# Patient Record
Sex: Female | Born: 1940 | Race: White | Hispanic: No | Smoking: Never smoker
Health system: Southern US, Community
[De-identification: ages and names within clinical notes are randomized; demographics above are authoritative.]

## PROBLEM LIST (undated history)

## (undated) DIAGNOSIS — Z9889 Other specified postprocedural states: Secondary | ICD-10-CM

## (undated) DIAGNOSIS — E039 Hypothyroidism, unspecified: Secondary | ICD-10-CM

## (undated) DIAGNOSIS — Z531 Procedure and treatment not carried out because of patient's decision for reasons of belief and group pressure: Secondary | ICD-10-CM

## (undated) DIAGNOSIS — Z87440 Personal history of urinary (tract) infections: Secondary | ICD-10-CM

## (undated) DIAGNOSIS — M1990 Unspecified osteoarthritis, unspecified site: Secondary | ICD-10-CM

## (undated) DIAGNOSIS — R9431 Abnormal electrocardiogram [ECG] [EKG]: Secondary | ICD-10-CM

## (undated) DIAGNOSIS — R7303 Prediabetes: Secondary | ICD-10-CM

## (undated) DIAGNOSIS — J302 Other seasonal allergic rhinitis: Secondary | ICD-10-CM

## (undated) DIAGNOSIS — IMO0001 Reserved for inherently not codable concepts without codable children: Secondary | ICD-10-CM

## (undated) DIAGNOSIS — R109 Unspecified abdominal pain: Secondary | ICD-10-CM

## (undated) DIAGNOSIS — C4492 Squamous cell carcinoma of skin, unspecified: Secondary | ICD-10-CM

## (undated) DIAGNOSIS — R112 Nausea with vomiting, unspecified: Secondary | ICD-10-CM

## (undated) DIAGNOSIS — F411 Generalized anxiety disorder: Secondary | ICD-10-CM

## (undated) DIAGNOSIS — B029 Zoster without complications: Secondary | ICD-10-CM

## (undated) DIAGNOSIS — M8580 Other specified disorders of bone density and structure, unspecified site: Secondary | ICD-10-CM

## (undated) DIAGNOSIS — F329 Major depressive disorder, single episode, unspecified: Secondary | ICD-10-CM

## (undated) DIAGNOSIS — G43909 Migraine, unspecified, not intractable, without status migrainosus: Secondary | ICD-10-CM

## (undated) DIAGNOSIS — Z889 Allergy status to unspecified drugs, medicaments and biological substances status: Secondary | ICD-10-CM

## (undated) DIAGNOSIS — E559 Vitamin D deficiency, unspecified: Secondary | ICD-10-CM

## (undated) DIAGNOSIS — K219 Gastro-esophageal reflux disease without esophagitis: Secondary | ICD-10-CM

## (undated) DIAGNOSIS — M179 Osteoarthritis of knee, unspecified: Secondary | ICD-10-CM

## (undated) DIAGNOSIS — Z8701 Personal history of pneumonia (recurrent): Secondary | ICD-10-CM

## (undated) DIAGNOSIS — T7840XA Allergy, unspecified, initial encounter: Secondary | ICD-10-CM

## (undated) DIAGNOSIS — E785 Hyperlipidemia, unspecified: Secondary | ICD-10-CM

## (undated) DIAGNOSIS — D126 Benign neoplasm of colon, unspecified: Secondary | ICD-10-CM

## (undated) DIAGNOSIS — M1710 Unilateral primary osteoarthritis, unspecified knee: Secondary | ICD-10-CM

## (undated) DIAGNOSIS — F32A Depression, unspecified: Secondary | ICD-10-CM

## (undated) DIAGNOSIS — I880 Nonspecific mesenteric lymphadenitis: Secondary | ICD-10-CM

## (undated) HISTORY — DX: Nonspecific mesenteric lymphadenitis: I88.0

## (undated) HISTORY — DX: Generalized anxiety disorder: F41.1

## (undated) HISTORY — PX: BREAST ENHANCEMENT SURGERY: SHX7

## (undated) HISTORY — DX: Depression, unspecified: F32.A

## (undated) HISTORY — DX: Major depressive disorder, single episode, unspecified: F32.9

## (undated) HISTORY — DX: Squamous cell carcinoma of skin, unspecified: C44.92

## (undated) HISTORY — DX: Nausea with vomiting, unspecified: R11.2

## (undated) HISTORY — DX: Abnormal electrocardiogram (ECG) (EKG): R94.31

## (undated) HISTORY — DX: Osteoarthritis of knee, unspecified: M17.9

## (undated) HISTORY — DX: Benign neoplasm of colon, unspecified: D12.6

## (undated) HISTORY — DX: Zoster without complications: B02.9

## (undated) HISTORY — DX: Other specified disorders of bone density and structure, unspecified site: M85.80

## (undated) HISTORY — DX: Unspecified osteoarthritis, unspecified site: M19.90

## (undated) HISTORY — PX: EYE SURGERY: SHX253

## (undated) HISTORY — DX: Personal history of pneumonia (recurrent): Z87.01

## (undated) HISTORY — DX: Procedure and treatment not carried out because of patient's decision for reasons of belief and group pressure: Z53.1

## (undated) HISTORY — DX: Vitamin D deficiency, unspecified: E55.9

## (undated) HISTORY — DX: Unspecified abdominal pain: R10.9

## (undated) HISTORY — DX: Other specified postprocedural states: Z98.890

## (undated) HISTORY — DX: Hyperlipidemia, unspecified: E78.5

## (undated) HISTORY — DX: Unilateral primary osteoarthritis, unspecified knee: M17.10

## (undated) HISTORY — PX: AUGMENTATION MAMMAPLASTY: SUR837

## (undated) HISTORY — DX: Allergy, unspecified, initial encounter: T78.40XA

## (undated) HISTORY — DX: Migraine, unspecified, not intractable, without status migrainosus: G43.909

## (undated) HISTORY — DX: Other seasonal allergic rhinitis: J30.2

## (undated) HISTORY — DX: Personal history of urinary (tract) infections: Z87.440

---
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
## 1978-03-07 HISTORY — PX: PARTIAL HYSTERECTOMY: SHX80

## 2000-03-07 HISTORY — PX: COSMETIC SURGERY: SHX468

## 2005-03-07 HISTORY — PX: COLONOSCOPY: SHX174

## 2007-03-08 LAB — HM COLONOSCOPY: HM Colonoscopy: NORMAL

## 2008-06-05 HISTORY — PX: OTHER SURGICAL HISTORY: SHX169

## 2009-03-07 HISTORY — PX: MOHS SURGERY: SUR867

## 2010-06-22 LAB — VITAMIN D 25 HYDROXY (VIT D DEFICIENCY, FRACTURES): Vit D, 25-Hydroxy: 27.9

## 2011-03-08 HISTORY — PX: CATARACT EXTRACTION: SUR2

## 2011-03-08 HISTORY — PX: BUNIONECTOMY: SHX129

## 2011-03-09 DIAGNOSIS — J3089 Other allergic rhinitis: Secondary | ICD-10-CM | POA: Diagnosis not present

## 2011-03-11 DIAGNOSIS — J3089 Other allergic rhinitis: Secondary | ICD-10-CM | POA: Diagnosis not present

## 2011-03-15 DIAGNOSIS — M779 Enthesopathy, unspecified: Secondary | ICD-10-CM | POA: Diagnosis not present

## 2011-03-17 DIAGNOSIS — D239 Other benign neoplasm of skin, unspecified: Secondary | ICD-10-CM | POA: Diagnosis not present

## 2011-03-17 DIAGNOSIS — L570 Actinic keratosis: Secondary | ICD-10-CM | POA: Diagnosis not present

## 2011-03-17 DIAGNOSIS — L719 Rosacea, unspecified: Secondary | ICD-10-CM | POA: Diagnosis not present

## 2011-03-23 DIAGNOSIS — J309 Allergic rhinitis, unspecified: Secondary | ICD-10-CM | POA: Diagnosis not present

## 2011-03-23 DIAGNOSIS — H1045 Other chronic allergic conjunctivitis: Secondary | ICD-10-CM | POA: Diagnosis not present

## 2011-03-28 DIAGNOSIS — J3089 Other allergic rhinitis: Secondary | ICD-10-CM | POA: Diagnosis not present

## 2011-03-29 DIAGNOSIS — Z01818 Encounter for other preprocedural examination: Secondary | ICD-10-CM | POA: Diagnosis not present

## 2011-03-29 DIAGNOSIS — M2010 Hallux valgus (acquired), unspecified foot: Secondary | ICD-10-CM | POA: Diagnosis not present

## 2011-04-05 DIAGNOSIS — M2010 Hallux valgus (acquired), unspecified foot: Secondary | ICD-10-CM | POA: Diagnosis not present

## 2011-04-12 DIAGNOSIS — J309 Allergic rhinitis, unspecified: Secondary | ICD-10-CM | POA: Diagnosis not present

## 2011-04-14 DIAGNOSIS — J309 Allergic rhinitis, unspecified: Secondary | ICD-10-CM | POA: Diagnosis not present

## 2011-04-19 DIAGNOSIS — J309 Allergic rhinitis, unspecified: Secondary | ICD-10-CM | POA: Diagnosis not present

## 2011-04-21 DIAGNOSIS — J309 Allergic rhinitis, unspecified: Secondary | ICD-10-CM | POA: Diagnosis not present

## 2011-04-26 DIAGNOSIS — M2010 Hallux valgus (acquired), unspecified foot: Secondary | ICD-10-CM | POA: Diagnosis not present

## 2011-04-26 DIAGNOSIS — J309 Allergic rhinitis, unspecified: Secondary | ICD-10-CM | POA: Diagnosis not present

## 2011-04-28 DIAGNOSIS — J309 Allergic rhinitis, unspecified: Secondary | ICD-10-CM | POA: Diagnosis not present

## 2011-05-03 DIAGNOSIS — J309 Allergic rhinitis, unspecified: Secondary | ICD-10-CM | POA: Diagnosis not present

## 2011-05-06 DIAGNOSIS — J309 Allergic rhinitis, unspecified: Secondary | ICD-10-CM | POA: Diagnosis not present

## 2011-05-10 DIAGNOSIS — J309 Allergic rhinitis, unspecified: Secondary | ICD-10-CM | POA: Diagnosis not present

## 2011-05-12 DIAGNOSIS — J309 Allergic rhinitis, unspecified: Secondary | ICD-10-CM | POA: Diagnosis not present

## 2011-05-17 DIAGNOSIS — J309 Allergic rhinitis, unspecified: Secondary | ICD-10-CM | POA: Diagnosis not present

## 2011-05-17 DIAGNOSIS — M2010 Hallux valgus (acquired), unspecified foot: Secondary | ICD-10-CM | POA: Diagnosis not present

## 2011-05-19 DIAGNOSIS — J309 Allergic rhinitis, unspecified: Secondary | ICD-10-CM | POA: Diagnosis not present

## 2011-05-24 DIAGNOSIS — J309 Allergic rhinitis, unspecified: Secondary | ICD-10-CM | POA: Diagnosis not present

## 2011-06-24 DIAGNOSIS — Z79899 Other long term (current) drug therapy: Secondary | ICD-10-CM | POA: Diagnosis not present

## 2011-06-24 DIAGNOSIS — E7800 Pure hypercholesterolemia, unspecified: Secondary | ICD-10-CM | POA: Diagnosis not present

## 2011-06-24 DIAGNOSIS — M25569 Pain in unspecified knee: Secondary | ICD-10-CM | POA: Diagnosis not present

## 2011-06-24 DIAGNOSIS — F341 Dysthymic disorder: Secondary | ICD-10-CM | POA: Diagnosis not present

## 2011-06-24 DIAGNOSIS — M159 Polyosteoarthritis, unspecified: Secondary | ICD-10-CM | POA: Diagnosis not present

## 2011-06-24 LAB — CBC
WBC: 4.6
platelet count: 272

## 2011-06-24 LAB — COMPREHENSIVE METABOLIC PANEL
Alkaline Phosphatase: 146 U/L
Total Bilirubin: 0.5 mg/dL

## 2011-06-24 LAB — LIPID PANEL
Cholesterol: 282 mg/dL — AB (ref 0–200)
Direct LDL: 194
HDL: 82 mg/dL — AB (ref 35–70)
Triglyceride fasting, serum: 97

## 2011-06-28 DIAGNOSIS — J309 Allergic rhinitis, unspecified: Secondary | ICD-10-CM | POA: Diagnosis not present

## 2011-07-01 DIAGNOSIS — J309 Allergic rhinitis, unspecified: Secondary | ICD-10-CM | POA: Diagnosis not present

## 2011-07-05 DIAGNOSIS — M2010 Hallux valgus (acquired), unspecified foot: Secondary | ICD-10-CM | POA: Diagnosis not present

## 2011-07-05 DIAGNOSIS — Z01818 Encounter for other preprocedural examination: Secondary | ICD-10-CM | POA: Diagnosis not present

## 2011-07-12 DIAGNOSIS — J309 Allergic rhinitis, unspecified: Secondary | ICD-10-CM | POA: Diagnosis not present

## 2011-07-15 DIAGNOSIS — J309 Allergic rhinitis, unspecified: Secondary | ICD-10-CM | POA: Diagnosis not present

## 2011-07-19 DIAGNOSIS — J309 Allergic rhinitis, unspecified: Secondary | ICD-10-CM | POA: Diagnosis not present

## 2011-07-20 DIAGNOSIS — R269 Unspecified abnormalities of gait and mobility: Secondary | ICD-10-CM | POA: Diagnosis not present

## 2011-07-20 DIAGNOSIS — IMO0002 Reserved for concepts with insufficient information to code with codable children: Secondary | ICD-10-CM | POA: Diagnosis not present

## 2011-07-20 DIAGNOSIS — M25569 Pain in unspecified knee: Secondary | ICD-10-CM | POA: Diagnosis not present

## 2011-07-22 DIAGNOSIS — J309 Allergic rhinitis, unspecified: Secondary | ICD-10-CM | POA: Diagnosis not present

## 2011-07-28 DIAGNOSIS — J309 Allergic rhinitis, unspecified: Secondary | ICD-10-CM | POA: Diagnosis not present

## 2011-08-09 DIAGNOSIS — M2010 Hallux valgus (acquired), unspecified foot: Secondary | ICD-10-CM | POA: Diagnosis not present

## 2011-08-09 DIAGNOSIS — J309 Allergic rhinitis, unspecified: Secondary | ICD-10-CM | POA: Diagnosis not present

## 2011-08-12 DIAGNOSIS — J309 Allergic rhinitis, unspecified: Secondary | ICD-10-CM | POA: Diagnosis not present

## 2011-08-16 DIAGNOSIS — J309 Allergic rhinitis, unspecified: Secondary | ICD-10-CM | POA: Diagnosis not present

## 2011-08-18 DIAGNOSIS — J309 Allergic rhinitis, unspecified: Secondary | ICD-10-CM | POA: Diagnosis not present

## 2011-08-23 DIAGNOSIS — J309 Allergic rhinitis, unspecified: Secondary | ICD-10-CM | POA: Diagnosis not present

## 2011-08-24 DIAGNOSIS — IMO0002 Reserved for concepts with insufficient information to code with codable children: Secondary | ICD-10-CM | POA: Diagnosis not present

## 2011-08-29 ENCOUNTER — Ambulatory Visit: Payer: Self-pay | Admitting: Orthopedic Surgery

## 2011-08-29 DIAGNOSIS — M1710 Unilateral primary osteoarthritis, unspecified knee: Secondary | ICD-10-CM | POA: Diagnosis not present

## 2011-08-29 DIAGNOSIS — M25569 Pain in unspecified knee: Secondary | ICD-10-CM | POA: Diagnosis not present

## 2011-08-29 DIAGNOSIS — M25469 Effusion, unspecified knee: Secondary | ICD-10-CM | POA: Diagnosis not present

## 2011-08-29 DIAGNOSIS — M7120 Synovial cyst of popliteal space [Baker], unspecified knee: Secondary | ICD-10-CM | POA: Diagnosis not present

## 2011-09-01 DIAGNOSIS — R269 Unspecified abnormalities of gait and mobility: Secondary | ICD-10-CM | POA: Diagnosis not present

## 2011-09-01 DIAGNOSIS — J309 Allergic rhinitis, unspecified: Secondary | ICD-10-CM | POA: Diagnosis not present

## 2011-09-01 DIAGNOSIS — M1710 Unilateral primary osteoarthritis, unspecified knee: Secondary | ICD-10-CM | POA: Diagnosis not present

## 2011-09-09 DIAGNOSIS — J309 Allergic rhinitis, unspecified: Secondary | ICD-10-CM | POA: Diagnosis not present

## 2011-09-14 DIAGNOSIS — M1710 Unilateral primary osteoarthritis, unspecified knee: Secondary | ICD-10-CM | POA: Diagnosis not present

## 2011-09-15 DIAGNOSIS — J309 Allergic rhinitis, unspecified: Secondary | ICD-10-CM | POA: Diagnosis not present

## 2011-09-20 DIAGNOSIS — J309 Allergic rhinitis, unspecified: Secondary | ICD-10-CM | POA: Diagnosis not present

## 2011-09-21 DIAGNOSIS — M1710 Unilateral primary osteoarthritis, unspecified knee: Secondary | ICD-10-CM | POA: Diagnosis not present

## 2011-09-22 DIAGNOSIS — J309 Allergic rhinitis, unspecified: Secondary | ICD-10-CM | POA: Diagnosis not present

## 2011-09-22 DIAGNOSIS — J3089 Other allergic rhinitis: Secondary | ICD-10-CM | POA: Diagnosis not present

## 2011-09-22 DIAGNOSIS — H1045 Other chronic allergic conjunctivitis: Secondary | ICD-10-CM | POA: Diagnosis not present

## 2011-09-27 DIAGNOSIS — M779 Enthesopathy, unspecified: Secondary | ICD-10-CM | POA: Diagnosis not present

## 2011-09-28 DIAGNOSIS — M1710 Unilateral primary osteoarthritis, unspecified knee: Secondary | ICD-10-CM | POA: Diagnosis not present

## 2011-09-29 DIAGNOSIS — J309 Allergic rhinitis, unspecified: Secondary | ICD-10-CM | POA: Diagnosis not present

## 2011-10-18 DIAGNOSIS — R609 Edema, unspecified: Secondary | ICD-10-CM | POA: Diagnosis not present

## 2011-10-18 DIAGNOSIS — J309 Allergic rhinitis, unspecified: Secondary | ICD-10-CM | POA: Diagnosis not present

## 2011-10-19 DIAGNOSIS — H2510 Age-related nuclear cataract, unspecified eye: Secondary | ICD-10-CM | POA: Diagnosis not present

## 2011-10-19 DIAGNOSIS — M1710 Unilateral primary osteoarthritis, unspecified knee: Secondary | ICD-10-CM | POA: Diagnosis not present

## 2011-10-25 DIAGNOSIS — J309 Allergic rhinitis, unspecified: Secondary | ICD-10-CM | POA: Diagnosis not present

## 2011-10-28 DIAGNOSIS — D239 Other benign neoplasm of skin, unspecified: Secondary | ICD-10-CM | POA: Diagnosis not present

## 2011-10-28 DIAGNOSIS — L570 Actinic keratosis: Secondary | ICD-10-CM | POA: Diagnosis not present

## 2011-10-28 DIAGNOSIS — Z85828 Personal history of other malignant neoplasm of skin: Secondary | ICD-10-CM | POA: Diagnosis not present

## 2011-10-28 DIAGNOSIS — C44721 Squamous cell carcinoma of skin of unspecified lower limb, including hip: Secondary | ICD-10-CM | POA: Diagnosis not present

## 2011-10-28 DIAGNOSIS — L821 Other seborrheic keratosis: Secondary | ICD-10-CM | POA: Diagnosis not present

## 2011-11-04 DIAGNOSIS — H2510 Age-related nuclear cataract, unspecified eye: Secondary | ICD-10-CM | POA: Diagnosis not present

## 2011-11-08 DIAGNOSIS — J309 Allergic rhinitis, unspecified: Secondary | ICD-10-CM | POA: Diagnosis not present

## 2011-11-09 DIAGNOSIS — R635 Abnormal weight gain: Secondary | ICD-10-CM | POA: Diagnosis not present

## 2011-11-09 DIAGNOSIS — M1990 Unspecified osteoarthritis, unspecified site: Secondary | ICD-10-CM | POA: Diagnosis not present

## 2011-11-09 DIAGNOSIS — N760 Acute vaginitis: Secondary | ICD-10-CM | POA: Diagnosis not present

## 2011-11-09 LAB — T4, FREE: T4,Free (Direct): 0.86

## 2011-11-14 ENCOUNTER — Ambulatory Visit: Payer: Self-pay | Admitting: Ophthalmology

## 2011-11-14 DIAGNOSIS — Z01810 Encounter for preprocedural cardiovascular examination: Secondary | ICD-10-CM | POA: Diagnosis not present

## 2011-11-14 DIAGNOSIS — I499 Cardiac arrhythmia, unspecified: Secondary | ICD-10-CM

## 2011-11-14 DIAGNOSIS — H2510 Age-related nuclear cataract, unspecified eye: Secondary | ICD-10-CM | POA: Diagnosis not present

## 2011-11-15 DIAGNOSIS — J309 Allergic rhinitis, unspecified: Secondary | ICD-10-CM | POA: Diagnosis not present

## 2011-11-22 ENCOUNTER — Ambulatory Visit: Payer: Self-pay | Admitting: Ophthalmology

## 2011-11-22 DIAGNOSIS — Z85828 Personal history of other malignant neoplasm of skin: Secondary | ICD-10-CM | POA: Diagnosis not present

## 2011-11-22 DIAGNOSIS — H269 Unspecified cataract: Secondary | ICD-10-CM | POA: Diagnosis not present

## 2011-11-22 DIAGNOSIS — M1710 Unilateral primary osteoarthritis, unspecified knee: Secondary | ICD-10-CM | POA: Diagnosis not present

## 2011-11-22 DIAGNOSIS — Z9109 Other allergy status, other than to drugs and biological substances: Secondary | ICD-10-CM | POA: Diagnosis not present

## 2011-11-22 DIAGNOSIS — Z79899 Other long term (current) drug therapy: Secondary | ICD-10-CM | POA: Diagnosis not present

## 2011-11-22 DIAGNOSIS — G43909 Migraine, unspecified, not intractable, without status migrainosus: Secondary | ICD-10-CM | POA: Diagnosis not present

## 2011-11-22 DIAGNOSIS — H2510 Age-related nuclear cataract, unspecified eye: Secondary | ICD-10-CM | POA: Diagnosis not present

## 2011-11-22 DIAGNOSIS — I498 Other specified cardiac arrhythmias: Secondary | ICD-10-CM | POA: Diagnosis not present

## 2011-11-22 DIAGNOSIS — F329 Major depressive disorder, single episode, unspecified: Secondary | ICD-10-CM | POA: Diagnosis not present

## 2011-12-01 ENCOUNTER — Ambulatory Visit (INDEPENDENT_AMBULATORY_CARE_PROVIDER_SITE_OTHER): Payer: Medicare Other | Admitting: Family Medicine

## 2011-12-01 ENCOUNTER — Encounter: Payer: Self-pay | Admitting: Family Medicine

## 2011-12-01 VITALS — BP 122/86 | HR 78 | Temp 98.2°F | Ht 65.5 in | Wt 154.2 lb

## 2011-12-01 DIAGNOSIS — G43909 Migraine, unspecified, not intractable, without status migrainosus: Secondary | ICD-10-CM | POA: Insufficient documentation

## 2011-12-01 DIAGNOSIS — Z8249 Family history of ischemic heart disease and other diseases of the circulatory system: Secondary | ICD-10-CM

## 2011-12-01 DIAGNOSIS — E663 Overweight: Secondary | ICD-10-CM

## 2011-12-01 DIAGNOSIS — F329 Major depressive disorder, single episode, unspecified: Secondary | ICD-10-CM | POA: Insufficient documentation

## 2011-12-01 DIAGNOSIS — R9431 Abnormal electrocardiogram [ECG] [EKG]: Secondary | ICD-10-CM | POA: Diagnosis not present

## 2011-12-01 DIAGNOSIS — F32A Depression, unspecified: Secondary | ICD-10-CM

## 2011-12-01 DIAGNOSIS — M129 Arthropathy, unspecified: Secondary | ICD-10-CM

## 2011-12-01 DIAGNOSIS — F331 Major depressive disorder, recurrent, moderate: Secondary | ICD-10-CM | POA: Insufficient documentation

## 2011-12-01 DIAGNOSIS — Z23 Encounter for immunization: Secondary | ICD-10-CM | POA: Diagnosis not present

## 2011-12-01 DIAGNOSIS — M1990 Unspecified osteoarthritis, unspecified site: Secondary | ICD-10-CM

## 2011-12-01 DIAGNOSIS — F3289 Other specified depressive episodes: Secondary | ICD-10-CM

## 2011-12-01 LAB — BASIC METABOLIC PANEL
BUN: 15 mg/dL (ref 6–23)
CO2: 29 mEq/L (ref 19–32)
Chloride: 102 mEq/L (ref 96–112)
GFR: 82.28 mL/min (ref >60.00)
Glucose, Bld: 101 mg/dL — ABNORMAL HIGH (ref 70–99)
Potassium: 4 mEq/L (ref 3.5–5.1)
Sodium: 140 mEq/L (ref 135–145)

## 2011-12-01 LAB — LIPID PANEL: VLDL: 20.4 mg/dL (ref 0.0–40.0)

## 2011-12-01 NOTE — Progress Notes (Signed)
Subjective:    Patient ID: Kristin Kramer, female    DOB: [birth year], 71 y.o.   MRN: [id number]  HPI CC: new pt to establish  Recently moved from [facility].  Pt has sent fax requesting records from prior PCP.  Had prior EKG (about 2002).  Abnormal EKG recently done preop for cataract surgery.  Told needed f/u with PCP.  Scheduled to have R knee replacement 01/02/2012.  Severe osteoarthritis.  Ortho - Dr. Thurston Hole.  Wants abnl EKG addressed before then.  Prior to knee problem, did walk 57min-1hr 3x/wk.  Never SOB with exertion.  Was able to walk up 4 flights of stairs and did have some SOB at 4th flight.  Denies chest pain/tightness, SOB, current palpitations, HA or dizziness.  H/o depression - daughter died in car accident, husband died with lung cancer, son died in house fire, then both parents died.  This all happened in last 5-10 yrs.  Started on meds around this time.  Prior on lexapro, celexa, wellbutrin.  Recent skin cancer removed R leg.   Wt Readings from Last 3 Encounters:  12/01/11 154 lb 4 oz (69.967 kg)   Body mass index is 25.28 kg/(m^2).  Multiple surgeries in past.  Does get ill with general anesthesia - nausea/vomiting - but not major reaction in past.  No falls in last year. Denies anhedonia, depression, anxiety or sadness.  Lives alone.  Brother lives nearby. Occupation: retired, was Diplomatic Services operational officer and housewife Edu: 1 yr college Activity: limited by knee Diet: good water, fruits/vegetables daily  Preventative: Last medicare wellness visit 06/2011 mammo 2012 - normal Colonoscopy 2009 -  WNL, good for 10 yrs according to patient.  Medications and allergies reviewed and updated in chart.  Past histories reviewed and updated if relevant as below. There is no problem list on file for this patient.  Past Medical History  Diagnosis Date  . Seasonal allergies     cats, dust, mold, roaches  . Depression     prior on lexapro, celexa, wellbutrin  . Migraines   .  Squamous cell skin cancer   . Abnormal EKG    Past Surgical History  Procedure Date  . Tonsillectomy and adenoidectomy 1945  . Abdominal hysterectomy   . Bunionectomy 2013    R and L foot  . Cataract extraction 2013    R, pending L  . Cosmetic surgery 2002    face lift   History  Substance Use Topics  . Smoking status: Never Smoker   . Smokeless tobacco: Never Used  . Alcohol Use: Yes     occasional   Family History  Problem Relation Age of Onset  . Cancer Mother     lung, smoker  . Cancer Father     lung, smoker  . Cancer Maternal Grandmother 48    leukemia  . Coronary artery disease Paternal Grandmother 58    sudden cardiac death  . Diabetes Neg Hx   . Stroke Neg Hx    No Known Allergies Current Outpatient Prescriptions on File Prior to Visit  Medication Sig Dispense Refill  . calcium carbonate (OS-CAL) 600 MG TABS Take 600 mg by mouth 2 (two) times daily with a meal.      . DULoxetine (CYMBALTA) 20 MG capsule Take 20 mg by mouth daily.         Review of Systems  Constitutional: Negative for fever, chills, activity change, appetite change, fatigue and unexpected weight change.  HENT: Negative for hearing loss and neck  pain.   Eyes: Negative for visual disturbance.  Respiratory: Negative for cough, chest tightness, shortness of breath and wheezing.   Cardiovascular: Negative for chest pain, palpitations and leg swelling.  Gastrointestinal: Negative for nausea, vomiting, abdominal pain, diarrhea, constipation, blood in stool and abdominal distention.  Genitourinary: Negative for hematuria and difficulty urinating.  Musculoskeletal: Negative for myalgias and arthralgias.  Skin: Negative for rash.  Neurological: Negative for dizziness, seizures, syncope and headaches.  Hematological: Does not bruise/bleed easily.  Psychiatric/Behavioral: Negative for dysphoric mood. The patient is not nervous/anxious.        Objective:   Physical Exam  Nursing note and vitals  reviewed. Constitutional: She is oriented to person, place, and time. She appears well-developed and well-nourished. No distress.  HENT:  Head: Normocephalic and atraumatic.  Right Ear: Hearing, tympanic membrane, external ear and ear canal normal.  Left Ear: Hearing, tympanic membrane, external ear and ear canal normal.  Nose: Nose normal.  Mouth/Throat: Oropharynx is clear and moist. No oropharyngeal exudate.  Eyes: Conjunctivae normal and EOM are normal. Pupils are equal, round, and reactive to light. No scleral icterus.  Neck: Normal range of motion. Neck supple.  Cardiovascular: Normal rate, regular rhythm, normal heart sounds and intact distal pulses.   No murmur heard. Pulses:      Radial pulses are 2+ on the right side, and 2+ on the left side.  Pulmonary/Chest: Effort normal and breath sounds normal. No respiratory distress. She has no wheezes. She has no rales.  Abdominal: Soft. Bowel sounds are normal. She exhibits no distension and no mass. There is no tenderness. There is no rebound and no guarding.  Musculoskeletal: Normal range of motion. She exhibits no edema.  Lymphadenopathy:    She has no cervical adenopathy.  Neurological: She is alert and oriented to person, place, and time.       CN grossly intact, station and gait intact  Skin: Skin is warm and dry. No rash noted.  Psychiatric: She has a normal mood and affect. Her behavior is normal. Judgment and thought content normal.       Assessment & Plan:

## 2011-12-01 NOTE — Assessment & Plan Note (Signed)
Chronic, pending TKR.

## 2011-12-01 NOTE — Assessment & Plan Note (Signed)
Rechecked today and compared to prior EKG earlier this month. As persistently abnormal and pending R TKR, will refer to cards for further evaluation. Overall anticipate low risk.  EKG - NSR rate 70, LAD, normal intervals, no acute ST/T changes, LAFB

## 2011-12-01 NOTE — Assessment & Plan Note (Signed)
Chronic, stable on cymbalta

## 2011-12-01 NOTE — Patient Instructions (Addendum)
Flu and pneumonia shot today. Call your insurance about the shingles shot to see if it is covered or how much it would cost and where is cheaper (here or pharmacy).  If you want to receive here, call for nurse visit. EKG today - staying a bit abnormal.  Pass by Marion's office for referral to cardiologist for further evaluation prior to knee surgery. Blood work today.

## 2011-12-02 ENCOUNTER — Encounter: Payer: Self-pay | Admitting: *Deleted

## 2011-12-02 DIAGNOSIS — J309 Allergic rhinitis, unspecified: Secondary | ICD-10-CM | POA: Diagnosis not present

## 2011-12-08 DIAGNOSIS — J309 Allergic rhinitis, unspecified: Secondary | ICD-10-CM | POA: Diagnosis not present

## 2011-12-12 ENCOUNTER — Ambulatory Visit (INDEPENDENT_AMBULATORY_CARE_PROVIDER_SITE_OTHER): Payer: Medicare Other | Admitting: Cardiovascular Disease

## 2011-12-12 ENCOUNTER — Encounter: Payer: Self-pay | Admitting: Cardiovascular Disease

## 2011-12-12 VITALS — BP 118/82 | HR 78 | Ht 65.5 in | Wt 150.2 lb

## 2011-12-12 DIAGNOSIS — E785 Hyperlipidemia, unspecified: Secondary | ICD-10-CM | POA: Diagnosis not present

## 2011-12-12 DIAGNOSIS — R9431 Abnormal electrocardiogram [ECG] [EKG]: Secondary | ICD-10-CM | POA: Diagnosis not present

## 2011-12-12 NOTE — Patient Instructions (Addendum)
You are doing well. No medication changes were made.  Please stay on red yeast rice  Please call us if you have new issues that need to be addressed before your next appt.  Your physician wants you to follow-up in: 6 months.  You will receive a reminder letter in the mail two months in advance. If you don't receive a letter, please call our office to schedule the follow-up appointment.

## 2011-12-12 NOTE — Assessment & Plan Note (Signed)
EKG has nonspecific findings including right bundle branch block, incomplete, and left anterior fascicular block. Given her good exercise tolerance, no prior cardiac history, no prior workup is needed. She would be acceptable risk for upcoming surgical procedure, total knee replacement.

## 2011-12-12 NOTE — Assessment & Plan Note (Addendum)
We have discussed her cholesterol with her. 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 She has started red yeast rice on her own. We have suggested she recheck her cholesterol numbers in several months time. There are other alternatives than statins for cholesterol reduction if needed. These would include WelChol, zetia, or a fenofibrate.

## 2011-12-12 NOTE — Progress Notes (Signed)
Patient ID: Kristin Kramer, female    DOB: 03/23/1940, 71 y.o.   MRN: [id number]  HPI Comments: Kristin Kramer is a very pleasant 71 year old woman with a history of hyperlipidemia, prior foot surgery, osteoarthritis of her knees who is scheduled to have total knee replacement at [facility] on 01/02/2012. She presents for evaluation of her abnormal EKG and preoperative assessment.  She reports that she has no prior cardiac history. She denies any symptoms of shortness of breath or chest pain. She is active at baseline but limited by her recent knee pain. She reports that she has bone-on-bone. She denies any lightheadedness, dizziness, lower external edema. She is able to do all of her ADLs. She lives by herself and is very independent. Prior to her knee discomfort, she was exercising on a regular basis without symptoms.  Recent EKGs show normal sinus rhythm with incomplete right bundle branch block, left anterior fascicular block. This was seen several weeks ago and again today. Heart rate today 78 beats per minute  She reports that her father passed away at age 33 from lung cancer, mother at age 34 for lung cancer. They were both smokers   Outpatient Encounter Prescriptions as of 12/12/2011  Medication Sig Dispense Refill  . acetaminophen (TYLENOL) 325 MG tablet Take 650 mg by mouth as needed.      . calcium carbonate (OS-CAL) 600 MG TABS Take 600 mg by mouth 2 (two) times daily with a meal.      . Cholecalciferol (VITAMIN D-3) 5000 UNITS TABS Take 5,000 Units by mouth daily.      . DULoxetine (CYMBALTA) 20 MG capsule Take 20 mg by mouth daily.      Marland Kitchen LORazepam (ATIVAN) 0.5 MG tablet Take 0.5 mg by mouth at bedtime.      . traMADol (ULTRAM) 50 MG tablet Take 50 mg by mouth every 6 (six) hours as needed.         Review of Systems  Constitutional: Negative.   HENT: Negative.   Eyes: Negative.   Respiratory: Negative.   Cardiovascular: Negative.   Gastrointestinal: Negative.     Musculoskeletal: Positive for joint swelling and arthralgias.  Skin: Negative.   Neurological: Negative.   Hematological: Negative.   Psychiatric/Behavioral: Negative.   All other systems reviewed and are negative.    BP 118/82  Pulse 78  Ht 5' 5.5" (1.664 m)  Wt 150 lb 4 oz (68.153 kg)  BMI 24.62 kg/m2  Physical Exam  Nursing note and vitals reviewed. Constitutional: She is oriented to person, place, and time. She appears well-developed and well-nourished.  HENT:  Head: Normocephalic.  Nose: Nose normal.  Mouth/Throat: Oropharynx is clear and moist.  Eyes: Conjunctivae normal are normal. Pupils are equal, round, and reactive to light.  Neck: Normal range of motion. Neck supple. No JVD present.  Cardiovascular: Normal rate, regular rhythm, S1 normal, S2 normal, normal heart sounds and intact distal pulses.  Exam reveals no gallop and no friction rub.   No murmur heard. Pulmonary/Chest: Effort normal and breath sounds normal. No respiratory distress. She has no wheezes. She has no rales. She exhibits no tenderness.  Abdominal: Soft. Bowel sounds are normal. She exhibits no distension. There is no tenderness.  Musculoskeletal: Normal range of motion. She exhibits no edema and no tenderness.  Lymphadenopathy:    She has no cervical adenopathy.  Neurological: She is alert and oriented to person, place, and time. Coordination normal.  Skin: Skin is warm and dry. No rash noted.  No erythema.  Psychiatric: She has a normal mood and affect. Her behavior is normal. Judgment and thought content normal.         Assessment and Plan

## 2011-12-15 DIAGNOSIS — H2510 Age-related nuclear cataract, unspecified eye: Secondary | ICD-10-CM | POA: Diagnosis not present

## 2011-12-16 DIAGNOSIS — J309 Allergic rhinitis, unspecified: Secondary | ICD-10-CM | POA: Diagnosis not present

## 2011-12-19 ENCOUNTER — Encounter (HOSPITAL_COMMUNITY): Payer: Self-pay | Admitting: Pharmacy Technician

## 2011-12-20 ENCOUNTER — Ambulatory Visit: Payer: Self-pay | Admitting: Ophthalmology

## 2011-12-20 DIAGNOSIS — F329 Major depressive disorder, single episode, unspecified: Secondary | ICD-10-CM | POA: Diagnosis not present

## 2011-12-20 DIAGNOSIS — H269 Unspecified cataract: Secondary | ICD-10-CM | POA: Diagnosis not present

## 2011-12-20 DIAGNOSIS — M1710 Unilateral primary osteoarthritis, unspecified knee: Secondary | ICD-10-CM | POA: Diagnosis not present

## 2011-12-20 DIAGNOSIS — H2510 Age-related nuclear cataract, unspecified eye: Secondary | ICD-10-CM | POA: Diagnosis not present

## 2011-12-20 DIAGNOSIS — G43909 Migraine, unspecified, not intractable, without status migrainosus: Secondary | ICD-10-CM | POA: Diagnosis not present

## 2011-12-20 DIAGNOSIS — Z85828 Personal history of other malignant neoplasm of skin: Secondary | ICD-10-CM | POA: Diagnosis not present

## 2011-12-20 DIAGNOSIS — Z79899 Other long term (current) drug therapy: Secondary | ICD-10-CM | POA: Diagnosis not present

## 2011-12-21 ENCOUNTER — Encounter: Payer: Self-pay | Admitting: Physician Assistant

## 2011-12-21 ENCOUNTER — Other Ambulatory Visit: Payer: Self-pay | Admitting: Physician Assistant

## 2011-12-21 DIAGNOSIS — M1711 Unilateral primary osteoarthritis, right knee: Secondary | ICD-10-CM

## 2011-12-21 DIAGNOSIS — M1710 Unilateral primary osteoarthritis, unspecified knee: Secondary | ICD-10-CM | POA: Diagnosis not present

## 2011-12-21 DIAGNOSIS — C4492 Squamous cell carcinoma of skin, unspecified: Secondary | ICD-10-CM | POA: Insufficient documentation

## 2011-12-21 DIAGNOSIS — J302 Other seasonal allergic rhinitis: Secondary | ICD-10-CM

## 2011-12-21 DIAGNOSIS — R269 Unspecified abnormalities of gait and mobility: Secondary | ICD-10-CM | POA: Diagnosis not present

## 2011-12-21 NOTE — H&P (Signed)
TOTAL KNEE ADMISSION H&P  Patient is being admitted for right total knee arthroplasty.  Subjective:  Chief Complaint:right knee pain.  HPI: Kristin Kramer, 71 y.o. female, has a history of pain and functional disability in the right knee due to arthritis and has failed non-surgical conservative treatments for greater than 12 weeks to includeNSAID's and/or analgesics, corticosteriod injections, viscosupplementation injections, flexibility and strengthening excercises, use of assistive devices, weight reduction as appropriate and activity modification.  Onset of symptoms was gradual, starting 4 years ago with gradually worsening course since that time. The patient noted no past surgery on the right knee(s).  Patient currently rates pain in the right knee(s) at 7 out of 10 with activity. Patient has night pain, worsening of pain with activity and weight bearing, pain that interferes with activities of daily living, pain with passive range of motion, crepitus and joint swelling.  Patient has evidence of subchondral sclerosis, periarticular osteophytes and joint space narrowing by imaging studies. There is no active infection.  Patient Active Problem List   Diagnosis Date Noted  . Seasonal allergies   . Squamous cell skin cancer   . Right knee DJD   . Hyperlipidemia 12/12/2011  . Depression   . Migraines   . Abnormal EKG   . Arthritis    Past Medical History  Diagnosis Date  . Seasonal allergies     cats, dust, mold, roaches  . Depression     prior on lexapro, celexa, wellbutrin  . Migraines   . Squamous cell skin cancer   . Abnormal EKG   . Arthritis   . Right knee DJD     Past Surgical History  Procedure Date  . Tonsillectomy and adenoidectomy 1945  . Abdominal hysterectomy 1980  . Bunionectomy 2013    R and L foot  . Cataract extraction 2013    R, pending L  . Cosmetic surgery 2002    face lift     (Not in a hospital admission) No Known Allergies  History  Substance Use  Topics  . Smoking status: Never Smoker   . Smokeless tobacco: Never Used  . Alcohol Use: Yes     occasional    Family History  Problem Relation Age of Onset  . Cancer Mother     lung, smoker  . Cancer Father     lung, smoker  . Cancer Maternal Grandmother 48    leukemia  . Coronary artery disease Paternal Grandmother 7    sudden cardiac death  . Diabetes Neg Hx   . Stroke Neg Hx      Review of Systems  Constitutional: Negative.   HENT: Negative.   Eyes: Negative.   Respiratory: Negative.   Cardiovascular: Negative.   Gastrointestinal: Negative.   Genitourinary: Negative.   Musculoskeletal: Positive for joint pain.       Right knee  Skin: Negative.   Neurological: Negative.   Endo/Heme/Allergies: Negative.   Psychiatric/Behavioral: Negative.     Objective:  Physical Exam  Constitutional: She is oriented to person, place, and time. She appears well-developed and well-nourished.  HENT:  Head: Normocephalic and atraumatic.  Mouth/Throat: Oropharynx is clear and moist.  Eyes: EOM are normal. Pupils are equal, round, and reactive to light.  Neck: Neck supple.  Cardiovascular: Normal rate and regular rhythm.   Respiratory: Effort normal.  GI: Soft.  Genitourinary:       Not pertinent to current symptomatology therefore not examined.  Musculoskeletal:       Examination of her right  knee reveals 1+ effusion diffuse pain, 1+ crepitation, full range of motion knee is stable with normal patellar tracking. Exam of her left knee reveals full range of motion without pain swelling weakness or instability. Vascular exam: pulses 2+ and symmetric.   Neurological: She is alert and oriented to person, place, and time.  Skin: Skin is warm and dry.    Vital signs in last 24 hours: 5'5" 151 125/83 95% room air 98.1 Pulse 92  Labs:   Estimated Body mass index is 25.13 kg/(m^2) as calculated from the following:   Height as of this encounter: 5\' 5" (1.651 m).   Weight as of  this encounter: 151 lb(68.493 kg).   Imaging Review Plain radiographs demonstrate severe degenerative joint disease of the right knee(s). The overall alignment ismild varus. The bone quality appears to be good for age and reported activity level.  Assessment/Plan:  End stage arthritis, right knee   The patient history, physical examination, clinical judgment of the provider and imaging studies are consistent with end stage degenerative joint disease of the right knee(s) and total knee arthroplasty is deemed medically necessary. The treatment options including medical management, injection therapy arthroscopy and arthroplasty were discussed at length. The risks and benefits of total knee arthroplasty were presented and reviewed. The risks due to aseptic loosening, infection, stiffness, patella tracking problems, thromboembolic complications and other imponderables were discussed. The patient acknowledged the explanation, agreed to proceed with the plan and consent was signed. Patient is being admitted for inpatient treatment for surgery, pain control, PT, OT, prophylactic antibiotics, VTE prophylaxis, progressive ambulation and ADL's and discharge planning. The patient is planning to be discharged to skilled nursing facility  Kristin Kramer A. Gwinda Passe Physician Assistant [facility] [phone]  12/21/2011, 3:21 PM

## 2011-12-22 NOTE — Progress Notes (Signed)
Noted that patient did not have a CBC order as part of her pre-operative lab testing.  I spoke with Julien Girt, PA-C with Dr. Thurston Hole.  She is currently unable to enter the CBC with diff order.  CBC with diff ordered entered on behalf of Kirstin Shepperson PA-C/Dr. Thurston Hole as requested.  Shonna Chock, PA-C/Anesthesiology/Short Stay

## 2011-12-23 DIAGNOSIS — J309 Allergic rhinitis, unspecified: Secondary | ICD-10-CM | POA: Diagnosis not present

## 2011-12-23 NOTE — Pre-Procedure Instructions (Signed)
[address] Klyn Kroening  12/23/2011   Your procedure is scheduled on:  Mon, Oct 28 @ 11:15 AM  Report to [facility] at 9:15 AM.  Call this number if you have problems the morning of surgery: [phone]   Remember:   Do not eat food:After Midnight.    Take these medicines the morning of surgery with A SIP OF WATER: Cymbalta(Duloxetine),Lorazepam(Ativan),and Tramadol(Ultram-if needed)   Do not wear jewelry, make-up or nail polish.  Do not wear lotions, powders, or perfumes. You may wear deodorant.  Do not shave 48 hours prior to surgery.   Do not bring valuables to the hospital.  Contacts, dentures or bridgework may not be worn into surgery.  Leave suitcase in the car. After surgery it may be brought to your room.  For patients admitted to the hospital, checkout time is 11:00 AM the day of discharge.   Patients discharged the day of surgery will not be allowed to drive home.    Special Instructions: Shower using CHG 2 nights before surgery and the night before surgery.  If you shower the day of surgery use CHG.  Use special wash - you have one bottle of CHG for all showers.  You should use approximately 1/3 of the bottle for each shower.   Please read over the following fact sheets that you were given: Pain Booklet, Coughing and Deep Breathing, Blood Transfusion Information, MRSA Information and Surgical Site Infection Prevention

## 2011-12-26 ENCOUNTER — Encounter (HOSPITAL_COMMUNITY)
Admission: RE | Admit: 2011-12-26 | Discharge: 2011-12-26 | Payer: Medicare Other | Source: Ambulatory Visit | Attending: Orthopedic Surgery | Admitting: Orthopedic Surgery

## 2011-12-27 ENCOUNTER — Encounter (HOSPITAL_COMMUNITY): Payer: Self-pay

## 2011-12-27 ENCOUNTER — Encounter (HOSPITAL_COMMUNITY)
Admission: RE | Admit: 2011-12-27 | Discharge: 2011-12-27 | Disposition: A | Discharge status: Home / Self Care | Payer: Medicare Other | Source: Ambulatory Visit | Attending: Orthopedic Surgery | Admitting: Orthopedic Surgery

## 2011-12-27 ENCOUNTER — Encounter (HOSPITAL_COMMUNITY)
Admission: RE | Admit: 2011-12-27 | Discharge: 2011-12-27 | Disposition: A | Discharge status: Home / Self Care | Payer: Medicare Other | Source: Ambulatory Visit | Attending: Physician Assistant | Admitting: Physician Assistant

## 2011-12-27 DIAGNOSIS — Z01811 Encounter for preprocedural respiratory examination: Secondary | ICD-10-CM | POA: Diagnosis not present

## 2011-12-27 LAB — CBC
HCT: 42.1 % (ref 36.0–46.0)
Platelets: 231 10*3/uL (ref 150–400)
RBC: 4.75 MIL/uL (ref 3.87–5.11)
RDW: 12.4 % (ref 11.5–15.5)
WBC: 5.5 10*3/uL (ref 4.0–10.5)

## 2011-12-27 LAB — COMPREHENSIVE METABOLIC PANEL
AST: 24 U/L (ref 0–37)
Albumin: 4.2 g/dL (ref 3.5–5.2)
BUN: 15 mg/dL (ref 6–23)
Calcium: 9.8 mg/dL (ref 8.4–10.5)
Creatinine, Ser: 0.78 mg/dL (ref 0.50–1.10)
Total Bilirubin: 0.3 mg/dL (ref 0.3–1.2)
Total Protein: 7.6 g/dL (ref 6.0–8.3)

## 2011-12-27 LAB — URINE MICROSCOPIC-ADD ON

## 2011-12-27 LAB — URINALYSIS, ROUTINE W REFLEX MICROSCOPIC
Bilirubin Urine: NEGATIVE
Nitrite: NEGATIVE
Specific Gravity, Urine: 1.015 (ref 1.005–1.030)
Urobilinogen, UA: 0.2 mg/dL (ref 0.0–1.0)
pH: 8 (ref 5.0–8.0)

## 2011-12-27 LAB — PROTIME-INR
INR: 0.97 (ref 0.00–1.49)
Prothrombin Time: 12.8 seconds (ref 11.6–15.2)

## 2011-12-27 LAB — APTT: aPTT: 31 seconds (ref 24–37)

## 2011-12-27 LAB — DIFFERENTIAL
Basophils Absolute: 0 10*3/uL (ref 0.0–0.1)
Lymphocytes Relative: 32 % (ref 12–46)
Lymphs Abs: 1.8 10*3/uL (ref 0.7–4.0)
Monocytes Absolute: 0.5 10*3/uL (ref 0.1–1.0)
Neutro Abs: 2.8 10*3/uL (ref 1.7–7.7)

## 2011-12-27 LAB — SURGICAL PCR SCREEN: Staphylococcus aureus: NEGATIVE

## 2011-12-27 MED ORDER — CHLORHEXIDINE GLUCONATE 4 % EX LIQD: 60.0000 mL | Freq: Once | CUTANEOUS | Status: DC

## 2011-12-28 NOTE — Consult Note (Signed)
And Sanguinetti is a 71 year old female scheduled for right total knee replacement on 01/02/2012 by Dr. Salvatore Marvel.  Preop ECG showed an incomplete right bundle branch block and left anterior fascicular block. She was seen by Dr. Aquilla Hacker and found to be an acceptable risk for surgery. Good exercise tolerance, no cardiac history. And no significant cardiac risk factors.  Plan to proceed with surgery as scheduled.  Kipp Brood, M.D.

## 2011-12-29 LAB — URINE CULTURE

## 2011-12-30 DIAGNOSIS — J309 Allergic rhinitis, unspecified: Secondary | ICD-10-CM | POA: Diagnosis not present

## 2012-01-01 MED ORDER — CEFAZOLIN SODIUM-DEXTROSE 2-3 GM-% IV SOLR
2.0000 g | INTRAVENOUS | Status: AC
  Administered 2012-01-02: 2 g via INTRAVENOUS
  Filled 2012-01-01: qty 50

## 2012-01-01 MED ORDER — POVIDONE-IODINE 7.5 % EX SOLN
Freq: Once | CUTANEOUS | Status: DC
  Filled 2012-01-01: qty 118

## 2012-01-02 ENCOUNTER — Encounter (HOSPITAL_COMMUNITY): Payer: Self-pay | Admitting: Vascular Surgery

## 2012-01-02 ENCOUNTER — Inpatient Hospital Stay (HOSPITAL_COMMUNITY): Payer: Medicare Other | Admitting: Vascular Surgery

## 2012-01-02 ENCOUNTER — Encounter (HOSPITAL_COMMUNITY)
Admission: RE | Disposition: A | Discharge status: Skilled Nursing Facility | Payer: Self-pay | Source: Ambulatory Visit | Attending: Orthopedic Surgery

## 2012-01-02 ENCOUNTER — Inpatient Hospital Stay (HOSPITAL_COMMUNITY)
Admission: RE | Admit: 2012-01-02 | Discharge: 2012-01-05 | DRG: 470 | Disposition: A | Discharge status: Skilled Nursing Facility | Payer: Medicare Other | Source: Ambulatory Visit | Attending: Orthopedic Surgery | Admitting: Orthopedic Surgery

## 2012-01-02 ENCOUNTER — Encounter (HOSPITAL_COMMUNITY): Payer: Self-pay | Admitting: *Deleted

## 2012-01-02 DIAGNOSIS — K5900 Constipation, unspecified: Secondary | ICD-10-CM | POA: Diagnosis not present

## 2012-01-02 DIAGNOSIS — F3289 Other specified depressive episodes: Secondary | ICD-10-CM | POA: Diagnosis present

## 2012-01-02 DIAGNOSIS — J9819 Other pulmonary collapse: Secondary | ICD-10-CM | POA: Diagnosis not present

## 2012-01-02 DIAGNOSIS — A498 Other bacterial infections of unspecified site: Secondary | ICD-10-CM | POA: Diagnosis present

## 2012-01-02 DIAGNOSIS — Z79899 Other long term (current) drug therapy: Secondary | ICD-10-CM

## 2012-01-02 DIAGNOSIS — J302 Other seasonal allergic rhinitis: Secondary | ICD-10-CM | POA: Diagnosis present

## 2012-01-02 DIAGNOSIS — M1710 Unilateral primary osteoarthritis, unspecified knee: Secondary | ICD-10-CM | POA: Diagnosis not present

## 2012-01-02 DIAGNOSIS — K219 Gastro-esophageal reflux disease without esophagitis: Secondary | ICD-10-CM | POA: Diagnosis present

## 2012-01-02 DIAGNOSIS — M1711 Unilateral primary osteoarthritis, right knee: Secondary | ICD-10-CM

## 2012-01-02 DIAGNOSIS — R0602 Shortness of breath: Secondary | ICD-10-CM | POA: Diagnosis not present

## 2012-01-02 DIAGNOSIS — A499 Bacterial infection, unspecified: Secondary | ICD-10-CM

## 2012-01-02 DIAGNOSIS — E785 Hyperlipidemia, unspecified: Secondary | ICD-10-CM | POA: Diagnosis present

## 2012-01-02 DIAGNOSIS — M25569 Pain in unspecified knee: Secondary | ICD-10-CM | POA: Diagnosis not present

## 2012-01-02 DIAGNOSIS — F411 Generalized anxiety disorder: Secondary | ICD-10-CM | POA: Diagnosis present

## 2012-01-02 DIAGNOSIS — IMO0002 Reserved for concepts with insufficient information to code with codable children: Secondary | ICD-10-CM | POA: Diagnosis not present

## 2012-01-02 DIAGNOSIS — G43909 Migraine, unspecified, not intractable, without status migrainosus: Secondary | ICD-10-CM | POA: Diagnosis present

## 2012-01-02 DIAGNOSIS — F329 Major depressive disorder, single episode, unspecified: Secondary | ICD-10-CM | POA: Diagnosis present

## 2012-01-02 DIAGNOSIS — Z85828 Personal history of other malignant neoplasm of skin: Secondary | ICD-10-CM | POA: Diagnosis not present

## 2012-01-02 DIAGNOSIS — R0789 Other chest pain: Secondary | ICD-10-CM | POA: Diagnosis present

## 2012-01-02 DIAGNOSIS — G8918 Other acute postprocedural pain: Secondary | ICD-10-CM | POA: Diagnosis not present

## 2012-01-02 DIAGNOSIS — F331 Major depressive disorder, recurrent, moderate: Secondary | ICD-10-CM | POA: Diagnosis present

## 2012-01-02 DIAGNOSIS — R079 Chest pain, unspecified: Secondary | ICD-10-CM | POA: Diagnosis not present

## 2012-01-02 DIAGNOSIS — N390 Urinary tract infection, site not specified: Secondary | ICD-10-CM | POA: Diagnosis not present

## 2012-01-02 DIAGNOSIS — J90 Pleural effusion, not elsewhere classified: Secondary | ICD-10-CM | POA: Diagnosis not present

## 2012-01-02 DIAGNOSIS — R071 Chest pain on breathing: Secondary | ICD-10-CM | POA: Diagnosis not present

## 2012-01-02 DIAGNOSIS — Z5189 Encounter for other specified aftercare: Secondary | ICD-10-CM | POA: Diagnosis not present

## 2012-01-02 DIAGNOSIS — D62 Acute posthemorrhagic anemia: Secondary | ICD-10-CM | POA: Diagnosis not present

## 2012-01-02 DIAGNOSIS — Z01812 Encounter for preprocedural laboratory examination: Secondary | ICD-10-CM | POA: Diagnosis not present

## 2012-01-02 DIAGNOSIS — Z96659 Presence of unspecified artificial knee joint: Secondary | ICD-10-CM | POA: Diagnosis not present

## 2012-01-02 DIAGNOSIS — F419 Anxiety disorder, unspecified: Secondary | ICD-10-CM | POA: Diagnosis present

## 2012-01-02 DIAGNOSIS — S8990XA Unspecified injury of unspecified lower leg, initial encounter: Secondary | ICD-10-CM | POA: Diagnosis not present

## 2012-01-02 DIAGNOSIS — R072 Precordial pain: Secondary | ICD-10-CM | POA: Diagnosis not present

## 2012-01-02 HISTORY — DX: Reserved for inherently not codable concepts without codable children: IMO0001

## 2012-01-02 HISTORY — PX: TOTAL KNEE ARTHROPLASTY: SHX125

## 2012-01-02 HISTORY — DX: Procedure and treatment not carried out because of patient's decision for reasons of belief and group pressure: Z53.1

## 2012-01-02 SURGERY — ARTHROPLASTY, KNEE, TOTAL
Anesthesia: General | Site: Knee | Laterality: Right | Wound class: Clean

## 2012-01-02 MED ORDER — BISACODYL 5 MG PO TBEC
10.0000 mg | DELAYED_RELEASE_TABLET | Freq: Every day | ORAL | Status: DC
  Administered 2012-01-02 – 2012-01-03 (×2): 10 mg via ORAL
  Filled 2012-01-02 (×2): qty 2

## 2012-01-02 MED ORDER — POTASSIUM CHLORIDE IN NACL 20-0.9 MEQ/L-% IV SOLN
INTRAVENOUS | Status: DC
  Administered 2012-01-03 (×2): via INTRAVENOUS
  Filled 2012-01-02 (×8): qty 1000

## 2012-01-02 MED ORDER — ACETAMINOPHEN 650 MG RE SUPP: 650.0000 mg | Freq: Four times a day (QID) | RECTAL | Status: DC | PRN

## 2012-01-02 MED ORDER — BUPIVACAINE-EPINEPHRINE PF 0.25-1:200000 % IJ SOLN
INTRAMUSCULAR | Status: AC
  Filled 2012-01-02: qty 30

## 2012-01-02 MED ORDER — HYDROMORPHONE HCL PF 1 MG/ML IJ SOLN
0.2500 mg | INTRAMUSCULAR | Status: DC | PRN
  Administered 2012-01-02 (×4): 0.5 mg via INTRAVENOUS

## 2012-01-02 MED ORDER — LACTATED RINGERS IV SOLN: INTRAVENOUS | Status: DC

## 2012-01-02 MED ORDER — DEXAMETHASONE 4 MG PO TABS
10.0000 mg | ORAL_TABLET | Freq: Every day | ORAL | Status: AC
  Administered 2012-01-02 – 2012-01-04 (×3): 10 mg via ORAL
  Filled 2012-01-02 (×3): qty 1

## 2012-01-02 MED ORDER — ACETAMINOPHEN 10 MG/ML IV SOLN
INTRAVENOUS | Status: AC
  Filled 2012-01-02: qty 100

## 2012-01-02 MED ORDER — MIDAZOLAM HCL 2 MG/2ML IJ SOLN: 0.5000 mg | Freq: Once | INTRAMUSCULAR | Status: DC | PRN

## 2012-01-02 MED ORDER — PROMETHAZINE HCL 25 MG/ML IJ SOLN: 6.2500 mg | INTRAMUSCULAR | Status: DC | PRN

## 2012-01-02 MED ORDER — METOCLOPRAMIDE HCL 10 MG PO TABS: 5.0000 mg | ORAL_TABLET | Freq: Three times a day (TID) | ORAL | Status: DC | PRN

## 2012-01-02 MED ORDER — PHENOL 1.4 % MT LIQD: 1.0000 | OROMUCOSAL | Status: DC | PRN

## 2012-01-02 MED ORDER — MEPERIDINE HCL 25 MG/ML IJ SOLN: 6.2500 mg | INTRAMUSCULAR | Status: DC | PRN

## 2012-01-02 MED ORDER — LORAZEPAM 0.5 MG PO TABS
0.2500 mg | ORAL_TABLET | Freq: Four times a day (QID) | ORAL | Status: DC | PRN
  Administered 2012-01-03: 0.25 mg via ORAL
  Filled 2012-01-02: qty 1

## 2012-01-02 MED ORDER — CEFAZOLIN SODIUM-DEXTROSE 2-3 GM-% IV SOLR
2.0000 g | Freq: Four times a day (QID) | INTRAVENOUS | Status: AC
  Administered 2012-01-02 – 2012-01-03 (×2): 2 g via INTRAVENOUS
  Filled 2012-01-02 (×2): qty 50

## 2012-01-02 MED ORDER — SCOPOLAMINE 1 MG/3DAYS TD PT72
MEDICATED_PATCH | TRANSDERMAL | Status: AC
  Filled 2012-01-02: qty 1

## 2012-01-02 MED ORDER — HYDROMORPHONE HCL PF 1 MG/ML IJ SOLN
INTRAMUSCULAR | Status: AC
  Filled 2012-01-02: qty 1

## 2012-01-02 MED ORDER — LACTATED RINGERS IV SOLN
INTRAVENOUS | Status: DC | PRN
  Administered 2012-01-02 (×2): via INTRAVENOUS

## 2012-01-02 MED ORDER — DEXAMETHASONE SODIUM PHOSPHATE 4 MG/ML IJ SOLN
INTRAMUSCULAR | Status: DC | PRN
  Administered 2012-01-02: 8 mg via INTRAVENOUS

## 2012-01-02 MED ORDER — CALCIUM CARBONATE 1250 (500 CA) MG PO TABS
1250.0000 mg | ORAL_TABLET | Freq: Two times a day (BID) | ORAL | Status: DC
  Administered 2012-01-03 – 2012-01-05 (×5): 1250 mg via ORAL
  Filled 2012-01-02 (×8): qty 1

## 2012-01-02 MED ORDER — BUPIVACAINE-EPINEPHRINE PF 0.5-1:200000 % IJ SOLN
INTRAMUSCULAR | Status: DC | PRN
  Administered 2012-01-02: 30 mL

## 2012-01-02 MED ORDER — MENTHOL 3 MG MT LOZG: 1.0000 | LOZENGE | OROMUCOSAL | Status: DC | PRN

## 2012-01-02 MED ORDER — SODIUM CHLORIDE 0.9 % IR SOLN
Status: DC | PRN
  Administered 2012-01-02: 1000 mL
  Administered 2012-01-02: 3000 mL

## 2012-01-02 MED ORDER — ACETAMINOPHEN 325 MG PO TABS: 650.0000 mg | ORAL_TABLET | Freq: Four times a day (QID) | ORAL | Status: DC | PRN

## 2012-01-02 MED ORDER — MIDAZOLAM HCL 2 MG/2ML IJ SOLN
INTRAMUSCULAR | Status: AC
  Filled 2012-01-02: qty 2

## 2012-01-02 MED ORDER — VITAMIN D 1000 UNITS PO TABS
5000.0000 [IU] | ORAL_TABLET | Freq: Every day | ORAL | Status: DC
  Administered 2012-01-03 – 2012-01-05 (×3): 5000 [IU] via ORAL
  Filled 2012-01-02 (×4): qty 5

## 2012-01-02 MED ORDER — FENTANYL CITRATE 0.05 MG/ML IJ SOLN
INTRAMUSCULAR | Status: AC
  Filled 2012-01-02: qty 2

## 2012-01-02 MED ORDER — CELECOXIB 200 MG PO CAPS
200.0000 mg | ORAL_CAPSULE | Freq: Two times a day (BID) | ORAL | Status: DC
  Administered 2012-01-02 – 2012-01-05 (×6): 200 mg via ORAL
  Filled 2012-01-02 (×7): qty 1

## 2012-01-02 MED ORDER — LIDOCAINE HCL (CARDIAC) 20 MG/ML IV SOLN
INTRAVENOUS | Status: DC | PRN
  Administered 2012-01-02: 100 mg via INTRAVENOUS

## 2012-01-02 MED ORDER — METOCLOPRAMIDE HCL 5 MG/ML IJ SOLN: 5.0000 mg | Freq: Three times a day (TID) | INTRAMUSCULAR | Status: DC | PRN

## 2012-01-02 MED ORDER — CEFUROXIME SODIUM 1.5 G IJ SOLR
INTRAMUSCULAR | Status: DC | PRN
  Administered 2012-01-02: 1.5 g

## 2012-01-02 MED ORDER — MIDAZOLAM HCL 5 MG/5ML IJ SOLN
INTRAMUSCULAR | Status: DC | PRN
  Administered 2012-01-02: 2 mg via INTRAVENOUS

## 2012-01-02 MED ORDER — PROPOFOL 10 MG/ML IV BOLUS
INTRAVENOUS | Status: DC | PRN
  Administered 2012-01-02: 120 mg via INTRAVENOUS

## 2012-01-02 MED ORDER — DIPHENHYDRAMINE HCL 12.5 MG/5ML PO ELIX: 12.5000 mg | ORAL_SOLUTION | ORAL | Status: DC | PRN

## 2012-01-02 MED ORDER — LACTATED RINGERS IV SOLN
INTRAVENOUS | Status: DC
  Administered 2012-01-02: 12:00:00 via INTRAVENOUS

## 2012-01-02 MED ORDER — DOCUSATE SODIUM 100 MG PO CAPS
100.0000 mg | ORAL_CAPSULE | Freq: Two times a day (BID) | ORAL | Status: DC
  Administered 2012-01-02 – 2012-01-05 (×6): 100 mg via ORAL
  Filled 2012-01-02 (×7): qty 1

## 2012-01-02 MED ORDER — OXYCODONE HCL 5 MG PO TABS
5.0000 mg | ORAL_TABLET | ORAL | Status: DC | PRN
  Administered 2012-01-03 – 2012-01-05 (×10): 10 mg via ORAL
  Filled 2012-01-02 (×10): qty 2

## 2012-01-02 MED ORDER — ONDANSETRON HCL 4 MG/2ML IJ SOLN: 4.0000 mg | Freq: Four times a day (QID) | INTRAMUSCULAR | Status: DC | PRN

## 2012-01-02 MED ORDER — ACETAMINOPHEN 10 MG/ML IV SOLN
1000.0000 mg | Freq: Four times a day (QID) | INTRAVENOUS | Status: AC
  Administered 2012-01-02 – 2012-01-03 (×4): 1000 mg via INTRAVENOUS
  Filled 2012-01-02 (×4): qty 100

## 2012-01-02 MED ORDER — MIDAZOLAM HCL 2 MG/2ML IJ SOLN
1.0000 mg | INTRAMUSCULAR | Status: DC | PRN
  Administered 2012-01-02: 1 mg via INTRAVENOUS

## 2012-01-02 MED ORDER — DEXAMETHASONE SODIUM PHOSPHATE 10 MG/ML IJ SOLN
10.0000 mg | Freq: Every day | INTRAMUSCULAR | Status: AC
  Filled 2012-01-02 (×3): qty 1

## 2012-01-02 MED ORDER — ONDANSETRON HCL 4 MG PO TABS: 4.0000 mg | ORAL_TABLET | Freq: Four times a day (QID) | ORAL | Status: DC | PRN

## 2012-01-02 MED ORDER — ENOXAPARIN SODIUM 30 MG/0.3ML ~~LOC~~ SOLN
30.0000 mg | Freq: Two times a day (BID) | SUBCUTANEOUS | Status: DC
  Administered 2012-01-03 – 2012-01-05 (×5): 30 mg via SUBCUTANEOUS
  Filled 2012-01-02 (×7): qty 0.3

## 2012-01-02 MED ORDER — FENTANYL CITRATE 0.05 MG/ML IJ SOLN
50.0000 ug | Freq: Once | INTRAMUSCULAR | Status: AC
  Administered 2012-01-02: 50 ug via INTRAVENOUS

## 2012-01-02 MED ORDER — CEFUROXIME SODIUM 1.5 G IJ SOLR
INTRAMUSCULAR | Status: AC
  Filled 2012-01-02: qty 1.5

## 2012-01-02 MED ORDER — DULOXETINE HCL 20 MG PO CPEP
20.0000 mg | ORAL_CAPSULE | Freq: Every day | ORAL | Status: DC
Start: 2012-01-02 — End: 2012-01-05
  Administered 2012-01-03 – 2012-01-05 (×3): 20 mg via ORAL
  Filled 2012-01-02 (×4): qty 1

## 2012-01-02 MED ORDER — FENTANYL CITRATE 0.05 MG/ML IJ SOLN
INTRAMUSCULAR | Status: DC | PRN
  Administered 2012-01-02: 50 ug via INTRAVENOUS
  Administered 2012-01-02: 25 ug via INTRAVENOUS
  Administered 2012-01-02: 50 ug via INTRAVENOUS
  Administered 2012-01-02: 25 ug via INTRAVENOUS
  Administered 2012-01-02 (×2): 50 ug via INTRAVENOUS

## 2012-01-02 MED ORDER — HYDROMORPHONE HCL PF 1 MG/ML IJ SOLN: 0.5000 mg | INTRAMUSCULAR | Status: DC | PRN

## 2012-01-02 MED ORDER — OXYCODONE HCL 5 MG PO TABS: 5.0000 mg | ORAL_TABLET | Freq: Once | ORAL | Status: DC | PRN

## 2012-01-02 MED ORDER — OXYCODONE HCL 5 MG/5ML PO SOLN: 5.0000 mg | Freq: Once | ORAL | Status: DC | PRN

## 2012-01-02 MED ORDER — SCOPOLAMINE 1 MG/3DAYS TD PT72
MEDICATED_PATCH | TRANSDERMAL | Status: DC | PRN
  Administered 2012-01-02: 1 mg via TRANSDERMAL

## 2012-01-02 MED ORDER — BUPIVACAINE-EPINEPHRINE 0.25% -1:200000 IJ SOLN
INTRAMUSCULAR | Status: DC | PRN
  Administered 2012-01-02: 30 mL

## 2012-01-02 SURGICAL SUPPLY — 74 items
BANDAGE ESMARK 6X9 LF (GAUZE/BANDAGES/DRESSINGS) ×1 IMPLANT
BLADE SAGITTAL 25.0X1.19X90 (BLADE) ×2 IMPLANT
BLADE SAW SGTL 11.0X1.19X90.0M (BLADE) IMPLANT
BLADE SAW SGTL 13.0X1.19X90.0M (BLADE) ×2 IMPLANT
BLADE SURG 10 STRL SS (BLADE) ×4 IMPLANT
BNDG ELASTIC 6X10 VLCR STRL LF (GAUZE/BANDAGES/DRESSINGS) ×2 IMPLANT
BNDG ELASTIC 6X15 VLCR STRL LF (GAUZE/BANDAGES/DRESSINGS) ×2 IMPLANT
BNDG ESMARK 6X9 LF (GAUZE/BANDAGES/DRESSINGS) ×2
BOWL SMART MIX CTS (DISPOSABLE) ×2 IMPLANT
CEMENT HV SMART SET (Cement) ×4 IMPLANT
CEMENT TIBIA MBT SIZE 2.5 (Knees) ×1 IMPLANT
CLOTH BEACON ORANGE TIMEOUT ST (SAFETY) ×2 IMPLANT
CLSR STERI-STRIP ANTIMIC 1/2X4 (GAUZE/BANDAGES/DRESSINGS) ×2 IMPLANT
COVER BACK TABLE 24X17X13 BIG (DRAPES) IMPLANT
COVER PROBE W GEL 5X96 (DRAPES) IMPLANT
COVER SURGICAL LIGHT HANDLE (MISCELLANEOUS) ×2 IMPLANT
CUFF TOURNIQUET SINGLE 34IN LL (TOURNIQUET CUFF) ×2 IMPLANT
CUFF TOURNIQUET SINGLE 44IN (TOURNIQUET CUFF) IMPLANT
DRAPE EXTREMITY T 121X128X90 (DRAPE) ×2 IMPLANT
DRAPE INCISE IOBAN 66X45 STRL (DRAPES) ×2 IMPLANT
DRAPE PROXIMA HALF (DRAPES) ×2 IMPLANT
DRAPE U-SHAPE 47X51 STRL (DRAPES) ×2 IMPLANT
DRSG ADAPTIC 3X8 NADH LF (GAUZE/BANDAGES/DRESSINGS) ×2 IMPLANT
DRSG PAD ABDOMINAL 8X10 ST (GAUZE/BANDAGES/DRESSINGS) ×4 IMPLANT
DURAPREP 26ML APPLICATOR (WOUND CARE) ×2 IMPLANT
ELECT CAUTERY BLADE 6.4 (BLADE) ×2 IMPLANT
ELECT REM PT RETURN 9FT ADLT (ELECTROSURGICAL) ×2
ELECTRODE REM PT RTRN 9FT ADLT (ELECTROSURGICAL) ×1 IMPLANT
EVACUATOR 1/8 PVC DRAIN (DRAIN) ×2 IMPLANT
FACESHIELD LNG OPTICON STERILE (SAFETY) ×2 IMPLANT
FEMUR RIGHT SZ 3 (Knees) ×2 IMPLANT
GLOVE BIO SURGEON STRL SZ7 (GLOVE) ×2 IMPLANT
GLOVE BIOGEL PI IND STRL 7.0 (GLOVE) ×1 IMPLANT
GLOVE BIOGEL PI IND STRL 7.5 (GLOVE) ×1 IMPLANT
GLOVE BIOGEL PI INDICATOR 7.0 (GLOVE) ×1
GLOVE BIOGEL PI INDICATOR 7.5 (GLOVE) ×1
GLOVE SS BIOGEL STRL SZ 7.5 (GLOVE) ×1 IMPLANT
GLOVE SUPERSENSE BIOGEL SZ 7.5 (GLOVE) ×1
GOWN PREVENTION PLUS XLARGE (GOWN DISPOSABLE) ×4 IMPLANT
GOWN STRL NON-REIN LRG LVL3 (GOWN DISPOSABLE) ×4 IMPLANT
GOWN STRL REIN XL XLG (GOWN DISPOSABLE) ×2 IMPLANT
HANDPIECE INTERPULSE COAX TIP (DISPOSABLE) ×1
HOOD PEEL AWAY FACE SHEILD DIS (HOOD) ×6 IMPLANT
IMMOBILIZER KNEE 22 UNIV (SOFTGOODS) ×2 IMPLANT
INSERT CUSHION PRONEVIEW LG (MISCELLANEOUS) IMPLANT
INSERT TIBIAL PFC SIG SZ3 10MM (Knees) ×2 IMPLANT
KIT BASIN OR (CUSTOM PROCEDURE TRAY) ×2 IMPLANT
KIT ROOM TURNOVER OR (KITS) ×2 IMPLANT
MANIFOLD NEPTUNE II (INSTRUMENTS) ×2 IMPLANT
NS IRRIG 1000ML POUR BTL (IV SOLUTION) ×2 IMPLANT
PACK TOTAL JOINT (CUSTOM PROCEDURE TRAY) ×2 IMPLANT
PAD ARMBOARD 7.5X6 YLW CONV (MISCELLANEOUS) ×2 IMPLANT
PAD CAST 4YDX4 CTTN HI CHSV (CAST SUPPLIES) ×1 IMPLANT
PADDING CAST COTTON 4X4 STRL (CAST SUPPLIES) ×1
PADDING CAST COTTON 6X4 STRL (CAST SUPPLIES) ×2 IMPLANT
PATELLA DOME PFC 32MM (Knees) ×2 IMPLANT
POSITIONER HEAD PRONE TRACH (MISCELLANEOUS) ×2 IMPLANT
RUBBERBAND STERILE (MISCELLANEOUS) ×2 IMPLANT
SET HNDPC FAN SPRY TIP SCT (DISPOSABLE) ×1 IMPLANT
SPONGE GAUZE 4X4 12PLY (GAUZE/BANDAGES/DRESSINGS) ×2 IMPLANT
STRIP CLOSURE SKIN 1/2X4 (GAUZE/BANDAGES/DRESSINGS) IMPLANT
SUCTION FRAZIER TIP 10 FR DISP (SUCTIONS) ×2 IMPLANT
SUT ETHIBOND NAB CT1 #1 30IN (SUTURE) ×4 IMPLANT
SUT MNCRL AB 3-0 PS2 18 (SUTURE) ×2 IMPLANT
SUT VIC AB 0 CT1 27 (SUTURE) ×2
SUT VIC AB 0 CT1 27XBRD ANBCTR (SUTURE) ×2 IMPLANT
SUT VIC AB 2-0 CT1 27 (SUTURE) ×1
SUT VIC AB 2-0 CT1 TAPERPNT 27 (SUTURE) ×1 IMPLANT
SYR 30ML SLIP (SYRINGE) ×2 IMPLANT
TIBIA MBT CEMENT SIZE 2.5 (Knees) ×2 IMPLANT
TOWEL OR 17X24 6PK STRL BLUE (TOWEL DISPOSABLE) ×2 IMPLANT
TOWEL OR 17X26 10 PK STRL BLUE (TOWEL DISPOSABLE) ×2 IMPLANT
TRAY FOLEY CATH 14FR (SET/KITS/TRAYS/PACK) ×2 IMPLANT
WATER STERILE IRR 1000ML POUR (IV SOLUTION) ×4 IMPLANT

## 2012-01-02 NOTE — Transfer of Care (Signed)
Immediate Anesthesia Transfer of Care Note  Patient: Kristin Kramer  Procedure(s) Performed: Procedure(s) (LRB) with comments: TOTAL KNEE ARTHROPLASTY (Right) - right total knee replacement  Patient Location: PACU  Anesthesia Type:   Level of Consciousness: awake, alert  and oriented  Airway & Oxygen Therapy: Patient Spontanous Breathing  Post-op Assessment: Report given to PACU RN  Post vital signs: stable  Complications: No apparent anesthesia complications

## 2012-01-02 NOTE — Op Note (Signed)
MRN:     [id number] DOB/AGE:    11-Apr-1940 / 71 y.o.       OPERATIVE REPORT    DATE OF PROCEDURE:  01/02/2012       PREOPERATIVE DIAGNOSIS:   RIGHT KNEE DJD      There is no height or weight on file to calculate BMI.                                                        POSTOPERATIVE DIAGNOSIS:   RIGHT KNEE DJD                                                                      PROCEDURE:  Procedure(s): TOTAL KNEE ARTHROPLASTY Using Depuy Sigma RP implants #3 Femur, #2.5Tibia, 10mm sigma RP bearing, 32 Patella     SURGEON: Tyja Gortney A    ASSISTANT:  Kirstin Shepperson PA-C   (Present and scrubbed throughout the case, critical for assistance with exposure, retraction, instrumentation, and closure.)         ANESTHESIA: GET with Femoral Nerve Block  DRAINS: foley, 2 medium hemovac in knee   TOURNIQUET TIME:   COMPLICATIONS:  None     SPECIMENS: None   INDICATIONS FOR PROCEDURE: The patient has  RIGHT KNEE DJD, varus deformities, XR shows bone on bone arthritis. Patient has failed all conservative measures including anti-inflammatory medicines, narcotics, attempts at  exercise and weight loss, cortisone injections and viscosupplementation.  Risks and benefits of surgery have been discussed, questions answered.   DESCRIPTION OF PROCEDURE: The patient identified by armband, received  right femoral nerve block and IV antibiotics, in the holding area at [facility]. Patient taken to the operating room, appropriate anesthetic  monitors were attached General endotracheal anesthesia induced with  the patient in supine position, Foley catheter was inserted. Tourniquet  applied high to the operative thigh. Lateral post and foot positioner  applied to the table, the lower extremity was then prepped and draped  in usual sterile fashion from the ankle to the tourniquet. Time-out procedure was performed. The limb was wrapped with an Esmarch bandage and the tourniquet inflated to  365 mmHg. We began the operation by making the anterior midline incision starting at handbreadth above the patella going over the patella 1 cm medial to and  4 cm distal to the tibial tubercle. Small bleeders in the skin and the  subcutaneous tissue identified and cauterized. Transverse retinaculum was incised and reflected medially and a medial parapatellar arthrotomy was accomplished. the patella was everted and theprepatellar fat pad resected. The superficial medial collateral  ligament was then elevated from anterior to posterior along the proximal  flare of the tibia and anterior half of the menisci resected. The knee was hyperflexed exposing bone on bone arthritis. Peripheral and notch osteophytes as well as the cruciate ligaments were then resected. We continued to  work our way around posteriorly along the proximal tibia, and externally  rotated the tibia subluxing it out from underneath the femur. A McHale  retractor was placed through the notch and a lateral  Hohmann retractor  placed, and we then drilled through the proximal tibia in line with the  axis of the tibia followed by an intramedullary guide rod and 2-degree  posterior slope cutting guide. The tibial cutting guide was pinned into place  allowing resection of 4 mm of bone medially and about 6 mm of bone  laterally because of her varus deformity. Satisfied with the tibial resection, we then  entered the distal femur 2 mm anterior to the PCL origin with the  intramedullary guide rod and applied the distal femoral cutting guide  set at 11mm, with 5 degrees of valgus. This was pinned along the  epicondylar axis. At this point, the distal femoral cut was accomplished without difficulty. We then sized for a #3 femoral component and pinned the guide in 3 degrees of external rotation.The chamfer cutting guide was pinned into place. The anterior, posterior, and chamfer cuts were accomplished without difficulty followed by  the Sigma RP box  cutting guide and the box cut. We also removed posterior osteophytes from the posterior femoral condyles. At this  time, the knee was brought into full extension. We checked our  extension and flexion gaps and found them symmetric at 10mm.  The patella thickness measured at 22 mm. We set the cutting guide at 13 and removed the posterior 8-9 mm  of the patella sized for 32 button and drilled the lollipop. The knee  was then once again hyperflexed exposing the proximal tibia. We sized for a #2.5 tibial base plate, applied the smokestack and the conical reamer followed by the the Delta fin keel punch. We then hammered into place the Sigma RP trial femoral component, inserted a 10-mm trial bearing, trial patellar button, and took the knee through range of motion from 0-130 degrees. No thumb pressure was required for patellar  tracking. At this point, all trial components were removed, a double batch of DePuy HV cement with 1500 mg of Zinacef was mixed and applied to all bony metallic mating surfaces except for the posterior condyles of the femur itself. In order, we  hammered into place the tibial tray and removed excess cement, the femoral component and removed excess cement, a 10-mm Sigma RP bearing  was inserted, and the knee brought to full extension with compression.  The patellar button was clamped into place, and excess cement  removed. While the cement cured the wound was irrigated out with normal saline solution pulse lavage, and medium Hemovac drains were placed.. Ligament stability and patellar tracking were checked and found to be excellent. The tourniquet was then released and hemostasis was obtained with cautery. The parapatellar arthrotomy was closed with  #1 vicryl suture. The subcutaneous tissue with 0 and 2-0 undyed  Vicryl suture, and 4-0 Monocryl.. A dressing of Xeroform,  4 x 4, dressing sponges, Webril, and Ace wrap applied. Needle and sponge count were correct times 2.The patient  awakened, extubated, and taken to recovery room without difficulty. Vascular status was normal, pulses 2+ and symmetric.   Joylyn Duggin A 01/02/2012, 2:20 PM

## 2012-01-02 NOTE — Progress Notes (Signed)
Orthopedic Tech Progress Note Patient Details:  Kristin Kramer [birth year] [id number]  CPM Right Knee CPM Right Knee: On Right Knee Flexion (Degrees): 60  Right Knee Extension (Degrees): 0  Additional Comments: trapeze bar   Cammer, Mickie Bail 01/02/2012, 4:02 PM

## 2012-01-02 NOTE — Progress Notes (Signed)
Patient states she has been taking cipro for a bladder infection.

## 2012-01-02 NOTE — H&P (View-Only) (Signed)
TOTAL KNEE ADMISSION H&P  Patient is being admitted for right total knee arthroplasty.  Subjective:  Chief Complaint:right knee pain.  HPI: Kristin Kramer, 71 y.o. female, has a history of pain and functional disability in the right knee due to arthritis and has failed non-surgical conservative treatments for greater than 12 weeks to includeNSAID's and/or analgesics, corticosteriod injections, viscosupplementation injections, flexibility and strengthening excercises, use of assistive devices, weight reduction as appropriate and activity modification.  Onset of symptoms was gradual, starting 4 years ago with gradually worsening course since that time. The patient noted no past surgery on the right knee(s).  Patient currently rates pain in the right knee(s) at 7 out of 10 with activity. Patient has night pain, worsening of pain with activity and weight bearing, pain that interferes with activities of daily living, pain with passive range of motion, crepitus and joint swelling.  Patient has evidence of subchondral sclerosis, periarticular osteophytes and joint space narrowing by imaging studies. There is no active infection.  Patient Active Problem List   Diagnosis Date Noted  . Seasonal allergies   . Squamous cell skin cancer   . Right knee DJD   . Hyperlipidemia 12/12/2011  . Depression   . Migraines   . Abnormal EKG   . Arthritis    Past Medical History  Diagnosis Date  . Seasonal allergies     cats, dust, mold, roaches  . Depression     prior on lexapro, celexa, wellbutrin  . Migraines   . Squamous cell skin cancer   . Abnormal EKG   . Arthritis   . Right knee DJD     Past Surgical History  Procedure Date  . Tonsillectomy and adenoidectomy 1945  . Abdominal hysterectomy 1980  . Bunionectomy 2013    R and L foot  . Cataract extraction 2013    R, pending L  . Cosmetic surgery 2002    face lift     (Not in a hospital admission) No Known Allergies  History  Substance Use  Topics  . Smoking status: Never Smoker   . Smokeless tobacco: Never Used  . Alcohol Use: Yes     occasional    Family History  Problem Relation Age of Onset  . Cancer Mother     lung, smoker  . Cancer Father     lung, smoker  . Cancer Maternal Grandmother 48    leukemia  . Coronary artery disease Paternal Grandmother 70    sudden cardiac death  . Diabetes Neg Hx   . Stroke Neg Hx      Review of Systems  Constitutional: Negative.   HENT: Negative.   Eyes: Negative.   Respiratory: Negative.   Cardiovascular: Negative.   Gastrointestinal: Negative.   Genitourinary: Negative.   Musculoskeletal: Positive for joint pain.       Right knee  Skin: Negative.   Neurological: Negative.   Endo/Heme/Allergies: Negative.   Psychiatric/Behavioral: Negative.     Objective:  Physical Exam  Constitutional: She is oriented to person, place, and time. She appears well-developed and well-nourished.  HENT:  Head: Normocephalic and atraumatic.  Mouth/Throat: Oropharynx is clear and moist.  Eyes: EOM are normal. Pupils are equal, round, and reactive to light.  Neck: Neck supple.  Cardiovascular: Normal rate and regular rhythm.   Respiratory: Effort normal.  GI: Soft.  Genitourinary:       Not pertinent to current symptomatology therefore not examined.  Musculoskeletal:       Examination of her right   knee reveals 1+ effusion diffuse pain, 1+ crepitation, full range of motion knee is stable with normal patellar tracking. Exam of her left knee reveals full range of motion without pain swelling weakness or instability. Vascular exam: pulses 2+ and symmetric.   Neurological: She is alert and oriented to person, place, and time.  Skin: Skin is warm and dry.    Vital signs in last 24 hours: 5'5" 151 125/83 95% room air 98.1 Pulse 92  Labs:   Estimated Body mass index is 25.13 kg/(m^2) as calculated from the following:   Height as of this encounter: 5' 5"(1.651 m).   Weight as of  this encounter: 151 lb(68.493 kg).   Imaging Review Plain radiographs demonstrate severe degenerative joint disease of the right knee(s). The overall alignment ismild varus. The bone quality appears to be good for age and reported activity level.  Assessment/Plan:  End stage arthritis, right knee   The patient history, physical examination, clinical judgment of the provider and imaging studies are consistent with end stage degenerative joint disease of the right knee(s) and total knee arthroplasty is deemed medically necessary. The treatment options including medical management, injection therapy arthroscopy and arthroplasty were discussed at length. The risks and benefits of total knee arthroplasty were presented and reviewed. The risks due to aseptic loosening, infection, stiffness, patella tracking problems, thromboembolic complications and other imponderables were discussed. The patient acknowledged the explanation, agreed to proceed with the plan and consent was signed. Patient is being admitted for inpatient treatment for surgery, pain control, PT, OT, prophylactic antibiotics, VTE prophylaxis, progressive ambulation and ADL's and discharge planning. The patient is planning to be discharged to skilled nursing facility  Kristin Monty A. Jenasis Straley, PA-C Physician Assistant [facility] [phone]  12/21/2011, 3:21 PM 

## 2012-01-02 NOTE — Interval H&P Note (Signed)
History and Physical Interval Note:  01/02/2012 12:05 PM  Kristin Kramer  has presented today for surgery, with the diagnosis of RIGHT KNEE DJD  The various methods of treatment have been discussed with the patient and family. After consideration of risks, benefits and other options for treatment, the patient has consented to  Procedure(s) (LRB) with comments: TOTAL KNEE ARTHROPLASTY (Right) as a surgical intervention .  The patient's history has been reviewed, patient examined, no change in status, stable for surgery.  I have reviewed the patient's chart and labs.  Questions were answered to the patient's satisfaction.     Salvatore Marvel A

## 2012-01-02 NOTE — Anesthesia Preprocedure Evaluation (Addendum)
Anesthesia Evaluation  Patient identified by MRN, date of birth, ID band Patient awake    Reviewed: Allergy & Precautions, H&P , NPO status , Patient's Chart, lab work & pertinent test results  History of Anesthesia Complications (+) PONV  Airway Mallampati: II TM Distance: >3 FB Neck ROM: full    Dental  (+) Teeth Intact and Dental Advidsory Given   Pulmonary          Cardiovascular     Neuro/Psych  Headaches, Depression    GI/Hepatic   Endo/Other    Renal/GU      Musculoskeletal   Abdominal   Peds  Hematology   Anesthesia Other Findings   Reproductive/Obstetrics                           Anesthesia Physical Anesthesia Plan  ASA: II  Anesthesia Plan: General   Post-op Pain Management:    Induction: Intravenous  Airway Management Planned: LMA  Additional Equipment:   Intra-op Plan:   Post-operative Plan: Extubation in OR  Informed Consent: I have reviewed the patients History and Physical, chart, labs and discussed the procedure including the risks, benefits and alternatives for the proposed anesthesia with the patient or authorized representative who has indicated his/her understanding and acceptance.     Plan Discussed with: CRNA and Surgeon  Anesthesia Plan Comments:        Anesthesia Quick Evaluation

## 2012-01-02 NOTE — Preoperative (Signed)
Beta Blockers   Reason not to administer Beta Blockers:Not Applicable 

## 2012-01-02 NOTE — Anesthesia Postprocedure Evaluation (Signed)
  Anesthesia Post-op Note  Patient: Kristin Kramer  Procedure(s) Performed: Procedure(s) (LRB) with comments: TOTAL KNEE ARTHROPLASTY (Right) - right total knee replacement  Patient Location: PACU  Anesthesia Type: No value filed.   Level of Consciousness: awake, alert , oriented and patient cooperative  Airway and Oxygen Therapy: Patient Spontanous Breathing and Patient connected to nasal cannula oxygen  Post-op Pain: mild  Post-op Assessment: Post-op Vital signs reviewed, Patient's Cardiovascular Status Stable, Respiratory Function Stable, Patent Airway, No signs of Nausea or vomiting and Pain level controlled  Post-op Vital Signs: Reviewed and stable  Complications: No apparent anesthesia complications

## 2012-01-02 NOTE — Anesthesia Procedure Notes (Addendum)
Procedure Name: LMA Insertion Date/Time: 01/02/2012 1:05 PM Performed by: Rossie Muskrat L Pre-anesthesia Checklist: Patient identified, Timeout performed, Emergency Drugs available, Suction available and Patient being monitored Patient Re-evaluated:Patient Re-evaluated prior to inductionOxygen Delivery Method: Circle system utilized Preoxygenation: Pre-oxygenation with 100% oxygen Intubation Type: IV induction Ventilation: Mask ventilation without difficulty LMA: LMA inserted LMA Size: 4.0 Number of attempts: 1 Placement Confirmation: breath sounds checked- equal and bilateral and positive ETCO2 Tube secured with: Tape Dental Injury: Teeth and Oropharynx as per pre-operative assessment    Anesthesia Regional Block:  Femoral nerve block  Pre-Anesthetic Checklist: ,, timeout performed, Correct Patient, Correct Site, Correct Laterality, Correct Procedure, Correct Position, site marked, Risks and benefits discussed,  Surgical consent,  Pre-op evaluation,  At surgeon's request and post-op pain management  Laterality: Right  Prep: chloraprep       Needles:  Injection technique: Single-shot  Needle Type: Echogenic Stimulator Needle     Needle Length:cm 9 cm Needle Gauge: 21 G    Additional Needles:  Procedures: ultrasound guided and nerve stimulator Femoral nerve block  Nerve Stimulator or Paresthesia:  Response: 0.4 mA,   Additional Responses:   Narrative:  Start time: 01/02/2012 12:10 PM End time: 01/02/2012 12:25 PM Injection made incrementally with aspirations every 5 mL.  Performed by: Personally  Anesthesiologist: Arta Bruce MD  Additional Notes: Monitors applied. Patient sedated. Sterile prep and drape,hand hygiene and sterile gloves were used. Relevant anatomy identified.Needle position confirmed.Local anesthetic injected incrementally after negative aspiration. Local anesthetic spread visualized around nerve(s). Vascular puncture avoided. No complications.  Image printed for medical record.The patient tolerated the procedure well.       Femoral nerve block

## 2012-01-02 NOTE — Progress Notes (Signed)
Patient states that Dr. Thurston Hole is aware she is a Jehovah's Witness and refuses blood.

## 2012-01-03 ENCOUNTER — Inpatient Hospital Stay (HOSPITAL_COMMUNITY): Payer: Medicare Other

## 2012-01-03 ENCOUNTER — Encounter: Payer: Self-pay | Admitting: Family Medicine

## 2012-01-03 DIAGNOSIS — Z79899 Other long term (current) drug therapy: Secondary | ICD-10-CM | POA: Diagnosis not present

## 2012-01-03 DIAGNOSIS — N390 Urinary tract infection, site not specified: Secondary | ICD-10-CM | POA: Diagnosis not present

## 2012-01-03 DIAGNOSIS — R072 Precordial pain: Secondary | ICD-10-CM

## 2012-01-03 DIAGNOSIS — M1710 Unilateral primary osteoarthritis, unspecified knee: Secondary | ICD-10-CM | POA: Diagnosis not present

## 2012-01-03 DIAGNOSIS — A499 Bacterial infection, unspecified: Secondary | ICD-10-CM | POA: Diagnosis present

## 2012-01-03 DIAGNOSIS — R079 Chest pain, unspecified: Secondary | ICD-10-CM

## 2012-01-03 DIAGNOSIS — R0602 Shortness of breath: Secondary | ICD-10-CM | POA: Diagnosis not present

## 2012-01-03 DIAGNOSIS — D62 Acute posthemorrhagic anemia: Secondary | ICD-10-CM | POA: Diagnosis not present

## 2012-01-03 DIAGNOSIS — R071 Chest pain on breathing: Secondary | ICD-10-CM | POA: Diagnosis present

## 2012-01-03 LAB — CBC
HCT: 34.9 % — ABNORMAL LOW (ref 36.0–46.0)
Hemoglobin: 11.5 g/dL — ABNORMAL LOW (ref 12.0–15.0)
RBC: 3.98 MIL/uL (ref 3.87–5.11)
WBC: 10.3 10*3/uL (ref 4.0–10.5)

## 2012-01-03 LAB — TROPONIN I
Troponin I: <0.3 ng/mL (ref <0.30)
Troponin I: <0.3 ng/mL (ref <0.30)

## 2012-01-03 LAB — CK TOTAL AND CKMB (NOT AT ARMC)
CK, MB: 3.1 ng/mL (ref 0.3–4.0)
Relative Index: 0.9 (ref 0.0–2.5)
Total CK: 469 U/L — ABNORMAL HIGH (ref 7–177)

## 2012-01-03 LAB — BASIC METABOLIC PANEL
BUN: 9 mg/dL (ref 6–23)
Chloride: 102 mEq/L (ref 96–112)
GFR calc Af Amer: >90 mL/min (ref >90)
Glucose, Bld: 167 mg/dL — ABNORMAL HIGH (ref 70–99)
Potassium: 4.1 mEq/L (ref 3.5–5.1)

## 2012-01-03 MED ORDER — ASPIRIN EC 81 MG PO TBEC
81.0000 mg | DELAYED_RELEASE_TABLET | Freq: Every day | ORAL | Status: DC
  Administered 2012-01-03 – 2012-01-05 (×3): 81 mg via ORAL
  Filled 2012-01-03 (×3): qty 1

## 2012-01-03 MED ORDER — CIPROFLOXACIN HCL 500 MG PO TABS
500.0000 mg | ORAL_TABLET | Freq: Two times a day (BID) | ORAL | Status: DC
  Administered 2012-01-03 – 2012-01-05 (×5): 500 mg via ORAL
  Filled 2012-01-03 (×7): qty 1

## 2012-01-03 MED ORDER — LORAZEPAM 0.5 MG PO TABS
0.2500 mg | ORAL_TABLET | Freq: Every evening | ORAL | Status: DC | PRN
  Administered 2012-01-03 – 2012-01-04 (×2): 0.25 mg via ORAL
  Filled 2012-01-03 (×2): qty 1

## 2012-01-03 MED ORDER — ALPRAZOLAM 0.25 MG PO TABS
0.2500 mg | ORAL_TABLET | Freq: Three times a day (TID) | ORAL | Status: DC | PRN
  Administered 2012-01-03 – 2012-01-04 (×3): 0.25 mg via ORAL
  Filled 2012-01-03 (×3): qty 1

## 2012-01-03 MED ORDER — PANTOPRAZOLE SODIUM 40 MG PO TBEC
40.0000 mg | DELAYED_RELEASE_TABLET | Freq: Two times a day (BID) | ORAL | Status: DC
  Administered 2012-01-03 – 2012-01-05 (×4): 40 mg via ORAL
  Filled 2012-01-03 (×5): qty 1

## 2012-01-03 NOTE — Anesthesia Postprocedure Evaluation (Signed)
  Anesthesia Post-op Note  Patient: Kristin Kramer  Procedure(s) Performed: Procedure(s) (LRB) with comments: TOTAL KNEE ARTHROPLASTY (Right) - right total knee replacement  Patient Location: PACU  Anesthesia Type:GA combined with regional for post-op pain  Level of Consciousness: awake, alert , oriented and patient cooperative  Airway and Oxygen Therapy: Patient Spontanous Breathing  Post-op Pain: mild  Post-op Assessment: Post-op Vital signs reviewed, Patient's Cardiovascular Status Stable, Respiratory Function Stable, Patent Airway, No signs of Nausea or vomiting and Pain level controlled  Post-op Vital Signs: Reviewed and stable  Complications: No apparent anesthesia complications

## 2012-01-03 NOTE — Progress Notes (Signed)
Order received and plan is for pt to go to SNF. Will defer OT eval to that facility. Acute OT will sign off.

## 2012-01-03 NOTE — Consult Note (Signed)
Reason for Consult:Chest Pain  Referring Physician: Dr. Huntley Dec is an 71 y.o. female.   HPI: 71 y/o female with a PMH of hyperlipidemia who is currently been seen in consultation with Dr. Dion Saucier for evaluation of chest pain.  The patient complains of 7 months history of intermittent chest pain with and without exertion.  The chest pain is described as tightness, 4/10 in severity, and it typically last about less than 1 minute before spontaneous relief.  The chest pain are usually not associated with shortness of breath, radiation, diaphoresis, nausea or vomiting. She also denies any PND, orthopnea, palpitation or syncope. As part of her pre-op evaluation for a right knee DJD, she underwent ECG which showed sinus rhythm with incomplete right bundle branch pattern and left anterior fascicular block (no old ECG for comparison). She has been concerned "worried" about these ECG changes.  She underwent total knee arthroplasty yesterday, and developed post-op chest pain that was similar to her previously described chest pain.  The post-op chest pain lasted for 1 hour before spontaneous relief, and it was associated with shortness of breath.  Her repeat ECG did not show any new changes suggestive of ischemia.  She denies any cigarette smoking, and she has no family history of early coronary artery disease. She is currently chest pain free, and she is clinically and hemodynamically stable.    Past Medical History  Diagnosis Date  . Seasonal allergies     cats, dust, mold, roaches  . Depression     prior on lexapro, celexa, wellbutrin  . Migraines   . Squamous cell skin cancer   . Abnormal EKG   . Arthritis   . Right knee DJD   . Complication of anesthesia   . PONV (postoperative nausea and vomiting)   . Abnormal finding on EKG     seen by Dr. Mariah Milling, clearance frm cardiologist  "no further cardiac workup needed"  . Pneumonia     hx of    Past Surgical History  Procedure Date  .  Tonsillectomy and adenoidectomy 1945  . Abdominal hysterectomy 1980  . Bunionectomy 2013    R and L foot  . Cataract extraction 2013    R, pending L  . Cosmetic surgery 2002    face lift  . Eye surgery     bilateral cataract surgery    Family History  Problem Relation Age of Onset  . Cancer Mother     lung, smoker  . Cancer Father     lung, smoker  . Cancer Maternal Grandmother 48    leukemia  . Coronary artery disease Paternal Grandmother 65    sudden cardiac death  . Diabetes Neg Hx   . Stroke Neg Hx     Social History:  reports that she has never smoked. She has never used smokeless tobacco. She reports that she drinks alcohol. She reports that she does not use illicit drugs.  Allergies: No Known Allergies  Medications:  Calcium Carbonate 600mg  bid Cymbalta 20mg  qd  Results for orders placed during the hospital encounter of 01/02/12 (from the past 48 hour(s))  NO BLOOD PRODUCTS     Status: Normal   Collection Time   01/02/12 10:29 AM      Component Value Range Comment   Transfuse no blood products TRANSFUSE NO BLOOD PRODUCTS, VERIFIED BY TMOORE RN       Dg Chest Port 1 View  01/03/2012  *RADIOLOGY REPORT*  Clinical Data: Chest pain and shortness  of breath.  PORTABLE CHEST - 1 VIEW  Comparison: PA and lateral chest 12/27/2011.  Findings: Lung volumes are lower than on the prior study with some crowding of the bronchovascular structures.  No consolidative process, pneumothorax or effusion is identified.  No evidence of pulmonary edema is seen.  Heart size is normal.  IMPRESSION: No acute finding.   Original Report Authenticated By: Bernadene Bell. Maricela Curet, M.D.     Review of Systems  Constitutional: Negative for fever, chills, weight loss, malaise/fatigue and diaphoresis.  HENT: Negative for hearing loss, ear pain, nosebleeds, congestion, sore throat, neck pain, tinnitus and ear discharge.   Eyes: Negative for blurred vision, double vision, photophobia, pain and  discharge.  Respiratory: Positive for shortness of breath. Negative for cough, hemoptysis, sputum production, wheezing and stridor.   Cardiovascular: Positive for chest pain. Negative for palpitations, orthopnea, claudication, leg swelling and PND.  Gastrointestinal: Negative for heartburn, nausea, vomiting, abdominal pain, diarrhea, constipation, blood in stool and melena.  Genitourinary: Negative for dysuria, urgency, frequency, hematuria and flank pain.  Musculoskeletal: Negative for myalgias, back pain, joint pain and falls.  Skin: Negative for itching and rash.  Neurological: Negative for dizziness, tingling, tremors, sensory change, weakness and headaches.  Endo/Heme/Allergies: Negative for environmental allergies and polydipsia. Does not bruise/bleed easily.  Psychiatric/Behavioral: Negative for suicidal ideas and hallucinations.   Blood pressure 108/59, pulse 66, temperature 98 F (36.7 C), temperature source Tympanic, resp. rate 18, SpO2 99.00%. Physical Exam  Constitutional: She is oriented to person, place, and time. She appears well-developed and well-nourished. No distress.  HENT:  Head: Normocephalic.  Eyes: EOM are normal. Right eye exhibits no discharge. Left eye exhibits no discharge.  Neck: Normal range of motion. No JVD present.  Cardiovascular: Normal rate, regular rhythm and normal heart sounds.  Exam reveals no friction rub.   No murmur heard. Respiratory: Effort normal and breath sounds normal. No stridor. She has no wheezes. She has no rales. She exhibits no tenderness.  GI: She exhibits no distension. There is no tenderness. There is no rebound and no guarding.  Musculoskeletal: She exhibits no edema and no tenderness.  Neurological: She is alert and oriented to person, place, and time.  Skin: No rash noted. She is not diaphoretic. No erythema.  Psychiatric: She has a normal mood and affect.    Assessment/Plan:  1. Chest Pain 2. Hyperlipidemia 3. Right knee  DJD s/p total arthroplasty 01/02/2012  Patient has chest pains that has been present for the last 7 months with some typical and atypical features and ECG abnormalities that are not suggestive of definite ischemia.  Her risk factors for coronary artery disease include her age and hyperlipidemia. She admits to anxiety which could be contributing to her chest pain; however female patients can have atypical presentation.  I recommend the following:  1. Serial cardiac markers x 3 to rule out myocardial infarction (about 8 hours apart) 2. Aspirin 81 mg p o daily (if safe from the surgical standpoint) 3. Obtain a transthoracic echocardiogram in the morning to evaluate her left ventricular systolic and diastolic function 4. Nuclear stress test prior to discharge from the hospital for risk stratification  Chett Taniguchi E 01/03/2012, 5:26 AM

## 2012-01-03 NOTE — Evaluation (Signed)
Physical Therapy Evaluation Patient Details Name: Kristin Kramer MRN: [id number] DOB: Dec 17, 1940 Today's Date: 01/03/2012 Time: 7829-5621 PT Time Calculation (min): 29 min  PT Assessment / Plan / Recommendation Clinical Impression  pt presents with R TKA.  pt very motivated to improve mobility and plans to D/C to rehab since she lives home alone.  Anticipate great progress.      PT Assessment  Patient needs continued PT services    Follow Up Recommendations  Post acute inpatient    Does the patient have the potential to tolerate intense rehabilitation   No, Recommend SNF  Barriers to Discharge None      Equipment Recommendations  Rolling walker with 5" wheels;3 in 1 bedside comode    Recommendations for Other Services OT consult   Frequency 7X/week    Precautions / Restrictions Precautions Precautions: Knee Required Braces or Orthoses: Knee Immobilizer - Right Knee Immobilizer - Right: Discontinue post op day 2 Restrictions Weight Bearing Restrictions: Yes RLE Weight Bearing: Weight bearing as tolerated   Pertinent Vitals/Pain Pt indicates not so much painful, just cramping in R calf.        Mobility  Bed Mobility Bed Mobility: Supine to Sit;Sitting - Scoot to Edge of Bed Supine to Sit: 4: Min assist;With rails Sitting - Scoot to [place] of Bed: 4: Min guard Details for Bed Mobility Assistance: Cues for sequencing.  A only for R LE.   Transfers Transfers: Sit to Stand;Stand to Sit Sit to Stand: 4: Min assist;With upper extremity assist;From bed Stand to Sit: 4: Min guard;With upper extremity assist;To chair/3-in-1;With armrests Details for Transfer Assistance: cues for use of UEs and LE positioning priro to sitting.   Ambulation/Gait Ambulation/Gait Assistance: 4: Min guard Ambulation Distance (Feet): 150 Feet Assistive device: Rolling walker Ambulation/Gait Assistance Details: cues for sequencing, upright posture, increased step length with L LE.   Gait Pattern:  Step-through pattern;Decreased step length - left;Decreased stance time - right;Trunk flexed Stairs: No Wheelchair Mobility Wheelchair Mobility: No    Shoulder Instructions     Exercises Total Joint Exercises Ankle Circles/Pumps: AROM;Both;10 reps Quad Sets: AROM;Both;10 reps Long Arc Quad: AAROM;Right;10 reps Knee Flexion: AAROM;Right;10 reps   PT Diagnosis: Abnormality of gait;Acute pain  PT Problem List: Decreased strength;Decreased range of motion;Decreased activity tolerance;Decreased balance;Decreased mobility;Decreased knowledge of use of DME;Pain PT Treatment Interventions: DME instruction;Gait training;Stair training;Functional mobility training;Therapeutic activities;Therapeutic exercise;Balance training;Patient/family education   PT Goals Acute Rehab PT Goals PT Goal Formulation: With patient Time For Goal Achievement: 01/10/12 Potential to Achieve Goals: Good Pt will go Supine/Side to Sit: with modified independence PT Goal: Supine/Side to Sit - Progress: Goal set today Pt will go Sit to Supine/Side: with modified independence PT Goal: Sit to Supine/Side - Progress: Goal set today Pt will go Sit to Stand: with modified independence PT Goal: Sit to Stand - Progress: Goal set today Pt will go Stand to Sit: with modified independence PT Goal: Stand to Sit - Progress: Goal set today Pt will Ambulate: >150 feet;with supervision;with rolling walker PT Goal: Ambulate - Progress: Goal set today Pt will Perform Home Exercise Program: Independently PT Goal: Perform Home Exercise Program - Progress: Goal set today  Visit Information  Last PT Received On: 01/03/12 Assistance Needed: +1    Subjective Data  Subjective: I'm planning on going to rehab before I go home.   Patient Stated Goal: Walk   Prior Functioning  Home Living Lives With: Alone Available Help at Discharge: Skilled Nursing Facility Additional Comments: pt plans on  D/C to SNF as she lives alone.   Prior  Function Level of Independence: Independent Able to Take Stairs?: Yes Driving: Yes Vocation: Retired Musician: No difficulties    Cognition  Overall Cognitive Status: Appears within functional limits for tasks assessed/performed Arousal/Alertness: Awake/alert Orientation Level: Appears intact for tasks assessed Behavior During Session: [facility] for tasks performed    Extremity/Trunk Assessment Right Lower Extremity Assessment RLE ROM/Strength/Tone: Deficits RLE ROM/Strength/Tone Deficits: AAROM ~10- 80 RLE Sensation: WFL - Light Touch Left Lower Extremity Assessment LLE ROM/Strength/Tone: WFL for tasks assessed LLE Sensation: WFL - Light Touch Trunk Assessment Trunk Assessment: Normal   Balance Balance Balance Assessed: No  End of Session PT - End of Session Equipment Utilized During Treatment: Gait belt Activity Tolerance: Patient tolerated treatment well Patient left: in chair;with call bell/phone within reach;with family/visitor present Nurse Communication: Mobility status CPM Right Knee CPM Right Knee: Off  GP     Sunny Schlein, [place] [phone] 01/03/2012, 11:15 AM

## 2012-01-03 NOTE — Progress Notes (Signed)
UR COMPLETED  

## 2012-01-03 NOTE — Progress Notes (Signed)
Patient with complaints around 0300 of non-radiating pain to the left lower side of her chest that she had sustained earlier in the night beginning around 0100 that lasted for less than an hour.  Patient did not report this pain until 0300.  While not in current pain at time she told RN, patient described pain earlier in the night to have felt tight to the left lower side of her chest, and had felt slightly SOB, but denies dizziness or nauea/vomiting.  Patient states this pain had been occurring off and on since Friday but did not want her surgery cancelled as to why she did not report it prior.  Patient without any current chest pain at 0300 when pain was first reported, and no current SOB or dizziness, nausea, vomiting reported. Vital signs stable.  Temp 98.0-BP 108/59, HR 66, RR 20- pulse ox at 98% on 2L O2.    EKG performed, showing normal sinus rhythm with left axis deviation.  Spoke with Dr. Dion Saucier covering for Dr. Thurston Hole.  New orders for patient to be placed on telemetry, STAT cardiac enzymes x3, and STAT portable chest x-ray and consult to cardiology placed.  Chest X-ray and cardiac enzymes obtained and pending.  Patient placed on telemetry, currently in NSR. Cardiology in to see patient, with recommendations written.  No further complaints of chest pain at this time.  Nursing will continue to monitor.

## 2012-01-03 NOTE — Progress Notes (Signed)
Patient ID: Kristin Kramer, female   DOB: [birth year], 71 y.o.   MRN: [id number] PATIENT ID: Kristin Kramer  MRN: [id number]  DOB/AGE:  February 15, 1941 / 71 y.o.  1 Day Post-Op Procedure(s) (LRB): TOTAL KNEE ARTHROPLASTY (Right)    PROGRESS NOTE Subjective: Patient is alert, oriented, no Nausea, no Vomiting, yes passing gas, no Bowel Movement. Taking PO well Complains of SOB and chest pain.   Using Incentive Spirometer, PAS in place. Ambulate well yesterday, CPM 0-60 Patient reports pain as 4 on 0-10 scale  .    Objective: Vital signs in last 24 hours: Filed Vitals:   01/03/12 0220 01/03/12 0321 01/03/12 0436 01/03/12 0615  BP: 108/57 108/59  114/56  Pulse: 83 66  65  Temp: 98.6 F (37 C) 98 F (36.7 C)  98.2 F (36.8 C)  TempSrc: Oral Tympanic  Oral  Resp: 20 20 18 20   SpO2: 96% 98% 99% 99%      Intake/Output from previous day: I/O last 3 completed shifts: In: 1240 [P.O.:240; I.V.:1000] Out: 2675 [Urine:2300; Drains:375]   Intake/Output this shift:     LABORATORY DATA:  Basename 01/03/12 0545  WBC 10.3  HGB 11.5*  HCT 34.9*  PLT 217  NA 139  K 4.1  CL 102  CO2 29  BUN 9  CREATININE 0.61  GLUCOSE 167*  GLUCAP --  INR --  CALCIUM 9.0    Examination: ABD soft Neurovascular intact Sensation intact distally Intact pulses distally Dorsiflexion/Plantar flexion intact Incision: dressing C/D/I}  Assessment:   1 Day Post-Op Procedure(s) (LRB): TOTAL KNEE ARTHROPLASTY (Right) ADDITIONAL DIAGNOSIS:  Acute Blood Loss Anemia and UTI Preop Principal Problem:  *Right knee DJD Active Problems:  Depression  Migraines  Arthritis  Hyperlipidemia  Seasonal allergies  Chest pain on respiration  Plan: PT/OT WBAT, CPM 5/hrs day until ROM 0-90 degrees, then D/C CPM DVT Prophylaxis:  SCDx72hrs, ASA 325 mg BID x 2 weeks DISCHARGE PLAN: Skilled Nursing Facility/Rehab DISCHARGE NEEDS: FL2 Ordered 2D echo with contrast after discussing the patient with Trish PA-C with [facility].   Will hold on stress test until out patient.  Had a discussion with the patient that chest pain has 3 main etiologies, heart, reflux and anxiety.  Will address this with xanax prn during the day and protonix for reflux.  Will continue CIPRO for preop UTI e coli.  Plan on SNF Wednesday or Thursday.    Kristin Kramer 01/03/2012, 7:59 AM

## 2012-01-03 NOTE — Addendum Note (Signed)
Addendum  created 01/03/12 0930 by Germaine Pomfret, MD   Modules edited:Notes Section

## 2012-01-03 NOTE — Progress Notes (Signed)
CARE MANAGEMENT NOTE 01/03/2012  Patient:  [facility]   Account Number:  [id number]  Date Initiated:  01/03/2012  Documentation initiated by:  Vance Peper  Subjective/Objective Assessment:   71 yr old female s/p right total knee arthroplasty.     Action/Plan:   Patient will be going to [facility] for shortterm rehab.   Anticipated DC Date:  01/04/2012   Anticipated DC Plan:  SKILLED NURSING FACILITY  In-house referral  Clinical Social Worker      DC Planning Services  CM consult      Choice offered to / List presented to:             Status of service:  Completed, signed off Medicare Important Message given?   (If response is "NO", the following Medicare IM given date fields will be blank) Date Medicare IM given:   Date Additional Medicare IM given:    Discharge Disposition:  SKILLED NURSING FACILITY  Per UR Regulation:    If discussed at Long Length of Stay Meetings, dates discussed:    Comments:

## 2012-01-03 NOTE — Progress Notes (Addendum)
Called regarding onset of chest pain.   No current chest pain, but had chest pain earlier tonight, and apparently has been having episodic chest pain since Friday, but did not tell anyone because she didn't want her surgery cancelled.  Also having pain with deep inspiration/breathing.  BP 108/59  Pulse 66  Temp 98 F (36.7 C) (Tympanic)  Resp 20  SpO2 98%  New labs pending.  EKG was done, and per report from nursing had NSR with some axis deviation consistent with previous ekgs.  Will add cardiac enzymes to current lab draw, as lab is on the floor now for her morning labs, and get a stat cxr and consult the [facility] Cardiology service, as this is the service who evaluated her abnormal ekg preoperatively.

## 2012-01-04 ENCOUNTER — Encounter (HOSPITAL_COMMUNITY): Payer: Self-pay | Admitting: General Practice

## 2012-01-04 ENCOUNTER — Inpatient Hospital Stay (HOSPITAL_COMMUNITY): Payer: Medicare Other

## 2012-01-04 DIAGNOSIS — R071 Chest pain on breathing: Secondary | ICD-10-CM | POA: Diagnosis not present

## 2012-01-04 DIAGNOSIS — J90 Pleural effusion, not elsewhere classified: Secondary | ICD-10-CM | POA: Diagnosis not present

## 2012-01-04 DIAGNOSIS — D62 Acute posthemorrhagic anemia: Secondary | ICD-10-CM | POA: Diagnosis not present

## 2012-01-04 DIAGNOSIS — J9819 Other pulmonary collapse: Secondary | ICD-10-CM | POA: Diagnosis not present

## 2012-01-04 DIAGNOSIS — R079 Chest pain, unspecified: Secondary | ICD-10-CM | POA: Diagnosis not present

## 2012-01-04 LAB — BASIC METABOLIC PANEL
BUN: 11 mg/dL (ref 6–23)
CO2: 31 mEq/L (ref 19–32)
Calcium: 9.1 mg/dL (ref 8.4–10.5)
Chloride: 103 mEq/L (ref 96–112)
Creatinine, Ser: 0.66 mg/dL (ref 0.50–1.10)
GFR calc non Af Amer: 87 mL/min — ABNORMAL LOW (ref >90)
Glucose, Bld: 130 mg/dL — ABNORMAL HIGH (ref 70–99)
Potassium: 3.8 mEq/L (ref 3.5–5.1)

## 2012-01-04 LAB — CBC
Platelets: 191 10*3/uL (ref 150–400)
RBC: 3.55 MIL/uL — ABNORMAL LOW (ref 3.87–5.11)
RDW: 12.6 % (ref 11.5–15.5)
WBC: 8 10*3/uL (ref 4.0–10.5)

## 2012-01-04 LAB — TROPONIN I: Troponin I: <0.3 ng/mL (ref <0.30)

## 2012-01-04 MED ORDER — BISACODYL 10 MG RE SUPP
10.0000 mg | Freq: Once | RECTAL | Status: AC
  Administered 2012-01-04: 10 mg via RECTAL
  Filled 2012-01-04: qty 1

## 2012-01-04 MED ORDER — REGADENOSON 0.4 MG/5ML IV SOLN
0.4000 mg | Freq: Once | INTRAVENOUS | Status: DC
  Filled 2012-01-04: qty 5

## 2012-01-04 MED ORDER — ATORVASTATIN CALCIUM 40 MG PO TABS
40.0000 mg | ORAL_TABLET | Freq: Every day | ORAL | Status: DC
Start: 2012-01-04 — End: 2012-01-05
  Administered 2012-01-04: 40 mg via ORAL
  Filled 2012-01-04 (×2): qty 1

## 2012-01-04 MED ORDER — IOHEXOL 300 MG/ML  SOLN
80.0000 mL | Freq: Once | INTRAMUSCULAR | Status: AC | PRN
  Administered 2012-01-04: 80 mL via INTRAVENOUS

## 2012-01-04 NOTE — Progress Notes (Signed)
Patient with brief episode of lower left chest tightness last night around 2240 similar to what she experienced the previous night.  Patient without SOB, denies radiating pain, nausea, vomiting, or dizziness.  Vital signs stable.  Patient medicated prior to reported chest pain with Oxycodone for knee pain and Ativan for sleep.  Heat pack applied to area.  Patient later reported that pain had gone away, no further complaints of chest pain.  Nursing will continue to monitor.

## 2012-01-04 NOTE — Progress Notes (Signed)
Patient Name: Kristin Kramer Date of Encounter: 01/04/2012     Principal Problem:  *Right knee DJD Active Problems:  Depression  Migraines  Arthritis  Hyperlipidemia  Seasonal allergies  Chest pain on respiration  Bacterial UTI Ecoli  Postoperative anemia due to acute blood loss    SUBJECTIVE: Describes post-op chest pain as "pressure," left-sided, without radiation, assoc w/ SOB lasting for approx 2 hours. No change with position/inspiration. No association with meals. She has experienced several of these episodes intermittently over the past year. No association with exertion. She had another episode of chest pain this AM, described as "sharp, side-pain" and different from her original cp complaint. Of note, she has seen Dr. Mariah Milling in [place] for HL.    OBJECTIVE  Filed Vitals:   01/03/12 2008 01/03/12 2240 01/04/12 0058 01/04/12 0637  BP: 104/57 110/59  112/62  Pulse: 65 66  60  Temp: 98.2 F (36.8 C) 97.7 F (36.5 C)  97.7 F (36.5 C)  TempSrc:      Resp: 18 18 18 18   SpO2: 97% 98% 93% 95%    Intake/Output Summary (Last 24 hours) at 01/04/12 1248 Last data filed at 01/03/12 1900  Gross per 24 hour  Intake    480 ml  Output    700 ml  Net   -220 ml   Weight change:   PHYSICAL EXAM  General: Well developed, well nourished, in no acute distress. Head: Normocephalic, atraumatic, sclera non-icteric, no xanthomas, nares are without discharge.  Neck: Supple without bruits or JVD. Lungs:  Resp regular and unlabored, CTA. Heart: RRR no s3, s4, or murmurs. Abdomen:  Soft, non-tender, non-distended, BS + x 4.  Msk:  Strength and tone appears normal for age. Extremities: No clubbing, cyanosis or edema. DP/PT/Radials 2+ and equal bilaterally. Neuro: Alert and oriented X 3. Moves all extremities spontaneously. Psych:  Normal affect.  LABS:  Recent Labs  [facility] 01/04/12 0510 01/03/12 0545   WBC 8.0 10.3   HGB 10.2* 11.5*   HCT 31.3* 34.9*   MCV 88.2 87.7   PLT 191 217   Lab 01/04/12 0510 01/03/12 0545  NA 139 139  K 3.8 4.1  CL 103 102  CO2 31 29  BUN 11 9  CREATININE 0.66 0.61  CALCIUM 9.1 9.0  PROT -- --  BILITOT -- --  ALKPHOS -- --  ALT -- --  AST -- --  AMYLASE -- --  LIPASE -- --  GLUCOSE 130* 167*   Recent Labs  Basename 01/04/12 0510 01/03/12 2308 01/03/12 1704 01/03/12 0944 01/03/12 0545   CKTOTAL -- -- 469* 348* 329*   CKMB -- -- 3.1 2.7 3.0   CKMBINDEX -- -- -- -- --   TROPONINI <0.30 <0.30 <0.30 -- --   TELE: NSR, 60-80 bpm, occasional PACs, transient sinus brady (high 50s)  Radiology/Studies:  Dg Chest 2 View  12/27/2011  *RADIOLOGY REPORT*  Clinical Data: Preop for right knee arthroplasty  CHEST - 2 VIEW  Comparison: None.  Findings: Cardiomediastinal silhouette is unremarkable.  No acute infiltrate or pleural effusion.  No pulmonary edema.  Bony thorax is unremarkable.  IMPRESSION: No active disease.   Original Report Authenticated By: Natasha Mead, M.D.    Ct Chest W Contrast  01/04/2012  *RADIOLOGY REPORT*  Clinical Data: Left-sided chest pain.  Status post right knee surgery on Monday.  CT CHEST WITH CONTRAST  Technique:  Multidetector CT imaging of the chest was performed following the standard protocol during bolus administration  of intravenous contrast.  Contrast: 80mL OMNIPAQUE IOHEXOL 300 MG/ML  SOLN  Comparison: Plain film of 1 day prior.  No prior CT.  Findings: Lungs/pleura: Biapical pleural thickening.  Mild linear atelectasis in the right lower lobe. Minimal left-sided major fissural thickening on image 22.  Trace bilateral pleural fluid.  Heart/Mediastinum: No central pulmonary embolism, on this non- dedicated study.  Bilateral breast implants. Normal aortic caliber without dissection.  Borderline cardiomegaly, without pericardial effusion. No mediastinal or hilar adenopathy.  Upper abdomen: Focal steatosis adjacent the falciform ligament.  Bones/Musculoskeletal:  A hemangioma within the left side of the  thoracic spine.  IMPRESSION:  1. No acute process or explanation for left-sided chest pain. Please note that the exam was not performed to evaluate for pulmonary embolism.  IV access was insufficient for appropriate contrast rate.  If this is an ongoing clinical concern, consider ventilation perfusion study. 2.  Trace bilateral pleural effusions.   Original Report Authenticated By: Consuello Bossier, M.D.    Dg Chest Port 1 View  01/03/2012  *RADIOLOGY REPORT*  Clinical Data: Chest pain and shortness of breath.  PORTABLE CHEST - 1 VIEW  Comparison: PA and lateral chest 12/27/2011.  Findings: Lung volumes are lower than on the prior study with some crowding of the bronchovascular structures.  No consolidative process, pneumothorax or effusion is identified.  No evidence of pulmonary edema is seen.  Heart size is normal.  IMPRESSION: No acute finding.   Original Report Authenticated By: Bernadene Bell. Maricela Curet, M.D.     Current Medications:     . acetaminophen  1,000 mg Intravenous Q6H  . aspirin EC  81 mg Oral Daily  . bisacodyl  10 mg Oral QAC supper  . bisacodyl  10 mg Rectal Once  . calcium carbonate  1,250 mg Oral BID WC  . celecoxib  200 mg Oral Q12H  . cholecalciferol  5,000 Units Oral Daily  . ciprofloxacin  500 mg Oral BID  . dexamethasone  10 mg Oral Daily   Or  . dexamethasone  10 mg Intravenous Daily  . docusate sodium  100 mg Oral BID  . DULoxetine  20 mg Oral Daily  . enoxaparin (LOVENOX) injection  30 mg Subcutaneous Q12H  . pantoprazole  40 mg Oral BID AC    ASSESSMENT AND PLAN:  71yo female w/ no prior cardiac history, HL, R knee DJD s/p arthroplasty 10/28 c/o intermittent post-operative chest pain. Seen in consultation yesterday.   1. Chest pain- Atypical > typical features. Patient has effectively ruled out. EKG yest w/o ischemic changes. 2D echo yest showed LVEF 55-60%, no WMAs, no dd, no valvular dz or effusion; PASP 34 mmHg. Continue low-dose ASA. Low-pretest probability  for cardiac ischemia. Does have significant cardiac RFs in age and HL. Plan for [facility] prior to discharge. If normal, consider alternative etiologies such as GERD and anxiety to chest pain. Of note, chest CT (non-angiogram) did not reveal etiology to chest pain. Low suspicion for PE given lack of tachycardia, tachypnea or hypoxia.   2. Hyperlipidemia- LDL 160 on 9/26. Start statin. Check LFTs 6 wks.   3. GERD- continue PPI.  4. UTI- Cipro per primary team.    Signed, R. Hurman Horn, PA-C 01/04/2012, 12:48 PM

## 2012-01-04 NOTE — Progress Notes (Signed)
Physical Therapy Treatment Note   01/04/12 0901  PT Visit Information  Last PT Received On 01/04/12  Assistance Needed +1  PT Time Calculation  PT Start Time 0907  PT Stop Time 0936  PT Time Calculation (min) 29 min  Subjective Data  Subjective Pt reports "I'm going for CT of my chest at some point today."  Precautions  Precautions Knee  Restrictions  RLE Weight Bearing WBAT  Cognition  Overall Cognitive Status Appears within functional limits for tasks assessed/performed  Arousal/Alertness Awake/alert  Orientation Level Appears intact for tasks assessed  Behavior During Session [facility] for tasks performed  Bed Mobility  Bed Mobility Supine to Sit;Sitting - Scoot to Edge of Bed  Supine to Sit 5: Supervision;HOB flat  Sitting - Scoot to [place] of Bed 5: Supervision  Details for Bed Mobility Assistance increased time required  Transfers  Transfers Sit to Stand;Stand to Sit  Sit to Stand 4: Min assist;With upper extremity assist;From bed  Stand to Sit 4: Min guard;With upper extremity assist;To chair/3-in-1;With armrests  Details for Transfer Assistance assist at R knee for sit --> stand to prevent buckling due to KI discontinued POD 2  Ambulation/Gait  Ambulation/Gait Assistance 4: Min assist  Ambulation Distance (Feet) 75 Feet  Assistive device Rolling walker  Ambulation/Gait Assistance Details no KI this date, pt with 2 episodes of R knee buckling requiring modA to maintain balance. Patient with difficulty achieving R quad set limiting ability to icnreased R LE wbing requiring increased UE wbing to prevent buckling.  Gait Pattern Step-through pattern;Decreased step length - left;Decreased stance time - right;Trunk flexed  Stairs No  Exercises  Exercises Total Joint  Total Joint Exercises  Ankle Circles/Pumps AROM;Both;10 reps  [employer];Right;10 reps (pt compensates using R hip flexor)  Long Arc Quad AAROM;Right;10 reps  Knee Flexion AAROM;Right;10 reps  Goniometric ROM  75 degrees active R knee flex in supine  PT - End of Session  Equipment Utilized During Treatment Gait belt  Activity Tolerance Patient tolerated treatment well  Patient left in chair;with call bell/phone within reach;with family/visitor present  Nurse Communication Mobility status  PT - Assessment/Plan  Comments on Treatment Session Pt motivated and progressing well. Patient remains to have decreased R quad strength and remains at increased fall risk. Pt remains unsafe to return home [place] and would benefit from SNF to achieve functional independence for safe transition home  PT Plan Discharge plan remains appropriate  PT Frequency 7X/week  Follow Up Recommendations Post acute inpatient  Does the patient have the potential to tolerate intense rehabilitation? No, Recommend SNF  Equipment Recommended Rolling walker with 5" wheels;3 in 1 bedside comode  Acute Rehab PT Goals  PT Goal: Supine/Side to Sit - Progress Progressing toward goal  PT Goal: Sit to Supine/Side - Progress Progressing toward goal  PT Goal: Sit to Stand - Progress Progressing toward goal  PT Goal: Stand to Sit - Progress Progressing toward goal  PT Goal: Ambulate - Progress Progressing toward goal  PT Goal: Perform Home Exercise Program - Progress Progressing toward goal  PT General Charges  $$ ACUTE PT VISIT 1 Procedure  PT Treatments  $Gait Training 8-22 mins  $Therapeutic Exercise 8-22 mins    Pain: 7/10 R knee pain  Lewis Shock, PT, DPT Pager #: [phone] Office #: [phone]

## 2012-01-04 NOTE — Progress Notes (Signed)
Physical Therapy Treatment Note   01/04/12 1530  PT Visit Information  Last PT Received On 01/04/12  Assistance Needed +1  PT Time Calculation  PT Start Time 1530  PT Stop Time 1554  PT Time Calculation (min) 24 min  Subjective Data  Subjective Pt received sitting up in chair with report "I've been exercising like crazy."  Precautions  Precautions Knee  Restrictions  RLE Weight Bearing WBAT  Cognition  Overall Cognitive Status Appears within functional limits for tasks assessed/performed  Arousal/Alertness Awake/alert  Orientation Level Appears intact for tasks assessed  Behavior During Session [facility] for tasks performed  Bed Mobility  Bed Mobility Sit to Supine  Sit to Supine 5: Supervision;HOB flat  Transfers  Transfers Sit to Stand;Stand to Sit  Sit to Stand 4: Min guard;With upper extremity assist;From chair/3-in-1  Stand to Sit 4: Min guard;With upper extremity assist;To chair/3-in-1;With armrests  Details for Transfer Assistance pt supervision with transfer on/off commode and hygiene post tolieting. pt able to safely stand at sink and wash hands  Ambulation/Gait  Ambulation/Gait Assistance 4: Min guard  Ambulation Distance (Feet) 150 Feet  Assistive device Rolling walker  Ambulation/Gait Assistance Details no KI  Gait Pattern Step-through pattern;Decreased step length - left;Decreased stance time - right;Trunk flexed  Gait velocity improved from AM session  General Gait Details no knee buckling this session  Total Joint Exercises  Quad Sets AROM;Right;10 reps;Supine  Long Arc Quad AROM;Right;10 reps;Seated  PT - End of Session  Equipment Utilized During Treatment Gait belt  Activity Tolerance Patient tolerated treatment well  Patient left in bed;in CPM;with family/visitor present  PT - Assessment/Plan  Comments on Treatment Session Pt demonstrated significant improvement this session. Pt con't to be motivated.   PT Plan Discharge plan remains appropriate  PT  Frequency 7X/week  Follow Up Recommendations Post acute inpatient  Does the patient have the potential to tolerate intense rehabilitation? No, Recommend SNF  Equipment Recommended Rolling walker with 5" wheels;3 in 1 bedside comode  Acute Rehab PT Goals  PT Goal: Sit to Supine/Side - Progress Progressing toward goal  PT Goal: Sit to Stand - Progress Progressing toward goal  PT Goal: Stand to Sit - Progress Progressing toward goal  PT Goal: Ambulate - Progress Progressing toward goal  PT Goal: Perform Home Exercise Program - Progress Met  PT General Charges  $$ ACUTE PT VISIT 1 Procedure  PT Treatments  $Gait Training 8-22 mins  $Therapeutic Activity 8-22 mins    Pain: pt reports "It's not to bad."  Lewis Shock, PT, DPT Pager #: [phone] Office #: [phone]

## 2012-01-04 NOTE — Progress Notes (Signed)
DC arrangements completed for transfer to [facility] tomorrow if medically stable per MD.  CSW will assist. Patient is pleased with d/c plan.  Lorri [place]. [place] Pugh  [phone]

## 2012-01-04 NOTE — Progress Notes (Signed)
    I have seen and examined the patient. I agree with the above note with the addition of : Atypical chest pain has been going on for few months. ECG is normal, CEs are normal. Echo showed normal LVSF and wall motion.  No need for an inpatient stress test. The patient can be discharged home from a cardiac standpoint. She is to follow up with Dr. Mariah Milling in [place] within a month and consider an outpatient stress test.   Lorine Bears MD, [facility] 01/04/2012 6:25 PM

## 2012-01-04 NOTE — Progress Notes (Signed)
Patient ID: Jalana Moore, female   DOB: June 14, 1940, 71 y.o.   MRN: [id number] PATIENT ID: Devita Nies  MRN: [id number]  DOB/AGE:  1940/05/01 / 71 y.o.  2 Days Post-Op Procedure(s) (LRB): TOTAL KNEE ARTHROPLASTY (Right)    PROGRESS NOTE Subjective: Patient is alert, oriented, no Nausea, no Vomiting, yes passing gas, no Bowel Movement. Taking PO well. Still complains of left sided chest pain intermittently during the night.  No Calf Pain. Using Incentive Spirometer, PAS in place. Ambulate well with physical therapy, CPM 0-60 Patient reports pain as 5 on 0-10 scale  .    Objective: Vital signs in last 24 hours: Filed Vitals:   01/03/12 2008 01/03/12 2240 01/04/12 0058 01/04/12 0637  BP: 104/57 110/59  112/62  Pulse: 65 66  60  Temp: 98.2 F (36.8 C) 97.7 F (36.5 C)  97.7 F (36.5 C)  TempSrc:      Resp: 18 18 18 18   SpO2: 97% 98% 93% 95%      Intake/Output from previous day: I/O last 3 completed shifts: In: 1440 [P.O.:1440] Out: 3325 [Urine:2950; Drains:375]   Intake/Output this shift:     LABORATORY DATA:  Basename 01/04/12 0510 01/03/12 0545  WBC 8.0 10.3  HGB 10.2* 11.5*  HCT 31.3* 34.9*  PLT 191 217  NA 139 139  K 3.8 4.1  CL 103 102  CO2 31 29  BUN 11 9  CREATININE 0.66 0.61  GLUCOSE 130* 167*  GLUCAP -- --  INR -- --  CALCIUM 9.1 --    Examination: ABD soft Neurovascular intact Sensation intact distally Intact pulses distally Dorsiflexion/Plantar flexion intact Incision: scant drainage unable to do a straight leg raises}  Assessment:   2 Days Post-Op Procedure(s) (LRB): TOTAL KNEE ARTHROPLASTY (Right) ADDITIONAL DIAGNOSIS:   Principal Problem:  *Right knee DJD Active Problems:  Depression  Migraines  Arthritis  Hyperlipidemia  Seasonal allergies  Chest pain on respiration  Bacterial UTI Ecoli  Postoperative anemia due to acute blood loss   Plan: PT/OT WBAT, CPM 5/hrs day until ROM 0-90 degrees, then D/C CPM DVT Prophylaxis:  SCDx72hrs,  ASA 325 mg BID x 2 weeks DISCHARGE PLAN: Skilled Nursing Facility/Rehab DISCHARGE NEEDS: dulcolax suppository.   will order a chest CT to rule out chest cause for pain     Praise Dolecki J 01/04/2012, 7:44 AM

## 2012-01-05 ENCOUNTER — Encounter (HOSPITAL_COMMUNITY): Payer: Self-pay | Admitting: Orthopedic Surgery

## 2012-01-05 DIAGNOSIS — K5900 Constipation, unspecified: Secondary | ICD-10-CM | POA: Diagnosis not present

## 2012-01-05 DIAGNOSIS — Z96659 Presence of unspecified artificial knee joint: Secondary | ICD-10-CM | POA: Diagnosis not present

## 2012-01-05 DIAGNOSIS — E785 Hyperlipidemia, unspecified: Secondary | ICD-10-CM | POA: Diagnosis not present

## 2012-01-05 DIAGNOSIS — S99929A Unspecified injury of unspecified foot, initial encounter: Secondary | ICD-10-CM | POA: Diagnosis not present

## 2012-01-05 DIAGNOSIS — M1710 Unilateral primary osteoarthritis, unspecified knee: Secondary | ICD-10-CM | POA: Diagnosis not present

## 2012-01-05 DIAGNOSIS — E7800 Pure hypercholesterolemia, unspecified: Secondary | ICD-10-CM | POA: Diagnosis not present

## 2012-01-05 DIAGNOSIS — S8990XA Unspecified injury of unspecified lower leg, initial encounter: Secondary | ICD-10-CM | POA: Diagnosis not present

## 2012-01-05 DIAGNOSIS — F411 Generalized anxiety disorder: Secondary | ICD-10-CM | POA: Diagnosis not present

## 2012-01-05 DIAGNOSIS — Z85828 Personal history of other malignant neoplasm of skin: Secondary | ICD-10-CM | POA: Diagnosis not present

## 2012-01-05 DIAGNOSIS — D62 Acute posthemorrhagic anemia: Secondary | ICD-10-CM | POA: Diagnosis not present

## 2012-01-05 DIAGNOSIS — F419 Anxiety disorder, unspecified: Secondary | ICD-10-CM | POA: Diagnosis present

## 2012-01-05 DIAGNOSIS — F329 Major depressive disorder, single episode, unspecified: Secondary | ICD-10-CM | POA: Diagnosis not present

## 2012-01-05 DIAGNOSIS — N390 Urinary tract infection, site not specified: Secondary | ICD-10-CM | POA: Diagnosis not present

## 2012-01-05 DIAGNOSIS — Z5189 Encounter for other specified aftercare: Secondary | ICD-10-CM | POA: Diagnosis not present

## 2012-01-05 DIAGNOSIS — M25569 Pain in unspecified knee: Secondary | ICD-10-CM | POA: Diagnosis not present

## 2012-01-05 LAB — BASIC METABOLIC PANEL
CO2: 29 mEq/L (ref 19–32)
Calcium: 8.9 mg/dL (ref 8.4–10.5)
Chloride: 106 mEq/L (ref 96–112)
Creatinine, Ser: 0.69 mg/dL (ref 0.50–1.10)
Glucose, Bld: 101 mg/dL — ABNORMAL HIGH (ref 70–99)

## 2012-01-05 LAB — CBC
Hemoglobin: 9.1 g/dL — ABNORMAL LOW (ref 12.0–15.0)
MCH: 28.5 pg (ref 26.0–34.0)
MCV: 88.7 fL (ref 78.0–100.0)
RBC: 3.19 MIL/uL — ABNORMAL LOW (ref 3.87–5.11)
WBC: 7.7 10*3/uL (ref 4.0–10.5)

## 2012-01-05 LAB — TROPONIN I: Troponin I: <0.3 ng/mL (ref <0.30)

## 2012-01-05 MED ORDER — ENOXAPARIN SODIUM 30 MG/0.3ML ~~LOC~~ SOLN: 30.0000 mg | Freq: Two times a day (BID) | SUBCUTANEOUS | Status: DC

## 2012-01-05 MED ORDER — DSS 100 MG PO CAPS: 100.0000 mg | ORAL_CAPSULE | Freq: Two times a day (BID) | ORAL | Status: DC

## 2012-01-05 MED ORDER — ATORVASTATIN CALCIUM 40 MG PO TABS: 40.0000 mg | ORAL_TABLET | Freq: Every day | ORAL | Status: DC

## 2012-01-05 MED ORDER — PANTOPRAZOLE SODIUM 40 MG PO TBEC: 40.0000 mg | DELAYED_RELEASE_TABLET | Freq: Every day | ORAL | Status: DC

## 2012-01-05 MED ORDER — BISACODYL 5 MG PO TBEC: 10.0000 mg | DELAYED_RELEASE_TABLET | Freq: Every day | ORAL | Status: DC | PRN

## 2012-01-05 MED ORDER — CELECOXIB 200 MG PO CAPS: 200.0000 mg | ORAL_CAPSULE | Freq: Every day | ORAL | Status: DC

## 2012-01-05 MED ORDER — OXYCODONE HCL 5 MG PO TABS: 5.0000 mg | ORAL_TABLET | ORAL | Status: DC | PRN

## 2012-01-05 MED ORDER — CIPROFLOXACIN HCL 500 MG PO TABS: 500.0000 mg | ORAL_TABLET | Freq: Two times a day (BID) | ORAL | Status: AC

## 2012-01-05 NOTE — Discharge Summary (Signed)
Patient ID: Kristin Kramer MRN: [id number] DOB/AGE: November 14, 1940 71 y.o.  Admit date: 01/02/2012 Discharge date: 01/05/2012  Admission Diagnoses:  Principal Problem:  *Right knee DJD Active Problems:  Depression  Migraines  Arthritis  Hyperlipidemia  Seasonal allergies  Chest pain on respiration  Bacterial UTI Ecoli  Postoperative anemia due to acute blood loss  Anxiety   Discharge Diagnoses:  Same  Past Medical History  Diagnosis Date  . Seasonal allergies     cats, dust, mold, roaches  . Depression     prior on lexapro, celexa, wellbutrin  . Migraines   . Squamous cell skin cancer   . Abnormal EKG   . Arthritis   . Right knee DJD   . Complication of anesthesia   . PONV (postoperative nausea and vomiting)   . Abnormal finding on EKG     seen by Dr. Mariah Milling, cleared by cards for surgery  . Pneumonia     hx of  . Bacterial UTI Ecoli 01/03/2012    Treatment began preoperatively with CIPRO 500mg  BID  . Refusal of blood transfusions as patient is Jehovah's Witness     Surgeries: Procedure(s): TOTAL KNEE ARTHROPLASTY on 01/02/2012   Consultants:    Discharged Condition: Improved  Hospital Course: Kristin Kramer is an 71 y.o. female who was admitted 01/02/2012 for operative treatment ofRight knee DJD. Patient has severe unremitting pain that affects sleep, daily activities, and work/hobbies. After pre-op clearance the patient was taken to the operating room on 01/02/2012 and underwent  Procedure(s): TOTAL KNEE ARTHROPLASTY.    Patient was given perioperative antibiotics: Anti-infectives     Start     Dose/Rate Route Frequency Ordered Stop   01/05/12 0000   ciprofloxacin (CIPRO) 500 MG tablet        500 mg Oral 2 times daily 01/05/12 0834 01/08/12 2359   01/03/12 0815   ciprofloxacin (CIPRO) tablet 500 mg        500 mg Oral 2 times daily 01/03/12 0812 01/08/12 0759   01/02/12 1700   ceFAZolin (ANCEF) IVPB 2 g/50 mL premix        2 g 100 mL/hr over 30 Minutes  Intravenous Every 6 hours 01/02/12 1635 01/03/12 0058   01/02/12 1325   cefUROXime (ZINACEF) injection  Status:  Discontinued          As needed 01/02/12 1325 01/02/12 1451   01/01/12 1426   ceFAZolin (ANCEF) IVPB 2 g/50 mL premix        2 g 100 mL/hr over 30 Minutes Intravenous 60 min pre-op 01/01/12 1426 01/02/12 1308           Patient was given sequential compression devices, early ambulation, and chemoprophylaxis to prevent DVT.   Post op day 0 in the middle of the night this patient  Complained of left sided chest pain that has been going on since Friday.  She told the nurse, doctor, and PA she had not mentioned it because she did not want her surgery cancelled.  Cardiac consult was order.  Physician from [facility] cardiology saw her immediately.  EKG was unchanged from preoperative evaluation by Dr Mariah Milling in [facility].  Serial Troponins were negative.  Echo was performed post op day one.  No significant abnormality.  Chest CT was done post op day 2 with no significant cause for her chest pain.  She was started on Protonix for reflux.  She was started on Lipitor for her elevated LDL.  Even today at discharge she is having  some left sided chest pain.  The cardiologist and the orthopedist feel this has been worked up extensively and is not cardiac or pulmonary in nature.  She is stable for discharge.  Patient benefited maximally from hospital stay and there were no other complications.    Recent vital signs: Patient Vitals for the past 24 hrs:  BP Temp Temp src Pulse Resp SpO2  01/05/12 0653 115/68 mmHg 97.9 F (36.6 C) - 68  18  100 %  01/28/12 2213 110/54 mmHg 97.1 F (36.2 C) - 70  18  99 %  2012/01/28 1300 106/53 mmHg 97.9 F (36.6 C) Oral 75  18  96 %     Recent laboratory studies:  Basename 01/05/12 0545 01/28/12 0510  WBC 7.7 8.0  HGB 9.1* 10.2*  HCT 28.3* 31.3*  PLT 190 191  NA 143 139  K 4.0 3.8  CL 106 103  CO2 29 31  BUN 12 11  CREATININE 0.69 0.66    GLUCOSE 101* 130*  INR -- --  CALCIUM 8.9 --     Discharge Medications:     Medication List     As of 01/05/2012  8:35 AM    STOP taking these medications         traMADol 50 MG tablet   Commonly known as: ULTRAM      TAKE these medications         acetaminophen 325 MG tablet   Commonly known as: TYLENOL   Take 650 mg by mouth every 6 (six) hours as needed. For pain      atorvastatin 40 MG tablet   Commonly known as: LIPITOR   Take 1 tablet (40 mg total) by mouth daily at 6 PM.      bisacodyl 5 MG EC tablet   Commonly known as: DULCOLAX   Take 2 tablets (10 mg total) by mouth daily as needed for constipation.      calcium carbonate 600 MG Tabs   Commonly known as: OS-CAL   Take 600 mg by mouth 2 (two) times daily with a meal.      celecoxib 200 MG capsule   Commonly known as: CELEBREX   Take 1 capsule (200 mg total) by mouth daily.      ciprofloxacin 500 MG tablet   Commonly known as: CIPRO   Take 1 tablet (500 mg total) by mouth 2 (two) times daily.      DSS 100 MG Caps   Take 100 mg by mouth 2 (two) times daily.      DULoxetine 20 MG capsule   Commonly known as: CYMBALTA   Take 20 mg by mouth daily.      enoxaparin 30 MG/0.3ML injection   Commonly known as: LOVENOX   Inject 0.3 mLs (30 mg total) into the skin every 12 (twelve) hours.      LORazepam 0.5 MG tablet   Commonly known as: ATIVAN   Take 0.25 mg by mouth at bedtime as needed. For sleep      oxyCODONE 5 MG immediate release tablet   Commonly known as: Oxy IR/ROXICODONE   Take 1-2 tablets (5-10 mg total) by mouth every 3 (three) hours as needed.      pantoprazole 40 MG tablet   Commonly known as: PROTONIX   Take 1 tablet (40 mg total) by mouth daily.      Vitamin D-3 5000 UNITS Tabs   Take 5,000 Units by mouth daily.  Diagnostic Studies: Dg Chest 2 View  12/27/2011  *RADIOLOGY REPORT*  Clinical Data: Preop for right knee arthroplasty  CHEST - 2 VIEW  Comparison: None.   Findings: Cardiomediastinal silhouette is unremarkable.  No acute infiltrate or pleural effusion.  No pulmonary edema.  Bony thorax is unremarkable.  IMPRESSION: No active disease.   Original Report Authenticated By: Natasha Mead, M.D.    Ct Chest W Contrast  01/04/2012  *RADIOLOGY REPORT*  Clinical Data: Left-sided chest pain.  Status post right knee surgery on Monday.  CT CHEST WITH CONTRAST  Technique:  Multidetector CT imaging of the chest was performed following the standard protocol during bolus administration of intravenous contrast.  Contrast: 80mL OMNIPAQUE IOHEXOL 300 MG/ML  SOLN  Comparison: Plain film of 1 day prior.  No prior CT.  Findings: Lungs/pleura: Biapical pleural thickening.  Mild linear atelectasis in the right lower lobe. Minimal left-sided major fissural thickening on image 22.  Trace bilateral pleural fluid.  Heart/Mediastinum: No central pulmonary embolism, on this non- dedicated study.  Bilateral breast implants. Normal aortic caliber without dissection.  Borderline cardiomegaly, without pericardial effusion. No mediastinal or hilar adenopathy.  Upper abdomen: Focal steatosis adjacent the falciform ligament.  Bones/Musculoskeletal:  A hemangioma within the left side of the thoracic spine.  IMPRESSION:  1. No acute process or explanation for left-sided chest pain. Please note that the exam was not performed to evaluate for pulmonary embolism.  IV access was insufficient for appropriate contrast rate.  If this is an ongoing clinical concern, consider ventilation perfusion study. 2.  Trace bilateral pleural effusions.   Original Report Authenticated By: Consuello Bossier, M.D.    Dg Chest Port 1 View  01/03/2012  *RADIOLOGY REPORT*  Clinical Data: Chest pain and shortness of breath.  PORTABLE CHEST - 1 VIEW  Comparison: PA and lateral chest 12/27/2011.  Findings: Lung volumes are lower than on the prior study with some crowding of the bronchovascular structures.  No consolidative process,  pneumothorax or effusion is identified.  No evidence of pulmonary edema is seen.  Heart size is normal.  IMPRESSION: No acute finding.   Original Report Authenticated By: Bernadene Bell. Maricela Curet, M.D.     Disposition: Final discharge disposition not confirmed      Discharge Orders    Future Appointments: Provider: Department: Dept Phone: Center:   06/13/2012 10:45 AM Antonieta Iba, MD [facility] [phone] [facility]     Future Orders Please Complete By Expires   Diet - low sodium heart healthy      Call MD / Call 911      Comments:   If you experience chest pain or shortness of breath, CALL 911 and be transported to the hospital emergency room.  If you develope a fever above 101 F, pus (white drainage) or increased drainage or redness at the wound, or calf pain, call your surgeon's office.   Constipation Prevention      Comments:   Drink plenty of fluids.  Prune juice may be helpful.  You may use a stool softener, such as Colace (over the counter) 100 mg twice a day.  Use MiraLax (over the counter) for constipation as needed.   Increase activity slowly as tolerated      Discharge instructions      Comments:   Total Knee Replacement Care After Refer to this sheet in the next few weeks. These discharge instructions provide you with general information on caring for yourself after you leave the hospital. Your caregiver may also  give you specific instructions. Your treatment has been planned according to the most current medical practices available, but unavoidable complications sometimes occur. If you have any problems or questions after discharge, please call your caregiver. Regaining a near full range of motion of your knee within the first 3 to 6 weeks after surgery is critical. HOME CARE INSTRUCTIONS  You may resume a normal diet and activities as directed.  Perform exercises as directed.  Place yellow foam block, yellow side up under heel at all times except when in CPM or  when walking.  DO NOT modify, tear, cut, or change in any way. You will receive physical therapy daily  Take showers instead of baths until informed otherwise.  Change bandages (dressings)daily Do not take over-the-counter or prescription medicines for pain, discomfort, or fever. Eat a well-balanced diet.  Avoid lifting or driving until you are instructed otherwise.  Make an appointment to see your caregiver for stitches (suture) or staple removal as directed.  If you have been sent home with a continuous passive motion machine (CPM machine), 0-90 degrees 6 hrs a day   2 hrs a shift SEEK MEDICAL CARE IF: You have swelling of your calf or leg.  You develop shortness of breath or chest pain.  You have redness, swelling, or increasing pain in the wound.  There is pus or any unusual drainage coming from the surgical site.  You notice a bad smell coming from the surgical site or dressing.  The surgical site breaks open after sutures or staples have been removed.  There is persistent bleeding from the suture or staple line.  You are getting worse or are not improving.  You have any other questions or concerns.  SEEK IMMEDIATE MEDICAL CARE IF:  You have a fever.  You develop a rash.  You have difficulty breathing.  You develop any reaction or side effects to medicines given.  Your knee motion is decreasing rather than improving.  MAKE SURE YOU:  Understand these instructions.  Will watch your condition.  Will get help right away if you are not doing well or get worse.   CPM      Comments:   Continuous passive motion machine (CPM):      Use the CPM from 0 to 90 for 6 hours per day.       You may break it up into 2 or 3 sessions per day.      Use CPM for 2 weeks or until you are told to stop.   TED hose      Scheduling Instructions:   Use stockings (TED hose) for 2 weeks on both leg(s).  You may remove them at night for sleeping.   Comments:   Use stockings (TED hose) for 2 weeks on  both leg(s).  You may remove them at night for sleeping.   Change dressing      Comments:   Change the dressing daily with sterile 4 x 4 inch gauze dressing and apply TED hose.  You may clean the incision with alcohol prior to redressing.   Do not put a pillow under the knee. Place it under the heel.      Comments:   Place yellow foam block, yellow side up under heel at all times except when in CPM or when walking.  DO NOT modify, tear, cut, or change in any way the yellow foam block.      Follow-up Information    Follow up with Nilda Simmer, MD.  On 01/17/2012. (appt time 10:45)    Contact information:   [address] [place] [address] [phone]           Signed: Pascal Lux 01/05/2012, 8:35 AM

## 2012-01-05 NOTE — Progress Notes (Signed)
Pt discharged to [facility]. Pt left unit with EMS in stable condition via stretcher. Instructions accompanied patient. Report called to nurse [facility]) at [facility].

## 2012-01-05 NOTE — Progress Notes (Signed)
Pt is feeling well. She sees Dr. Dossie Arbour in [place].   Will get an OP stress myoview with [place] in [place] once she is through with rehab. She is going to [facility] today.  [name], [name]., MD, [facility] 01/05/2012, 8:15 AM Office - [phone] Pager [phone]

## 2012-01-05 NOTE — Clinical Social Work Placement (Addendum)
    Clinical Social Work Department CLINICAL SOCIAL WORK PLACEMENT NOTE 01/05/2012  Patient:  Kristin Kramer, Kristin Kramer  Account Number:  [id number] Admit date:  01/02/2012  Clinical Social Worker:  Lupita Leash Samah Lapiana, LCSWA  Date/time:  01/03/2012 12:00 M  Clinical Social Work is seeking post-discharge placement for this patient at the following level of care:   ASSISTED LIVING/REST HOME   (*CSW will update this form in Epic as items are completed)     Patient/family provided with [facility] Department of Clinical Social Work's list of facilities offering this level of care within the geographic area requested by the patient (or if unable, by the patient's family).    Patient/family informed of their freedom to choose among providers that offer the needed level of care, that participate in Medicare, Medicaid or managed care program needed by the patient, have an available bed and are willing to accept the patient.    Patient/family informed of [facility]' ownership interest in [facility], as well as of the fact that they are under no obligation to receive care at this facility.  PASARR submitted to EDS on 01/03/2012 PASARR number received from EDS on 01/03/2012  FL2 transmitted to all facilities in geographic area requested by pt/family on  01/03/2012 FL2 transmitted to all facilities within larger geographic area on   Patient informed that his/her managed care company has contracts with or will negotiate with  certain facilities, including the following:   Pt. deferred SNF list- already chose a facility.     Patient/family informed of bed offers received: 01/03/2012   Patient chooses bed at [facility] Physician recommends and patient chooses bed at    Patient to be transferred to [facility]  on  01/05/2012 Patient to be transferred to facility by Ambulance  The following physician request were entered in Epic:   Additional Comments: Patient is pleased with d/c plan.  Notified SNF and pt's nurse- Chari Manning of d/c plan. She will call report. No further CSW needs identified.  CSW signing off.  Lorri Frederick. [place]  [phone]

## 2012-01-05 NOTE — Clinical Social Work Psychosocial (Addendum)
    Clinical Social Work Department BRIEF PSYCHOSOCIAL ASSESSMENT 01/05/2012  Patient:  [facility]     Account Number:  [id number]     Admit date:  01/02/2012  Clinical Social Worker:  Tiburcio Pea  Date/Time:  01/03/2012 03:00 PM  Referred by:  Physician  Date Referred:  01/02/2012 Referred for  SNF Placement   Other Referral:   Interview type:  Patient Other interview type:    PSYCHOSOCIAL DATA Living Status:  ALONE Admitted from facility:   Level of care:   Primary support name:  Lahoma Crocker Primary support relationship to patient:  CHILD, ADULT Degree of support available:   Strong Support    CURRENT CONCERNS Current Concerns  Post-Acute Placement   Other Concerns:    SOCIAL WORK ASSESSMENT / PLAN Met with patient to discuss need for short term SNF. Pt states that she has pre-signed with [facility] and intends to go there for short term SNF. Spoke with Donne Hazel- Admissions- [place] who confirmed same. Bed will be available for patient on 01/05/12. FL2 to be placed on chart for MD's signature.   Assessment/plan status:  Psychosocial Support/Ongoing Assessment of Needs Other assessment/ plan:   Information/referral to community resources:   SNF list deferred as patient has pre-chosen [facility]    Possible after care needs discussed with patient- to be arranged by SNF as indicated.    PATIENT'S/FAMILY'S RESPONSE TO PLAN OF CARE: Patient is alert, oriented and extremely pleasant. She is very invovled in her own discharge planning and has made her arrangements for d/c to [facility]. No further CSW needs identified.

## 2012-01-11 DIAGNOSIS — D62 Acute posthemorrhagic anemia: Secondary | ICD-10-CM | POA: Diagnosis not present

## 2012-01-11 DIAGNOSIS — F329 Major depressive disorder, single episode, unspecified: Secondary | ICD-10-CM | POA: Diagnosis not present

## 2012-01-11 DIAGNOSIS — E7800 Pure hypercholesterolemia, unspecified: Secondary | ICD-10-CM | POA: Diagnosis not present

## 2012-01-14 DIAGNOSIS — F329 Major depressive disorder, single episode, unspecified: Secondary | ICD-10-CM | POA: Diagnosis not present

## 2012-01-14 DIAGNOSIS — Z471 Aftercare following joint replacement surgery: Secondary | ICD-10-CM | POA: Diagnosis not present

## 2012-01-14 DIAGNOSIS — Z96659 Presence of unspecified artificial knee joint: Secondary | ICD-10-CM | POA: Diagnosis not present

## 2012-01-14 DIAGNOSIS — IMO0001 Reserved for inherently not codable concepts without codable children: Secondary | ICD-10-CM | POA: Diagnosis not present

## 2012-01-16 DIAGNOSIS — Z96659 Presence of unspecified artificial knee joint: Secondary | ICD-10-CM | POA: Diagnosis not present

## 2012-01-16 DIAGNOSIS — Z471 Aftercare following joint replacement surgery: Secondary | ICD-10-CM | POA: Diagnosis not present

## 2012-01-17 DIAGNOSIS — Z96659 Presence of unspecified artificial knee joint: Secondary | ICD-10-CM | POA: Diagnosis not present

## 2012-01-17 DIAGNOSIS — IMO0001 Reserved for inherently not codable concepts without codable children: Secondary | ICD-10-CM | POA: Diagnosis not present

## 2012-01-17 DIAGNOSIS — F329 Major depressive disorder, single episode, unspecified: Secondary | ICD-10-CM | POA: Diagnosis not present

## 2012-01-17 DIAGNOSIS — Z471 Aftercare following joint replacement surgery: Secondary | ICD-10-CM | POA: Diagnosis not present

## 2012-01-23 DIAGNOSIS — Z471 Aftercare following joint replacement surgery: Secondary | ICD-10-CM | POA: Diagnosis not present

## 2012-01-23 DIAGNOSIS — Z96659 Presence of unspecified artificial knee joint: Secondary | ICD-10-CM | POA: Diagnosis not present

## 2012-01-23 DIAGNOSIS — IMO0001 Reserved for inherently not codable concepts without codable children: Secondary | ICD-10-CM | POA: Diagnosis not present

## 2012-01-23 DIAGNOSIS — F329 Major depressive disorder, single episode, unspecified: Secondary | ICD-10-CM | POA: Diagnosis not present

## 2012-01-27 DIAGNOSIS — Z96659 Presence of unspecified artificial knee joint: Secondary | ICD-10-CM | POA: Diagnosis not present

## 2012-01-27 DIAGNOSIS — F329 Major depressive disorder, single episode, unspecified: Secondary | ICD-10-CM | POA: Diagnosis not present

## 2012-01-27 DIAGNOSIS — Z471 Aftercare following joint replacement surgery: Secondary | ICD-10-CM | POA: Diagnosis not present

## 2012-01-27 DIAGNOSIS — IMO0001 Reserved for inherently not codable concepts without codable children: Secondary | ICD-10-CM | POA: Diagnosis not present

## 2012-01-31 DIAGNOSIS — M1710 Unilateral primary osteoarthritis, unspecified knee: Secondary | ICD-10-CM | POA: Diagnosis not present

## 2012-01-31 DIAGNOSIS — M25669 Stiffness of unspecified knee, not elsewhere classified: Secondary | ICD-10-CM | POA: Diagnosis not present

## 2012-01-31 DIAGNOSIS — Z96659 Presence of unspecified artificial knee joint: Secondary | ICD-10-CM | POA: Diagnosis not present

## 2012-02-06 ENCOUNTER — Encounter: Payer: Self-pay | Admitting: Family Medicine

## 2012-02-06 ENCOUNTER — Ambulatory Visit (INDEPENDENT_AMBULATORY_CARE_PROVIDER_SITE_OTHER): Payer: Medicare Other | Admitting: Family Medicine

## 2012-02-06 ENCOUNTER — Telehealth: Payer: Self-pay | Admitting: Family Medicine

## 2012-02-06 VITALS — BP 124/86 | HR 88 | Temp 99.2°F | Ht 65.5 in | Wt 147.2 lb

## 2012-02-06 DIAGNOSIS — B9689 Other specified bacterial agents as the cause of diseases classified elsewhere: Secondary | ICD-10-CM

## 2012-02-06 DIAGNOSIS — A499 Bacterial infection, unspecified: Secondary | ICD-10-CM | POA: Diagnosis not present

## 2012-02-06 DIAGNOSIS — J329 Chronic sinusitis, unspecified: Secondary | ICD-10-CM

## 2012-02-06 MED ORDER — AMOXICILLIN-POT CLAVULANATE 875-125 MG PO TABS: 1.0000 | ORAL_TABLET | Freq: Two times a day (BID) | ORAL | Status: DC

## 2012-02-06 NOTE — Progress Notes (Signed)
Subjective:    Patient ID: Kristin Kramer, female    DOB: 10/21/1940, 71 y.o.   MRN: [id number]  HPI Here with uri symptoms Started getting sick on Saturday-- her family had been sick  Head congestion and sinus pain , worse above the eyes  Ears feel plugged  Throat sore Today starting coughing - a little prod - yellow phlegm to green  Temp 99.2 today  Did not think she had one at home   Did get her flu shot   Knee repl on 10/28 Got a really bad cold- was just getting over that   Patient Active Problem List  Diagnosis  . Depression  . Migraines  . Abnormal EKG  . Arthritis  . Hyperlipidemia  . Seasonal allergies  . Squamous cell skin cancer  . Right knee DJD  . Chest pain on respiration  . Bacterial UTI Ecoli  . Postoperative anemia due to acute blood loss  . Anxiety   Past Medical History  Diagnosis Date  . Seasonal allergies     cats, dust, mold, roaches  . Depression     prior on lexapro, celexa, wellbutrin  . Migraines   . Squamous cell skin cancer   . Abnormal EKG   . Arthritis   . Right knee DJD   . Complication of anesthesia   . PONV (postoperative nausea and vomiting)   . Abnormal finding on EKG     seen by Dr. Mariah Milling, cleared by cards for surgery  . Pneumonia     hx of  . Bacterial UTI Ecoli 01/03/2012    Treatment began preoperatively with CIPRO 500mg  BID  . Refusal of blood transfusions as patient is Jehovah's Witness    Past Surgical History  Procedure Date  . Tonsillectomy and adenoidectomy 1945  . Abdominal hysterectomy 1980  . Bunionectomy 2013    R and L foot  . Cataract extraction 2013    R, pending L  . Cosmetic surgery 2002    face lift  . Eye surgery     bilateral cataract surgery  . Knee arthroplasty 01/03/2012    right knee  . 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 Smoking status: Never Smoker   . Smokeless tobacco: Never Used  . Alcohol Use: Yes     Comment: occasional   Family History  Problem Relation Age of Onset  . Cancer Mother     lung, smoker  . Cancer Father     lung, smoker  . Cancer Maternal Grandmother 48    leukemia  . Coronary artery disease Paternal Grandmother 2    sudden cardiac death  . Diabetes Neg Hx   . Stroke Neg Hx    No Known Allergies Current Outpatient Prescriptions on File Prior to Visit  Medication Sig Dispense Refill  . acetaminophen (TYLENOL) 325 MG tablet Take 650 mg by mouth every 6 (six) hours as needed. For pain      . atorvastatin (LIPITOR) 40 MG tablet Take 1 tablet (40 mg total) by mouth daily at 6 PM.  30 tablet  0  . bisacodyl (DULCOLAX) 5 MG EC tablet Take 2 tablets (10 mg total) by mouth daily as needed for constipation.  30 tablet  0  . calcium carbonate (OS-CAL) 600 MG TABS Take 600 mg by  mouth 2 (two) times daily with a meal.      . Cholecalciferol (VITAMIN D-3) 5000 UNITS TABS Take 5,000 Units by mouth daily.      . DULoxetine (CYMBALTA) 20 MG capsule Take 20 mg by mouth daily.      Marland Kitchen LORazepam (ATIVAN) 0.5 MG tablet Take 0.25 mg by mouth at bedtime as needed. For sleep      . oxyCODONE (OXY IR/ROXICODONE) 5 MG immediate release tablet Take 1-2 tablets (5-10 mg total) by mouth every 3 (three) hours as needed.  100 tablet  0  . pantoprazole (PROTONIX) 40 MG tablet Take 1 tablet (40 mg total) by mouth daily.  30 tablet  0  . enoxaparin (LOVENOX) 30 MG/0.3ML injection Inject 0.3 mLs (30 mg total) into the skin every 12 (twelve) hours.  20 Syringe  0      Review of Systems Review of Systems  Constitutional: Negative for  appetite change,  and unexpected weight change.  Eyes: Negative for pain and visual disturbance.  ENt pos for cong/ purulent nasal drainage and sinus pain  Respiratory: Negative for wheeze and shortness of breath.   Cardiovascular: Negative  for cp or palpitations    Gastrointestinal: Negative for nausea, diarrhea and constipation.  Genitourinary: Negative for urgency and frequency.  Skin: Negative for pallor or rash   Neurological: Negative for weakness, light-headedness, numbness and headaches.  Hematological: Negative for adenopathy. Does not bruise/bleed easily.  Psychiatric/Behavioral: Negative for dysphoric mood. The patient is not nervous/anxious.         Objective:   Physical Exam  Constitutional: She appears well-developed and well-nourished. No distress.  HENT:  Head: Normocephalic and atraumatic.  Right Ear: External ear normal.  Left Ear: External ear normal.  Mouth/Throat: Oropharynx is clear and moist. No oropharyngeal exudate.       Nares are injected and congested    Eyes: Conjunctivae normal and EOM are normal. Pupils are equal, round, and reactive to light. Right eye exhibits no discharge. Left eye exhibits no discharge.  Neck: Normal range of motion. Neck supple.  Cardiovascular: Normal rate and regular rhythm.   Pulmonary/Chest: Effort normal and breath sounds normal. No respiratory distress. She has no wheezes. She exhibits no tenderness.  Lymphadenopathy:    She has no cervical adenopathy.  Neurological: She is alert. No cranial nerve deficit.  Skin: Skin is warm and dry. No rash noted.  Psychiatric: She has a normal mood and affect.          Assessment & Plan:

## 2012-02-06 NOTE — Assessment & Plan Note (Signed)
S/p uri , and with another uri also  tx with augmentin  Disc symptomatic care - see instructions on AVS  Update if not starting to improve in a week or if worsening

## 2012-02-06 NOTE — Patient Instructions (Addendum)
Get lots of rest and fluids  Warm compresses on face help sinus pain  Nasal saline spray mucinex DM for cough and congestion Take augmentin for sinus infection  Update if not starting to improve in a week or if worsening

## 2012-02-06 NOTE — Telephone Encounter (Signed)
She was seen  

## 2012-02-06 NOTE — Telephone Encounter (Signed)
Patient Information:  Caller Name: Devynn  Phone: [phone]  Patient: Kristin Kramer, Kristin Kramer  Gender: Female  DOB: 01/29/1941  Age: 71 Years  PCP: Eustaquio Boyden [facility])   Symptoms  Reason For Call & Symptoms: Onset of cold on 01/18/12; but was able to control and improve; but has returned with symptoms worsening on 02/02/12  Reviewed Health History In EMR: Yes  Reviewed Medications In EMR: Yes  Reviewed Allergies In EMR: Yes  Reviewed Surgeries / Procedures: Yes  Date of Onset of Symptoms: 01/18/2012  Treatments Tried: Tylenol Sinus and Vitamin C  Treatments Tried Worked: Yes  Guideline(s) Used:  Sinus Pain and Congestion  Disposition Per Guideline:   Go to Office Now  Reason For Disposition Reached:   Severe sinus pain  Advice Given:  Hydration:  Drink plenty of liquids (6-8 glasses of water daily). If the air in your home is dry, use a cool mist humidifier  Office Follow Up:  Does the office need to follow up with this patient?: No  Instructions For The Office: N/A  Appointment Scheduled:  02/06/2012 15:00:00 Appointment Scheduled Provider:  Roxy Manns [facility])  RN Note:  Complaining of sore throat, bilateral ear pain (4/10); Headache behind sinuses (10/10) pressure and cough; Nasal Drainage-green; Coughing up green sputum; Afebrile; Appetite is poor, drinking poorly; Is urinating regularly; Appointment scheduled Please note knee surgery done in October, 2013

## 2012-02-13 DIAGNOSIS — M25669 Stiffness of unspecified knee, not elsewhere classified: Secondary | ICD-10-CM | POA: Diagnosis not present

## 2012-02-13 DIAGNOSIS — M1710 Unilateral primary osteoarthritis, unspecified knee: Secondary | ICD-10-CM | POA: Diagnosis not present

## 2012-02-16 DIAGNOSIS — M25669 Stiffness of unspecified knee, not elsewhere classified: Secondary | ICD-10-CM | POA: Diagnosis not present

## 2012-02-16 DIAGNOSIS — M1710 Unilateral primary osteoarthritis, unspecified knee: Secondary | ICD-10-CM | POA: Diagnosis not present

## 2012-02-20 DIAGNOSIS — M1710 Unilateral primary osteoarthritis, unspecified knee: Secondary | ICD-10-CM | POA: Diagnosis not present

## 2012-02-20 DIAGNOSIS — M25669 Stiffness of unspecified knee, not elsewhere classified: Secondary | ICD-10-CM | POA: Diagnosis not present

## 2012-02-23 DIAGNOSIS — L821 Other seborrheic keratosis: Secondary | ICD-10-CM | POA: Diagnosis not present

## 2012-02-23 DIAGNOSIS — Z85828 Personal history of other malignant neoplasm of skin: Secondary | ICD-10-CM | POA: Diagnosis not present

## 2012-02-26 ENCOUNTER — Encounter: Payer: Self-pay | Admitting: Family Medicine

## 2012-02-26 DIAGNOSIS — E785 Hyperlipidemia, unspecified: Secondary | ICD-10-CM | POA: Insufficient documentation

## 2012-02-26 DIAGNOSIS — M8580 Other specified disorders of bone density and structure, unspecified site: Secondary | ICD-10-CM | POA: Insufficient documentation

## 2012-02-26 DIAGNOSIS — E559 Vitamin D deficiency, unspecified: Secondary | ICD-10-CM | POA: Insufficient documentation

## 2012-02-28 ENCOUNTER — Encounter: Payer: Self-pay | Admitting: Family Medicine

## 2012-02-28 DIAGNOSIS — M1710 Unilateral primary osteoarthritis, unspecified knee: Secondary | ICD-10-CM | POA: Diagnosis not present

## 2012-02-28 DIAGNOSIS — Z96659 Presence of unspecified artificial knee joint: Secondary | ICD-10-CM | POA: Diagnosis not present

## 2012-03-01 DIAGNOSIS — M1710 Unilateral primary osteoarthritis, unspecified knee: Secondary | ICD-10-CM | POA: Diagnosis not present

## 2012-03-01 DIAGNOSIS — Z96659 Presence of unspecified artificial knee joint: Secondary | ICD-10-CM | POA: Diagnosis not present

## 2012-03-07 DIAGNOSIS — I880 Nonspecific mesenteric lymphadenitis: Secondary | ICD-10-CM

## 2012-03-07 HISTORY — DX: Nonspecific mesenteric lymphadenitis: I88.0

## 2012-03-08 DIAGNOSIS — M1710 Unilateral primary osteoarthritis, unspecified knee: Secondary | ICD-10-CM | POA: Diagnosis not present

## 2012-03-08 DIAGNOSIS — M25669 Stiffness of unspecified knee, not elsewhere classified: Secondary | ICD-10-CM | POA: Diagnosis not present

## 2012-03-13 DIAGNOSIS — M1710 Unilateral primary osteoarthritis, unspecified knee: Secondary | ICD-10-CM | POA: Diagnosis not present

## 2012-03-13 DIAGNOSIS — M2560 Stiffness of unspecified joint, not elsewhere classified: Secondary | ICD-10-CM | POA: Diagnosis not present

## 2012-03-15 DIAGNOSIS — M1710 Unilateral primary osteoarthritis, unspecified knee: Secondary | ICD-10-CM | POA: Diagnosis not present

## 2012-03-15 DIAGNOSIS — M25669 Stiffness of unspecified knee, not elsewhere classified: Secondary | ICD-10-CM | POA: Diagnosis not present

## 2012-03-15 DIAGNOSIS — Z96659 Presence of unspecified artificial knee joint: Secondary | ICD-10-CM | POA: Diagnosis not present

## 2012-03-20 DIAGNOSIS — M2560 Stiffness of unspecified joint, not elsewhere classified: Secondary | ICD-10-CM | POA: Diagnosis not present

## 2012-03-20 DIAGNOSIS — M1710 Unilateral primary osteoarthritis, unspecified knee: Secondary | ICD-10-CM | POA: Diagnosis not present

## 2012-03-23 DIAGNOSIS — M779 Enthesopathy, unspecified: Secondary | ICD-10-CM | POA: Diagnosis not present

## 2012-06-05 DIAGNOSIS — H1045 Other chronic allergic conjunctivitis: Secondary | ICD-10-CM | POA: Diagnosis not present

## 2012-06-05 DIAGNOSIS — J3089 Other allergic rhinitis: Secondary | ICD-10-CM | POA: Diagnosis not present

## 2012-06-13 ENCOUNTER — Ambulatory Visit (INDEPENDENT_AMBULATORY_CARE_PROVIDER_SITE_OTHER): Payer: Medicare Other | Admitting: Cardiovascular Disease

## 2012-06-13 ENCOUNTER — Encounter: Payer: Self-pay | Admitting: Cardiovascular Disease

## 2012-06-13 VITALS — BP 130/86 | HR 71 | Ht 66.0 in | Wt 151.0 lb

## 2012-06-13 DIAGNOSIS — R079 Chest pain, unspecified: Secondary | ICD-10-CM

## 2012-06-13 DIAGNOSIS — E785 Hyperlipidemia, unspecified: Secondary | ICD-10-CM | POA: Diagnosis not present

## 2012-06-13 DIAGNOSIS — R9431 Abnormal electrocardiogram [ECG] [EKG]: Secondary | ICD-10-CM

## 2012-06-13 MED ORDER — ATORVASTATIN CALCIUM 10 MG PO TABS: 10.0000 mg | ORAL_TABLET | Freq: Every day | ORAL | Status: DC

## 2012-06-13 NOTE — Assessment & Plan Note (Addendum)
Prior episodes of chest pain in recovery after her total knee replacement. No further episodes since that time. Normal echocardiogram. If she has additional episodes of chest pain, we would order a stress test.

## 2012-06-13 NOTE — Progress Notes (Signed)
Patient ID: Kristin Kramer, female    DOB: [birth year], 72 y.o.   MRN: [id number]  HPI Comments: [name] is a very pleasant 72 year old woman with a history of hyperlipidemia, prior foot surgery, osteoarthritis of her knees, total knee replacement at [facility] on 01/02/2012, previously presenting for abnormal EKG and preop eval who presents for routine followup.  She tolerated her right total knee replacement last year without any significant difficulty. She does report him postop having chest pain. She had echocardiogram which was normal, cardiac enzymes were normal. No stress test was performed. He does not appear that cardiology was consulted. Since then she's not had any further episodes of chest pain. Rehabilitation was difficult but now she feels well and is happy with the outcome of her right knee surgery.   No significant lower extremity edema, no shortness of breath with exertion. She is taking 4 tabs of red yeast rice daily.  She reports that her father passed away at age 47 from lung cancer, mother at age 21 for lung cancer. They were both smokers  EKG today shows normal sinus rhythm with rate 71 beats per minute, incomplete right bundle branch block, left axis deviation/left anterior fascicular block. No significant change from prior EKG.   Outpatient Encounter Prescriptions as of 06/13/2012  Medication Sig Dispense Refill  . acetaminophen (TYLENOL) 325 MG tablet Take 650 mg by mouth every 6 (six) hours as needed. For pain      . BEPREVE 1.5 % SOLN 1 drop 2 (two) times daily.       . Biotin 1000 MCG tablet Take 2,000 mcg by mouth daily.      . calcium carbonate (OS-CAL) 600 MG TABS Take 600 mg by mouth 2 (two) times daily with a meal.      . cetirizine (ZYRTEC) 10 MG tablet Take 10 mg by mouth daily.      . Cholecalciferol (VITAMIN D-3) 5000 UNITS TABS Take 5,000 Units by mouth daily.      . DULoxetine (CYMBALTA) 20 MG capsule Take 20 mg by mouth daily.      Marland Kitchen LORazepam (ATIVAN)  0.5 MG tablet Take 0.25 mg by mouth at bedtime as needed. For sleep      . mometasone (NASONEX) 50 MCG/ACT nasal spray Place 2 sprays into the nose daily.      . Omega-3 Fatty Acids (FISH OIL) 1200 MG CAPS Take by mouth daily.      . Probiotic Product (PROBIOTIC PO) Take by mouth daily.      . Red Yeast Rice Extract (RED YEAST RICE PO) Take 600 mg by mouth. Takes 2 tablets am 2 tablets pm daily.      . Turmeric 500 MG CAPS Take by mouth daily.      Marland Kitchen atorvastatin (LIPITOR) 10 MG tablet Take 1 tablet (10 mg total) by mouth daily.  30 tablet  6  . [DISCONTINUED] amoxicillin-clavulanate (AUGMENTIN) 875-125 MG per tablet Take 1 tablet by mouth 2 (two) times daily.  14 tablet  0  . [DISCONTINUED] atorvastatin (LIPITOR) 40 MG tablet Take 1 tablet (40 mg total) by mouth daily at 6 PM.  30 tablet  0  . [DISCONTINUED] bisacodyl (DULCOLAX) 5 MG EC tablet Take 2 tablets (10 mg total) by mouth daily as needed for constipation.  30 tablet  0  . [DISCONTINUED] enoxaparin (LOVENOX) 30 MG/0.3ML injection Inject 0.3 mLs (30 mg total) into the skin every 12 (twelve) hours.  20 Syringe  0  . [DISCONTINUED]  oxyCODONE (OXY IR/ROXICODONE) 5 MG immediate release tablet Take 1-2 tablets (5-10 mg total) by mouth every 3 (three) hours as needed.  100 tablet  0  . [DISCONTINUED] pantoprazole (PROTONIX) 40 MG tablet Take 1 tablet (40 mg total) by mouth daily.  30 tablet  0   No facility-administered encounter medications on file as of 06/13/2012.     Review of Systems  Constitutional: Negative.   HENT: Negative.   Eyes: Negative.   Respiratory: Negative.   Cardiovascular: Negative.   Gastrointestinal: Negative.   Musculoskeletal: Positive for joint swelling and arthralgias.  Skin: Negative.   Neurological: Negative.   Psychiatric/Behavioral: Negative.   All other systems reviewed and are negative.    BP 130/86  Pulse 71  Ht 5\' 6"  (1.676 m)  Wt 151 lb (68.493 kg)  BMI 24.38 kg/m2  Physical Exam  Nursing  note and vitals reviewed. Constitutional: She is oriented to person, place, and time. She appears well-developed and well-nourished.  HENT:  Head: Normocephalic.  Nose: Nose normal.  Mouth/Throat: Oropharynx is clear and moist.  Eyes: Conjunctivae are normal. Pupils are equal, round, and reactive to light.  Neck: Normal range of motion. Neck supple. No JVD present.  Cardiovascular: Normal rate, regular rhythm, S1 normal, S2 normal, normal heart sounds and intact distal pulses.  Exam reveals no gallop and no friction rub.   No murmur heard. Pulmonary/Chest: Effort normal and breath sounds normal. No respiratory distress. She has no wheezes. She has no rales. She exhibits no tenderness.  Abdominal: Soft. Bowel sounds are normal. She exhibits no distension. There is no tenderness.  Musculoskeletal: Normal range of motion. She exhibits no edema and no tenderness.  Lymphadenopathy:    She has no cervical adenopathy.  Neurological: She is alert and oriented to person, place, and time. Coordination normal.  Skin: Skin is warm and dry. No rash noted. No erythema.  Psychiatric: She has a normal mood and affect. Her behavior is normal. Judgment and thought content normal.    Assessment and Plan

## 2012-06-13 NOTE — Assessment & Plan Note (Signed)
Nonspecific changes on EKG. No recent change. No further workup at this time as she is symptom-free

## 2012-06-13 NOTE — Assessment & Plan Note (Signed)
Tolerating red yeast rice. She is interested in trying a low-dose generic statin. We will start generic Lipitor 10 mg daily. Cholesterol checked in several months time.

## 2012-06-13 NOTE — Patient Instructions (Addendum)
You are doing well. Please start generic lipitor one a day (atorvastatin)  Please call us if you have new issues that need to be addressed before your next appt.  Your physician wants you to follow-up in: 12 months.  You will receive a reminder letter in the mail two months in advance. If you don't receive a letter, please call our office to schedule the follow-up appointment.

## 2012-07-13 ENCOUNTER — Encounter: Payer: Self-pay | Admitting: Family Medicine

## 2012-07-13 ENCOUNTER — Ambulatory Visit (INDEPENDENT_AMBULATORY_CARE_PROVIDER_SITE_OTHER): Payer: Medicare Other | Admitting: Family Medicine

## 2012-07-13 VITALS — BP 112/78 | HR 76 | Temp 98.4°F | Wt 151.0 lb

## 2012-07-13 DIAGNOSIS — F32A Depression, unspecified: Secondary | ICD-10-CM

## 2012-07-13 DIAGNOSIS — F3289 Other specified depressive episodes: Secondary | ICD-10-CM

## 2012-07-13 DIAGNOSIS — E785 Hyperlipidemia, unspecified: Secondary | ICD-10-CM | POA: Diagnosis not present

## 2012-07-13 DIAGNOSIS — F329 Major depressive disorder, single episode, unspecified: Secondary | ICD-10-CM

## 2012-07-13 DIAGNOSIS — D62 Acute posthemorrhagic anemia: Secondary | ICD-10-CM | POA: Diagnosis not present

## 2012-07-13 NOTE — Patient Instructions (Signed)
Return in 1 month for lab visit to check cholesterol levels. Return in 4-6 months for medicare wellness visit. Good to see you today, call us with questions.

## 2012-07-13 NOTE — Assessment & Plan Note (Signed)
lipitor 10mg  caused myalgias. Has restarted RYR - will return in 1 mo to recheck FLP, will determine plan based on results. Discussed less potent statin + coQ10 vs other chol med.

## 2012-07-13 NOTE — Assessment & Plan Note (Signed)
Recheck to ensure resolved - once returns for blood work.

## 2012-07-13 NOTE — Progress Notes (Signed)
  Subjective:    Patient ID: Kristin Kramer, female    DOB: [birth year], 72 y.o.   MRN: [id number]  HPI CC: 6 mo f/u  Presents today as f/u.  Doing well after R knee replacement.  Depression - prior on lexapro/wellbutrin but this caused tremors and muscle twitches.  This was changed to cymbalta.  Also takes ativan as needed.  Feels mood overall stable.  H/o daughter passing from MVA.  Does better with brand name cymbalta.  HLD - Tried lipitor - was on lipitor for 3 weeks.  Caused bad leg cramping and fatigue.  Has been off lipitor for the last week.  Takes 2 tablets twice daily of red yeast rice 600mg  daily.  Last CPE - thinks 06/2012.  Past Medical History  Diagnosis Date  . Seasonal allergies     cats, dust, mold, roaches  . Depression     prior on lexapro, celexa, wellbutrin.  high cymbalta doses cause tremors  . Migraines   . Squamous cell skin cancer   . Arthritis   . Right knee DJD   . Complication of anesthesia     postop n/v  . Abnormal finding on EKG     seen by Dr. Mariah Milling, cleared by cards for surgery  . History of pneumonia   . Bacterial UTI Ecoli 01/03/2012    Treatment began preoperatively with CIPRO 500mg  BID  . Refusal of blood transfusions as patient is Jehovah's Witness   . Osteopenia   . HLD (hyperlipidemia)   . Vitamin D deficiency      Review of Systems Per HPI    Objective:   Physical Exam  Nursing note and vitals reviewed. Constitutional: She appears well-developed and well-nourished. No distress.  HENT:  Head: Normocephalic and atraumatic.  Mouth/Throat: Oropharynx is clear and moist. No oropharyngeal exudate.  Neck: Normal range of motion. Neck supple. Carotid bruit is not present.  Cardiovascular: Normal rate, regular rhythm, normal heart sounds and intact distal pulses.   No murmur heard. Pulmonary/Chest: Effort normal and breath sounds normal. No respiratory distress. She has no wheezes. She has no rales.  Musculoskeletal: She exhibits no  edema.  Lymphadenopathy:    She has no cervical adenopathy.       Assessment & Plan:

## 2012-07-13 NOTE — Assessment & Plan Note (Signed)
Discussed cymbalta brand vs generic - pt thinks may have been due to lipitor actually -and would like to retry generic, will let me know if desires brand only at future visit. On low dose cymbalta, feels doing well from mood perspective.

## 2012-07-23 DIAGNOSIS — Z961 Presence of intraocular lens: Secondary | ICD-10-CM | POA: Diagnosis not present

## 2012-07-23 DIAGNOSIS — H2510 Age-related nuclear cataract, unspecified eye: Secondary | ICD-10-CM | POA: Diagnosis not present

## 2012-08-13 ENCOUNTER — Other Ambulatory Visit: Payer: Self-pay | Admitting: Family Medicine

## 2012-08-13 ENCOUNTER — Other Ambulatory Visit (INDEPENDENT_AMBULATORY_CARE_PROVIDER_SITE_OTHER): Payer: Medicare Other

## 2012-08-13 DIAGNOSIS — E785 Hyperlipidemia, unspecified: Secondary | ICD-10-CM | POA: Diagnosis not present

## 2012-08-13 DIAGNOSIS — E559 Vitamin D deficiency, unspecified: Secondary | ICD-10-CM

## 2012-08-13 DIAGNOSIS — D62 Acute posthemorrhagic anemia: Secondary | ICD-10-CM

## 2012-08-13 DIAGNOSIS — M949 Disorder of cartilage, unspecified: Secondary | ICD-10-CM | POA: Diagnosis not present

## 2012-08-13 DIAGNOSIS — M8580 Other specified disorders of bone density and structure, unspecified site: Secondary | ICD-10-CM

## 2012-08-13 DIAGNOSIS — M899 Disorder of bone, unspecified: Secondary | ICD-10-CM | POA: Diagnosis not present

## 2012-08-13 LAB — LIPID PANEL
Cholesterol: 235 mg/dL — ABNORMAL HIGH (ref 0–200)
HDL: 57.5 mg/dL
Total CHOL/HDL Ratio: 4
Triglycerides: 129 mg/dL (ref 0.0–149.0)
VLDL: 25.8 mg/dL (ref 0.0–40.0)

## 2012-08-13 LAB — BASIC METABOLIC PANEL
BUN: 15 mg/dL (ref 6–23)
Calcium: 9.4 mg/dL (ref 8.4–10.5)
Chloride: 105 mEq/L (ref 96–112)
Creatinine, Ser: 0.7 mg/dL (ref 0.4–1.2)

## 2012-08-13 LAB — CBC WITH DIFFERENTIAL/PLATELET
Basophils Absolute: 0.1 10*3/uL (ref 0.0–0.1)
Eosinophils Relative: 7.3 % — ABNORMAL HIGH (ref 0.0–5.0)
Lymphocytes Relative: 29.2 % (ref 12.0–46.0)
Lymphs Abs: 1.5 10*3/uL (ref 0.7–4.0)
Monocytes Relative: 10.4 % (ref 3.0–12.0)
Neutrophils Relative %: 51.8 % (ref 43.0–77.0)
Platelets: 260 10*3/uL (ref 150.0–400.0)
RDW: 14.1 % (ref 11.5–14.6)
WBC: 5.1 10*3/uL (ref 4.5–10.5)

## 2012-08-13 LAB — LDL CHOLESTEROL, DIRECT: Direct LDL: 146.4 mg/dL

## 2012-08-13 MED ORDER — PRAVASTATIN SODIUM 40 MG PO TABS: 40.0000 mg | ORAL_TABLET | Freq: Every day | ORAL | Status: DC

## 2012-08-17 ENCOUNTER — Telehealth: Payer: Self-pay

## 2012-08-17 NOTE — Telephone Encounter (Signed)
Pt request access code for mychart; after 3 pt identifiers gave pt [id number].

## 2012-09-26 DIAGNOSIS — C44621 Squamous cell carcinoma of skin of unspecified upper limb, including shoulder: Secondary | ICD-10-CM | POA: Diagnosis not present

## 2012-09-26 DIAGNOSIS — C44320 Squamous cell carcinoma of skin of unspecified parts of face: Secondary | ICD-10-CM | POA: Diagnosis not present

## 2012-09-26 DIAGNOSIS — L570 Actinic keratosis: Secondary | ICD-10-CM | POA: Diagnosis not present

## 2012-09-26 DIAGNOSIS — Z85828 Personal history of other malignant neoplasm of skin: Secondary | ICD-10-CM | POA: Diagnosis not present

## 2012-09-26 DIAGNOSIS — D485 Neoplasm of uncertain behavior of skin: Secondary | ICD-10-CM | POA: Diagnosis not present

## 2012-10-04 DIAGNOSIS — L719 Rosacea, unspecified: Secondary | ICD-10-CM | POA: Diagnosis not present

## 2012-10-04 DIAGNOSIS — D1801 Hemangioma of skin and subcutaneous tissue: Secondary | ICD-10-CM | POA: Diagnosis not present

## 2012-10-04 DIAGNOSIS — D239 Other benign neoplasm of skin, unspecified: Secondary | ICD-10-CM | POA: Diagnosis not present

## 2012-10-04 DIAGNOSIS — Z85828 Personal history of other malignant neoplasm of skin: Secondary | ICD-10-CM | POA: Diagnosis not present

## 2012-10-04 DIAGNOSIS — D485 Neoplasm of uncertain behavior of skin: Secondary | ICD-10-CM | POA: Diagnosis not present

## 2012-10-04 DIAGNOSIS — L819 Disorder of pigmentation, unspecified: Secondary | ICD-10-CM | POA: Diagnosis not present

## 2012-10-04 DIAGNOSIS — L821 Other seborrheic keratosis: Secondary | ICD-10-CM | POA: Diagnosis not present

## 2012-10-04 DIAGNOSIS — L851 Acquired keratosis [keratoderma] palmaris et plantaris: Secondary | ICD-10-CM | POA: Diagnosis not present

## 2012-10-23 ENCOUNTER — Other Ambulatory Visit: Payer: Self-pay | Admitting: *Deleted

## 2012-10-23 MED ORDER — PRAVASTATIN SODIUM 40 MG PO TABS: 40.0000 mg | ORAL_TABLET | Freq: Every day | ORAL | Status: DC

## 2012-10-24 DIAGNOSIS — M1710 Unilateral primary osteoarthritis, unspecified knee: Secondary | ICD-10-CM | POA: Diagnosis not present

## 2012-12-05 DIAGNOSIS — R109 Unspecified abdominal pain: Secondary | ICD-10-CM

## 2012-12-05 HISTORY — DX: Unspecified abdominal pain: R10.9

## 2012-12-27 DIAGNOSIS — J3089 Other allergic rhinitis: Secondary | ICD-10-CM | POA: Diagnosis not present

## 2012-12-27 DIAGNOSIS — H1045 Other chronic allergic conjunctivitis: Secondary | ICD-10-CM | POA: Diagnosis not present

## 2012-12-31 ENCOUNTER — Emergency Department: Payer: Self-pay | Admitting: Emergency Medicine

## 2012-12-31 DIAGNOSIS — Z96659 Presence of unspecified artificial knee joint: Secondary | ICD-10-CM | POA: Diagnosis not present

## 2012-12-31 DIAGNOSIS — Z79899 Other long term (current) drug therapy: Secondary | ICD-10-CM | POA: Diagnosis not present

## 2012-12-31 DIAGNOSIS — R109 Unspecified abdominal pain: Secondary | ICD-10-CM | POA: Diagnosis not present

## 2012-12-31 DIAGNOSIS — R079 Chest pain, unspecified: Secondary | ICD-10-CM | POA: Diagnosis not present

## 2012-12-31 DIAGNOSIS — I880 Nonspecific mesenteric lymphadenitis: Secondary | ICD-10-CM | POA: Diagnosis not present

## 2012-12-31 LAB — COMPREHENSIVE METABOLIC PANEL
Albumin: 4 g/dL (ref 3.4–5.0)
BUN: 21 mg/dL — ABNORMAL HIGH (ref 7–18)
Bilirubin,Total: 0.3 mg/dL (ref 0.2–1.0)
Chloride: 107 mmol/L (ref 98–107)
Co2: 30 mmol/L (ref 21–32)
Creatinine: 0.95 mg/dL (ref 0.60–1.30)
Glucose: 96 mg/dL (ref 65–99)
Osmolality: 284 (ref 275–301)
Potassium: 4.2 mmol/L (ref 3.5–5.1)
SGOT(AST): 103 U/L — ABNORMAL HIGH (ref 15–37)
Total Protein: 7.6 g/dL (ref 6.4–8.2)

## 2012-12-31 LAB — CBC
HCT: 41.1 % (ref 35.0–47.0)
HGB: 13.9 g/dL (ref 12.0–16.0)
MCH: 29.8 pg (ref 26.0–34.0)
Platelet: 280 10*3/uL (ref 150–440)
RBC: 4.67 10*6/uL (ref 3.80–5.20)

## 2012-12-31 LAB — URINALYSIS, COMPLETE
Bacteria: NONE SEEN
Blood: NEGATIVE
Nitrite: NEGATIVE
Ph: 6 (ref 4.5–8.0)
Specific Gravity: 1.023 (ref 1.003–1.030)
Squamous Epithelial: <1

## 2013-01-02 DIAGNOSIS — M1710 Unilateral primary osteoarthritis, unspecified knee: Secondary | ICD-10-CM | POA: Diagnosis not present

## 2013-01-03 ENCOUNTER — Encounter: Payer: Self-pay | Admitting: Family Medicine

## 2013-01-03 ENCOUNTER — Ambulatory Visit (INDEPENDENT_AMBULATORY_CARE_PROVIDER_SITE_OTHER): Payer: Medicare Other | Admitting: Family Medicine

## 2013-01-03 VITALS — BP 126/84 | HR 76 | Temp 98.7°F | Wt 152.8 lb

## 2013-01-03 DIAGNOSIS — K654 Sclerosing mesenteritis: Secondary | ICD-10-CM | POA: Insufficient documentation

## 2013-01-03 DIAGNOSIS — R109 Unspecified abdominal pain: Secondary | ICD-10-CM | POA: Diagnosis not present

## 2013-01-03 DIAGNOSIS — R7401 Elevation of levels of liver transaminase levels: Secondary | ICD-10-CM

## 2013-01-03 DIAGNOSIS — N898 Other specified noninflammatory disorders of vagina: Secondary | ICD-10-CM

## 2013-01-03 DIAGNOSIS — Z1231 Encounter for screening mammogram for malignant neoplasm of breast: Secondary | ICD-10-CM

## 2013-01-03 DIAGNOSIS — R748 Abnormal levels of other serum enzymes: Secondary | ICD-10-CM | POA: Insufficient documentation

## 2013-01-03 HISTORY — DX: Abnormal levels of other serum enzymes: R74.8

## 2013-01-03 HISTORY — DX: Sclerosing mesenteritis: K65.4

## 2013-01-03 LAB — POCT URINALYSIS DIPSTICK
Glucose, UA: NEGATIVE
Nitrite, UA: NEGATIVE
Spec Grav, UA: <=1.005
Urobilinogen, UA: 0.2

## 2013-01-03 LAB — COMPREHENSIVE METABOLIC PANEL
ALT: 42 U/L — ABNORMAL HIGH (ref 0–35)
AST: 33 U/L (ref 0–37)
CO2: 32 mEq/L (ref 19–32)
Calcium: 9.8 mg/dL (ref 8.4–10.5)
Chloride: 100 mEq/L (ref 96–112)
Creatinine, Ser: 0.8 mg/dL (ref 0.4–1.2)
GFR: 79.53 mL/min (ref >60.00)
Sodium: 139 mEq/L (ref 135–145)
Total Protein: 7.6 g/dL (ref 6.0–8.3)

## 2013-01-03 LAB — CBC WITH DIFFERENTIAL/PLATELET
Basophils Absolute: 0 10*3/uL (ref 0.0–0.1)
Basophils Relative: 0.7 % (ref 0.0–3.0)
Eosinophils Absolute: 0.2 10*3/uL (ref 0.0–0.7)
Hemoglobin: 13.5 g/dL (ref 12.0–15.0)
MCHC: 33.4 g/dL (ref 30.0–36.0)
MCV: 87.4 fl (ref 78.0–100.0)
Monocytes Absolute: 0.6 10*3/uL (ref 0.1–1.0)
Neutro Abs: 4.1 10*3/uL (ref 1.4–7.7)
Neutrophils Relative %: 66.6 % (ref 43.0–77.0)
RBC: 4.62 Mil/uL (ref 3.87–5.11)
RDW: 13.5 % (ref 11.5–14.6)

## 2013-01-03 NOTE — Progress Notes (Signed)
Subjective:    Patient ID: Kristin Kramer, female    DOB: [birth year], 72 y.o.   MRN: [id number]  HPI CC: ER f/u  Last Friday started having twinges of pain in sides and back.  Progressively worsening over weekend, with radiation on right side to neck and shoulder, led her to ER evaluation on 12/30/2012.  Given shot of morphine which helped.  Had contrasted CT scan - told had "mesenteric adenitis".  Treated with tramadol and advised to f/u with PCP.  At its worse, 10/10 pain, currently persistent 5/10 pain.  Describes pain as twisting gnawing.  This past week continuing to have abdominal pain.  Now having R>L LQ and R flank pain, worse with deep breath.   Pt thinks urine was checked.  No appetite. No fevers/chills, chest pain, chest tightness, coughing or wheezing.  No nausea/vomiting, diarrhea, constipation, blood in stool or urine.  Last BM was 3d ago but passing gas well. No recent viral infections.  Has had several year history of vaginal discharge, states prior evaluated and unrevealing.  Will need to review records from [facility].  Sxs getting better - today especially.  No recent mammogram - would like to set up today. UTD colonoscopy (done 2007) but no records available.  Have requested today. S/p hysterectomy  Past Medical History  Diagnosis Date  . Seasonal allergies     cats, dust, mold, roaches  . Depression     prior on lexapro, celexa, wellbutrin.  high cymbalta doses cause tremors  . Migraines   . Squamous cell skin cancer   . Arthritis   . Right knee DJD   . Complication of anesthesia     postop n/v  . Abnormal finding on EKG     seen by Dr. Mariah Milling, cleared by cards for surgery  . History of pneumonia   . Bacterial UTI Ecoli 01/03/2012    Treatment began preoperatively with CIPRO 500mg  BID  . Refusal of blood transfusions as patient is Jehovah's Witness   . Osteopenia   . HLD (hyperlipidemia)   . Vitamin D deficiency     Past Surgical History  Procedure  Laterality Date  . Tonsillectomy and adenoidectomy  1945  . Abdominal hysterectomy  1980  . Bunionectomy  2013    R and L foot  . Cataract extraction  2013    R, pending L  . Cosmetic surgery  2002    face lift  . Eye surgery      bilateral cataract surgery  . Knee arthroplasty  01/03/2012    right knee  . Total knee arthroplasty  01/02/2012    Procedure: TOTAL KNEE ARTHROPLASTY;  Surgeon: Nilda Simmer, MD;  Location: [facility] OR;  Service: Orthopedics;  Laterality: Right;  right total knee replacement  . Breast enhancement surgery    . Mohs surgery  2011    R leg  . Colonoscopy  2007    no records received  . Dexa  06/2008    T -1.4 spine and hip  . Total knee arthroplasty Right     Review of Systems Per HPI    Objective:   Physical Exam  Nursing note and vitals reviewed. Constitutional: She appears well-developed and well-nourished. No distress.  HENT:  Mouth/Throat: Oropharynx is clear and moist. No oropharyngeal exudate.  Cardiovascular: Normal rate, regular rhythm, normal heart sounds and intact distal pulses.   No murmur heard. Pulmonary/Chest: Effort normal and breath sounds normal. No respiratory distress. She has no wheezes. She has no  rales.  Abdominal: Soft. Normal appearance and bowel sounds are normal. She exhibits no distension and no mass. There is no hepatosplenomegaly. There is tenderness (moderate) in the right upper quadrant and right lower quadrant. There is guarding and positive Murphy's sign. There is no rigidity, no rebound and no CVA tenderness.  Genitourinary: Pelvic exam was performed with patient supine. There is no rash, tenderness, lesion or injury on the right labia. There is no rash, tenderness, lesion or injury on the left labia. Right adnexum displays no mass, no tenderness and no fullness. Left adnexum displays no mass, no tenderness and no fullness. Vaginal discharge found.  Uterus absent Wet prep sent  Musculoskeletal: She exhibits no edema.   Lymphadenopathy:       Head (right side): No submental, no submandibular and no tonsillar adenopathy present.       Head (left side): No submental, no submandibular and no tonsillar adenopathy present.    She has no cervical adenopathy.    She has no axillary adenopathy.       Right axillary: No lateral adenopathy present.       Left axillary: No lateral adenopathy present.      Right: No supraclavicular adenopathy present.       Left: No supraclavicular adenopathy present.  Skin: Skin is warm and dry. No rash noted. No erythema.       Assessment & Plan:  ADDENDUM ==> received, reviewed records  Transaminitis with AST/ALT low 100s, ALP 151, Tbili 0.3, TP 7.6 WBC 7.1, Hgb 13.9, plt 280, Cr 0.95, lipase 151 UA WNL CT angio chest/abd/pelvis - no aneurysm, multiple small mesenteric lymph nodes and hazy mesenteric fat stranding ?slcerosing mesenteritis vs mesenteric panniculitis, nonobstructing L renal calculus

## 2013-01-03 NOTE — Assessment & Plan Note (Addendum)
R sided abdominal pain along with transaminitis and CT scan with scattered mesenteric lymphadenopathy and fat stranding. Doubt obstruction as currently passing gas well although last BM was 3d go. Discussed CT results with patient, provided with copy. Discussed GI referral, but decided to give time as sxs seem to be slowly improving. No skin changes noted. UA with mod LE but micro unrevealing.  UCx not sent. See below

## 2013-01-03 NOTE — Assessment & Plan Note (Signed)
Sent off wet prep.

## 2013-01-03 NOTE — Addendum Note (Signed)
Addended by: Josph Macho A on: 01/03/2013 03:13 PM   Modules accepted: Orders

## 2013-01-03 NOTE — Assessment & Plan Note (Signed)
Further workup with CBC, rpt LFTs, acute viral hepatitis panel, TSH and iron. Check abd [place] to further eval for gallbladder etiology given RUQ pain. Pt agrees with plan.

## 2013-01-03 NOTE — Patient Instructions (Signed)
Sign release form for records from [facility] of colonoscopy ~2007. We will set up your mammogram at [facility] center (order placed today). Urine checked today. Blood work today. If worsening prior to next week, please call me for referral to gastroenterologists. Continue tramadol for pain.

## 2013-01-04 ENCOUNTER — Other Ambulatory Visit: Payer: Self-pay

## 2013-01-04 ENCOUNTER — Ambulatory Visit
Admission: RE | Admit: 2013-01-04 | Discharge: 2013-01-04 | Disposition: A | Discharge status: Home / Self Care | Payer: Medicare Other | Source: Ambulatory Visit | Attending: Family Medicine | Admitting: Family Medicine

## 2013-01-04 DIAGNOSIS — R7401 Elevation of levels of liver transaminase levels: Secondary | ICD-10-CM

## 2013-01-04 DIAGNOSIS — R109 Unspecified abdominal pain: Secondary | ICD-10-CM

## 2013-01-04 DIAGNOSIS — N200 Calculus of kidney: Secondary | ICD-10-CM | POA: Diagnosis not present

## 2013-01-04 LAB — HEPATITIS PANEL, ACUTE
Hep A IgM: NONREACTIVE
Hep B C IgM: NONREACTIVE

## 2013-01-04 MED ORDER — TRAMADOL HCL 50 MG PO TABS: 50.0000 mg | ORAL_TABLET | Freq: Three times a day (TID) | ORAL | Status: DC | PRN

## 2013-01-04 NOTE — Telephone Encounter (Signed)
Pt left v/m requesting refill tramadol to [facility].Please advise.pt request cb when refilled.

## 2013-01-04 NOTE — Telephone Encounter (Signed)
Med called in and notified patient.

## 2013-01-05 LAB — WET PREP BY MOLECULAR PROBE: Trichomonas vaginosis: NEGATIVE

## 2013-01-14 ENCOUNTER — Encounter: Payer: Self-pay | Admitting: Family Medicine

## 2013-01-14 ENCOUNTER — Ambulatory Visit (INDEPENDENT_AMBULATORY_CARE_PROVIDER_SITE_OTHER): Payer: Medicare Other | Admitting: Family Medicine

## 2013-01-14 VITALS — BP 128/64 | HR 76 | Temp 98.3°F | Ht 65.5 in | Wt 150.8 lb

## 2013-01-14 DIAGNOSIS — M899 Disorder of bone, unspecified: Secondary | ICD-10-CM | POA: Diagnosis not present

## 2013-01-14 DIAGNOSIS — N951 Menopausal and female climacteric states: Secondary | ICD-10-CM

## 2013-01-14 DIAGNOSIS — E785 Hyperlipidemia, unspecified: Secondary | ICD-10-CM | POA: Diagnosis not present

## 2013-01-14 DIAGNOSIS — Z0000 Encounter for general adult medical examination without abnormal findings: Secondary | ICD-10-CM | POA: Diagnosis not present

## 2013-01-14 DIAGNOSIS — F329 Major depressive disorder, single episode, unspecified: Secondary | ICD-10-CM

## 2013-01-14 DIAGNOSIS — Z23 Encounter for immunization: Secondary | ICD-10-CM | POA: Diagnosis not present

## 2013-01-14 DIAGNOSIS — R232 Flushing: Secondary | ICD-10-CM | POA: Insufficient documentation

## 2013-01-14 DIAGNOSIS — F3289 Other specified depressive episodes: Secondary | ICD-10-CM

## 2013-01-14 DIAGNOSIS — M8580 Other specified disorders of bone density and structure, unspecified site: Secondary | ICD-10-CM

## 2013-01-14 DIAGNOSIS — F32A Depression, unspecified: Secondary | ICD-10-CM

## 2013-01-14 DIAGNOSIS — R109 Unspecified abdominal pain: Secondary | ICD-10-CM

## 2013-01-14 NOTE — Assessment & Plan Note (Signed)
Some myalgia - mainly L leg - possibly RYR induced - will decrease dose and monitor sxs.  Update me as needed.

## 2013-01-14 NOTE — Progress Notes (Signed)
Pre-visit discussion using our clinic review tool. No additional management support is needed unless otherwise documented below in the visit note.  

## 2013-01-14 NOTE — Assessment & Plan Note (Signed)
Continue calcium/vit d.

## 2013-01-14 NOTE — Assessment & Plan Note (Signed)
Stable continue cymbalta 

## 2013-01-14 NOTE — Addendum Note (Signed)
Addended by: Josph Macho A on: 01/14/2013 10:10 AM   Modules accepted: Orders

## 2013-01-14 NOTE — Assessment & Plan Note (Signed)
I have personally reviewed the Medicare Annual Wellness questionnaire and have noted 1. The patient's medical and social history 2. Their use of alcohol, tobacco or illicit drugs 3. Their current medications and supplements 4. The patient's functional ability including ADL's, fall risks, home safety risks and hearing or visual impairment. 5. Diet and physical activity 6. Evidence for depression or mood disorders The patients weight, height, BMI have been recorded in the chart.  Hearing and vision has been addressed. I have made referrals, counseling and provided education to the patient based review of the above and I have provided the pt with a written personalized care plan for preventive services. See scanned questionairre. Advanced directives discussed: will bring copy - son is HCPOA  Reviewed preventative protocols and updated unless pt declined. breast exam today - has mammo scheduled Flu shot today UTD colonsocopy and dexa per patient. Deferred gyn exam - s/p hysterectomy.

## 2013-01-14 NOTE — Patient Instructions (Signed)
Let's decrease red yeast rice to one tablet twice daily for next few weeks to see if improvement in leg cramping.  If not better then stop completely - and update me with effect. Call your insurace about the shingles shot to see if it is covered or how much it would cost and where is cheaper (here or pharmacy).  If you want to receive here, call for nurse visit.  Flu shot today. Bring me copy of living will to update your chart. I will await records of colonoscopy. Good to see you today! Call us with questions - let me know if abdominal pain is returning.

## 2013-01-14 NOTE — Assessment & Plan Note (Signed)
Day or night - discussed black cohosh trial.  Update if persistent sxs.

## 2013-01-14 NOTE — Assessment & Plan Note (Signed)
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   Will monitor for now and update me if sxs returning for consideration of GI vs rheum referral. Pt agrees with plan.

## 2013-01-14 NOTE — Progress Notes (Signed)
Subjective:    Patient ID: Kristin Kramer, female    DOB: 08-15-1940, 72 y.o.   MRN: [id number]  HPI CC: medicare wellness visit  HLD - on RYR 1200mg  bid.  Taking coQ10 daily.  For last week noticing L leg cramping worse at night.  abd pain - ?mesenteric adenitis vs slcerosing mesenteritis - no more abd discomfort.  Will just monitor for now.  Knows if sxs returning to notify me for further eval (consider rheum vs GI referral).  Upcoming L knee replacement.  No falls in last year.  Denies anhedonia, anxiety or sadness.   Depression stable. PHQ2 = 3, PHQ9 = 4.  On cymbalta 20mg  daily.  Lives alone. Brother lives nearby.  Occupation: retired, was Diplomatic Services operational officer and housewife  Edu: 1 yr college  Activity: limited by knee osteoarthritis Diet: good water, fruits/vegetables daily   Preventative:  Last medicare wellness visit 06/2011  mammo 2012 - normal, has appt 02/04/2013 Colonoscopy 2009 - WNL, good for 10 yrs according to patient.  Well woman exam - 2012.  Prior PCP. DEXA - thinks done 2012 - thinks osteopenia. Flu shot today zostavax - will check with inusrance. Td unsure. Pneumovax 2013 Advanced directives - HCPOA is son.  Will bring me copy.  Medications and allergies reviewed and updated in chart.  Past histories reviewed and updated if relevant as below. Patient Active Problem List   Diagnosis Date Noted  . Transaminitis 01/03/2013  . Abdominal pain 01/03/2013  . Vaginal discharge 01/03/2013  . Chest pain 06/13/2012  . Osteopenia   . HLD (hyperlipidemia)   . Vitamin D deficiency   . Anxiety 01/05/2012  . Postoperative anemia due to acute blood loss 01/04/2012  . Chest pain on respiration 01/03/2012  . Seasonal allergies   . Squamous cell skin cancer   . Right knee DJD   . Depression   . Migraines   . Abnormal EKG   . Arthritis    Past Medical History  Diagnosis Date  . Seasonal allergies     cats, dust, mold, roaches  . Depression     prior on lexapro, celexa,  wellbutrin.  high cymbalta doses cause tremors  . Migraines   . Squamous cell skin cancer   . Arthritis   . Right knee DJD   . Complication of anesthesia     postop n/v  . Abnormal finding on EKG     seen by Dr. Mariah Milling, cleared by cards for surgery  . History of pneumonia   . Bacterial UTI Ecoli 01/03/2012    Treatment began preoperatively with CIPRO 500mg  BID  . Refusal of blood transfusions as patient is Jehovah's Witness   . Osteopenia   . HLD (hyperlipidemia)   . Vitamin D deficiency    Past Surgical History  Procedure Laterality Date  . Tonsillectomy and adenoidectomy  1945  . Abdominal hysterectomy  1980  . Bunionectomy  2013    R and L foot  . Cataract extraction  2013    R, pending L  . Cosmetic surgery  2002    face lift  . Eye surgery      bilateral cataract surgery  . Knee arthroplasty  01/03/2012    right knee  . Total knee arthroplasty  01/02/2012    Procedure: TOTAL KNEE ARTHROPLASTY;  Surgeon: Nilda Simmer, MD;  Location: [facility] OR;  Service: Orthopedics;  Laterality: Right;  right total knee replacement  . Breast enhancement surgery    . Mohs surgery  2011  R leg  . Colonoscopy  2007    no records received  . Dexa  06/2008    T -1.4 spine and hip  . Total knee arthroplasty Right    History  Substance Use Topics  . Smoking status: Never Smoker   . Smokeless tobacco: Never Used  . Alcohol Use: Yes     Comment: occasional   Family History  Problem Relation Age of Onset  . Cancer Mother     lung, smoker  . Cancer Father     lung, smoker  . Cancer Maternal Grandmother 48    leukemia  . Coronary artery disease Paternal Grandmother 78    sudden cardiac death  . Diabetes Neg Hx   . Stroke Neg Hx    Allergies  Allergen Reactions  . Lipitor [Atorvastatin] Other (See Comments)    Cramps  . Pravastatin Other (See Comments)    Cramps    Current Outpatient Prescriptions on File Prior to Visit  Medication Sig Dispense Refill  . acetaminophen  (TYLENOL) 325 MG tablet Take 650 mg by mouth every 6 (six) hours as needed. For pain      . BEPREVE 1.5 % SOLN 1 drop 2 (two) times daily.       . Biotin 1000 MCG tablet Take 2,000 mcg by mouth daily.      . calcium carbonate (OS-CAL) 600 MG TABS Take 600 mg by mouth 2 (two) times daily with a meal.      . cetirizine (ZYRTEC) 10 MG tablet Take 10 mg by mouth daily.      . Cholecalciferol (VITAMIN D-3) 5000 UNITS TABS Take 5,000 Units by mouth daily.      . DULoxetine (CYMBALTA) 20 MG capsule Take 20 mg by mouth daily.      Marland Kitchen LORazepam (ATIVAN) 0.5 MG tablet Take 0.25 mg by mouth at bedtime as needed. For sleep      . mometasone (NASONEX) 50 MCG/ACT nasal spray Place 2 sprays into the nose daily.      . Omega-3 Fatty Acids (FISH OIL) 1200 MG CAPS Take by mouth daily.      . Probiotic Product (PROBIOTIC PO) Take by mouth daily.      . Red Yeast Rice Extract (RED YEAST RICE PO) Take 1,200 mg by mouth 2 (two) times daily.       . traMADol (ULTRAM) 50 MG tablet Take 1-2 tablets (50-100 mg total) by mouth every 8 (eight) hours as needed for pain.  40 tablet  0  . Turmeric 500 MG CAPS Take by mouth daily.       No current facility-administered medications on file prior to visit.     Review of Systems  Constitutional: Negative for fever, chills, activity change, appetite change, fatigue and unexpected weight change.  HENT: Negative for hearing loss.   Eyes: Negative for visual disturbance.  Respiratory: Negative for cough, chest tightness, shortness of breath and wheezing.   Cardiovascular: Negative for chest pain, palpitations and leg swelling.  Gastrointestinal: Negative for nausea, vomiting, abdominal pain, diarrhea, constipation, blood in stool and abdominal distention.  Genitourinary: Negative for hematuria and difficulty urinating.  Musculoskeletal: Negative for arthralgias, myalgias and neck pain.  Skin: Negative for rash.  Neurological: Negative for dizziness, seizures, syncope and  headaches.  Hematological: Negative for adenopathy. Does not bruise/bleed easily.  Psychiatric/Behavioral: Negative for dysphoric mood. The patient is not nervous/anxious.        Objective:   Physical Exam  Nursing note and vitals  reviewed. Constitutional: She is oriented to person, place, and time. She appears well-developed and well-nourished. No distress.  HENT:  Head: Normocephalic and atraumatic.  Right Ear: Hearing, tympanic membrane, external ear and ear canal normal.  Left Ear: Hearing, tympanic membrane, external ear and ear canal normal.  Nose: Nose normal.  Mouth/Throat: Oropharynx is clear and moist. No oropharyngeal exudate.  Eyes: Conjunctivae and EOM are normal. Pupils are equal, round, and reactive to light. No scleral icterus.  Neck: Normal range of motion. Neck supple. No thyromegaly present.  Cardiovascular: Normal rate, regular rhythm, normal heart sounds and intact distal pulses.   No murmur heard. Pulses:      Radial pulses are 2+ on the right side, and 2+ on the left side.  Pulmonary/Chest: Effort normal and breath sounds normal. No respiratory distress. She has no wheezes. She has no rales. Right breast exhibits no inverted nipple, no mass, no nipple discharge, no skin change and no tenderness. Left breast exhibits no inverted nipple, no mass, no nipple discharge, no skin change and no tenderness. Breasts are symmetrical.  Abdominal: Soft. Bowel sounds are normal. She exhibits no distension and no mass. There is no tenderness. There is no rebound and no guarding.  Musculoskeletal: Normal range of motion. She exhibits no edema.  Lymphadenopathy:    She has no cervical adenopathy.    She has no axillary adenopathy.       Right axillary: No lateral adenopathy present.       Left axillary: No lateral adenopathy present.      Right: No supraclavicular adenopathy present.       Left: No supraclavicular adenopathy present.  Neurological: She is alert and oriented to  person, place, and time.  CN grossly intact, station and gait intact  Skin: Skin is warm and dry. No rash noted.  Psychiatric: She has a normal mood and affect. Her behavior is normal. Judgment and thought content normal.       Assessment & Plan:

## 2013-02-04 ENCOUNTER — Ambulatory Visit
Admission: RE | Admit: 2013-02-04 | Discharge: 2013-02-04 | Disposition: A | Discharge status: Home / Self Care | Payer: Medicare Other | Source: Ambulatory Visit | Attending: Family Medicine | Admitting: Family Medicine

## 2013-02-04 DIAGNOSIS — Z1231 Encounter for screening mammogram for malignant neoplasm of breast: Secondary | ICD-10-CM

## 2013-02-13 ENCOUNTER — Telehealth: Payer: Self-pay

## 2013-02-13 MED ORDER — ZOSTER VACCINE LIVE 19400 UNT/0.65ML ~~LOC~~ SOLR: 0.6500 mL | Freq: Once | SUBCUTANEOUS | Status: DC

## 2013-02-13 NOTE — Telephone Encounter (Signed)
Message left notifying patient.

## 2013-02-13 NOTE — Telephone Encounter (Signed)
Pt left v/m requesting order for shingles vaccine sent to [facility]. Pt request cb when done.

## 2013-02-13 NOTE — Telephone Encounter (Signed)
plz notify sent in. 

## 2013-02-20 LAB — HM MAMMOGRAPHY: HM Mammogram: NORMAL

## 2013-02-21 ENCOUNTER — Encounter: Payer: Self-pay | Admitting: *Deleted

## 2013-02-21 DIAGNOSIS — M1710 Unilateral primary osteoarthritis, unspecified knee: Secondary | ICD-10-CM | POA: Diagnosis not present

## 2013-02-23 ENCOUNTER — Encounter: Payer: Self-pay | Admitting: Family Medicine

## 2013-02-23 ENCOUNTER — Telehealth: Payer: Self-pay | Admitting: Family Medicine

## 2013-02-23 NOTE — Telephone Encounter (Signed)
Received request for clearance from Dr. Thurston Hole for upcoming L total knee replacement. plz call pt - ensure she has not had any recent abd pain, exertional or at rest chest pain, or shortness of breath.  If all negative, ok for medical clearance. Then forward phone note to Dr. Mariah Milling as I'd like to get his input for cardiac clearance prior to her surgery which is scheduled for 03/18/2013.

## 2013-02-26 NOTE — Telephone Encounter (Signed)
Message left for patient to return my call.  

## 2013-02-27 NOTE — Telephone Encounter (Signed)
Message left for patient to return my call.  

## 2013-02-27 NOTE — Telephone Encounter (Signed)
Spoke with patient and she denies any abd pain, SOB or CP with exertion or at rest. She said she hasn't had any symptoms since the episode in October. She said she has felt fine. Phone forwarded to Dr. Mariah Milling. Patient aware awaiting his input.

## 2013-03-04 ENCOUNTER — Telehealth: Payer: Self-pay | Admitting: *Deleted

## 2013-03-04 ENCOUNTER — Telehealth: Payer: Self-pay

## 2013-03-04 NOTE — Telephone Encounter (Signed)
Sent to Dr. Mariah Milling for review.

## 2013-03-04 NOTE — Telephone Encounter (Signed)
Patient called and needs surgical clearance with Dr. Morton Stall (her pcp) for Dr. Sherene Sires office (orthopedist). She is having knee replacement.

## 2013-03-04 NOTE — Telephone Encounter (Signed)
I spoke with patient last week and Dr. Reece Agar cleared her, but she still needs cardiac clearance. I forwarded the note to Dr. Mariah Milling. So, she will have to await his response. I called patient and explained this to her. She verbalized understanding.

## 2013-03-04 NOTE — Telephone Encounter (Signed)
Pt left v/m pt request medical clearance to have TKR to be done by Dr Thurston Hole on 03/18/13. Pt said she had wellness visit on 01/14/14 with Dr Reece Agar and Dr Mariah Milling is out of the office this week. Advised pt usually needs appt to be seen but pt said since seen recently wants to see if Dr Reece Agar will do clearance from that visit. Pt request cb.

## 2013-03-05 ENCOUNTER — Other Ambulatory Visit: Payer: Self-pay | Admitting: Family Medicine

## 2013-03-05 NOTE — Telephone Encounter (Signed)
Please fax clearance to  Dr. Delice Lesch #[phone]

## 2013-03-05 NOTE — Telephone Encounter (Signed)
Left patient voicemail 12/30

## 2013-03-06 ENCOUNTER — Other Ambulatory Visit: Payer: Self-pay | Admitting: Physician Assistant

## 2013-03-06 ENCOUNTER — Encounter: Payer: Self-pay | Admitting: Physician Assistant

## 2013-03-06 DIAGNOSIS — T8859XD Other complications of anesthesia, subsequent encounter: Secondary | ICD-10-CM

## 2013-03-06 DIAGNOSIS — R9431 Abnormal electrocardiogram [ECG] [EKG]: Secondary | ICD-10-CM

## 2013-03-06 DIAGNOSIS — R112 Nausea with vomiting, unspecified: Secondary | ICD-10-CM | POA: Insufficient documentation

## 2013-03-06 DIAGNOSIS — M25569 Pain in unspecified knee: Secondary | ICD-10-CM | POA: Diagnosis not present

## 2013-03-06 DIAGNOSIS — Z9889 Other specified postprocedural states: Secondary | ICD-10-CM

## 2013-03-06 DIAGNOSIS — R109 Unspecified abdominal pain: Secondary | ICD-10-CM

## 2013-03-06 DIAGNOSIS — Z531 Procedure and treatment not carried out because of patient's decision for reasons of belief and group pressure: Secondary | ICD-10-CM | POA: Insufficient documentation

## 2013-03-06 DIAGNOSIS — T4145XD Adverse effect of unspecified anesthetic, subsequent encounter: Secondary | ICD-10-CM

## 2013-03-06 DIAGNOSIS — Z8701 Personal history of pneumonia (recurrent): Secondary | ICD-10-CM

## 2013-03-06 NOTE — Telephone Encounter (Signed)
Will await Dr.Gollan's recs.  Pt aware.

## 2013-03-06 NOTE — Telephone Encounter (Signed)
Pt checking on status of cymbalta refill; advised pt refilled to [facility]. Pt will ck with pharmacy.

## 2013-03-06 NOTE — H&P (Signed)
TOTAL KNEE ADMISSION H&P  Patient is being admitted for left total knee arthroplasty.  Subjective:  Chief Complaint:left knee pain.  HPI: Kristin Kramer, 72 y.o. female, has a history of pain and functional disability in the left knee due to arthritis and has failed non-surgical conservative treatments for greater than 12 weeks to includeNSAID's and/or analgesics, corticosteriod injections, viscosupplementation injections, flexibility and strengthening excercises, supervised PT with diminished ADL's post treatment, use of assistive devices and activity modification.  Onset of symptoms was gradual, starting 10 years ago with gradually worsening course since that time. The patient noted prior procedures on the knee to include  arthroscopy and menisectomy on the left knee(s).  Patient currently rates pain in the left knee(s) at 10 out of 10 with activity. Patient has night pain, worsening of pain with activity and weight bearing, pain that interferes with activities of daily living, crepitus and joint swelling.  Patient has evidence of subchondral sclerosis, periarticular osteophytes and joint space narrowing by imaging studies.  There is no active infection.  Patient Active Problem List   Diagnosis Date Noted  . Complication of anesthesia   . Abnormal finding on EKG   . History of pneumonia   . Post-operative nausea and vomiting   . Refusal of blood transfusions as patient is Jehovah's Witness   . Abdominal  pain, other specified site   . Medicare annual wellness visit, subsequent 01/14/2013  . Hot flashes 01/14/2013  . Transaminitis 01/03/2013  . Abdominal pain 01/03/2013  . Vaginal discharge 01/03/2013  . Chest pain 06/13/2012  . Osteopenia   . HLD (hyperlipidemia)   . Vitamin D deficiency   . Anxiety 01/05/2012  . Chest pain on respiration 01/03/2012  . Seasonal allergies   . Squamous cell skin cancer   . Right knee DJD   . Depression   . Migraines   . Abnormal EKG   . Arthritis     Past Medical History  Diagnosis Date  . Seasonal allergies     cats, dust, mold, roaches  . Depression     prior on lexapro, celexa, wellbutrin.  high cymbalta doses cause tremors  . Migraines   . Squamous cell skin cancer   . Arthritis   . Knee osteoarthritis 2013, 2014    bilateral s/p R TKR, pending L TKR Noemi Chapel)  . Post-operative nausea and vomiting     postop n/v  . Abnormal finding on EKG     seen by Dr. Rockey Situ, cleared by cards for surgery  . History of pneumonia   . Bacterial UTI Ecoli 01/03/2012    Treatment began preoperatively with CIPRO 500mg  BID  . Refusal of blood transfusions as patient is Jehovah's Witness   . Osteopenia   . HLD (hyperlipidemia)   . Vitamin D deficiency   . 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 Tonsillectomy and adenoidectomy  1945  . Abdominal hysterectomy  1980  . Bunionectomy  2013    R and L foot  . Cataract extraction  2013    R, pending L  . Cosmetic surgery  2002    face lift  . Eye surgery      bilateral cataract surgery  . Knee arthroplasty  01/03/2012    right knee  . Total knee arthroplasty  01/02/2012    Procedure: TOTAL KNEE ARTHROPLASTY;  Surgeon: Audree Camel  Noemi Chapel, MD;  Location: [place];  Service: Orthopedics;  Laterality: Right;  right total knee replacement  . Breast enhancement surgery    . Mohs surgery  2011    R leg  . Colonoscopy  2007    no records received  . Dexa  06/2008    T -1.4 spine and hip  . Total knee arthroplasty Right      (Not in a hospital admission) Allergies  Allergen Reactions  . Lipitor [Atorvastatin] Other (See Comments)    Cramps  . Pravastatin Other (See Comments)    Cramps     Current Outpatient Prescriptions on File Prior to Visit  Medication Sig Dispense Refill  . BEPREVE 1.5 % SOLN 1 drop 2 (two) times daily.       . Biotin  1000 MCG tablet Take 2,000 mcg by mouth daily.      . calcium carbonate (OS-CAL) 600 MG TABS Take 600 mg by mouth 2 (two) times daily with a meal.      . cetirizine (ZYRTEC) 10 MG tablet Take 10 mg by mouth daily.      . Cholecalciferol (VITAMIN D-3) 5000 UNITS TABS Take 5,000 Units by mouth daily.      . Coenzyme Q10 (CO Q10) 100 MG CAPS Take 1 capsule by mouth daily.      . DULoxetine (CYMBALTA) 20 MG capsule TAKE ONE CAPSULE BY MOUTH EVERY DAY  90 capsule  1  . LORazepam (ATIVAN) 0.5 MG tablet Take 0.25 mg by mouth at bedtime as needed. For sleep      . mometasone (NASONEX) 50 MCG/ACT nasal spray Place 2 sprays into the nose daily.      . Omega-3 Fatty Acids (FISH OIL) 1200 MG CAPS Take by mouth daily.      . Probiotic Product (PROBIOTIC PO) Take by mouth daily.      . Turmeric 500 MG CAPS Take by mouth daily.      Marland Kitchen acetaminophen (TYLENOL) 325 MG tablet Take 650 mg by mouth every 6 (six) hours as needed. For pain      . Red Yeast Rice Extract (RED YEAST RICE PO) Take 1,200 mg by mouth 2 (two) times daily.       . traMADol (ULTRAM) 50 MG tablet Take 1-2 tablets (50-100 mg total) by mouth every 8 (eight) hours as needed for pain.  40 tablet  0  . zoster vaccine live, PF, (ZOSTAVAX) 40347 UNT/0.65ML injection Inject 19,400 Units into the skin once.  1 each  0   No current facility-administered medications on file prior to visit.   History  Substance Use Topics  . Smoking status: Never Smoker   . Smokeless tobacco: Never Used  . Alcohol Use: Yes     Comment: occasional    Family History  Problem Relation Age of Onset  . Cancer Mother     lung, smoker  . Cancer Father     lung, smoker  . Cancer Maternal Grandmother 63    leukemia  . Coronary artery disease Paternal Grandmother 57    sudden cardiac death  . Diabetes Neg Hx   . Stroke Neg Hx      Review of Systems  Constitutional: Negative.   HENT: Negative.   Eyes: Negative.   Respiratory: Positive for cough. Negative for  hemoptysis, sputum production, shortness of breath and wheezing.   Cardiovascular: Negative.   Gastrointestinal: Negative.   Genitourinary: Negative.   Musculoskeletal: Positive for joint pain.  Left knee pain  Skin: Negative.   Neurological: Negative.   Endo/Heme/Allergies: Negative.   Psychiatric/Behavioral: Negative.     Objective:  Physical Exam  Constitutional: She is oriented to person, place, and time. She appears well-developed and well-nourished.  HENT:  Head: Normocephalic and atraumatic.  Mouth/Throat: Oropharynx is clear and moist.  Eyes: Conjunctivae and EOM are normal. Pupils are equal, round, and reactive to light.  Neck: Normal range of motion. Neck supple.  Cardiovascular: Normal rate, regular rhythm and normal heart sounds.   Respiratory: Effort normal and breath sounds normal.  GI: Soft. Bowel sounds are normal.  Genitourinary:  Not pertinent to current symptomatology therefore not examined.  Musculoskeletal:  Well-developed well-nourished white female in no acute distress. Alert and oriented. Examination of her left knee reveals pain medially and laterally, 1+ crepitation 1+ synovitis, full range of motion knee is stable with normal patella tracking. Exam of the right knee reveals well healed total knee incision without swelling or pain full range of motion knee is stable with normal patella tracking. Vascular exam: pulses 2+ and symmetric.  Neurological: She is alert and oriented to person, place, and time.  Skin: Skin is warm and dry.  Psychiatric: She has a normal mood and affect. Her behavior is normal.    Vital signs in last 24 hours: Last recorded: 12/31 0900   BP: 136/74 Pulse: 70  Temp: 98.3 F (36.8 C)    Height: 5' 6" (1.676 m) SpO2: 99  Weight: 69.854 kg (154 lb)     Labs:   Estimated body mass index is 24.87 kg/(m^2) as calculated from the following:   Height as of this encounter: 5' 6" (1.676 m).   Weight as of this encounter:  69.854 kg (154 lb).   Imaging Review Plain radiographs demonstrate severe degenerative joint disease of the left knee(s). The overall alignment ismild valgus. The bone quality appears to be good for age and reported activity level.  Assessment/Plan:  End stage arthritis, left knee  Patient Active Problem List   Diagnosis Date Noted  . Complication of anesthesia   . Abnormal finding on EKG   . History of pneumonia   . Post-operative nausea and vomiting   . Refusal of blood transfusions as patient is Jehovah's Witness   . Abdominal  pain, other specified site   . Medicare annual wellness visit, subsequent 01/14/2013  . Hot flashes 01/14/2013  . Transaminitis 01/03/2013  . Abdominal pain 01/03/2013  . Vaginal discharge 01/03/2013  . Chest pain 06/13/2012  . Osteopenia   . HLD (hyperlipidemia)   . Vitamin D deficiency   . Anxiety 01/05/2012  . Chest pain on respiration 01/03/2012  . Seasonal allergies   . Squamous cell skin cancer   . Right knee DJD   . Depression   . Migraines   . Abnormal EKG   . Arthritis     The patient history, physical examination, clinical judgment of the provider and imaging studies are consistent with end stage degenerative joint disease of the left knee(s) and total knee arthroplasty is deemed medically necessary. The treatment options including medical management, injection therapy arthroscopy and arthroplasty were discussed at length. The risks and benefits of total knee arthroplasty were presented and reviewed. The risks due to aseptic loosening, infection, stiffness, patella tracking problems, thromboembolic complications and other imponderables were discussed. The patient acknowledged the explanation, agreed to proceed with the plan and consent was signed. Patient is being admitted for inpatient treatment for surgery, pain control, PT,   OT, prophylactic antibiotics, VTE prophylaxis, progressive ambulation and ADL's and discharge planning. The patient is  planning to be discharged to skilled nursing facility [facility]. Kaleen Kramer Physician Assistant [facility] [phone]  03/06/2013, 10:40 AM Kristin Kramer Physician Assistant [facility] [phone]  03/06/2013, 10:34 AM

## 2013-03-07 NOTE — Telephone Encounter (Signed)
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 Can send a note to surgery

## 2013-03-08 ENCOUNTER — Encounter: Payer: Self-pay | Admitting: *Deleted

## 2013-03-08 NOTE — Pre-Procedure Instructions (Signed)
Ahtziri Jeffries  03/08/2013   Your procedure is scheduled on:  Monday March 18, 2013 @ 12:15 PM.  Report to [facility] Short Stay Entrance "A"  Admitting at 10:15 AM.  Call this number if you have problems the morning of surgery: [phone]   Remember:   Do not eat food or drink liquids after midnight.   Take these medicines the morning of surgery with A SIP OF WATER: Acetaminophen (Tylenol) if needed for pain, Cetirizine (Zyrtec), Duloxetine (Cymbalta), Nasonex nasal spray, and Tramadol (Ultram) if needed for pain.   Do not wear jewelry, make-up or nail polish.  Do not wear lotions, powders, or perfumes. You may wear deodorant.  Do not shave 48 hours prior to surgery.   Do not bring valuables to the hospital.  [facility] is not responsible for any belongings or valuables.               Contacts, dentures or bridgework may not be worn into surgery.  Leave suitcase in the car. After surgery it may be brought to your room.  For patients admitted to the hospital, discharge time is determined by your treatment team.               Patients discharged the day of surgery will not be allowed to drive home.  Name and phone number of your driver: Family/Friend  Special Instructions: Shower using CHG 2 nights before surgery and the night before surgery.  If you shower the day of surgery use CHG.  Use special wash - you have one bottle of CHG for all showers.  You should use approximately 1/3 of the bottle for each shower.   Please read over the following fact sheets that you were given: Pain Booklet, Coughing and Deep Breathing, Blood Transfusion Information, Total Joint Packet, MRSA Information and Surgical Site Infection Prevention

## 2013-03-08 NOTE — Telephone Encounter (Signed)
Faxed clearance letter to Dr.Gutierrez's office.

## 2013-03-08 NOTE — Telephone Encounter (Signed)
Dr. Noemi Chapel notified. Thanks Estill Bamberg!

## 2013-03-08 NOTE — Telephone Encounter (Signed)
plz notify surgery.

## 2013-03-11 ENCOUNTER — Encounter (HOSPITAL_COMMUNITY): Payer: Self-pay

## 2013-03-11 ENCOUNTER — Ambulatory Visit (HOSPITAL_COMMUNITY)
Admission: RE | Admit: 2013-03-11 | Discharge: 2013-03-11 | Disposition: A | Discharge status: Home / Self Care | Payer: Medicare Other | Source: Ambulatory Visit | Attending: Physician Assistant | Admitting: Physician Assistant

## 2013-03-11 ENCOUNTER — Encounter (HOSPITAL_COMMUNITY)
Admission: RE | Admit: 2013-03-11 | Discharge: 2013-03-11 | Disposition: A | Discharge status: Home / Self Care | Payer: Medicare Other | Source: Ambulatory Visit | Attending: Orthopedic Surgery | Admitting: Orthopedic Surgery

## 2013-03-11 DIAGNOSIS — M1710 Unilateral primary osteoarthritis, unspecified knee: Secondary | ICD-10-CM | POA: Diagnosis not present

## 2013-03-11 DIAGNOSIS — R091 Pleurisy: Secondary | ICD-10-CM | POA: Diagnosis not present

## 2013-03-11 DIAGNOSIS — Z01818 Encounter for other preprocedural examination: Secondary | ICD-10-CM | POA: Diagnosis not present

## 2013-03-11 DIAGNOSIS — Z01812 Encounter for preprocedural laboratory examination: Secondary | ICD-10-CM | POA: Insufficient documentation

## 2013-03-11 DIAGNOSIS — J9819 Other pulmonary collapse: Secondary | ICD-10-CM | POA: Diagnosis not present

## 2013-03-11 HISTORY — DX: Allergy status to unspecified drugs, medicaments and biological substances: Z88.9

## 2013-03-11 LAB — APTT: APTT: 29 s (ref 24–37)

## 2013-03-11 LAB — COMPREHENSIVE METABOLIC PANEL
ALK PHOS: 138 U/L — AB (ref 39–117)
ALT: 25 U/L (ref 0–35)
AST: 27 U/L (ref 0–37)
Albumin: 4.2 g/dL (ref 3.5–5.2)
BILIRUBIN TOTAL: 0.4 mg/dL (ref 0.3–1.2)
BUN: 14 mg/dL (ref 6–23)
CHLORIDE: 103 meq/L (ref 96–112)
CO2: 28 meq/L (ref 19–32)
Calcium: 10.1 mg/dL (ref 8.4–10.5)
Creatinine, Ser: 0.7 mg/dL (ref 0.50–1.10)
GFR calc Af Amer: >90 mL/min (ref >90)
GFR calc non Af Amer: 85 mL/min — ABNORMAL LOW (ref >90)
Glucose, Bld: 98 mg/dL (ref 70–99)
POTASSIUM: 4.4 meq/L (ref 3.7–5.3)
Sodium: 144 mEq/L (ref 137–147)
Total Protein: 8 g/dL (ref 6.0–8.3)

## 2013-03-11 LAB — URINALYSIS, ROUTINE W REFLEX MICROSCOPIC
BILIRUBIN URINE: NEGATIVE
Glucose, UA: NEGATIVE mg/dL
HGB URINE DIPSTICK: NEGATIVE
Ketones, ur: NEGATIVE mg/dL
Leukocytes, UA: NEGATIVE
Nitrite: NEGATIVE
Protein, ur: NEGATIVE mg/dL
Specific Gravity, Urine: 1.006 (ref 1.005–1.030)
UROBILINOGEN UA: 0.2 mg/dL (ref 0.0–1.0)
pH: 7.5 (ref 5.0–8.0)

## 2013-03-11 LAB — CBC WITH DIFFERENTIAL/PLATELET
BASOS PCT: 1 % (ref 0–1)
Basophils Absolute: 0 10*3/uL (ref 0.0–0.1)
Eosinophils Absolute: 0.5 10*3/uL (ref 0.0–0.7)
Eosinophils Relative: 9 % — ABNORMAL HIGH (ref 0–5)
HEMATOCRIT: 43.7 % (ref 36.0–46.0)
HEMOGLOBIN: 14.7 g/dL (ref 12.0–15.0)
LYMPHS ABS: 2 10*3/uL (ref 0.7–4.0)
LYMPHS PCT: 33 % (ref 12–46)
MCH: 30.4 pg (ref 26.0–34.0)
MCHC: 33.6 g/dL (ref 30.0–36.0)
MCV: 90.3 fL (ref 78.0–100.0)
MONOS PCT: 7 % (ref 3–12)
Monocytes Absolute: 0.4 10*3/uL (ref 0.1–1.0)
NEUTROS ABS: 3.2 10*3/uL (ref 1.7–7.7)
Neutrophils Relative %: 52 % (ref 43–77)
Platelets: 268 10*3/uL (ref 150–400)
RBC: 4.84 MIL/uL (ref 3.87–5.11)
RDW: 13 % (ref 11.5–15.5)
WBC: 6.1 10*3/uL (ref 4.0–10.5)

## 2013-03-11 LAB — SURGICAL PCR SCREEN
MRSA, PCR: NEGATIVE
Staphylococcus aureus: NEGATIVE

## 2013-03-11 LAB — NO BLOOD PRODUCTS

## 2013-03-11 LAB — PROTIME-INR
INR: 0.93 (ref 0.00–1.49)
Prothrombin Time: 12.3 seconds (ref 11.6–15.2)

## 2013-03-11 NOTE — Progress Notes (Signed)
Patient denied having any acute cardiac issues. Patient refused to have a blood transfusion therefore the refusal sheet was signed and sent to blood bank, patient stated that Dr. Noemi Chapel was already aware of refusal. PCP is Dr. [name] and cardiologist is Ida Rogue.

## 2013-03-12 LAB — URINE CULTURE
COLONY COUNT: NO GROWTH
CULTURE: NO GROWTH

## 2013-03-15 NOTE — Progress Notes (Signed)
Spoke with patient and instructed her to arrive at 830 am 03/18/13.

## 2013-03-17 MED ORDER — CHLORHEXIDINE GLUCONATE 4 % EX LIQD: 60.0000 mL | Freq: Once | CUTANEOUS | Status: DC

## 2013-03-17 MED ORDER — TRANEXAMIC ACID 100 MG/ML IV SOLN
1000.0000 mg | INTRAVENOUS | Status: AC
  Administered 2013-03-18: 1000 mg via INTRAVENOUS
  Filled 2013-03-17 (×2): qty 10

## 2013-03-17 MED ORDER — CEFAZOLIN SODIUM-DEXTROSE 2-3 GM-% IV SOLR
2.0000 g | INTRAVENOUS | Status: AC
  Administered 2013-03-18: 2 g via INTRAVENOUS
  Filled 2013-03-17: qty 50

## 2013-03-17 MED ORDER — LACTATED RINGERS IV SOLN
INTRAVENOUS | Status: DC
  Administered 2013-03-18: 10 mL/h via INTRAVENOUS
  Administered 2013-03-18: 13:00:00 via INTRAVENOUS

## 2013-03-17 MED ORDER — POVIDONE-IODINE 7.5 % EX SOLN
Freq: Once | CUTANEOUS | Status: DC
  Filled 2013-03-17: qty 118

## 2013-03-18 ENCOUNTER — Inpatient Hospital Stay (HOSPITAL_COMMUNITY)
Admission: RE | Admit: 2013-03-18 | Discharge: 2013-03-21 | DRG: 470 | Disposition: A | Discharge status: Skilled Nursing Facility | Payer: Medicare Other | Source: Ambulatory Visit | Attending: Orthopedic Surgery | Admitting: Orthopedic Surgery

## 2013-03-18 ENCOUNTER — Encounter (HOSPITAL_COMMUNITY): Payer: Medicare Other | Admitting: Anesthesiology

## 2013-03-18 ENCOUNTER — Encounter (HOSPITAL_COMMUNITY): Payer: Self-pay | Admitting: *Deleted

## 2013-03-18 ENCOUNTER — Encounter (HOSPITAL_COMMUNITY)
Admission: RE | Disposition: A | Discharge status: Skilled Nursing Facility | Payer: Self-pay | Source: Ambulatory Visit | Attending: Orthopedic Surgery

## 2013-03-18 ENCOUNTER — Inpatient Hospital Stay (HOSPITAL_COMMUNITY): Payer: Medicare Other | Admitting: Anesthesiology

## 2013-03-18 DIAGNOSIS — Z96659 Presence of unspecified artificial knee joint: Secondary | ICD-10-CM

## 2013-03-18 DIAGNOSIS — M179 Osteoarthritis of knee, unspecified: Secondary | ICD-10-CM | POA: Diagnosis present

## 2013-03-18 DIAGNOSIS — F419 Anxiety disorder, unspecified: Secondary | ICD-10-CM | POA: Diagnosis present

## 2013-03-18 DIAGNOSIS — F329 Major depressive disorder, single episode, unspecified: Secondary | ICD-10-CM | POA: Diagnosis present

## 2013-03-18 DIAGNOSIS — Z888 Allergy status to other drugs, medicaments and biological substances status: Secondary | ICD-10-CM | POA: Diagnosis not present

## 2013-03-18 DIAGNOSIS — Z8249 Family history of ischemic heart disease and other diseases of the circulatory system: Secondary | ICD-10-CM | POA: Diagnosis not present

## 2013-03-18 DIAGNOSIS — M1710 Unilateral primary osteoarthritis, unspecified knee: Principal | ICD-10-CM | POA: Diagnosis present

## 2013-03-18 DIAGNOSIS — E785 Hyperlipidemia, unspecified: Secondary | ICD-10-CM | POA: Diagnosis present

## 2013-03-18 DIAGNOSIS — F411 Generalized anxiety disorder: Secondary | ICD-10-CM | POA: Diagnosis present

## 2013-03-18 DIAGNOSIS — S8990XA Unspecified injury of unspecified lower leg, initial encounter: Secondary | ICD-10-CM | POA: Diagnosis not present

## 2013-03-18 DIAGNOSIS — G8918 Other acute postprocedural pain: Secondary | ICD-10-CM | POA: Diagnosis not present

## 2013-03-18 DIAGNOSIS — E559 Vitamin D deficiency, unspecified: Secondary | ICD-10-CM | POA: Diagnosis not present

## 2013-03-18 DIAGNOSIS — Z531 Procedure and treatment not carried out because of patient's decision for reasons of belief and group pressure: Secondary | ICD-10-CM

## 2013-03-18 DIAGNOSIS — M1990 Unspecified osteoarthritis, unspecified site: Secondary | ICD-10-CM | POA: Diagnosis not present

## 2013-03-18 DIAGNOSIS — M79609 Pain in unspecified limb: Secondary | ICD-10-CM | POA: Diagnosis not present

## 2013-03-18 DIAGNOSIS — M899 Disorder of bone, unspecified: Secondary | ICD-10-CM | POA: Diagnosis present

## 2013-03-18 DIAGNOSIS — F3289 Other specified depressive episodes: Secondary | ICD-10-CM | POA: Diagnosis present

## 2013-03-18 DIAGNOSIS — R112 Nausea with vomiting, unspecified: Secondary | ICD-10-CM | POA: Diagnosis present

## 2013-03-18 DIAGNOSIS — Z4789 Encounter for other orthopedic aftercare: Secondary | ICD-10-CM | POA: Diagnosis not present

## 2013-03-18 DIAGNOSIS — Z9889 Other specified postprocedural states: Secondary | ICD-10-CM | POA: Diagnosis present

## 2013-03-18 DIAGNOSIS — Z85828 Personal history of other malignant neoplasm of skin: Secondary | ICD-10-CM

## 2013-03-18 DIAGNOSIS — S99919A Unspecified injury of unspecified ankle, initial encounter: Secondary | ICD-10-CM | POA: Diagnosis not present

## 2013-03-18 DIAGNOSIS — G43909 Migraine, unspecified, not intractable, without status migrainosus: Secondary | ICD-10-CM | POA: Diagnosis not present

## 2013-03-18 DIAGNOSIS — Z471 Aftercare following joint replacement surgery: Secondary | ICD-10-CM | POA: Diagnosis not present

## 2013-03-18 DIAGNOSIS — G43109 Migraine with aura, not intractable, without status migrainosus: Secondary | ICD-10-CM | POA: Diagnosis not present

## 2013-03-18 DIAGNOSIS — R279 Unspecified lack of coordination: Secondary | ICD-10-CM | POA: Diagnosis not present

## 2013-03-18 DIAGNOSIS — IMO0002 Reserved for concepts with insufficient information to code with codable children: Secondary | ICD-10-CM | POA: Diagnosis not present

## 2013-03-18 DIAGNOSIS — R262 Difficulty in walking, not elsewhere classified: Secondary | ICD-10-CM | POA: Diagnosis not present

## 2013-03-18 DIAGNOSIS — M1711 Unilateral primary osteoarthritis, right knee: Secondary | ICD-10-CM

## 2013-03-18 DIAGNOSIS — R9431 Abnormal electrocardiogram [ECG] [EKG]: Secondary | ICD-10-CM | POA: Diagnosis present

## 2013-03-18 DIAGNOSIS — M6281 Muscle weakness (generalized): Secondary | ICD-10-CM | POA: Diagnosis not present

## 2013-03-18 DIAGNOSIS — M949 Disorder of cartilage, unspecified: Secondary | ICD-10-CM

## 2013-03-18 DIAGNOSIS — F331 Major depressive disorder, recurrent, moderate: Secondary | ICD-10-CM | POA: Diagnosis present

## 2013-03-18 DIAGNOSIS — M25569 Pain in unspecified knee: Secondary | ICD-10-CM | POA: Diagnosis not present

## 2013-03-18 HISTORY — DX: Osteoarthritis of knee, unspecified: M17.9

## 2013-03-18 HISTORY — PX: TOTAL KNEE ARTHROPLASTY: SHX125

## 2013-03-18 SURGERY — ARTHROPLASTY, KNEE, TOTAL
Anesthesia: General | Site: Knee | Laterality: Left

## 2013-03-18 MED ORDER — METOCLOPRAMIDE HCL 5 MG/ML IJ SOLN: 5.0000 mg | Freq: Three times a day (TID) | INTRAMUSCULAR | Status: DC | PRN

## 2013-03-18 MED ORDER — ASPIRIN EC 325 MG PO TBEC
325.0000 mg | DELAYED_RELEASE_TABLET | Freq: Every day | ORAL | Status: DC
  Administered 2013-03-19 – 2013-03-21 (×3): 325 mg via ORAL
  Filled 2013-03-18 (×4): qty 1

## 2013-03-18 MED ORDER — PHENYLEPHRINE HCL 10 MG/ML IJ SOLN
INTRAMUSCULAR | Status: DC | PRN
  Administered 2013-03-18: 40 ug via INTRAVENOUS

## 2013-03-18 MED ORDER — CEFUROXIME SODIUM 1.5 G IJ SOLR
INTRAMUSCULAR | Status: DC | PRN
  Administered 2013-03-18: 1.5 g

## 2013-03-18 MED ORDER — BUPIVACAINE-EPINEPHRINE (PF) 0.25% -1:200000 IJ SOLN
INTRAMUSCULAR | Status: AC
  Filled 2013-03-18: qty 30

## 2013-03-18 MED ORDER — DEXAMETHASONE 6 MG PO TABS
10.0000 mg | ORAL_TABLET | Freq: Three times a day (TID) | ORAL | Status: AC
  Administered 2013-03-18 – 2013-03-19 (×3): 10 mg via ORAL
  Filled 2013-03-18 (×3): qty 1

## 2013-03-18 MED ORDER — POTASSIUM CHLORIDE IN NACL 20-0.9 MEQ/L-% IV SOLN
INTRAVENOUS | Status: DC
  Administered 2013-03-18: 22:00:00 via INTRAVENOUS
  Administered 2013-03-19: 1000 mL via INTRAVENOUS
  Administered 2013-03-20: 02:00:00 via INTRAVENOUS
  Filled 2013-03-18 (×8): qty 1000

## 2013-03-18 MED ORDER — OXYCODONE HCL 5 MG/5ML PO SOLN: 5.0000 mg | Freq: Once | ORAL | Status: AC | PRN

## 2013-03-18 MED ORDER — CALCIUM CARBONATE 1250 (500 CA) MG PO TABS
1.0000 | ORAL_TABLET | Freq: Two times a day (BID) | ORAL | Status: DC
  Administered 2013-03-19 – 2013-03-21 (×5): 500 mg via ORAL
  Filled 2013-03-18 (×8): qty 1

## 2013-03-18 MED ORDER — HYDROMORPHONE HCL PF 1 MG/ML IJ SOLN
INTRAMUSCULAR | Status: AC
  Filled 2013-03-18: qty 1

## 2013-03-18 MED ORDER — MIDAZOLAM HCL 2 MG/2ML IJ SOLN
INTRAMUSCULAR | Status: AC
  Administered 2013-03-18: 1 mg
  Filled 2013-03-18: qty 2

## 2013-03-18 MED ORDER — BUPIVACAINE-EPINEPHRINE 0.25% -1:200000 IJ SOLN
INTRAMUSCULAR | Status: DC | PRN
  Administered 2013-03-18: 30 mL

## 2013-03-18 MED ORDER — SODIUM CHLORIDE 0.9 % IR SOLN
Status: DC | PRN
  Administered 2013-03-18: 4000 mL

## 2013-03-18 MED ORDER — FENTANYL CITRATE 0.05 MG/ML IJ SOLN
INTRAMUSCULAR | Status: DC | PRN
  Administered 2013-03-18: 50 ug via INTRAVENOUS
  Administered 2013-03-18: 100 ug via INTRAVENOUS
  Administered 2013-03-18 (×2): 50 ug via INTRAVENOUS

## 2013-03-18 MED ORDER — ZOLPIDEM TARTRATE 5 MG PO TABS
5.0000 mg | ORAL_TABLET | Freq: Every evening | ORAL | Status: DC | PRN
  Administered 2013-03-20 (×2): 5 mg via ORAL
  Filled 2013-03-18 (×2): qty 1

## 2013-03-18 MED ORDER — HYDROMORPHONE HCL PF 1 MG/ML IJ SOLN
1.0000 mg | INTRAMUSCULAR | Status: DC | PRN
  Administered 2013-03-18 – 2013-03-19 (×3): 1 mg via INTRAVENOUS
  Filled 2013-03-18 (×3): qty 1

## 2013-03-18 MED ORDER — ONDANSETRON HCL 4 MG/2ML IJ SOLN: 4.0000 mg | Freq: Once | INTRAMUSCULAR | Status: DC | PRN

## 2013-03-18 MED ORDER — DOCUSATE SODIUM 100 MG PO CAPS
100.0000 mg | ORAL_CAPSULE | Freq: Two times a day (BID) | ORAL | Status: DC
  Administered 2013-03-18 – 2013-03-21 (×6): 100 mg via ORAL
  Filled 2013-03-18 (×8): qty 1

## 2013-03-18 MED ORDER — VITAMIN D3 25 MCG (1000 UNIT) PO TABS
5000.0000 [IU] | ORAL_TABLET | Freq: Every day | ORAL | Status: DC
  Administered 2013-03-19 – 2013-03-21 (×3): 5000 [IU] via ORAL
  Filled 2013-03-18 (×4): qty 5

## 2013-03-18 MED ORDER — MENTHOL 3 MG MT LOZG: 1.0000 | LOZENGE | OROMUCOSAL | Status: DC | PRN

## 2013-03-18 MED ORDER — PROPOFOL 10 MG/ML IV BOLUS
INTRAVENOUS | Status: DC | PRN
  Administered 2013-03-18: 180 mg via INTRAVENOUS

## 2013-03-18 MED ORDER — DIPHENHYDRAMINE HCL 12.5 MG/5ML PO ELIX: 12.5000 mg | ORAL_SOLUTION | ORAL | Status: DC | PRN

## 2013-03-18 MED ORDER — CEFAZOLIN SODIUM 1-5 GM-% IV SOLN
1.0000 g | Freq: Four times a day (QID) | INTRAVENOUS | Status: AC
  Administered 2013-03-18 (×2): 1 g via INTRAVENOUS
  Filled 2013-03-18 (×3): qty 50

## 2013-03-18 MED ORDER — DEXAMETHASONE SODIUM PHOSPHATE 10 MG/ML IJ SOLN
10.0000 mg | Freq: Three times a day (TID) | INTRAMUSCULAR | Status: AC
  Filled 2013-03-18 (×3): qty 1

## 2013-03-18 MED ORDER — HYDROMORPHONE HCL PF 1 MG/ML IJ SOLN
0.2500 mg | INTRAMUSCULAR | Status: DC | PRN
  Administered 2013-03-18 (×4): 0.5 mg via INTRAVENOUS

## 2013-03-18 MED ORDER — ONDANSETRON HCL 4 MG/2ML IJ SOLN
INTRAMUSCULAR | Status: DC | PRN
  Administered 2013-03-18: 4 mg via INTRAVENOUS

## 2013-03-18 MED ORDER — PHENOL 1.4 % MT LIQD: 1.0000 | OROMUCOSAL | Status: DC | PRN

## 2013-03-18 MED ORDER — DULOXETINE HCL 20 MG PO CPEP
20.0000 mg | ORAL_CAPSULE | Freq: Every day | ORAL | Status: DC
Start: 2013-03-19 — End: 2013-03-21
  Administered 2013-03-19 – 2013-03-21 (×3): 20 mg via ORAL
  Filled 2013-03-18 (×3): qty 1

## 2013-03-18 MED ORDER — OXYCODONE HCL 5 MG PO TABS
5.0000 mg | ORAL_TABLET | ORAL | Status: DC | PRN
  Administered 2013-03-18 – 2013-03-20 (×7): 10 mg via ORAL
  Administered 2013-03-20: 5 mg via ORAL
  Administered 2013-03-20 (×2): 10 mg via ORAL
  Administered 2013-03-20: 5 mg via ORAL
  Administered 2013-03-20 – 2013-03-21 (×2): 10 mg via ORAL
  Filled 2013-03-18 (×2): qty 2
  Filled 2013-03-18: qty 1
  Filled 2013-03-18 (×6): qty 2
  Filled 2013-03-18: qty 1
  Filled 2013-03-18 (×3): qty 2

## 2013-03-18 MED ORDER — OXYCODONE HCL 5 MG PO TABS
5.0000 mg | ORAL_TABLET | Freq: Once | ORAL | Status: AC | PRN
  Administered 2013-03-18: 5 mg via ORAL

## 2013-03-18 MED ORDER — EPHEDRINE SULFATE 50 MG/ML IJ SOLN
INTRAMUSCULAR | Status: DC | PRN
  Administered 2013-03-18 (×2): 10 mg via INTRAVENOUS

## 2013-03-18 MED ORDER — OXYCODONE HCL 5 MG PO TABS
ORAL_TABLET | ORAL | Status: AC
  Filled 2013-03-18: qty 1

## 2013-03-18 MED ORDER — ONDANSETRON HCL 4 MG/2ML IJ SOLN: 4.0000 mg | Freq: Four times a day (QID) | INTRAMUSCULAR | Status: DC | PRN

## 2013-03-18 MED ORDER — LORAZEPAM 0.5 MG PO TABS: 0.5000 mg | ORAL_TABLET | Freq: Every evening | ORAL | Status: DC | PRN

## 2013-03-18 MED ORDER — ONDANSETRON HCL 4 MG PO TABS
4.0000 mg | ORAL_TABLET | Freq: Four times a day (QID) | ORAL | Status: DC | PRN
  Administered 2013-03-21: 4 mg via ORAL
  Filled 2013-03-18: qty 1

## 2013-03-18 MED ORDER — ALUM & MAG HYDROXIDE-SIMETH 200-200-20 MG/5ML PO SUSP: 30.0000 mL | ORAL | Status: DC | PRN

## 2013-03-18 MED ORDER — ACETAMINOPHEN 325 MG PO TABS
650.0000 mg | ORAL_TABLET | Freq: Four times a day (QID) | ORAL | Status: DC | PRN
  Administered 2013-03-19 – 2013-03-20 (×2): 650 mg via ORAL
  Filled 2013-03-18 (×2): qty 2

## 2013-03-18 MED ORDER — LIDOCAINE HCL (CARDIAC) 20 MG/ML IV SOLN
INTRAVENOUS | Status: DC | PRN
  Administered 2013-03-18: 40 mg via INTRAVENOUS

## 2013-03-18 MED ORDER — BISACODYL 5 MG PO TBEC
10.0000 mg | DELAYED_RELEASE_TABLET | Freq: Every day | ORAL | Status: DC
  Administered 2013-03-19 – 2013-03-20 (×2): 10 mg via ORAL
  Filled 2013-03-18 (×2): qty 2

## 2013-03-18 MED ORDER — DEXAMETHASONE SODIUM PHOSPHATE 4 MG/ML IJ SOLN
INTRAMUSCULAR | Status: DC | PRN
  Administered 2013-03-18: 8 mg via INTRAVENOUS

## 2013-03-18 MED ORDER — CELECOXIB 200 MG PO CAPS
200.0000 mg | ORAL_CAPSULE | Freq: Two times a day (BID) | ORAL | Status: DC
  Administered 2013-03-18 – 2013-03-21 (×6): 200 mg via ORAL
  Filled 2013-03-18 (×7): qty 1

## 2013-03-18 MED ORDER — LORATADINE 10 MG PO TABS
10.0000 mg | ORAL_TABLET | Freq: Every day | ORAL | Status: DC
  Administered 2013-03-19 – 2013-03-21 (×3): 10 mg via ORAL
  Filled 2013-03-18 (×3): qty 1

## 2013-03-18 MED ORDER — ACETAMINOPHEN 650 MG RE SUPP: 650.0000 mg | Freq: Four times a day (QID) | RECTAL | Status: DC | PRN

## 2013-03-18 MED ORDER — CALCIUM CARBONATE 600 MG PO TABS: 600.0000 mg | ORAL_TABLET | Freq: Two times a day (BID) | ORAL | Status: DC

## 2013-03-18 MED ORDER — CEFUROXIME SODIUM 1.5 G IJ SOLR
INTRAMUSCULAR | Status: AC
  Filled 2013-03-18: qty 1.5

## 2013-03-18 MED ORDER — METOCLOPRAMIDE HCL 10 MG PO TABS: 5.0000 mg | ORAL_TABLET | Freq: Three times a day (TID) | ORAL | Status: DC | PRN

## 2013-03-18 MED ORDER — HYDROMORPHONE HCL PF 1 MG/ML IJ SOLN
0.5000 mg | INTRAMUSCULAR | Status: AC | PRN
  Administered 2013-03-18 (×2): 0.5 mg via INTRAVENOUS

## 2013-03-18 MED ORDER — FENTANYL CITRATE 0.05 MG/ML IJ SOLN
INTRAMUSCULAR | Status: AC
  Administered 2013-03-18: 50 ug
  Filled 2013-03-18: qty 2

## 2013-03-18 SURGICAL SUPPLY — 65 items
BANDAGE ESMARK 6X9 LF (GAUZE/BANDAGES/DRESSINGS) ×1 IMPLANT
BLADE SAGITTAL 25.0X1.19X90 (BLADE) ×2 IMPLANT
BLADE SAW SGTL 11.0X1.19X90.0M (BLADE) IMPLANT
BLADE SAW SGTL 13.0X1.19X90.0M (BLADE) ×2 IMPLANT
BLADE SURG 10 STRL SS (BLADE) ×4 IMPLANT
BNDG ELASTIC 6X15 VLCR STRL LF (GAUZE/BANDAGES/DRESSINGS) ×2 IMPLANT
BNDG ESMARK 6X9 LF (GAUZE/BANDAGES/DRESSINGS) ×2
BOWL SMART MIX CTS (DISPOSABLE) ×2 IMPLANT
CAPT RP KNEE ×2 IMPLANT
CEMENT HV SMART SET (Cement) ×2 IMPLANT
CLOTH BEACON ORANGE TIMEOUT ST (SAFETY) ×2 IMPLANT
COVER SURGICAL LIGHT HANDLE (MISCELLANEOUS) ×2 IMPLANT
CUFF TOURNIQUET SINGLE 34IN LL (TOURNIQUET CUFF) ×2 IMPLANT
CUFF TOURNIQUET SINGLE 44IN (TOURNIQUET CUFF) IMPLANT
DRAPE EXTREMITY T 121X128X90 (DRAPE) ×2 IMPLANT
DRAPE INCISE IOBAN 66X45 STRL (DRAPES) ×2 IMPLANT
DRAPE PROXIMA HALF (DRAPES) ×2 IMPLANT
DRAPE U-SHAPE 47X51 STRL (DRAPES) ×2 IMPLANT
DRSG ADAPTIC 3X8 NADH LF (GAUZE/BANDAGES/DRESSINGS) ×2 IMPLANT
DRSG PAD ABDOMINAL 8X10 ST (GAUZE/BANDAGES/DRESSINGS) ×4 IMPLANT
DURAPREP 26ML APPLICATOR (WOUND CARE) ×4 IMPLANT
ELECT CAUTERY BLADE 6.4 (BLADE) ×2 IMPLANT
ELECT REM PT RETURN 9FT ADLT (ELECTROSURGICAL) ×2
ELECTRODE REM PT RTRN 9FT ADLT (ELECTROSURGICAL) ×1 IMPLANT
EVACUATOR 1/8 PVC DRAIN (DRAIN) ×2 IMPLANT
FACESHIELD LNG OPTICON STERILE (SAFETY) ×2 IMPLANT
GLOVE BIO SURGEON STRL SZ7 (GLOVE) ×2 IMPLANT
GLOVE BIOGEL PI IND STRL 7.0 (GLOVE) ×1 IMPLANT
GLOVE BIOGEL PI IND STRL 7.5 (GLOVE) ×1 IMPLANT
GLOVE BIOGEL PI INDICATOR 7.0 (GLOVE) ×1
GLOVE BIOGEL PI INDICATOR 7.5 (GLOVE) ×1
GLOVE SS BIOGEL STRL SZ 7.5 (GLOVE) ×1 IMPLANT
GLOVE SUPERSENSE BIOGEL SZ 7.5 (GLOVE) ×1
GOWN PREVENTION PLUS XLARGE (GOWN DISPOSABLE) ×4 IMPLANT
GOWN STRL NON-REIN LRG LVL3 (GOWN DISPOSABLE) ×4 IMPLANT
HANDPIECE INTERPULSE COAX TIP (DISPOSABLE) ×1
HOOD PEEL AWAY FACE SHEILD DIS (HOOD) ×4 IMPLANT
IMMOBILIZER KNEE 22 UNIV (SOFTGOODS) IMPLANT
KIT BASIN OR (CUSTOM PROCEDURE TRAY) ×2 IMPLANT
KIT ROOM TURNOVER OR (KITS) ×2 IMPLANT
MANIFOLD NEPTUNE II (INSTRUMENTS) ×2 IMPLANT
NS IRRIG 1000ML POUR BTL (IV SOLUTION) ×2 IMPLANT
PACK TOTAL JOINT (CUSTOM PROCEDURE TRAY) ×2 IMPLANT
PAD ABD 8X10 STRL (GAUZE/BANDAGES/DRESSINGS) ×2 IMPLANT
PAD ARMBOARD 7.5X6 YLW CONV (MISCELLANEOUS) ×4 IMPLANT
PAD CAST 4YDX4 CTTN HI CHSV (CAST SUPPLIES) ×1 IMPLANT
PADDING CAST COTTON 4X4 STRL (CAST SUPPLIES) ×1
PADDING CAST COTTON 6X4 STRL (CAST SUPPLIES) ×2 IMPLANT
RUBBERBAND STERILE (MISCELLANEOUS) ×2 IMPLANT
SET HNDPC FAN SPRY TIP SCT (DISPOSABLE) ×1 IMPLANT
SPONGE GAUZE 4X4 12PLY (GAUZE/BANDAGES/DRESSINGS) ×2 IMPLANT
SPONGE GAUZE 4X4 12PLY STER LF (GAUZE/BANDAGES/DRESSINGS) ×2 IMPLANT
STRIP CLOSURE SKIN 1/2X4 (GAUZE/BANDAGES/DRESSINGS) ×2 IMPLANT
SUCTION FRAZIER TIP 10 FR DISP (SUCTIONS) ×2 IMPLANT
SUT ETHIBOND NAB CT1 #1 30IN (SUTURE) ×4 IMPLANT
SUT MNCRL AB 3-0 PS2 18 (SUTURE) ×2 IMPLANT
SUT VIC AB 0 CT1 27 (SUTURE) ×2
SUT VIC AB 0 CT1 27XBRD ANBCTR (SUTURE) ×2 IMPLANT
SUT VIC AB 2-0 CT1 27 (SUTURE) ×2
SUT VIC AB 2-0 CT1 TAPERPNT 27 (SUTURE) ×2 IMPLANT
SYR 30ML SLIP (SYRINGE) ×2 IMPLANT
TOWEL OR 17X24 6PK STRL BLUE (TOWEL DISPOSABLE) ×2 IMPLANT
TOWEL OR 17X26 10 PK STRL BLUE (TOWEL DISPOSABLE) ×2 IMPLANT
TRAY FOLEY CATH 16FR SILVER (SET/KITS/TRAYS/PACK) ×2 IMPLANT
WATER STERILE IRR 1000ML POUR (IV SOLUTION) ×4 IMPLANT

## 2013-03-18 NOTE — H&P (View-Only) (Signed)
TOTAL KNEE ADMISSION H&P  Patient is being admitted for left total knee arthroplasty.  Subjective:  Chief Complaint:left knee pain.  HPI: Kristin Kramer, 73 y.o. female, has a history of pain and functional disability in the left knee due to arthritis and has failed non-surgical conservative treatments for greater than 12 weeks to includeNSAID's and/or analgesics, corticosteriod injections, viscosupplementation injections, flexibility and strengthening excercises, supervised PT with diminished ADL's post treatment, use of assistive devices and activity modification.  Onset of symptoms was gradual, starting 10 years ago with gradually worsening course since that time. The patient noted prior procedures on the knee to include  arthroscopy and menisectomy on the left knee(s).  Patient currently rates pain in the left knee(s) at 10 out of 10 with activity. Patient has night pain, worsening of pain with activity and weight bearing, pain that interferes with activities of daily living, crepitus and joint swelling.  Patient has evidence of subchondral sclerosis, periarticular osteophytes and joint space narrowing by imaging studies.  There is no active infection.  Patient Active Problem List   Diagnosis Date Noted  . Complication of anesthesia   . Abnormal finding on EKG   . History of pneumonia   . Post-operative nausea and vomiting   . Refusal of blood transfusions as patient is Jehovah's Witness   . Abdominal  pain, other specified site   . Medicare annual wellness visit, subsequent 01/14/2013  . Hot flashes 01/14/2013  . Transaminitis 01/03/2013  . Abdominal pain 01/03/2013  . Vaginal discharge 01/03/2013  . Chest pain 06/13/2012  . Osteopenia   . HLD (hyperlipidemia)   . Vitamin D deficiency   . Anxiety 01/05/2012  . Chest pain on respiration 01/03/2012  . Seasonal allergies   . Squamous cell skin cancer   . Right knee DJD   . Depression   . Migraines   . Abnormal EKG   . Arthritis     Past Medical History  Diagnosis Date  . Seasonal allergies     cats, dust, mold, roaches  . Depression     prior on lexapro, celexa, wellbutrin.  high cymbalta doses cause tremors  . Migraines   . Squamous cell skin cancer   . Arthritis   . Knee osteoarthritis 2013, 2014    bilateral s/p R TKR, pending L TKR Noemi Chapel)  . Post-operative nausea and vomiting     postop n/v  . Abnormal finding on EKG     seen by Dr. Rockey Situ, cleared by cards for surgery  . History of pneumonia   . Bacterial UTI Ecoli 01/03/2012    Treatment began preoperatively with CIPRO 500mg  BID  . Refusal of blood transfusions as patient is Jehovah's Witness   . Osteopenia   . HLD (hyperlipidemia)   . Vitamin D deficiency   . 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 Tonsillectomy and adenoidectomy  1945  . Abdominal hysterectomy  1980  . Bunionectomy  2013    R and L foot  . Cataract extraction  2013    R, pending L  . Cosmetic surgery  2002    face lift  . Eye surgery      bilateral cataract surgery  . Knee arthroplasty  01/03/2012    right knee  . Total knee arthroplasty  01/02/2012    Procedure: TOTAL KNEE ARTHROPLASTY;  Surgeon: Audree Camel  Noemi Chapel, MD;  Location: [place];  Service: Orthopedics;  Laterality: Right;  right total knee replacement  . Breast enhancement surgery    . Mohs surgery  2011    R leg  . Colonoscopy  2007    no records received  . Dexa  06/2008    T -1.4 spine and hip  . Total knee arthroplasty Right      (Not in a hospital admission) Allergies  Allergen Reactions  . Lipitor [Atorvastatin] Other (See Comments)    Cramps  . Pravastatin Other (See Comments)    Cramps     Current Outpatient Prescriptions on File Prior to Visit  Medication Sig Dispense Refill  . BEPREVE 1.5 % SOLN 1 drop 2 (two) times daily.       . Biotin  1000 MCG tablet Take 2,000 mcg by mouth daily.      . calcium carbonate (OS-CAL) 600 MG TABS Take 600 mg by mouth 2 (two) times daily with a meal.      . cetirizine (ZYRTEC) 10 MG tablet Take 10 mg by mouth daily.      . Cholecalciferol (VITAMIN D-3) 5000 UNITS TABS Take 5,000 Units by mouth daily.      . Coenzyme Q10 (CO Q10) 100 MG CAPS Take 1 capsule by mouth daily.      . DULoxetine (CYMBALTA) 20 MG capsule TAKE ONE CAPSULE BY MOUTH EVERY DAY  90 capsule  1  . LORazepam (ATIVAN) 0.5 MG tablet Take 0.25 mg by mouth at bedtime as needed. For sleep      . mometasone (NASONEX) 50 MCG/ACT nasal spray Place 2 sprays into the nose daily.      . Omega-3 Fatty Acids (FISH OIL) 1200 MG CAPS Take by mouth daily.      . Probiotic Product (PROBIOTIC PO) Take by mouth daily.      . Turmeric 500 MG CAPS Take by mouth daily.      Marland Kitchen acetaminophen (TYLENOL) 325 MG tablet Take 650 mg by mouth every 6 (six) hours as needed. For pain      . Red Yeast Rice Extract (RED YEAST RICE PO) Take 1,200 mg by mouth 2 (two) times daily.       . traMADol (ULTRAM) 50 MG tablet Take 1-2 tablets (50-100 mg total) by mouth every 8 (eight) hours as needed for pain.  40 tablet  0  . zoster vaccine live, PF, (ZOSTAVAX) 40347 UNT/0.65ML injection Inject 19,400 Units into the skin once.  1 each  0   No current facility-administered medications on file prior to visit.   History  Substance Use Topics  . Smoking status: Never Smoker   . Smokeless tobacco: Never Used  . Alcohol Use: Yes     Comment: occasional    Family History  Problem Relation Age of Onset  . Cancer Mother     lung, smoker  . Cancer Father     lung, smoker  . Cancer Maternal Grandmother 63    leukemia  . Coronary artery disease Paternal Grandmother 57    sudden cardiac death  . Diabetes Neg Hx   . Stroke Neg Hx      Review of Systems  Constitutional: Negative.   HENT: Negative.   Eyes: Negative.   Respiratory: Positive for cough. Negative for  hemoptysis, sputum production, shortness of breath and wheezing.   Cardiovascular: Negative.   Gastrointestinal: Negative.   Genitourinary: Negative.   Musculoskeletal: Positive for joint pain.  Left knee pain  Skin: Negative.   Neurological: Negative.   Endo/Heme/Allergies: Negative.   Psychiatric/Behavioral: Negative.     Objective:  Physical Exam  Constitutional: She is oriented to person, place, and time. She appears well-developed and well-nourished.  HENT:  Head: Normocephalic and atraumatic.  Mouth/Throat: Oropharynx is clear and moist.  Eyes: Conjunctivae and EOM are normal. Pupils are equal, round, and reactive to light.  Neck: Normal range of motion. Neck supple.  Cardiovascular: Normal rate, regular rhythm and normal heart sounds.   Respiratory: Effort normal and breath sounds normal.  GI: Soft. Bowel sounds are normal.  Genitourinary:  Not pertinent to current symptomatology therefore not examined.  Musculoskeletal:  Well-developed well-nourished white female in no acute distress. Alert and oriented. Examination of her left knee reveals pain medially and laterally, 1+ crepitation 1+ synovitis, full range of motion knee is stable with normal patella tracking. Exam of the right knee reveals well healed total knee incision without swelling or pain full range of motion knee is stable with normal patella tracking. Vascular exam: pulses 2+ and symmetric.  Neurological: She is alert and oriented to person, place, and time.  Skin: Skin is warm and dry.  Psychiatric: She has a normal mood and affect. Her behavior is normal.    Vital signs in last 24 hours: Last recorded: 12/31 0900   BP: 136/74 Pulse: 70  Temp: 98.3 F (36.8 C)    Height: 5\' 6"  (1.676 m) SpO2: 99  Weight: 69.854 kg (154 lb)     Labs:   Estimated body mass index is 24.87 kg/(m^2) as calculated from the following:   Height as of this encounter: 5\' 6"  (1.676 m).   Weight as of this encounter:  69.854 kg (154 lb).   Imaging Review Plain radiographs demonstrate severe degenerative joint disease of the left knee(s). The overall alignment ismild valgus. The bone quality appears to be good for age and reported activity level.  Assessment/Plan:  End stage arthritis, left knee  Patient Active Problem List   Diagnosis Date Noted  . Complication of anesthesia   . Abnormal finding on EKG   . History of pneumonia   . Post-operative nausea and vomiting   . Refusal of blood transfusions as patient is Jehovah's Witness   . Abdominal  pain, other specified site   . Medicare annual wellness visit, subsequent 01/14/2013  . Hot flashes 01/14/2013  . Transaminitis 01/03/2013  . Abdominal pain 01/03/2013  . Vaginal discharge 01/03/2013  . Chest pain 06/13/2012  . Osteopenia   . HLD (hyperlipidemia)   . Vitamin D deficiency   . Anxiety 01/05/2012  . Chest pain on respiration 01/03/2012  . Seasonal allergies   . Squamous cell skin cancer   . Right knee DJD   . Depression   . Migraines   . Abnormal EKG   . Arthritis     The patient history, physical examination, clinical judgment of the provider and imaging studies are consistent with end stage degenerative joint disease of the left knee(s) and total knee arthroplasty is deemed medically necessary. The treatment options including medical management, injection therapy arthroscopy and arthroplasty were discussed at length. The risks and benefits of total knee arthroplasty were presented and reviewed. The risks due to aseptic loosening, infection, stiffness, patella tracking problems, thromboembolic complications and other imponderables were discussed. The patient acknowledged the explanation, agreed to proceed with the plan and consent was signed. Patient is being admitted for inpatient treatment for surgery, pain control, PT,  OT, prophylactic antibiotics, VTE prophylaxis, progressive ambulation and ADL's and discharge planning. The patient is  planning to be discharged to skilled nursing facility [facility]. Kaleen Mask Physician Assistant [facility] [phone]  03/06/2013, 10:40 AM Naiah Donahoe A. Kaleen Mask Physician Assistant [facility] [phone]  03/06/2013, 10:34 AM

## 2013-03-18 NOTE — Op Note (Signed)
MRN:     [id number] DOB/AGE:    1941-01-09 / 73 y.o.       OPERATIVE REPORT    DATE OF PROCEDURE:  03/18/2013       PREOPERATIVE DIAGNOSIS:   DJD LEFT KNEE      Estimated body mass index is 24.34 kg/(m^2) as calculated from the following:   Height as of 03/11/13: 5\' 6"  (1.676 m).   Weight as of 01/14/13: 68.38 kg (150 lb 12 oz).                                                        POSTOPERATIVE DIAGNOSIS:   osteoarthritis right knee                                                                      PROCEDURE:  Procedure(s): TOTAL KNEE ARTHROPLASTY Using Depuy Sigma RP implants #3 Femur, #3Tibia, 12.1mm sigma RP bearing, 32 Patella     SURGEON: Shilynn Hoch A    ASSISTANT:  Kirstin Shepperson PA-C   (Present and scrubbed throughout the case, critical for assistance with exposure, retraction, instrumentation, and closure.)         ANESTHESIA: GET with Femoral Nerve Block  DRAINS: foley, 2 medium hemovac in knee   TOURNIQUET TIME: 65HQI   COMPLICATIONS:  None     SPECIMENS: None   INDICATIONS FOR PROCEDURE: The patient has  DJD LEFT KNEE, varus deformities, XR shows bone on bone arthritis. Patient has failed all conservative measures including anti-inflammatory medicines, narcotics, attempts at  exercise and weight loss, cortisone injections and viscosupplementation.  Risks and benefits of surgery have been discussed, questions answered.   DESCRIPTION OF PROCEDURE: The patient identified by armband, received  right femoral nerve block and IV antibiotics, in the holding area at [facility]. Patient taken to the operating room, appropriate anesthetic  monitors were attached General endotracheal anesthesia induced with  the patient in supine position, Foley catheter was inserted. Tourniquet  applied high to the operative thigh. Lateral post and foot positioner  applied to the table, the lower extremity was then prepped and draped  in usual sterile fashion from the ankle to  the tourniquet. Time-out procedure was performed. The limb was wrapped with an Esmarch bandage and the tourniquet inflated to 365 mmHg. We began the operation by making the anterior midline incision starting at handbreadth above the patella going over the patella 1 cm medial to and  4 cm distal to the tibial tubercle. Small bleeders in the skin and the  subcutaneous tissue identified and cauterized. Transverse retinaculum was incised and reflected medially and a medial parapatellar arthrotomy was accomplished. the patella was everted and theprepatellar fat pad resected. The superficial medial collateral  ligament was then elevated from anterior to posterior along the proximal  flare of the tibia and anterior half of the menisci resected. The knee was hyperflexed exposing bone on bone arthritis. Peripheral and notch osteophytes as well as the cruciate ligaments were then resected. We continued to  work our way around posteriorly along the proximal tibia, and externally  rotated the tibia subluxing it out from underneath the femur. A McHale  retractor was placed through the notch and a lateral Hohmann retractor  placed, and we then drilled through the proximal tibia in line with the  axis of the tibia followed by an intramedullary guide rod and 2-degree  posterior slope cutting guide. The tibial cutting guide was pinned into place  allowing resection of 4 mm of bone medially and about 6 mm of bone  laterally because of her varus deformity. Satisfied with the tibial resection, we then  entered the distal femur 2 mm anterior to the PCL origin with the  intramedullary guide rod and applied the distal femoral cutting guide  set at 83mm, with 5 degrees of valgus. This was pinned along the  epicondylar axis. At this point, the distal femoral cut was accomplished without difficulty. We then sized for a #3 femoral component and pinned the guide in 3 degrees of external rotation.The chamfer cutting guide was  pinned into place. The anterior, posterior, and chamfer cuts were accomplished without difficulty followed by  the Sigma RP box cutting guide and the box cut. We also removed posterior osteophytes from the posterior femoral condyles. At this  time, the knee was brought into full extension. We checked our  extension and flexion gaps and found them symmetric at 12.37mm.  The patella thickness measured at 23 mm. We set the cutting guide at 15 and removed the posterior 9.5-10 mm  of the patella sized for 32 button and drilled the lollipop. The knee  was then once again hyperflexed exposing the proximal tibia. We sized for a #3 tibial base plate, applied the smokestack and the conical reamer followed by the the Delta fin keel punch. We then hammered into place the Sigma RP trial femoral component, inserted a 12.5-mm trial bearing, trial patellar button, and took the knee through range of motion from 0-130 degrees. No thumb pressure was required for patellar  tracking. At this point, all trial components were removed, a double batch of DePuy HV cement with 1500 mg of Zinacef was mixed and applied to all bony metallic mating surfaces except for the posterior condyles of the femur itself. In order, we  hammered into place the tibial tray and removed excess cement, the femoral component and removed excess cement, a 12.5-mm Sigma RP bearing  was inserted, and the knee brought to full extension with compression.  The patellar button was clamped into place, and excess cement  removed. While the cement cured the wound was irrigated out with normal saline solution pulse lavage, and medium Hemovac drains were placed.. Ligament stability and patellar tracking were checked and found to be excellent. The tourniquet was then released and hemostasis was obtained with cautery. The parapatellar arthrotomy was closed with  #1 ethibond suture. The subcutaneous tissue with 0 and 2-0 undyed  Vicryl suture, and 4-0 Monocryl.. A  dressing of Xeroform,  4 x 4, dressing sponges, Webril, and Ace wrap applied. Needle and sponge count were correct times 2.The patient awakened, extubated, and taken to recovery room without difficulty. Vascular status was normal, pulses 2+ and symmetric.   Mckinleigh Schuchart A 03/18/2013, 12:24 PM

## 2013-03-18 NOTE — Progress Notes (Signed)
Orthopedic Tech Progress Note Patient Details:  Kristin Kramer [birth year] [id number]  CPM Left Knee CPM Left Knee: On Left Knee Flexion (Degrees): 60 Left Knee Extension (Degrees): 0 Additional Comments: trapeze bar patient helper Viewed order from rn order list  [name] 03/18/2013, 2:11 PM

## 2013-03-18 NOTE — Transfer of Care (Signed)
Immediate Anesthesia Transfer of Care Note  Patient: Kristin Kramer  Procedure(s) Performed: Procedure(s): TOTAL KNEE ARTHROPLASTY (Left)  Patient Location: PACU  Anesthesia Type:General  Level of Consciousness: awake, alert  and oriented  Airway & Oxygen Therapy: Patient Spontanous Breathing and Patient connected to nasal cannula oxygen  Post-op Assessment: Report given to PACU RN, Post -op Vital signs reviewed and stable and Patient moving all extremities X 4  Post vital signs: Reviewed and stable  Complications: No apparent anesthesia complications

## 2013-03-18 NOTE — Anesthesia Preprocedure Evaluation (Addendum)
Anesthesia Evaluation  Patient identified by MRN, date of birth, ID band Patient awake    Reviewed: Allergy & Precautions, H&P , NPO status , Patient's Chart, lab work & pertinent test results  History of Anesthesia Complications (+) PONV  Airway Mallampati: II      Dental  (+) Teeth Intact   Pulmonary          Cardiovascular     Neuro/Psych Anxiety Depression    GI/Hepatic   Endo/Other    Renal/GU      Musculoskeletal   Abdominal   Peds  Hematology  (+) REFUSES BLOOD PRODUCTS, JEHOVAH'S WITNESS  Anesthesia Other Findings   Reproductive/Obstetrics                           Anesthesia Physical Anesthesia Plan  ASA: II  Anesthesia Plan: General   Post-op Pain Management:    Induction: Intravenous  Airway Management Planned: Oral ETT  Additional Equipment:   Intra-op Plan:   Post-operative Plan: Extubation in OR  Informed Consent: I have reviewed the patients History and Physical, chart, labs and discussed the procedure including the risks, benefits and alternatives for the proposed anesthesia with the patient or authorized representative who has indicated his/her understanding and acceptance.   Dental advisory given  Plan Discussed with: CRNA and Anesthesiologist  Anesthesia Plan Comments:        Anesthesia Quick Evaluation

## 2013-03-18 NOTE — Progress Notes (Signed)
Utilization review completed.  

## 2013-03-18 NOTE — Progress Notes (Signed)
Orthopedic Tech Progress Note Patient Details:  Kristin Kramer May 19, 1940 [id number]  Patient ID: [name], female   DOB: [birth year], 73 y.o.   MRN: [id number] [place] [address]  [name] 03/18/2013, 2:12 PM

## 2013-03-18 NOTE — Anesthesia Procedure Notes (Addendum)
Anesthesia Regional Block:  Adductor canal block  Pre-Anesthetic Checklist: ,, timeout performed, Correct Patient, Correct Site, Correct Laterality, Correct Procedure, Correct Position, site marked, Risks and benefits discussed,  Surgical consent,  Pre-op evaluation,  At surgeon's request and post-op pain management  Laterality: Left  Prep: chloraprep       Needles:  Injection technique: Single-shot  Needle Type: Echogenic Stimulator Needle      Needle Gauge: 22 and 22 G    Additional Needles:  Procedures: ultrasound guided (picture in chart) Adductor canal block Narrative:  Start time: 03/18/2013 9:10 AM End time: 03/18/2013 9:15 AM Injection made incrementally with aspirations every 5 mL.  Performed by: Personally   Additional Notes: 30 cc 0.5% marcaine with 1:200 Epi and 8 mg decadron injected easily   Procedure Name: LMA Insertion Date/Time: 03/18/2013 11:06 AM Performed by: Rush Farmer E Pre-anesthesia Checklist: Patient identified, Emergency Drugs available, Suction available, Patient being monitored and Timeout performed Patient Re-evaluated:Patient Re-evaluated prior to inductionOxygen Delivery Method: Circle system utilized Preoxygenation: Pre-oxygenation with 100% oxygen Intubation Type: IV induction Ventilation: Mask ventilation with difficulty LMA: LMA inserted LMA Size: 4.0 Number of attempts: 1 Placement Confirmation: positive ETCO2 and breath sounds checked- equal and bilateral Tube secured with: Tape Dental Injury: Teeth and Oropharynx as per pre-operative assessment

## 2013-03-18 NOTE — Anesthesia Postprocedure Evaluation (Signed)
  Anesthesia Post-op Note  Patient: Kristin Kramer  Procedure(s) Performed: Procedure(s): TOTAL KNEE ARTHROPLASTY (Left)  Patient Location: PACU  Anesthesia Type:General and GA combined with regional for post-op pain  Level of Consciousness: awake, alert  and oriented  Airway and Oxygen Therapy: Patient Spontanous Breathing and Patient connected to nasal cannula oxygen  Post-op Pain: mild  Post-op Assessment: Post-op Vital signs reviewed, Patient's Cardiovascular Status Stable, Respiratory Function Stable, Patent Airway and Pain level controlled  Post-op Vital Signs: stable  Complications: No apparent anesthesia complications

## 2013-03-18 NOTE — Interval H&P Note (Signed)
History and Physical Interval Note:  03/18/2013 10:50 AM  Kristin Kramer  has presented today for surgery, with the diagnosis of DJD LEFT KNEE  The various methods of treatment have been discussed with the patient and family. After consideration of risks, benefits and other options for treatment, the patient has consented to  Procedure(s): TOTAL KNEE ARTHROPLASTY (Left) as a surgical intervention .  The patient's history has been reviewed, patient examined, no change in status, stable for surgery.  I have reviewed the patient's chart and labs.  Questions were answered to the patient's satisfaction.     Elsie Saas A

## 2013-03-18 NOTE — Plan of Care (Signed)
Problem: Consults Goal: Diagnosis- Total Joint Replacement Primary Total Knee Left     

## 2013-03-18 NOTE — Preoperative (Signed)
Beta Blockers   Reason not to administer Beta Blockers:Not Applicable 

## 2013-03-19 LAB — BASIC METABOLIC PANEL
BUN: 10 mg/dL (ref 6–23)
CALCIUM: 9.2 mg/dL (ref 8.4–10.5)
CO2: 28 meq/L (ref 19–32)
Chloride: 101 mEq/L (ref 96–112)
Creatinine, Ser: 0.63 mg/dL (ref 0.50–1.10)
GFR calc Af Amer: >90 mL/min (ref >90)
GFR, EST NON AFRICAN AMERICAN: 87 mL/min — AB (ref >90)
GLUCOSE: 164 mg/dL — AB (ref 70–99)
Potassium: 4.8 mEq/L (ref 3.7–5.3)
Sodium: 143 mEq/L (ref 137–147)

## 2013-03-19 LAB — CBC
HEMATOCRIT: 36.6 % (ref 36.0–46.0)
HEMOGLOBIN: 12.3 g/dL (ref 12.0–15.0)
MCH: 29.9 pg (ref 26.0–34.0)
MCHC: 33.6 g/dL (ref 30.0–36.0)
MCV: 89.1 fL (ref 78.0–100.0)
Platelets: 219 10*3/uL (ref 150–400)
RBC: 4.11 MIL/uL (ref 3.87–5.11)
RDW: 12.9 % (ref 11.5–15.5)
WBC: 9.5 10*3/uL (ref 4.0–10.5)

## 2013-03-19 NOTE — Progress Notes (Signed)
Subjective: 1 Day Post-Op Procedure(s) (LRB): TOTAL KNEE ARTHROPLASTY (Left) Patient reports pain as 4 on 0-10 scale.    Objective: Vital signs in last 24 hours: Temp:  [97.1 F (36.2 C)-98.8 F (37.1 C)] 98.8 F (37.1 C) (01/13 0429) Pulse Rate:  [71-99] 76 (01/13 0429) Resp:  [10-23] 18 (01/13 0429) BP: (103-137)/(51-78) 112/55 mmHg (01/13 0429) SpO2:  [90 %-100 %] 98 % (01/13 0429)  Intake/Output from previous day: 01/12 0701 - 01/13 0700 In: 3070 [P.O.:120; I.V.:2600] Out: 3000 [Urine:2850; Drains:100; Blood:50] Intake/Output this shift:     Recent Labs  03/19/13 0415  HGB 12.3    Recent Labs  03/19/13 0415  WBC 9.5  RBC 4.11  HCT 36.6  PLT 219    Recent Labs  03/19/13 0415  NA 143  K 4.8  CL 101  CO2 28  BUN 10  CREATININE 0.63  GLUCOSE 164*  CALCIUM 9.2   No results found for this basename: LABPT, INR,  in the last 72 hours  Neurologically intact ABD soft Neurovascular intact Sensation intact distally Intact pulses distally Dorsiflexion/Plantar flexion intact Incision: scant drainage  Assessment/Plan: 1 Day Post-Op Procedure(s) (LRB): TOTAL KNEE ARTHROPLASTY (Left) Principal Problem:   Left knee DJD Active Problems:   Depression   Migraines   Abnormal EKG   Arthritis   Right knee DJD   Anxiety   Vitamin D deficiency   Complication of anesthesia   Post-operative nausea and vomiting   Refusal of blood transfusions as patient is Jehovah's Witness   DJD (degenerative joint disease) of knee  Advance diet Up with therapy Discharge to SNF on Thursday  Kristin Kramer J 03/19/2013, 8:26 AM

## 2013-03-19 NOTE — Care Management Note (Signed)
CARE MANAGEMENT NOTE 03/19/2013  Patient:  [facility]   Account Number:  [id number]  Date Initiated:  03/19/2013  Documentation initiated by:  Ricki Miller  Subjective/Objective Assessment:   73 yr old female s/p left total knee arthroplasty.     Action/Plan:   Patient is for shortterm rehab, plans to go to [facility]. Social worker is aware.   Anticipated DC Date:  03/21/2013   Anticipated DC Plan:  SKILLED NURSING FACILITY  In-house referral  Clinical Social Worker      DC Planning Services  CM consult      Choice offered to / List presented to:             Status of service:  Completed, signed off Medicare Important Message given?   (If response is "NO", the following Medicare IM given date fields will be blank) Date Medicare IM given:   Date Additional Medicare IM given:    Discharge Disposition:  [place]

## 2013-03-19 NOTE — Evaluation (Signed)
Physical Therapy Evaluation Patient Details Name: Kristin Kramer MRN: [id number] DOB: 11/22/1940 Today's Date: 03/19/2013 Time: 5053-9767 PT Time Calculation (min): 16 min  PT Assessment / Plan / Recommendation History of Present Illness  S/p LTKA  Clinical Impression  Pt is s/p TKA resulting in the deficits listed below (see PT Problem List).  Pt will benefit from skilled PT to increase their independence and safety with mobility to allow discharge to the venue listed below.      PT Assessment  Patient needs continued PT services    Follow Up Recommendations  SNF    Does the patient have the potential to tolerate intense rehabilitation      Barriers to Discharge        Equipment Recommendations  None recommended by PT    Recommendations for Other Services     Frequency 7X/week    Precautions / Restrictions Precautions Precautions: Knee Required Braces or Orthoses: Knee Immobilizer - Left Knee Immobilizer - Left: Other (comment) (Until dc'd) Restrictions Weight Bearing Restrictions: Yes LLE Weight Bearing: Weight bearing as tolerated   Pertinent Vitals/Pain 5/10 knee pain with therex Was premedicated for pain      Mobility  Transfers Overall transfer level: Needs assistance Equipment used: Rolling walker (2 wheeled) Transfers: Sit to/from Stand Sit to Stand: Min guard General transfer comment: Cues for safety and hand placement Ambulation/Gait Ambulation/Gait assistance: Min guard Ambulation Distance (Feet): 60 Feet Assistive device: Rolling walker (2 wheeled) Gait Pattern/deviations: Step-to pattern General Gait Details: Cues for gait sequence and to activate L quad for stance stability    Exercises Total Joint Exercises Quad Sets: AROM;Left;5 reps Knee Flexion: AROM;Left;Seated   PT Diagnosis: Difficulty walking;Acute pain  PT Problem List: Decreased strength;Decreased range of motion;Decreased activity tolerance;Decreased mobility;Decreased knowledge of  use of DME;Decreased knowledge of precautions;Pain PT Treatment Interventions: DME instruction;Gait training;Stair training;Functional mobility training;Therapeutic activities;Therapeutic exercise;Balance training;Patient/family education     PT Goals(Current goals can be found in the care plan section) Acute Rehab PT Goals Patient Stated Goal: back to golf; wants to be able to clean her tub PT Goal Formulation: With patient Time For Goal Achievement: 03/26/13 Potential to Achieve Goals: Good  Visit Information  Last PT Received On: 03/19/13 Assistance Needed: +1 History of Present Illness: S/p LTKA       Prior [place] expects to be discharged to:: Skilled nursing facility Living Arrangements: Alone Prior Function Level of Independence: Independent Communication Communication: No difficulties    Cognition  Cognition Arousal/Alertness: Awake/alert Behavior During Therapy: WFL for tasks assessed/performed Overall Cognitive Status: Within Functional Limits for tasks assessed    Extremity/Trunk Assessment Upper Extremity Assessment Upper Extremity Assessment: Overall WFL for tasks assessed Lower Extremity Assessment Lower Extremity Assessment: LLE deficits/detail LLE Deficits / Details: Decr AROM and strength, limited by pain postop   Balance    End of Session PT - End of Session Equipment Utilized During Treatment: Gait belt Activity Tolerance: Patient tolerated treatment well Patient left: in chair;with call bell/phone within reach;with family/visitor present Nurse Communication: Mobility status  GP     Kristin Kramer, Kristin Kramer  03/19/2013, 1:09 PM

## 2013-03-19 NOTE — Progress Notes (Signed)
Physical Therapy Treatment Note  More pain this pm limiting activity tolerance, but overall moving quite well, with good L knee stance stability  5/10 L knee pain; was premedicated for pain patient repositioned for comfort in CPM   03/19/13 1600  PT Visit Information  Last PT Received On 03/19/13  Assistance Needed +1  History of Present Illness S/p LTKA  PT Time Calculation  PT Start Time 1558  PT Stop Time 1622  PT Time Calculation (min) 24 min  Subjective Data  Patient Stated Goal back to golf; wants to be able to clean her tub  Precautions  Precautions Knee  Required Braces or Orthoses Knee Immobilizer - Left  Knee Immobilizer - Left Other (comment) (Until dc'd)  Restrictions  Weight Bearing Restrictions Yes  LLE Weight Bearing WBAT  Cognition  Arousal/Alertness Awake/alert  Behavior During Therapy WFL for tasks assessed/performed  Overall Cognitive Status Within Functional Limits for tasks assessed  Bed Mobility  Overal bed mobility Needs Assistance  Bed Mobility Supine to Sit;Sit to Supine  Supine to sit Min guard  Sit to supine Min guard  General bed mobility comments Cues for technique  Transfers  Overall transfer level Needs assistance  Equipment used Rolling walker (2 wheeled)  Transfers Sit to/from Stand  Sit to Stand Min guard  General transfer comment Cues for safety and hand placement  Ambulation/Gait  Ambulation/Gait assistance Min guard  Ambulation Distance (Feet) 50 Feet  Assistive device Rolling walker (2 wheeled)  Gait Pattern/deviations Step-to pattern  General Gait Details Cues for gait sequence and to activate L quad for stance stability; No noted buckling  PT - End of Session  Equipment Utilized During Treatment Gait belt  Activity Tolerance Patient tolerated treatment well  Patient left in chair;with call bell/phone within reach;with family/visitor present  Nurse Communication Mobility status  PT - Assessment/Plan  PT Plan Current plan  remains appropriate  PT Frequency 7X/week  Follow Up Recommendations SNF  PT equipment None recommended by PT  PT Goal Progression  Progress towards PT goals Progressing toward goals  Acute Rehab PT Goals  PT Goal Formulation With patient  Time For Goal Achievement 03/26/13  Potential to Achieve Goals Good  PT General Charges  $$ ACUTE PT VISIT 1 Procedure  PT Treatments  $Gait Training 8-22 mins  $Therapeutic Activity 8-22 mins   [place], [place]

## 2013-03-19 NOTE — Progress Notes (Signed)
Clinical Social Work Department BRIEF PSYCHOSOCIAL ASSESSMENT 03/19/2013  Patient:  [facility]     Account Number:  [id number]     Admit date:  03/18/2013  Clinical Social Worker:  Adair Laundry  Date/Time:  03/19/2013 11:00 AM  Referred by:  Physician  Date Referred:  03/19/2013 Referred for  SNF Placement   Other Referral:   Interview type:  Patient Other interview type:    PSYCHOSOCIAL DATA Living Status:  ALONE Admitted from facility:   Level of care:   Primary support name:  Kennis Carina [phone] Primary support relationship to patient:  SIBLING Degree of support available:   Pt has good but limite amount of support    CURRENT CONCERNS Current Concerns  Post-Acute Placement   Other Concerns:    SOCIAL WORK ASSESSMENT / PLAN CSW informed that pt has already made arrangements to dc to [facility]. CSW spoke with pt and confirmed this was the plan. CSW also explained role and process of referral for SNF. Pt was understanding. Pt reported she was at [facility] a year ago and while she is not looking forward to having to return she knows that it is what she has to do. Pt says she understands she would not be able to manage without rehab first.   Assessment/plan status:  Psychosocial Support/Ongoing Assessment of Needs Other assessment/ plan:   Information/referral to community resources:   SNF list denied    PATIENT'S/FAMILY'S RESPONSE TO PLAN OF CARE: Pt is agreeable to SNF at [facility] [place]

## 2013-03-19 NOTE — Progress Notes (Addendum)
Clinical Social Work Department CLINICAL SOCIAL WORK PLACEMENT NOTE 03/19/2013  Patient:  CHRYSTEL, BAREFIELD  Account Number:  [id number] [place] date:  03/18/2013  Clinical Social Worker:  Berton Mount, Latanya Presser  Date/time:  03/19/2013 11:20 AM  Clinical Social Work is seeking post-discharge placement for this patient at the following level of care:   SKILLED NURSING   (*CSW will update this form in Epic as items are completed)   03/19/2013  Patient/family provided with [place] Department of Clinical Social Work's list of facilities offering this level of care within the geographic area requested by the patient (or if unable, by the patient's family).  03/19/2013  Patient/family informed of their freedom to choose among providers that offer the needed level of care, that participate in Medicare, Medicaid or managed care program needed by the patient, have an available bed and are willing to accept the patient.  03/19/2013  Patient/family informed of [facility]' ownership interest in [facility], as well as of the fact that they are under no obligation to receive care at this facility.  PASARR submitted to EDS on EXISTING PASARR number received from EDS on   FL2 transmitted to all facilities in geographic area requested by pt/family on  03/19/2013 FL2 transmitted to all facilities within larger geographic area on   Patient informed that his/her managed care company has contracts with or will negotiate with  certain facilities, including the following:     Patient/family informed of bed offers received:  03/20/2013 Patient chooses bed at [facility] Physician recommends and patient chooses bed at    Patient to be transferred to [facility] on  03/20/2013 Patient to be transferred to facility by [facility]  The following physician request were entered in Epic:   Additional Comments:

## 2013-03-19 NOTE — Progress Notes (Signed)
Orthopedic Tech Progress Note Patient Details:  Kristin Kramer [birth year] [id number]  Patient ID: Kristin Kramer, female   DOB: 02-04-1941, 73 y.o.   MRN: [id number] Placed pt's lle in cpm @ 0-50 degrees; will increase as pt tolerates; rn notified  [name] 03/19/2013, 3:09 PM

## 2013-03-20 LAB — CBC
HCT: 31 % — ABNORMAL LOW (ref 36.0–46.0)
HEMOGLOBIN: 10.3 g/dL — AB (ref 12.0–15.0)
MCH: 30 pg (ref 26.0–34.0)
MCHC: 33.2 g/dL (ref 30.0–36.0)
MCV: 90.4 fL (ref 78.0–100.0)
PLATELETS: 195 10*3/uL (ref 150–400)
RBC: 3.43 MIL/uL — ABNORMAL LOW (ref 3.87–5.11)
RDW: 13.2 % (ref 11.5–15.5)
WBC: 8.3 10*3/uL (ref 4.0–10.5)

## 2013-03-20 LAB — BASIC METABOLIC PANEL
BUN: 13 mg/dL (ref 6–23)
CO2: 28 meq/L (ref 19–32)
Calcium: 8.9 mg/dL (ref 8.4–10.5)
Chloride: 106 mEq/L (ref 96–112)
Creatinine, Ser: 0.66 mg/dL (ref 0.50–1.10)
GFR calc Af Amer: >90 mL/min (ref >90)
GFR calc non Af Amer: 86 mL/min — ABNORMAL LOW (ref >90)
GLUCOSE: 103 mg/dL — AB (ref 70–99)
Potassium: 4.3 mEq/L (ref 3.7–5.3)
Sodium: 144 mEq/L (ref 137–147)

## 2013-03-20 NOTE — Progress Notes (Signed)
PT Cancellation Note  Patient Details Name: Kristin Kramer MRN: [id number] DOB: [birth year]   Cancelled Treatment:    Reason Eval/Treat Not Completed: Pain limiting ability to participate . Patient declining a second session of PT and ambulation this afternoon due to increased pain. Will follow up in the morning.   THanks 03/20/2013 Kristin Kramer PTA [phone] pager [phone] office      Kristin Kramer 03/20/2013, 2:35 PM

## 2013-03-20 NOTE — Evaluation (Signed)
Occupational Therapy Evaluation Patient Details Name: Kristin Kramer MRN: [id number] DOB: 1940/09/08 Today's Date: 03/20/2013 Time: 4098-1191 OT Time Calculation (min): 23 min  OT Assessment / Plan / Recommendation History of present illness S/p LTKA   Clinical Impression   Pt is a 73 y/o female s/p L TKA. Pt lives alone and plans to d/c to SNF for short term Rehab. Recommend supervision for transfers, pt verbalized understanding. She plans to d/c tomorrow to [facility] where all further OT needs can be addressed. Will sign off acute OT at this time.    OT Assessment  All further OT needs can be met in the next venue of care    Follow Up Recommendations  SNF    Barriers to Discharge      Equipment Recommendations  None recommended by OT;Other (comment) (Pt states she has DME. )    Recommendations for Other Services    Frequency       Precautions / Restrictions Precautions Precautions: Knee Restrictions Weight Bearing Restrictions: Yes LLE Weight Bearing: Weight bearing as tolerated   Pertinent Vitals/Pain Pt reports 4/10 L knee pain. RN aware and going to give pain medication as able. Pt repositioned, resting at conclusion of treatment session.    ADL  Eating/Feeding: Performed;Independent Where Assessed - Eating/Feeding: Chair Grooming: Performed;Wash/dry hands;Wash/dry face;Supervision/safety Where Assessed - Grooming: Unsupported standing Upper Body Bathing: Simulated;Set up;Supervision/safety Where Assessed - Upper Body Bathing: Supported sitting Lower Body Bathing: Simulated;Supervision/safety;Min guard Where Assessed - Lower Body Bathing: Supported sit to stand Upper Body Dressing: Simulated;Set up Where Assessed - Upper Body Dressing: Supported sitting Lower Body Dressing: Performed;Supervision/safety;Set up (Pt crossed L LE over right to don/doff socks) Where Assessed - Lower Body Dressing: Supported sit to stand Toilet Transfer:  Chartered loss adjuster Method: Sit to Loss adjuster, chartered: Comfort height toilet;Grab bars Toileting - Water quality scientist and Hygiene: Performed;Supervision/safety Where Assessed - Best boy and Hygiene: Standing Tub/Shower Transfer Method: Not assessed Equipment Used: Rolling walker Transfers/Ambulation Related to ADLs: Pt was overall supervision level for functional transfers using RW. ADL Comments: Pt was educated in role of OT. Discussed ADL's, homemaking (LE bathing, a/e - long handled sponge) and cleaning techniques for washing tub etc... Pt participated in ADL retraining session w/ focus on toilet transfers and LE dressing seated. Pt plans to go to SNF at Rehab prior to home secondary to she lives alone.     OT Diagnosis: Generalized weakness;Acute pain  OT Problem List: Pain;Decreased knowledge of use of DME or AE;Decreased activity tolerance;Decreased strength OT Treatment Interventions:     OT Goals(Current goals can be found in the care plan section) Acute Rehab OT Goals Patient Stated Goal: [place] place, then home in about a week.  Visit Information  Last OT Received On: 03/20/13 Assistance Needed: +1 History of Present Illness: S/p LTKA       Prior [place] expects to be discharged to:: Skilled nursing facility Living Arrangements: Alone Additional Comments: Plans to d/c to SNF as she lives alone. Prior Function Level of Independence: Independent Communication Communication: No difficulties Dominant Hand: Right    Vision/Perception Vision - Assessment Vision Assessment: Vision not tested   Cognition  Cognition Arousal/Alertness: Awake/alert Behavior During Therapy: WFL for tasks assessed/performed Overall Cognitive Status: Within Functional Limits for tasks assessed    Extremity/Trunk Assessment Upper Extremity Assessment Upper Extremity Assessment: Overall WFL for  tasks assessed Lower Extremity Assessment Lower Extremity Assessment: Defer to PT evaluation  Mobility Bed Mobility General bed mobility comments: NT - pt up in chair upon OT arrival Transfers Overall transfer level: Needs assistance Equipment used: Rolling walker (2 wheeled) Transfers: Sit to/from Stand Sit to Stand: Supervision       Balance Balance Overall balance assessment: No apparent balance deficits (not formally assessed)   End of Session OT - End of Session Equipment Utilized During Treatment: Rolling walker Activity Tolerance: Patient tolerated treatment well Patient left: in chair;with call bell/phone within reach;with nursing/sitter in room Nurse Communication: Mobility status CPM Left Knee CPM Left Knee: Off  GO     Josephine Igo [place] 03/20/2013, 11:22 AM

## 2013-03-20 NOTE — Progress Notes (Signed)
Physical Therapy Treatment Patient Details Name: Kristin Kramer MRN: [id number] DOB: Dec 14, 1940 Today's Date: 03/20/2013 Time: 5809-9833 PT Time Calculation (min): 24 min  PT Assessment / Plan / Recommendation  History of Present Illness S/p LTKA   PT Comments   Continuing excellent progress despite more pain today; Will do well at postacute rehab  Follow Up Recommendations  SNF     Does the patient have the potential to tolerate intense rehabilitation     Barriers to Discharge        Equipment Recommendations  None recommended by PT    Recommendations for Other Services    Frequency 7X/week   Progress towards PT Goals Progress towards PT goals: Progressing toward goals  Plan Current plan remains appropriate    Precautions / Restrictions Precautions Precautions: Knee Required Braces or Orthoses: Knee Immobilizer - Left Restrictions Weight Bearing Restrictions: Yes LLE Weight Bearing: Weight bearing as tolerated   Pertinent Vitals/Pain 10+/10 with pushing knee flexion;  patient repositioned for comfort and optimal knee ext    Mobility  Bed Mobility Overal bed mobility: Needs Assistance Bed Mobility: Supine to Sit;Sit to Supine Supine to sit: Supervision Sit to supine: Supervision General bed mobility comments: Cues for technique and position in bed Transfers Overall transfer level: Needs assistance Equipment used: Rolling walker (2 wheeled) Transfers: Sit to/from Stand Sit to Stand: Supervision General transfer comment: Less need for cues Ambulation/Gait Ambulation/Gait assistance: Min guard;Supervision Ambulation Distance (Feet): 100 Feet Assistive device: Rolling walker (2 wheeled) General Gait Details: Minguard assist progressing to Supervision; cues for safety, L quad activation for stance stability    Exercises Total Joint Exercises Quad Sets: AROM;Left;10 reps Short Arc Quad: AROM;Left;10 reps Heel Slides: AROM;Left;10 reps (Painful) Straight Leg Raises:  AROM;Left;10 reps Goniometric ROM: 0-90   PT Diagnosis:    PT Problem List:   PT Treatment Interventions:     PT Goals (current goals can now be found in the care plan section) Acute Rehab PT Goals Patient Stated Goal: [name], then home in about a week. PT Goal Formulation: With patient Time For Goal Achievement: 03/26/13 Potential to Achieve Goals: Good  Visit Information  Last PT Received On: 03/20/13 Assistance Needed: +1 History of Present Illness: S/p LTKA    Subjective Data  Subjective: More pain today, but still quite willing to work Patient Stated Goal: [name] place, then home in about a week.   Cognition  Cognition Arousal/Alertness: Awake/alert Behavior During Therapy: WFL for tasks assessed/performed Overall Cognitive Status: Within Functional Limits for tasks assessed    Balance  Balance Overall balance assessment: No apparent balance deficits (not formally assessed)  End of Session PT - End of Session Equipment Utilized During Treatment: Gait belt Activity Tolerance: Patient tolerated treatment well Patient left: in chair;with call bell/phone within reach (preparing for lunch) Nurse Communication: Mobility status CPM Left Knee CPM Left Knee: Off   GP     Roney Marion [place], [place]  03/20/2013, 1:10 PM

## 2013-03-20 NOTE — Progress Notes (Signed)
Subjective: 2 Days Post-Op Procedure(s) (LRB): TOTAL KNEE ARTHROPLASTY (Left) Patient reports pain as 4 on 0-10 scale.    Objective: Vital signs in last 24 hours: Temp:  [98.2 F (36.8 C)-99.2 F (37.3 C)] 98.3 F (36.8 C) (01/14 0648) Pulse Rate:  [64-87] 64 (01/14 0648) Resp:  [16] 16 (01/14 0800) BP: (105-125)/(54-67) 109/67 mmHg (01/14 0648) SpO2:  [96 %-98 %] 98 % (01/14 0800)  Intake/Output from previous day: 01/13 0701 - 01/14 0700 In: 3463.3 [P.O.:1310; I.V.:2153.3] Out: -  Intake/Output this shift: Total I/O In: 233.3 [P.O.:120; I.V.:113.3] Out: -    Recent Labs  03/19/13 0415 03/20/13 0540  HGB 12.3 10.3*    Recent Labs  03/19/13 0415 03/20/13 0540  WBC 9.5 8.3  RBC 4.11 3.43*  HCT 36.6 31.0*  PLT 219 195    Recent Labs  03/19/13 0415 03/20/13 0540  NA 143 144  K 4.8 4.3  CL 101 106  CO2 28 28  BUN 10 13  CREATININE 0.63 0.66  GLUCOSE 164* 103*  CALCIUM 9.2 8.9   No results found for this basename: LABPT, INR,  in the last 72 hours  Neurologically intact ABD soft Neurovascular intact Sensation intact distally Intact pulses distally Dorsiflexion/Plantar flexion intact Incision: scant drainage  Assessment/Plan: 2 Days Post-Op Procedure(s) (LRB): TOTAL KNEE ARTHROPLASTY (Left) Advance diet Up with therapy Discharge to SNF tomorrow to [facility] 03/20/2013, 10:37 AM

## 2013-03-21 ENCOUNTER — Encounter (HOSPITAL_COMMUNITY): Payer: Self-pay | Admitting: Orthopedic Surgery

## 2013-03-21 DIAGNOSIS — M6281 Muscle weakness (generalized): Secondary | ICD-10-CM | POA: Diagnosis not present

## 2013-03-21 DIAGNOSIS — D62 Acute posthemorrhagic anemia: Secondary | ICD-10-CM | POA: Diagnosis not present

## 2013-03-21 DIAGNOSIS — M1710 Unilateral primary osteoarthritis, unspecified knee: Secondary | ICD-10-CM | POA: Diagnosis not present

## 2013-03-21 DIAGNOSIS — K5900 Constipation, unspecified: Secondary | ICD-10-CM | POA: Diagnosis not present

## 2013-03-21 DIAGNOSIS — S99919A Unspecified injury of unspecified ankle, initial encounter: Secondary | ICD-10-CM | POA: Diagnosis not present

## 2013-03-21 DIAGNOSIS — J309 Allergic rhinitis, unspecified: Secondary | ICD-10-CM | POA: Diagnosis not present

## 2013-03-21 DIAGNOSIS — M25569 Pain in unspecified knee: Secondary | ICD-10-CM | POA: Diagnosis not present

## 2013-03-21 DIAGNOSIS — F329 Major depressive disorder, single episode, unspecified: Secondary | ICD-10-CM | POA: Diagnosis not present

## 2013-03-21 DIAGNOSIS — Z471 Aftercare following joint replacement surgery: Secondary | ICD-10-CM | POA: Diagnosis not present

## 2013-03-21 DIAGNOSIS — G43109 Migraine with aura, not intractable, without status migrainosus: Secondary | ICD-10-CM | POA: Diagnosis not present

## 2013-03-21 DIAGNOSIS — F411 Generalized anxiety disorder: Secondary | ICD-10-CM | POA: Diagnosis not present

## 2013-03-21 DIAGNOSIS — Z96659 Presence of unspecified artificial knee joint: Secondary | ICD-10-CM | POA: Diagnosis not present

## 2013-03-21 DIAGNOSIS — R279 Unspecified lack of coordination: Secondary | ICD-10-CM | POA: Diagnosis not present

## 2013-03-21 DIAGNOSIS — IMO0002 Reserved for concepts with insufficient information to code with codable children: Secondary | ICD-10-CM | POA: Diagnosis not present

## 2013-03-21 DIAGNOSIS — R262 Difficulty in walking, not elsewhere classified: Secondary | ICD-10-CM | POA: Diagnosis not present

## 2013-03-21 DIAGNOSIS — F3289 Other specified depressive episodes: Secondary | ICD-10-CM | POA: Diagnosis not present

## 2013-03-21 DIAGNOSIS — M1990 Unspecified osteoarthritis, unspecified site: Secondary | ICD-10-CM | POA: Diagnosis not present

## 2013-03-21 DIAGNOSIS — M899 Disorder of bone, unspecified: Secondary | ICD-10-CM | POA: Diagnosis not present

## 2013-03-21 DIAGNOSIS — Z4789 Encounter for other orthopedic aftercare: Secondary | ICD-10-CM | POA: Diagnosis not present

## 2013-03-21 DIAGNOSIS — S8990XA Unspecified injury of unspecified lower leg, initial encounter: Secondary | ICD-10-CM | POA: Diagnosis not present

## 2013-03-21 LAB — BASIC METABOLIC PANEL
BUN: 11 mg/dL (ref 6–23)
CHLORIDE: 103 meq/L (ref 96–112)
CO2: 31 meq/L (ref 19–32)
Calcium: 8.8 mg/dL (ref 8.4–10.5)
Creatinine, Ser: 0.63 mg/dL (ref 0.50–1.10)
GFR calc Af Amer: >90 mL/min (ref >90)
GFR calc non Af Amer: 87 mL/min — ABNORMAL LOW (ref >90)
GLUCOSE: 104 mg/dL — AB (ref 70–99)
POTASSIUM: 3.9 meq/L (ref 3.7–5.3)
Sodium: 142 mEq/L (ref 137–147)

## 2013-03-21 LAB — CBC
HEMATOCRIT: 32.8 % — AB (ref 36.0–46.0)
Hemoglobin: 10.6 g/dL — ABNORMAL LOW (ref 12.0–15.0)
MCH: 29.4 pg (ref 26.0–34.0)
MCHC: 32.3 g/dL (ref 30.0–36.0)
MCV: 90.9 fL (ref 78.0–100.0)
Platelets: 191 10*3/uL (ref 150–400)
RBC: 3.61 MIL/uL — ABNORMAL LOW (ref 3.87–5.11)
RDW: 13.1 % (ref 11.5–15.5)
WBC: 7.6 10*3/uL (ref 4.0–10.5)

## 2013-03-21 MED ORDER — OXYCODONE HCL 5 MG PO TABS: ORAL_TABLET | ORAL | Status: DC

## 2013-03-21 MED ORDER — ASPIRIN 325 MG PO TBEC: DELAYED_RELEASE_TABLET | ORAL | Status: DC

## 2013-03-21 MED ORDER — BISACODYL 5 MG PO TBEC: DELAYED_RELEASE_TABLET | ORAL | Status: DC

## 2013-03-21 MED ORDER — CELECOXIB 200 MG PO CAPS: ORAL_CAPSULE | ORAL | Status: DC

## 2013-03-21 MED ORDER — DSS 100 MG PO CAPS: ORAL_CAPSULE | ORAL | Status: DC

## 2013-03-21 NOTE — Discharge Planning (Signed)
Report called to Liz, RN at [facility]. 

## 2013-03-21 NOTE — Discharge Summary (Signed)
Patient ID: Bena Kobel MRN: [id number] DOB/AGE: [birth year] 73 y.o.  Admit date: 03/18/2013 Discharge date: 03/21/2013  Admission Diagnoses:  Principal Problem:   Left knee DJD Active Problems:   Depression   Migraines   Abnormal EKG   Arthritis   Right knee DJD   Anxiety   Vitamin D deficiency   Complication of anesthesia   Post-operative nausea and vomiting   Refusal of blood transfusions as patient is Jehovah's Witness   DJD (degenerative joint disease) of knee   Discharge Diagnoses:  Same  Past Medical History  Diagnosis Date  . Seasonal allergies     cats, dust, mold, roaches  . Depression     prior on lexapro, celexa, wellbutrin.  high cymbalta doses cause tremors  . Migraines   . Squamous cell skin cancer   . Arthritis   . Knee osteoarthritis 2013, 2014    bilateral s/p R TKR, pending L TKR [facility])  . Post-operative nausea and vomiting     postop n/v  . Abnormal finding on EKG     seen by Dr. Rockey Situ, cleared by cards for surgery  . History of pneumonia   . Bacterial UTI Ecoli 01/03/2012    Treatment began preoperatively with CIPRO 500mg  BID  . Refusal of blood transfusions as patient is Jehovah's Witness   . Osteopenia   . HLD (hyperlipidemia)   . Vitamin D deficiency   . Abdominal pain, other specified site 10.2014    [facility] - mesenteric adenitis vs sclerosing mesenteritis vs nonspecific lymphadenitis  . Pneumonia     hx of  . H/O seasonal allergies     Surgeries: Procedure(s): TOTAL KNEE ARTHROPLASTY on 03/18/2013   Consultants:    Discharged Condition: Improved  Hospital Course: Marrah Vanevery is an 73 y.o. female who was admitted 03/18/2013 for operative treatment ofLeft knee DJD. Patient has severe unremitting pain that affects sleep, daily activities, and work/hobbies. After pre-op clearance the patient was taken to the operating room on 03/18/2013 and underwent  Procedure(s): TOTAL KNEE ARTHROPLASTY.    Patient was given perioperative  antibiotics: Anti-infectives   Start     Dose/Rate Route Frequency Ordered Stop   03/18/13 1700  ceFAZolin (ANCEF) IVPB 1 g/50 mL premix     1 g 100 mL/hr over 30 Minutes Intravenous Every 6 hours 03/18/13 1546 03/18/13 2255   03/18/13 1154  cefUROXime (ZINACEF) injection  Status:  Discontinued       As needed 03/18/13 1154 03/18/13 1255   03/18/13 0600  ceFAZolin (ANCEF) IVPB 2 g/50 mL premix     2 g 100 mL/hr over 30 Minutes Intravenous On call to O.R. 03/17/13 1315 03/18/13 1110       Patient was given sequential compression devices, early ambulation, and chemoprophylaxis to prevent DVT.  Patient benefited maximally from hospital stay and there were no complications.    Recent vital signs: Patient Vitals for the past 24 hrs:  BP Temp Temp src Pulse Resp SpO2  03/21/13 0510 131/66 mmHg 98.5 F (36.9 C) Oral 75 18 99 %  03/20/13 2008 117/63 mmHg 98.3 F (36.8 C) Oral 79 18 98 %  03/20/13 1600 - - - - 16 97 %  03/20/13 1424 127/82 mmHg 97.4 F (36.3 C) - 84 16 96 %  03/20/13 1115 - - - - 16 99 %     Recent laboratory studies:  Recent Labs  03/20/13 0540 03/21/13 0234  WBC 8.3 7.6  HGB 10.3* 10.6*  HCT 31.0* 32.8*  PLT 195 191  NA 144 142  K 4.3 3.9  CL 106 103  CO2 28 31  BUN 13 11  CREATININE 0.66 0.63  GLUCOSE 103* 104*  CALCIUM 8.9 8.8     Discharge Medications:     Medication List    STOP taking these medications       Co Q10 100 MG Caps     HYDROcodone-acetaminophen 5-325 MG per tablet  Commonly known as:  NORCO/VICODIN      TAKE these medications       acetaminophen 325 MG tablet  Commonly known as:  TYLENOL  Take 650 mg by mouth every 6 (six) hours as needed. For pain     aspirin 325 MG EC tablet  1 tab a day for the next 30 days to prevent blood clots     bisacodyl 5 MG EC tablet  Commonly known as:  DULCOLAX  Take 2 tablets every night with dinner until bowel movement.  LAXITIVE.  Restart if two days since last bowel movement      calcium carbonate 600 MG Tabs tablet  Commonly known as:  OS-CAL  Take 600 mg by mouth 2 (two) times daily with a meal.     celecoxib 200 MG capsule  Commonly known as:  CELEBREX  1 tab po q day with food for pain and  swelling     cetirizine 10 MG tablet  Commonly known as:  ZYRTEC  Take 10 mg by mouth daily.     DSS 100 MG Caps  1 tab 2 times a day while on narcotics.  STOOL SOFTENER     DULoxetine 20 MG capsule  Commonly known as:  CYMBALTA  Take 20 mg by mouth daily.     LORazepam 0.5 MG tablet  Commonly known as:  ATIVAN  Take 0.25 mg by mouth at bedtime as needed. For sleep     oxyCODONE 5 MG immediate release tablet  Commonly known as:  Oxy IR/ROXICODONE  1-2 tablets every 4-6 hrs as needed for pain     PROBIOTIC PO  Take by mouth daily.     Vitamin D-3 5000 UNITS Tabs  Take 5,000 Units by mouth daily.     zoster vaccine live (PF) 19400 UNT/0.65ML injection  Commonly known as:  ZOSTAVAX  Inject 19,400 Units into the skin once.        Diagnostic Studies: Dg Chest 2 View  03/11/2013   CLINICAL DATA:  Total knee arthroplasty  EXAM: CHEST  2 VIEW  COMPARISON:  Prior CT from 01/04/2012  FINDINGS: The cardiac and mediastinal silhouettes are stable in size and contour, and remain within normal limits.  The lungs are normally inflated. No airspace consolidation, pleural effusion, or pulmonary edema is identified. There is no pneumothorax. Irregular biapical pleural thickening noted. Minimal subsegmental atelectasis noted at the left lung base.  No acute osseous abnormality identified.  IMPRESSION: No active cardiopulmonary disease.   Electronically Signed   By: Jeannine Boga M.D.   On: 03/11/2013 14:03    Disposition: 03-Skilled Nursing Facility      Discharge Orders   Future Orders Complete By Expires   Call MD / Call 911  As directed    Comments:     If you experience chest pain or shortness of breath, CALL 911 and be transported to the hospital emergency  room.  If you develope a fever above 101 F, pus (white drainage) or increased drainage or redness at the wound, or calf  pain, call your surgeon's office.   Change dressing  As directed    Comments:     Change the dressing daily with sterile 4 x 4 inch gauze dressing and apply TED hose.  You may clean the incision with alcohol prior to redressing.   Constipation Prevention  As directed    Comments:     Drink plenty of fluids.  Prune juice may be helpful.  You may use a stool softener, such as Colace (over the counter) 100 mg twice a day.  Use MiraLax (over the counter) for constipation as needed.   CPM  As directed    Comments:     Continuous passive motion machine (CPM):      Use the CPM from 0 to 90 for 6 hours per day.       You may break it up into 2 or 3 sessions per day.      Use CPM for 2 weeks or until you are told to stop.   Diet - low sodium heart healthy  As directed    Discharge instructions  As directed    Comments:     Total Knee Replacement Care After Refer to this sheet in the next few weeks. These discharge instructions provide you with general information on caring for yourself after you leave the hospital. Your caregiver may also give you specific instructions. Your treatment has been planned according to the most current medical practices available, but unavoidable complications sometimes occur. If you have any problems or questions after discharge, please call your caregiver. Regaining a near full range of motion of your knee within the first 3 to 6 weeks after surgery is critical. [place] may resume a normal diet and activities as directed.  Perform exercises as directed.  Place gray foam block, curve side up under heel at all times except when in CPM or when walking.  DO NOT modify, tear, cut, or change in any way the gray foam block. You will receive physical therapy daily  Take showers instead of baths until informed otherwise.  You may shower on  Sunday.  Please wash whole leg including wound with soap and water  Change bandages (dressings)daily It is OK to take over-the-counter tylenol in addition to the oxycodone for pain, discomfort, or fever. Oxycodone is VERY constipating.  Please take stool softener twice a day and laxatives daily until bowels are regular Eat a well-balanced diet.  Avoid lifting or driving until you are instructed otherwise.  Make an appointment to see your caregiver for stitches (suture) or staple removal as directed.  If you have been sent home with a continuous passive motion machine (CPM machine), 0-90 degrees 6 hrs a day   2 hrs a shift SEEK MEDICAL CARE IF: You have swelling of your calf or leg.  You develop shortness of breath or chest pain.  You have redness, swelling, or increasing pain in the wound.  There is pus or any unusual drainage coming from the surgical site.  You notice a bad smell coming from the surgical site or dressing.  The surgical site breaks open after sutures or staples have been removed.  There is persistent bleeding from the suture or staple line.  You are getting worse or are not improving.  You have any other questions or concerns.  SEEK IMMEDIATE MEDICAL CARE IF:  You have a fever.  You develop a rash.  You have difficulty breathing.  You develop any reaction or side  effects to medicines given.  Your knee motion is decreasing rather than improving.  MAKE SURE YOU:  Understand these instructions.  Will watch your condition.  Will get help right away if you are not doing well or get worse.   Do not put a pillow under the knee. Place it under the heel.  As directed    Comments:     Place gray foam block, curve side up under heel at all times except when in CPM or when walking.  DO NOT modify, tear, cut, or change in any way the gray foam block.   Increase activity slowly as tolerated  As directed    TED hose  As directed    Comments:     Use stockings (TED hose) for 2  weeks on both leg(s).  You may remove them at night for sleeping.      Follow-up Information   Follow up with Lorn Junes, MD On 04/01/2013. (appt time 2:15)    Specialty:  Orthopedic Surgery   Contact information:   [address] [place] [address] [phone]        Signed: Linda Hedges 03/21/2013, 8:03 AM

## 2013-03-21 NOTE — Progress Notes (Signed)
CSW (Clinical Social Worker) prepared pt dc packet and placed with shadow chart. CSW arranged non-emergent ambulance transport. Pt, pt nurse, and facility informed. CSW signing off.  Brunette Lavalle, LCSWA [phone]  

## 2013-03-27 ENCOUNTER — Non-Acute Institutional Stay (SKILLED_NURSING_FACILITY): Payer: Medicare Other | Admitting: Internal Medicine

## 2013-03-27 DIAGNOSIS — F3289 Other specified depressive episodes: Secondary | ICD-10-CM | POA: Diagnosis not present

## 2013-03-27 DIAGNOSIS — M1712 Unilateral primary osteoarthritis, left knee: Secondary | ICD-10-CM

## 2013-03-27 DIAGNOSIS — D62 Acute posthemorrhagic anemia: Secondary | ICD-10-CM | POA: Insufficient documentation

## 2013-03-27 DIAGNOSIS — M1710 Unilateral primary osteoarthritis, unspecified knee: Secondary | ICD-10-CM | POA: Diagnosis not present

## 2013-03-27 DIAGNOSIS — IMO0002 Reserved for concepts with insufficient information to code with codable children: Secondary | ICD-10-CM | POA: Diagnosis not present

## 2013-03-27 DIAGNOSIS — J309 Allergic rhinitis, unspecified: Secondary | ICD-10-CM | POA: Diagnosis not present

## 2013-03-27 DIAGNOSIS — F32A Depression, unspecified: Secondary | ICD-10-CM

## 2013-03-27 DIAGNOSIS — F329 Major depressive disorder, single episode, unspecified: Secondary | ICD-10-CM | POA: Diagnosis not present

## 2013-03-27 DIAGNOSIS — J302 Other seasonal allergic rhinitis: Secondary | ICD-10-CM

## 2013-03-27 NOTE — Progress Notes (Signed)
HISTORY & PHYSICAL  DATE: 03/27/2013   FACILITY: [facility]  LEVEL OF CARE: SNF (31)  ALLERGIES:  Allergies  Allergen Reactions  . Lipitor [Atorvastatin] Other (See Comments)    Cramps  . Other     NO BLOOD OR BLOOD PRODUCTS  . Pravastatin Other (See Comments)    Cramps     CHIEF COMPLAINT:  Manage left knee osteoarthritis, allergic rhinitis and depression  HISTORY OF PRESENT ILLNESS: Patient is a 73 year old Caucasian female.  KNEE OSTEOARTHRITIS: Patient had a history of pain and functional disability in the knee due to end-stage osteoarthritis and has failed nonsurgical conservative treatments. Patient had worsening of pain with activity and weight bearing, pain that interfered with activities of daily living & pain with passive range of motion. Therefore patient underwent total knee arthroplasty and tolerated the procedure well. Patient is admitted to this facility for sort short-term rehabilitation. Patient denies knee pain.  ALLERGIC RHINITIS: Allergic rhinitis remains stable.  Patient denies ongoing symptoms such as runny nose sneezing or tearing. No complications reported from the current medication(s) being used.  DEPRESSION: The depression remains stable. Patient denies ongoing feelings of sadness, insomnia, anedhonia or lack of appetite. No complications reported from the medications currently being used. Staff do not report behavioral problems.  PAST MEDICAL HISTORY :  Past Medical History  Diagnosis Date  . Seasonal allergies     cats, dust, mold, roaches  . Depression     prior on lexapro, celexa, wellbutrin.  high cymbalta doses cause tremors  . Migraines   . Squamous cell skin cancer   . Arthritis   . Knee osteoarthritis 2013, 2014    bilateral s/p R TKR, pending L TKR [facility])  . Post-operative nausea and vomiting     postop n/v  . Abnormal finding on EKG     seen by Dr. Rockey Situ, cleared by cards for surgery  . History of  pneumonia   . Bacterial UTI Ecoli 01/03/2012    Treatment began preoperatively with CIPRO 500mg  BID  . Refusal of blood transfusions as patient is Jehovah's Witness   . Osteopenia   . HLD (hyperlipidemia)   . Vitamin D deficiency   . Abdominal pain, other specified site 10.2014    [facility] - mesenteric adenitis vs sclerosing mesenteritis vs nonspecific lymphadenitis  . Pneumonia     hx of  . H/O seasonal allergies     PAST SURGICAL HISTORY: Past Surgical History  Procedure Laterality Date  . Tonsillectomy and adenoidectomy  1945  . Abdominal hysterectomy  1980  . Bunionectomy  2013    R and L foot  . Cataract extraction  2013    R, pending L  . Cosmetic surgery  2002    face lift  . Eye surgery      bilateral cataract surgery  . Knee arthroplasty  01/03/2012    right knee  . Total knee arthroplasty  01/02/2012    Procedure: TOTAL KNEE ARTHROPLASTY;  Surgeon: Lorn Junes, MD;  Location: [facility];  Service: Orthopedics;  Laterality: Right;  right total knee replacement  . Breast enhancement surgery    . Mohs surgery  2011    R leg  . Colonoscopy  2007    no records received  . Dexa  06/2008    T -1.4 spine and hip  . Total knee arthroplasty Right   . Total knee arthroplasty Left 03/18/2013    Procedure: TOTAL  KNEE ARTHROPLASTY;  Surgeon: Lorn Junes, MD;  Location: [facility];  Service: Orthopedics;  Laterality: Left;    SOCIAL HISTORY:  reports that she has never smoked. She has never used smokeless tobacco. She reports that she drinks alcohol. She reports that she does not use illicit drugs.  FAMILY HISTORY:  Family History  Problem Relation Age of Onset  . Cancer Mother     lung, smoker  . Cancer Father     lung, smoker  . Cancer Maternal Grandmother 49    leukemia  . Coronary artery disease Paternal Grandmother 89    sudden cardiac death  . Diabetes Neg Hx   . Stroke Neg Hx     CURRENT MEDICATIONS: Reviewed per [facility]  REVIEW OF SYSTEMS:  See HPI otherwise  14 point ROS is negative.  PHYSICAL EXAMINATION  VS:  T 98.5       P 75       RR 18       BP 131/66       POX% 99       GENERAL: no acute distress, normal body habitus EYES: conjunctivae normal, sclerae normal, normal eye lids MOUTH/THROAT: lips without lesions,no lesions in the mouth,tongue is without lesions,uvula elevates in midline NECK: supple, trachea midline, no neck masses, no thyroid tenderness, no thyromegaly LYMPHATICS: no LAN in the neck, no supraclavicular LAN RESPIRATORY: breathing is even & unlabored, BS CTAB CARDIAC: RRR, no murmur,no extra heart sounds, +1 bilateral lower extremity edema GI:  ABDOMEN: abdomen soft, normal BS, no masses, no tenderness  LIVER/SPLEEN: no hepatomegaly, no splenomegaly MUSCULOSKELETAL: HEAD: normal to inspection & palpation BACK: no kyphosis, scoliosis or spinal processes tenderness EXTREMITIES: LEFT UPPER EXTREMITY: full range of motion, normal strength & tone RIGHT UPPER EXTREMITY:  full range of motion, normal strength & tone LEFT LOWER EXTREMITY:   range of motion not tested due to surgery, normal strength & tone RIGHT LOWER EXTREMITY:  full range of motion, normal strength & tone PSYCHIATRIC: the patient is alert & oriented to person, affect & behavior appropriate  LABS/RADIOLOGY:  Labs reviewed: Basic Metabolic Panel:  Recent Labs  03/19/13 0415 03/20/13 0540 03/21/13 0234  NA 143 144 142  K 4.8 4.3 3.9  CL 101 106 103  CO2 28 28 31   GLUCOSE 164* 103* 104*  BUN 10 13 11   CREATININE 0.63 0.66 0.63  CALCIUM 9.2 8.9 8.8   Liver Function Tests:  Recent Labs  01/03/13 1427 03/11/13 1237  AST 33 27  ALT 42* 25  ALKPHOS 114 138*  BILITOT 0.5 0.4  PROT 7.6 8.0  ALBUMIN 4.1 4.2   CBC:  Recent Labs  08/13/12 0826 01/03/13 1427 03/11/13 1237 03/19/13 0415 03/20/13 0540 03/21/13 0234  WBC 5.1 6.2 6.1 9.5 8.3 7.6  NEUTROABS 2.6 4.1 3.2  --   --   --   HGB 13.2 13.5 14.7 12.3 10.3* 10.6*  HCT 39.2 40.4 43.7  36.6 31.0* 32.8*  MCV 88.6 87.4 90.3 89.1 90.4 90.9  PLT 260.0 264.0 268 219 195 191   Lipid Panel:  Recent Labs  08/13/12 0826  HDL 57.50    CHEST  2 VIEW   COMPARISON:  Prior CT from 01/04/2012   FINDINGS: The cardiac and mediastinal silhouettes are stable in size and contour, and remain within normal limits.   The lungs are normally inflated. No airspace consolidation, pleural effusion, or pulmonary edema is identified. There is no pneumothorax. Irregular biapical pleural thickening noted. Minimal subsegmental atelectasis  noted at the left lung base.   No acute osseous abnormality identified.   IMPRESSION: No active cardiopulmonary disease.   ASSESSMENT/PLAN:  Left knee osteoarthritis-status post total knee arthroplasty. Continue rehabilitation. Allergic rhinitis-well-controlled Depression-denies ongoing symptoms Acute blood loss anemia-recheck Constipation-well-controlled Check CBC  I have reviewed patient's medical records received at admission/from hospitalization.  CPT CODE: 07121

## 2013-03-29 ENCOUNTER — Non-Acute Institutional Stay (SKILLED_NURSING_FACILITY): Payer: Medicare Other | Admitting: Adult Health

## 2013-03-29 DIAGNOSIS — J309 Allergic rhinitis, unspecified: Secondary | ICD-10-CM

## 2013-03-29 DIAGNOSIS — M1710 Unilateral primary osteoarthritis, unspecified knee: Secondary | ICD-10-CM | POA: Diagnosis not present

## 2013-03-29 DIAGNOSIS — M1712 Unilateral primary osteoarthritis, left knee: Secondary | ICD-10-CM

## 2013-03-29 DIAGNOSIS — F329 Major depressive disorder, single episode, unspecified: Secondary | ICD-10-CM

## 2013-03-29 DIAGNOSIS — F3289 Other specified depressive episodes: Secondary | ICD-10-CM

## 2013-03-29 DIAGNOSIS — F32A Depression, unspecified: Secondary | ICD-10-CM

## 2013-03-29 DIAGNOSIS — K5900 Constipation, unspecified: Secondary | ICD-10-CM | POA: Diagnosis not present

## 2013-03-29 DIAGNOSIS — IMO0002 Reserved for concepts with insufficient information to code with codable children: Secondary | ICD-10-CM | POA: Diagnosis not present

## 2013-04-01 DIAGNOSIS — Z471 Aftercare following joint replacement surgery: Secondary | ICD-10-CM | POA: Diagnosis not present

## 2013-04-01 DIAGNOSIS — Z96659 Presence of unspecified artificial knee joint: Secondary | ICD-10-CM | POA: Diagnosis not present

## 2013-04-04 DIAGNOSIS — F329 Major depressive disorder, single episode, unspecified: Secondary | ICD-10-CM | POA: Diagnosis not present

## 2013-04-04 DIAGNOSIS — Z96659 Presence of unspecified artificial knee joint: Secondary | ICD-10-CM | POA: Diagnosis not present

## 2013-04-04 DIAGNOSIS — IMO0001 Reserved for inherently not codable concepts without codable children: Secondary | ICD-10-CM | POA: Diagnosis not present

## 2013-04-04 DIAGNOSIS — M1710 Unilateral primary osteoarthritis, unspecified knee: Secondary | ICD-10-CM | POA: Diagnosis not present

## 2013-04-04 DIAGNOSIS — F3289 Other specified depressive episodes: Secondary | ICD-10-CM | POA: Diagnosis not present

## 2013-04-04 DIAGNOSIS — E785 Hyperlipidemia, unspecified: Secondary | ICD-10-CM | POA: Diagnosis not present

## 2013-04-04 DIAGNOSIS — Z471 Aftercare following joint replacement surgery: Secondary | ICD-10-CM | POA: Diagnosis not present

## 2013-04-04 DIAGNOSIS — F411 Generalized anxiety disorder: Secondary | ICD-10-CM | POA: Diagnosis not present

## 2013-04-06 DIAGNOSIS — K5900 Constipation, unspecified: Secondary | ICD-10-CM | POA: Insufficient documentation

## 2013-04-06 NOTE — Progress Notes (Signed)
Patient ID: Kristin Kramer, female   DOB: April 10, 1940, 74 y.o.   MRN: [id number]               PROGRESS NOTE  DATE: 03/29/2013  FACILITY: Nursing Home Location: [facility]  LEVEL OF CARE: SNF (31)  Acute Visit  CHIEF COMPLAINT:  Discharge Notes  HISTORY OF PRESENT ILLNESS: This is a 73 year old female who is for discharge home with Home health PT and Nursing. She has been admitted to [facility] on 03/21/13 from [facility] with DJD S/P Left total knee arthroplasty. Patient was admitted to this facility for short-term rehabilitation after the patient's recent hospitalization.  Patient has completed SNF rehabilitation and therapy has cleared the patient for discharge.   REASSESSMENT OF ONGOING PROBLEM(S):  ALLERGIC RHINITIS: Allergic rhinitis remains stable.  Patient denies ongoing symptoms such as runny nose sneezing or tearing. No complications reported from the current medication(s) being used.  DEPRESSION: The depression remains stable. Patient denies ongoing feelings of sadness, insomnia, anedhonia or lack of appetite. No complications reported from the medications currently being used. Staff do not report behavioral problems.  CONSTIPATION: The constipation remains stable. No complications from the medications presently being used. Patient denies ongoing constipation, abdominal pain, nausea or vomiting.  PAST MEDICAL HISTORY : Reviewed.  No changes.  CURRENT MEDICATIONS: Reviewed per [facility]  REVIEW OF SYSTEMS:  GENERAL: no change in appetite, no fatigue, no weight changes, no fever, chills or weakness RESPIRATORY: no cough, SOB, DOE, wheezing, hemoptysis CARDIAC: no chest pain, edema or palpitations GI: no abdominal pain, diarrhea, constipation, heart burn, nausea or vomiting  PHYSICAL EXAMINATION  VS:  T99       P88     RR18      BP 111/65    POX97 %     WT155.8 (Lb)  GENERAL: no acute distress, normal body habitus EYES: conjunctivae normal, sclerae  normal, normal eye lids NECK: supple, trachea midline, no neck masses, no thyroid tenderness, no thyromegaly LYMPHATICS: no LAN in the neck, no supraclavicular LAN RESPIRATORY: breathing is even & unlabored, BS CTAB CARDIAC: RRR, no murmur,no extra heart sounds, no edema GI: abdomen soft, normal BS, no masses, no tenderness, no hepatomegaly, no splenomegaly PSYCHIATRIC: the patient is alert & oriented to person, affect & behavior appropriate  LABS/RADIOLOGY:  Labs reviewed: Basic Metabolic Panel:  Recent Labs  03/19/13 0415 03/20/13 0540 03/21/13 0234  NA 143 144 142  K 4.8 4.3 3.9  CL 101 106 103  CO2 28 28 31   GLUCOSE 164* 103* 104*  BUN 10 13 11   CREATININE 0.63 0.66 0.63  CALCIUM 9.2 8.9 8.8   Liver Function Tests:  Recent Labs  01/03/13 1427 03/11/13 1237  AST 33 27  ALT 42* 25  ALKPHOS 114 138*  BILITOT 0.5 0.4  PROT 7.6 8.0  ALBUMIN 4.1 4.2   CBC:  Recent Labs  08/13/12 0826 01/03/13 1427 03/11/13 1237 03/19/13 0415 03/20/13 0540 03/21/13 0234  WBC 5.1 6.2 6.1 9.5 8.3 7.6  NEUTROABS 2.6 4.1 3.2  --   --   --   HGB 13.2 13.5 14.7 12.3 10.3* 10.6*  HCT 39.2 40.4 43.7 36.6 31.0* 32.8*  MCV 88.6 87.4 90.3 89.1 90.4 90.9  PLT 260.0 264.0 268 219 195 191    Lipid Panel:  Recent Labs  08/13/12 0826  HDL 57.50    ASSESSMENT/PLAN:  DJD status post left total knee arthroplasties - for home health PT and nursing  Constipation -  no complaints  Allergic rhinitis - continue Zyrtec  Depression - stable; continue Cymbalta   I have filled out patient's discharge paperwork and written prescriptions.  Patient will receive home health PT and Nursing.  Total discharge time: Less than 30 minutes Discharge time involved coordination of the discharge process with Education officer, museum, nursing staff and therapy department. Medical justification for home health services verified.   CPT CODE: 16109

## 2013-04-09 DIAGNOSIS — F3289 Other specified depressive episodes: Secondary | ICD-10-CM | POA: Diagnosis not present

## 2013-04-09 DIAGNOSIS — Z96659 Presence of unspecified artificial knee joint: Secondary | ICD-10-CM | POA: Diagnosis not present

## 2013-04-09 DIAGNOSIS — F329 Major depressive disorder, single episode, unspecified: Secondary | ICD-10-CM | POA: Diagnosis not present

## 2013-04-09 DIAGNOSIS — IMO0001 Reserved for inherently not codable concepts without codable children: Secondary | ICD-10-CM | POA: Diagnosis not present

## 2013-04-09 DIAGNOSIS — F411 Generalized anxiety disorder: Secondary | ICD-10-CM | POA: Diagnosis not present

## 2013-04-09 DIAGNOSIS — E785 Hyperlipidemia, unspecified: Secondary | ICD-10-CM | POA: Diagnosis not present

## 2013-04-09 DIAGNOSIS — Z471 Aftercare following joint replacement surgery: Secondary | ICD-10-CM | POA: Diagnosis not present

## 2013-04-10 DIAGNOSIS — L570 Actinic keratosis: Secondary | ICD-10-CM | POA: Diagnosis not present

## 2013-04-10 DIAGNOSIS — L821 Other seborrheic keratosis: Secondary | ICD-10-CM | POA: Diagnosis not present

## 2013-04-10 DIAGNOSIS — L819 Disorder of pigmentation, unspecified: Secondary | ICD-10-CM | POA: Diagnosis not present

## 2013-04-10 DIAGNOSIS — L259 Unspecified contact dermatitis, unspecified cause: Secondary | ICD-10-CM | POA: Diagnosis not present

## 2013-04-10 DIAGNOSIS — IMO0001 Reserved for inherently not codable concepts without codable children: Secondary | ICD-10-CM | POA: Diagnosis not present

## 2013-04-10 DIAGNOSIS — Z85828 Personal history of other malignant neoplasm of skin: Secondary | ICD-10-CM | POA: Diagnosis not present

## 2013-04-10 DIAGNOSIS — D235 Other benign neoplasm of skin of trunk: Secondary | ICD-10-CM | POA: Diagnosis not present

## 2013-04-10 DIAGNOSIS — F411 Generalized anxiety disorder: Secondary | ICD-10-CM | POA: Diagnosis not present

## 2013-04-10 DIAGNOSIS — Z96659 Presence of unspecified artificial knee joint: Secondary | ICD-10-CM | POA: Diagnosis not present

## 2013-04-10 DIAGNOSIS — F3289 Other specified depressive episodes: Secondary | ICD-10-CM | POA: Diagnosis not present

## 2013-04-10 DIAGNOSIS — E785 Hyperlipidemia, unspecified: Secondary | ICD-10-CM | POA: Diagnosis not present

## 2013-04-10 DIAGNOSIS — Z471 Aftercare following joint replacement surgery: Secondary | ICD-10-CM | POA: Diagnosis not present

## 2013-04-10 DIAGNOSIS — D1801 Hemangioma of skin and subcutaneous tissue: Secondary | ICD-10-CM | POA: Diagnosis not present

## 2013-04-10 DIAGNOSIS — F329 Major depressive disorder, single episode, unspecified: Secondary | ICD-10-CM | POA: Diagnosis not present

## 2013-04-11 DIAGNOSIS — F329 Major depressive disorder, single episode, unspecified: Secondary | ICD-10-CM | POA: Diagnosis not present

## 2013-04-11 DIAGNOSIS — IMO0001 Reserved for inherently not codable concepts without codable children: Secondary | ICD-10-CM | POA: Diagnosis not present

## 2013-04-11 DIAGNOSIS — Z471 Aftercare following joint replacement surgery: Secondary | ICD-10-CM | POA: Diagnosis not present

## 2013-04-11 DIAGNOSIS — F411 Generalized anxiety disorder: Secondary | ICD-10-CM | POA: Diagnosis not present

## 2013-04-11 DIAGNOSIS — F3289 Other specified depressive episodes: Secondary | ICD-10-CM | POA: Diagnosis not present

## 2013-04-11 DIAGNOSIS — E785 Hyperlipidemia, unspecified: Secondary | ICD-10-CM | POA: Diagnosis not present

## 2013-04-11 DIAGNOSIS — Z96659 Presence of unspecified artificial knee joint: Secondary | ICD-10-CM | POA: Diagnosis not present

## 2013-04-15 DIAGNOSIS — Z96659 Presence of unspecified artificial knee joint: Secondary | ICD-10-CM | POA: Diagnosis not present

## 2013-04-15 DIAGNOSIS — F3289 Other specified depressive episodes: Secondary | ICD-10-CM | POA: Diagnosis not present

## 2013-04-15 DIAGNOSIS — E785 Hyperlipidemia, unspecified: Secondary | ICD-10-CM | POA: Diagnosis not present

## 2013-04-15 DIAGNOSIS — Z471 Aftercare following joint replacement surgery: Secondary | ICD-10-CM | POA: Diagnosis not present

## 2013-04-15 DIAGNOSIS — F411 Generalized anxiety disorder: Secondary | ICD-10-CM | POA: Diagnosis not present

## 2013-04-15 DIAGNOSIS — F329 Major depressive disorder, single episode, unspecified: Secondary | ICD-10-CM | POA: Diagnosis not present

## 2013-04-15 DIAGNOSIS — IMO0001 Reserved for inherently not codable concepts without codable children: Secondary | ICD-10-CM | POA: Diagnosis not present

## 2013-04-18 DIAGNOSIS — M25669 Stiffness of unspecified knee, not elsewhere classified: Secondary | ICD-10-CM | POA: Diagnosis not present

## 2013-04-18 DIAGNOSIS — M1710 Unilateral primary osteoarthritis, unspecified knee: Secondary | ICD-10-CM | POA: Diagnosis not present

## 2013-04-23 DIAGNOSIS — M1710 Unilateral primary osteoarthritis, unspecified knee: Secondary | ICD-10-CM | POA: Diagnosis not present

## 2013-04-23 DIAGNOSIS — M25669 Stiffness of unspecified knee, not elsewhere classified: Secondary | ICD-10-CM | POA: Diagnosis not present

## 2013-04-25 DIAGNOSIS — M1710 Unilateral primary osteoarthritis, unspecified knee: Secondary | ICD-10-CM | POA: Diagnosis not present

## 2013-04-29 DIAGNOSIS — M25669 Stiffness of unspecified knee, not elsewhere classified: Secondary | ICD-10-CM | POA: Diagnosis not present

## 2013-04-29 DIAGNOSIS — M1710 Unilateral primary osteoarthritis, unspecified knee: Secondary | ICD-10-CM | POA: Diagnosis not present

## 2013-05-09 DIAGNOSIS — M1710 Unilateral primary osteoarthritis, unspecified knee: Secondary | ICD-10-CM | POA: Diagnosis not present

## 2013-05-09 DIAGNOSIS — M25069 Hemarthrosis, unspecified knee: Secondary | ICD-10-CM | POA: Diagnosis not present

## 2013-05-09 DIAGNOSIS — M25669 Stiffness of unspecified knee, not elsewhere classified: Secondary | ICD-10-CM | POA: Diagnosis not present

## 2013-05-13 DIAGNOSIS — M25669 Stiffness of unspecified knee, not elsewhere classified: Secondary | ICD-10-CM | POA: Diagnosis not present

## 2013-05-13 DIAGNOSIS — M25069 Hemarthrosis, unspecified knee: Secondary | ICD-10-CM | POA: Diagnosis not present

## 2013-05-13 DIAGNOSIS — M1710 Unilateral primary osteoarthritis, unspecified knee: Secondary | ICD-10-CM | POA: Diagnosis not present

## 2013-05-21 DIAGNOSIS — M1710 Unilateral primary osteoarthritis, unspecified knee: Secondary | ICD-10-CM | POA: Diagnosis not present

## 2013-05-21 DIAGNOSIS — M25669 Stiffness of unspecified knee, not elsewhere classified: Secondary | ICD-10-CM | POA: Diagnosis not present

## 2013-05-23 DIAGNOSIS — M25669 Stiffness of unspecified knee, not elsewhere classified: Secondary | ICD-10-CM | POA: Diagnosis not present

## 2013-05-23 DIAGNOSIS — M1710 Unilateral primary osteoarthritis, unspecified knee: Secondary | ICD-10-CM | POA: Diagnosis not present

## 2013-05-30 DIAGNOSIS — H26499 Other secondary cataract, unspecified eye: Secondary | ICD-10-CM | POA: Diagnosis not present

## 2013-05-30 DIAGNOSIS — M1710 Unilateral primary osteoarthritis, unspecified knee: Secondary | ICD-10-CM | POA: Diagnosis not present

## 2013-06-04 DIAGNOSIS — M25669 Stiffness of unspecified knee, not elsewhere classified: Secondary | ICD-10-CM | POA: Diagnosis not present

## 2013-06-04 DIAGNOSIS — M1710 Unilateral primary osteoarthritis, unspecified knee: Secondary | ICD-10-CM | POA: Diagnosis not present

## 2013-06-10 DIAGNOSIS — H26499 Other secondary cataract, unspecified eye: Secondary | ICD-10-CM | POA: Diagnosis not present

## 2013-06-11 DIAGNOSIS — M25669 Stiffness of unspecified knee, not elsewhere classified: Secondary | ICD-10-CM | POA: Diagnosis not present

## 2013-06-11 DIAGNOSIS — M1710 Unilateral primary osteoarthritis, unspecified knee: Secondary | ICD-10-CM | POA: Diagnosis not present

## 2013-06-13 ENCOUNTER — Encounter: Payer: Self-pay | Admitting: Cardiovascular Disease

## 2013-06-13 ENCOUNTER — Ambulatory Visit (INDEPENDENT_AMBULATORY_CARE_PROVIDER_SITE_OTHER): Payer: Medicare Other | Admitting: Cardiovascular Disease

## 2013-06-13 VITALS — BP 104/62 | HR 83 | Ht 65.5 in | Wt 150.8 lb

## 2013-06-13 DIAGNOSIS — E785 Hyperlipidemia, unspecified: Secondary | ICD-10-CM | POA: Diagnosis not present

## 2013-06-13 DIAGNOSIS — R079 Chest pain, unspecified: Secondary | ICD-10-CM

## 2013-06-13 DIAGNOSIS — R9431 Abnormal electrocardiogram [ECG] [EKG]: Secondary | ICD-10-CM | POA: Diagnosis not present

## 2013-06-13 NOTE — Assessment & Plan Note (Signed)
No recent episodes of chest pain. No further workup needed at this time

## 2013-06-13 NOTE — Assessment & Plan Note (Signed)
She is willing to try Crestor 5 mg 3 days per week. We'll check cholesterol in several months time

## 2013-06-13 NOTE — Assessment & Plan Note (Signed)
Stable EKG. No clinical changes. No further testing

## 2013-06-13 NOTE — Patient Instructions (Signed)
You are doing well.  Please try crestor 5 mg three times a week to start check cholesterol in three months  Please call us if you have new issues that need to be addressed before your next appt.  Your physician wants you to follow-up in: 12 months.  You will receive a reminder letter in the mail two months in advance. If you don't receive a letter, please call our office to schedule the follow-up appointment.

## 2013-06-13 NOTE — Progress Notes (Signed)
Patient ID: Kristin Kramer, female    DOB: 12/15/1940, 73 y.o.   MRN: [id number]  HPI Comments: Kristin Kramer is a very pleasant 73 year old woman with a history of hyperlipidemia, prior foot surgery, osteoarthritis of her knees, total knee replacement at [facility] on 01/02/2012, previously presenting for abnormal EKG and preop eval who presents for routine followup.  Chest pain postoperatively after knee replacement surgery . Prior  echocardiogram  was normal, cardiac enzymes were normal. No stress test was performed.  In followup today, she has had her left knee replacement done but continues to have knee pain. She is doing physical therapy. She did not tolerate red yeast rice, Lipitor, pravastatin. She had myalgias. Total cholesterol was 240 up to 250. Otherwise she feels well with no complaints  No significant lower extremity edema, no shortness of breath with exertion.  She reports that her father passed away at age 3 from lung cancer, mother at age 24 for lung cancer. They were both smokers  EKG today shows normal sinus rhythm with rate 83 beats per minute, incomplete right bundle branch block, left axis deviation/left anterior fascicular block. No significant change from prior EKG.   Outpatient Encounter Prescriptions as of 06/13/2013  Medication Sig  . acetaminophen (TYLENOL) 325 MG tablet Take 650 mg by mouth every 6 (six) hours as needed. For pain  . bisacodyl (DULCOLAX) 5 MG EC tablet Take 2 tablets every night with dinner until bowel movement.  LAXITIVE.  Restart if two days since last bowel movement  . calcium carbonate (OS-CAL) 600 MG TABS Take 600 mg by mouth 2 (two) times daily with a meal.  . cetirizine (ZYRTEC) 10 MG tablet Take 10 mg by mouth daily.  . Cholecalciferol (VITAMIN D-3) 5000 UNITS TABS Take 5,000 Units by mouth daily.  . DULoxetine (CYMBALTA) 20 MG capsule Take 20 mg by mouth daily.  Marland Kitchen LORazepam (ATIVAN) 0.5 MG tablet Take 0.25 mg by mouth at bedtime as needed. For  sleep  . oxyCODONE (OXY IR/ROXICODONE) 5 MG immediate release tablet 1-2 tablets every 4-6 hrs as needed for pain  . Probiotic Product (PROBIOTIC PO) Take by mouth daily.  Marland Kitchen zoster vaccine live, PF, (ZOSTAVAX) 34196 UNT/0.65ML injection Inject 19,400 Units into the skin once.   Review of Systems  Constitutional: Negative.   HENT: Negative.   Eyes: Negative.   Respiratory: Negative.   Cardiovascular: Negative.   Gastrointestinal: Negative.   Endocrine: Negative.   Musculoskeletal: Positive for arthralgias and joint swelling.  Skin: Negative.   Allergic/Immunologic: Negative.   Neurological: Negative.   Hematological: Negative.   Psychiatric/Behavioral: Negative.   All other systems reviewed and are negative.   BP 104/62  Pulse 83  Ht 5' 5.5" (1.664 m)  Wt 150 lb 12 oz (68.38 kg)  BMI 24.70 kg/m2  Physical Exam  Nursing note and vitals reviewed. Constitutional: She is oriented to person, place, and time. She appears well-developed and well-nourished.  HENT:  Head: Normocephalic.  Nose: Nose normal.  Mouth/Throat: Oropharynx is clear and moist.  Eyes: Conjunctivae are normal. Pupils are equal, round, and reactive to light.  Neck: Normal range of motion. Neck supple. No JVD present.  Cardiovascular: Normal rate, regular rhythm, S1 normal, S2 normal, normal heart sounds and intact distal pulses.  Exam reveals no gallop and no friction rub.   No murmur heard. Pulmonary/Chest: Effort normal and breath sounds normal. No respiratory distress. She has no wheezes. She has no rales. She exhibits no tenderness.  Abdominal: Soft. Bowel  sounds are normal. She exhibits no distension. There is no tenderness.  Musculoskeletal: Normal range of motion. She exhibits no edema and no tenderness.  Lymphadenopathy:    She has no cervical adenopathy.  Neurological: She is alert and oriented to person, place, and time. Coordination normal.  Skin: Skin is warm and dry. No rash noted. No erythema.   Psychiatric: She has a normal mood and affect. Her behavior is normal. Judgment and thought content normal.    Assessment and Plan

## 2013-06-18 DIAGNOSIS — M25669 Stiffness of unspecified knee, not elsewhere classified: Secondary | ICD-10-CM | POA: Diagnosis not present

## 2013-06-18 DIAGNOSIS — M1710 Unilateral primary osteoarthritis, unspecified knee: Secondary | ICD-10-CM | POA: Diagnosis not present

## 2013-06-20 DIAGNOSIS — M25669 Stiffness of unspecified knee, not elsewhere classified: Secondary | ICD-10-CM | POA: Diagnosis not present

## 2013-06-20 DIAGNOSIS — M1710 Unilateral primary osteoarthritis, unspecified knee: Secondary | ICD-10-CM | POA: Diagnosis not present

## 2013-06-25 DIAGNOSIS — M1710 Unilateral primary osteoarthritis, unspecified knee: Secondary | ICD-10-CM | POA: Diagnosis not present

## 2013-06-25 DIAGNOSIS — Z96659 Presence of unspecified artificial knee joint: Secondary | ICD-10-CM | POA: Diagnosis not present

## 2013-06-25 DIAGNOSIS — M25629 Stiffness of unspecified elbow, not elsewhere classified: Secondary | ICD-10-CM | POA: Diagnosis not present

## 2013-06-25 DIAGNOSIS — M25669 Stiffness of unspecified knee, not elsewhere classified: Secondary | ICD-10-CM | POA: Diagnosis not present

## 2013-06-27 DIAGNOSIS — Z96659 Presence of unspecified artificial knee joint: Secondary | ICD-10-CM | POA: Diagnosis not present

## 2013-06-27 DIAGNOSIS — M25669 Stiffness of unspecified knee, not elsewhere classified: Secondary | ICD-10-CM | POA: Diagnosis not present

## 2013-06-27 DIAGNOSIS — M25629 Stiffness of unspecified elbow, not elsewhere classified: Secondary | ICD-10-CM | POA: Diagnosis not present

## 2013-06-27 DIAGNOSIS — M1710 Unilateral primary osteoarthritis, unspecified knee: Secondary | ICD-10-CM | POA: Diagnosis not present

## 2013-07-02 DIAGNOSIS — M1710 Unilateral primary osteoarthritis, unspecified knee: Secondary | ICD-10-CM | POA: Diagnosis not present

## 2013-07-02 DIAGNOSIS — M25669 Stiffness of unspecified knee, not elsewhere classified: Secondary | ICD-10-CM | POA: Diagnosis not present

## 2013-07-04 DIAGNOSIS — M25669 Stiffness of unspecified knee, not elsewhere classified: Secondary | ICD-10-CM | POA: Diagnosis not present

## 2013-07-04 DIAGNOSIS — M1710 Unilateral primary osteoarthritis, unspecified knee: Secondary | ICD-10-CM | POA: Diagnosis not present

## 2013-07-05 DIAGNOSIS — C4492 Squamous cell carcinoma of skin, unspecified: Secondary | ICD-10-CM

## 2013-07-05 HISTORY — DX: Squamous cell carcinoma of skin, unspecified: C44.92

## 2013-07-09 DIAGNOSIS — M25669 Stiffness of unspecified knee, not elsewhere classified: Secondary | ICD-10-CM | POA: Diagnosis not present

## 2013-07-09 DIAGNOSIS — M1710 Unilateral primary osteoarthritis, unspecified knee: Secondary | ICD-10-CM | POA: Diagnosis not present

## 2013-07-10 DIAGNOSIS — M1710 Unilateral primary osteoarthritis, unspecified knee: Secondary | ICD-10-CM | POA: Diagnosis not present

## 2013-07-11 DIAGNOSIS — M1710 Unilateral primary osteoarthritis, unspecified knee: Secondary | ICD-10-CM | POA: Diagnosis not present

## 2013-07-11 DIAGNOSIS — C44721 Squamous cell carcinoma of skin of unspecified lower limb, including hip: Secondary | ICD-10-CM | POA: Diagnosis not present

## 2013-07-11 DIAGNOSIS — Z85828 Personal history of other malignant neoplasm of skin: Secondary | ICD-10-CM | POA: Diagnosis not present

## 2013-07-11 DIAGNOSIS — L821 Other seborrheic keratosis: Secondary | ICD-10-CM | POA: Diagnosis not present

## 2013-07-11 DIAGNOSIS — M25669 Stiffness of unspecified knee, not elsewhere classified: Secondary | ICD-10-CM | POA: Diagnosis not present

## 2013-07-18 ENCOUNTER — Encounter: Payer: Self-pay | Admitting: Family Medicine

## 2013-08-07 DIAGNOSIS — Z85828 Personal history of other malignant neoplasm of skin: Secondary | ICD-10-CM | POA: Diagnosis not present

## 2013-08-07 DIAGNOSIS — C44721 Squamous cell carcinoma of skin of unspecified lower limb, including hip: Secondary | ICD-10-CM | POA: Diagnosis not present

## 2013-08-07 DIAGNOSIS — L98499 Non-pressure chronic ulcer of skin of other sites with unspecified severity: Secondary | ICD-10-CM | POA: Diagnosis not present

## 2013-08-14 ENCOUNTER — Encounter: Payer: Self-pay | Admitting: Family Medicine

## 2013-08-21 DIAGNOSIS — M25669 Stiffness of unspecified knee, not elsewhere classified: Secondary | ICD-10-CM | POA: Diagnosis not present

## 2013-09-11 DIAGNOSIS — Z85828 Personal history of other malignant neoplasm of skin: Secondary | ICD-10-CM | POA: Diagnosis not present

## 2013-09-11 DIAGNOSIS — C4492 Squamous cell carcinoma of skin, unspecified: Secondary | ICD-10-CM | POA: Diagnosis not present

## 2013-09-16 ENCOUNTER — Other Ambulatory Visit: Payer: Self-pay | Admitting: Family Medicine

## 2013-09-26 DIAGNOSIS — D045 Carcinoma in situ of skin of trunk: Secondary | ICD-10-CM | POA: Diagnosis not present

## 2013-09-26 DIAGNOSIS — L819 Disorder of pigmentation, unspecified: Secondary | ICD-10-CM | POA: Diagnosis not present

## 2013-09-26 DIAGNOSIS — D0460 Carcinoma in situ of skin of unspecified upper limb, including shoulder: Secondary | ICD-10-CM | POA: Diagnosis not present

## 2013-09-26 DIAGNOSIS — L821 Other seborrheic keratosis: Secondary | ICD-10-CM | POA: Diagnosis not present

## 2013-09-26 DIAGNOSIS — D239 Other benign neoplasm of skin, unspecified: Secondary | ICD-10-CM | POA: Diagnosis not present

## 2013-09-26 DIAGNOSIS — L820 Inflamed seborrheic keratosis: Secondary | ICD-10-CM | POA: Diagnosis not present

## 2013-09-26 DIAGNOSIS — L570 Actinic keratosis: Secondary | ICD-10-CM | POA: Diagnosis not present

## 2013-09-26 DIAGNOSIS — Z85828 Personal history of other malignant neoplasm of skin: Secondary | ICD-10-CM | POA: Diagnosis not present

## 2013-09-26 DIAGNOSIS — D485 Neoplasm of uncertain behavior of skin: Secondary | ICD-10-CM | POA: Diagnosis not present

## 2013-10-23 DIAGNOSIS — Z85828 Personal history of other malignant neoplasm of skin: Secondary | ICD-10-CM | POA: Diagnosis not present

## 2013-10-23 DIAGNOSIS — B079 Viral wart, unspecified: Secondary | ICD-10-CM | POA: Diagnosis not present

## 2013-11-09 DIAGNOSIS — H10029 Other mucopurulent conjunctivitis, unspecified eye: Secondary | ICD-10-CM | POA: Diagnosis not present

## 2013-12-17 ENCOUNTER — Other Ambulatory Visit: Payer: Self-pay | Admitting: Family Medicine

## 2013-12-26 DIAGNOSIS — J3089 Other allergic rhinitis: Secondary | ICD-10-CM | POA: Diagnosis not present

## 2013-12-26 DIAGNOSIS — H1045 Other chronic allergic conjunctivitis: Secondary | ICD-10-CM | POA: Diagnosis not present

## 2013-12-27 DIAGNOSIS — C44722 Squamous cell carcinoma of skin of right lower limb, including hip: Secondary | ICD-10-CM | POA: Diagnosis not present

## 2013-12-27 DIAGNOSIS — Z85828 Personal history of other malignant neoplasm of skin: Secondary | ICD-10-CM | POA: Diagnosis not present

## 2013-12-27 DIAGNOSIS — Z419 Encounter for procedure for purposes other than remedying health state, unspecified: Secondary | ICD-10-CM | POA: Diagnosis not present

## 2013-12-27 DIAGNOSIS — D485 Neoplasm of uncertain behavior of skin: Secondary | ICD-10-CM | POA: Diagnosis not present

## 2014-01-04 ENCOUNTER — Encounter: Payer: Self-pay | Admitting: Family Medicine

## 2014-01-08 DIAGNOSIS — Z85828 Personal history of other malignant neoplasm of skin: Secondary | ICD-10-CM | POA: Diagnosis not present

## 2014-01-08 DIAGNOSIS — C44722 Squamous cell carcinoma of skin of right lower limb, including hip: Secondary | ICD-10-CM | POA: Diagnosis not present

## 2014-01-29 DIAGNOSIS — D485 Neoplasm of uncertain behavior of skin: Secondary | ICD-10-CM | POA: Diagnosis not present

## 2014-01-29 DIAGNOSIS — Z85828 Personal history of other malignant neoplasm of skin: Secondary | ICD-10-CM | POA: Diagnosis not present

## 2014-01-29 DIAGNOSIS — L570 Actinic keratosis: Secondary | ICD-10-CM | POA: Diagnosis not present

## 2014-01-29 DIAGNOSIS — D0462 Carcinoma in situ of skin of left upper limb, including shoulder: Secondary | ICD-10-CM | POA: Diagnosis not present

## 2014-02-10 DIAGNOSIS — Z23 Encounter for immunization: Secondary | ICD-10-CM | POA: Diagnosis not present

## 2014-02-19 ENCOUNTER — Encounter: Payer: Self-pay | Admitting: Family Medicine

## 2014-02-19 ENCOUNTER — Ambulatory Visit (INDEPENDENT_AMBULATORY_CARE_PROVIDER_SITE_OTHER): Payer: Medicare Other | Admitting: Family Medicine

## 2014-02-19 VITALS — BP 128/84 | HR 76 | Temp 98.1°F | Wt 156.5 lb

## 2014-02-19 DIAGNOSIS — B029 Zoster without complications: Secondary | ICD-10-CM

## 2014-02-19 HISTORY — DX: Zoster without complications: B02.9

## 2014-02-19 MED ORDER — LORAZEPAM 0.5 MG PO TABS: 0.2500 mg | ORAL_TABLET | Freq: Every evening | ORAL | Status: DC | PRN

## 2014-02-19 MED ORDER — HYDROCODONE-ACETAMINOPHEN 5-325 MG PO TABS: 1.0000 | ORAL_TABLET | Freq: Four times a day (QID) | ORAL | Status: DC | PRN

## 2014-02-19 MED ORDER — VALACYCLOVIR HCL 1 G PO TABS: 1000.0000 mg | ORAL_TABLET | Freq: Three times a day (TID) | ORAL | Status: DC

## 2014-02-19 NOTE — Progress Notes (Signed)
Pre visit review using our clinic review tool, if applicable. No additional management support is needed unless otherwise documented below in the visit note. 

## 2014-02-19 NOTE — Patient Instructions (Signed)

## 2014-02-19 NOTE — Progress Notes (Signed)
   BP 128/84 mmHg  Pulse 76  Temp(Src) 98.1 F (36.7 C) (Oral)  Wt 156 lb 8 oz (70.988 kg)   CC: R leg pain  Subjective:    Patient ID: Kristin Kramer, female    DOB: 09-11-1940, 73 y.o.   MRN: [id number]  HPI: Kristin Kramer is a 73 y.o. female presenting on 02/19/2014 for Leg Pain   6-7 d ago noticed pain in back of R leg. Radiation of pain down posterior R leg. Noticing ankle pain and swelling of ankle and calf. Trouble sleeping 2/2 pain. This morning noticed spreading rash on R posterior leg, tender. Describes deep ache of leg.  No fevers/chills, back pain, inciting trauma/injury. No numbness or paresthesias.  Tylenol doesn't help. Aleve caused nausea but helped pain. Hydrocodone helped.   Recent skin cancer R medial lower leg s/p MOHS surgery x2 over last 6 months.  Denies recent travel. Not on HRT. No fmhx or personal blood clots.  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 Interim medical history since our last visit reviewed. Allergies and medications reviewed and updated.  Current Outpatient Prescriptions on File Prior to Visit  Medication Sig  . acetaminophen (TYLENOL) 325 MG tablet Take 650 mg by mouth every 6 (six) hours as needed. For pain  . bisacodyl (DULCOLAX) 5 MG EC tablet Take 2 tablets every night with dinner until bowel movement.  LAXITIVE.  Restart if two days since last bowel movement  . calcium carbonate (OS-CAL) 600 MG TABS Take 600 mg by mouth 2 (two) times daily with a meal.  . cetirizine (ZYRTEC) 10 MG tablet Take 10 mg by mouth daily.  . Cholecalciferol (VITAMIN D-3) 5000 UNITS TABS Take 5,000 Units by mouth daily.  . DULoxetine (CYMBALTA) 20 MG capsule TAKE ONE CAPSULE BY MOUTH EVERY DAY  . oxyCODONE (OXY IR/ROXICODONE) 5 MG immediate release tablet 1-2 tablets every 4-6 hrs as needed for pain  . Probiotic Product (PROBIOTIC PO) Take by mouth daily.   No  current facility-administered medications on file prior to visit.    Review of Systems Per HPI unless specifically indicated above     Objective:    BP 128/84 mmHg  Pulse 76  Temp(Src) 98.1 F (36.7 C) (Oral)  Wt 156 lb 8 oz (70.988 kg)  Wt Readings from Last 3 Encounters:  02/19/14 156 lb 8 oz (70.988 kg)  06/13/13 150 lb 12 oz (68.38 kg)  03/11/13 152 lb 6.4 oz (69.128 kg)    Physical Exam  Constitutional: She appears well-developed and well-nourished. No distress.  Musculoskeletal: She exhibits no edema.  No palpable cords. No obvious calf or leg swelling  Skin: Skin is warm and dry. Rash noted.  Erythematous vesicular rash R buttock along L5/S1 dermatome  Nursing note and vitals reviewed.      Assessment & Plan:   Problem List Items Addressed This Visit    Herpes zoster - Primary    R leg shingles despite zostavax earlier this year. Treat with valtrex and hydrocodone. Valtrex prescribed although past 3 d window 2/2 continued spread of rash. Update if not improving. Handout provided    Relevant Medications      valACYclovir (VALTREX) tablet       Follow up plan: Return if symptoms worsen or fail to improve.

## 2014-02-19 NOTE — Assessment & Plan Note (Signed)
R leg shingles despite zostavax earlier this year. Treat with valtrex and hydrocodone. Valtrex prescribed although past 3 d window 2/2 continued spread of rash. Update if not improving. Handout provided

## 2014-03-13 ENCOUNTER — Other Ambulatory Visit: Payer: Self-pay | Admitting: Family Medicine

## 2014-04-10 DIAGNOSIS — Z419 Encounter for procedure for purposes other than remedying health state, unspecified: Secondary | ICD-10-CM | POA: Diagnosis not present

## 2014-04-10 DIAGNOSIS — C44722 Squamous cell carcinoma of skin of right lower limb, including hip: Secondary | ICD-10-CM | POA: Diagnosis not present

## 2014-04-10 DIAGNOSIS — Z85828 Personal history of other malignant neoplasm of skin: Secondary | ICD-10-CM | POA: Diagnosis not present

## 2014-04-10 DIAGNOSIS — D485 Neoplasm of uncertain behavior of skin: Secondary | ICD-10-CM | POA: Diagnosis not present

## 2014-04-10 DIAGNOSIS — C44712 Basal cell carcinoma of skin of right lower limb, including hip: Secondary | ICD-10-CM | POA: Diagnosis not present

## 2014-05-15 DIAGNOSIS — C44722 Squamous cell carcinoma of skin of right lower limb, including hip: Secondary | ICD-10-CM | POA: Diagnosis not present

## 2014-05-15 DIAGNOSIS — D2262 Melanocytic nevi of left upper limb, including shoulder: Secondary | ICD-10-CM | POA: Diagnosis not present

## 2014-05-15 DIAGNOSIS — D225 Melanocytic nevi of trunk: Secondary | ICD-10-CM | POA: Diagnosis not present

## 2014-05-15 DIAGNOSIS — L821 Other seborrheic keratosis: Secondary | ICD-10-CM | POA: Diagnosis not present

## 2014-05-15 DIAGNOSIS — D2261 Melanocytic nevi of right upper limb, including shoulder: Secondary | ICD-10-CM | POA: Diagnosis not present

## 2014-05-15 DIAGNOSIS — D2272 Melanocytic nevi of left lower limb, including hip: Secondary | ICD-10-CM | POA: Diagnosis not present

## 2014-05-15 DIAGNOSIS — D2271 Melanocytic nevi of right lower limb, including hip: Secondary | ICD-10-CM | POA: Diagnosis not present

## 2014-05-15 DIAGNOSIS — L570 Actinic keratosis: Secondary | ICD-10-CM | POA: Diagnosis not present

## 2014-05-15 DIAGNOSIS — D485 Neoplasm of uncertain behavior of skin: Secondary | ICD-10-CM | POA: Diagnosis not present

## 2014-05-15 DIAGNOSIS — D1801 Hemangioma of skin and subcutaneous tissue: Secondary | ICD-10-CM | POA: Diagnosis not present

## 2014-05-15 DIAGNOSIS — L718 Other rosacea: Secondary | ICD-10-CM | POA: Diagnosis not present

## 2014-05-15 DIAGNOSIS — Z85828 Personal history of other malignant neoplasm of skin: Secondary | ICD-10-CM | POA: Diagnosis not present

## 2014-05-21 ENCOUNTER — Telehealth: Payer: Self-pay

## 2014-05-21 ENCOUNTER — Telehealth: Payer: Self-pay | Admitting: Family Medicine

## 2014-05-21 ENCOUNTER — Encounter: Payer: Self-pay | Admitting: Nurse Practitioner

## 2014-05-21 ENCOUNTER — Ambulatory Visit (INDEPENDENT_AMBULATORY_CARE_PROVIDER_SITE_OTHER): Payer: Medicare Other | Admitting: Nurse Practitioner

## 2014-05-21 VITALS — BP 120/90 | HR 85 | Ht 65.0 in | Wt 160.0 lb

## 2014-05-21 DIAGNOSIS — S2231XA Fracture of one rib, right side, initial encounter for closed fracture: Secondary | ICD-10-CM | POA: Insufficient documentation

## 2014-05-21 DIAGNOSIS — R0789 Other chest pain: Secondary | ICD-10-CM

## 2014-05-21 DIAGNOSIS — R079 Chest pain, unspecified: Secondary | ICD-10-CM | POA: Diagnosis not present

## 2014-05-21 HISTORY — DX: Fracture of one rib, right side, initial encounter for closed fracture: S22.31XA

## 2014-05-21 NOTE — Telephone Encounter (Signed)
Patient is having right sided chest pain  She stated it feels like dull pressure and has not gone away with any of her usual pain medication  She was seen by her PCP today and was told to go to the ED  Discussed patient symptoms with Dr. Rockey Situ  Patient is scheduled to see Ignacia Bayley NP  Patient verbalized understanding

## 2014-05-21 NOTE — Telephone Encounter (Signed)
Pt states PCP has suggested she go to the ED. She woke up Monday a.m. with "steady chest pain".  Location, right side at the top of her right side. States when she moves around it is worse, and not getting better.   ?

## 2014-05-21 NOTE — Telephone Encounter (Signed)
Appears pt has appt with Murray Hodgkins NP with cardiology today at 2pm.

## 2014-05-21 NOTE — Patient Instructions (Signed)
Please take Aleve (generic is naproxen) 1 tab twice daily for 5 days Please take Prilosec OTC 20 mg once daily while taking Aleve  Please follow up with your primary care doctor if the pain persists

## 2014-05-21 NOTE — Telephone Encounter (Signed)
PLEASE NOTE: All timestamps contained within this report are represented as [place] Standard Time. CONFIDENTIALTY NOTICE: This fax transmission is intended only for the addressee. It contains information that is legally privileged, confidential or otherwise protected from use or disclosure. If you are not the intended recipient, you are strictly prohibited from reviewing, disclosing, copying using or disseminating any of this information or taking any action in reliance on or regarding this information. If you have received this fax in error, please notify us immediately by telephone so that we can arrange for its return to [place]. Phone: [phone], Toll-Free: [phone], Fax: [phone] Page: 1 of 2 Call Id: [id number] [place] Patient Name: Kristin Kramer Gender: Female DOB: 1940/05/13 Age: 74 Y 3 M 7 D Return Phone Number: [phone] (Primary) Address: [address] City/State/Zip: [place] [address] Client [facility] - Client Client Site [place] [place] Type Call Call Type Triage / Clinical Relationship To Patient Self Appointment Disposition EMR Patient Reports Appointment Already Scheduled Info pasted into Epic Yes Return Phone Number [phone] (Primary) Chief Complaint CHEST PAIN (>=21 years) - pain, pressure, heaviness or tightness Initial Comment caller states she thinks she pulled a muscle in her chest area, having chest pains PreDisposition Call Doctor Nurse Assessment Nurse: Julien Girt, RN, Almyra Free Date/Time Eilene Ghazi Time): 05/21/2014 1:01:41 PM Confirm and document reason for call. If symptomatic, describe symptoms. ---Caller states she thinks she pulled a muscle in her chest area, she woke up Monday morning with right sided chest pain that increased with movement. She has been using  heat and taking tylenol, but it is not any better. The pain never goes away, does wake her up at night and she denies trouble breathing. Has the patient traveled out of the country within the last 30 days? ---Not Applicable Does the patient require triage? ---Yes Related visit to physician within the last 2 weeks? ---No Does the PT have any chronic conditions? (i.e. diabetes, asthma, etc.) ---Yes List chronic conditions. ---depression, allergies Guidelines Guideline Title Affirmed Question Affirmed Notes Nurse Date/Time Eilene Ghazi Time) Chest Pain [1] Chest pain lasts > 5 minutes AND [2] age > 74 Chancy Hurter 05/21/2014 1:04:28 PM Disp. Time Eilene Ghazi Time) Disposition Final User 05/21/2014 12:59:37 PM Send to Urgent Queue Madden, Piazza 05/21/2014 1:16:32 PM Paged On Call to Patient Julien Girt, RN, Almyra Free Reason: Called back line, spoke to Cari(sp?), information provided and she will pass on to Ayden. PLEASE NOTE: All timestamps contained within this report are represented as [place] Standard Time. CONFIDENTIALTY NOTICE: This fax transmission is intended only for the addressee. It contains information that is legally privileged, confidential or otherwise protected from use or disclosure. If you are not the intended recipient, you are strictly prohibited from reviewing, disclosing, copying using or disseminating any of this information or taking any action in reliance on or regarding this information. If you have received this fax in error, please notify us immediately by telephone so that we can arrange for its return to [place]. Phone: [phone], Toll-Free: [phone], Fax: [phone] Page: 2 of 2 Call Id: [id number] [place]. Time Eilene Ghazi Time) Disposition Final User 05/21/2014 1:17:06 PM Call Completed Chancy Hurter 05/21/2014 1:07:14 PM Call EMS 911 Now Yes Julien Girt, RN, Dagoberto Reef Understands: Yes Disagree/Comply: Disagree Disagree/Comply Reason: Disagree with instructions Care  Advice Given Per Guideline CALL EMS 911 NOW: Immediate medical attention is needed.  You need to hang up and call 911 (or an ambulance). Psychologist, forensic Discretion: I'll call you back in a few minutes to be sure you were able to reach them.) CARE ADVICE given per Chest Pain (Adult) guideline. After Care Instructions Given Call Event Type User Date / Time Description Comments User: Eloisa Northern, RN Date/Time Eilene Ghazi Time): 05/21/2014 1:14:36 PM Caller refuses 911, but states she will have a neighbor drive her to the ED. Stated she had an appt tomorrow, I advised that we could not determine whether she would need to keep that time, until she is evaluated today. She verbalized understanding, will cb as needed.

## 2014-05-21 NOTE — Progress Notes (Signed)
Patient Name: Kristin Kramer Date of Encounter: 05/21/2014  Primary Care Provider:  [name], MD Primary Cardiologist:  Johnny Bridge, MD   Chief Complaint  74 y/o female presents today 2/2 a 3 day h/o right sided chest wall pain.  Past Medical History   Past Medical History  Diagnosis Date  . Seasonal allergies     cats, dust, mold, roaches  . Depression     prior on lexapro, celexa, wellbutrin.  high cymbalta doses cause tremors  . Migraines   . 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 Arthritis   . Knee osteoarthritis 2013, 2014    bilateral s/p B TKR [facility])  . Post-operative nausea and vomiting   . History of pneumonia   . History of UTI     12/2011 - Treatment began preoperatively with CIPRO 500mg  BID  . Refusal of blood transfusions as patient is Jehovah's Witness   . Osteopenia   . HLD (hyperlipidemia)   . Vitamin D deficiency   . Abdominal pain, other specified site 10.2014    [facility] ER - mesenteric adenitis vs sclerosing mesenteritis vs nonspecific lymphadenitis  . H/O seasonal allergies   . 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 Isac Sarna)  . Abnormal ECG     a. left axis deviation;  b. 12/2011 Echo: EF 55-60%, no rwma, pasp 35mmHg.   Past Surgical History  Procedure Laterality Date  . Tonsillectomy and adenoidectomy  1945  . Abdominal hysterectomy  1980  . Bunionectomy  2013    R and L foot  . Cataract extraction  2013    R, pending L  . Cosmetic surgery  2002    face lift  . Eye surgery      bilateral cataract surgery  . 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 Breast enhancement surgery    . Mohs surgery  2011    R leg  . Colonoscopy  2007    no records received  . Dexa  06/2008    T -1.4 spine and hip  . Total knee arthroplasty Left 03/18/2013    Surgeon: Lorn Junes, MD    Allergies  Allergies  Allergen Reactions  . Lipitor [Atorvastatin] Other  (See Comments)    Cramps  . Other     NO BLOOD OR BLOOD PRODUCTS  . Pravastatin Other (See Comments)    Cramps     HPI  74 y/o female with the above pmh.  She was last seen in clinic in 06/2013.  She has a h/o abnl ecg (left axis) with nl echo in 2013.  She has no prior h/o CAD.  She is relatively active @ home and denies any h/o of palpitations, dyspnea, pnd, orthopnea, n, v, dizziness, syncope, edema, weight gain, or early satiety.  Since 3/14 however, she has been experiencing constant, 2/10, right sided chest discomfort, just above the right breast, that is worse with palpation, mvmt of her right arm, and changes in upper body position.  She denies any recent trauma.  She called her PCP earlier today to get an appt and was advised to present to the ED for evaluation.  She then called here and was scheduled.  She is currently in no acute distress and c/o 2/10 right chest pain.  Home Medications  Prior to Admission medications   Medication Sig Start Date End Date  Taking? Authorizing Provider  acetaminophen (TYLENOL) 325 MG tablet Take 650 mg by mouth every 6 (six) hours as needed. For pain   Yes Historical Provider, MD  calcium carbonate (OS-CAL) 600 MG TABS Take 600 mg by mouth 2 (two) times daily with a meal.   Yes Historical Provider, MD  cetirizine (ZYRTEC) 10 MG tablet Take 10 mg by mouth daily.   Yes Historical Provider, MD  Cholecalciferol (VITAMIN D-3) 5000 UNITS TABS Take 5,000 Units by mouth daily.   Yes Historical Provider, MD  DULoxetine (CYMBALTA) 20 MG capsule Take one capsule every day **MUST HAVE PHYSICAL FOR FURTHER REFILLS** 03/13/14  Yes [name], MD  LORazepam (ATIVAN) 0.5 MG tablet Take 0.5 tablets (0.25 mg total) by mouth at bedtime as needed. For sleep 02/19/14  Yes [name], MD  Probiotic Product (PROBIOTIC PO) Take by mouth daily.   Yes Historical Provider, MD    Review of Systems  Ongoing chest pain above right breast that is worse with palpation  and position changes.  She denies palpitations, dyspnea, pnd, orthopnea, n, v, dizziness, syncope, edema, weight gain, or early satiety.  All other systems reviewed and are otherwise negative except as noted above.  Physical Exam  VS:  BP 120/90 mmHg  Pulse 85  Ht 5\' 5"  (1.651 m)  Wt 160 lb (72.576 kg)  BMI 26.63 kg/m2 , BMI Body mass index is 26.63 kg/(m^2). GEN: Well nourished, well developed, in no acute distress. HEENT: normal. Neck: Supple, no JVD, carotid bruits, or masses. Cardiac: RRR, no murmurs, rubs, or gallops. No clubbing, cyanosis, edema.  Radials/DP/PT 2+ and equal bilaterally.  Chest wall:  Tenderness noted with superficial palpation above right breast.  No evidence of rash.  Also worse with internal/external rotation, ab/adduction of right shoulder. Respiratory:  Respirations regular and unlabored, clear to auscultation bilaterally. GI: Soft, nontender, nondistended, BS + x 4. MS: no deformity or atrophy. Skin: warm and dry, no rash. Neuro:  Strength and sensation are intact. Psych: Normal affect.   Accessory Clinical Findings  ECG - rsr, 84, lad, no acute st/t changes.  Assessment & Plan  1.  Chest wall pain:  Pt presents with a 2+ day h/o constant right sided chest wall pain that is worse with palpation and position changes.  No associated dyspnea or pleuritic pain.  ECG is non-acute.  This is clearly musculoskeletal.  Tylenol hasn't helped much in keeping the discomfort @ bay.  She does indicate that ibuprofen has previously caused stomach upset.  I've recommended a short course of aleve 1 tab bid x 5 days.  I've also recommended that she use omeprazole OTC 20 mg daily while using aleve.  I offered her a cxr to evaluate for possible rib fx (no recent trauma) but she wishes to defer unless symptoms don't improve w/o nsaid therapy.  Finally, I advised that if discomfort persists over the weekend, she should f/u with her PCP next week.  Murray Hodgkins, NP 05/21/2014,  2:30 PM

## 2014-05-22 ENCOUNTER — Ambulatory Visit: Payer: Medicare Other | Admitting: Family Medicine

## 2014-05-22 NOTE — Telephone Encounter (Signed)
I'm glad pt did not go to ER and that she was able to be seen. Does not sound cardiac

## 2014-06-13 ENCOUNTER — Other Ambulatory Visit: Payer: Self-pay | Admitting: Family Medicine

## 2014-06-24 NOTE — Op Note (Signed)
PATIENT NAMEMozel, Kristin Kramer MR#:  [id number] DATE OF BIRTH:  [birth year]  DATE OF PROCEDURE:  12/20/2011  PREOPERATIVE DIAGNOSIS: Visually significant cataract of the left eye.   POSTOPERATIVE DIAGNOSIS: Visually significant cataract of the left eye.   OPERATIVE PROCEDURE: Cataract extraction by phacoemulsification with implant of intraocular lens to left eye.   SURGEON: Birder Robson, MD.   ANESTHESIA:  1. Managed anesthesia care.  2. Topical tetracaine drops followed by 2% Xylocaine jelly applied in the preoperative holding area.   COMPLICATIONS: None.   TECHNIQUE:  Stop-and-chop    DESCRIPTION OF PROCEDURE: The patient was examined and consented in the preoperative holding area where the aforementioned topical anesthesia was applied to the left eye and then brought back to the Operating Room where the left eye was prepped and draped in the usual sterile ophthalmic fashion and a lid speculum was placed. A paracentesis was created with the side port blade and the anterior chamber was filled with viscoelastic. A near clear corneal incision was performed with the steel keratome. A continuous curvilinear capsulorrhexis was performed with a cystotome followed by the capsulorrhexis forceps. Hydrodissection and hydrodelineation were carried out with BSS on a blunt cannula. The lens was removed in a stop-and-chop technique and the remaining cortical material was removed with the irrigation-aspiration handpiece. The capsular bag was inflated with viscoelastic and the Tecnis ZCB00 24.0-diopter lens, serial number [id number] was placed in the capsular bag without complication. The remaining viscoelastic was removed from the eye with the irrigation-aspiration handpiece. The wounds were hydrated. The anterior chamber was flushed with Miostat and the eye was inflated to physiologic pressure. The wounds were found to be water tight. The eye was dressed with Vigamox. The patient was given protective glasses  to wear throughout the day and a shield with which to sleep tonight. The patient was also given drops with which to begin a drop regimen today and will follow-up with me in one day.   ____________________________ Kristin Kramer. Arben Packman, MD wlp:drc D: 12/20/2011 11:49:28 ET T: 12/20/2011 11:52:47 ET JOB#: [id number]  cc: Jearldine Cassady L. Leven Hoel, MD, <Dictator> Kristin Kramer Zyann Mabry MD ELECTRONICALLY SIGNED 12/26/2011 13:46

## 2014-06-24 NOTE — Op Note (Signed)
PATIENT NAMEAdilene, Kristin Kramer MR#:  [id number] DATE OF BIRTH:  [birth year]  DATE OF PROCEDURE:  11/22/2011  PREOPERATIVE DIAGNOSIS: Visually significant cataract of the right eye.   POSTOPERATIVE DIAGNOSIS: Visually significant cataract of the right eye.   OPERATIVE PROCEDURE: Cataract extraction by phacoemulsification with implant of intraocular lens to right eye.   SURGEON: Birder Robson, MD.   ANESTHESIA:  1. Managed anesthesia care.  2. Topical tetracaine drops followed by 2% Xylocaine jelly applied in the preoperative holding area.   COMPLICATIONS: None.   TECHNIQUE:  Stop and chop.   DESCRIPTION OF PROCEDURE: The patient was examined and consented in the preoperative holding area where the aforementioned topical anesthesia was applied to the right eye and then brought back to the Operating Room where the right eye was prepped and draped in the usual sterile ophthalmic fashion and a lid speculum was placed. A paracentesis was created with the side port blade and the anterior chamber was filled with viscoelastic. A near clear corneal incision was performed with the steel keratome. A continuous curvilinear capsulorrhexis was performed with a cystotome followed by the capsulorrhexis forceps. Hydrodissection and hydrodelineation were carried out with BSS on a blunt cannula. The lens was removed in a stop and chop technique and the remaining cortical material was removed with the irrigation-aspiration handpiece. The capsular bag was inflated with viscoelastic and the Technis ZCB00 23.5-diopter lens, serial number [id number] was placed in the capsular bag without complication. The remaining viscoelastic was removed from the eye with the irrigation-aspiration handpiece. The wounds were hydrated. The anterior chamber was flushed with Miostat and the eye was inflated to physiologic pressure. The wounds were found to be water tight. The eye was dressed with Vigamox. The patient was given protective  glasses to wear throughout the day and a shield with which to sleep tonight. The patient was also given drops with which to begin a drop regimen today and will follow-up with me in one day.    ____________________________ Livingston Diones. Kateena Degroote, MD wlp:ap D: 11/22/2011 12:31:05 ET T: 11/22/2011 14:03:57 ET JOB#: [id number]  cc: Jadin Creque L. Margarite Vessel, MD, <Dictator> Livingston Diones Mahdiya Mossberg MD ELECTRONICALLY SIGNED 12/26/2011 13:45

## 2014-07-03 ENCOUNTER — Telehealth: Payer: Self-pay | Admitting: Family Medicine

## 2014-07-03 NOTE — Telephone Encounter (Signed)
[place] [facility] Patient Name: [facility] DOB: Sep 23, 1940 Initial Comment caller states she has a tick on her and doesn't know how to remove it Nurse Assessment Nurse: Erlene Quan, RN, Manuela Schwartz Date/Time Eilene Ghazi Time): 07/03/2014 11:04:40 AM Confirm and document reason for call. If symptomatic, describe symptoms. ---caller states she has a tick on her and doesn't know how to remove it - she believe it was there since yesterday - its on her stomach - there is enough for her to get with tweezers but she has not tried she was afraid to - she tried putting alcohol on a cotton ball but that didn't do anything Has the patient traveled out of the country within the last 30 days? ---Not Applicable Does the patient require triage? ---Yes Related visit to physician within the last 2 weeks? ---No Does the PT have any chronic conditions? (i.e. diabetes, asthma, etc.) ---No Guidelines Guideline Title Affirmed Question Affirmed Notes Tick Bite Tick bite with no complications (all triage questions negative) Final Disposition User Gross, RN, Manuela Schwartz Comments was able to instruct caller to use the tweezers to grab as much tick as possible with her tweezers and pull straight up - advised not to let go hold on until she has pulled it off and it may be a little difficult - caller states she was able to pull the tick off with tweezers - states she got the entire tick and it is still alive - advised to wash her hands and wash the area with antibacterial soap ally Neospiorin and continue to do that until it has scabbed over - advised to monitor for - FEVER - RASH or INFECTION - if any of those develop call us back right away - states she is going to put the tick in a container just in case it is needed

## 2014-07-09 DIAGNOSIS — Z85828 Personal history of other malignant neoplasm of skin: Secondary | ICD-10-CM | POA: Diagnosis not present

## 2014-07-09 DIAGNOSIS — D485 Neoplasm of uncertain behavior of skin: Secondary | ICD-10-CM | POA: Diagnosis not present

## 2014-07-09 DIAGNOSIS — Z419 Encounter for procedure for purposes other than remedying health state, unspecified: Secondary | ICD-10-CM | POA: Diagnosis not present

## 2014-07-09 DIAGNOSIS — C44722 Squamous cell carcinoma of skin of right lower limb, including hip: Secondary | ICD-10-CM | POA: Diagnosis not present

## 2014-07-10 ENCOUNTER — Other Ambulatory Visit: Payer: Self-pay | Admitting: Family Medicine

## 2014-07-16 ENCOUNTER — Encounter: Payer: Self-pay | Admitting: Family Medicine

## 2014-07-24 ENCOUNTER — Encounter: Payer: Self-pay | Admitting: Family Medicine

## 2014-07-29 ENCOUNTER — Other Ambulatory Visit (INDEPENDENT_AMBULATORY_CARE_PROVIDER_SITE_OTHER): Payer: Medicare Other

## 2014-07-29 ENCOUNTER — Other Ambulatory Visit: Payer: Self-pay | Admitting: Family Medicine

## 2014-07-29 DIAGNOSIS — E785 Hyperlipidemia, unspecified: Secondary | ICD-10-CM | POA: Diagnosis not present

## 2014-07-29 DIAGNOSIS — R7401 Elevation of levels of liver transaminase levels: Secondary | ICD-10-CM

## 2014-07-29 DIAGNOSIS — D62 Acute posthemorrhagic anemia: Secondary | ICD-10-CM | POA: Diagnosis not present

## 2014-07-29 DIAGNOSIS — R740 Nonspecific elevation of levels of transaminase and lactic acid dehydrogenase [LDH]: Secondary | ICD-10-CM

## 2014-07-29 DIAGNOSIS — E559 Vitamin D deficiency, unspecified: Secondary | ICD-10-CM

## 2014-07-29 LAB — COMPREHENSIVE METABOLIC PANEL
ALT: 34 U/L (ref 0–35)
AST: 27 U/L (ref 0–37)
Albumin: 4.3 g/dL (ref 3.5–5.2)
Alkaline Phosphatase: 164 U/L — ABNORMAL HIGH (ref 39–117)
BILIRUBIN TOTAL: 0.4 mg/dL (ref 0.2–1.2)
BUN: 15 mg/dL (ref 6–23)
CO2: 31 mEq/L (ref 19–32)
Calcium: 9.7 mg/dL (ref 8.4–10.5)
Chloride: 103 mEq/L (ref 96–112)
Creatinine, Ser: 0.71 mg/dL (ref 0.40–1.20)
GFR: 85.66 mL/min (ref >60.00)
Glucose, Bld: 105 mg/dL — ABNORMAL HIGH (ref 70–99)
Potassium: 4.2 mEq/L (ref 3.5–5.1)
Sodium: 140 mEq/L (ref 135–145)
Total Protein: 7.6 g/dL (ref 6.0–8.3)

## 2014-07-29 LAB — CBC WITH DIFFERENTIAL/PLATELET
BASOS ABS: 0 10*3/uL (ref 0.0–0.1)
Basophils Relative: 0.7 % (ref 0.0–3.0)
EOS PCT: 6.8 % — AB (ref 0.0–5.0)
Eosinophils Absolute: 0.4 10*3/uL (ref 0.0–0.7)
HCT: 40.8 % (ref 36.0–46.0)
Hemoglobin: 13.8 g/dL (ref 12.0–15.0)
Lymphocytes Relative: 27.4 % (ref 12.0–46.0)
Lymphs Abs: 1.5 10*3/uL (ref 0.7–4.0)
MCHC: 33.8 g/dL (ref 30.0–36.0)
MCV: 85.9 fl (ref 78.0–100.0)
MONOS PCT: 10.1 % (ref 3.0–12.0)
Monocytes Absolute: 0.5 10*3/uL (ref 0.1–1.0)
Neutro Abs: 3 10*3/uL (ref 1.4–7.7)
Neutrophils Relative %: 55 % (ref 43.0–77.0)
Platelets: 288 10*3/uL (ref 150.0–400.0)
RBC: 4.75 Mil/uL (ref 3.87–5.11)
RDW: 13.3 % (ref 11.5–15.5)
WBC: 5.4 10*3/uL (ref 4.0–10.5)

## 2014-07-29 LAB — LIPID PANEL
CHOL/HDL RATIO: 4
Cholesterol: 252 mg/dL — ABNORMAL HIGH (ref 0–200)
HDL: 58.4 mg/dL (ref >39.00)
LDL CALC: 167 mg/dL — AB (ref 0–99)
NONHDL: 193.6
TRIGLYCERIDES: 133 mg/dL (ref 0.0–149.0)
VLDL: 26.6 mg/dL (ref 0.0–40.0)

## 2014-07-29 LAB — VITAMIN D 25 HYDROXY (VIT D DEFICIENCY, FRACTURES): VITD: 44.05 ng/mL (ref 30.00–100.00)

## 2014-07-31 ENCOUNTER — Encounter: Payer: Self-pay | Admitting: *Deleted

## 2014-07-31 ENCOUNTER — Ambulatory Visit (INDEPENDENT_AMBULATORY_CARE_PROVIDER_SITE_OTHER): Payer: Medicare Other | Admitting: Family Medicine

## 2014-07-31 ENCOUNTER — Encounter: Payer: Self-pay | Admitting: Physician Assistant

## 2014-07-31 ENCOUNTER — Encounter: Payer: Self-pay | Admitting: Family Medicine

## 2014-07-31 VITALS — BP 130/88 | HR 76 | Temp 98.1°F | Ht 64.75 in | Wt 157.0 lb

## 2014-07-31 DIAGNOSIS — R197 Diarrhea, unspecified: Secondary | ICD-10-CM

## 2014-07-31 DIAGNOSIS — F32A Depression, unspecified: Secondary | ICD-10-CM

## 2014-07-31 DIAGNOSIS — F419 Anxiety disorder, unspecified: Secondary | ICD-10-CM | POA: Diagnosis not present

## 2014-07-31 DIAGNOSIS — E785 Hyperlipidemia, unspecified: Secondary | ICD-10-CM

## 2014-07-31 DIAGNOSIS — F329 Major depressive disorder, single episode, unspecified: Secondary | ICD-10-CM

## 2014-07-31 DIAGNOSIS — Z23 Encounter for immunization: Secondary | ICD-10-CM

## 2014-07-31 DIAGNOSIS — R740 Nonspecific elevation of levels of transaminase and lactic acid dehydrogenase [LDH]: Secondary | ICD-10-CM

## 2014-07-31 DIAGNOSIS — Z7189 Other specified counseling: Secondary | ICD-10-CM | POA: Insufficient documentation

## 2014-07-31 DIAGNOSIS — Z0000 Encounter for general adult medical examination without abnormal findings: Secondary | ICD-10-CM | POA: Diagnosis not present

## 2014-07-31 DIAGNOSIS — M8580 Other specified disorders of bone density and structure, unspecified site: Secondary | ICD-10-CM

## 2014-07-31 DIAGNOSIS — E559 Vitamin D deficiency, unspecified: Secondary | ICD-10-CM

## 2014-07-31 DIAGNOSIS — R7401 Elevation of levels of liver transaminase levels: Secondary | ICD-10-CM

## 2014-07-31 DIAGNOSIS — R1084 Generalized abdominal pain: Secondary | ICD-10-CM

## 2014-07-31 MED ORDER — DULOXETINE HCL 20 MG PO CPEP: ORAL_CAPSULE | ORAL | Status: DC

## 2014-07-31 MED ORDER — LORAZEPAM 0.5 MG PO TABS: 0.2500 mg | ORAL_TABLET | Freq: Every evening | ORAL | Status: DC | PRN

## 2014-07-31 NOTE — Patient Instructions (Addendum)
prevnar today. Sign up front release form for records of bone density and colonoscopy done at [facility]. Bring me copy of advanced directive at your convenience. For abdominal discomfort/diarrhea - stool studies today. We will also set you up for abdominal ultrasound. We will refer you to GI for further evaluation.  Return as needed or in 1 year for next medicare wellness visit

## 2014-07-31 NOTE — Assessment & Plan Note (Addendum)
Abd pain with cramping, with intermittent diarrheal episodes over the past year.  No red flags of fever, weight loss noted today. labwork reassuring except for nonspecific elevation of ALP. Check stool studies, abd [place], refer to GI. Pt agrees with plan. ?IBS. Per pt colonoscopy normal 2009 - will request records today.

## 2014-07-31 NOTE — Assessment & Plan Note (Signed)
Will try to obtain latest dexa from [facility]

## 2014-07-31 NOTE — Progress Notes (Signed)
Pre visit review using our clinic review tool, if applicable. No additional management support is needed unless otherwise documented below in the visit note. 

## 2014-07-31 NOTE — Assessment & Plan Note (Signed)
Advanced directives - Chauncey Reading is son Saralyn Pilar. Will bring me copy.

## 2014-07-31 NOTE — Assessment & Plan Note (Signed)
ALP remains elevated, with mild RUQ pain on exam today and bowel changes. Recheck abd [place] today.

## 2014-07-31 NOTE — Assessment & Plan Note (Signed)
Level at goal.

## 2014-07-31 NOTE — Assessment & Plan Note (Signed)
Stable. Continue cymbalta 20mg  daily.

## 2014-07-31 NOTE — Progress Notes (Signed)
BP 130/88 mmHg  Pulse 76  Temp(Src) 98.1 F (36.7 C) (Oral)  Ht 5' 4.75" (1.645 m)  Wt 157 lb (71.215 kg)  BMI 26.32 kg/m2  SpO2 97%   CC: medicare wellness visit  Subjective:    Patient ID: Kristin Kramer, female    DOB: 1940-12-21, 74 y.o.   MRN: [id number]  HPI: Kristin Kramer is a 74 y.o. female presenting on 07/31/2014 for Annual Exam   Noticing increasing diarrhea over last 7-8 months. Some alternating with constipation. + general abd pain, cramping, gassiness, bloating. sxs happen 1-2x/wk, last 1 day. Pencil thin stool at times, + mucous. Loose to watery, no blood. No nausea/vomiting. Some stool urgency and incontinence. Hasn't found any inciting foods. H/o IBS but not severe. No recent travel, no new suspicious foods. No recent abx use. At times wakes up with stool urgency. No GERD. 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  HLD - intolerant of statins and even RYR.  Passes hearing screens Recent eye exam at eye doctor 1 fall 2 wks ago - while walking on uneven pavement. Banged up - bruised forehead and arms and knees.  Denies depression/anhedonia/sadness.   Preventative: Colonoscopy 2009 - WNL, good for 10 yrs according to patient, done in [facility].  Well woman exam - 2012. Prior PCP. Hysterectomy for fibroids, ovaries remained. Menopause early 20s.  DEXA - thinks done 2012 - thinks osteopenia. mammo 2014, due to reschedule,  Flu shot yearly Pneumovax 2013, prevnar today Td unsure. zostavax - 02/2013 Advanced directives - Chauncey Reading is son Saralyn Pilar. Will bring me copy. Seat belt use discussed Sunscreen use discussed  Lives alone. Brother lives nearby.  Occupation: retired, was Network engineer and housewife  Edu: 1 yr college  Activity: limited by knee osteoarthritis Diet: good water, fruits/vegetables daily   Relevant past medical, surgical, family and social history reviewed and updated as  indicated. Interim medical history since our last visit reviewed. Allergies and medications reviewed and updated. Current Outpatient Prescriptions on File Prior to Visit  Medication Sig  . acetaminophen (TYLENOL) 325 MG tablet Take 650 mg by mouth every 6 (six) hours as needed. For pain  . calcium carbonate (OS-CAL) 600 MG TABS Take 600 mg by mouth 2 (two) times daily with a meal.  . cetirizine (ZYRTEC) 10 MG tablet Take 10 mg by mouth daily.  . Cholecalciferol (VITAMIN D-3) 5000 UNITS TABS Take 5,000 Units by mouth daily.  . Probiotic Product (PROBIOTIC PO) Take by mouth daily.   No current facility-administered medications on file prior to visit.    Review of Systems Per HPI unless specifically indicated above Specifically denies pelvic pain/fullness, vaginal or vulvar skin changes.    Objective:    BP 130/88 mmHg  Pulse 76  Temp(Src) 98.1 F (36.7 C) (Oral)  Ht 5' 4.75" (1.645 m)  Wt 157 lb (71.215 kg)  BMI 26.32 kg/m2  SpO2 97%  Wt Readings from Last 3 Encounters:  07/31/14 157 lb (71.215 kg)  05/21/14 160 lb (72.576 kg)  02/19/14 156 lb 8 oz (70.988 kg)    Physical Exam  Constitutional: She is oriented to person, place, and time. She appears well-developed and well-nourished. No distress.  HENT:  Head: Normocephalic and atraumatic.  Right Ear: Hearing, tympanic membrane, external ear and ear canal normal.  Left Ear: Hearing, tympanic membrane, external ear and ear canal normal.  Nose: Nose normal.  Mouth/Throat: Uvula is  midline, oropharynx is clear and moist and mucous membranes are normal. No oropharyngeal exudate, posterior oropharyngeal edema or posterior oropharyngeal erythema.  Eyes: Conjunctivae and EOM are normal. Pupils are equal, round, and reactive to light. No scleral icterus.  Neck: Normal range of motion. Neck supple. Carotid bruit is not present. No thyromegaly present.  Cardiovascular: Normal rate, regular rhythm, normal heart sounds and intact distal  pulses.   No murmur heard. Pulses:      Radial pulses are 2+ on the right side, and 2+ on the left side.  Pulmonary/Chest: Effort normal and breath sounds normal. No respiratory distress. She has no wheezes. She has no rales.  Breast exam - declined  Abdominal: Soft. Normal appearance and bowel sounds are normal. She exhibits no distension and no mass. There is no hepatosplenomegaly. There is tenderness (mild) in the right upper quadrant. There is no rebound, no guarding and no CVA tenderness.  Genitourinary:  GYN - deferred  Musculoskeletal: Normal range of motion. She exhibits no edema.  Lymphadenopathy:    She has no cervical adenopathy.  Neurological: She is alert and oriented to person, place, and time.  CN grossly intact, station and gait intact Recall 3/3 Calculation 3/5 serial 3s, 5/5 D-L-R-O-W  Skin: Skin is warm and dry. No rash noted.  Psychiatric: She has a normal mood and affect. Her behavior is normal. Judgment and thought content normal.  Nursing note and vitals reviewed.  Results for orders placed or performed in visit on 07/29/14  CBC with Differential/Platelet  Result Value Ref Range   WBC 5.4 4.0 - 10.5 K/uL   RBC 4.75 3.87 - 5.11 Mil/uL   Hemoglobin 13.8 12.0 - 15.0 g/dL   HCT 40.8 36.0 - 46.0 %   MCV 85.9 78.0 - 100.0 fl   MCHC 33.8 30.0 - 36.0 g/dL   RDW 13.3 11.5 - 15.5 %   Platelets 288.0 150.0 - 400.0 K/uL   Neutrophils Relative % 55.0 43.0 - 77.0 %   Lymphocytes Relative 27.4 12.0 - 46.0 %   Monocytes Relative 10.1 3.0 - 12.0 %   Eosinophils Relative 6.8 (H) 0.0 - 5.0 %   Basophils Relative 0.7 0.0 - 3.0 %   Neutro Abs 3.0 1.4 - 7.7 K/uL   Lymphs Abs 1.5 0.7 - 4.0 K/uL   Monocytes Absolute 0.5 0.1 - 1.0 K/uL   Eosinophils Absolute 0.4 0.0 - 0.7 K/uL   Basophils Absolute 0.0 0.0 - 0.1 K/uL  Comprehensive metabolic panel  Result Value Ref Range   Sodium 140 135 - 145 mEq/L   Potassium 4.2 3.5 - 5.1 mEq/L   Chloride 103 96 - 112 mEq/L   CO2 31 19 -  32 mEq/L   Glucose, Bld 105 (H) 70 - 99 mg/dL   BUN 15 6 - 23 mg/dL   Creatinine, Ser 0.71 0.40 - 1.20 mg/dL   Total Bilirubin 0.4 0.2 - 1.2 mg/dL   Alkaline Phosphatase 164 (H) 39 - 117 U/L   AST 27 0 - 37 U/L   ALT 34 0 - 35 U/L   Total Protein 7.6 6.0 - 8.3 g/dL   Albumin 4.3 3.5 - 5.2 g/dL   Calcium 9.7 8.4 - 10.5 mg/dL   GFR 85.66 >60.00 mL/min  Lipid panel  Result Value Ref Range   Cholesterol 252 (H) 0 - 200 mg/dL   Triglycerides 133.0 0.0 - 149.0 mg/dL   HDL 58.40 >39.00 mg/dL   VLDL 26.6 0.0 - 40.0 mg/dL   LDL Cholesterol  167 (H) 0 - 99 mg/dL   Total CHOL/HDL Ratio 4    NonHDL 193.60   Vit D  25 hydroxy (rtn osteoporosis monitoring)  Result Value Ref Range   VITD 44.05 30.00 - 100.00 ng/mL      Assessment & Plan:   Problem List Items Addressed This Visit    Abdominal pain    Abd pain with cramping, with intermittent diarrheal episodes over the past year.  No red flags of fever, weight loss noted today. labwork reassuring except for nonspecific elevation of ALP. Check stool studies, abd [place], refer to GI. Pt agrees with plan. ?IBS. Per pt colonoscopy normal 2009 - will request records today.      Relevant Orders   Ambulatory referral to Gastroenterology   Stool culture   C. difficile GDH and Toxin A/B   US Abdomen Complete   Advanced care planning/counseling discussion    Advanced directives - Chauncey Reading is son Saralyn Pilar. Will bring me copy.      Anxiety    Discussed controlled substances. She rarely used ativan for sleep. She has decided not to refill ativan and may use prn benadryl/melatonin if issues develop      Relevant Medications   DULoxetine (CYMBALTA) 20 MG capsule   Depression    Stable. Continue cymbalta 20mg  daily.      Relevant Medications   DULoxetine (CYMBALTA) 20 MG capsule   Diarrhea    See above. Check stool studies, refer to GI.      Relevant Orders   Stool culture   C. difficile GDH and Toxin A/B   US Abdomen Complete   HLD  (hyperlipidemia)    Chronic, intolerant to statins in the past. Suggested lower RYR dose (1tab bid - was taking 2 tab bid).      Medicare annual wellness visit, subsequent - Primary    I have personally reviewed the Medicare Annual Wellness questionnaire and have noted 1. The patient's medical and social history 2. Their use of alcohol, tobacco or illicit drugs 3. Their current medications and supplements 4. The patient's functional ability including ADL's, fall risks, home safety risks and hearing or visual impairment. 5. Diet and physical activity 6. Evidence for depression or mood disorders The patients weight, height, BMI have been recorded in the chart.  Hearing and vision has been addressed. I have made referrals, counseling and provided education to the patient based review of the above and I have provided the pt with a written personalized care plan for preventive services. Provider list updated - see scanned questionairre. Reviewed preventative protocols and updated unless pt declined.       Osteopenia    Will try to obtain latest dexa from [facility]      Transaminitis    ALP remains elevated, with mild RUQ pain on exam today and bowel changes. Recheck abd [place] today.       Relevant Orders   US Abdomen Complete   Vitamin D deficiency    Level at goal.          Follow up plan: Return in about 1 year (around 07/31/2015), or as needed, for follow up visit.

## 2014-07-31 NOTE — Assessment & Plan Note (Signed)
See above. Check stool studies, refer to GI.

## 2014-07-31 NOTE — Assessment & Plan Note (Signed)

## 2014-07-31 NOTE — Assessment & Plan Note (Signed)
Chronic, intolerant to statins in the past. Suggested lower RYR dose (1tab bid - was taking 2 tab bid).

## 2014-07-31 NOTE — Addendum Note (Signed)
Addended by: Vernetta Honey on: 07/31/2014 12:50 PM   Modules accepted: Orders

## 2014-07-31 NOTE — Assessment & Plan Note (Signed)
Discussed controlled substances. She rarely used ativan for sleep. She has decided not to refill ativan and may use prn benadryl/melatonin if issues develop

## 2014-08-02 DIAGNOSIS — R350 Frequency of micturition: Secondary | ICD-10-CM | POA: Diagnosis not present

## 2014-08-02 DIAGNOSIS — N390 Urinary tract infection, site not specified: Secondary | ICD-10-CM | POA: Diagnosis not present

## 2014-08-06 DIAGNOSIS — D126 Benign neoplasm of colon, unspecified: Secondary | ICD-10-CM

## 2014-08-06 HISTORY — PX: COLONOSCOPY: SHX174

## 2014-08-06 HISTORY — DX: Benign neoplasm of colon, unspecified: D12.6

## 2014-08-06 NOTE — Addendum Note (Signed)
Addended by: Ellamae Sia on: 08/06/2014 02:23 PM   Modules accepted: Orders

## 2014-08-11 ENCOUNTER — Ambulatory Visit
Admission: RE | Admit: 2014-08-11 | Discharge: 2014-08-11 | Disposition: A | Discharge status: Home / Self Care | Payer: Medicare Other | Source: Ambulatory Visit | Attending: Family Medicine | Admitting: Family Medicine

## 2014-08-11 DIAGNOSIS — R740 Nonspecific elevation of levels of transaminase and lactic acid dehydrogenase [LDH]: Secondary | ICD-10-CM

## 2014-08-11 DIAGNOSIS — R197 Diarrhea, unspecified: Secondary | ICD-10-CM | POA: Diagnosis not present

## 2014-08-11 DIAGNOSIS — R1010 Upper abdominal pain, unspecified: Secondary | ICD-10-CM | POA: Diagnosis not present

## 2014-08-11 DIAGNOSIS — R7401 Elevation of levels of liver transaminase levels: Secondary | ICD-10-CM

## 2014-08-11 DIAGNOSIS — R1084 Generalized abdominal pain: Secondary | ICD-10-CM

## 2014-08-13 ENCOUNTER — Telehealth: Payer: Self-pay | Admitting: Family Medicine

## 2014-08-13 NOTE — Telephone Encounter (Signed)
Dr Darnell Level is out of office this afternoon.Please advise.

## 2014-08-13 NOTE — Telephone Encounter (Signed)
[place] [facility] Patient Name: [facility] DOB: 30-Mar-1940 Initial Comment Caller states c/o urination pressure and frequency Nurse Assessment Nurse: Erlene Quan, RN, Manuela Schwartz Date/Time Eilene Ghazi Time): 08/13/2014 2:09:26 PM Confirm and document reason for call. If symptomatic, describe symptoms. ---Caller states c/o urination pressure and frequency - she was seen back May 28th at a [facility] by [facility] in [facility] - phone number [phone] and was given 4 days of sulfa medication and she told her they were sending off a culture but she never got a call back about it - states the symptoms never really went away - so she thinks she needs more medications - she was just in for her check up the day before she left to go on vacation - no allergies - maybe [facility] can [facility] and see if they ever got the culture back Has the patient traveled out of the country within the last 30 days? ---Not Applicable Does the patient require triage? ---Declined Triage Please document clinical information provided and list any resource used. ---Advised caller I will have to send a message to the office and she will receive a call back regarding her request Guidelines Guideline Title Affirmed Question Affirmed Notes Final Disposition User Clinical Call Erlene Quan, RN, Manuela Schwartz

## 2014-08-14 NOTE — Telephone Encounter (Signed)
[facility] and was able to request the results be faxed to me--Left detailed msg on VM per HIPAA letting pt know we are awaiting results to be faxed--will place in your inbox once received

## 2014-08-14 NOTE — Telephone Encounter (Signed)
Agree - will await culture results.

## 2014-08-14 NOTE — Telephone Encounter (Signed)
Called number given for the [facility] but there was no answer

## 2014-08-14 NOTE — Telephone Encounter (Signed)
Mel,  Can you call to see if you can get culture results? It may be faster to see if pt can come in for follow up appt

## 2014-08-19 ENCOUNTER — Ambulatory Visit (INDEPENDENT_AMBULATORY_CARE_PROVIDER_SITE_OTHER): Payer: Medicare Other | Admitting: Physician Assistant

## 2014-08-19 ENCOUNTER — Encounter: Payer: Self-pay | Admitting: Physician Assistant

## 2014-08-19 ENCOUNTER — Other Ambulatory Visit (INDEPENDENT_AMBULATORY_CARE_PROVIDER_SITE_OTHER): Payer: Medicare Other

## 2014-08-19 VITALS — BP 136/80 | HR 76 | Ht 65.0 in | Wt 159.8 lb

## 2014-08-19 DIAGNOSIS — R1032 Left lower quadrant pain: Secondary | ICD-10-CM

## 2014-08-19 DIAGNOSIS — R197 Diarrhea, unspecified: Secondary | ICD-10-CM

## 2014-08-19 DIAGNOSIS — R1031 Right lower quadrant pain: Secondary | ICD-10-CM | POA: Diagnosis not present

## 2014-08-19 LAB — C-REACTIVE PROTEIN: CRP: 1.1 mg/dL (ref 0.5–20.0)

## 2014-08-19 LAB — IGA: IGA: 271 mg/dL (ref 68–378)

## 2014-08-19 LAB — SEDIMENTATION RATE: SED RATE: 26 mm/h — AB (ref 0–22)

## 2014-08-19 MED ORDER — METRONIDAZOLE 250 MG PO TABS: 250.0000 mg | ORAL_TABLET | Freq: Four times a day (QID) | ORAL | Status: AC

## 2014-08-19 MED ORDER — NA SULFATE-K SULFATE-MG SULF 17.5-3.13-1.6 GM/177ML PO SOLN: 1.0000 | Freq: Once | ORAL | Status: AC

## 2014-08-19 NOTE — Patient Instructions (Addendum)
You have been scheduled for a colonoscopy. Please follow written instructions given to you at your visit today.  Please pick up your prep supplies at the pharmacy within the next 1-3 days. Diaz. We also sent prescription for Flagyl. Take 1 by mouth 4 times daily until finished. If you use inhalers (even only as needed), please bring them with you on the day of your procedure. Your physician has requested that you go to www.startemmi.com and enter the access code given to you at your visit today. This web site gives a general overview about your procedure. However, you should still follow specific instructions given to you by our office regarding your preparation for the procedure.

## 2014-08-19 NOTE — Progress Notes (Signed)
Patient ID: Kristin Kramer, female   DOB: Nov 19, 1940, 74 y.o.   MRN: [id number]   Subjective:    Patient ID: Kristin Kramer, female    DOB: [birth year], 74 y.o.   MRN: [id number]  HPI Kristin Kramer is a pleasant 74 year old white female new to GI today referred by Dr. [name] for evaluation of abdominal pain and diarrhea. Patient relates that [place] that this was normal. She had an upper abdominal ultrasound done within the past couple of months also read as normal. She states that her current symptoms started about 6 months ago and she has had progression of crampy lower abdominal pain she says gets bad at times and frequent diarrheal stools. Patient says is a very unusual for her because she has always had problems with Crohn's constant chronic constipation. She says she is not having any normal bowel movements and on bad days will have diarrhea off and on all day and also has nighttime episodes of diarrhea with extreme urgency. On good days she has perhaps 3-4 loose stools. She has seen mucus but no blood. Her fever or chills. Her appetite has been okay and her weight has been stable. She's not been on any recent antibiotics that she can recall no recent travel. Her only new medication was magnesium which she started at some point last fall because of constipation. Most recent labs were reviewed and are unremarkable within the exception of an alkaline phosphatase elevated at 164.  Review of Systems Pertinent positive and negative review of systems were noted in the above HPI section.  All other review of systems was otherwise negative.  Outpatient Encounter Prescriptions as of 08/19/2014  Medication Sig  . acetaminophen (TYLENOL) 325 MG tablet Take 650 mg by mouth every 6 (six) hours as needed. For pain  . calcium carbonate (OS-CAL) 600 MG TABS Take 600 mg by mouth 2 (two) times daily with a meal.  . cetirizine (ZYRTEC) 10 MG tablet Take 10  mg by mouth daily.  . Cholecalciferol (VITAMIN D-3) 5000 UNITS TABS Take 5,000 Units by mouth daily.  . Coenzyme Q10 (COQ-10 PO) Take by mouth.  . DULoxetine (CYMBALTA) 20 MG capsule Take one daily  . magnesium gluconate (MAGONATE) 500 MG tablet Take 500 mg by mouth daily.  . Probiotic Product (PROBIOTIC PO) Take by mouth daily.  . TURMERIC CURCUMIN PO Take by mouth.  . metroNIDAZOLE (FLAGYL) 250 MG tablet Take 1 tablet (250 mg total) by mouth 4 (four) times daily.  . Na Sulfate-K Sulfate-Mg Sulf SOLN Take 1 kit by mouth once.   No facility-administered encounter medications on file as of 08/19/2014.   Allergies  Allergen Reactions  . Lipitor [Atorvastatin] Other (See Comments)    Cramps  . Other     NO BLOOD OR BLOOD PRODUCTS  . Pravastatin Other (See Comments)    Cramps    Patient Active Problem List   Diagnosis Date Noted  . Advanced care planning/counseling discussion 07/31/2014  . Diarrhea 07/31/2014  . Chest wall pain 05/21/2014  . Herpes zoster 02/19/2014  . Constipation 04/06/2013  . Allergic rhinitis 04/06/2013  . Acute posthemorrhagic anemia 03/27/2013  . 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  . Complication of anesthesia   . Abnormal finding on EKG   . History of pneumonia   . Post-operative nausea and vomiting   . Refusal of blood transfusions as patient  is Jehovah's Witness   . Medicare annual wellness visit, subsequent 01/14/2013  . Hot flashes 01/14/2013  . Transaminitis 01/03/2013  . Abdominal pain 01/03/2013  . Osteopenia   . HLD (hyperlipidemia)   . Vitamin D deficiency   . Anxiety 01/05/2012  . Seasonal allergies   . Squamous cell skin cancer   . Right knee DJD   . Depression   . Migraines   . Abnormal EKG   . Arthritis    History   Social History  . Marital Status: Widowed    Spouse Name: N/A  . Number of Children: 3  . Years of Education: N/A   Occupational History  . Retired    Social  History Main Topics  . Smoking status: Never Smoker   . Smokeless tobacco: Never Used  . Alcohol Use: Yes     Comment: occasional  . Drug Use: No  . Sexual Activity: Not on file   Other Topics Concern  . Not on file   Social History Narrative   Lives alone.  Brother lives nearby.   Occupation: retired, was Network engineer and housewife   Edu: 1 yr college   Activity: limited by knee   Diet: good water, fruits/vegetables daily   Religion: Jehova's witness    Ms. Spiegelman's family history includes Cancer in her father and mother; Cancer (age of onset: 58) in her maternal grandmother; Coronary artery disease (age of onset: 22) in her paternal grandmother. There is no history of Diabetes or Stroke.      Objective:    Filed Vitals:   08/19/14 1028  BP: 136/80  Pulse: 76    Physical Exam  will developed older white female in no acute distress, pleasant blood pressure 136/80 pulse 76 height 5 foot 5 weight 159. HEENT; nontraumatic normocephalic EOMI PERRLA sclera anicteric, Supple ;no JVD, Cardiovascular; regular rate and rhythm with S1-S2 no murmur or gallop, Pulmonary; clear bilaterally, Abdomen; soft tender in the right mid quadrant and right upper quadrant and across the upper abdomen there is no guarding or rebound no palpable mass or hepatosplenomegaly bowel sounds are present, Rectal; exam not done, Extremities; no clubbing cyanosis or edema skin warm and dry, Psych; mood and affect appropriate       Assessment & Plan:   #1 74 yro female with 6 month hx of crampy abdominal  pain, and persistent diarrhea.  Etiology  is unclear- will r/o cdiff, need to r/o new onset IBD or microscopic colitis #2 DJD #3 Migraines  Plan; check ESR,CRP TTG/IGA, Gi path panel , lactoferrin  Start empiric Flagyl 350 mg 4 x daily x 2 weeks  Continue daily probiotic Stop magnesium supplement  Schedule for Colonoscopy with Dr. Fuller Plan- procedure discussed in detail with pt and she is agreeable to  proceed. If Gi Pathogen panel reveals infectious etiology  can cancel Colon.    Amy Genia Harold PA-C 08/19/2014   Cc: [name], MD

## 2014-08-20 LAB — TISSUE TRANSGLUTAMINASE, IGG: Tissue Transglut Ab: 1 U/mL (ref <6)

## 2014-08-20 NOTE — Progress Notes (Signed)
Reviewed and agree with management plan.  Berta Denson T. Danelly Hassinger, MD FACG 

## 2014-08-21 ENCOUNTER — Other Ambulatory Visit: Payer: Self-pay

## 2014-08-21 ENCOUNTER — Other Ambulatory Visit: Payer: Medicare Other

## 2014-08-21 ENCOUNTER — Ambulatory Visit (INDEPENDENT_AMBULATORY_CARE_PROVIDER_SITE_OTHER): Payer: Medicare Other | Admitting: Family Medicine

## 2014-08-21 ENCOUNTER — Encounter: Payer: Self-pay | Admitting: Family Medicine

## 2014-08-21 ENCOUNTER — Telehealth: Payer: Self-pay | Admitting: Family Medicine

## 2014-08-21 VITALS — BP 110/70 | HR 80 | Temp 98.3°F | Wt 157.5 lb

## 2014-08-21 DIAGNOSIS — R1032 Left lower quadrant pain: Secondary | ICD-10-CM | POA: Diagnosis not present

## 2014-08-21 DIAGNOSIS — R197 Diarrhea, unspecified: Secondary | ICD-10-CM | POA: Diagnosis not present

## 2014-08-21 DIAGNOSIS — R1031 Right lower quadrant pain: Secondary | ICD-10-CM

## 2014-08-21 DIAGNOSIS — R300 Dysuria: Secondary | ICD-10-CM | POA: Diagnosis not present

## 2014-08-21 LAB — POCT URINALYSIS DIPSTICK
Bilirubin, UA: NEGATIVE
Glucose, UA: NEGATIVE
KETONES UA: NEGATIVE
Leukocytes, UA: NEGATIVE
NITRITE UA: NEGATIVE
Protein, UA: NEGATIVE
RBC UA: NEGATIVE
Spec Grav, UA: 1.02
Urobilinogen, UA: 0.2
pH, UA: 6

## 2014-08-21 MED ORDER — NAPROXEN 500 MG PO TABS: ORAL_TABLET | ORAL | Status: DC

## 2014-08-21 MED ORDER — PHENAZOPYRIDINE HCL 100 MG PO TABS: 100.0000 mg | ORAL_TABLET | Freq: Three times a day (TID) | ORAL | Status: DC | PRN

## 2014-08-21 MED ORDER — PHENAZOPYRIDINE HCL 200 MG PO TABS: 200.0000 mg | ORAL_TABLET | Freq: Three times a day (TID) | ORAL | Status: DC | PRN

## 2014-08-21 NOTE — Progress Notes (Signed)
Pre visit review using our clinic review tool, if applicable. No additional management support is needed unless otherwise documented below in the visit note. 

## 2014-08-21 NOTE — Patient Instructions (Addendum)
Urine looking clear today but we will send culture to be sure. Possible interstitial cystitis - treat with naprosyn twice daily for 3 days with food then as needed as well as pyridium as needed (anti-bladder medicine). Continue pushing water. We will refer you to urologist for further evaluation of your symptoms. Good to see you today, call us with questions. Let us know if any fever >101 or worsening symptoms.  Interstitial Cystitis Interstitial cystitis (IC) is a condition that results in discomfort or pain in the bladder and the surrounding pelvic region. The symptoms can be different from case to case and even in the same individual. People may experience:  Mild discomfort.  Pressure.  Tenderness.  Intense pain in the bladder and pelvic area. CAUSES  Because IC varies so much in symptoms and severity, people studying this disease believe it is not one but several diseases. Some caregivers use the term painful bladder syndrome (PBS) to describe cases with painful urinary symptoms. This may not meet the strictest definition of IC. The term IC / PBS includes all cases of urinary pain that cannot be connected to other causes, such as infection or urinary stones.  SYMPTOMS  Symptoms may include:  An urgent need to urinate.  A frequent need to urinate.  A combination of these symptoms. Pain may change in intensity as the bladder fills with urine or as it empties. Women's symptoms often get worse during menstruation. They may sometimes experience pain with vaginal intercourse. Some of the symptoms of IC / PBS seem like those of bacterial infection. Tests do not show infection. IC / PBS is far more common in women than in men.  DIAGNOSIS  The diagnosis of IC / PBS is based on:  Presence of pain related to the bladder, usually along with problems of frequency and urgency.  Not finding other diseases that could cause the symptoms.  Diagnostic tests that help rule out other diseases  include:  Urinalysis.  Urine culture.  Cystoscopy.  Biopsy of the bladder wall.  Distension of the bladder under anesthesia.  Urine cytology.  Laboratory examination of prostate secretions. A biopsy is a tissue sample that can be looked at under a microscope. Samples of the bladder and urethra may be removed during a cystoscopy. A biopsy helps rule out bladder cancer. TREATMENT  Scientists have not yet found a cure for IC / PBS. Patients with IC / PBS do not get better with antibiotic therapy. Caregivers cannot predict who will respond best to which treatment. Symptoms may disappear without explanation. Disappearing symptoms may coincide with an event such as a change in diet or treatment. Even when symptoms disappear, they may return after days, weeks, months, or years.  Because the causes of IC / PBS are unknown, current treatments are aimed at relieving symptoms. Many people are helped by one or a combination of the treatments. As researchers learn more about IC / PBS, the list of potential treatments will change. Patients should discuss their options with a caregiver. SURGERY  Surgery should be considered only if all available treatments have failed and the pain is disabling. Many approaches and techniques are used. Each approach has its own advantages and complications. Advantages and complications should be discussed with a urologist. Your caregiver may recommend consulting another urologist for a second opinion. Most caregivers are reluctant to operate because the outcome is unpredictable. Some people still have symptoms after surgery.  People considering surgery should discuss the potential risks and benefits, side effects, and  long- and short-term complications with their family, as well as with people who have already had the procedure. Surgery requires anesthesia, hospitalization, and in some cases weeks or months of recovery. As the complexity of the procedure increases, so do the  chances for complications and for failure. HOME CARE INSTRUCTIONS   All drugs, even those sold over the counter, have side effects. Patients should always consult a caregiver before using any drug for an extended amount of time. Only take over-the-counter or prescription medicines for pain, discomfort, or fever as directed by your caregiver.  Many patients feel that smoking makes their symptoms worse. How the by-products of tobacco that are excreted in the urine affect IC / PBS is unknown. Smoking is the major known cause of bladder cancer. One of the best things smokers can do for their bladder and their overall health is to quit.  Many patients feel that gentle stretching exercises help relieve IC / PBS symptoms.  Methods vary, but basically patients decide to empty their bladder at designated times and use relaxation techniques and distractions to keep to the schedule. Gradually, patients try to lengthen the time between scheduled voids. A diary in which to record voiding times is usually helpful in keeping track of progress. MAKE SURE YOU:   Understand these instructions.  Will watch your condition.  Will get help right away if you are not doing well or get worse. Document Released: 10/23/2003 Document Revised: 05/16/2011 Document Reviewed: 01/07/2008 [facility] Patient Information 2015 [place], [place]. This information is not intended to replace advice given to you by your health care provider. Make sure you discuss any questions you have with your health care provider.

## 2014-08-21 NOTE — Assessment & Plan Note (Addendum)
Urinary symptoms include suprapubic discomfort/pressure, dysuria, urgency, frequency, and recently incomplete emptying, also with longstanding mild stress incontinence in the past.  Urinalysis without evidence of infection today. Regardless send UCx to ensure no recurrent infection given sxs.  ?interstitial cystitis - discussed this. rec trial naprosyn 500mg  bid x 3 days with food, Rx strength pyridium 200mg  in place of OTC azo, and will refer to urology for further evaluation of this as well as endorsed longstanding stress/urge incontinence sxs, consider urodynamics.  Pt agrees with plan.

## 2014-08-21 NOTE — Telephone Encounter (Signed)
Pt has appt 08/21/14 at 12:30 with Dr Darnell Level.

## 2014-08-21 NOTE — Progress Notes (Signed)
BP 110/70 mmHg  Pulse 80  Temp(Src) 98.3 F (36.8 C) (Oral)  Wt 157 lb 8 oz (71.442 kg)   CC: dysuria  Subjective:    Patient ID: Kristin Kramer, female    DOB: December 10, 1940, 74 y.o.   MRN: [id number]  HPI: Kristin Kramer is a 74 y.o. female presenting on 08/21/2014 for Dysuria   3 wk h/o dysuria, urgency, suprapubic pain. [facility] and treated for UTI with 3d bactrim course. UCx returned negative. Was told there was [facility] in [facility]. Bactrim course did not help at all. She since has been using OTC azo and increased fluid intake. This past Saturday while in [place] had associated inability to void. Every time she goes shortly after feels increased urgency again. Denies significant daytime urinary incontinence. Some night time leaking longstanding, as well as stress incontinence with coughing and sneezing.   No fevers or chills, nausea/ vomiting, back pain. + persistent suprapubic discomfort but no gross hematuria.   No h/o recurrent UTIs, no h/o kidney stones. No caffeine.   Undergoing evaluation by GI for chronic diarrhea with crampy abd pain and empirically started on flagyl 358m QID for 2 wks (started 2d ago). Also on probiotic and scheduled for colonoscopy later in the month. Magnesium was stopped.   Relevant past medical, surgical, family and social history reviewed and updated as indicated. Interim medical history since our last visit reviewed. Allergies and medications reviewed and updated. Current Outpatient Prescriptions on File Prior to Visit  Medication Sig  . acetaminophen (TYLENOL) 325 MG tablet Take 650 mg by mouth every 6 (six) hours as needed. For pain  . calcium carbonate (OS-CAL) 600 MG TABS Take 600 mg by mouth 2 (two) times daily with a meal.  . cetirizine (ZYRTEC) 10 MG tablet Take 10 mg by mouth daily.  . Cholecalciferol (VITAMIN D-3) 5000 UNITS TABS Take 5,000 Units by mouth daily.  . Coenzyme Q10 (COQ-10 PO) Take by mouth.    . DULoxetine (CYMBALTA) 20 MG capsule Take one daily  . magnesium gluconate (MAGONATE) 500 MG tablet Take 500 mg by mouth daily.  . metroNIDAZOLE (FLAGYL) 250 MG tablet Take 1 tablet (250 mg total) by mouth 4 (four) times daily.  . Probiotic Product (PROBIOTIC PO) Take by mouth daily.  . TURMERIC CURCUMIN PO Take by mouth.  . Na Sulfate-K Sulfate-Mg Sulf SOLN Take 1 kit by mouth once. (Patient not taking: Reported on 08/21/2014)   No current facility-administered medications on file prior to visit.   Past Medical History  Diagnosis Date  . Seasonal allergies     cats, dust, mold, roaches  . Depression     prior on lexapro, celexa, wellbutrin.  high cymbalta doses cause tremors  . Migraines   . 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 Arthritis   . Knee osteoarthritis 2013, 2014    bilateral s/p B TKR ([facility]  . Post-operative nausea and vomiting   . History of pneumonia   . History of UTI     12/2011 - Treatment began preoperatively with CIPRO 50102mBID  . Refusal of blood transfusions as patient is Jehovah's Witness   . Osteopenia   . HLD (hyperlipidemia)   . Vitamin D deficiency   . Abdominal pain, other specified site [facility] vs sclerosing mesenteritis vs nonspecific lymphadenitis  . H/O seasonal allergies   . Squamous cell skin  cancer 2015 multiple, 2016    R anterior leg (Dr. Isac Sarna)  . Abnormal ECG     a. left axis deviation;  b. 12/2011 Echo: EF 55-60%, no rwma, pasp 59mHg.    Past Surgical History  Procedure Laterality Date  . Tonsillectomy and adenoidectomy  1945  . Partial hysterectomy  1980    fibroids, ovaries remained  . Bunionectomy  2013    R and L foot  . Cataract extraction  2013    R, pending L  . Cosmetic surgery  2002    face lift  . Eye surgery      bilateral cataract surgery  . Total knee arthroplasty  01/02/2012    Right - Surgeon: RLorn Junes MD  . Breast enhancement surgery     . Mohs surgery  2011    R leg  . Colonoscopy  2007    no records received  . Dexa  06/2008    T -1.4 spine and hip  . Total knee arthroplasty Left 03/18/2013    Surgeon: RLorn Junes MD   Review of Systems Per HPI unless specifically indicated above     Objective:    BP 110/70 mmHg  Pulse 80  Temp(Src) 98.3 F (36.8 C) (Oral)  Wt 157 lb 8 oz (71.442 kg)  Wt Readings from Last 3 Encounters:  08/21/14 157 lb 8 oz (71.442 kg)  08/19/14 159 lb 12.8 oz (72.485 kg)  07/31/14 157 lb (71.215 kg)    Physical Exam  Constitutional: She appears well-developed and well-nourished. No distress.  Well appearing  Abdominal: Soft. Normal appearance and bowel sounds are normal. She exhibits no distension and no mass. There is no hepatosplenomegaly. There is no tenderness. There is no rigidity, no rebound, no guarding, no CVA tenderness and negative Murphy's sign.  Musculoskeletal: She exhibits no edema.  Skin: Skin is warm and dry. No rash noted.  Psychiatric: She has a normal mood and affect.  Nursing note and vitals reviewed.  Results for orders placed or performed in visit on 08/21/14  POCT Urinalysis Dipstick  Result Value Ref Range   Color, UA Yellow    Clarity, UA Clear    Glucose, UA Negative    Bilirubin, UA Negative    Ketones, UA Negative    Spec Grav, UA 1.020    Blood, UA Negative    pH, UA 6.0    Protein, UA Negative    Urobilinogen, UA 0.2    Nitrite, UA Negative    Leukocytes, UA Negative Negative      Assessment & Plan:   Problem List Items Addressed This Visit    Dysuria - Primary    Urinary symptoms include suprapubic discomfort/pressure, dysuria, urgency, frequency, and recently incomplete emptying, also with longstanding mild stress incontinence in the past.  Urinalysis without evidence of infection today. Regardless send UCx to ensure no recurrent infection given sxs.  ?interstitial cystitis - discussed this. rec trial naprosyn 5022mbid x 3 days with  food, Rx strength pyridium 20032mn place of OTC azo, and will refer to urology for further evaluation of this as well as endorsed longstanding stress/urge incontinence sxs, consider urodynamics.  Pt agrees with plan.      Relevant Orders   POCT Urinalysis Dipstick (Completed)   Urine culture   Ambulatory referral to Urology       Follow up plan: Return if symptoms worsen or fail to improve.

## 2014-08-21 NOTE — Telephone Encounter (Signed)
Patient Name: Kristin Kramer S [facility] DOB: 12-18-1940 Initial Comment Caller states, for 3 weeks now, has a lot of pressure, but she has difficulty urinating, has tried OTC meds Nurse Assessment Nurse: Vallery Sa, RN, [place] Date/Time (Eastern Time): 08/21/2014 9:25:11 AM Confirm and document reason for call. If symptomatic, describe symptoms. ---Caller states she has had pain with urination for the past 3 week. No fever. Has the patient traveled out of the country within the last 30 days? ---No Does the patient require triage? ---Yes Related visit to physician within the last 2 weeks? ---No Does the PT have any chronic conditions? (i.e. diabetes, asthma, etc.) ---Yes List chronic conditions. 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 [name].

## 2014-08-22 LAB — GASTROINTESTINAL PATHOGEN PANEL PCR
C. difficile Tox A/B, PCR: NEGATIVE
CAMPYLOBACTER, PCR: NEGATIVE
Cryptosporidium, PCR: NEGATIVE
E COLI (ETEC) LT/ST, PCR: NEGATIVE
E COLI (STEC) STX1/STX2, PCR: NEGATIVE
E coli 0157, PCR: NEGATIVE
Giardia lamblia, PCR: NEGATIVE
NOROVIRUS, PCR: NEGATIVE
Rotavirus A, PCR: NEGATIVE
SALMONELLA, PCR: NEGATIVE
Shigella, PCR: NEGATIVE

## 2014-08-22 LAB — FECAL LACTOFERRIN, QUANT: LACTOFERRIN: NEGATIVE

## 2014-08-22 LAB — URINE CULTURE
COLONY COUNT: NO GROWTH
ORGANISM ID, BACTERIA: NO GROWTH

## 2014-08-28 ENCOUNTER — Ambulatory Visit (AMBULATORY_SURGERY_CENTER): Payer: Medicare Other | Admitting: Gastroenterology

## 2014-08-28 ENCOUNTER — Encounter: Payer: Self-pay | Admitting: Gastroenterology

## 2014-08-28 VITALS — BP 113/69 | HR 73 | Temp 98.2°F | Resp 10 | Ht 65.0 in | Wt 159.0 lb

## 2014-08-28 DIAGNOSIS — D123 Benign neoplasm of transverse colon: Secondary | ICD-10-CM

## 2014-08-28 DIAGNOSIS — R197 Diarrhea, unspecified: Secondary | ICD-10-CM

## 2014-08-28 DIAGNOSIS — R109 Unspecified abdominal pain: Secondary | ICD-10-CM | POA: Diagnosis not present

## 2014-08-28 DIAGNOSIS — F419 Anxiety disorder, unspecified: Secondary | ICD-10-CM | POA: Diagnosis not present

## 2014-08-28 MED ORDER — SODIUM CHLORIDE 0.9 % IV SOLN: 500.0000 mL | INTRAVENOUS | Status: DC

## 2014-08-28 NOTE — Progress Notes (Signed)
Called to room to assist during endoscopic procedure.  Patient ID and intended procedure confirmed with present staff. Received instructions for my participation in the procedure from the performing physician.  

## 2014-08-28 NOTE — Progress Notes (Signed)
A/ox3 pleased with MAC, report to Celia RN 

## 2014-08-28 NOTE — Op Note (Signed)
[place]  [address] [place], [address]   COLONOSCOPY PROCEDURE REPORT  PATIENT: Kristin Kramer, Kristin Kramer  MR#: [id number] BIRTHDATE: [birth year] , 73  yrs. old GENDER: female ENDOSCOPIST: Ladene Artist, MD, [facility] REFERRED OE:[id number] [name], M.D. PROCEDURE DATE:  08/28/2014 PROCEDURE:   Colonoscopy, diagnostic, Colonoscopy with biopsy, and Colonoscopy with snare polypectomy First Screening Colonoscopy - Avg.  risk and is 50 yrs.  old or older - No.  Prior Negative Screening - Now for repeat screening. N/A  History of Adenoma - Now for follow-up colonoscopy & has been > or = to 3 yrs.  N/A  Polyps removed today? Yes ASA CLASS:   Class II INDICATIONS:Clinically significant diarrhea of unexplained origin and Colorectal Neoplasm Risk Assessment for this procedure is average risk. MEDICATIONS: Monitored anesthesia care and Propofol 250 mg IV DESCRIPTION OF PROCEDURE:   After the risks benefits and alternatives of the procedure were thoroughly explained, informed consent was obtained.  The digital rectal exam revealed no abnormalities of the rectum.   The LB [id number]  endoscope was introduced through the anus and advanced to the cecum, which was identified by both the appendix and ileocecal valve. No adverse events experienced.   The quality of the prep was good.  (Suprep was used)  The instrument was then slowly withdrawn as the colon was fully examined. Estimated blood loss is zero unless otherwise noted in this procedure report.    COLON FINDINGS: A sessile polyp measuring 8 mm in size was found in the transverse colon.  A polypectomy was performed using snare cautery.  The resection was complete, the polyp tissue was completely retrieved and sent to histology.   The examination was otherwise normal. Random biopsies taken throughout the colon. Retroflexed views revealed no abnormalities. The time to cecum = 4.5 Withdrawal time = 13.7   The scope was  withdrawn and the procedure completed. COMPLICATIONS: There were no immediate complications.  ENDOSCOPIC IMPRESSION: 1.   Sessile polyp in the transverse colon; polypectomy performed using snare cautery 2.   The examination was otherwise normal  RECOMMENDATIONS: 1.  Await pathology results 2.  Repeat colonoscopy in 5 years if polyp adenomatous; otherwise no plans for routine screening colonoscopy in 10 years as these type of exam usually stop around age 70. 96.  Call office for follow-up appointment in 3-4 weeks  eSigned:  Ladene Artist, MD, [facility] 08/28/2014 8:33 AM

## 2014-08-28 NOTE — Patient Instructions (Signed)
Discharge instructions given. Handout on polyps. Resume previous medications. YOU HAD AN ENDOSCOPIC PROCEDURE TODAY AT THE [facility]:   Refer to the procedure report that was given to you for any specific questions about what was found during the examination.  If the procedure report does not answer your questions, please call your gastroenterologist to clarify.  If you requested that your care partner not be given the details of your procedure findings, then the procedure report has been included in a sealed envelope for you to review at your convenience later.  YOU SHOULD EXPECT: Some feelings of bloating in the abdomen. Passage of more gas than usual.  Walking can help get rid of the air that was put into your GI tract during the procedure and reduce the bloating. If you had a lower endoscopy (such as a colonoscopy or flexible sigmoidoscopy) you may notice spotting of blood in your stool or on the toilet paper. If you underwent a bowel prep for your procedure, you may not have a normal bowel movement for a few days.  Please Note:  You might notice some irritation and congestion in your nose or some drainage.  This is from the oxygen used during your procedure.  There is no need for concern and it should clear up in a day or so.  SYMPTOMS TO REPORT IMMEDIATELY:   Following lower endoscopy (colonoscopy or flexible sigmoidoscopy):  Excessive amounts of blood in the stool  Significant tenderness or worsening of abdominal pains  Swelling of the abdomen that is new, acute  Fever of 100F or higher   For urgent or emergent issues, a gastroenterologist can be reached at any hour by calling [phone].   DIET: Your first meal following the procedure should be a small meal and then it is ok to progress to your normal diet. Heavy or fried foods are harder to digest and may make you feel nauseous or bloated.  Likewise, meals heavy in dairy and vegetables can increase bloating.  Drink  plenty of fluids but you should avoid alcoholic beverages for 24 hours.  ACTIVITY:  You should plan to take it easy for the rest of today and you should NOT DRIVE or use heavy machinery until tomorrow (because of the sedation medicines used during the test).    FOLLOW UP: Our staff will call the number listed on your records the next business day following your procedure to check on you and address any questions or concerns that you may have regarding the information given to you following your procedure. If we do not reach you, we will leave a message.  However, if you are feeling well and you are not experiencing any problems, there is no need to return our call.  We will assume that you have returned to your regular daily activities without incident.  If any biopsies were taken you will be contacted by phone or by letter within the next 1-3 weeks.  Please call us at [phone] if you have not heard about the biopsies in 3 weeks.    SIGNATURES/CONFIDENTIALITY: You and/or your care partner have signed paperwork which will be entered into your electronic medical record.  These signatures attest to the fact that that the information above on your After Visit Summary has been reviewed and is understood.  Full responsibility of the confidentiality of this discharge information lies with you and/or your care-partner. 

## 2014-08-29 ENCOUNTER — Telehealth: Payer: Self-pay | Admitting: *Deleted

## 2014-08-29 NOTE — Telephone Encounter (Signed)
  Follow up Call-  Call back number 08/28/2014  Post procedure Call Back phone  # [phone]  Permission to leave phone message Yes     Patient questions:  Do you have a fever, pain , or abdominal swelling? No. Pain Score  0 *  Have you tolerated food without any problems? Yes.    Have you been able to return to your normal activities? Yes.    Do you have any questions about your discharge instructions: Diet   No. Medications  No. Follow up visit  No.  Do you have questions or concerns about your Care? No.  Actions: * If pain score is 4 or above: No action needed, pain <4.

## 2014-09-02 ENCOUNTER — Encounter: Payer: Self-pay | Admitting: Gastroenterology

## 2014-09-04 ENCOUNTER — Encounter: Payer: Self-pay | Admitting: Family Medicine

## 2014-09-12 DIAGNOSIS — N3946 Mixed incontinence: Secondary | ICD-10-CM | POA: Diagnosis not present

## 2014-10-20 ENCOUNTER — Telehealth: Payer: Self-pay

## 2014-10-20 DIAGNOSIS — R197 Diarrhea, unspecified: Secondary | ICD-10-CM

## 2014-10-20 DIAGNOSIS — R109 Unspecified abdominal pain: Secondary | ICD-10-CM

## 2014-10-20 DIAGNOSIS — R740 Nonspecific elevation of levels of transaminase and lactic acid dehydrogenase [LDH]: Principal | ICD-10-CM

## 2014-10-20 DIAGNOSIS — R7401 Elevation of levels of liver transaminase levels: Secondary | ICD-10-CM

## 2014-10-20 DIAGNOSIS — M8580 Other specified disorders of bone density and structure, unspecified site: Secondary | ICD-10-CM

## 2014-10-20 NOTE — Telephone Encounter (Signed)
Pt left v/m; pt was seen 07/31/14 for annual; pt does want to schedule tetanus shot and wants lab test done at same time for thyroid testing.Please advise.

## 2014-10-21 NOTE — Telephone Encounter (Signed)
May come in for Tdap. TSH ordered.  Tdap may not be covered by insurance.

## 2014-10-21 NOTE — Telephone Encounter (Signed)
Patient advised.   Appts scheduled.

## 2014-10-22 ENCOUNTER — Ambulatory Visit (INDEPENDENT_AMBULATORY_CARE_PROVIDER_SITE_OTHER): Payer: Medicare Other | Admitting: *Deleted

## 2014-10-22 ENCOUNTER — Other Ambulatory Visit (INDEPENDENT_AMBULATORY_CARE_PROVIDER_SITE_OTHER): Payer: Medicare Other

## 2014-10-22 DIAGNOSIS — R109 Unspecified abdominal pain: Secondary | ICD-10-CM | POA: Diagnosis not present

## 2014-10-22 DIAGNOSIS — R740 Nonspecific elevation of levels of transaminase and lactic acid dehydrogenase [LDH]: Secondary | ICD-10-CM | POA: Diagnosis not present

## 2014-10-22 DIAGNOSIS — R197 Diarrhea, unspecified: Secondary | ICD-10-CM | POA: Diagnosis not present

## 2014-10-22 DIAGNOSIS — M8580 Other specified disorders of bone density and structure, unspecified site: Secondary | ICD-10-CM | POA: Diagnosis not present

## 2014-10-22 DIAGNOSIS — Z23 Encounter for immunization: Secondary | ICD-10-CM | POA: Diagnosis not present

## 2014-10-22 DIAGNOSIS — R7401 Elevation of levels of liver transaminase levels: Secondary | ICD-10-CM

## 2014-10-22 LAB — TSH: TSH: 4.08 u[IU]/mL (ref 0.35–4.50)

## 2014-11-27 DIAGNOSIS — Z85828 Personal history of other malignant neoplasm of skin: Secondary | ICD-10-CM | POA: Diagnosis not present

## 2014-11-27 DIAGNOSIS — L570 Actinic keratosis: Secondary | ICD-10-CM | POA: Diagnosis not present

## 2014-11-27 DIAGNOSIS — Z419 Encounter for procedure for purposes other than remedying health state, unspecified: Secondary | ICD-10-CM | POA: Diagnosis not present

## 2014-12-16 DIAGNOSIS — H1045 Other chronic allergic conjunctivitis: Secondary | ICD-10-CM | POA: Diagnosis not present

## 2014-12-16 DIAGNOSIS — J3089 Other allergic rhinitis: Secondary | ICD-10-CM | POA: Diagnosis not present

## 2014-12-29 ENCOUNTER — Ambulatory Visit (INDEPENDENT_AMBULATORY_CARE_PROVIDER_SITE_OTHER): Payer: Medicare Other | Admitting: Family Medicine

## 2014-12-29 ENCOUNTER — Encounter: Payer: Self-pay | Admitting: Family Medicine

## 2014-12-29 VITALS — BP 124/82 | HR 84 | Temp 97.5°F | Wt 160.0 lb

## 2014-12-29 DIAGNOSIS — Z23 Encounter for immunization: Secondary | ICD-10-CM | POA: Diagnosis not present

## 2014-12-29 DIAGNOSIS — F329 Major depressive disorder, single episode, unspecified: Secondary | ICD-10-CM

## 2014-12-29 DIAGNOSIS — T43205A Adverse effect of unspecified antidepressants, initial encounter: Secondary | ICD-10-CM

## 2014-12-29 DIAGNOSIS — F32A Depression, unspecified: Secondary | ICD-10-CM

## 2014-12-29 MED ORDER — SERTRALINE HCL 25 MG PO TABS: 25.0000 mg | ORAL_TABLET | Freq: Every day | ORAL | Status: DC

## 2014-12-29 NOTE — Assessment & Plan Note (Addendum)
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 Discussed with patient. Pt already on lowest dose 20mg  capsules. Will continue slowed taper off cymbalta - QOD for 2 wks then trial off. Will also start sertraline 25mg  daily for continued serotonin effect throughout taper. If persistent difficulty stopping cymbalta, pt will call me for further recommendations.

## 2014-12-29 NOTE — Patient Instructions (Addendum)
Flu shot today. Let's start sertraline 25mg  daily (zoloft). Decrease cymbalta to 1 capsule 20mg  every other day for 2 weeks. Then try stopping cymbalta. If trouble with this, return to every other day and call me with an update. Call me with an update in 2 weeks.
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
## 2014-12-29 NOTE — Progress Notes (Signed)
Pre visit review using our clinic review tool, if applicable. No additional management support is needed unless otherwise documented below in the visit note. 

## 2014-12-29 NOTE — Assessment & Plan Note (Signed)
Discussed. See above. rec start sertraline 25mg  daily and work on cymbalta taper.

## 2015-01-07 ENCOUNTER — Other Ambulatory Visit: Payer: Self-pay

## 2015-01-07 DIAGNOSIS — Z1231 Encounter for screening mammogram for malignant neoplasm of breast: Secondary | ICD-10-CM

## 2015-01-15 ENCOUNTER — Ambulatory Visit (INDEPENDENT_AMBULATORY_CARE_PROVIDER_SITE_OTHER): Payer: Medicare Other

## 2015-01-15 ENCOUNTER — Ambulatory Visit (INDEPENDENT_AMBULATORY_CARE_PROVIDER_SITE_OTHER): Payer: Medicare Other | Admitting: Podiatry

## 2015-01-15 ENCOUNTER — Encounter: Payer: Self-pay | Admitting: Podiatry

## 2015-01-15 VITALS — BP 132/78 | HR 80 | Resp 18

## 2015-01-15 DIAGNOSIS — M779 Enthesopathy, unspecified: Secondary | ICD-10-CM

## 2015-01-15 DIAGNOSIS — M7742 Metatarsalgia, left foot: Secondary | ICD-10-CM | POA: Diagnosis not present

## 2015-01-15 DIAGNOSIS — R52 Pain, unspecified: Secondary | ICD-10-CM | POA: Diagnosis not present

## 2015-01-15 MED ORDER — DICLOFENAC SODIUM 75 MG PO TBEC: 75.0000 mg | DELAYED_RELEASE_TABLET | Freq: Two times a day (BID) | ORAL | Status: DC

## 2015-01-15 NOTE — Progress Notes (Signed)
Subjective:    Patient ID: Kristin Kramer, female    DOB: 20-Nov-1940, 74 y.o.   MRN: [id number]   HPI  74 year old female presents the office concerns of left foot pain to the ball of her foot which has been ongoing for the last couple of months and appears to be worsening. The pain is intermittent in nature and mostly with weightbearing or pressure after periods of time. She states that her left second toe started to deviate towards her big toe as well. She denies any numbness or tingling. She denies any history of injury or trauma. No swelling or redness. She's tried some cream overlying the area without any relief. She states that she presented had a similar shown the right foot for which she was seen by Dr. Paulla Dolly and he would numb her foot and put a steroid injection in the ball of the foot. She had 3 injections on the right side. No other complaints at this time.   Review of Systems  All other systems reviewed and are negative.      Objective:   Physical Exam General: AAO x3, NAD  Dermatological: Skin is warm, dry and supple bilateral. Nails x 10 are well manicured; remaining integument appears unremarkable at this time. There are no open sores, no preulcerative lesions, no rash or signs of infection present.  Vascular: Dorsalis Pedis artery and Posterior Tibial artery pedal pulses are 2/4 bilateral with immedate capillary fill time. Pedal hair growth present. No varicosities and no lower extremity edema present bilateral. There is no pain with calf compression, swelling, warmth, erythema.   Neruologic: Grossly intact via light touch bilateral. Vibratory intact via tuning fork bilateral. Protective threshold with Semmes Wienstein monofilament intact to all pedal sites bilateral. Patellar and Achilles deep tendon reflexes 2+ bilateral. No Babinski or clonus noted bilateral.   Musculoskeletal: There does appear to be prominent metatarsal heads plantarly with atrophy of the fat pad. The  majority of her pain seems to be localized underlying submetatarsal 2 into the submetatarsal 2 area. There does appear to be some medial deviation of the second toe. There is no significant pain on the dorsal aspect of the MTPJ. There is no area specific pinpoint bony tenderness or pain the vibratory sensation. There is no pain or crepitation with MPJ range of motion. There does appear to be some thickened skin on the plantar aspect of the submetatarsal area of the left foot from pressure. No pain, crepitus, or limitation noted with foot and ankle range of motion bilateral. Muscular strength 5/5 in all groups tested bilateral.  Gait: Unassisted, Nonantalgic.      Assessment & Plan:  74 year old female with left foot second MPJ capsulitis, metatarsalgia -Treatment options discussed including all alternatives, risks, and complications -X-rays were obtained and reviewed with the patient.  -Etiology of symptoms were discussed -At this appointment she would like to have a steroid injection into the area. I discussed with her risks, complications for which she understands and verbally consents. Under sterile conditions a total 1.5 mL of a mixture of 2% lidocaine plain and 0.5% Marcaine plain was infiltrated around the second MPJ. Once anesthetized the skin was prepped in sterile fashion. I did try to aspirate fluid out of the second MTPJ see if there is inflammation causing her pain however no significant fluid was aspirated. A total of 1 mL mixture of dexamethasone phosphate and 0.5% Marcaine plain was infiltrated into the area of maximal tenderness submetatarsal 2 without complications. Post injection  care was discussed the patient. -Offloading pads made and dispensed -Disussed orthotics.  -Shoegear modifications -Ice to the area.  -Prescribed voltaren. Discussed side effects of the medication and directed to stop if any are to occur and call the office.  -Follow-up in 4 weeks if symptoms not resolved or  sooner if any problems arise. In the meantime, encouraged to call the office with any questions, concerns, change in symptoms.   Celesta Gentile, DPM

## 2015-02-06 DIAGNOSIS — Z85828 Personal history of other malignant neoplasm of skin: Secondary | ICD-10-CM | POA: Diagnosis not present

## 2015-02-06 DIAGNOSIS — D485 Neoplasm of uncertain behavior of skin: Secondary | ICD-10-CM | POA: Diagnosis not present

## 2015-02-06 DIAGNOSIS — C44629 Squamous cell carcinoma of skin of left upper limb, including shoulder: Secondary | ICD-10-CM | POA: Diagnosis not present

## 2015-02-11 ENCOUNTER — Ambulatory Visit: Payer: Self-pay

## 2015-02-11 ENCOUNTER — Ambulatory Visit
Admission: RE | Admit: 2015-02-11 | Discharge: 2015-02-11 | Disposition: A | Discharge status: Home / Self Care | Payer: Medicare Other | Source: Ambulatory Visit

## 2015-02-11 DIAGNOSIS — Z1231 Encounter for screening mammogram for malignant neoplasm of breast: Secondary | ICD-10-CM | POA: Diagnosis not present

## 2015-02-11 LAB — HM MAMMOGRAPHY: HM MAMMO: NORMAL

## 2015-02-12 ENCOUNTER — Encounter: Payer: Self-pay | Admitting: Podiatry

## 2015-02-12 ENCOUNTER — Encounter: Payer: Self-pay | Admitting: *Deleted

## 2015-02-12 ENCOUNTER — Ambulatory Visit (INDEPENDENT_AMBULATORY_CARE_PROVIDER_SITE_OTHER): Payer: Medicare Other | Admitting: Podiatry

## 2015-02-12 VITALS — BP 143/81 | HR 73 | Resp 18

## 2015-02-12 DIAGNOSIS — M779 Enthesopathy, unspecified: Secondary | ICD-10-CM | POA: Diagnosis not present

## 2015-02-12 DIAGNOSIS — D3610 Benign neoplasm of peripheral nerves and autonomic nervous system, unspecified: Secondary | ICD-10-CM | POA: Diagnosis not present

## 2015-02-14 NOTE — Progress Notes (Signed)
Patient ID: Kristin Kramer, female   DOB: [birth year], 74 y.o.   MRN: [id number]  Subjective: 74 year old female presents the office if her follow-up by which the left foot pain. She states that she is not that much better and that she was at last appointment she could not tell a difference the injection last appointment. Last appointment she did have an injection around the second MTPJ. She states that she does have numbness to the second toe at times as well. She denies any recent injury or trauma. No swelling or redness. She's been taking anti-inflammatory. No other complaints.  Objective: AAO 3, NAD DP/PT pulses palpable 2/4, CRT less than 3 seconds Protective sensation intact with Simms Weinstein monofilament There is tenderness palpation on second interspace of the left foot. There is no palpable neuroma identified have there is numbness in the second toe upon compression of the second interspace. There is mild discomfort on the second MTPJ as well and there is mild deviation of the digit at the level of the PIPJ. There is no specific area pinpoint bony tenderness or pain the vibratory sensation. No open lesions or pre-ulcerative lesions. No pain with calf compression, swelling, warmth, erythema.  Assessment: 74 year old female left foot pain, possible neuroma  Plan: -Treatment options discussed including all alternatives, risks, and complications -At this appointment at discussed an injection around possible neuroma the second interspace of the left foot. Discussed risks, complications for which she understands and verbally consents. Under sterile conditions a total of 2 mL of a mixture of Kenalog 10, 0.5% Marcaine plain and 2% lidocaine plain was infiltrated into the second interspace the left foot without, complications. Post injection care was discussed. -Modified orthotics to take more pressure off the second MTPJ and second interspace. -Ice to the area -Anti-inflammatories. -Discussed  that if symptoms persist may get an ultrasound. -Follow-up in 4-6 weeks or sooner if any problems arise. In the meantime, encouraged to call the office with any questions, concerns, change in symptoms.   Celesta Gentile, DPM

## 2015-03-26 ENCOUNTER — Ambulatory Visit: Payer: Medicare Other | Admitting: Podiatry

## 2015-03-31 ENCOUNTER — Ambulatory Visit: Payer: Medicare Other | Admitting: Podiatry

## 2015-04-03 DIAGNOSIS — H1859 Other hereditary corneal dystrophies: Secondary | ICD-10-CM | POA: Diagnosis not present

## 2015-04-03 DIAGNOSIS — Z9841 Cataract extraction status, right eye: Secondary | ICD-10-CM | POA: Diagnosis not present

## 2015-04-03 DIAGNOSIS — Z9842 Cataract extraction status, left eye: Secondary | ICD-10-CM | POA: Diagnosis not present

## 2015-04-03 DIAGNOSIS — H52223 Regular astigmatism, bilateral: Secondary | ICD-10-CM | POA: Diagnosis not present

## 2015-04-28 ENCOUNTER — Other Ambulatory Visit: Payer: Self-pay | Admitting: Family Medicine

## 2015-05-04 DIAGNOSIS — H1859 Other hereditary corneal dystrophies: Secondary | ICD-10-CM | POA: Diagnosis not present

## 2015-05-05 ENCOUNTER — Other Ambulatory Visit: Payer: Self-pay | Admitting: *Deleted

## 2015-05-21 ENCOUNTER — Other Ambulatory Visit: Payer: Self-pay | Admitting: *Deleted

## 2015-06-11 DIAGNOSIS — Z85828 Personal history of other malignant neoplasm of skin: Secondary | ICD-10-CM | POA: Diagnosis not present

## 2015-06-11 DIAGNOSIS — D485 Neoplasm of uncertain behavior of skin: Secondary | ICD-10-CM | POA: Diagnosis not present

## 2015-06-11 DIAGNOSIS — C44722 Squamous cell carcinoma of skin of right lower limb, including hip: Secondary | ICD-10-CM | POA: Diagnosis not present

## 2015-06-17 ENCOUNTER — Encounter: Payer: Self-pay | Admitting: Family Medicine

## 2015-07-24 ENCOUNTER — Telehealth: Payer: Self-pay | Admitting: Family Medicine

## 2015-07-24 NOTE — Telephone Encounter (Signed)
PLEASE NOTE: All timestamps contained within this report are represented as [place] Standard Time. CONFIDENTIALTY NOTICE: This fax transmission is intended only for the addressee. It contains information that is legally privileged, confidential or otherwise protected from use or disclosure. If you are not the intended recipient, you are strictly prohibited from reviewing, disclosing, copying using or disseminating any of this information or taking any action in reliance on or regarding this information. If you have received this fax in error, please notify us immediately by telephone so that we can arrange for its return to [place]. Phone: [phone], Toll-Free: [phone], Fax: [phone] Page: 1 of 1 Call Id: [id number] [place] Patient Name: [facility] DOB: 09/19/1940 Initial Comment caller states she thinks she may have shingles around her eyes Nurse Assessment Nurse: Dimas Chyle, RN, Dellis Filbert Date/Time Eilene Ghazi Time): 07/24/2015 12:51:06 PM Confirm and document reason for call. If symptomatic, describe symptoms. You must click the next button to save text entered. ---Caller states she thinks she may have shingles around her eyes. Symptoms started 3 days ago. Has the patient traveled out of the country within the last 30 days? ---No Does the patient have any new or worsening symptoms? ---Yes Will a triage be completed? ---Yes Related visit to physician within the last 2 weeks? ---No Does the PT have any chronic conditions? (i.e. diabetes, asthma, etc.) ---No Is this a behavioral health or substance abuse call? ---No Guidelines Guideline Title Affirmed Question Affirmed Notes Shingles [1] Shingles rash of face AND [2] eye pain or blurred vision Final Disposition User See Physician within 4 Hours (or PCP triage) Dimas Chyle, RN, Dellis Filbert Comments Attempted to schedule appointment for today and none  available. Referrals GO TO FACILITY UNDECIDED Disagree/Comply: Comply

## 2015-07-24 NOTE — Telephone Encounter (Signed)
plz call for update on Monday

## 2015-07-24 NOTE — Telephone Encounter (Signed)
Pt is going to [place] in [place] this afternoon.

## 2015-07-27 ENCOUNTER — Encounter: Payer: Self-pay | Admitting: Family Medicine

## 2015-07-27 ENCOUNTER — Ambulatory Visit (INDEPENDENT_AMBULATORY_CARE_PROVIDER_SITE_OTHER): Payer: Medicare Other | Admitting: Family Medicine

## 2015-07-27 VITALS — BP 104/70 | HR 79 | Temp 98.5°F | Ht 64.75 in | Wt 153.5 lb

## 2015-07-27 DIAGNOSIS — B029 Zoster without complications: Secondary | ICD-10-CM

## 2015-07-27 DIAGNOSIS — H5711 Ocular pain, right eye: Secondary | ICD-10-CM

## 2015-07-27 MED ORDER — VALACYCLOVIR HCL 1 G PO TABS: 1000.0000 mg | ORAL_TABLET | Freq: Three times a day (TID) | ORAL | Status: DC

## 2015-07-27 NOTE — Telephone Encounter (Signed)
Patient returned Kristin Kramer's call. Patient has appointment with Dr.Copland today at 3:45.

## 2015-07-27 NOTE — Telephone Encounter (Signed)
Left message on voice mail  to call back

## 2015-07-27 NOTE — Progress Notes (Signed)
Dr. Frederico Hamman T. Solace Manwarren, MD, [facility] [place], [address] Phone: [phone] Fax: [phone]  07/27/2015  Patient: Kristin Kramer, MRN: [id number], DOB: May 22, 1940, 75 y.o.  Primary Physician:  [name], MD   Chief Complaint  Patient presents with  . Rash    on face under right eye   Subjective:   Kristin Kramer is a 75 y.o. very pleasant female patient who presents with the following:  R EYE:  Patient is a very pleasant elderly woman with multiple medical problems detailed below who has had a vesicular purplish rash on the right side underneath her eye that has been present for 8 days. In the last 2 days she has had some pain in her eye and a very slight blurring of her vision.  Past Medical History, Surgical History, Social History, Family History, Problem List, Medications, and Allergies have been reviewed and updated if relevant.  Patient Active Problem List   Diagnosis Date Noted  . Antidepressant discontinuation syndrome 12/29/2014  . Dysuria 08/21/2014  . Advanced care planning/counseling discussion 07/31/2014  . Diarrhea 07/31/2014  . Chest wall pain 05/21/2014  . Herpes zoster 02/19/2014  . Constipation 04/06/2013  . Allergic rhinitis 04/06/2013  . Acute posthemorrhagic anemia 03/27/2013  . 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  . Complication of anesthesia   . Abnormal finding on EKG   . History of pneumonia   . Post-operative nausea and vomiting   . Refusal of blood transfusions as patient is Jehovah's Witness   . Medicare annual wellness visit, subsequent 01/14/2013  . Hot flashes 01/14/2013  . Transaminitis 01/03/2013  . Abdominal pain 01/03/2013  . Osteopenia   . HLD (hyperlipidemia)   . Vitamin D deficiency   . Anxiety 01/05/2012  . Seasonal allergies   . Squamous cell skin cancer   . Right knee DJD   . Depression   . Migraines   .  Abnormal EKG   . Arthritis     Past Medical History  Diagnosis Date  . Seasonal allergies     cats, dust, mold, roaches  . Depression     prior on lexapro, celexa, wellbutrin.  high cymbalta doses cause tremors  . Migraines   . 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 Arthritis   . Knee osteoarthritis 2013, 2014    bilateral s/p B [facility])  . Post-operative nausea and vomiting   . History of pneumonia   . History of UTI     12/2011 - Treatment began preoperatively with CIPRO 500mg  BID  . Refusal of blood transfusions as patient is Jehovah's Witness   . Osteopenia   . HLD (hyperlipidemia)   . Vitamin D deficiency   . Abdominal pain, other specified site 10.2014    [facility] ER - mesenteric adenitis vs sclerosing mesenteritis vs nonspecific lymphadenitis  . H/O seasonal allergies   . 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 Isac Sarna), R tibia  . Abnormal ECG     a. left axis deviation;  b. 12/2011 Echo: EF 55-60%, no rwma, pasp 67mmHg.    Past Surgical History  Procedure Laterality Date  . Tonsillectomy and adenoidectomy  1945  . Partial hysterectomy  1980    fibroids, ovaries remained  . Bunionectomy  2013    R and L foot  . Cataract  extraction  2013    R, pending L  . Cosmetic surgery  2002    face lift  . Eye surgery      bilateral cataract surgery  . 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 Breast enhancement surgery    . Mohs surgery  2011    R leg  . Colonoscopy  2007    no records received  . Dexa  06/2008    T -1.4 spine and hip  . Total knee arthroplasty Left 03/18/2013    Surgeon: Lorn Junes, MD  . Colonoscopy  08/2014    tubular adenoma, rpt 5 yrs [facility])    Social History   Social History  . Marital Status: Widowed    Spouse Name: N/A  . Number of Children: 3  . Years of Education: N/A   Occupational History  . Retired    Social History Main  Topics  . Smoking status: Never Smoker   . Smokeless tobacco: Never Used  . Alcohol Use: Yes     Comment: occasional  . Drug Use: No  . Sexual Activity: Not on file   Other Topics Concern  . Not on file   Social History Narrative   Lives alone.  Brother lives nearby.   Occupation: retired, was Network engineer and housewife   Edu: 1 yr college   Activity: limited by knee   Diet: good water, fruits/vegetables daily   Religion: Jehova's witness    Family History  Problem Relation Age of Onset  . Cancer Mother     lung, smoker  . Cancer Father     lung, smoker  . Cancer Maternal Grandmother 32    leukemia  . Coronary artery disease Paternal Grandmother 85    sudden cardiac death  . Diabetes Neg Hx   . Stroke Neg Hx     Allergies  Allergen Reactions  . Lipitor [Atorvastatin] Other (See Comments)    Cramps  . Other     NO BLOOD OR BLOOD PRODUCTS  . Pravastatin Other (See Comments)    Cramps     Medication list reviewed and updated in full in [facility].  ROS: GEN: Acute illness details above GI: Tolerating PO intake GU: maintaining adequate hydration and urination Pulm: No SOB Interactive and getting along well at home.  Otherwise, ROS is as per the HPI.  Objective:   BP 104/70 mmHg  Pulse 79  Temp(Src) 98.5 F (36.9 C) (Oral)  Ht 5' 4.75" (1.645 m)  Wt 153 lb 8 oz (69.627 kg)  BMI 25.73 kg/m2   GEN: WDWN, NAD, Non-toxic, A & O x 3 HEENT: Atraumatic, Normocephalic. Neck supple. No masses, No LAD. PERRLA. EOMI. Ears and Nose: No external deformity. CV: RRR, No M/G/R. No JVD. No thrill. No extra heart sounds. PULM: CTA B, no wheezes, crackles, rhonchi. No retractions. No resp. distress. No accessory muscle use. EXTR: No c/c/e NEURO Normal gait.  PSYCH: Normally interactive. Conversant. Not depressed or anxious appearing.  Calm demeanor.    SKIN: 2 vesicles beneath the patient's right eye.   Laboratory and Imaging Data:  Assessment and Plan:    Shingles - Plan: Ambulatory referral to Ophthalmology  Eye pain, right - Plan: Ambulatory referral to Ophthalmology  8 days out, but given potential ocular involvement we'll go ahead and start Valtrex and have the patient see ophthalmology. Appreciate their input.  Follow-up: No Follow-up on file.  New Prescriptions  VALACYCLOVIR (VALTREX) 1000 MG TABLET    Take 1 tablet (1,000 mg total) by mouth 3 (three) times daily.   Orders Placed This Encounter  Procedures  . Ambulatory referral to Ophthalmology    Signed,  Frederico Hamman T. Daniella Dewberry, MD   Patient's Medications  New Prescriptions   VALACYCLOVIR (VALTREX) 1000 MG TABLET    Take 1 tablet (1,000 mg total) by mouth 3 (three) times daily.  Previous Medications   ACETAMINOPHEN (TYLENOL) 325 MG TABLET    Take 650 mg by mouth every 6 (six) hours as needed. For pain   CALCIUM CARBONATE (OS-CAL) 600 MG TABS    Take 600 mg by mouth 2 (two) times daily with a meal.   CETIRIZINE (ZYRTEC) 10 MG TABLET    Take 10 mg by mouth daily.   CHOLECALCIFEROL (VITAMIN D-3) 5000 UNITS TABS    Take 5,000 Units by mouth daily.   COENZYME Q10 (COQ-10 PO)    Take by mouth.   MAGNESIUM GLUCONATE (MAGONATE) 500 MG TABLET    Take 500 mg by mouth daily.   OMEGA-3 FATTY ACIDS (FISH OIL) 1000 MG CAPS    Take 1 capsule by mouth daily.   PROBIOTIC PRODUCT (PROBIOTIC PO)    Take by mouth daily.   SERTRALINE (ZOLOFT) 25 MG TABLET    Take 1 tablet (25 mg total) by mouth at bedtime.   TURMERIC CURCUMIN PO    Take by mouth.  Modified Medications   No medications on file  Discontinued Medications   DICLOFENAC (VOLTAREN) 75 MG EC TABLET    Take 1 tablet (75 mg total) by mouth 2 (two) times daily.   DULOXETINE (CYMBALTA) 20 MG CAPSULE    Take one daily   NAPROXEN (NAPROSYN) 500 MG TABLET    Take one po bid x 3 days then prn pain, take with food   PHENAZOPYRIDINE (PYRIDIUM) 200 MG TABLET    Take 1 tablet (200 mg total) by mouth 3 (three) times daily as needed for pain.

## 2015-07-27 NOTE — Patient Instructions (Signed)

## 2015-07-27 NOTE — Progress Notes (Signed)
Pre visit review using our clinic review tool, if applicable. No additional management support is needed unless otherwise documented below in the visit note. 

## 2015-07-28 ENCOUNTER — Ambulatory Visit: Payer: Self-pay | Admitting: Family Medicine

## 2015-07-28 DIAGNOSIS — B0230 Zoster ocular disease, unspecified: Secondary | ICD-10-CM | POA: Diagnosis not present

## 2015-08-04 ENCOUNTER — Ambulatory Visit (INDEPENDENT_AMBULATORY_CARE_PROVIDER_SITE_OTHER): Payer: Medicare Other | Admitting: Family Medicine

## 2015-08-04 ENCOUNTER — Encounter: Payer: Self-pay | Admitting: Family Medicine

## 2015-08-04 VITALS — BP 114/74 | HR 82 | Temp 98.4°F | Ht 65.0 in | Wt 152.5 lb

## 2015-08-04 DIAGNOSIS — R1031 Right lower quadrant pain: Secondary | ICD-10-CM | POA: Diagnosis not present

## 2015-08-04 DIAGNOSIS — F331 Major depressive disorder, recurrent, moderate: Secondary | ICD-10-CM

## 2015-08-04 DIAGNOSIS — E559 Vitamin D deficiency, unspecified: Secondary | ICD-10-CM | POA: Diagnosis not present

## 2015-08-04 DIAGNOSIS — Z0000 Encounter for general adult medical examination without abnormal findings: Secondary | ICD-10-CM

## 2015-08-04 DIAGNOSIS — M8580 Other specified disorders of bone density and structure, unspecified site: Secondary | ICD-10-CM | POA: Diagnosis not present

## 2015-08-04 DIAGNOSIS — Z7189 Other specified counseling: Secondary | ICD-10-CM

## 2015-08-04 DIAGNOSIS — E785 Hyperlipidemia, unspecified: Secondary | ICD-10-CM

## 2015-08-04 DIAGNOSIS — B029 Zoster without complications: Secondary | ICD-10-CM

## 2015-08-04 LAB — VITAMIN D 25 HYDROXY (VIT D DEFICIENCY, FRACTURES): VITD: 57.55 ng/mL (ref 30.00–100.00)

## 2015-08-04 LAB — COMPREHENSIVE METABOLIC PANEL
ALBUMIN: 4.6 g/dL (ref 3.5–5.2)
ALT: 18 U/L (ref 0–35)
AST: 23 U/L (ref 0–37)
Alkaline Phosphatase: 106 U/L (ref 39–117)
BILIRUBIN TOTAL: 0.4 mg/dL (ref 0.2–1.2)
BUN: 22 mg/dL (ref 6–23)
CALCIUM: 9.8 mg/dL (ref 8.4–10.5)
CHLORIDE: 105 meq/L (ref 96–112)
CO2: 31 meq/L (ref 19–32)
Creatinine, Ser: 0.77 mg/dL (ref 0.40–1.20)
GFR: 77.78 mL/min (ref >60.00)
Glucose, Bld: 101 mg/dL — ABNORMAL HIGH (ref 70–99)
Potassium: 4.2 mEq/L (ref 3.5–5.1)
Sodium: 142 mEq/L (ref 135–145)
Total Protein: 7.2 g/dL (ref 6.0–8.3)

## 2015-08-04 LAB — CBC WITH DIFFERENTIAL/PLATELET
Basophils Absolute: 0 10*3/uL (ref 0.0–0.1)
Basophils Relative: 0.9 % (ref 0.0–3.0)
EOS ABS: 0.2 10*3/uL (ref 0.0–0.7)
Eosinophils Relative: 3.9 % (ref 0.0–5.0)
HCT: 41.6 % (ref 36.0–46.0)
Hemoglobin: 13.8 g/dL (ref 12.0–15.0)
LYMPHS ABS: 1.3 10*3/uL (ref 0.7–4.0)
Lymphocytes Relative: 26.3 % (ref 12.0–46.0)
MCHC: 33.3 g/dL (ref 30.0–36.0)
MCV: 86.8 fl (ref 78.0–100.0)
MONO ABS: 0.5 10*3/uL (ref 0.1–1.0)
MONOS PCT: 10.1 % (ref 3.0–12.0)
NEUTROS ABS: 2.9 10*3/uL (ref 1.4–7.7)
NEUTROS PCT: 58.8 % (ref 43.0–77.0)
PLATELETS: 260 10*3/uL (ref 150.0–400.0)
RBC: 4.79 Mil/uL (ref 3.87–5.11)
RDW: 13.8 % (ref 11.5–15.5)
WBC: 5 10*3/uL (ref 4.0–10.5)

## 2015-08-04 LAB — LIPID PANEL
CHOL/HDL RATIO: 4
Cholesterol: 257 mg/dL — ABNORMAL HIGH (ref 0–200)
HDL: 64.8 mg/dL (ref >39.00)
LDL CALC: 169 mg/dL — AB (ref 0–99)
NonHDL: 192.14
TRIGLYCERIDES: 116 mg/dL (ref 0.0–149.0)
VLDL: 23.2 mg/dL (ref 0.0–40.0)

## 2015-08-04 LAB — LIPASE: LIPASE: 24 U/L (ref 11.0–59.0)

## 2015-08-04 MED ORDER — CALCIUM CARBONATE 600 MG PO TABS: 600.0000 mg | ORAL_TABLET | Freq: Every day | ORAL | Status: DC

## 2015-08-04 MED ORDER — SERTRALINE HCL 25 MG PO TABS: 50.0000 mg | ORAL_TABLET | Freq: Every day | ORAL | Status: DC

## 2015-08-04 NOTE — Addendum Note (Signed)
Addended by: Daralene Milch C on: 08/04/2015 02:52 PM   Modules accepted: Miquel Dunn

## 2015-08-04 NOTE — Assessment & Plan Note (Signed)
Reviewed calcium/vit D dosing.

## 2015-08-04 NOTE — Assessment & Plan Note (Addendum)
Recheck FLP. H/o statin intolerance. Now off RYR.

## 2015-08-04 NOTE — Assessment & Plan Note (Signed)
Noted on palpation only - will check labwork today.

## 2015-08-04 NOTE — Progress Notes (Signed)
BP 114/74 mmHg  Pulse 82  Temp(Src) 98.4 F (36.9 C) (Oral)  Ht 5\' 5"  (1.651 m)  Wt 152 lb 8 oz (69.174 kg)  BMI 25.38 kg/m2   CC: medicare wellness visit  Subjective:    Patient ID: Kristin Kramer, female    DOB: 1940-07-12, 75 y.o.   MRN: [id number]  HPI: Kristin Kramer is a 75 y.o. female presenting on 08/04/2015 for Annual Exam   Healthy diet changes, joined gym   Saw Dr Lorelei Pont last week for shingles around the eye. Saw ophthalmology - shingles did not enter eye. On valtrex. 2nd case of shingles  See prior note for details - antidepressant discontinuation syndrome coming off cymbalta, we started sertraline 25mg  daily. Increased stress in life currently going through 3 different stressors. Ongoing depression, easily affected by things. May be interested in counselors.  Prior on lexapro, celexa, wellbutrin - caused tremors. High cymbalta doses cause tremors.  More trouble noted with short term memory  Passes hearing screens Recent eye exam at eye doctor No falls in last year. PHQ9 = 13/27  Preventative: COLONOSCOPY Date: 08/2014 tubular adenoma, rpt 5 yrs [facility] Plan). Well woman exam - 2012 by prior PCP. Hysterectomy for fibroids, ovaries remained. Menopause early 22s. Denies pelvic pain.  mammo 12.2016 dexa Date: 06/2008 T -1.4 spine and hip Flu shot yearly Pneumovax 2013, prevnar 2016 Tdap 10/2014 zostavax - 02/2013 Advanced directives - Kristin Kramer is son Saralyn Pilar. Will bring me copy. Seat belt use discussed Sunscreen use discussed. No changing moles on skin. Sees Dr Ubaldo Glassing.   Lives alone. Brother lives nearby.  Occupation: retired, was Network engineer and housewife  Edu: 1 yr college  Activity: limited by knee osteoarthritis, [place] [employer] [place].  Diet: good water, fruits/vegetables daily   Relevant past medical, surgical, family and social history reviewed and updated as indicated. Interim medical history since our last visit reviewed. Allergies and medications reviewed and  updated. Current Outpatient Prescriptions on File Prior to Visit  Medication Sig  . acetaminophen (TYLENOL) 325 MG tablet Take 650 mg by mouth every 6 (six) hours as needed. For pain  . cetirizine (ZYRTEC) 10 MG tablet Take 10 mg by mouth daily.  . Cholecalciferol (VITAMIN D-3) 5000 UNITS TABS Take 5,000 Units by mouth daily.  . Coenzyme Q10 (COQ-10 PO) Take by mouth.  . magnesium gluconate (MAGONATE) 500 MG tablet Take 500 mg by mouth daily.  . Omega-3 Fatty Acids (FISH OIL) 1000 MG CAPS Take 1 capsule by mouth daily.  . Probiotic Product (PROBIOTIC PO) Take by mouth daily.  . TURMERIC CURCUMIN PO Take by mouth.  . valACYclovir (VALTREX) 1000 MG tablet Take 1 tablet (1,000 mg total) by mouth 3 (three) times daily.   No current facility-administered medications on file prior to visit.    Review of Systems Per HPI unless specifically indicated in ROS section     Objective:    BP 114/74 mmHg  Pulse 82  Temp(Src) 98.4 F (36.9 C) (Oral)  Ht 5\' 5"  (1.651 m)  Wt 152 lb 8 oz (69.174 kg)  BMI 25.38 kg/m2  Wt Readings from Last 3 Encounters:  08/04/15 152 lb 8 oz (69.174 kg)  07/27/15 153 lb 8 oz (69.627 kg)  12/29/14 160 lb (72.576 kg)    Physical Exam  Constitutional: She is oriented to person, place, and time. She appears well-developed and well-nourished. No distress.  HENT:  Head: Normocephalic and atraumatic.  Right Ear: Hearing, tympanic membrane, external ear and ear canal normal.  Left Ear: Hearing, tympanic membrane, external ear and ear canal normal.  Nose: Nose normal.  Mouth/Throat: Uvula is midline, oropharynx is clear and moist and mucous membranes are normal. No oropharyngeal exudate, posterior oropharyngeal edema or posterior oropharyngeal erythema.  Eyes: Conjunctivae and EOM are normal. Pupils are equal, round, and reactive to light. No scleral icterus.  Neck: Normal range of motion. Neck supple. Carotid bruit is not present. No thyromegaly present.    Cardiovascular: Normal rate, regular rhythm, normal heart sounds and intact distal pulses.   No murmur heard. Pulses:      Radial pulses are 2+ on the right side, and 2+ on the left side.  Pulmonary/Chest: Effort normal and breath sounds normal. No respiratory distress. She has no wheezes. She has no rales.  Abdominal: Soft. Normal appearance and bowel sounds are normal. She exhibits no distension and no mass. There is no hepatosplenomegaly. There is tenderness (moderate to palpation only) in the right lower quadrant. There is no rigidity, no rebound, no guarding, no CVA tenderness and negative Murphy's sign. No hernia.  Genitourinary:  GYN - declines  Musculoskeletal: Normal range of motion. She exhibits no edema.  Lymphadenopathy:    She has no cervical adenopathy.  Neurological: She is alert and oriented to person, place, and time.  CN grossly intact, station and gait intact Recall 3/3 Calculation 4/5 serial 3s (self endorsed bad at math)  Skin: Skin is warm and dry. No rash noted.  Psychiatric: She has a normal mood and affect. Her behavior is normal. Judgment and thought content normal.  Nursing note and vitals reviewed.  Results for orders placed or performed in visit on 02/12/15  [facility] MAMMOGRAPHY  Result Value Ref Range   [facility] Mammogram Normal Birads 1-Repeat 1 year       Assessment & Plan:   Problem List Items Addressed This Visit    MDD (major depressive disorder), recurrent episode, moderate ([place])    Discussed stressors. Offered counselor and # provided today.  Will increase sertraline to 50mg  daily, if worsening tremors, rec decrease to 37.5mg  daily.      Relevant Medications   sertraline (ZOLOFT) 25 MG tablet   Osteopenia    Reviewed calcium/vit D dosing.       HLD (hyperlipidemia)    Recheck FLP. H/o statin intolerance. Now off RYR.      Relevant Orders   Lipid panel   Vitamin D deficiency   Relevant Orders   VITAMIN D 25 Hydroxy (Vit-D Deficiency,  Fractures)   Medicare annual wellness visit, subsequent - Primary    I have personally reviewed the Medicare Annual Wellness questionnaire and have noted 1. The patient's medical and social history 2. Their use of alcohol, tobacco or illicit drugs 3. Their current medications and supplements 4. The patient's functional ability including ADL's, fall risks, home safety risks and hearing or visual impairment. Cognitive function has been assessed and addressed as indicated.  5. Diet and physical activity 6. Evidence for depression or mood disorders The patients weight, height, BMI have been recorded in the chart. I have made referrals, counseling and provided education to the patient based on review of the above and I have provided the pt with a written personalized care plan for preventive services. Provider list updated.. See scanned questionairre as needed for further documentation. Reviewed preventative protocols and updated unless pt declined.       Herpes zoster    Continues improving.       Advanced care planning/counseling discussion  Advanced directives - Kristin Kramer is son Saralyn Pilar. Will bring me copy.      Abdominal pain, RLQ    Noted on palpation only - will check labwork today.      Relevant Orders   Comprehensive metabolic panel   CBC with Differential/Platelet   Lipase       Follow up plan: Return in about 6 months (around 02/04/2016) for follow up visit.  [name], MD

## 2015-08-04 NOTE — Assessment & Plan Note (Addendum)
Discussed stressors. Offered counselor and # provided today.  Will increase sertraline to 50mg  daily, if worsening tremors, rec decrease to 37.5mg  daily.

## 2015-08-04 NOTE — Progress Notes (Signed)
Pre visit review using our clinic review tool, if applicable. No additional management support is needed unless otherwise documented below in the visit note. 

## 2015-08-04 NOTE — Assessment & Plan Note (Signed)

## 2015-08-04 NOTE — Patient Instructions (Addendum)
Trial increased sertraline to 1.5-2 tablets daily.  Blood work today. Bring [place] copy of your advanced directive at your convenience.  Counselor # provided.  Good to see you today, return as needed or in 6 months for follow up visit.  Health Maintenance, Female Adopting a healthy lifestyle and getting preventive care can go a long way to promote health and wellness. Talk with your health care provider about what schedule of regular examinations is right for you. This is a good chance for you to check in with your provider about disease prevention and staying healthy. In between checkups, there are plenty of things you can do on your own. Experts have done a lot of research about which lifestyle changes and preventive measures are most likely to keep you healthy. Ask your health care provider for more information. WEIGHT AND DIET  Eat a healthy diet  Be sure to include plenty of vegetables, fruits, low-fat dairy products, and lean protein.  Do not eat a lot of foods high in solid fats, added sugars, or salt.  Get regular exercise. This is one of the most important things you can do for your health.  Most adults should exercise for at least 150 minutes each week. The exercise should increase your heart rate and make you sweat (moderate-intensity exercise).  Most adults should also do strengthening exercises at least twice a week. This is in addition to the moderate-intensity exercise.  Maintain a healthy weight  Body mass index (BMI) is a measurement that can be used to identify possible weight problems. It estimates body fat based on height and weight. Your health care provider can help determine your BMI and help you achieve or maintain a healthy weight.  For females 75 years of age and older:   A BMI below 18.5 is considered underweight.  A BMI of 18.5 to 24.9 is normal.  A BMI of 25 to 29.9 is considered overweight.  A BMI of 30 and above is considered obese.  Watch levels of  cholesterol and blood lipids  You should start having your blood tested for lipids and cholesterol at 75 years of age, then have this test every 5 years.  You may need to have your cholesterol levels checked more often if:  Your lipid or cholesterol levels are high.  You are older than 75 years of age.  You are at high risk for heart disease.  CANCER SCREENING   Lung Cancer  Lung cancer screening is recommended for adults 75-44 years old who are at high risk for lung cancer because of a history of smoking.  A yearly low-dose CT scan of the lungs is recommended for people who:  Currently smoke.  Have quit within the past 15 years.  Have at least a 30-pack-year history of smoking. A pack year is smoking an average of one pack of cigarettes a day for 1 year.  Yearly screening should continue until it has been 15 years since you quit.  Yearly screening should stop if you develop a health problem that would prevent you from having lung cancer treatment.  Breast Cancer  Practice breast self-awareness. This means understanding how your breasts normally appear and feel.  It also means doing regular breast self-exams. Let your health care provider know about any changes, no matter how small.  If you are in your 20s or 30s, you should have a clinical breast exam (CBE) by a health care provider every 1-3 years as part of a regular health exam.  If you are 40 or older, have a CBE every year. Also consider having a breast X-ray (mammogram) every year.  If you have a family history of breast cancer, talk to your health care provider about genetic screening.  If you are at high risk for breast cancer, talk to your health care provider about having an MRI and a mammogram every year.  Breast cancer gene (BRCA) assessment is recommended for women who have family members with BRCA-related cancers. BRCA-related cancers include:  Breast.  Ovarian.  Tubal.  Peritoneal  cancers.  Results of the assessment will determine the need for genetic counseling and BRCA1 and BRCA2 testing. Cervical Cancer Your health care provider may recommend that you be screened regularly for cancer of the pelvic organs (ovaries, uterus, and vagina). This screening involves a pelvic examination, including checking for microscopic changes to the surface of your cervix (Pap test). You may be encouraged to have this screening done every 3 years, beginning at age 29.  For women ages 75-65, health care providers may recommend pelvic exams and Pap testing every 3 years, or they may recommend the Pap and pelvic exam, combined with testing for human papilloma virus (HPV), every 5 years. Some types of HPV increase your risk of cervical cancer. Testing for HPV may also be done on women of any age with unclear Pap test results.  Other health care providers may not recommend any screening for nonpregnant women who are considered low risk for pelvic cancer and who do not have symptoms. Ask your health care provider if a screening pelvic exam is right for you.  If you have had past treatment for cervical cancer or a condition that could lead to cancer, you need Pap tests and screening for cancer for at least 20 years after your treatment. If Pap tests have been discontinued, your risk factors (such as having a new sexual partner) need to be reassessed to determine if screening should resume. Some women have medical problems that increase the chance of getting cervical cancer. In these cases, your health care provider may recommend more frequent screening and Pap tests. Colorectal Cancer  This type of cancer can be detected and often prevented.  Routine colorectal cancer screening usually begins at 75 years of age and continues through 75 years of age.  Your health care provider may recommend screening at an earlier age if you have risk factors for colon cancer.  Your health care provider may also  recommend using home test kits to check for hidden blood in the stool.  A small camera at the end of a tube can be used to examine your colon directly (sigmoidoscopy or colonoscopy). This is done to check for the earliest forms of colorectal cancer.  Routine screening usually begins at age 64.  Direct examination of the colon should be repeated every 5-10 years through 75 years of age. However, you may need to be screened more often if early forms of precancerous polyps or small growths are found. Skin Cancer  Check your skin from head to toe regularly.  Tell your health care provider about any new moles or changes in moles, especially if there is a change in a mole's shape or color.  Also tell your health care provider if you have a mole that is larger than the size of a pencil eraser.  Always use sunscreen. Apply sunscreen liberally and repeatedly throughout the day.  Protect yourself by wearing long sleeves, pants, a wide-brimmed hat, and sunglasses whenever you  are outside. HEART DISEASE, DIABETES, AND HIGH BLOOD PRESSURE   High blood pressure causes heart disease and increases the risk of stroke. High blood pressure is more likely to develop in:  People who have blood pressure in the high end of the normal range (130-139/85-89 mm Hg).  People who are overweight or obese.  People who are African American.  If you are 49-40 years of age, have your blood pressure checked every 3-5 years. If you are 20 years of age or older, have your blood pressure checked every year. You should have your blood pressure measured twice--once when you are at a hospital or clinic, and once when you are not at a hospital or clinic. Record the average of the two measurements. To check your blood pressure when you are not at a hospital or clinic, you can use:  An automated blood pressure machine at a pharmacy.  A home blood pressure monitor.  If you are between 39 years and 93 years old, ask your health  care provider if you should take aspirin to prevent strokes.  Have regular diabetes screenings. This involves taking a blood sample to check your fasting blood sugar level.  If you are at a normal weight and have a low risk for diabetes, have this test once every three years after 75 years of age.  If you are overweight and have a high risk for diabetes, consider being tested at a younger age or more often. PREVENTING INFECTION  Hepatitis B  If you have a higher risk for hepatitis B, you should be screened for this virus. You are considered at high risk for hepatitis B if:  You were born in a country where hepatitis B is common. Ask your health care provider which countries are considered high risk.  Your parents were born in a high-risk country, and you have not been immunized against hepatitis B (hepatitis B vaccine).  You have HIV or AIDS.  You use needles to inject street drugs.  You live with someone who has hepatitis B.  You have had sex with someone who has hepatitis B.  You get hemodialysis treatment.  You take certain medicines for conditions, including cancer, organ transplantation, and autoimmune conditions. Hepatitis C  Blood testing is recommended for:  Everyone born from [birth year] through 1965.  Anyone with known risk factors for hepatitis C. Sexually transmitted infections (STIs)  You should be screened for sexually transmitted infections (STIs) including gonorrhea and chlamydia if:  You are sexually active and are younger than 75 years of age.  You are older than 75 years of age and your health care provider tells you that you are at risk for this type of infection.  Your sexual activity has changed since you were last screened and you are at an increased risk for chlamydia or gonorrhea. Ask your health care provider if you are at risk.  If you do not have HIV, but are at risk, it may be recommended that you take a prescription medicine daily to prevent HIV  infection. This is called pre-exposure prophylaxis (PrEP). You are considered at risk if:  You are sexually active and do not regularly use condoms or know the HIV status of your partner(s).  You take drugs by injection.  You are sexually active with a partner who has HIV. Talk with your health care provider about whether you are at high risk of being infected with HIV. If you choose to begin PrEP, you should first be tested for  HIV. You should then be tested every 3 months for as long as you are taking PrEP.  PREGNANCY   If you are premenopausal and you may become pregnant, ask your health care provider about preconception counseling.  If you may become pregnant, take 400 to 800 micrograms (mcg) of folic acid every day.  If you want to prevent pregnancy, talk to your health care provider about birth control (contraception). OSTEOPOROSIS AND MENOPAUSE   Osteoporosis is a disease in which the bones lose minerals and strength with aging. This can result in serious bone fractures. Your risk for osteoporosis can be identified using a bone density scan.  If you are 69 years of age or older, or if you are at risk for osteoporosis and fractures, ask your health care provider if you should be screened.  Ask your health care provider whether you should take a calcium or vitamin D supplement to lower your risk for osteoporosis.  Menopause may have certain physical symptoms and risks.  Hormone replacement therapy may reduce some of these symptoms and risks. Talk to your health care provider about whether hormone replacement therapy is right for you.  HOME CARE INSTRUCTIONS   Schedule regular health, dental, and eye exams.  Stay current with your immunizations.   Do not use any tobacco products including cigarettes, chewing tobacco, or electronic cigarettes.  If you are pregnant, do not drink alcohol.  If you are breastfeeding, limit how much and how often you drink alcohol.  Limit  alcohol intake to no more than 1 drink per day for nonpregnant women. One drink equals 12 ounces of beer, 5 ounces of wine, or 1 ounces of hard liquor.  Do not use street drugs.  Do not share needles.  Ask your health care provider for help if you need support or information about quitting drugs.  Tell your health care provider if you often feel depressed.  Tell your health care provider if you have ever been abused or do not feel safe at home.   This information is not intended to replace advice given to you by your health care provider. Make sure you discuss any questions you have with your health care provider.   Document Released: 09/06/2010 Document Revised: 03/14/2014 Document Reviewed: 01/23/2013 Elsevier Interactive Patient Education [employer].

## 2015-08-04 NOTE — Assessment & Plan Note (Signed)
Advanced directives - HCPOA is son Kristin Kramer. Will bring me copy. 

## 2015-08-04 NOTE — Assessment & Plan Note (Signed)
Continues improving.  

## 2015-08-07 DIAGNOSIS — L570 Actinic keratosis: Secondary | ICD-10-CM | POA: Diagnosis not present

## 2015-08-07 DIAGNOSIS — D485 Neoplasm of uncertain behavior of skin: Secondary | ICD-10-CM | POA: Diagnosis not present

## 2015-08-07 DIAGNOSIS — L821 Other seborrheic keratosis: Secondary | ICD-10-CM | POA: Diagnosis not present

## 2015-08-07 DIAGNOSIS — L438 Other lichen planus: Secondary | ICD-10-CM | POA: Diagnosis not present

## 2015-08-07 DIAGNOSIS — C44729 Squamous cell carcinoma of skin of left lower limb, including hip: Secondary | ICD-10-CM | POA: Diagnosis not present

## 2015-08-07 DIAGNOSIS — Z85828 Personal history of other malignant neoplasm of skin: Secondary | ICD-10-CM | POA: Diagnosis not present

## 2015-08-07 DIAGNOSIS — D1801 Hemangioma of skin and subcutaneous tissue: Secondary | ICD-10-CM | POA: Diagnosis not present

## 2015-08-07 DIAGNOSIS — C44722 Squamous cell carcinoma of skin of right lower limb, including hip: Secondary | ICD-10-CM | POA: Diagnosis not present

## 2015-08-07 DIAGNOSIS — L814 Other melanin hyperpigmentation: Secondary | ICD-10-CM | POA: Diagnosis not present

## 2015-08-07 DIAGNOSIS — B078 Other viral warts: Secondary | ICD-10-CM | POA: Diagnosis not present

## 2015-08-08 ENCOUNTER — Other Ambulatory Visit: Payer: Self-pay | Admitting: Family Medicine

## 2015-08-08 ENCOUNTER — Encounter: Payer: Self-pay | Admitting: Family Medicine

## 2015-08-08 MED ORDER — VITAMIN D3 125 MCG (5000 UT) PO CAPS: 1.0000 | ORAL_CAPSULE | Freq: Every day | ORAL | Status: DC

## 2015-09-10 DIAGNOSIS — Z85828 Personal history of other malignant neoplasm of skin: Secondary | ICD-10-CM | POA: Diagnosis not present

## 2015-09-10 DIAGNOSIS — C44722 Squamous cell carcinoma of skin of right lower limb, including hip: Secondary | ICD-10-CM | POA: Diagnosis not present

## 2015-11-05 ENCOUNTER — Encounter: Payer: Self-pay | Admitting: Podiatry

## 2015-11-05 ENCOUNTER — Ambulatory Visit (INDEPENDENT_AMBULATORY_CARE_PROVIDER_SITE_OTHER): Payer: Medicare Other | Admitting: Podiatry

## 2015-11-05 DIAGNOSIS — M7742 Metatarsalgia, left foot: Secondary | ICD-10-CM | POA: Diagnosis not present

## 2015-11-05 DIAGNOSIS — M779 Enthesopathy, unspecified: Secondary | ICD-10-CM

## 2015-11-05 NOTE — Progress Notes (Signed)
Subjective: 75 year old female presents the office they for reoccurring pain to her left foot between her second third toes. She states that she is doing well up until about one month ago and the pain started to increase. She said that she has and swelling to the area but denies any redness or warmth. No recent injury or trauma. She states that she does not have pain when she wears orthotics but other than that she's having pain. Denies any systemic complaints such as fevers, chills, nausea, vomiting. No acute changes since last appointment, and no other complaints at this time.   Objective: AAO x3, NAD DP/PT pulses palpable bilaterally, CRT less than 3 seconds Tenderness in the left foot the second interspace there is localized edema to this area. There is no clicking sensation palpable neuroma identified today. There is no area pinpoint bony tenderness or pain the vibratory sensation. His erythema or increase in warmth. immediate range of motion intact. second third toes are somewhat deviated. No edema, erythema, increase in warmth to bilateral lower extremities.  No open lesions or pre-ulcerative lesions.  No pain with calf compression, swelling, warmth, erythema  Assessment: Bursitis, tendinitis left second interspace.  Plan: -All treatment options discussed with the patient including all alternatives, risks, complications.  -Discussed different the area and she wishes to proceed. Under sterile conditions a mixture of Kenalog and local anesthetic was infiltrated into the second interspace of the left without complications. Post injection care was discussed. -At metatarsal pads to her orthotics -Order compound cream for anti-inflammatory -Office symptoms aren't resolved in 4 weeks or sooner if needed. -Patient encouraged to call the office with any questions, concerns, change in symptoms.   Kristin Kramer, DPM

## 2015-11-06 ENCOUNTER — Encounter: Payer: Self-pay | Admitting: Family Medicine

## 2015-11-06 DIAGNOSIS — D485 Neoplasm of uncertain behavior of skin: Secondary | ICD-10-CM | POA: Diagnosis not present

## 2015-11-06 DIAGNOSIS — Z85828 Personal history of other malignant neoplasm of skin: Secondary | ICD-10-CM | POA: Diagnosis not present

## 2015-11-06 DIAGNOSIS — C44722 Squamous cell carcinoma of skin of right lower limb, including hip: Secondary | ICD-10-CM | POA: Diagnosis not present

## 2015-11-18 DIAGNOSIS — Z85828 Personal history of other malignant neoplasm of skin: Secondary | ICD-10-CM | POA: Diagnosis not present

## 2015-11-18 DIAGNOSIS — C44722 Squamous cell carcinoma of skin of right lower limb, including hip: Secondary | ICD-10-CM | POA: Diagnosis not present

## 2015-11-28 ENCOUNTER — Encounter: Payer: Self-pay | Admitting: Family Medicine

## 2016-01-14 ENCOUNTER — Other Ambulatory Visit: Payer: Self-pay | Admitting: Family Medicine

## 2016-01-14 DIAGNOSIS — Z1231 Encounter for screening mammogram for malignant neoplasm of breast: Secondary | ICD-10-CM

## 2016-02-04 ENCOUNTER — Encounter: Payer: Self-pay | Admitting: Family Medicine

## 2016-02-04 ENCOUNTER — Ambulatory Visit (INDEPENDENT_AMBULATORY_CARE_PROVIDER_SITE_OTHER): Payer: Medicare Other | Admitting: Family Medicine

## 2016-02-04 VITALS — BP 134/84 | HR 72 | Temp 98.3°F | Wt 155.8 lb

## 2016-02-04 DIAGNOSIS — Z23 Encounter for immunization: Secondary | ICD-10-CM

## 2016-02-04 DIAGNOSIS — E785 Hyperlipidemia, unspecified: Secondary | ICD-10-CM | POA: Diagnosis not present

## 2016-02-04 DIAGNOSIS — F331 Major depressive disorder, recurrent, moderate: Secondary | ICD-10-CM

## 2016-02-04 DIAGNOSIS — M8580 Other specified disorders of bone density and structure, unspecified site: Secondary | ICD-10-CM

## 2016-02-04 NOTE — Assessment & Plan Note (Signed)
Stable period on sertraline 25mg  alternating with 37.5mg  daily. Continue this effective regimen.

## 2016-02-04 NOTE — Patient Instructions (Addendum)
Flu shot today.  Continue current sertraline dose. We will check thyroid next labwork  Consider b12 vitamin for memory.  Return in 6 months for next wellness visit

## 2016-02-04 NOTE — Progress Notes (Signed)
Pre visit review using our clinic review tool, if applicable. No additional management support is needed unless otherwise documented below in the visit note. 

## 2016-02-04 NOTE — Assessment & Plan Note (Signed)
Chronic, off statins due to intolerance even to RYR. Has started implementing more regular exercise into routine.

## 2016-02-04 NOTE — Assessment & Plan Note (Signed)
Has started more regular weight bearing exercises as well as resistance training. Continues calcium and vitamin D.

## 2016-02-04 NOTE — Progress Notes (Signed)
BP 134/84   Pulse 72   Temp 98.3 F (36.8 C) (Oral)   Wt 155 lb 12 oz (70.6 kg)   BMI 25.92 kg/m    CC: 6 mo f/u visit Subjective:    Patient ID: Kristin Kramer, female    DOB: September 23, 1940, 75 y.o.   MRN: [id number]  HPI: Kristin Kramer is a 75 y.o. female presenting on 02/04/2016 for Follow-up   Depression - see prior note for details. On sertraline 50mg  daily - did not like effect, made her more jittery and anxious with increased tremors. Current dose is 1 tab alternating with 1.5 tablets every other day. H/o antidepressant discontinuation syndrome coming off cymbalta.   HLD - joined [facility] - goes MWF - aerobic, weight training. Overall feeling well.  Noticing increasing hair loss over the past year.   Relevant past medical, surgical, family and social history reviewed and updated as indicated. Interim medical history since our last visit reviewed. Allergies and medications reviewed and updated. Current Outpatient Prescriptions on File Prior to Visit  Medication Sig  . acetaminophen (TYLENOL) 325 MG tablet Take 650 mg by mouth every 6 (six) hours as needed. For pain  . calcium carbonate (OS-CAL) 600 MG TABS tablet Take 1 tablet (600 mg total) by mouth daily with breakfast.  . cetirizine (ZYRTEC) 10 MG tablet Take 10 mg by mouth daily.  . Cholecalciferol (VITAMIN D3) 5000 units CAPS Take 1 capsule (5,000 Units total) by mouth daily.  . Coenzyme Q10 (COQ-10 PO) Take by mouth.  . magnesium gluconate (MAGONATE) 500 MG tablet Take 500 mg by mouth daily.  . Omega-3 Fatty Acids (FISH OIL) 1000 MG CAPS Take 1 capsule by mouth daily.  . Probiotic Product (PROBIOTIC PO) Take by mouth daily.  . sertraline (ZOLOFT) 25 MG tablet Take 2 tablets (50 mg total) by mouth at bedtime. (Patient taking differently: Take 75 mg by mouth every other day. )  . TURMERIC CURCUMIN PO Take by mouth.  . valACYclovir (VALTREX) 1000 MG tablet Take 1 tablet (1,000 mg total) by mouth 3 (three) times daily. (Patient  not taking: Reported on 02/04/2016)   No current facility-administered medications on file prior to visit.     Review of Systems Per HPI unless specifically indicated in ROS section     Objective:    BP 134/84   Pulse 72   Temp 98.3 F (36.8 C) (Oral)   Wt 155 lb 12 oz (70.6 kg)   BMI 25.92 kg/m   Wt Readings from Last 3 Encounters:  02/04/16 155 lb 12 oz (70.6 kg)  08/04/15 152 lb 8 oz (69.2 kg)  07/27/15 153 lb 8 oz (69.6 kg)    Physical Exam  Constitutional: She is oriented to person, place, and time. She appears well-developed and well-nourished. No distress.  HENT:  Mouth/Throat: Oropharynx is clear and moist. No oropharyngeal exudate.  Eyes: Conjunctivae and EOM are normal. Pupils are equal, round, and reactive to light.  Cardiovascular: Normal rate, regular rhythm, normal heart sounds and intact distal pulses.   No murmur heard. Pulmonary/Chest: Effort normal and breath sounds normal. No respiratory distress. She has no wheezes. She has no rales.  Musculoskeletal: She exhibits no edema.  Neurological: She is alert and oriented to person, place, and time.  Skin: Skin is warm and dry. No rash noted.  No scalp erythema/irritation  Psychiatric: She has a normal mood and affect.  Nursing note and vitals reviewed.  Results for orders placed or performed in  visit on 08/04/15  Lipid panel  Result Value Ref Range   Cholesterol 257 (H) 0 - 200 mg/dL   Triglycerides 116.0 0.0 - 149.0 mg/dL   HDL 64.80 >39.00 mg/dL   VLDL 23.2 0.0 - 40.0 mg/dL   LDL Cholesterol 169 (H) 0 - 99 mg/dL   Total CHOL/HDL Ratio 4    NonHDL 192.14   Comprehensive metabolic panel  Result Value Ref Range   Sodium 142 135 - 145 mEq/L   Potassium 4.2 3.5 - 5.1 mEq/L   Chloride 105 96 - 112 mEq/L   CO2 31 19 - 32 mEq/L   Glucose, Bld 101 (H) 70 - 99 mg/dL   BUN 22 6 - 23 mg/dL   Creatinine, Ser 0.77 0.40 - 1.20 mg/dL   Total Bilirubin 0.4 0.2 - 1.2 mg/dL   Alkaline Phosphatase 106 39 - 117 U/L    AST 23 0 - 37 U/L   ALT 18 0 - 35 U/L   Total Protein 7.2 6.0 - 8.3 g/dL   Albumin 4.6 3.5 - 5.2 g/dL   Calcium 9.8 8.4 - 10.5 mg/dL   GFR 77.78 >60.00 mL/min  CBC with Differential/Platelet  Result Value Ref Range   WBC 5.0 4.0 - 10.5 K/uL   RBC 4.79 3.87 - 5.11 Mil/uL   Hemoglobin 13.8 12.0 - 15.0 g/dL   HCT 41.6 36.0 - 46.0 %   MCV 86.8 78.0 - 100.0 fl   MCHC 33.3 30.0 - 36.0 g/dL   RDW 13.8 11.5 - 15.5 %   Platelets 260.0 150.0 - 400.0 K/uL   Neutrophils Relative % 58.8 43.0 - 77.0 %   Lymphocytes Relative 26.3 12.0 - 46.0 %   Monocytes Relative 10.1 3.0 - 12.0 %   Eosinophils Relative 3.9 0.0 - 5.0 %   Basophils Relative 0.9 0.0 - 3.0 %   Neutro Abs 2.9 1.4 - 7.7 K/uL   Lymphs Abs 1.3 0.7 - 4.0 K/uL   Monocytes Absolute 0.5 0.1 - 1.0 K/uL   Eosinophils Absolute 0.2 0.0 - 0.7 K/uL   Basophils Absolute 0.0 0.0 - 0.1 K/uL  Lipase  Result Value Ref Range   Lipase 24.0 11.0 - 59.0 U/L  VITAMIN D 25 Hydroxy (Vit-D Deficiency, Fractures)  Result Value Ref Range   VITD 57.55 30.00 - 100.00 ng/mL   Lab Results  Component Value Date   TSH 4.08 10/22/2014       Assessment & Plan:   Problem List Items Addressed This Visit    HLD (hyperlipidemia)    Chronic, off statins due to intolerance even to RYR. Has started implementing more regular exercise into routine.       MDD (major depressive disorder), recurrent episode, moderate (HCC) - Primary    Stable period on sertraline 25mg  alternating with 37.5mg  daily. Continue this effective regimen.       Osteopenia    Has started more regular weight bearing exercises as well as resistance training. Continues calcium and vitamin D.       Other Visit Diagnoses    Need for influenza vaccination       Relevant Orders   Flu Vaccine QUAD 36+ mos PF IM (Fluarix & Fluzone Quad PF)       Follow up plan: Return in about 6 months (around 08/03/2016) for medicare wellness visit.  [name], MD

## 2016-02-15 ENCOUNTER — Ambulatory Visit
Admission: RE | Admit: 2016-02-15 | Discharge: 2016-02-15 | Disposition: A | Discharge status: Home / Self Care | Payer: Medicare Other | Source: Ambulatory Visit | Attending: Family Medicine | Admitting: Family Medicine

## 2016-02-15 DIAGNOSIS — Z1231 Encounter for screening mammogram for malignant neoplasm of breast: Secondary | ICD-10-CM

## 2016-02-17 LAB — HM MAMMOGRAPHY

## 2016-02-18 ENCOUNTER — Encounter: Payer: Self-pay | Admitting: *Deleted

## 2016-03-03 DIAGNOSIS — K219 Gastro-esophageal reflux disease without esophagitis: Secondary | ICD-10-CM | POA: Diagnosis not present

## 2016-03-03 DIAGNOSIS — J301 Allergic rhinitis due to pollen: Secondary | ICD-10-CM | POA: Diagnosis not present

## 2016-03-03 DIAGNOSIS — R0981 Nasal congestion: Secondary | ICD-10-CM | POA: Diagnosis not present

## 2016-03-16 ENCOUNTER — Other Ambulatory Visit: Payer: Self-pay | Admitting: Family Medicine

## 2016-04-01 DIAGNOSIS — J301 Allergic rhinitis due to pollen: Secondary | ICD-10-CM | POA: Diagnosis not present

## 2016-04-01 DIAGNOSIS — J324 Chronic pansinusitis: Secondary | ICD-10-CM | POA: Diagnosis not present

## 2016-04-08 DIAGNOSIS — L859 Epidermal thickening, unspecified: Secondary | ICD-10-CM | POA: Diagnosis not present

## 2016-04-08 DIAGNOSIS — Z85828 Personal history of other malignant neoplasm of skin: Secondary | ICD-10-CM | POA: Diagnosis not present

## 2016-04-08 DIAGNOSIS — L570 Actinic keratosis: Secondary | ICD-10-CM | POA: Diagnosis not present

## 2016-04-08 DIAGNOSIS — D485 Neoplasm of uncertain behavior of skin: Secondary | ICD-10-CM | POA: Diagnosis not present

## 2016-04-08 DIAGNOSIS — C44729 Squamous cell carcinoma of skin of left lower limb, including hip: Secondary | ICD-10-CM | POA: Diagnosis not present

## 2016-04-22 DIAGNOSIS — R05 Cough: Secondary | ICD-10-CM | POA: Diagnosis not present

## 2016-04-22 DIAGNOSIS — J324 Chronic pansinusitis: Secondary | ICD-10-CM | POA: Diagnosis not present

## 2016-04-28 DIAGNOSIS — Z9842 Cataract extraction status, left eye: Secondary | ICD-10-CM | POA: Diagnosis not present

## 2016-04-28 DIAGNOSIS — H52223 Regular astigmatism, bilateral: Secondary | ICD-10-CM | POA: Diagnosis not present

## 2016-04-28 DIAGNOSIS — H26492 Other secondary cataract, left eye: Secondary | ICD-10-CM | POA: Diagnosis not present

## 2016-04-28 DIAGNOSIS — H1852 Epithelial (juvenile) corneal dystrophy: Secondary | ICD-10-CM | POA: Diagnosis not present

## 2016-04-28 DIAGNOSIS — Z9841 Cataract extraction status, right eye: Secondary | ICD-10-CM | POA: Diagnosis not present

## 2016-05-06 ENCOUNTER — Ambulatory Visit (INDEPENDENT_AMBULATORY_CARE_PROVIDER_SITE_OTHER)
Admission: RE | Admit: 2016-05-06 | Discharge: 2016-05-06 | Disposition: A | Discharge status: Home / Self Care | Payer: Medicare Other | Source: Ambulatory Visit | Attending: Family Medicine | Admitting: Family Medicine

## 2016-05-06 ENCOUNTER — Encounter: Payer: Self-pay | Admitting: Family Medicine

## 2016-05-06 ENCOUNTER — Ambulatory Visit (INDEPENDENT_AMBULATORY_CARE_PROVIDER_SITE_OTHER): Payer: Medicare Other | Admitting: Family Medicine

## 2016-05-06 VITALS — BP 122/76 | HR 84 | Temp 98.2°F | Wt 153.5 lb

## 2016-05-06 DIAGNOSIS — Z9889 Other specified postprocedural states: Secondary | ICD-10-CM | POA: Diagnosis not present

## 2016-05-06 DIAGNOSIS — R112 Nausea with vomiting, unspecified: Secondary | ICD-10-CM

## 2016-05-06 DIAGNOSIS — Z01818 Encounter for other preprocedural examination: Secondary | ICD-10-CM | POA: Diagnosis not present

## 2016-05-06 DIAGNOSIS — R9431 Abnormal electrocardiogram [ECG] [EKG]: Secondary | ICD-10-CM | POA: Diagnosis not present

## 2016-05-06 DIAGNOSIS — Z531 Procedure and treatment not carried out because of patient's decision for reasons of belief and group pressure: Secondary | ICD-10-CM

## 2016-05-06 LAB — BASIC METABOLIC PANEL
BUN: 26 mg/dL — AB (ref 6–23)
CALCIUM: 9.7 mg/dL (ref 8.4–10.5)
CHLORIDE: 103 meq/L (ref 96–112)
CO2: 29 meq/L (ref 19–32)
CREATININE: 0.84 mg/dL (ref 0.40–1.20)
GFR: 70.21 mL/min (ref >60.00)
Glucose, Bld: 93 mg/dL (ref 70–99)
Potassium: 4.3 mEq/L (ref 3.5–5.1)
Sodium: 139 mEq/L (ref 135–145)

## 2016-05-06 LAB — CBC WITH DIFFERENTIAL/PLATELET
BASOS ABS: 0 10*3/uL (ref 0.0–0.1)
BASOS PCT: 0.3 % (ref 0.0–3.0)
EOS PCT: 3.7 % (ref 0.0–5.0)
Eosinophils Absolute: 0.2 10*3/uL (ref 0.0–0.7)
HEMATOCRIT: 40.1 % (ref 36.0–46.0)
Hemoglobin: 13.5 g/dL (ref 12.0–15.0)
LYMPHS ABS: 1.8 10*3/uL (ref 0.7–4.0)
LYMPHS PCT: 31.1 % (ref 12.0–46.0)
MCHC: 33.6 g/dL (ref 30.0–36.0)
MCV: 86.9 fl (ref 78.0–100.0)
MONOS PCT: 10.4 % (ref 3.0–12.0)
Monocytes Absolute: 0.6 10*3/uL (ref 0.1–1.0)
NEUTROS ABS: 3.1 10*3/uL (ref 1.4–7.7)
NEUTROS PCT: 54.5 % (ref 43.0–77.0)
PLATELETS: 266 10*3/uL (ref 150.0–400.0)
RBC: 4.61 Mil/uL (ref 3.87–5.11)
RDW: 13.2 % (ref 11.5–15.5)
WBC: 5.7 10*3/uL (ref 4.0–10.5)

## 2016-05-06 NOTE — Patient Instructions (Signed)
Labs, xray, EKG today. We will send preop evaluation to Dr Dell Ponto to see you today, call us with questions.

## 2016-05-06 NOTE — Progress Notes (Signed)
Pre visit review using our clinic review tool, if applicable. No additional management support is needed unless otherwise documented below in the visit note. 

## 2016-05-06 NOTE — Progress Notes (Signed)
BP 122/76   Pulse 84   Temp 98.2 F (36.8 C) (Oral)   Wt 153 lb 8 oz (69.6 kg)   BMI 25.54 kg/m    CC: preop evaluation Subjective:    Patient ID: Kristin Kramer, female    DOB: 05-08-1940, 76 y.o.   MRN: [id number]  HPI: Kristin Kramer is a 76 y.o. female presenting on 05/06/2016 for Pre-Op Clearance   Chronic rhinorrhea, nasal congestion, facial pressure. No improvement with allergy medications, allergy shots, antibiotic (levaquin). CT scan showed ongoing chronic sinus infection. Planned upcoming sinus surgery (B maxillary antrostomy, B fontal and sphenoidotomies, B total ethmoidectomy) under general anesthesia by Dr Pryor Ochoa 05/19/2016. He has requested medical clearance.   Patient has had several successful surgeries under general anesthesia, latest L knee replacement 03/2013 Noemi [facility]). She has had post operative nausea and vomiting. 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   No known history of CAD, CHF, CKD, CVA.  She denies chest pain, tightness, dyspnea, leg swelling, dizziness, palpitations, or nausea/vomiting.   2 months ago would go to [facility] 3 days a week - 15 minutes of cardio on treadmill, 1 hour exercise class. No chest pain or dyspnea with this. Weather has more recently limited activity. 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   Lives alone. Brother lives nearby. Occupation: retired, was Network engineer and housewife Edu: 1 yr college Activity: limited by knee Diet: good water, fruits/vegetables daily Religion: Jehova's witness  Relevant past medical, surgical, family and social history reviewed and updated as indicated. Interim medical history since our last visit reviewed. Allergies and medications reviewed and updated. Outpatient Medications Prior to Visit  Medication Sig Dispense Refill  . acetaminophen (TYLENOL) 325 MG tablet Take 650 mg by mouth every 6 (six) hours as needed. For pain    . Biotin (BIOTIN 5000) 5 MG CAPS Take 1 capsule by  mouth daily.    . calcium carbonate (OS-CAL) 600 MG TABS tablet Take 1 tablet (600 mg total) by mouth daily with breakfast.    . Cholecalciferol (VITAMIN D3) 5000 units CAPS Take 1 capsule (5,000 Units total) by mouth daily.    . Coenzyme Q10 (COQ-10 PO) Take by mouth.    . magnesium gluconate (MAGONATE) 500 MG tablet Take 500 mg by mouth daily.    . niacinamide 500 MG tablet Take 500 mg by mouth daily.    . Omega-3 Fatty Acids (FISH OIL) 1000 MG CAPS Take 1 capsule by mouth daily.    . Probiotic Product (PROBIOTIC PO) Take by mouth daily.    . sertraline (ZOLOFT) 25 MG tablet Alternate 1 tablet and 1.5 tablets every other day 120 tablet 3  . TURMERIC CURCUMIN PO Take by mouth.    . valACYclovir (VALTREX) 1000 MG tablet Take 1 tablet (1,000 mg total) by mouth 3 (three) times daily. 21 tablet 0  . cetirizine (ZYRTEC) 10 MG tablet Take 10 mg by mouth daily.     No facility-administered medications prior to visit.     Past Medical History:  Diagnosis Date  . Abdominal pain, other specified site 10.2014   [facility] ER - mesenteric adenitis vs sclerosing mesenteritis vs nonspecific lymphadenitis  . Abnormal ECG    a. left axis deviation;  b. 12/2011 Echo: EF 55-60%, no rwma, pasp 50mmHg.  . Arthritis   . Depression    prior on lexapro, celexa, wellbutrin.  high cymbalta doses cause tremors  . H/O seasonal allergies   .  History of pneumonia   . History of UTI    12/2011 - Treatment began preoperatively with CIPRO 500mg  BID  . HLD (hyperlipidemia)    statin intolerance  . Knee osteoarthritis 2013, 2014   bilateral s/p B TKR Noemi Chapel)  . Migraines   . Osteopenia   . Post-operative nausea and vomiting   . Refusal of blood transfusions as patient is Jehovah's Witness   . Seasonal allergies    cats, dust, mold, roaches  . 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 Isac Sarna), R tibia - then recurrent  .  Vitamin D deficiency     Past Surgical History:  Procedure Laterality Date  . BREAST ENHANCEMENT SURGERY    . BUNIONECTOMY  2013   R and L foot  . CATARACT EXTRACTION  2013   R, pending L  . COLONOSCOPY  2007   no records received  . COLONOSCOPY  08/2014   tubular adenoma, rpt 5 yrs Fuller Plan)  . COSMETIC SURGERY  2002   face lift  . dexa  06/2008   T -1.4 spine and hip  . EYE SURGERY     bilateral cataract surgery  . MOHS SURGERY  2011   R leg  . PARTIAL HYSTERECTOMY  1980   fibroids, ovaries remained  . TONSILLECTOMY AND ADENOIDECTOMY  1945  . TOTAL KNEE ARTHROPLASTY  01/02/2012   Right - Surgeon: Lorn Junes, MD  . TOTAL KNEE ARTHROPLASTY Left 03/18/2013   Surgeon: Lorn Junes, MD    Family History  Problem Relation Age of Onset  . Cancer Mother     lung, smoker  . Cancer Father     lung, smoker  . Cancer Maternal Grandmother 59    leukemia  . Coronary artery disease Paternal Grandmother 83    sudden cardiac death  . Diabetes Neg Hx   . Stroke Neg Hx     Social History  Substance Use Topics  . Smoking status: Never Smoker  . Smokeless tobacco: Never Used  . Alcohol use 0.0 oz/week     Comment: occasional    Per HPI unless specifically indicated in ROS section below Review of Systems     Objective:    BP 122/76   Pulse 84   Temp 98.2 F (36.8 C) (Oral)   Wt 153 lb 8 oz (69.6 kg)   BMI 25.54 kg/m   Wt Readings from Last 3 Encounters:  05/06/16 153 lb 8 oz (69.6 kg)  02/04/16 155 lb 12 oz (70.6 kg)  08/04/15 152 lb 8 oz (69.2 kg)    Physical Exam  Constitutional: She appears well-developed and well-nourished. No distress.  HENT:  Mouth/Throat: Oropharynx is clear and moist. No oropharyngeal exudate.  Eyes: Conjunctivae are normal. Pupils are equal, round, and reactive to light.  Cardiovascular: Normal rate, regular rhythm, normal heart sounds and intact distal pulses.   No murmur heard. Pulmonary/Chest: Effort normal and breath sounds  normal. No respiratory distress. She has no wheezes. She has no rales.  Musculoskeletal: She exhibits no edema.  Skin: Skin is warm and dry. No rash noted.  Psychiatric: She has a normal mood and affect.  Nursing note and vitals reviewed.  Results for orders placed or performed in visit on 05/06/16  CBC with Differential/Platelet  Result Value Ref Range   WBC 5.7 4.0 - 10.5 K/uL   RBC 4.61 3.87 - 5.11  Mil/uL   Hemoglobin 13.5 12.0 - 15.0 g/dL   HCT 40.1 36.0 - 46.0 %   MCV 86.9 78.0 - 100.0 fl   MCHC 33.6 30.0 - 36.0 g/dL   RDW 13.2 11.5 - 15.5 %   Platelets 266.0 150.0 - 400.0 K/uL   Neutrophils Relative % 54.5 43.0 - 77.0 %   Lymphocytes Relative 31.1 12.0 - 46.0 %   Monocytes Relative 10.4 3.0 - 12.0 %   Eosinophils Relative 3.7 0.0 - 5.0 %   Basophils Relative 0.3 0.0 - 3.0 %   Neutro Abs 3.1 1.4 - 7.7 K/uL   Lymphs Abs 1.8 0.7 - 4.0 K/uL   Monocytes Absolute 0.6 0.1 - 1.0 K/uL   Eosinophils Absolute 0.2 0.0 - 0.7 K/uL   Basophils Absolute 0.0 0.0 - 0.1 K/uL  Basic metabolic panel  Result Value Ref Range   Sodium 139 135 - 145 mEq/L   Potassium 4.3 3.5 - 5.1 mEq/L   Chloride 103 96 - 112 mEq/L   CO2 29 19 - 32 mEq/L   Glucose, Bld 93 70 - 99 mg/dL   BUN 26 (H) 6 - 23 mg/dL   Creatinine, Ser 0.84 0.40 - 1.20 mg/dL   Calcium 9.7 8.4 - 10.5 mg/dL   GFR 70.21 >60.00 mL/min    CHEST 2 VIEW COMPARISON: 03/11/2013 FINDINGS: The heart size and mediastinal contours are within normal limits. Both lungs are clear. The visualized skeletal structures are unremarkable. IMPRESSION: No active cardiopulmonary disease Electronically Signed By: Inez Catalina M.D. On: 05/06/2016 12:02  EKG - NSR rate 80, normal LAD with LAFB, normal intervals, no acute ST/T changes, unchanged from EKG 05/2014. Incomplete RBBB previously present as well.     Assessment & Plan:   Problem List Items Addressed This Visit    Abnormal EKG    Chronic LAFB, incomplete RBBB Nonspecific changes.  Patient remains asymptomatic from cardiac standpoint.  Has previously seen and been cleared by cards without further need for testing Rockey Situ).  Anticipate low risk - medical clearance given      Post-operative nausea and vomiting   Pre-op evaluation - Primary    Not high risk procedure. Overall low risk patient. See above for h/o abnormal EKG.  Revised Cardiac Risk Index = 0; no need for further evaluation.  Check EKG, CXR, CBC, BMP.  If stable, will provide medical clearance for this.  Pt agrees with plan.       Relevant Orders   EKG 12-Lead (Completed)   CBC with Differential/Platelet (Completed)   Basic metabolic panel (Completed)   DG Chest 2 View (Completed)   Refusal of blood transfusions as patient is Jehovah's Witness       Follow up plan: No Follow-up on file.  [name], MD

## 2016-05-07 ENCOUNTER — Telehealth: Payer: Self-pay | Admitting: Family Medicine

## 2016-05-07 NOTE — Telephone Encounter (Signed)
preop clearance form filled and in Kim's box. plz send along with office note attn Dr Pryor Ochoa

## 2016-05-07 NOTE — Assessment & Plan Note (Addendum)
Chronic LAFB, incomplete RBBB Nonspecific changes. Patient remains asymptomatic from cardiac standpoint.  Has previously seen and been cleared by cards without further need for testing Kristin Kramer).  Anticipate low risk - medical clearance given

## 2016-05-07 NOTE — Assessment & Plan Note (Deleted)
H/o post op nasusa/vomiting

## 2016-05-07 NOTE — Assessment & Plan Note (Signed)
Not high risk procedure. Overall low risk patient. See above for h/o abnormal EKG.  Revised Cardiac Risk Index = 0; no need for further evaluation.  Check EKG, CXR, CBC, BMP.  If stable, will provide medical clearance for this.  Pt agrees with plan.

## 2016-05-09 NOTE — Telephone Encounter (Signed)
Notes faxed as directed.

## 2016-05-12 ENCOUNTER — Encounter
Admission: RE | Admit: 2016-05-12 | Discharge: 2016-05-12 | Disposition: A | Discharge status: Home / Self Care | Payer: Medicare Other | Source: Ambulatory Visit | Attending: Otolaryngology | Admitting: Otolaryngology

## 2016-05-12 DIAGNOSIS — J3489 Other specified disorders of nose and nasal sinuses: Secondary | ICD-10-CM | POA: Insufficient documentation

## 2016-05-12 DIAGNOSIS — Z01818 Encounter for other preprocedural examination: Secondary | ICD-10-CM | POA: Insufficient documentation

## 2016-05-12 NOTE — Progress Notes (Signed)
Telephone message left for Kristin Kramer at Dr. Darien Ramus office regarding whether Dr. Pryor Ochoa wants to order an antibiotic prior to surgery due to the patient having bilateral total knee replacements.

## 2016-05-12 NOTE — Patient Instructions (Signed)
  Your procedure is scheduled on: Thursday, May 19, 2016 Report to Same Day Surgery 2nd floor medical mall ([place] Entrance-take elevator on left to 2nd floor.  Check in with surgery information desk.) To find out your arrival time please call [phone] between 1PM - 3PM on Wednesday, March 14th  Remember: Instructions that are not followed completely may result in serious medical risk, up to and including death, or upon the discretion of your surgeon and anesthesiologist your surgery may need to be rescheduled.    _x___ 1. Do not eat food or drink liquids after midnight. No gum chewing or hard candies.     __x__ 2. No Alcohol for 24 hours before or after surgery.   __x__3. No Smoking for 24 prior to surgery.   ____  4. Bring all medications with you on the day of surgery if instructed.    __x__ 5. Notify your doctor if there is any change in your medical condition     (cold, fever, infections).     Do not wear jewelry, make-up, hairpins, clips or nail polish.  Do not wear lotions, powders, or perfumes. You may wear deodorant.  Do not shave 48 hours prior to surgery. Men may shave face and neck.  Do not bring valuables to the hospital.    [facility] [place] is not responsible for any belongings or valuables.               Contacts, dentures or bridgework may not be worn into surgery.  Leave your suitcase in the car. After surgery it may be brought to your room.  For patients admitted to the hospital, discharge time is determined by your treatment team.   Patients discharged the day of surgery will not be allowed to drive home.  You will need someone to drive you home and stay with you the night of your procedure.    Please read over the following fact sheets that you were given:   [facility] Preparing for Surgery and or MRSA Information   _x___ Take anti-hypertensive (unless it includes a diuretic), cardiac, seizure, asthma, anti-reflux and psychiatric medicines with a SIP of  water. These include:   1. ZANTAC (TAKE ONE TABLET THE NIGHT BEFORE SURGERY AND ONE TABLET THE MORNING OF SURGERY)  2.  3.  4.  5.  6.  ____Fleets enema or Magnesium Citrate as directed.   ____ Use CHG Soap or sage wipes as directed on instruction sheet   __X__ Use inhalers on the day of surgery and bring to hospital day of surgery  ____ Stop Metformin and Janumet 2 days prior to surgery.    ____ Take 1/2 of usual insulin dose the night before surgery and none on the morning surgery.   _x___ Follow recommendations from Cardiologist, Pulmonologist or PCP regarding stopping Aspirin, Coumadin, Pllavix ,Eliquis, Effient, or Pradaxa, and Pletal.  X____Stop Anti-inflammatories such as Advil, Aleve, Ibuprofen, Motrin, Naproxen, Naprosyn, Goodies powders or aspirin products. OK to take Tylenol and Celebrex.   _x___ Stop supplements until after surgery.  But may continue Vitamin D, Vitamin B, and multivitamin.  STOP BIOTIN, CALCIUM, COENZYME, MAGNESIUM, NIACIN, FISH OIL, PROBIOTIC, TURMERIC   ____ Bring C-Pap to the hospital.

## 2016-05-19 ENCOUNTER — Ambulatory Visit: Payer: Medicare Other | Admitting: Anesthesiology

## 2016-05-19 ENCOUNTER — Encounter: Payer: Self-pay | Admitting: *Deleted

## 2016-05-19 ENCOUNTER — Encounter
Admission: RE | Disposition: A | Discharge status: Home / Self Care | Payer: Self-pay | Source: Ambulatory Visit | Attending: Otolaryngology

## 2016-05-19 ENCOUNTER — Ambulatory Visit
Admission: RE | Admit: 2016-05-19 | Discharge: 2016-05-19 | Disposition: A | Discharge status: Home / Self Care | Payer: Medicare Other | Source: Ambulatory Visit | Attending: Otolaryngology | Admitting: Otolaryngology

## 2016-05-19 DIAGNOSIS — E7800 Pure hypercholesterolemia, unspecified: Secondary | ICD-10-CM | POA: Diagnosis not present

## 2016-05-19 DIAGNOSIS — K219 Gastro-esophageal reflux disease without esophagitis: Secondary | ICD-10-CM | POA: Insufficient documentation

## 2016-05-19 DIAGNOSIS — Z79899 Other long term (current) drug therapy: Secondary | ICD-10-CM | POA: Insufficient documentation

## 2016-05-19 DIAGNOSIS — Z85828 Personal history of other malignant neoplasm of skin: Secondary | ICD-10-CM | POA: Insufficient documentation

## 2016-05-19 DIAGNOSIS — J321 Chronic frontal sinusitis: Secondary | ICD-10-CM | POA: Diagnosis not present

## 2016-05-19 DIAGNOSIS — E785 Hyperlipidemia, unspecified: Secondary | ICD-10-CM | POA: Diagnosis not present

## 2016-05-19 DIAGNOSIS — J322 Chronic ethmoidal sinusitis: Secondary | ICD-10-CM | POA: Diagnosis not present

## 2016-05-19 DIAGNOSIS — F419 Anxiety disorder, unspecified: Secondary | ICD-10-CM | POA: Diagnosis not present

## 2016-05-19 DIAGNOSIS — Z96653 Presence of artificial knee joint, bilateral: Secondary | ICD-10-CM | POA: Insufficient documentation

## 2016-05-19 DIAGNOSIS — Z7951 Long term (current) use of inhaled steroids: Secondary | ICD-10-CM | POA: Insufficient documentation

## 2016-05-19 DIAGNOSIS — F329 Major depressive disorder, single episode, unspecified: Secondary | ICD-10-CM | POA: Diagnosis not present

## 2016-05-19 DIAGNOSIS — J324 Chronic pansinusitis: Secondary | ICD-10-CM | POA: Insufficient documentation

## 2016-05-19 DIAGNOSIS — J329 Chronic sinusitis, unspecified: Secondary | ICD-10-CM | POA: Diagnosis not present

## 2016-05-19 DIAGNOSIS — J320 Chronic maxillary sinusitis: Secondary | ICD-10-CM | POA: Diagnosis not present

## 2016-05-19 DIAGNOSIS — J323 Chronic sphenoidal sinusitis: Secondary | ICD-10-CM | POA: Diagnosis not present

## 2016-05-19 HISTORY — PX: ETHMOIDECTOMY: SHX5197

## 2016-05-19 HISTORY — PX: SINUS ENDO W/FUSION: SHX777

## 2016-05-19 HISTORY — PX: SPHENOIDECTOMY: SHX2421

## 2016-05-19 HISTORY — PX: FRONTAL SINUS EXPLORATION: SHX6591

## 2016-05-19 HISTORY — PX: MAXILLARY ANTROSTOMY: SHX2003

## 2016-05-19 SURGERY — SINUS SURGERY, ENDOSCOPIC, USING COMPUTER-ASSISTED NAVIGATION
Anesthesia: General

## 2016-05-19 MED ORDER — OXYMETAZOLINE HCL 0.05 % NA SOLN
NASAL | Status: AC
  Filled 2016-05-19: qty 15

## 2016-05-19 MED ORDER — FENTANYL CITRATE (PF) 100 MCG/2ML IJ SOLN
INTRAMUSCULAR | Status: AC
  Filled 2016-05-19: qty 2

## 2016-05-19 MED ORDER — HYDROCODONE-ACETAMINOPHEN 5-325 MG PO TABS
1.0000 | ORAL_TABLET | ORAL | Status: DC | PRN
  Administered 2016-05-19: 1 via ORAL

## 2016-05-19 MED ORDER — ONDANSETRON HCL 4 MG PO TABS: 4.0000 mg | ORAL_TABLET | Freq: Three times a day (TID) | ORAL | 0 refills | Status: DC | PRN

## 2016-05-19 MED ORDER — HYDROCODONE-ACETAMINOPHEN 5-325 MG PO TABS: 1.0000 | ORAL_TABLET | ORAL | 0 refills | Status: DC | PRN

## 2016-05-19 MED ORDER — CLINDAMYCIN PHOSPHATE 900 MG/50ML IV SOLN
INTRAVENOUS | Status: AC
  Administered 2016-05-19: 900 mg via INTRAVENOUS
  Filled 2016-05-19: qty 50

## 2016-05-19 MED ORDER — FENTANYL CITRATE (PF) 100 MCG/2ML IJ SOLN
INTRAMUSCULAR | Status: DC | PRN
  Administered 2016-05-19: 100 ug via INTRAVENOUS

## 2016-05-19 MED ORDER — FENTANYL CITRATE (PF) 100 MCG/2ML IJ SOLN
INTRAMUSCULAR | Status: AC
  Administered 2016-05-19: 25 ug via INTRAVENOUS
  Filled 2016-05-19: qty 2

## 2016-05-19 MED ORDER — LIDOCAINE-EPINEPHRINE 1 %-1:100000 IJ SOLN
INTRAMUSCULAR | Status: DC | PRN
  Administered 2016-05-19: 8 mL

## 2016-05-19 MED ORDER — AMOXICILLIN-POT CLAVULANATE 875-125 MG PO TABS: 1.0000 | ORAL_TABLET | Freq: Two times a day (BID) | ORAL | 0 refills | Status: DC

## 2016-05-19 MED ORDER — MIDAZOLAM HCL 2 MG/2ML IJ SOLN
INTRAMUSCULAR | Status: DC | PRN
  Administered 2016-05-19: 2 mg via INTRAVENOUS

## 2016-05-19 MED ORDER — ROCURONIUM BROMIDE 50 MG/5ML IV SOLN
INTRAVENOUS | Status: AC
  Filled 2016-05-19: qty 1

## 2016-05-19 MED ORDER — SUGAMMADEX SODIUM 200 MG/2ML IV SOLN
INTRAVENOUS | Status: DC | PRN
  Administered 2016-05-19: 140 mg via INTRAVENOUS

## 2016-05-19 MED ORDER — DOCUSATE SODIUM 100 MG PO CAPS: 100.0000 mg | ORAL_CAPSULE | Freq: Two times a day (BID) | ORAL | 0 refills | Status: DC

## 2016-05-19 MED ORDER — ONDANSETRON HCL 4 MG/2ML IJ SOLN
INTRAMUSCULAR | Status: DC | PRN
  Administered 2016-05-19: 4 mg via INTRAVENOUS

## 2016-05-19 MED ORDER — MIDAZOLAM HCL 2 MG/2ML IJ SOLN
INTRAMUSCULAR | Status: AC
  Filled 2016-05-19: qty 2

## 2016-05-19 MED ORDER — ONDANSETRON HCL 4 MG/2ML IJ SOLN: 4.0000 mg | Freq: Once | INTRAMUSCULAR | Status: DC | PRN

## 2016-05-19 MED ORDER — ROCURONIUM BROMIDE 100 MG/10ML IV SOLN
INTRAVENOUS | Status: DC | PRN
  Administered 2016-05-19: 40 mg via INTRAVENOUS
  Administered 2016-05-19: 10 mg via INTRAVENOUS

## 2016-05-19 MED ORDER — EPHEDRINE SULFATE 50 MG/ML IJ SOLN
INTRAMUSCULAR | Status: DC | PRN
  Administered 2016-05-19 (×2): 10 mg via INTRAVENOUS

## 2016-05-19 MED ORDER — EPHEDRINE SULFATE 50 MG/ML IJ SOLN
INTRAMUSCULAR | Status: AC
  Filled 2016-05-19: qty 1

## 2016-05-19 MED ORDER — PREDNISONE 10 MG (21) PO TBPK: ORAL_TABLET | ORAL | 0 refills | Status: DC

## 2016-05-19 MED ORDER — BUPIVACAINE HCL (PF) 0.5 % IJ SOLN
INTRAMUSCULAR | Status: AC
  Filled 2016-05-19: qty 30

## 2016-05-19 MED ORDER — HYDROCODONE-ACETAMINOPHEN 5-325 MG PO TABS
ORAL_TABLET | ORAL | Status: AC
  Administered 2016-05-19: 1 via ORAL
  Filled 2016-05-19: qty 1

## 2016-05-19 MED ORDER — SCOPOLAMINE 1 MG/3DAYS TD PT72
1.0000 | MEDICATED_PATCH | Freq: Once | TRANSDERMAL | Status: DC
  Administered 2016-05-19: 1.5 mg via TRANSDERMAL

## 2016-05-19 MED ORDER — LIDOCAINE-EPINEPHRINE 1 %-1:100000 IJ SOLN
INTRAMUSCULAR | Status: AC
  Filled 2016-05-19: qty 1

## 2016-05-19 MED ORDER — FENTANYL CITRATE (PF) 100 MCG/2ML IJ SOLN
25.0000 ug | INTRAMUSCULAR | Status: DC | PRN
  Administered 2016-05-19 (×2): 25 ug via INTRAVENOUS

## 2016-05-19 MED ORDER — OXYMETAZOLINE HCL 0.05 % NA SOLN
NASAL | Status: DC | PRN
  Administered 2016-05-19: 1

## 2016-05-19 MED ORDER — SODIUM CHLORIDE 0.9 % IJ SOLN
INTRAMUSCULAR | Status: AC
  Filled 2016-05-19: qty 10

## 2016-05-19 MED ORDER — SCOPOLAMINE 1 MG/3DAYS TD PT72
MEDICATED_PATCH | TRANSDERMAL | Status: AC
  Administered 2016-05-19: 1.5 mg via TRANSDERMAL
  Filled 2016-05-19: qty 1

## 2016-05-19 MED ORDER — CLINDAMYCIN PHOSPHATE 900 MG/50ML IV SOLN
900.0000 mg | Freq: Once | INTRAVENOUS | Status: AC
  Administered 2016-05-19 (×2): 900 mg via INTRAVENOUS

## 2016-05-19 MED ORDER — PROPOFOL 10 MG/ML IV BOLUS
INTRAVENOUS | Status: DC | PRN
  Administered 2016-05-19: 130 mg via INTRAVENOUS

## 2016-05-19 MED ORDER — LACTATED RINGERS IV SOLN
INTRAVENOUS | Status: DC
  Administered 2016-05-19: 75 mL/h via INTRAVENOUS

## 2016-05-19 SURGICAL SUPPLY — 38 items
BATTERY INSTRU NAVIGATION (MISCELLANEOUS) ×9 IMPLANT
CANISTER SUC SOCK COL 7IN (MISCELLANEOUS) ×3 IMPLANT
CANISTER SUCT 1200ML W/VALVE (MISCELLANEOUS) ×3 IMPLANT
CANISTER SUCT 3000ML (MISCELLANEOUS) ×3 IMPLANT
CNTNR SPEC 2.5X3XGRAD LEK (MISCELLANEOUS) ×4
COAG SUCT 10F 3.5MM HAND CTRL (MISCELLANEOUS) ×3 IMPLANT
CONT SPEC 4OZ STER OR WHT (MISCELLANEOUS) ×2
CONTAINER SPEC 2.5X3XGRAD LEK (MISCELLANEOUS) ×4 IMPLANT
DRESSING NASL FOAM PST OP SINU (MISCELLANEOUS) ×4 IMPLANT
DRSG NASAL 4CM NASOPORE (MISCELLANEOUS) IMPLANT
DRSG NASAL FOAM POST OP SINU (MISCELLANEOUS) ×6
ELECT REM PT RETURN 9FT ADLT (ELECTROSURGICAL) ×3
ELECTRODE REM PT RTRN 9FT ADLT (ELECTROSURGICAL) ×2 IMPLANT
GAUZE PACK 2X3YD (MISCELLANEOUS) ×3 IMPLANT
GLOVE BIO SURGEON STRL SZ7.5 (GLOVE) ×6 IMPLANT
GOWN STRL REUS W/ TWL LRG LVL3 (GOWN DISPOSABLE) ×4 IMPLANT
GOWN STRL REUS W/TWL LRG LVL3 (GOWN DISPOSABLE) ×2
IV NS 1000ML (IV SOLUTION) ×1
IV NS 1000ML BAXH (IV SOLUTION) ×2 IMPLANT
KIT RM TURNOVER STRD PROC AR (KITS) ×3 IMPLANT
NAVIGATION MASK REG  ST (MISCELLANEOUS) ×3 IMPLANT
NS IRRIG 500ML POUR BTL (IV SOLUTION) ×3 IMPLANT
PACK HEAD/NECK (MISCELLANEOUS) ×3 IMPLANT
PACKING NASAL EPIS 4X2.4 XEROG (MISCELLANEOUS) ×6 IMPLANT
PATTIES SURGICAL .5 X3 (DISPOSABLE) ×6 IMPLANT
SHAVER DIEGO BLD STD TYPE A (BLADE) ×3 IMPLANT
SOL ANTI-FOG 6CC FOG-OUT (MISCELLANEOUS) ×2 IMPLANT
SOL FOG-OUT ANTI-FOG 6CC (MISCELLANEOUS) ×1
SPLINT NASAL REUTER (MISCELLANEOUS) IMPLANT
SPLINT NASAL REUTER .5MM (MISCELLANEOUS) IMPLANT
SUT CHROMIC 4 0 RB 1X27 (SUTURE) ×3 IMPLANT
SUT ETHILON 3 0 PS 1 (SUTURE) IMPLANT
SWAB CULTURE AMIES ANAERIB BLU (MISCELLANEOUS) IMPLANT
SYR 30ML LL (SYRINGE) ×3 IMPLANT
TRAP SPECIMEN MUCOUS 40CC (MISCELLANEOUS) IMPLANT
TUBING CONNECTING 10 (TUBING) ×3 IMPLANT
TUBING DECLOG MULTIDEBRIDER (TUBING) ×3 IMPLANT
WATER STERILE IRR 1000ML POUR (IV SOLUTION) ×3 IMPLANT

## 2016-05-19 NOTE — Discharge Instructions (Signed)

## 2016-05-19 NOTE — Op Note (Signed)
..05/19/2016  10:39 AM    Kristin Kramer  [id number]   Pre-Op Dx:  Chronic Rhinosinusitis refractory to medical treatment  Post-op Dx: Same  Proc:   1)  Image Guided Sinus Surgery,  2)  Bilateral Maxillary Antrostomy  3)  Bilateral Frontal Sinusotomy  4)  Bilateral total ethmoidectomy  5)  Bilateral Sphenoidotomy   Surg:  Kristin Kramer  Anes:  General  EBL:  8ml  Comp:  None  Findings: Large mucocele left maxillary sinus, polypoid degeneration of ethmoid mucosa  Procedure: After the patient was identified in holding and the benefits of the procedure were reviewed as well as the consent and risks.  The patient was taken to the operating room and with the patient in a comfortable supine position,  general orotracheal anesthesia was induced without difficulty.  A proper time-out was performed.  The Stryker image guidance system was set up and calibrated in the normal fashion with an acceptable error of 82mm.       The patient next received preoperative Afrin spray for topical decongestion and vasoconstriction and 1% Xylocaine with 1:100,000 epinephrine, 8 cc's, was infiltrated into the inferior turbinates, septum, and anterior middle turbinates bilaterally.  Several minutes were allowed for this to take effect.  Cottoniod pledgets soaked in Afrin were placed into both nasal cavities and left while the patient was prepped and draped in the standard fashion.  The materials were removed from the nose and observed to be intact and correct in number.  The nose was next inspected with a zero degree endoscope and the middle turbinates were medialized and afrin soaked pledgets were placed lateral to the turbinates for approximately one minute.  At this time, attention was directed to the patient's left front sinus.  The uncinate process was infractured with a maxillary ostia seeker.  The Acclarent balloon sinuplasty device was next placed behind the uncinate process and the guide wire was  threaded into the frontal sinus with proper transillumination.  The sinus tract was then dilated 3X for a patent frontal sinus.  At this time a currette and Giraffe probe was used to remove the fractured ager nasi cells for a widely patient frontal sinus outflow tract.  This was repeated in an identical fashion on the patient's right side for a widely patent frontal sinus outflow tract bilaterally.  This was evaluated with 0 degree endoscope and fragements of residual ager nasi cells were removed with 45 degree up-biting forceps.  At this time attention was directed to the patient's maxillary sinuses.  On the left, a ball tipped probe was placed through the natural ostia and this was used to create a larger opening.  Using a Diego microdebrider, the maxillary antrostomy was enlarged for a widely patent maxillary antrostomy.  This was repeated in a simlar fashion on the patient's right side.  Hemostasis was performed with topical Afrin soaked pledgets.  Visualization with a zero degree endoscope was used to examine the bilateral maxillary antrostomies which were noted to be widely patent and in continuity with the natural os bilaterally.  Next attention was directed to the patient's ethmoid sinuses.  The left ethmoid bulla was entered with a straight image guidance suction.  The ethmoid cells were opened from a medial and inferior position superior and laterally until the vertical lamella was encountered.  This was opened and the posterior ethmoid was opened in a similar fashion.  All fragments of bone and mucosa were removed with straight biting forceps.  The skull base was  identified and trauma was avoided.  Image guidance was used throughout to ensure all diseased air cells were opened.  This was repeated on the patient's right side in a similar fashion with all ethmoid air cells opened bilaterally.  Attention at this time was directed to the patient's sphenoid sinuses.  On the patient's left side, the  middle turbinate was lateralized and the superior turbinate was identified.  The sphenoid os was visualized and the op was sequentially dilated with an acclarent balloon device and then elarged with 45 degree biting forceps.  This was repeated on the patient's opposite side again with creation of a large sphenoid ostia bilaterally.  At this time with all sinuses opened, the patient's nasal cavity was examinated and copiously irrigated with sterile saline.  Meticulous hemostasis was continued and all sinuses were examined and noted to be widely patent.    Next stamberger sinufoam was placed along the maxillary antrostomies and posterior ethmoids.  Xerogel was placed lateral to the middle turbinate bilaterally as well and then inflated with sterile saline.  Stamberger was placed along the anterior Xerogel as well.  Care of the patient at this time was transferred to anesthesia and was extubated and taken to PACU in good condition.   Dispo:   PACU to home  Plan: Ice, elevation, narcotic analgesia and prophylactic antibiotics.  We will reevaluate the patient in the office in 7 days.  Return to work in 7-10 days, strenuous activities in two weeks.   Tamatha Gadbois 05/19/2016 10:39 AM

## 2016-05-19 NOTE — Transfer of Care (Signed)
Immediate Anesthesia Transfer of Care Note  Patient: Kristin Kramer  Procedure(s) Performed: Procedure(s): ENDOSCOPIC SINUS SURGERY WITH NAVIGATION (N/A) MAXILLARY ANTROSTOMY (Bilateral) ETHMOIDECTOMY (Bilateral) SPHENOIDECTOMY (Bilateral) FRONTAL SINUS EXPLORATION (Bilateral)  Patient Location: PACU  Anesthesia Type:General  Level of Consciousness: awake, alert  and oriented  Airway & Oxygen Therapy: Patient Spontanous Breathing and aerosol face mask  Post-op Assessment: Report given to RN and Post -op Vital signs reviewed and stable  Post vital signs: Reviewed and stable  Last Vitals:  Vitals:   05/19/16 0750 05/19/16 1044  BP: 131/69 128/70  Pulse: 73 93  Resp: 16 12  Temp:  36.3 C    Last Pain:  Vitals:   05/19/16 0750  TempSrc: Tympanic      Patients Stated Pain Goal: 0 (30/05/11 0211)  Complications: No apparent anesthesia complications

## 2016-05-19 NOTE — Anesthesia Procedure Notes (Signed)
Procedure Name: Intubation Date/Time: 05/19/2016 9:03 AM Performed by: Jonna Clark Pre-anesthesia Checklist: Patient identified, Patient being monitored, Timeout performed, Emergency Drugs available and Suction available Patient Re-evaluated:Patient Re-evaluated prior to inductionOxygen Delivery Method: Circle system utilized Preoxygenation: Pre-oxygenation with 100% oxygen Intubation Type: IV induction Ventilation: Mask ventilation without difficulty Laryngoscope Size: Glidescope and 3 Grade View: Grade III Tube type: Oral Rae Tube size: 7.0 mm Number of attempts: 3 Airway Equipment and Method: Stylet Placement Confirmation: ETT inserted through vocal cords under direct vision,  positive ETCO2 and breath sounds checked- equal and bilateral Secured at: 21 cm Tube secured with: Tape Dental Injury: Teeth and Oropharynx as per pre-operative assessment  Difficulty Due To: Difficulty was anticipated and Difficult Airway- due to anterior larynx Future Recommendations: Recommend- induction with short-acting agent, and alternative techniques readily available Comments: Pt had very narrow palate and a small mouth opening. Easy mask. VSS during entire induction/intubation

## 2016-05-19 NOTE — Anesthesia Postprocedure Evaluation (Signed)
Anesthesia Post Note  Patient: Kristin Kramer  Procedure(s) Performed: Procedure(s) (LRB): ENDOSCOPIC SINUS SURGERY WITH NAVIGATION (N/A) MAXILLARY ANTROSTOMY (Bilateral) ETHMOIDECTOMY (Bilateral) SPHENOIDECTOMY (Bilateral) FRONTAL SINUS EXPLORATION (Bilateral)  Patient location during evaluation: PACU Anesthesia Type: General Level of consciousness: awake and alert Pain management: pain level controlled Vital Signs Assessment: post-procedure vital signs reviewed and stable Respiratory status: spontaneous breathing, nonlabored ventilation, respiratory function stable and patient connected to nasal cannula oxygen Cardiovascular status: blood pressure returned to baseline and stable Postop Assessment: no signs of nausea or vomiting Anesthetic complications: no     Last Vitals:  Vitals:   05/19/16 1141 05/19/16 1232  BP: 130/62 (!) 131/56  Pulse: 73 71  Resp: 16 16  Temp: 36.2 C     Last Pain:  Vitals:   05/19/16 1232  TempSrc:   PainSc: 5                  Martha Clan

## 2016-05-19 NOTE — H&P (Signed)
..  History and Physical paper copy reviewed and updated date of procedure and will be scanned into system.  Patient seen and examined.  

## 2016-05-19 NOTE — Anesthesia Preprocedure Evaluation (Addendum)
Anesthesia Evaluation  Patient identified by MRN, date of birth, ID band Patient awake    Reviewed: Allergy & Precautions, H&P , NPO status , Patient's Chart, lab work & pertinent test results  History of Anesthesia Complications (+) PONV and history of anesthetic complications  Airway Mallampati: III       Dental  (+) Teeth Intact, Caps   Pulmonary neg pulmonary ROS,           Cardiovascular Exercise Tolerance: Good negative cardio ROS       Neuro/Psych PSYCHIATRIC DISORDERS (Depression) Anxiety Depression negative neurological ROS     GI/Hepatic Neg liver ROS, GERD  Medicated and Controlled,  Endo/Other  negative endocrine ROS  Renal/GU negative Renal ROS  negative genitourinary   Musculoskeletal   Abdominal   Peds  Hematology  (+) REFUSES BLOOD PRODUCTS, JEHOVAH'S WITNESS  Anesthesia Other Findings Past Medical History: 10.2014: Abdominal pain, other specified site     Comment: [facility] ER - mesenteric adenitis vs sclerosing               mesenteritis vs nonspecific lymphadenitis No date: Abnormal ECG     Comment: a. left axis deviation;  b. 12/2011 Echo: EF               55-60%, no rwma, pasp 39mmHg. No date: Arthritis No date: Depression     Comment: prior on lexapro, celexa, wellbutrin.  high               cymbalta doses cause tremors No date: H/O seasonal allergies No date: History of pneumonia No date: History of UTI     Comment: 12/2011 - Treatment began preoperatively with               CIPRO 500mg  BID No date: HLD (hyperlipidemia)     Comment: statin intolerance 2013, 2014: Knee osteoarthritis     Comment: bilateral s/p B TKR Noemi Chapel) No date: Knee osteoarthritis No date: Migraines No date: Osteopenia No date: Post-operative nausea and vomiting No date: Refusal of blood transfusions as patient is Je* No date: Seasonal allergies     Comment: cats, dust, mold, roaches 07/2013: Squamous cell  skin cancer     Comment: R anterior leg 2015 multiple, 2016, 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 Isac Sarna), R tibia               - then recurrent No date: Vitamin D deficiency   Reproductive/Obstetrics negative OB ROS                            Anesthesia Physical  Anesthesia Plan  ASA: II  Anesthesia Plan: General   Post-op Pain Management:    Induction: Intravenous  Airway Management Planned: Oral ETT  Additional Equipment:   Intra-op Plan:   Post-operative Plan: Extubation in OR  Informed Consent: I have reviewed the patients History and Physical, chart, labs and discussed the procedure including the risks, benefits and alternatives for the proposed anesthesia with the patient or authorized representative who has indicated his/her understanding and acceptance.   Dental advisory given  Plan Discussed with: CRNA and Anesthesiologist  Anesthesia Plan Comments:         Anesthesia Quick Evaluation

## 2016-05-19 NOTE — Anesthesia Post-op Follow-up Note (Cosign Needed)
Anesthesia QCDR form completed.        

## 2016-05-20 ENCOUNTER — Encounter: Payer: Self-pay | Admitting: Otolaryngology

## 2016-05-20 LAB — SURGICAL PATHOLOGY

## 2016-05-26 DIAGNOSIS — J324 Chronic pansinusitis: Secondary | ICD-10-CM | POA: Diagnosis not present

## 2016-05-30 DIAGNOSIS — H26493 Other secondary cataract, bilateral: Secondary | ICD-10-CM | POA: Diagnosis not present

## 2016-05-30 DIAGNOSIS — H26492 Other secondary cataract, left eye: Secondary | ICD-10-CM | POA: Diagnosis not present

## 2016-05-30 DIAGNOSIS — Z961 Presence of intraocular lens: Secondary | ICD-10-CM | POA: Diagnosis not present

## 2016-06-09 DIAGNOSIS — J324 Chronic pansinusitis: Secondary | ICD-10-CM | POA: Diagnosis not present

## 2016-07-11 DIAGNOSIS — J322 Chronic ethmoidal sinusitis: Secondary | ICD-10-CM | POA: Diagnosis not present

## 2016-07-11 DIAGNOSIS — J324 Chronic pansinusitis: Secondary | ICD-10-CM | POA: Diagnosis not present

## 2016-07-13 DIAGNOSIS — C44729 Squamous cell carcinoma of skin of left lower limb, including hip: Secondary | ICD-10-CM | POA: Diagnosis not present

## 2016-07-13 DIAGNOSIS — C44722 Squamous cell carcinoma of skin of right lower limb, including hip: Secondary | ICD-10-CM | POA: Diagnosis not present

## 2016-07-13 DIAGNOSIS — D485 Neoplasm of uncertain behavior of skin: Secondary | ICD-10-CM | POA: Diagnosis not present

## 2016-07-13 DIAGNOSIS — Z85828 Personal history of other malignant neoplasm of skin: Secondary | ICD-10-CM | POA: Diagnosis not present

## 2016-07-13 HISTORY — PX: SQUAMOUS CELL CARCINOMA EXCISION: SHX2433

## 2016-07-15 ENCOUNTER — Emergency Department: Payer: Medicare Other

## 2016-07-15 ENCOUNTER — Encounter: Payer: Self-pay | Admitting: Emergency Medicine

## 2016-07-15 ENCOUNTER — Emergency Department
Admission: EM | Admit: 2016-07-15 | Discharge: 2016-07-15 | Disposition: A | Discharge status: Home / Self Care | Payer: Medicare Other | Attending: Emergency Medicine | Admitting: Emergency Medicine

## 2016-07-15 DIAGNOSIS — Z79899 Other long term (current) drug therapy: Secondary | ICD-10-CM | POA: Diagnosis not present

## 2016-07-15 DIAGNOSIS — R51 Headache: Secondary | ICD-10-CM | POA: Diagnosis not present

## 2016-07-15 DIAGNOSIS — S80819A Abrasion, unspecified lower leg, initial encounter: Secondary | ICD-10-CM | POA: Insufficient documentation

## 2016-07-15 DIAGNOSIS — Z96653 Presence of artificial knee joint, bilateral: Secondary | ICD-10-CM | POA: Insufficient documentation

## 2016-07-15 DIAGNOSIS — S161XXA Strain of muscle, fascia and tendon at neck level, initial encounter: Secondary | ICD-10-CM | POA: Diagnosis not present

## 2016-07-15 DIAGNOSIS — T07XXXA Unspecified multiple injuries, initial encounter: Secondary | ICD-10-CM

## 2016-07-15 DIAGNOSIS — S0083XA Contusion of other part of head, initial encounter: Secondary | ICD-10-CM | POA: Insufficient documentation

## 2016-07-15 DIAGNOSIS — M7989 Other specified soft tissue disorders: Secondary | ICD-10-CM | POA: Diagnosis not present

## 2016-07-15 DIAGNOSIS — Y9301 Activity, walking, marching and hiking: Secondary | ICD-10-CM | POA: Diagnosis not present

## 2016-07-15 DIAGNOSIS — M79605 Pain in left leg: Secondary | ICD-10-CM | POA: Diagnosis not present

## 2016-07-15 DIAGNOSIS — S8011XA Contusion of right lower leg, initial encounter: Secondary | ICD-10-CM | POA: Diagnosis not present

## 2016-07-15 DIAGNOSIS — Y92512 Supermarket, store or market as the place of occurrence of the external cause: Secondary | ICD-10-CM | POA: Insufficient documentation

## 2016-07-15 DIAGNOSIS — Y998 Other external cause status: Secondary | ICD-10-CM | POA: Insufficient documentation

## 2016-07-15 DIAGNOSIS — S0990XA Unspecified injury of head, initial encounter: Secondary | ICD-10-CM | POA: Diagnosis not present

## 2016-07-15 DIAGNOSIS — S199XXA Unspecified injury of neck, initial encounter: Secondary | ICD-10-CM | POA: Diagnosis not present

## 2016-07-15 DIAGNOSIS — W1809XA Striking against other object with subsequent fall, initial encounter: Secondary | ICD-10-CM | POA: Insufficient documentation

## 2016-07-15 DIAGNOSIS — S8012XA Contusion of left lower leg, initial encounter: Secondary | ICD-10-CM | POA: Diagnosis not present

## 2016-07-15 DIAGNOSIS — S0081XA Abrasion of other part of head, initial encounter: Secondary | ICD-10-CM | POA: Diagnosis not present

## 2016-07-15 DIAGNOSIS — S134XXA Sprain of ligaments of cervical spine, initial encounter: Secondary | ICD-10-CM | POA: Insufficient documentation

## 2016-07-15 DIAGNOSIS — R52 Pain, unspecified: Secondary | ICD-10-CM

## 2016-07-15 MED ORDER — HYDROCODONE-ACETAMINOPHEN 5-325 MG PO TABS: 1.0000 | ORAL_TABLET | ORAL | 0 refills | Status: DC | PRN

## 2016-07-15 MED ORDER — OXYCODONE-ACETAMINOPHEN 5-325 MG PO TABS
1.0000 | ORAL_TABLET | Freq: Once | ORAL | Status: AC
  Administered 2016-07-15: 1 via ORAL
  Filled 2016-07-15: qty 1

## 2016-07-15 NOTE — ED Triage Notes (Signed)
Pt reports was looking at flowers at a garden store and tripped over a tractor. Pt presents with swelling to forehead, abrasions to lower legs bilaterally and controlled nosebleed. Pt denies dizziness prior to fall. Denies LOC. Denies being on anticoagulants.

## 2016-07-15 NOTE — Discharge Instructions (Signed)
Follow-up with your primary care doctor if any continued problems. Wash abrasions daily with mild soap and water. Watch for signs of infection such as redness or pus. Norco as needed for severe pain. Do not take this medication on an empty stomach.  This medication could cause drowsiness increase your risk for falling.

## 2016-07-15 NOTE — ED Provider Notes (Signed)
[facility] Emergency Department Provider Note  ____________________________________________   First MD Initiated Contact with Patient 07/15/16 1313     (approximate)  I have reviewed the triage vital signs and the nursing notes.   HISTORY  Chief Complaint Fall    HPI Kristin Kramer is a 76 y.o. female is here after falling at a garden store. Patient states that she is living at flowers at the store and did not see the trailer hitch and tripped. Patient states that she fell face forward. She denies any loss of consciousness. She did have a nosebleed on the right side that was controlled easily. Patient complains of cervical pain. Patient also complains of swelling and abrasions to her forehead along with abrasions to her lower legs. Patient denies being on any anticoagulants. She denies any visual changes, nausea, vomiting. The patient rates her pain as a 10 over 10.   Past Medical History:  Diagnosis Date  . Abdominal pain, other specified site 10.2014   [facility] ER - mesenteric adenitis vs sclerosing mesenteritis vs nonspecific lymphadenitis  . Abnormal ECG    a. left axis deviation;  b. 12/2011 Echo: EF 55-60%, no rwma, pasp 75mmHg.  . Arthritis   . Depression    prior on lexapro, celexa, wellbutrin.  high cymbalta doses cause tremors  . H/O seasonal allergies   . History of pneumonia   . History of UTI    12/2011 - Treatment began preoperatively with CIPRO 500mg  BID  . HLD (hyperlipidemia)    statin intolerance  . Knee osteoarthritis 2013, 2014   bilateral s/p B TKR Noemi Chapel)  . Knee osteoarthritis   . Migraines   . Osteopenia   . Post-operative nausea and vomiting   . Refusal of blood transfusions as patient is Jehovah's Witness   . Seasonal allergies    cats, dust, mold, roaches  . 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 Isac Sarna), R tibia - then  recurrent  . Vitamin D deficiency     Patient Active Problem List   Diagnosis Date Noted  . Pre-op evaluation 05/06/2016  . Advanced care planning/counseling discussion 07/31/2014  . Herpes zoster 02/19/2014  . Allergic rhinitis 04/06/2013  . 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  . Post-operative nausea and vomiting   . Refusal of blood transfusions as patient is Jehovah's Witness   . Medicare annual wellness visit, subsequent 01/14/2013  . Hot flashes 01/14/2013  . Transaminitis 01/03/2013  . Abdominal pain 01/03/2013  . Osteopenia   . HLD (hyperlipidemia)   . Vitamin D deficiency   . Anxiety 01/05/2012  . Seasonal allergies   . Squamous cell skin cancer   . MDD (major depressive disorder), recurrent episode, moderate ([place])   . Migraines   . Abnormal EKG   . Arthritis     Past Surgical History:  Procedure Laterality Date  . BREAST ENHANCEMENT SURGERY    . BUNIONECTOMY  2013   R and L foot  . CATARACT EXTRACTION  2013   R, pending L  . COLONOSCOPY  2007   no records received  . COLONOSCOPY  08/2014   tubular adenoma, rpt 5 yrs [facility] Plan)  . COSMETIC SURGERY  2002   face lift  . dexa  06/2008   T -1.4 spine and hip  . ETHMOIDECTOMY Bilateral 05/19/2016   Procedure: ETHMOIDECTOMY;  Surgeon: Carloyn Manner, MD;  Location: [facility] ORS;  Service: ENT;  Laterality: Bilateral;  . EYE SURGERY     bilateral cataract surgery  . FRONTAL SINUS EXPLORATION Bilateral 05/19/2016   Procedure: FRONTAL SINUS EXPLORATION;  Surgeon: Carloyn Manner, MD;  Location: [facility] ORS;  Service: ENT;  Laterality: Bilateral;  . JOINT REPLACEMENT Bilateral 2015   knees  . MAXILLARY ANTROSTOMY Bilateral 05/19/2016   Procedure: MAXILLARY ANTROSTOMY;  Surgeon: Carloyn Manner, MD;  Location: [facility] ORS;  Service: ENT;  Laterality: Bilateral;  . MOHS SURGERY  2011   R leg  . PARTIAL HYSTERECTOMY  1980   fibroids, ovaries remained  . SINUS ENDO W/FUSION N/A 05/19/2016   Procedure: ENDOSCOPIC  SINUS SURGERY WITH NAVIGATION;  Surgeon: Carloyn Manner, MD;  Location: [facility] ORS;  Service: ENT;  Laterality: N/A;  . SPHENOIDECTOMY Bilateral 05/19/2016   Procedure: Coralee Pesa;  Surgeon: Carloyn Manner, MD;  Location: [facility] ORS;  Service: ENT;  Laterality: Bilateral;  . TONSILLECTOMY AND ADENOIDECTOMY  1945  . TOTAL KNEE ARTHROPLASTY  01/02/2012   Right - Surgeon: Lorn Junes, MD  . TOTAL KNEE ARTHROPLASTY Left 03/18/2013   Surgeon: Lorn Junes, MD    Prior to Admission medications   Medication Sig Start Date End Date Taking? Authorizing Provider  acetaminophen (TYLENOL) 325 MG tablet Take 650 mg by mouth every 6 (six) hours as needed. For pain    [provider]  azelastine (ASTELIN) 0.1 % nasal spray Place 1-2 sprays into both nostrils every 12 (twelve) hours as needed. For nasal drainage 03/03/16   [provider]  Biotin (BIOTIN 5000) 5 MG CAPS Take 5 mg by mouth daily.     [provider]  calcium carbonate (OS-CAL) 600 MG TABS tablet Take 1 tablet (600 mg total) by mouth daily with breakfast. 08/04/15   [name], MD  Cholecalciferol (VITAMIN D3) 5000 units CAPS Take 1 capsule (5,000 Units total) by mouth daily. 08/08/15   [name], MD  Coenzyme Q10 (COQ-10 PO) Take 900 mg by mouth daily.     [provider]  docusate sodium (COLACE) 100 MG capsule Take 1 capsule (100 mg total) by mouth 2 (two) times daily. 05/19/16   Vaught, Jeannie Fend, MD  fluticasone (FLONASE) 50 MCG/ACT nasal spray Place 1-2 sprays into both nostrils daily as needed. For allergies. 04/01/16   [provider]  HYDROcodone-acetaminophen (NORCO/VICODIN) 5-325 MG tablet Take 1 tablet by mouth every 4 (four) hours as needed for moderate pain. 07/15/16   Johnn Hai, PA-C  magnesium gluconate (MAGONATE) 500 MG tablet Take 500 mg by mouth daily.    [provider]  metroNIDAZOLE (METROCREAM) 0.75 % cream Apply 1 application topically 2 (two)  times daily. Applied daily to patient face 04/08/16   [provider]  niacinamide 500 MG tablet Take 500 mg by mouth daily.    [provider]  Omega-3 Fatty Acids (FISH OIL) 1000 MG CAPS Take 1,000 mg by mouth daily.     [provider]  ondansetron (ZOFRAN) 4 MG tablet Take 1 tablet (4 mg total) by mouth every 8 (eight) hours as needed for nausea or vomiting. 05/19/16   Carloyn Manner, MD  PROAIR HFA 108 [phone] Base) MCG/ACT inhaler Inhale 1-2 puffs into the lungs every 6 (six) hours as needed for wheezing or shortness of breath. 04/22/16   [provider]  Probiotic Product (PROBIOTIC PO) Take 1 capsule by mouth daily.     [provider]  ranitidine (ZANTAC) 150 MG tablet Take 150  mg by mouth 2 (two) times daily as needed. For acid reflux/indigestion/GERD 03/03/16   [provider]  sertraline (ZOLOFT) 25 MG tablet Alternate 1 tablet and 1.5 tablets every other day 03/16/16   [name], MD  TURMERIC CURCUMIN PO Take 900 mg by mouth daily.     [provider]    Allergies Lipitor [atorvastatin] and Pravastatin  Family History  Problem Relation Age of Onset  . Cancer Mother        lung, smoker  . Cancer Father        lung, smoker  . Cancer Maternal Grandmother 68       leukemia  . Coronary artery disease Paternal Grandmother 68       sudden cardiac death  . Diabetes Neg Hx   . Stroke Neg Hx     Social History Social History  Substance Use Topics  . Smoking status: Never Smoker  . Smokeless tobacco: Never Used  . Alcohol use 0.0 oz/week     Comment: occasional    Review of Systems Constitutional: No fever/chills Eyes: No visual changes. ENT: Positive for nosebleed resolved. Cardiovascular: Denies chest pain. Respiratory: Denies shortness of breath. Gastrointestinal:   No nausea, no vomiting.   Musculoskeletal: Positive for left lower leg pain. Positive for facial pain and neck pain. Skin: Positive for  multiple abrasions to lower extremities and soft tissue swelling face. Neurological: Negative for headaches, focal weakness or numbness.   ____________________________________________   PHYSICAL EXAM:  VITAL SIGNS: ED Triage Vitals  Enc Vitals Group     BP 07/15/16 1251 (!) 158/93     Pulse Rate 07/15/16 1251 81     Resp --      Temp 07/15/16 1251 97.8 F (36.6 C)     Temp Source 07/15/16 1251 Oral     SpO2 07/15/16 1251 95 %     Weight 07/15/16 1252 151 lb (68.5 kg)     Height 07/15/16 1252 5\' 5"  (1.651 m)     Head Circumference --      Peak Flow --      Pain Score 07/15/16 1251 10     Pain Loc --      Pain Edu? --      Excl. in [place]? --     Constitutional: Alert and oriented. Well appearing and in no acute distress. Eyes: Conjunctivae are normal. PERRL. EOMI. Head: Atraumatic. Nose: Right nares shows evidence of a resolved nosebleed. Nasal area is slightly edematous at the bridge and tender. No gross deformity is noted. Mouth/Throat: Mucous membranes are moist.  Oropharynx non-erythematous. Nontender mandible to palpation. Neck: No stridor.  Positive generalized tenderness cervical spine posteriorly. Patient was placed and a cervical collar. Cardiovascular: Normal rate, regular rhythm. Grossly normal heart sounds.  Good peripheral circulation. Respiratory: Normal respiratory effort.  No retractions. Lungs CTAB. Gastrointestinal: Soft and nontender. No distention.  Musculoskeletal: Examination of the left lower extremity anterior aspect there is an abrasion without active bleeding at this time. Anterior tibia is tender to palpation. Range of motion of the knee and ankle is without restriction. No gross deformity of either joint is noted and no effusion. Palpation of the lumbar spine is negative for tenderness. Patient is able to move upper extremities without any difficulty. Patient has good grip strength bilaterally. Neurologic:  Normal speech and language. No gross focal  neurologic deficits are appreciated.  Skin:  Skin is warm, dry and intact. There is small superficial abrasion with ecchymosis to the right  medial eyebrow. No active bleeding noted. Moderate tenderness to palpation. Bilateral anterior tibialis distally currently has Band-Aids from dermatology procedure yesterday. There is one area on the left lower tib-fib anteriorly without foreign body or active bleeding. Area is tender to touch. There is some soft tissue swelling present. Psychiatric: Mood and affect are normal. Speech and behavior are normal.  ____________________________________________   LABS (all labs ordered are listed, but only abnormal results are displayed)  Labs Reviewed - No data to display  RADIOLOGY  Left tib-fib per radiologist is negative for fracture. I, Johnn Hai, personally viewed and evaluated these images (plain radiographs) as part of my medical decision making, as well as reviewing the written report by the radiologist.  CT cervical spine, head without contrast and maxillofacial CT without contrast per radiologist is negative for acute injury. ____________________________________________   PROCEDURES  Procedure(s) performed: None  Procedures  Critical Care performed: No  ____________________________________________   INITIAL IMPRESSION / ASSESSMENT AND PLAN / ED COURSE  Pertinent labs & imaging results that were available during my care of the patient were reviewed by me and considered in my medical decision making (see chart for details).  Patient and family were made aware that she does not have a fracture. Patient is encouraged to use ice packs to her face to reduce swelling. She is aware that she may have a black eye this weekend. She is to continue cool compresses to her legs if needed for pain or swelling. She is to clean abrasions with mild soap and water and watch for any signs of infection. Patient was given Percocet while in the emergency  room since she had not had any food to eat for several hours. She was discharged with a prescription for Norco one every 4 hours if needed for pain. Patient is to follow-up with her PCP if any continued problems.      ____________________________________________   FINAL CLINICAL IMPRESSION(S) / ED DIAGNOSES  Final diagnoses:  Pain  Contusion of face, initial encounter  Contusion of left lower extremity, initial encounter  Abrasions of multiple sites  Contusion of right lower extremity, initial encounter  Cervical strain, acute, initial encounter      NEW MEDICATIONS STARTED DURING THIS VISIT:  Discharge Medication List as of 07/15/2016  2:43 PM       Note:  This document was prepared using Dragon voice recognition software and may include unintentional dictation errors.    Johnn Hai, PA-C 07/15/16 1535    Delman Kitten, MD 07/16/16 1322

## 2016-07-19 ENCOUNTER — Encounter: Payer: Self-pay | Admitting: Family Medicine

## 2016-07-27 ENCOUNTER — Other Ambulatory Visit: Payer: Self-pay | Admitting: Family Medicine

## 2016-07-27 DIAGNOSIS — E785 Hyperlipidemia, unspecified: Secondary | ICD-10-CM

## 2016-07-27 DIAGNOSIS — M8580 Other specified disorders of bone density and structure, unspecified site: Secondary | ICD-10-CM

## 2016-07-29 ENCOUNTER — Other Ambulatory Visit: Payer: Medicare Other

## 2016-08-04 ENCOUNTER — Other Ambulatory Visit (INDEPENDENT_AMBULATORY_CARE_PROVIDER_SITE_OTHER): Payer: Medicare Other

## 2016-08-04 ENCOUNTER — Ambulatory Visit: Payer: Medicare Other

## 2016-08-04 DIAGNOSIS — M8580 Other specified disorders of bone density and structure, unspecified site: Secondary | ICD-10-CM

## 2016-08-04 DIAGNOSIS — E785 Hyperlipidemia, unspecified: Secondary | ICD-10-CM

## 2016-08-04 LAB — LIPID PANEL
CHOLESTEROL: 256 mg/dL — AB (ref 0–200)
HDL: 59.7 mg/dL (ref >39.00)
LDL Cholesterol: 166 mg/dL — ABNORMAL HIGH (ref 0–99)
NONHDL: 195.85
Total CHOL/HDL Ratio: 4
Triglycerides: 150 mg/dL — ABNORMAL HIGH (ref 0.0–149.0)
VLDL: 30 mg/dL (ref 0.0–40.0)

## 2016-08-04 LAB — TSH: TSH: 3.41 u[IU]/mL (ref 0.35–4.50)

## 2016-08-04 LAB — T4, FREE: FREE T4: 0.75 ng/dL (ref 0.60–1.60)

## 2016-08-05 ENCOUNTER — Ambulatory Visit (INDEPENDENT_AMBULATORY_CARE_PROVIDER_SITE_OTHER): Payer: Medicare Other

## 2016-08-05 ENCOUNTER — Encounter: Payer: Self-pay | Admitting: Family Medicine

## 2016-08-05 ENCOUNTER — Ambulatory Visit (INDEPENDENT_AMBULATORY_CARE_PROVIDER_SITE_OTHER): Payer: Medicare Other | Admitting: Family Medicine

## 2016-08-05 VITALS — BP 118/76 | HR 85 | Temp 97.9°F | Ht 64.75 in | Wt 154.8 lb

## 2016-08-05 DIAGNOSIS — E2839 Other primary ovarian failure: Secondary | ICD-10-CM

## 2016-08-05 DIAGNOSIS — Z7189 Other specified counseling: Secondary | ICD-10-CM | POA: Diagnosis not present

## 2016-08-05 DIAGNOSIS — M5090 Cervical disc disorder, unspecified, unspecified cervical region: Secondary | ICD-10-CM | POA: Insufficient documentation

## 2016-08-05 DIAGNOSIS — M8580 Other specified disorders of bone density and structure, unspecified site: Secondary | ICD-10-CM

## 2016-08-05 DIAGNOSIS — F331 Major depressive disorder, recurrent, moderate: Secondary | ICD-10-CM

## 2016-08-05 DIAGNOSIS — E785 Hyperlipidemia, unspecified: Secondary | ICD-10-CM | POA: Diagnosis not present

## 2016-08-05 DIAGNOSIS — Z0000 Encounter for general adult medical examination without abnormal findings: Secondary | ICD-10-CM | POA: Diagnosis not present

## 2016-08-05 DIAGNOSIS — R413 Other amnesia: Secondary | ICD-10-CM

## 2016-08-05 HISTORY — DX: Other specified disorders of bone density and structure, unspecified site: M85.80

## 2016-08-05 HISTORY — DX: Cervical disc disorder, unspecified, unspecified cervical region: M50.90

## 2016-08-05 NOTE — Progress Notes (Signed)
PCP notes:   Health maintenance:  No gaps identified.   Bone density - pt requested referral per chiropractor. PCP verbally approved. Order generated awaiting PCP's signature.  Abnormal screenings:   Hearing - failed Fall risk - hx of 2 falls within previous 12 mths. Fall on 07/15/16 resulted in ER visit.  Depression score: 12 Mini-Cog score: 19/20  Patient concerns:   Respiratory - Pt states she has SOB when walking long distances.   Memory - Pt states she is often misplacing items and sometimes forgets where she parks her car when shopping.  Nurse concerns:  None  Next PCP appt:   08/05/16 @ 1030

## 2016-08-05 NOTE — Progress Notes (Signed)
Pre visit review using our clinic review tool, if applicable. No additional management support is needed unless otherwise documented below in the visit note. 

## 2016-08-05 NOTE — Assessment & Plan Note (Signed)
Reviewed calcium/vit D intake. Update dexa.

## 2016-08-05 NOTE — Progress Notes (Signed)
BP 118/76 (BP Location: Right Arm, Patient Position: Sitting, Cuff Size: Normal)   Pulse 85   Temp 97.9 F (36.6 C) (Oral)   Ht 5' 4.75" (1.645 m)   Wt 154 lb 12 oz (70.2 kg)   SpO2 96%   BMI 25.95 kg/m    CC: AMW f/u visit Subjective:    Patient ID: Kristin Kramer, female    DOB: 10/08/1940, 76 y.o.   MRN: [id number]  HPI: Kristin Kramer is a 76 y.o. female presenting on 08/05/2016 for Follow-up   Saw Kristin Kramer today for medicare wellness visit. Note reviewed. 2 falls with injury this past year. Noticing some dyspnea with exertion. Worried about memory - see Lesia's note. Mini cog 19/20, PHQ9 = 12. She has bought books she previously red. Memory concerns have caused her to become more withdrawn. Noticing more trouble with calculation.   Latest fall s/p ER evaluation last month - records reviewed. Fell at [employer] while looking at [employer]. Ongoing R sided cervical neck pain. Maxillofacial and C spine CT reassuring as well as L tib-fib xray. Rx norco for pain.   She did have disc bulging at C3/4 and disc space loss at C5/6 and C6/7. She does endorse neck pain prior to fall, but definitely exacerbated after fall. Treating with ibuprofen. norco did not really help.   She did have sinus surgery earlier this year as well.   Depression - prior on lexapro, celexa, wellbutrin - caused tremors. High cymbalta doses cause tremors. She feels well on current sertraline dose (25mg  alternating with 27.5mg ).   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 Well woman exam - 2012 by prior PCP. Hysterectomy for fibroids, ovaries remained. Menopause early 27s. Denies pelvic pain.  Mammo 02/2016 DEXA Date: 06/2008 T -1.4 spine and hip. Recommended rpt by chiro. This has been scheduled.  Flu shot yearly  Pneumovax 2013, prevnar 2016 Tdap 10/2014 zostavax - 02/2013 Shingrix - discussed.  Advanced directives - Kristin Kramer is son Kristin Kramer. Will bring me copy.  Seat belt use  discussed Sunscreen use discussed. No changing moles on skin. Sees Dr Ubaldo Glassing.  Non smoker  Alcohol - occasional  Lives alone. Brother lives nearby.  Occupation: retired, was Network engineer and housewife  Edu: 1 yr college  Activity: limited by knee osteoarthritis, [place] [employer].  Diet: good water, fruits/vegetables daily   Relevant past medical, surgical, family and social history reviewed and updated as indicated. Interim medical history since our last visit reviewed. Allergies and medications reviewed and updated. Outpatient Medications Prior to Visit  Medication Sig Dispense Refill  . acetaminophen (TYLENOL) 325 MG tablet Take 650 mg by mouth every 6 (six) hours as needed. For pain    . Biotin (BIOTIN 5000) 5 MG CAPS Take 5 mg by mouth daily.     . calcium carbonate (OS-CAL) 600 MG TABS tablet Take 1 tablet (600 mg total) by mouth daily with breakfast.    . Cholecalciferol (VITAMIN D3) 5000 units CAPS Take 1 capsule (5,000 Units total) by mouth daily.    . Coenzyme Q10 (COQ-10 PO) Take 900 mg by mouth daily.     . fluticasone (FLONASE) 50 MCG/ACT nasal spray Place 1-2 sprays into both nostrils daily as needed. For allergies.    Marland Kitchen HYDROcodone-acetaminophen (NORCO/VICODIN) 5-325 MG tablet Take 1 tablet by mouth every 4 (four) hours as needed for moderate pain. 20 tablet 0  . magnesium gluconate (MAGONATE) 500 MG tablet Take 500 mg by mouth daily.    Marland Kitchen  metroNIDAZOLE (METROCREAM) 0.75 % cream Apply 1 application topically 2 (two) times daily. Applied daily to patient face    . niacinamide 500 MG tablet Take 500 mg by mouth daily. Per dermatology    . Omega-3 Fatty Acids (FISH OIL) 1000 MG CAPS Take 1,000 mg by mouth daily.     . Probiotic Product (PROBIOTIC PO) Take 1 capsule by mouth daily.     . ranitidine (ZANTAC) 150 MG tablet Take 150 mg by mouth 2 (two) times daily as needed. For acid reflux/indigestion/GERD  12  . sertraline (ZOLOFT) 25 MG tablet Alternate 1 tablet and 1.5 tablets  every other day 120 tablet 3  . TURMERIC CURCUMIN PO Take 900 mg by mouth daily.      No facility-administered medications prior to visit.      Per HPI unless specifically indicated in ROS section below Review of Systems     Objective:    BP 118/76 (BP Location: Right Arm, Patient Position: Sitting, Cuff Size: Normal)   Pulse 85   Temp 97.9 F (36.6 C) (Oral)   Ht 5' 4.75" (1.645 m)   Wt 154 lb 12 oz (70.2 kg)   SpO2 96%   BMI 25.95 kg/m   Wt Readings from Last 3 Encounters:  08/05/16 154 lb 12 oz (70.2 kg)  08/05/16 154 lb 12 oz (70.2 kg)  07/15/16 151 lb (68.5 kg)    Physical Exam  Constitutional: She is oriented to person, place, and time. She appears well-developed and well-nourished. No distress.  HENT:  Head: Normocephalic and atraumatic.  Right Ear: Hearing, tympanic membrane, external ear and ear canal normal.  Left Ear: Hearing, tympanic membrane, external ear and ear canal normal.  Nose: Nose normal.  Mouth/Throat: Uvula is midline, oropharynx is clear and moist and mucous membranes are normal. No oropharyngeal exudate, posterior oropharyngeal edema or posterior oropharyngeal erythema.  Eyes: Conjunctivae and EOM are normal. Pupils are equal, round, and reactive to light. No scleral icterus.  Neck: Normal range of motion. Neck supple. Carotid bruit is not present. No thyromegaly present.  Cardiovascular: Normal rate, regular rhythm, normal heart sounds and intact distal pulses.   No murmur heard. Pulses:      Radial pulses are 2+ on the right side, and 2+ on the left side.  Pulmonary/Chest: Effort normal and breath sounds normal. No respiratory distress. She has no wheezes. She has no rales.  Abdominal: Soft. Bowel sounds are normal. She exhibits no distension and no mass. There is no tenderness. There is no rebound and no guarding.  Musculoskeletal: Normal range of motion. She exhibits no edema.  Lymphadenopathy:    She has no cervical adenopathy.    Neurological: She is alert and oriented to person, place, and time.  CN grossly intact, station and gait intact  Skin: Skin is warm and dry. No rash noted.  Psychiatric: She has a normal mood and affect. Her behavior is normal. Judgment and thought content normal.  Nursing note and vitals reviewed.  Results for orders placed or performed in visit on 08/04/16  Lipid panel  Result Value Ref Range   Cholesterol 256 (H) 0 - 200 mg/dL   Triglycerides 150.0 (H) 0.0 - 149.0 mg/dL   HDL 59.70 >39.00 mg/dL   VLDL 30.0 0.0 - 40.0 mg/dL   LDL Cholesterol 166 (H) 0 - 99 mg/dL   Total CHOL/HDL Ratio 4    NonHDL 195.85   TSH  Result Value Ref Range   TSH 3.41 0.35 - 4.50  uIU/mL  T4, free  Result Value Ref Range   Free T4 0.75 0.60 - 1.60 ng/dL      Assessment & Plan:   Problem List Items Addressed This Visit    Advanced care planning/counseling discussion    Advanced directives - HCPOA is son Kristin Kramer. Will bring me copy. States she brought copy last year, we never received.       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 NSAIDs effective for relief. She has started seeing chiropractor. Reviewed ER workup including CT showing evidence of bulging disc and disc space loss.      HLD (hyperlipidemia)    Chronic. Intolerant to statins including RYR - agrees to restart RYR every other day.       MDD (major depressive disorder), recurrent episode, moderate (HCC)    Chronic, stable period. Continue alternating sertraline dose.       Memory deficit - Primary    Pt endorses worsening memory concerns, no true deficits found today. I encouraged she start b12 1031mcg daily, continue social engagement, work on memory exercises regularly as well as Kramer. RTC 6 mo, consider geriatric assessment at that time.       Osteopenia    Reviewed calcium/vit D intake. Update dexa.           Follow up plan: Return in about 6 months (around  02/04/2017) for follow up visit.  [name], MD

## 2016-08-05 NOTE — Assessment & Plan Note (Signed)
Pt endorses worsening memory concerns, no true deficits found today. I encouraged she start b12 1097mcg daily, continue social engagement, work on memory exercises regularly as well as reading. RTC 6 mo, consider geriatric assessment at that time.

## 2016-08-05 NOTE — Patient Instructions (Addendum)
Check with pharmacy about new 2 shot shingles series (shingrix).  Bring me copy of your living will again.  Start b12 vitamin daily. If no better, return for formal memory assessment.  Restart red yeast rice. You are doing well today. Return as needed or in 6 months for follow up visit   Health Maintenance, Female Adopting a healthy lifestyle and getting preventive care can go a long way to promote health and wellness. Talk with your health care provider about what schedule of regular examinations is right for you. This is a good chance for you to check in with your provider about disease prevention and staying healthy. In between checkups, there are plenty of things you can do on your own. Experts have done a lot of research about which lifestyle changes and preventive measures are most likely to keep you healthy. Ask your health care provider for more information. Weight and diet Eat a healthy diet  Be sure to include plenty of vegetables, fruits, low-fat dairy products, and lean protein.  Do not eat a lot of foods high in solid fats, added sugars, or salt.  Get regular exercise. This is one of the most important things you can do for your health. ? Most adults should exercise for at least 150 minutes each week. The exercise should increase your heart rate and make you sweat (moderate-intensity exercise). ? Most adults should also do strengthening exercises at least twice a week. This is in addition to the moderate-intensity exercise.  Maintain a healthy weight  Body mass index (BMI) is a measurement that can be used to identify possible weight problems. It estimates body fat based on height and weight. Your health care provider can help determine your BMI and help you achieve or maintain a healthy weight.  For females 53 years of age and older: ? A BMI below 18.5 is considered underweight. ? A BMI of 18.5 to 24.9 is normal. ? A BMI of 25 to 29.9 is considered overweight. ? A BMI of 30  and above is considered obese.  Watch levels of cholesterol and blood lipids  You should start having your blood tested for lipids and cholesterol at 76 years of age, then have this test every 5 years.  You may need to have your cholesterol levels checked more often if: ? Your lipid or cholesterol levels are high. ? You are older than 76 years of age. ? You are at high risk for heart disease.  Cancer screening Lung Cancer  Lung cancer screening is recommended for adults 28-49 years old who are at high risk for lung cancer because of a history of smoking.  A yearly low-dose CT scan of the lungs is recommended for people who: ? Currently smoke. ? Have quit within the past 15 years. ? Have at least a 30-pack-year history of smoking. A pack year is smoking an average of one pack of cigarettes a day for 1 year.  Yearly screening should continue until it has been 15 years since you quit.  Yearly screening should stop if you develop a health problem that would prevent you from having lung cancer treatment.  Breast Cancer  Practice breast self-awareness. This means understanding how your breasts normally appear and feel.  It also means doing regular breast self-exams. Let your health care provider know about any changes, no matter how small.  If you are in your 20s or 30s, you should have a clinical breast exam (CBE) by a health care provider every 1-3  years as part of a regular health exam.  If you are 27 or older, have a CBE every year. Also consider having a breast X-ray (mammogram) every year.  If you have a family history of breast cancer, talk to your health care provider about genetic screening.  If you are at high risk for breast cancer, talk to your health care provider about having an MRI and a mammogram every year.  Breast cancer gene (BRCA) assessment is recommended for women who have family members with BRCA-related cancers. BRCA-related cancers  include: ? Breast. ? Ovarian. ? Tubal. ? Peritoneal cancers.  Results of the assessment will determine the need for genetic counseling and BRCA1 and BRCA2 testing.  Cervical Cancer Your health care provider may recommend that you be screened regularly for cancer of the pelvic organs (ovaries, uterus, and vagina). This screening involves a pelvic examination, including checking for microscopic changes to the surface of your cervix (Pap test). You may be encouraged to have this screening done every 3 years, beginning at age 57.  For women ages 42-65, health care providers may recommend pelvic exams and Pap testing every 3 years, or they may recommend the Pap and pelvic exam, combined with testing for human papilloma virus (HPV), every 5 years. Some types of HPV increase your risk of cervical cancer. Testing for HPV may also be done on women of any age with unclear Pap test results.  Other health care providers may not recommend any screening for nonpregnant women who are considered low risk for pelvic cancer and who do not have symptoms. Ask your health care provider if a screening pelvic exam is right for you.  If you have had past treatment for cervical cancer or a condition that could lead to cancer, you need Pap tests and screening for cancer for at least 20 years after your treatment. If Pap tests have been discontinued, your risk factors (such as having a new sexual partner) need to be reassessed to determine if screening should resume. Some women have medical problems that increase the chance of getting cervical cancer. In these cases, your health care provider may recommend more frequent screening and Pap tests.  Colorectal Cancer  This type of cancer can be detected and often prevented.  Routine colorectal cancer screening usually begins at 76 years of age and continues through 76 years of age.  Your health care provider may recommend screening at an earlier age if you have risk factors  for colon cancer.  Your health care provider may also recommend using home test kits to check for hidden blood in the stool.  A small camera at the end of a tube can be used to examine your colon directly (sigmoidoscopy or colonoscopy). This is done to check for the earliest forms of colorectal cancer.  Routine screening usually begins at age 81.  Direct examination of the colon should be repeated every 5-10 years through 76 years of age. However, you may need to be screened more often if early forms of precancerous polyps or small growths are found.  Skin Cancer  Check your skin from head to toe regularly.  Tell your health care provider about any new moles or changes in moles, especially if there is a change in a mole's shape or color.  Also tell your health care provider if you have a mole that is larger than the size of a pencil eraser.  Always use sunscreen. Apply sunscreen liberally and repeatedly throughout the day.  Protect yourself  by wearing long sleeves, pants, a wide-brimmed hat, and sunglasses whenever you are outside.  Heart disease, diabetes, and high blood pressure  High blood pressure causes heart disease and increases the risk of stroke. High blood pressure is more likely to develop in: ? People who have blood pressure in the high end of the normal range (130-139/85-89 mm Hg). ? People who are overweight or obese. ? People who are African American.  If you are 18-39 years of age, have your blood pressure checked every 3-5 years. If you are 40 years of age or older, have your blood pressure checked every year. You should have your blood pressure measured twice-once when you are at a hospital or clinic, and once when you are not at a hospital or clinic. Record the average of the two measurements. To check your blood pressure when you are not at a hospital or clinic, you can use: ? An automated blood pressure machine at a pharmacy. ? A home blood pressure monitor.  If  you are between 55 years and 79 years old, ask your health care provider if you should take aspirin to prevent strokes.  Have regular diabetes screenings. This involves taking a blood sample to check your fasting blood sugar level. ? If you are at a normal weight and have a low risk for diabetes, have this test once every three years after 76 years of age. ? If you are overweight and have a high risk for diabetes, consider being tested at a younger age or more often. Preventing infection Hepatitis B  If you have a higher risk for hepatitis B, you should be screened for this virus. You are considered at high risk for hepatitis B if: ? You were born in a country where hepatitis B is common. Ask your health care provider which countries are considered high risk. ? Your parents were born in a high-risk country, and you have not been immunized against hepatitis B (hepatitis B vaccine). ? You have HIV or AIDS. ? You use needles to inject street drugs. ? You live with someone who has hepatitis B. ? You have had sex with someone who has hepatitis B. ? You get hemodialysis treatment. ? You take certain medicines for conditions, including cancer, organ transplantation, and autoimmune conditions.  Hepatitis C  Blood testing is recommended for: ? Everyone born from 1945 through 1965. ? Anyone with known risk factors for hepatitis C.  Sexually transmitted infections (STIs)  You should be screened for sexually transmitted infections (STIs) including gonorrhea and chlamydia if: ? You are sexually active and are younger than 76 years of age. ? You are older than 76 years of age and your health care provider tells you that you are at risk for this type of infection. ? Your sexual activity has changed since you were last screened and you are at an increased risk for chlamydia or gonorrhea. Ask your health care provider if you are at risk.  If you do not have HIV, but are at risk, it may be recommended  that you take a prescription medicine daily to prevent HIV infection. This is called pre-exposure prophylaxis (PrEP). You are considered at risk if: ? You are sexually active and do not regularly use condoms or know the HIV status of your partner(s). ? You take drugs by injection. ? You are sexually active with a partner who has HIV.  Talk with your health care provider about whether you are at high risk of being infected   with HIV. If you choose to begin PrEP, you should first be tested for HIV. You should then be tested every 3 months for as long as you are taking PrEP. Pregnancy  If you are premenopausal and you may become pregnant, ask your health care provider about preconception counseling.  If you may become pregnant, take 400 to 800 micrograms (mcg) of folic acid every day.  If you want to prevent pregnancy, talk to your health care provider about birth control (contraception). Osteoporosis and menopause  Osteoporosis is a disease in which the bones lose minerals and strength with aging. This can result in serious bone fractures. Your risk for osteoporosis can be identified using a bone density scan.  If you are 65 years of age or older, or if you are at risk for osteoporosis and fractures, ask your health care provider if you should be screened.  Ask your health care provider whether you should take a calcium or vitamin D supplement to lower your risk for osteoporosis.  Menopause may have certain physical symptoms and risks.  Hormone replacement therapy may reduce some of these symptoms and risks. Talk to your health care provider about whether hormone replacement therapy is right for you. Follow these instructions at home:  Schedule regular health, dental, and eye exams.  Stay current with your immunizations.  Do not use any tobacco products including cigarettes, chewing tobacco, or electronic cigarettes.  If you are pregnant, do not drink alcohol.  If you are  breastfeeding, limit how much and how often you drink alcohol.  Limit alcohol intake to no more than 1 drink per day for nonpregnant women. One drink equals 12 ounces of beer, 5 ounces of wine, or 1 ounces of hard liquor.  Do not use street drugs.  Do not share needles.  Ask your health care provider for help if you need support or information about quitting drugs.  Tell your health care provider if you often feel depressed.  Tell your health care provider if you have ever been abused or do not feel safe at home. This information is not intended to replace advice given to you by your health care provider. Make sure you discuss any questions you have with your health care provider. Document Released: 09/06/2010 Document Revised: 07/30/2015 Document Reviewed: 11/25/2014 Elsevier Interactive Patient Education  2018 [employer]  

## 2016-08-05 NOTE — Assessment & Plan Note (Signed)
Chronic, stable period. Continue alternating sertraline dose.

## 2016-08-05 NOTE — Assessment & Plan Note (Signed)
Advanced directives - Chauncey Reading is son Saralyn Pilar. Will bring me copy. States she brought copy last year, we never received.

## 2016-08-05 NOTE — Assessment & Plan Note (Signed)
Ongoing cervical neck pain after fall. NSAIDs effective for relief. She has started seeing chiropractor. Reviewed ER workup including CT showing evidence of bulging disc and disc space loss.

## 2016-08-05 NOTE — Patient Instructions (Signed)
Ms. Blanda , Thank you for taking time to come for your Medicare Wellness Visit. I appreciate your ongoing commitment to your health goals. Please review the following plan we discussed and let me know if I can assist you in the future.   These are the goals we discussed: Goals    . Increase physical activity          Starting 08/06/2016, I will attempt to walk for 15 min daily.        This is a list of the screening recommended for you and due dates:  Health Maintenance  Topic Date Due  . Flu Shot  10/05/2016  . Colon Cancer Screening  08/28/2019  . DTaP/Tdap/Td vaccine (2 - Td) 10/21/2024  . Tetanus Vaccine  10/21/2024  . DEXA scan (bone density measurement)  Completed  . Pneumonia vaccines  Completed   Preventive Care for Adults  A healthy lifestyle and preventive care can promote health and wellness. Preventive health guidelines for adults include the following key practices.  . A routine yearly physical is a good way to check with your health care provider about your health and preventive screening. It is a chance to share any concerns and updates on your health and to receive a thorough exam.  . Visit your dentist for a routine exam and preventive care every 6 months. Brush your teeth twice a day and floss once a day. Good oral hygiene prevents tooth decay and gum disease.  . The frequency of eye exams is based on your age, health, family medical history, use  of contact lenses, and other factors. Follow your health care provider's ecommendations for frequency of eye exams.  . Eat a healthy diet. Foods like vegetables, fruits, whole grains, low-fat dairy products, and lean protein foods contain the nutrients you need without too many calories. Decrease your intake of foods high in solid fats, added sugars, and salt. Eat the right amount of calories for you. Get information about a proper diet from your health care provider, if necessary.  . Regular physical exercise is one of  the most important things you can do for your health. Most adults should get at least 150 minutes of moderate-intensity exercise (any activity that increases your heart rate and causes you to sweat) each week. In addition, most adults need muscle-strengthening exercises on 2 or more days a week.  Silver Sneakers may be a benefit available to you. To determine eligibility, you may visit the website: www.silversneakers.com or contact program at 412-162-4951 Mon-Fri between 8AM-8PM.   . Maintain a healthy weight. The body mass index (BMI) is a screening tool to identify possible weight problems. It provides an estimate of body fat based on height and weight. Your health care provider can find your BMI and can help you achieve or maintain a healthy weight.   For adults 20 years and older: ? A BMI below 18.5 is considered underweight. ? A BMI of 18.5 to 24.9 is normal. ? A BMI of 25 to 29.9 is considered overweight. ? A BMI of 30 and above is considered obese.   . Maintain normal blood lipids and cholesterol levels by exercising and minimizing your intake of saturated fat. Eat a balanced diet with plenty of fruit and vegetables. Blood tests for lipids and cholesterol should begin at age 75 and be repeated every 5 years. If your lipid or cholesterol levels are high, you are over 50, or you are at high risk for heart disease, you may  need your cholesterol levels checked more frequently. Ongoing high lipid and cholesterol levels should be treated with medicines if diet and exercise are not working.  . If you smoke, find out from your health care provider how to quit. If you do not use tobacco, please do not start.  . If you choose to drink alcohol, please do not consume more than 2 drinks per day. One drink is considered to be 12 ounces (355 mL) of beer, 5 ounces (148 mL) of wine, or 1.5 ounces (44 mL) of liquor.  . If you are 44-3 years old, ask your health care provider if you should take aspirin to  prevent strokes.  . Use sunscreen. Apply sunscreen liberally and repeatedly throughout the day. You should seek shade when your shadow is shorter than you. Protect yourself by wearing long sleeves, pants, a wide-brimmed hat, and sunglasses year round, whenever you are outdoors.  . Once a month, do a whole body skin exam, using a mirror to look at the skin on your back. Tell your health care provider of new moles, moles that have irregular borders, moles that are larger than a pencil eraser, or moles that have changed in shape or color.

## 2016-08-05 NOTE — Assessment & Plan Note (Signed)
Chronic. Intolerant to statins including RYR - agrees to restart RYR every other day.

## 2016-08-05 NOTE — Progress Notes (Signed)
Subjective:   Kristin Kramer is a 76 y.o. female who presents for Medicare Annual (Subsequent) preventive examination.  Review of Systems:  N/A Cardiac Risk Factors include: advanced age (>27men, >1 women);dyslipidemia     Objective:     Vitals: BP 118/76 (BP Location: Right Arm, Patient Position: Sitting, Cuff Size: Normal)   Pulse 85   Temp 97.9 F (36.6 C) (Oral)   Ht 5' 4.75" (1.645 m) Comment: no shoes  Wt 154 lb 12 oz (70.2 kg)   SpO2 96%   BMI 25.95 kg/m   Body mass index is 25.95 kg/m.   Tobacco History  Smoking Status  . Never Smoker  Smokeless Tobacco  . Never Used     Counseling given: No   Past Medical History:  Diagnosis Date  . Abdominal pain, other specified site 10.2014   [facility] ER - mesenteric adenitis vs sclerosing mesenteritis vs nonspecific lymphadenitis  . Abnormal ECG    a. left axis deviation;  b. 12/2011 Echo: EF 55-60%, no rwma, pasp 91mmHg.  . Arthritis   . Depression    prior on lexapro, celexa, wellbutrin.  high cymbalta doses cause tremors  . H/O seasonal allergies   . History of pneumonia   . History of UTI    12/2011 - Treatment began preoperatively with CIPRO 500mg  BID  . HLD (hyperlipidemia)    statin intolerance  . Knee osteoarthritis 2013, 2014   bilateral s/p B TKR Noemi Chapel)  . Knee osteoarthritis   . Migraines   . Osteopenia   . Post-operative nausea and vomiting   . Refusal of blood transfusions as patient is Jehovah's Witness   . Seasonal allergies    cats, dust, mold, roaches  . 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 Isac Sarna), R tibia - then recurrent  . Vitamin D deficiency    Past Surgical History:  Procedure Laterality Date  . BREAST ENHANCEMENT SURGERY    . BUNIONECTOMY  2013   R and L foot  . CATARACT EXTRACTION  2013   R, pending L  . COLONOSCOPY  2007   no records received  . COLONOSCOPY  08/2014   tubular adenoma, rpt 5 yrs Fuller Plan)  . COSMETIC SURGERY  2002   face lift  . dexa  06/2008   T -1.4 spine and hip  . ETHMOIDECTOMY Bilateral 05/19/2016   Procedure: ETHMOIDECTOMY;  Surgeon: Carloyn Manner, MD;  Location: [facility] ORS;  Service: ENT;  Laterality: Bilateral;  . EYE SURGERY     bilateral cataract surgery  . FRONTAL SINUS EXPLORATION Bilateral 05/19/2016   Procedure: FRONTAL SINUS EXPLORATION;  Surgeon: Carloyn Manner, MD;  Location: [facility] ORS;  Service: ENT;  Laterality: Bilateral;  . JOINT REPLACEMENT Bilateral 2015   knees  . MAXILLARY ANTROSTOMY Bilateral 05/19/2016   Procedure: MAXILLARY ANTROSTOMY;  Surgeon: Carloyn Manner, MD;  Location: [facility] ORS;  Service: ENT;  Laterality: Bilateral;  . MOHS SURGERY  2011   R leg  . PARTIAL HYSTERECTOMY  1980   fibroids, ovaries remained  . SINUS ENDO W/FUSION N/A 05/19/2016   Procedure: ENDOSCOPIC SINUS SURGERY WITH NAVIGATION;  Surgeon: Carloyn Manner, MD;  Location: [facility] ORS;  Service: ENT;  Laterality: N/A;  . SPHENOIDECTOMY Bilateral 05/19/2016   Procedure: Coralee Pesa;  Surgeon: Carloyn Manner, MD;  Location: [facility] ORS;  Service: ENT;  Laterality: Bilateral;  . SQUAMOUS CELL CARCINOMA EXCISION Right 07/13/2016  Dr. Rolm Bookbinder, [facility]  . TONSILLECTOMY AND ADENOIDECTOMY  1945  . TOTAL KNEE ARTHROPLASTY  01/02/2012   Right - Surgeon: Lorn Junes, MD  . TOTAL KNEE ARTHROPLASTY Left 03/18/2013   Surgeon: Lorn Junes, MD   Family History  Problem Relation Age of Onset  . Cancer Mother        lung, smoker  . Cancer Father        lung, smoker  . Cancer Maternal Grandmother 67       leukemia  . Coronary artery disease Paternal Grandmother 39       sudden cardiac death  . Diabetes Neg Hx   . Stroke Neg Hx    History  Sexual Activity  . Sexual activity: No    Outpatient Encounter Prescriptions as of 08/05/2016  Medication Sig  . acetaminophen (TYLENOL) 325 MG tablet Take 650 mg by mouth every  6 (six) hours as needed. For pain  . Biotin (BIOTIN 5000) 5 MG CAPS Take 5 mg by mouth daily.   . calcium carbonate (OS-CAL) 600 MG TABS tablet Take 1 tablet (600 mg total) by mouth daily with breakfast.  . Cholecalciferol (VITAMIN D3) 5000 units CAPS Take 1 capsule (5,000 Units total) by mouth daily.  . Coenzyme Q10 (COQ-10 PO) Take 900 mg by mouth daily.   . fluticasone (FLONASE) 50 MCG/ACT nasal spray Place 1-2 sprays into both nostrils daily as needed. For allergies.  Marland Kitchen HYDROcodone-acetaminophen (NORCO/VICODIN) 5-325 MG tablet Take 1 tablet by mouth every 4 (four) hours as needed for moderate pain.  . magnesium gluconate (MAGONATE) 500 MG tablet Take 500 mg by mouth daily.  . metroNIDAZOLE (METROCREAM) 0.75 % cream Apply 1 application topically 2 (two) times daily. Applied daily to patient face  . niacinamide 500 MG tablet Take 500 mg by mouth daily.  . Omega-3 Fatty Acids (FISH OIL) 1000 MG CAPS Take 1,000 mg by mouth daily.   . Probiotic Product (PROBIOTIC PO) Take 1 capsule by mouth daily.   . ranitidine (ZANTAC) 150 MG tablet Take 150 mg by mouth 2 (two) times daily as needed. For acid reflux/indigestion/GERD  . sertraline (ZOLOFT) 25 MG tablet Alternate 1 tablet and 1.5 tablets every other day  . TURMERIC CURCUMIN PO Take 900 mg by mouth daily.   . [DISCONTINUED] azelastine (ASTELIN) 0.1 % nasal spray Place 1-2 sprays into both nostrils every 12 (twelve) hours as needed. For nasal drainage  . [DISCONTINUED] docusate sodium (COLACE) 100 MG capsule Take 1 capsule (100 mg total) by mouth 2 (two) times daily.  . [DISCONTINUED] ondansetron (ZOFRAN) 4 MG tablet Take 1 tablet (4 mg total) by mouth every 8 (eight) hours as needed for nausea or vomiting.  . [DISCONTINUED] PROAIR HFA 108 (90 Base) MCG/ACT inhaler Inhale 1-2 puffs into the lungs every 6 (six) hours as needed for wheezing or shortness of breath.   No facility-administered encounter medications on file as of 08/05/2016.      Activities of Daily Living In your present state of health, do you have any difficulty performing the following activities: 08/05/2016 05/12/2016  Hearing? N N  Vision? N N  Difficulty concentrating or making decisions? Tempie Donning  Walking or climbing stairs? Y N  Dressing or bathing? N N  Doing errands, shopping? N N  Preparing Food and eating ? N -  Using the Toilet? N -  In the past six months, have you accidently leaked urine? N -  Do you have problems with loss of  bowel control? N -  Managing your Medications? N -  Managing your Finances? N -  Housekeeping or managing your Housekeeping? N -  Some recent data might be hidden    Patient Care Team: [name], MD as PCP - General (Family Medicine) [name], OD as Consulting Physician (Optometry)    Assessment:     Hearing Screening   125Hz  250Hz  500Hz  1000Hz  2000Hz  3000Hz  4000Hz  6000Hz  8000Hz   Right ear:   40 40 40  40    Left ear:   0 40 40  40    Vision Screening Comments: Last vision exam in May 2018 with Dr. Maryruth Hancock B.    Exercise Activities and Dietary recommendations Current Exercise Habits: The patient does not participate in regular exercise at present, Exercise limited by: None identified  Goals    . Increase physical activity          Starting 08/06/2016, I will attempt to walk for 15 min daily.       Fall Risk Fall Risk  08/05/2016 08/04/2015 07/31/2014 01/14/2013  Falls in the past year? Yes No Yes No  Number falls in past yr: 2 or more - 1 -  Injury with Fall? Yes - - -   Depression Screen PHQ 2/9 Scores 08/05/2016 08/04/2015 07/31/2014 01/14/2013  PHQ - 2 Score 3 2 0 3  PHQ- 9 Score 12 13 - 4     Cognitive Function MMSE - Mini Mental State Exam 08/05/2016  Orientation to time 5  Orientation to Place 5  Registration 3  Attention/ Calculation 0  Recall 3  Language- name 2 objects 0  Language- repeat 1  Language- follow 3 step command 2  Language- follow 3 step command-comments pt was unable to  follow 1 step of 3 step command  Language- read & follow direction 0  Write a sentence 0  Copy design 0  Total score 19     PLEASE NOTE: A Mini-Cog screen was completed. Maximum score is 20. A value of 0 denotes this part of Folstein MMSE was not completed or the patient failed this part of the Mini-Cog screening.   Mini-Cog Screening Orientation to Time - Max 5 pts Orientation to Place - Max 5 pts Registration - Max 3 pts Recall - Max 3 pts Language Repeat - Max 1 pts Language Follow 3 Step Command - Max 3 pts     Immunization History  Administered Date(s) Administered  . Influenza Split 12/01/2011  . Influenza, High Dose Seasonal PF 02/10/2014  . Influenza,inj,Quad PF,36+ Mos 01/14/2013, 12/29/2014, 02/04/2016  . Pneumococcal Conjugate-13 07/31/2014  . Pneumococcal Polysaccharide-23 12/01/2011  . Tdap 10/22/2014  . Zoster 02/14/2013   Screening Tests Health Maintenance  Topic Date Due  . INFLUENZA VACCINE  10/05/2016  . COLONOSCOPY  08/28/2019  . DTaP/Tdap/Td (2 - Td) 10/21/2024  . TETANUS/TDAP  10/21/2024  . DEXA SCAN  Completed  . PNA vac Low Risk Adult  Completed      Plan:     I have personally reviewed and addressed the Medicare Annual Wellness questionnaire and have noted the following in the patient's chart:  A. Medical and social history B. Use of alcohol, tobacco or illicit drugs  C. Current medications and supplements D. Functional ability and status E.  Nutritional status F.  Physical activity G. Advance directives H. List of other physicians I.  Hospitalizations, surgeries, and ER visits in previous 12 months J.  [place] to include hearing, vision, cognitive, depression L.  Referrals and appointments - none  In addition, I have reviewed and discussed with patient certain preventive protocols, quality metrics, and best practice recommendations. A written personalized care plan for preventive services as well as general preventive health  recommendations were provided to patient.  See attached scanned questionnaire for additional information.   Signed,   Lindell Noe, MHA, BS, LPN Health Coach

## 2016-08-06 NOTE — Progress Notes (Signed)
I reviewed health advisor's note, was available for consultation, and agree with documentation and plan.  

## 2016-08-15 ENCOUNTER — Ambulatory Visit
Admission: RE | Admit: 2016-08-15 | Discharge: 2016-08-15 | Disposition: A | Discharge status: Home / Self Care | Payer: Medicare Other | Source: Ambulatory Visit | Attending: Family Medicine | Admitting: Family Medicine

## 2016-08-15 DIAGNOSIS — M85851 Other specified disorders of bone density and structure, right thigh: Secondary | ICD-10-CM | POA: Diagnosis not present

## 2016-08-15 DIAGNOSIS — E2839 Other primary ovarian failure: Secondary | ICD-10-CM

## 2016-08-15 DIAGNOSIS — Z780 Asymptomatic menopausal state: Secondary | ICD-10-CM | POA: Diagnosis not present

## 2016-08-21 ENCOUNTER — Encounter: Payer: Self-pay | Admitting: Family Medicine

## 2016-09-23 ENCOUNTER — Other Ambulatory Visit: Payer: Self-pay

## 2016-09-30 DIAGNOSIS — H9209 Otalgia, unspecified ear: Secondary | ICD-10-CM | POA: Diagnosis not present

## 2016-09-30 DIAGNOSIS — J324 Chronic pansinusitis: Secondary | ICD-10-CM | POA: Diagnosis not present

## 2016-10-12 DIAGNOSIS — Z85828 Personal history of other malignant neoplasm of skin: Secondary | ICD-10-CM | POA: Diagnosis not present

## 2016-10-12 DIAGNOSIS — L812 Freckles: Secondary | ICD-10-CM | POA: Diagnosis not present

## 2016-10-12 DIAGNOSIS — L821 Other seborrheic keratosis: Secondary | ICD-10-CM | POA: Diagnosis not present

## 2016-10-12 DIAGNOSIS — L570 Actinic keratosis: Secondary | ICD-10-CM | POA: Diagnosis not present

## 2016-10-12 DIAGNOSIS — D1801 Hemangioma of skin and subcutaneous tissue: Secondary | ICD-10-CM | POA: Diagnosis not present

## 2016-10-12 DIAGNOSIS — D485 Neoplasm of uncertain behavior of skin: Secondary | ICD-10-CM | POA: Diagnosis not present

## 2016-10-12 DIAGNOSIS — L720 Epidermal cyst: Secondary | ICD-10-CM | POA: Diagnosis not present

## 2016-10-12 DIAGNOSIS — L814 Other melanin hyperpigmentation: Secondary | ICD-10-CM | POA: Diagnosis not present

## 2017-01-01 DIAGNOSIS — Z23 Encounter for immunization: Secondary | ICD-10-CM | POA: Diagnosis not present

## 2017-01-09 ENCOUNTER — Other Ambulatory Visit: Payer: Self-pay | Admitting: Family Medicine

## 2017-01-09 DIAGNOSIS — Z1231 Encounter for screening mammogram for malignant neoplasm of breast: Secondary | ICD-10-CM

## 2017-01-11 ENCOUNTER — Ambulatory Visit
Admission: RE | Admit: 2017-01-11 | Discharge: 2017-01-11 | Disposition: A | Discharge status: Home / Self Care | Payer: Medicare Other | Source: Ambulatory Visit | Attending: Orthopedic Surgery | Admitting: Orthopedic Surgery

## 2017-01-11 ENCOUNTER — Other Ambulatory Visit: Payer: Self-pay | Admitting: Specialist

## 2017-01-11 DIAGNOSIS — M25461 Effusion, right knee: Secondary | ICD-10-CM | POA: Diagnosis not present

## 2017-01-11 DIAGNOSIS — Z96652 Presence of left artificial knee joint: Secondary | ICD-10-CM | POA: Diagnosis not present

## 2017-01-11 DIAGNOSIS — M25561 Pain in right knee: Secondary | ICD-10-CM | POA: Insufficient documentation

## 2017-01-11 DIAGNOSIS — Z96653 Presence of artificial knee joint, bilateral: Secondary | ICD-10-CM | POA: Insufficient documentation

## 2017-01-11 DIAGNOSIS — Z96651 Presence of right artificial knee joint: Secondary | ICD-10-CM | POA: Diagnosis not present

## 2017-01-11 DIAGNOSIS — M25562 Pain in left knee: Secondary | ICD-10-CM | POA: Diagnosis not present

## 2017-01-11 DIAGNOSIS — M25569 Pain in unspecified knee: Secondary | ICD-10-CM

## 2017-01-11 DIAGNOSIS — Z471 Aftercare following joint replacement surgery: Secondary | ICD-10-CM | POA: Diagnosis not present

## 2017-02-03 ENCOUNTER — Ambulatory Visit (INDEPENDENT_AMBULATORY_CARE_PROVIDER_SITE_OTHER): Payer: Medicare Other | Admitting: Family Medicine

## 2017-02-03 ENCOUNTER — Encounter: Payer: Self-pay | Admitting: Family Medicine

## 2017-02-03 VITALS — BP 120/78 | HR 76 | Temp 97.9°F | Wt 154.0 lb

## 2017-02-03 DIAGNOSIS — R413 Other amnesia: Secondary | ICD-10-CM

## 2017-02-03 MED ORDER — DONEPEZIL HCL 5 MG PO TABS: 5.0000 mg | ORAL_TABLET | Freq: Every day | ORAL | 6 refills | Status: DC

## 2017-02-03 NOTE — Progress Notes (Signed)
BP 120/78 (BP Location: Left Arm, Patient Position: Sitting, Cuff Size: Normal)   Pulse 76   Temp 97.9 F (36.6 C) (Oral)   Wt 154 lb (69.9 kg)   SpO2 95%   BMI 25.83 kg/m    CC: 6 mo f/u visit Subjective:    Patient ID: Kristin Kramer, female    DOB: 04/30/1940, 76 y.o.   MRN: [id number]  HPI: Kristin Kramer is a 76 y.o. female presenting on 02/03/2017 for 6 mo follow-up   See prior note for details. Pt worried about memory deficits despite normal testing. Last visit we started b12 1047mcg daily. I encouraged memory exercises, reading, social engagement. Here for geriatric assessment. She remains concerned. She lives alone. Son lives in [place] she sees him Q4 months. Sees friends a couple times a week.  She balances checkbook, she pays bills, she cooks some. Does not get lost driving.  Considering independent living scenario.   Borderline high osteopenia - sees chiropractor. H/o cervical disc issue after fall. Started working with Physiological scientist. DEXA 08/15/2016 - rec rpt 2 yrs.   Mother with h/o severe dementia late 57s, was placed in memory unit, passed away age 27 from lung cancer.   Geriatric Assessment: Activities of Daily Living:     Bathing- independent    Dressing- independent    Eating- independent    Toileting- independent    Transferring- independent    Continence- independent - wears pad for occasional leakage  Overall Assessment: independent  Instrumental Activities of Daily Living:     Transportation- independent    Meal/Food Preparation- independent    Shopping Errands- independent    Housekeeping/Chores- independent    Money Management/Finances- independent    Medication Management- independent    Ability to Use Telephone- independent    Laundry- independent Overall Assessment:  Independent   Mental Status Exam: (value/max value) 28/30 (missed 1 recall and 1 orientation - season)     Clock Drawing Score: 3/4 - missed correct placement of minute  hand.  Relevant past medical, surgical, family and social history reviewed and updated as indicated. Interim medical history since our last visit reviewed. Allergies and medications reviewed and updated. Outpatient Medications Prior to Visit  Medication Sig Dispense Refill  . acetaminophen (TYLENOL) 325 MG tablet Take 650 mg by mouth every 6 (six) hours as needed. For pain    . Biotin (BIOTIN 5000) 5 MG CAPS Take 5 mg by mouth daily.     . calcium carbonate (OS-CAL) 600 MG TABS tablet Take 1 tablet (600 mg total) by mouth daily with breakfast.    . Cholecalciferol (VITAMIN D3) 5000 units CAPS Take 1 capsule (5,000 Units total) by mouth daily.    . Coenzyme Q10 (COQ-10 PO) Take 900 mg by mouth daily.     . magnesium gluconate (MAGONATE) 500 MG tablet Take 500 mg by mouth daily.    . metroNIDAZOLE (METROCREAM) 0.75 % cream Apply 1 application topically 2 (two) times daily. Applied daily to patient face    . niacinamide 500 MG tablet Take 500 mg by mouth daily. Per dermatology    . Omega-3 Fatty Acids (FISH OIL) 1000 MG CAPS Take 1,000 mg by mouth daily.     . Potassium 99 MG TABS Take 1 tablet by mouth daily.    . Probiotic Product (PROBIOTIC PO) Take 1 capsule by mouth daily.     . ranitidine (ZANTAC) 150 MG tablet Take 150 mg by mouth 2 (two) times daily as  needed. For acid reflux/indigestion/GERD  12  . sertraline (ZOLOFT) 25 MG tablet Alternate 1 tablet and 1.5 tablets every other day 120 tablet 3  . TURMERIC CURCUMIN PO Take 900 mg by mouth daily.     . vitamin B-12 (CYANOCOBALAMIN) 1000 MCG tablet Take 1,000 mcg by mouth daily.    . fluticasone (FLONASE) 50 MCG/ACT nasal spray Place 1-2 sprays into both nostrils daily as needed. For allergies.    Marland Kitchen HYDROcodone-acetaminophen (NORCO/VICODIN) 5-325 MG tablet Take 1 tablet by mouth every 4 (four) hours as needed for moderate pain. 20 tablet 0   No facility-administered medications prior to visit.      Per HPI unless specifically indicated  in ROS section below Review of Systems     Objective:    BP 120/78 (BP Location: Left Arm, Patient Position: Sitting, Cuff Size: Normal)   Pulse 76   Temp 97.9 F (36.6 C) (Oral)   Wt 154 lb (69.9 kg)   SpO2 95%   BMI 25.83 kg/m   Wt Readings from Last 3 Encounters:  02/03/17 154 lb (69.9 kg)  08/05/16 154 lb 12 oz (70.2 kg)  08/05/16 154 lb 12 oz (70.2 kg)    Physical Exam  Constitutional: She appears well-developed and well-nourished. No distress.  Psychiatric: She has a normal mood and affect.  Nursing note and vitals reviewed.      Assessment & Plan:   Problem List Items Addressed This Visit    Memory deficit - Primary    MMSE today 28/30, clock drawing test 3/4 (missed correct minute hand placement), independent in all ADLs and IADLs 01/2017 Overall reassuring geriatric assessment today however pt does endorse persistent concern over worsening memory. She does have significant family history. She is interested in trial aricept. Discussed mechanism of action - pt aware this does not cure or reverse cognitive impairment but can help slow progress. Reviewed side effects to monitor for. I feel that given concern over mild memory impairment or possible very early stage cognitive impairment she may benefit from aricept. Start this 5mg  once daily. Reassess at next AMW.           Follow up plan: Return for annual exam, prior fasting for blood work, medicare wellness visit.  Kristin Bush, MD

## 2017-02-03 NOTE — Assessment & Plan Note (Addendum)
MMSE today 28/30, clock drawing test 3/4 (missed correct minute hand placement), independent in all ADLs and IADLs 01/2017 Overall reassuring geriatric assessment today however pt does endorse persistent concern over worsening memory. She does have significant family history. She is interested in trial aricept. Discussed mechanism of action - pt aware this does not cure or reverse cognitive impairment but can help slow progress. Reviewed side effects to monitor for. I feel that given concern over mild memory impairment or possible very early stage cognitive impairment she may benefit from aricept. Start this 5mg  once daily. Reassess at next AMW.

## 2017-02-03 NOTE — Patient Instructions (Addendum)
Good to see you today.  Testing is overall ok today.  Continue regular reading and memory exercises, continue regular social engagement to help keep mind sharp.  Trial aricept 5mg  once daily.  Return as needed or in 6 months for follow up visit.

## 2017-02-10 DIAGNOSIS — J340 Abscess, furuncle and carbuncle of nose: Secondary | ICD-10-CM | POA: Diagnosis not present

## 2017-02-10 DIAGNOSIS — J301 Allergic rhinitis due to pollen: Secondary | ICD-10-CM | POA: Diagnosis not present

## 2017-02-10 DIAGNOSIS — J324 Chronic pansinusitis: Secondary | ICD-10-CM | POA: Diagnosis not present

## 2017-02-15 ENCOUNTER — Ambulatory Visit
Admission: RE | Admit: 2017-02-15 | Discharge: 2017-02-15 | Disposition: A | Discharge status: Home / Self Care | Payer: Medicare Other | Source: Ambulatory Visit | Attending: Family Medicine | Admitting: Family Medicine

## 2017-02-15 DIAGNOSIS — Z1231 Encounter for screening mammogram for malignant neoplasm of breast: Secondary | ICD-10-CM | POA: Diagnosis not present

## 2017-02-15 LAB — HM MAMMOGRAPHY

## 2017-02-16 ENCOUNTER — Encounter: Payer: Self-pay | Admitting: Family Medicine

## 2017-02-22 ENCOUNTER — Other Ambulatory Visit: Payer: Self-pay | Admitting: Family Medicine

## 2017-03-03 ENCOUNTER — Telehealth: Payer: Self-pay

## 2017-03-03 NOTE — Telephone Encounter (Signed)
She's on low dose. Ok to just stop.

## 2017-03-03 NOTE — Telephone Encounter (Signed)
Left message on vm per dpr relaying message per Dr. G.  

## 2017-03-03 NOTE — Telephone Encounter (Signed)
Copied from [place]. Topic: General - Other >> Mar 03, 2017  8:33 AM Yvette Rack wrote: Reason for CRM: patient call to let Dr Darnell Level know that donepezil (ARICEPT) 5 MG tablet isn't working well for her she's having side affects from it No sleep crazy dreams agitated really bad she has been on it for 30 daays and want to know how to wing herself off of medicine

## 2017-04-06 ENCOUNTER — Encounter: Payer: Self-pay | Admitting: Family Medicine

## 2017-04-06 DIAGNOSIS — D485 Neoplasm of uncertain behavior of skin: Secondary | ICD-10-CM | POA: Diagnosis not present

## 2017-04-06 DIAGNOSIS — Z85828 Personal history of other malignant neoplasm of skin: Secondary | ICD-10-CM | POA: Diagnosis not present

## 2017-04-06 DIAGNOSIS — L820 Inflamed seborrheic keratosis: Secondary | ICD-10-CM | POA: Diagnosis not present

## 2017-04-06 DIAGNOSIS — L821 Other seborrheic keratosis: Secondary | ICD-10-CM | POA: Diagnosis not present

## 2017-04-06 DIAGNOSIS — L570 Actinic keratosis: Secondary | ICD-10-CM | POA: Diagnosis not present

## 2017-04-19 ENCOUNTER — Ambulatory Visit (INDEPENDENT_AMBULATORY_CARE_PROVIDER_SITE_OTHER): Payer: Medicare Other | Admitting: Family Medicine

## 2017-04-19 ENCOUNTER — Encounter: Payer: Self-pay | Admitting: Family Medicine

## 2017-04-19 VITALS — BP 118/62 | HR 76 | Temp 98.2°F | Wt 154.0 lb

## 2017-04-19 DIAGNOSIS — R0609 Other forms of dyspnea: Secondary | ICD-10-CM

## 2017-04-19 DIAGNOSIS — R0981 Nasal congestion: Secondary | ICD-10-CM | POA: Diagnosis not present

## 2017-04-19 DIAGNOSIS — R0600 Dyspnea, unspecified: Secondary | ICD-10-CM | POA: Insufficient documentation

## 2017-04-19 HISTORY — DX: Other forms of dyspnea: R06.09

## 2017-04-19 HISTORY — DX: Nasal congestion: R09.81

## 2017-04-19 MED ORDER — MONTELUKAST SODIUM 10 MG PO TABS: 10.0000 mg | ORAL_TABLET | Freq: Every day | ORAL | 3 refills | Status: DC

## 2017-04-19 MED ORDER — DONEPEZIL HCL 5 MG PO TABS: 5.0000 mg | ORAL_TABLET | Freq: Every day | ORAL | 6 refills | Status: DC

## 2017-04-19 MED ORDER — LORATADINE 10 MG PO TABS: 10.0000 mg | ORAL_TABLET | Freq: Every day | ORAL | Status: DC

## 2017-04-19 NOTE — Progress Notes (Signed)
BP 118/62 (BP Location: Left Arm, Patient Position: Sitting, Cuff Size: Normal)   Pulse 76   Temp 98.2 F (36.8 C) (Oral)   Wt 154 lb (69.9 kg)   SpO2 98%   BMI 25.83 kg/m    CC: sinus congestion Subjective:    Patient ID: Kristin Kramer, female    DOB: 31-May-1940, 77 y.o.   MRN: [id number]  HPI: Kristin Kramer is a 77 y.o. female presenting on 04/19/2017 for Sinus Problem (Sinus congestion, runny nose, head pressure, ear fullness and cough.Has had issues since sinus surgery in 05/2016. Tried a few meds from ENT, not helping.)   Ongoing sinus congestion since sinus surgery 05/2016 (sinus endoscopy with fusion, sphenoidectomy by Dr Pryor Ochoa). Chronic congestion, rhinorrhea of clear mucous, itchy watery eyes, and earaches. + ST, PNdrainage, HA described as sinus pressure. Chronic cough over the last 2 months. Some dyspnea that is intermittent and present for last 4 months - worse with exertion. Denies chest pain or tightness, dizziness, palpitations. No wheezing noted. New nasal sores have developed - using some nasal ointment per ENT. Has tried multiple nose sprays per ENT as well without improvement. She is using nasal saline. She have astelin and flonase at home. No known h/o asthma. No GERD.   She has previously tried zyrtec, claritin in the past but not recently.   She is back on aricept 5mg  daily.   Relevant past medical, surgical, family and social history reviewed and updated as indicated. Interim medical history since our last visit reviewed. Allergies and medications reviewed and updated. Outpatient Medications Prior to Visit  Medication Sig Dispense Refill  . acetaminophen (TYLENOL) 325 MG tablet Take 650 mg by mouth every 6 (six) hours as needed. For pain    . Biotin (BIOTIN 5000) 5 MG CAPS Take 5 mg by mouth daily.     . calcium carbonate (OS-CAL) 600 MG TABS tablet Take 1 tablet (600 mg total) by mouth daily with breakfast.    . Cholecalciferol (VITAMIN D3) 5000 units CAPS Take  1 capsule (5,000 Units total) by mouth daily.    . Coenzyme Q10 (COQ-10 PO) Take 900 mg by mouth daily.     . magnesium gluconate (MAGONATE) 500 MG tablet Take 500 mg by mouth daily.    . metroNIDAZOLE (METROCREAM) 0.75 % cream Apply 1 application topically 2 (two) times daily. Applied daily to patient face    . niacinamide 500 MG tablet Take 500 mg by mouth daily. Per dermatology    . Omega-3 Fatty Acids (FISH OIL) 1000 MG CAPS Take 1,000 mg by mouth daily.     . Potassium 99 MG TABS Take 1 tablet by mouth daily.    . Probiotic Product (PROBIOTIC PO) Take 1 capsule by mouth daily.     . ranitidine (ZANTAC) 150 MG tablet Take 150 mg by mouth 2 (two) times daily as needed. For acid reflux/indigestion/GERD  12  . sertraline (ZOLOFT) 25 MG tablet ALTERNATE 1 TABLET AND 1.5 TABLETS EVERY OTHER DAY 120 tablet 3  . TURMERIC CURCUMIN PO Take 900 mg by mouth daily.     . vitamin B-12 (CYANOCOBALAMIN) 1000 MCG tablet Take 1,000 mcg by mouth daily.    Marland Kitchen donepezil (ARICEPT) 5 MG tablet Take 5 mg by mouth daily.     No facility-administered medications prior to visit.      Per HPI unless specifically indicated in ROS section below Review of Systems     Objective:    BP 118/62 (  BP Location: Left Arm, Patient Position: Sitting, Cuff Size: Normal)   Pulse 76   Temp 98.2 F (36.8 C) (Oral)   Wt 154 lb (69.9 kg)   SpO2 98%   BMI 25.83 kg/m   Wt Readings from Last 3 Encounters:  04/19/17 154 lb (69.9 kg)  02/03/17 154 lb (69.9 kg)  08/05/16 154 lb 12 oz (70.2 kg)    Physical Exam  Constitutional: She appears well-developed and well-nourished. No distress.  HENT:  Head: Normocephalic and atraumatic.  Right Ear: Hearing, tympanic membrane, external ear and ear canal normal.  Left Ear: Hearing, tympanic membrane, external ear and ear canal normal.  Nose: No mucosal edema or rhinorrhea. Right sinus exhibits no maxillary sinus tenderness and no frontal sinus tenderness. Left sinus exhibits no  maxillary sinus tenderness and no frontal sinus tenderness.  Mouth/Throat: Uvula is midline, oropharynx is clear and moist and mucous membranes are normal. No oropharyngeal exudate, posterior oropharyngeal edema, posterior oropharyngeal erythema or tonsillar abscesses.  Eyes: Conjunctivae and EOM are normal. Pupils are equal, round, and reactive to light. No scleral icterus.  Neck: Normal range of motion. Neck supple.  Cardiovascular: Normal rate, regular rhythm, normal heart sounds and intact distal pulses.  No murmur heard. Pulmonary/Chest: Effort normal and breath sounds normal. No respiratory distress. She has no wheezes. She has no rales.  Lymphadenopathy:    She has no cervical adenopathy.  Skin: Skin is warm and dry. No rash noted.  Nursing note and vitals reviewed.  Results for orders placed or performed in visit on 02/16/17  [facility] MAMMOGRAPHY  Result Value Ref Range   HM Mammogram 0-4 Bi-Rad 0-4 Bi-Rad, Self Reported Normal   Lab Results  Component Value Date   WBC 5.7 05/06/2016   HGB 13.5 05/06/2016   HCT 40.1 05/06/2016   MCV 86.9 05/06/2016   PLT 266.0 05/06/2016       Assessment & Plan:   Problem List Items Addressed This Visit    Chronic nasal congestion - Primary    Ongoing since sinus surgery 05/2016. ENT eval unrevealing. Possible allergic rhinitis - rec restart claritin. If no better after 1 wk, rec trial singulair 10mg  daily x 1 month. Update with effect. Consider return to allergist.       Exertional dyspnea    Without other cardiac symptoms.  Possible related to allergies - will trial antihistamine, singulair and update with effect. If ongoing, will recommend further evaluation.          Meds ordered this encounter  Medications  . donepezil (ARICEPT) 5 MG tablet    Sig: Take 1 tablet (5 mg total) by mouth daily.    Dispense:  30 tablet    Refill:  6  . loratadine (CLARITIN) 10 MG tablet    Sig: Take 1 tablet (10 mg total) by mouth daily.  .  montelukast (SINGULAIR) 10 MG tablet    Sig: Take 1 tablet (10 mg total) by mouth at bedtime.    Dispense:  30 tablet    Refill:  3   No orders of the defined types were placed in this encounter.   Follow up plan: No Follow-up on file.  [name], MD

## 2017-04-19 NOTE — Assessment & Plan Note (Signed)
Ongoing since sinus surgery 05/2016. ENT eval unrevealing. Possible allergic rhinitis - rec restart claritin. If no better after 1 wk, rec trial singulair 10mg  daily x 1 month. Update with effect. Consider return to allergist.

## 2017-04-19 NOTE — Assessment & Plan Note (Addendum)
Without other cardiac symptoms.  Possible related to allergies - will trial antihistamine, singulair and update with effect. If ongoing, will recommend further evaluation.

## 2017-04-19 NOTE — Patient Instructions (Signed)
Start claritin or allegra daily in the morning.  If no better with this, start singulair prescription - printed out today.  If not better, let us know for return to Dr Donneta Romberg.

## 2017-06-16 DIAGNOSIS — L738 Other specified follicular disorders: Secondary | ICD-10-CM | POA: Diagnosis not present

## 2017-06-16 DIAGNOSIS — Z85828 Personal history of other malignant neoplasm of skin: Secondary | ICD-10-CM | POA: Diagnosis not present

## 2017-06-29 ENCOUNTER — Encounter: Payer: Self-pay | Admitting: Family Medicine

## 2017-06-29 ENCOUNTER — Ambulatory Visit (INDEPENDENT_AMBULATORY_CARE_PROVIDER_SITE_OTHER): Payer: Medicare Other | Admitting: Family Medicine

## 2017-06-29 VITALS — BP 122/80 | HR 70 | Temp 97.8°F | Ht 65.0 in | Wt 150.5 lb

## 2017-06-29 DIAGNOSIS — R197 Diarrhea, unspecified: Secondary | ICD-10-CM

## 2017-06-29 LAB — CBC WITH DIFFERENTIAL/PLATELET
BASOS PCT: 0.6 % (ref 0.0–3.0)
Basophils Absolute: 0 10*3/uL (ref 0.0–0.1)
Eosinophils Absolute: 0.2 10*3/uL (ref 0.0–0.7)
Eosinophils Relative: 3.5 % (ref 0.0–5.0)
HEMATOCRIT: 42.1 % (ref 36.0–46.0)
Hemoglobin: 14.3 g/dL (ref 12.0–15.0)
LYMPHS ABS: 1.5 10*3/uL (ref 0.7–4.0)
Lymphocytes Relative: 25.3 % (ref 12.0–46.0)
MCHC: 33.9 g/dL (ref 30.0–36.0)
MCV: 86.8 fl (ref 78.0–100.0)
MONOS PCT: 11.1 % (ref 3.0–12.0)
Monocytes Absolute: 0.6 10*3/uL (ref 0.1–1.0)
NEUTROS PCT: 59.5 % (ref 43.0–77.0)
Neutro Abs: 3.5 10*3/uL (ref 1.4–7.7)
Platelets: 243 10*3/uL (ref 150.0–400.0)
RBC: 4.86 Mil/uL (ref 3.87–5.11)
RDW: 13.9 % (ref 11.5–15.5)
WBC: 5.9 10*3/uL (ref 4.0–10.5)

## 2017-06-29 LAB — HEMOCCULT GUIAC POC 1CARD (OFFICE): Fecal Occult Blood, POC: NEGATIVE

## 2017-06-29 LAB — COMPREHENSIVE METABOLIC PANEL
ALK PHOS: 127 U/L — AB (ref 39–117)
ALT: 16 U/L (ref 0–35)
AST: 19 U/L (ref 0–37)
Albumin: 4.6 g/dL (ref 3.5–5.2)
BUN: 21 mg/dL (ref 6–23)
CALCIUM: 9.8 mg/dL (ref 8.4–10.5)
CO2: 30 meq/L (ref 19–32)
Chloride: 103 mEq/L (ref 96–112)
Creatinine, Ser: 0.76 mg/dL (ref 0.40–1.20)
GFR: 78.56 mL/min (ref >60.00)
GLUCOSE: 101 mg/dL — AB (ref 70–99)
POTASSIUM: 4 meq/L (ref 3.5–5.1)
Sodium: 141 mEq/L (ref 135–145)
Total Bilirubin: 0.6 mg/dL (ref 0.2–1.2)
Total Protein: 7.8 g/dL (ref 6.0–8.3)

## 2017-06-29 NOTE — Patient Instructions (Signed)
Labs today Stop magnesium, hold aricept for now, decrease sertraline to 1 tablet daily for now. Update [place] with effect. Use desitin barrier cream to perianal area. If ongoing, we may refer you to GI

## 2017-06-29 NOTE — Assessment & Plan Note (Addendum)
Hemoccult negative x1 in office.  Benign exam. Not consistent with diarrhea associated with fecal impaction.  ?med related - rec stop Magnesium, hold aricept x1 wk, decrease sertraline to 25mg  daily and update with effect after 1 wk.  ?lymphocytic colitis (on SSRI) Check CBC, CMP eval diverticulitis.  Consider GI referral if unrevealing workup.

## 2017-06-29 NOTE — Progress Notes (Addendum)
BP 122/80 (BP Location: Left Arm, Patient Position: Sitting, Cuff Size: Normal)   Pulse 70   Temp 97.8 F (36.6 C) (Oral)   Ht 5\' 5"  (1.651 m)   Wt 150 lb 8 oz (68.3 kg)   SpO2 95%   BMI 25.04 kg/m    CC: bowel frequency Subjective:    Patient ID: Kristin Kramer, female    DOB: 02-13-1941, 77 y.o.   MRN: [id number]  HPI: Kristin Kramer is a 77 y.o. female presenting on 06/29/2017 for GI Problem (Has frequent BMs that started about 1 mo ago. C/o multiple BMs daily. Has BMs with urination, sometimes really soft.  Thinks rectum is inflammed. Uses Preparation H. )   1 mo h/o increased bowel movement frequency with looser stools, sometimes watery. Long skinny bowel movement in the mornings. Stool incontinence with urination or with standing. No food triggers. Worse over last 5 days  Feels rectum is inflamed. Using preparation H.   No fevers/chills, abdominal pain, nausea/vomiting, blood in stool.  Some heat intolerance with daytime and night time sweating.  No new medicines. On aricept for several months. Taking magnesium for chronic constipation. On low dose sertraline. ?IBS.   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 Interim medical history since our last visit reviewed. Allergies and medications reviewed and updated. Outpatient Medications Prior to Visit  Medication Sig Dispense Refill  . acetaminophen (TYLENOL) 325 MG tablet Take 650 mg by mouth every 6 (six) hours as needed. For pain    . Biotin (BIOTIN 5000) 5 MG CAPS Take 5 mg by mouth daily.     . calcium carbonate (OS-CAL) 600 MG TABS tablet Take 1 tablet (600 mg total) by mouth daily with breakfast.    . Cholecalciferol (VITAMIN D3) 5000 units CAPS Take 1 capsule (5,000 Units total) by mouth daily.    . Coenzyme Q10 (COQ-10 PO) Take 900 mg by mouth daily.      Marland Kitchen donepezil (ARICEPT) 5 MG tablet Take 1 tablet (5 mg total) by mouth daily. 30 tablet 6  . loratadine (CLARITIN) 10 MG tablet Take 1 tablet (10 mg total) by mouth daily.    . magnesium gluconate (MAGONATE) 500 MG tablet Take 500 mg by mouth daily.    . metroNIDAZOLE (METROCREAM) 0.75 % cream Apply 1 application topically 2 (two) times daily. Applied daily to patient face    . montelukast (SINGULAIR) 10 MG tablet Take 1 tablet (10 mg total) by mouth at bedtime. 30 tablet 3  . niacinamide 500 MG tablet Take 500 mg by mouth daily. Per dermatology    . Omega-3 Fatty Acids (FISH OIL) 1000 MG CAPS Take 1,000 mg by mouth daily.     . Potassium 99 MG TABS Take 1 tablet by mouth daily.    . Probiotic Product (PROBIOTIC PO) Take 1 capsule by mouth daily.     . ranitidine (ZANTAC) 150 MG tablet Take 150 mg by mouth 2 (two) times daily as needed. For acid reflux/indigestion/GERD  12  . sertraline (ZOLOFT) 25 MG tablet ALTERNATE 1 TABLET AND 1.5 TABLETS EVERY OTHER DAY 120 tablet 3  . TURMERIC CURCUMIN PO Take 900 mg by mouth daily.     . vitamin B-12 (CYANOCOBALAMIN) 1000 MCG tablet Take 1,000 mcg by mouth daily.     No facility-administered medications prior  to visit.      Per HPI unless specifically indicated in ROS section below Review of Systems     Objective:    BP 122/80 (BP Location: Left Arm, Patient Position: Sitting, Cuff Size: Normal)   Pulse 70   Temp 97.8 F (36.6 C) (Oral)   Ht 5\' 5"  (1.651 m)   Wt 150 lb 8 oz (68.3 kg)   SpO2 95%   BMI 25.04 kg/m   Wt Readings from Last 3 Encounters:  06/29/17 150 lb 8 oz (68.3 kg)  04/19/17 154 lb (69.9 kg)  02/03/17 154 lb (69.9 kg)    Physical Exam  Constitutional: She appears well-developed and well-nourished. No distress.  HENT:  Mouth/Throat: Oropharynx is clear and moist. No oropharyngeal exudate.  Cardiovascular: Normal rate, regular rhythm and normal heart sounds.  No murmur heard. Pulmonary/Chest: Effort normal and breath  sounds normal. No respiratory distress. She has no wheezes. She has no rales.  Abdominal: Soft. Bowel sounds are normal. She exhibits no distension and no mass. There is tenderness (mild) in the right lower quadrant and suprapubic area. There is no rebound, no guarding and no CVA tenderness. No hernia.  Genitourinary: Rectum normal. Rectal exam shows no external hemorrhoid, no internal hemorrhoid, no fissure, no mass, no tenderness, anal tone normal and guaiac negative stool.  Genitourinary Comments: Raw skin at perianal region without obvious hemorrhoid No stool in rectal vault  Musculoskeletal: She exhibits no edema.  Skin: No rash noted.  Psychiatric: She has a normal mood and affect.  Nursing note and vitals reviewed.  Results for orders placed or performed in visit on 06/29/17  CBC with Differential/Platelet  Result Value Ref Range   WBC 5.9 4.0 - 10.5 K/uL   RBC 4.86 3.87 - 5.11 Mil/uL   Hemoglobin 14.3 12.0 - 15.0 g/dL   HCT 42.1 36.0 - 46.0 %   MCV 86.8 78.0 - 100.0 fl   MCHC 33.9 30.0 - 36.0 g/dL   RDW 13.9 11.5 - 15.5 %   Platelets 243.0 150.0 - 400.0 K/uL   Neutrophils Relative % 59.5 43.0 - 77.0 %   Lymphocytes Relative 25.3 12.0 - 46.0 %   Monocytes Relative 11.1 3.0 - 12.0 %   Eosinophils Relative 3.5 0.0 - 5.0 %   Basophils Relative 0.6 0.0 - 3.0 %   Neutro Abs 3.5 1.4 - 7.7 K/uL   Lymphs Abs 1.5 0.7 - 4.0 K/uL   Monocytes Absolute 0.6 0.1 - 1.0 K/uL   Eosinophils Absolute 0.2 0.0 - 0.7 K/uL   Basophils Absolute 0.0 0.0 - 0.1 K/uL  Comprehensive metabolic panel  Result Value Ref Range   Sodium 141 135 - 145 mEq/L   Potassium 4.0 3.5 - 5.1 mEq/L   Chloride 103 96 - 112 mEq/L   CO2 30 19 - 32 mEq/L   Glucose, Bld 101 (H) 70 - 99 mg/dL   BUN 21 6 - 23 mg/dL   Creatinine, Ser 0.76 0.40 - 1.20 mg/dL   Total Bilirubin 0.6 0.2 - 1.2 mg/dL   Alkaline Phosphatase 127 (H) 39 - 117 U/L   AST 19 0 - 37 U/L   ALT 16 0 - 35 U/L   Total Protein 7.8 6.0 - 8.3 g/dL    Albumin 4.6 3.5 - 5.2 g/dL   Calcium 9.8 8.4 - 10.5 mg/dL   GFR 78.56 >60.00 mL/min  Hemoccult - 1 Card (office)  Result Value Ref Range   Fecal Occult Blood, POC Negative Negative  Card #1 Date     Card #2 Fecal Occult Blod, POC     Card #2 Date     Card #3 Fecal Occult Blood, POC     Card #3 Date         Assessment & Plan:   Problem List Items Addressed This Visit    Diarrhea - Primary    Hemoccult negative x1 in office.  Benign exam. Not consistent with diarrhea associated with fecal impaction.  ?med related - rec stop Magnesium, hold aricept x1 wk, decrease sertraline to 25mg  daily and update with effect after 1 wk.  ?lymphocytic colitis (on SSRI) Check CBC, CMP eval diverticulitis.  Consider GI referral if unrevealing workup.       Relevant Orders   CBC with Differential/Platelet (Completed)   Comprehensive metabolic panel (Completed)   Hemoccult - 1 Card (office) (Completed)       No orders of the defined types were placed in this encounter.  Orders Placed This Encounter  Procedures  . CBC with Differential/Platelet  . Comprehensive metabolic panel  . Hemoccult - 1 Card (office)    Follow up plan: No follow-ups on file.  [name], MD

## 2017-07-05 ENCOUNTER — Ambulatory Visit
Admission: RE | Admit: 2017-07-05 | Discharge: 2017-07-05 | Disposition: A | Discharge status: Home / Self Care | Payer: Medicare Other | Source: Ambulatory Visit | Attending: Family Medicine | Admitting: Family Medicine

## 2017-07-05 ENCOUNTER — Encounter: Payer: Self-pay | Admitting: Family Medicine

## 2017-07-05 DIAGNOSIS — R197 Diarrhea, unspecified: Secondary | ICD-10-CM

## 2017-07-05 DIAGNOSIS — I880 Nonspecific mesenteric lymphadenitis: Secondary | ICD-10-CM

## 2017-07-05 DIAGNOSIS — R109 Unspecified abdominal pain: Secondary | ICD-10-CM

## 2017-07-05 DIAGNOSIS — R1030 Lower abdominal pain, unspecified: Secondary | ICD-10-CM

## 2017-07-05 MED ORDER — IOPAMIDOL (ISOVUE-300) INJECTION 61%
100.0000 mL | Freq: Once | INTRAVENOUS | Status: AC | PRN
  Administered 2017-07-05: 100 mL via INTRAVENOUS

## 2017-07-05 NOTE — Telephone Encounter (Signed)
CT scan referral placed for ongoing lower abd pain with diarrhea.

## 2017-07-06 ENCOUNTER — Other Ambulatory Visit: Payer: Self-pay | Admitting: Family Medicine

## 2017-07-06 DIAGNOSIS — K654 Sclerosing mesenteritis: Secondary | ICD-10-CM

## 2017-07-06 DIAGNOSIS — R197 Diarrhea, unspecified: Secondary | ICD-10-CM

## 2017-07-06 MED ORDER — PREDNISONE 20 MG PO TABS: ORAL_TABLET | ORAL | 0 refills | Status: DC

## 2017-07-06 NOTE — Assessment & Plan Note (Signed)
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

## 2017-07-07 ENCOUNTER — Emergency Department (HOSPITAL_COMMUNITY): Payer: Medicare Other

## 2017-07-07 ENCOUNTER — Inpatient Hospital Stay (HOSPITAL_COMMUNITY)
Admission: EM | Admit: 2017-07-07 | Discharge: 2017-07-10 | DRG: 481 | Disposition: A | Discharge status: Skilled Nursing Facility | Payer: Medicare Other | Attending: Internal Medicine | Admitting: Internal Medicine

## 2017-07-07 ENCOUNTER — Encounter (HOSPITAL_COMMUNITY): Payer: Self-pay | Admitting: Emergency Medicine

## 2017-07-07 DIAGNOSIS — Z96653 Presence of artificial knee joint, bilateral: Secondary | ICD-10-CM | POA: Diagnosis not present

## 2017-07-07 DIAGNOSIS — W11XXXA Fall on and from ladder, initial encounter: Secondary | ICD-10-CM | POA: Diagnosis present

## 2017-07-07 DIAGNOSIS — K654 Sclerosing mesenteritis: Secondary | ICD-10-CM | POA: Diagnosis not present

## 2017-07-07 DIAGNOSIS — J302 Other seasonal allergic rhinitis: Secondary | ICD-10-CM | POA: Diagnosis not present

## 2017-07-07 DIAGNOSIS — F0390 Unspecified dementia without behavioral disturbance: Secondary | ICD-10-CM | POA: Diagnosis present

## 2017-07-07 DIAGNOSIS — S72001S Fracture of unspecified part of neck of right femur, sequela: Secondary | ICD-10-CM | POA: Diagnosis not present

## 2017-07-07 DIAGNOSIS — S72001A Fracture of unspecified part of neck of right femur, initial encounter for closed fracture: Secondary | ICD-10-CM

## 2017-07-07 DIAGNOSIS — Z9109 Other allergy status, other than to drugs and biological substances: Secondary | ICD-10-CM | POA: Diagnosis not present

## 2017-07-07 DIAGNOSIS — Y92008 Other place in unspecified non-institutional (private) residence as the place of occurrence of the external cause: Secondary | ICD-10-CM | POA: Diagnosis not present

## 2017-07-07 DIAGNOSIS — R262 Difficulty in walking, not elsewhere classified: Secondary | ICD-10-CM | POA: Diagnosis not present

## 2017-07-07 DIAGNOSIS — Z22321 Carrier or suspected carrier of Methicillin susceptible Staphylococcus aureus: Secondary | ICD-10-CM | POA: Diagnosis not present

## 2017-07-07 DIAGNOSIS — S72141A Displaced intertrochanteric fracture of right femur, initial encounter for closed fracture: Secondary | ICD-10-CM | POA: Diagnosis not present

## 2017-07-07 DIAGNOSIS — Z85828 Personal history of other malignant neoplasm of skin: Secondary | ICD-10-CM | POA: Diagnosis not present

## 2017-07-07 DIAGNOSIS — Z66 Do not resuscitate: Secondary | ICD-10-CM | POA: Diagnosis present

## 2017-07-07 DIAGNOSIS — R413 Other amnesia: Secondary | ICD-10-CM | POA: Diagnosis not present

## 2017-07-07 DIAGNOSIS — M8580 Other specified disorders of bone density and structure, unspecified site: Secondary | ICD-10-CM | POA: Diagnosis not present

## 2017-07-07 DIAGNOSIS — E785 Hyperlipidemia, unspecified: Secondary | ICD-10-CM | POA: Diagnosis not present

## 2017-07-07 DIAGNOSIS — G43909 Migraine, unspecified, not intractable, without status migrainosus: Secondary | ICD-10-CM | POA: Diagnosis not present

## 2017-07-07 DIAGNOSIS — F331 Major depressive disorder, recurrent, moderate: Secondary | ICD-10-CM | POA: Diagnosis not present

## 2017-07-07 DIAGNOSIS — T148XXA Other injury of unspecified body region, initial encounter: Secondary | ICD-10-CM | POA: Diagnosis not present

## 2017-07-07 DIAGNOSIS — E559 Vitamin D deficiency, unspecified: Secondary | ICD-10-CM | POA: Diagnosis not present

## 2017-07-07 DIAGNOSIS — F329 Major depressive disorder, single episode, unspecified: Secondary | ICD-10-CM | POA: Diagnosis present

## 2017-07-07 DIAGNOSIS — M6281 Muscle weakness (generalized): Secondary | ICD-10-CM | POA: Diagnosis not present

## 2017-07-07 DIAGNOSIS — Z9841 Cataract extraction status, right eye: Secondary | ICD-10-CM | POA: Diagnosis not present

## 2017-07-07 DIAGNOSIS — M25559 Pain in unspecified hip: Secondary | ICD-10-CM | POA: Diagnosis not present

## 2017-07-07 DIAGNOSIS — S72009A Fracture of unspecified part of neck of unspecified femur, initial encounter for closed fracture: Secondary | ICD-10-CM

## 2017-07-07 DIAGNOSIS — Z9181 History of falling: Secondary | ICD-10-CM | POA: Diagnosis not present

## 2017-07-07 DIAGNOSIS — Z79899 Other long term (current) drug therapy: Secondary | ICD-10-CM | POA: Diagnosis not present

## 2017-07-07 DIAGNOSIS — F0280 Dementia in other diseases classified elsewhere without behavioral disturbance: Secondary | ICD-10-CM | POA: Diagnosis not present

## 2017-07-07 DIAGNOSIS — G309 Alzheimer's disease, unspecified: Secondary | ICD-10-CM | POA: Diagnosis not present

## 2017-07-07 DIAGNOSIS — M810 Age-related osteoporosis without current pathological fracture: Secondary | ICD-10-CM | POA: Diagnosis not present

## 2017-07-07 DIAGNOSIS — M25551 Pain in right hip: Secondary | ICD-10-CM | POA: Diagnosis not present

## 2017-07-07 DIAGNOSIS — Z90710 Acquired absence of both cervix and uterus: Secondary | ICD-10-CM

## 2017-07-07 DIAGNOSIS — M50923 Unspecified cervical disc disorder at C6-C7 level: Secondary | ICD-10-CM | POA: Diagnosis not present

## 2017-07-07 DIAGNOSIS — S72141D Displaced intertrochanteric fracture of right femur, subsequent encounter for closed fracture with routine healing: Secondary | ICD-10-CM | POA: Diagnosis not present

## 2017-07-07 DIAGNOSIS — J309 Allergic rhinitis, unspecified: Secondary | ICD-10-CM | POA: Diagnosis not present

## 2017-07-07 DIAGNOSIS — S299XXA Unspecified injury of thorax, initial encounter: Secondary | ICD-10-CM | POA: Diagnosis not present

## 2017-07-07 DIAGNOSIS — Z419 Encounter for procedure for purposes other than remedying health state, unspecified: Secondary | ICD-10-CM

## 2017-07-07 DIAGNOSIS — S72143A Displaced intertrochanteric fracture of unspecified femur, initial encounter for closed fracture: Secondary | ICD-10-CM | POA: Insufficient documentation

## 2017-07-07 HISTORY — DX: Fracture of unspecified part of neck of right femur, initial encounter for closed fracture: S72.001A

## 2017-07-07 HISTORY — DX: Fracture of unspecified part of neck of unspecified femur, initial encounter for closed fracture: S72.009A

## 2017-07-07 LAB — CBC WITH DIFFERENTIAL/PLATELET
Basophils Absolute: 0 10*3/uL (ref 0.0–0.1)
Basophils Relative: 0 %
Eosinophils Absolute: 0 10*3/uL (ref 0.0–0.7)
Eosinophils Relative: 0 %
HCT: 40.9 % (ref 36.0–46.0)
Hemoglobin: 13.3 g/dL (ref 12.0–15.0)
Lymphocytes Relative: 8 %
Lymphs Abs: 1.1 10*3/uL (ref 0.7–4.0)
MCH: 28.9 pg (ref 26.0–34.0)
MCHC: 32.5 g/dL (ref 30.0–36.0)
MCV: 88.9 fL (ref 78.0–100.0)
Monocytes Absolute: 0.7 10*3/uL (ref 0.1–1.0)
Monocytes Relative: 5 %
Neutro Abs: 11.8 10*3/uL — ABNORMAL HIGH (ref 1.7–7.7)
Neutrophils Relative %: 87 %
Platelets: 255 10*3/uL (ref 150–400)
RBC: 4.6 MIL/uL (ref 3.87–5.11)
RDW: 13.2 % (ref 11.5–15.5)
WBC: 13.7 10*3/uL — ABNORMAL HIGH (ref 4.0–10.5)

## 2017-07-07 LAB — BASIC METABOLIC PANEL
Anion gap: 14 (ref 5–15)
BUN: 20 mg/dL (ref 6–20)
CO2: 22 mmol/L (ref 22–32)
Calcium: 9.3 mg/dL (ref 8.9–10.3)
Chloride: 104 mmol/L (ref 101–111)
Creatinine, Ser: 0.83 mg/dL (ref 0.44–1.00)
GFR calc Af Amer: >60 mL/min (ref >60)
GFR calc non Af Amer: >60 mL/min (ref >60)
Glucose, Bld: 111 mg/dL — ABNORMAL HIGH (ref 65–99)
Potassium: 3.6 mmol/L (ref 3.5–5.1)
Sodium: 140 mmol/L (ref 135–145)

## 2017-07-07 LAB — PROTIME-INR
INR: 1.06
PROTHROMBIN TIME: 13.7 s (ref 11.4–15.2)

## 2017-07-07 MED ORDER — SODIUM CHLORIDE 0.9 % IV SOLN
INTRAVENOUS | Status: DC
  Administered 2017-07-07: 18:00:00 via INTRAVENOUS

## 2017-07-07 MED ORDER — LORAZEPAM 2 MG/ML IJ SOLN
0.5000 mg | Freq: Once | INTRAMUSCULAR | Status: AC
  Administered 2017-07-07: 0.5 mg via INTRAVENOUS
  Filled 2017-07-07: qty 1

## 2017-07-07 MED ORDER — DEXTROSE-NACL 5-0.45 % IV SOLN
INTRAVENOUS | Status: DC
  Administered 2017-07-08: 100 mL/h via INTRAVENOUS

## 2017-07-07 MED ORDER — HYDROMORPHONE HCL 2 MG/ML IJ SOLN
1.0000 mg | Freq: Once | INTRAMUSCULAR | Status: AC
Start: 2017-07-07 — End: 2017-07-07
  Administered 2017-07-07: 1 mg via INTRAVENOUS
  Filled 2017-07-07: qty 1

## 2017-07-07 MED ORDER — MORPHINE SULFATE (PF) 2 MG/ML IV SOLN
2.0000 mg | INTRAVENOUS | Status: DC | PRN
  Administered 2017-07-07 – 2017-07-08 (×2): 2 mg via INTRAVENOUS
  Filled 2017-07-07 (×2): qty 1

## 2017-07-07 MED ORDER — CEFAZOLIN SODIUM-DEXTROSE 2-4 GM/100ML-% IV SOLN
2.0000 g | Freq: Once | INTRAVENOUS | Status: AC
  Administered 2017-07-08: 2 g via INTRAVENOUS
  Filled 2017-07-07 (×2): qty 100

## 2017-07-07 MED ORDER — SERTRALINE HCL 25 MG PO TABS
37.5000 mg | ORAL_TABLET | ORAL | Status: DC
  Administered 2017-07-08 – 2017-07-10 (×2): 37.5 mg via ORAL
  Filled 2017-07-07 (×2): qty 2

## 2017-07-07 MED ORDER — SERTRALINE HCL 25 MG PO TABS
25.0000 mg | ORAL_TABLET | ORAL | Status: DC
  Administered 2017-07-07 – 2017-07-09 (×2): 25 mg via ORAL
  Filled 2017-07-07 (×2): qty 1

## 2017-07-07 MED ORDER — HYDROMORPHONE HCL 2 MG/ML IJ SOLN
1.0000 mg | Freq: Once | INTRAMUSCULAR | Status: AC
  Administered 2017-07-07: 1 mg via INTRAVENOUS
  Filled 2017-07-07: qty 1

## 2017-07-07 MED ORDER — SERTRALINE HCL 50 MG PO TABS
50.0000 mg | ORAL_TABLET | Freq: Every day | ORAL | Status: DC
Start: 2017-07-07 — End: 2017-07-07

## 2017-07-07 MED ORDER — MUPIROCIN 2 % EX OINT
1.0000 "application " | TOPICAL_OINTMENT | Freq: Two times a day (BID) | CUTANEOUS | Status: AC
  Administered 2017-07-08 – 2017-07-09 (×4): 1 via NASAL
  Filled 2017-07-07 (×2): qty 22

## 2017-07-07 MED ORDER — DONEPEZIL HCL 5 MG PO TABS
5.0000 mg | ORAL_TABLET | Freq: Every day | ORAL | Status: DC
  Administered 2017-07-07 – 2017-07-09 (×3): 5 mg via ORAL
  Filled 2017-07-07 (×3): qty 1

## 2017-07-07 MED ORDER — KETOROLAC TROMETHAMINE 15 MG/ML IJ SOLN
15.0000 mg | Freq: Once | INTRAMUSCULAR | Status: AC
  Administered 2017-07-07: 15 mg via INTRAVENOUS
  Filled 2017-07-07: qty 1

## 2017-07-07 MED ORDER — LORATADINE 10 MG PO TABS
10.0000 mg | ORAL_TABLET | Freq: Every day | ORAL | Status: DC
  Administered 2017-07-08 – 2017-07-10 (×3): 10 mg via ORAL
  Filled 2017-07-07 (×3): qty 1

## 2017-07-07 NOTE — ED Notes (Signed)
Pt returned from x-ray  Requesting more pain meds.

## 2017-07-07 NOTE — ED Notes (Signed)
Pt is Jehovah witness, donot collect type and screen.

## 2017-07-07 NOTE — Progress Notes (Signed)
Patient arrived from [facility] to [id number]. MD notified per order.

## 2017-07-07 NOTE — ED Notes (Signed)
Pt in x-ray at this time

## 2017-07-07 NOTE — ED Triage Notes (Signed)
Per EMS, pt from home, fell from step ladder landing on right hip. Shortening noted. Denies LOC, no dizziness. C/o lower back pain. c-collar in place on arrival. GIven 259mcg fentanyl PTA.

## 2017-07-07 NOTE — ED Provider Notes (Signed)
[facility]ERG[facility] DEPARTMENT Provider Note   CSN: [id number] Arrival date & time: 07/07/17  [place]     History   Chief Complaint Chief Complaint  Patient presents with  . Fall    HPI Kristin Kramer is a 77 y.o. female.  HPI   77 year old female with right hip pain.  She had a mechanical fall just prior to arrival.  She fell from the second or third rung of a ladder while cleaning her screened porch onto a concrete surface.  Developed her right side.  She has had severe persistent right hip pain since then.  She did not hit her head.  She denies any significant headaches or neck pain.  No acute numbness or tingling.  She was unable to bear weight secondary to severe pain.  EMS placed an IV prehospital and she received 200 mcg of fentanyl.  Prior bilateral knee replacements by Dr. Para March.  Past Medical History:  Diagnosis Date  . Abdominal pain, other specified site 10.2014   [facility] ER - mesenteric adenitis vs sclerosing mesenteritis vs nonspecific lymphadenitis  . Abnormal ECG    a. left axis deviation;  b. 12/2011 Echo: EF 55-60%, no rwma, pasp 45mmHg.  . Arthritis   . Depression    prior on lexapro, celexa, wellbutrin.  high cymbalta doses cause tremors  . H/O seasonal allergies   . History of pneumonia   . History of UTI    12/2011 - Treatment began preoperatively with CIPRO 500mg  BID  . HLD (hyperlipidemia)    statin intolerance  . Knee osteoarthritis 2013, 2014   bilateral s/p B TKR Noemi Chapel)  . Knee osteoarthritis   . 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 Migraines   . Osteopenia 08/2016   hip -2.4, spine -1.4  . Post-operative nausea and vomiting   . Refusal of blood transfusions as patient is Jehovah's Witness   . Seasonal allergies    cats, dust, mold, roaches  . 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  Isac Sarna), R tibia - then recurrent  . Vitamin D deficiency     Patient Active Problem List   Diagnosis Date Noted  . Chronic nasal congestion 04/19/2017  . Exertional dyspnea 04/19/2017  . Memory deficit 08/05/2016  . 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 Advanced care planning/counseling discussion 07/31/2014  . Diarrhea 07/31/2014  . Herpes zoster 02/19/2014  . Allergic rhinitis 04/06/2013  . 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  . Post-operative nausea and vomiting   . Refusal of blood transfusions as patient is Jehovah's Witness   . Medicare annual wellness visit, subsequent 01/14/2013  . Transaminitis 01/03/2013  . 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 Osteopenia   . HLD (hyperlipidemia)   . Vitamin D deficiency   . Anxiety 01/05/2012  . Seasonal allergies   . Squamous cell skin cancer   . MDD (major depressive disorder), recurrent episode, moderate ([place])   . Migraines   . Abnormal EKG   . Arthritis     Past Surgical History:  Procedure Laterality Date  . BREAST ENHANCEMENT SURGERY    . BUNIONECTOMY  2013   R and L foot  . CATARACT EXTRACTION  2013   R, pending L  . COLONOSCOPY  2007   no records received  .  COLONOSCOPY  08/2014   tubular adenoma, rpt 5 yrs Fuller Plan)  . COSMETIC SURGERY  2002   face lift  . dexa  06/2008   T -1.4 spine and hip  . ETHMOIDECTOMY Bilateral 05/19/2016   Procedure: ETHMOIDECTOMY;  Surgeon: Carloyn Manner, MD;  Location: [facility] ORS;  Service: ENT;  Laterality: Bilateral;  . EYE SURGERY     bilateral cataract surgery  . FRONTAL SINUS EXPLORATION Bilateral 05/19/2016   Procedure: FRONTAL SINUS EXPLORATION;  Surgeon: Carloyn Manner, MD;  Location: [facility] ORS;  Service: ENT;  Laterality: Bilateral;  . JOINT REPLACEMENT Bilateral 2015   knees  . MAXILLARY ANTROSTOMY Bilateral 05/19/2016   Procedure: MAXILLARY ANTROSTOMY;  Surgeon: Carloyn Manner,  MD;  Location: [facility] ORS;  Service: ENT;  Laterality: Bilateral;  . MOHS SURGERY  2011   R leg  . PARTIAL HYSTERECTOMY  1980   fibroids, ovaries remained  . SINUS ENDO W/FUSION N/A 05/19/2016   Procedure: ENDOSCOPIC SINUS SURGERY WITH NAVIGATION;  Surgeon: Carloyn Manner, MD;  Location: [facility] ORS;  Service: ENT;  Laterality: N/A;  . SPHENOIDECTOMY Bilateral 05/19/2016   Procedure: Coralee Pesa;  Surgeon: Carloyn Manner, MD;  Location: [facility] ORS;  Service: ENT;  Laterality: Bilateral;  . SQUAMOUS CELL CARCINOMA EXCISION Right 07/13/2016   Dr. Rolm Bookbinder, [facility]  . TONSILLECTOMY AND ADENOIDECTOMY  1945  . TOTAL KNEE ARTHROPLASTY  01/02/2012   Right - Surgeon: Lorn Junes, MD  . TOTAL KNEE ARTHROPLASTY Left 03/18/2013   Surgeon: Lorn Junes, MD     OB History   None      Home Medications    Prior to Admission medications   Medication Sig Start Date End Date Taking? Authorizing Provider  acetaminophen (TYLENOL) 325 MG tablet Take 650 mg by mouth every 6 (six) hours as needed. For pain    [provider]  Biotin (BIOTIN 5000) 5 MG CAPS Take 5 mg by mouth daily.     [provider]  calcium carbonate (OS-CAL) 600 MG TABS tablet Take 1 tablet (600 mg total) by mouth daily with breakfast. 08/04/15   [name], MD  Cholecalciferol (VITAMIN D3) 5000 units CAPS Take 1 capsule (5,000 Units total) by mouth daily. 08/08/15   [name], MD  Coenzyme Q10 (COQ-10 PO) Take 900 mg by mouth daily.     [provider]  donepezil (ARICEPT) 5 MG tablet Take 1 tablet (5 mg total) by mouth daily. 04/19/17   [name], MD  loratadine (CLARITIN) 10 MG tablet Take 1 tablet (10 mg total) by mouth daily. 04/19/17   [name], MD  metroNIDAZOLE (METROCREAM) 0.75 % cream Apply 1 application topically 2 (two) times daily. Applied daily to patient face 04/08/16   [provider]  montelukast (SINGULAIR) 10 MG tablet Take 1 tablet  (10 mg total) by mouth at bedtime. 04/19/17   [name], MD  niacinamide 500 MG tablet Take 500 mg by mouth daily. Per dermatology    [provider]  Omega-3 Fatty Acids (FISH OIL) 1000 MG CAPS Take 1,000 mg by mouth daily.     [provider]  Potassium 99 MG TABS Take 1 tablet by mouth daily.    [provider]  predniSONE (DELTASONE) 20 MG tablet Take two tablets daily for 5 days followed by one tablet daily for 5 days 07/06/17   [name], MD  Probiotic Product (PROBIOTIC PO) Take 1 capsule by mouth daily.     [provider]  ranitidine (ZANTAC) 150  MG tablet Take 150 mg by mouth 2 (two) times daily as needed. For acid reflux/indigestion/GERD 03/03/16   [provider]  sertraline (ZOLOFT) 25 MG tablet ALTERNATE 1 TABLET AND 1.5 TABLETS EVERY OTHER DAY 02/22/17   [name], MD  TURMERIC CURCUMIN PO Take 900 mg by mouth daily.     [provider]  vitamin B-12 (CYANOCOBALAMIN) 1000 MCG tablet Take 1,000 mcg by mouth daily.    [provider]    Family History Family History  Problem Relation Age of Onset  . Cancer Mother 36       lung, smoker  . Dementia Mother 64  . Cancer Father        lung, smoker  . Cancer Maternal Grandmother 57       leukemia  . Coronary artery disease Paternal Grandmother 23       sudden cardiac death  . Diabetes Neg Hx   . Stroke Neg Hx     Social History Social History   Tobacco Use  . Smoking status: Never Smoker  . Smokeless tobacco: Never Used  Substance Use Topics  . Alcohol use: Yes    Alcohol/week: 0.0 oz    Comment: occasional // 5-6 drinks per month  . Drug use: No     Allergies   Lipitor [atorvastatin] and Pravastatin   Review of Systems Review of Systems   Physical Exam Updated Vital Signs BP (!) 152/78 (BP Location: Right Arm)   Pulse 66   Resp 16   SpO2 96%   Physical Exam  Constitutional: She appears well-developed and  well-nourished. No distress.  HENT:  Head: Normocephalic and atraumatic.  Eyes: Conjunctivae are normal. Right eye exhibits no discharge. Left eye exhibits no discharge.  Neck: Neck supple.  Cardiovascular: Normal rate, regular rhythm and normal heart sounds. Exam reveals no gallop and no friction rub.  No murmur heard. Pulmonary/Chest: Effort normal and breath sounds normal. No respiratory distress.  Abdominal: Soft. She exhibits no distension. There is no tenderness.  Musculoskeletal: She exhibits no edema or tenderness.  Right lower extremity is held slightly flexed at the hip and knee.  Exquisite pain at the hip with attempted range of motion.  Pelvis is stable.  No midline spinal tenderness.  Bilateral anterior surgical scars to knees.  Palpable DP pulse.  Sensation is intact to light touch.  She is able to plantar and dorsiflex the right foot.  Neurological: She is alert.  Skin: Skin is warm and dry.  Psychiatric: She has a normal mood and affect. Her behavior is normal. Thought content normal.  Nursing note and vitals reviewed.    ED Treatments / Results  Labs (all labs ordered are listed, but only abnormal results are displayed) Labs Reviewed  CBC WITH DIFFERENTIAL/PLATELET - Abnormal; Notable for the following components:      Result Value   WBC 13.7 (*)    Neutro Abs 11.8 (*)    All other components within normal limits  BASIC METABOLIC PANEL - Abnormal; Notable for the following components:   Glucose, Bld 111 (*)    All other components within normal limits    EKG EKG Interpretation  Date/Time:  Friday Jul 07 2017 17:03:47 EDT Ventricular Rate:  68 PR Interval:    QRS Duration: 95 QT Interval:  428 QTC Calculation: 456 R Axis:   -75 Text Interpretation:  Sinus arrhythmia Consider left atrial enlargement Left anterior fascicular block Consider right ventricular hypertrophy Confirmed by Virgel Manifold [phone]) on 07/07/2017  5:42:34 PM   Radiology Dg Chest 1  View  Result Date: 07/07/2017 CLINICAL DATA:  Fall from ladder. Right hip fracture. Right hip pain. EXAM: CHEST  1 VIEW COMPARISON:  Two-view chest x-ray 05/06/2016 FINDINGS: The heart size is normal. Lung volumes are low. Lungs are clear. There is no edema or effusion. The visualized soft tissues and bony thorax are unremarkable. IMPRESSION: 1. Low lung volumes. 2. No acute cardiopulmonary disease. Electronically Signed   By: San Morelle M.D.   On: 07/07/2017 18:56   Dg Hip Unilat W Or Wo Pelvis 2-3 Views Right  Result Date: 07/07/2017 CLINICAL DATA:  Fall from ladder 3-4 feet.  Right hip pain. EXAM: DG HIP (WITH OR WITHOUT PELVIS) 2-3V RIGHT COMPARISON:  CT of the abdomen and pelvis 07/05/2017. FINDINGS: A right intratrochanteric fracture is present. There is displacement medially of the lesser trochanter. Femoral head is located. Visualized pelvis is intact.  Proximal left femur is unremarkable. IMPRESSION: Intertrochanteric fracture of the proximal right femur with mild displacement. Electronically Signed   By: San Morelle M.D.   On: 07/07/2017 18:54    Procedures Procedures (including critical care time)  Medications Ordered in ED Medications  HYDROmorphone (DILAUDID) injection 1 mg (has no administration in time range)  0.9 %  sodium chloride infusion (has no administration in time range)     Initial Impression / Assessment and Plan / ED Course  I have reviewed the triage vital signs and the nursing notes.  Pertinent labs & imaging results that were available during my care of the patient were reviewed by me and considered in my medical decision making (see chart for details).    Family right 78 year old female with severe right hip pain after mechanical fall.  Likely right hip fracture.  Pain medicine.  Will image.  N.p.o. for now.  Basic labs and EKG basic preop testing.  Low suspicion for other serious traumatic injury.  Imaging significant for intertrochanteric  fx. Pt requesting [facility]. Discussed with Dr French Ana and he recommended Dr Erlinda Hong. Discussed with Dr Erlinda Hong and he is planning on operating someone tomorrow. Pt updated. Will admit to medicine service. NPO after midnight.   Final Clinical Impressions(s) / ED Diagnoses   Final diagnoses:  Closed fracture of right hip, initial encounter [facility])    ED Discharge Orders    None       Virgel Manifold, MD 07/07/17 2018

## 2017-07-07 NOTE — H&P (Signed)
History and Physical    Kristin Kramer IOM:[id number] DOB: 10-Feb-1941 DOA: 07/07/2017  PCP: [name], MD  Patient coming from: home   Chief Complaint: fall  HPI: Kristin Kramer is a 76 y.o. female with medical history significant for possible dementia, depression, presenting after mechanical fall at home.  Was changing window screens this afternoon when slipped and fell. Landed on buttock. Did not hit head or anything else, no LOC, no laceration. Pain and difficulty immediately after, presented to this ED. No numbness.  Saw pcp last week for one month intermittent mild abdominal pain and loose stools. CT showed sclerosing mesenteritis. Has gi f/u scheduled. No abd pain today, says stool symptoms have improved.  ED Course: imaging, ortho consult Erlinda Hong)  Review of Systems: As per HPI otherwise 10 point review of systems negative.    Past Medical History:  Diagnosis Date  . Abdominal pain, other specified site 10.2014   [facility] ER - mesenteric adenitis vs sclerosing mesenteritis vs nonspecific lymphadenitis  . Abnormal ECG    a. left axis deviation;  b. 12/2011 Echo: EF 55-60%, no rwma, pasp 39mmHg.  . Arthritis   . Depression    prior on lexapro, celexa, wellbutrin.  high cymbalta doses cause tremors  . H/O seasonal allergies   . History of pneumonia   . History of UTI    12/2011 - Treatment began preoperatively with CIPRO 500mg  BID  . HLD (hyperlipidemia)    statin intolerance  . Knee osteoarthritis 2013, 2014   bilateral s/p B TKR Noemi [facility])  . Knee osteoarthritis   . 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 Migraines   . Osteopenia 08/2016   hip -2.4, spine -1.4  . Post-operative nausea and vomiting   . Refusal of blood transfusions as patient is Jehovah's Witness   . Seasonal allergies    cats, dust, mold, roaches  . 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 Isac Sarna), R tibia - then recurrent  . Vitamin D deficiency     Past Surgical History:  Procedure Laterality Date  . BREAST ENHANCEMENT SURGERY    . BUNIONECTOMY  2013   R and L foot  . CATARACT EXTRACTION  2013   R, pending L  . COLONOSCOPY  2007   no records received  . COLONOSCOPY  08/2014   tubular adenoma, rpt 5 yrs [facility] Plan)  . COSMETIC SURGERY  2002   face lift  . dexa  06/2008   T -1.4 spine and hip  . ETHMOIDECTOMY Bilateral 05/19/2016   Procedure: ETHMOIDECTOMY;  Surgeon: Carloyn Manner, MD;  Location: [facility] ORS;  Service: ENT;  Laterality: Bilateral;  . EYE SURGERY     bilateral cataract surgery  . FRONTAL SINUS EXPLORATION Bilateral 05/19/2016   Procedure: FRONTAL SINUS EXPLORATION;  Surgeon: Carloyn Manner, MD;  Location: [facility] ORS;  Service: ENT;  Laterality: Bilateral;  . JOINT REPLACEMENT Bilateral 2015   knees  . MAXILLARY ANTROSTOMY Bilateral 05/19/2016   Procedure: MAXILLARY ANTROSTOMY;  Surgeon: Carloyn Manner, MD;  Location: [facility] ORS;  Service: ENT;  Laterality: Bilateral;  . MOHS SURGERY  2011   R leg  . PARTIAL HYSTERECTOMY  1980   fibroids, ovaries remained  . SINUS ENDO W/FUSION N/A 05/19/2016   Procedure: ENDOSCOPIC SINUS SURGERY WITH NAVIGATION;  Surgeon: Carloyn Manner, MD;  Location: [facility] ORS;  Service: ENT;  Laterality:  N/A;  . SPHENOIDECTOMY Bilateral 05/19/2016   Procedure: SPHENOIDECTOMY;  Surgeon: Carloyn Manner, MD;  Location: [facility] ORS;  Service: ENT;  Laterality: Bilateral;  . SQUAMOUS CELL CARCINOMA EXCISION Right 07/13/2016   Dr. Rolm Bookbinder, [facility]  . TONSILLECTOMY AND ADENOIDECTOMY  1945  . TOTAL KNEE ARTHROPLASTY  01/02/2012   Right - Surgeon: Lorn Junes, MD  . TOTAL KNEE ARTHROPLASTY Left 03/18/2013   Surgeon: Lorn Junes, MD     reports that she has never smoked. She has never used smokeless tobacco. She reports that she drinks alcohol. She reports that she does not use  drugs.  Allergies  Allergen Reactions  . Lipitor [Atorvastatin] Other (See Comments)    Cramps  . Pravastatin Other (See Comments)    Cramps     Family History  Problem Relation Age of Onset  . Cancer Mother 56       lung, smoker  . Dementia Mother 33  . Cancer Father        lung, smoker  . Cancer Maternal Grandmother 6       leukemia  . Coronary artery disease Paternal Grandmother 57       sudden cardiac death  . Diabetes Neg Hx   . Stroke Neg Hx     Prior to Admission medications   Medication Sig Start Date End Date Taking? Authorizing Provider  acetaminophen (TYLENOL) 325 MG tablet Take 650 mg by mouth every 6 (six) hours as needed. For pain   Yes [provider]  Biotin (BIOTIN 5000) 5 MG CAPS Take 5 mg by mouth daily.    Yes [provider]  Cholecalciferol (VITAMIN D3) 5000 units CAPS Take 1 capsule (5,000 Units total) by mouth daily. 08/08/15  Yes [name], MD  Coenzyme Q10 (COQ-10 PO) Take 900 mg by mouth daily.    Yes [provider]  donepezil (ARICEPT) 5 MG tablet Take 1 tablet (5 mg total) by mouth daily. Patient taking differently: Take 5 mg by mouth at bedtime.  04/19/17  Yes [name], MD  loratadine (CLARITIN) 10 MG tablet Take 1 tablet (10 mg total) by mouth daily. 04/19/17  Yes [name], MD  metroNIDAZOLE (METROCREAM) 0.75 % cream Apply 1 application topically 2 (two) times daily. Applied daily to patient face 04/08/16  Yes [provider]  montelukast (SINGULAIR) 10 MG tablet Take 1 tablet (10 mg total) by mouth at bedtime. 04/19/17  Yes [name], MD  niacinamide 500 MG tablet Take 500 mg by mouth daily. Per dermatology   Yes [provider]  Omega-3 Fatty Acids (FISH OIL) 1000 MG CAPS Take 1,000 mg by mouth daily.    Yes [provider]  Potassium 99 MG TABS Take 1 tablet by mouth daily.   Yes [provider]  Probiotic Product (PROBIOTIC PO) Take 1 capsule by mouth  daily.    Yes [provider]  ranitidine (ZANTAC) 150 MG tablet Take 150 mg by mouth 2 (two) times daily as needed. For acid reflux/indigestion/GERD 03/03/16  Yes [provider]  sertraline (ZOLOFT) 25 MG tablet ALTERNATE 1 TABLET AND 1.5 TABLETS EVERY OTHER DAY Patient taking differently: 1 TABLET EVERY  at bedtime 02/22/17  Yes [name], MD  TURMERIC CURCUMIN PO Take 900 mg by mouth daily.    Yes [provider]  vitamin B-12 (CYANOCOBALAMIN) 1000 MCG tablet Take 1,000 mcg by mouth daily.   Yes [provider]  calcium carbonate (OS-CAL) 600 MG TABS tablet Take  1 tablet (600 mg total) by mouth daily with breakfast. Patient not taking: Reported on 07/07/2017 08/04/15   [name], MD  predniSONE (DELTASONE) 20 MG tablet Take two tablets daily for 5 days followed by one tablet daily for 5 days Patient not taking: Reported on 07/07/2017 07/06/17   [name], MD    Physical Exam: Vitals:   07/07/17 1715 07/07/17 1730 07/07/17 1845 07/07/17 1900  BP: (!) 141/74 (!) 155/75 (!) 146/82 126/71  Pulse: 74 71 63 72  Resp: 12 19 14 10   SpO2: 95% 96% (!) 88% 94%    Constitutional: No acute distress Head: Atraumatic Eyes: Conjunctiva clear ENM: Moist mucous membranes. Normal dentition.  Neck: Supple Respiratory: Clear to auscultation bilaterally, no wheezing/rales/rhonchi. Normal respiratory effort. No accessory muscle use. . Cardiovascular: Regular rate and rhythm. No murmurs/rubs/gallops. Abdomen: Non-tender, non-distended. No masses. No rebound or guarding. Positive bowel sounds. Musculoskeletal: right leg externally rotated Skin: No rashes, lesions, or ulcers.  Extremities: No peripheral edema. Palpable peripheral pulses.  Neurologic: Alert, moving all 4 extremities. Distal sensation LEs intact. Psychiatric: Normal insight and judgement.   Labs on Admission: I have personally reviewed following labs and imaging studies  CBC: Recent  Labs  Lab 07/07/17 1849  WBC 13.7*  NEUTROABS 11.8*  HGB 13.3  HCT 40.9  MCV 88.9  PLT 174   Basic Metabolic Panel: Recent Labs  Lab 07/07/17 1849  NA 140  K 3.6  CL 104  CO2 22  GLUCOSE 111*  BUN 20  CREATININE 0.83  CALCIUM 9.3   GFR: Estimated Creatinine Clearance: 51.9 mL/min (by C-G formula based on SCr of 0.83 mg/dL). Liver Function Tests: No results for input(s): AST, ALT, ALKPHOS, BILITOT, PROT, ALBUMIN in the last 168 hours. No results for input(s): LIPASE, AMYLASE in the last 168 hours. No results for input(s): AMMONIA in the last 168 hours. Coagulation Profile: No results for input(s): INR, PROTIME in the last 168 hours. Cardiac Enzymes: No results for input(s): CKTOTAL, CKMB, CKMBINDEX, TROPONINI in the last 168 hours. BNP (last 3 results) No results for input(s): PROBNP in the last 8760 hours. HbA1C: No results for input(s): HGBA1C in the last 72 hours. CBG: No results for input(s): GLUCAP in the last 168 hours. Lipid Profile: No results for input(s): CHOL, HDL, LDLCALC, TRIG, CHOLHDL, LDLDIRECT in the last 72 hours. Thyroid Function Tests: No results for input(s): TSH, T4TOTAL, FREET4, T3FREE, THYROIDAB in the last 72 hours. Anemia Panel: No results for input(s): VITAMINB12, FOLATE, FERRITIN, TIBC, IRON, RETICCTPCT in the last 72 hours. Urine analysis:    Component Value Date/Time   COLORURINE YELLOW 03/11/2013 1234   APPEARANCEUR CLEAR 03/11/2013 1234   APPEARANCEUR Clear 12/31/2012 0516   LABSPEC 1.006 03/11/2013 1234   LABSPEC 1.023 12/31/2012 0516   PHURINE 7.5 03/11/2013 1234   GLUCOSEU NEGATIVE 03/11/2013 1234   GLUCOSEU Negative 12/31/2012 0516   HGBUR NEGATIVE 03/11/2013 1234   BILIRUBINUR Negative 08/21/2014 1258   BILIRUBINUR Negative 12/31/2012 0516   KETONESUR NEGATIVE 03/11/2013 1234   PROTEINUR Negative 08/21/2014 1258   PROTEINUR NEGATIVE 03/11/2013 1234   UROBILINOGEN 0.2 08/21/2014 1258   UROBILINOGEN 0.2 03/11/2013 1234    NITRITE Negative 08/21/2014 1258   NITRITE NEGATIVE 03/11/2013 1234   LEUKOCYTESUR Negative 08/21/2014 1258   LEUKOCYTESUR Negative 12/31/2012 0516    Radiological Exams on Admission: Dg Chest 1 View  Result Date: 07/07/2017 CLINICAL DATA:  Fall from ladder. Right hip fracture. Right hip pain. EXAM: CHEST  1 VIEW COMPARISON:  Two-view  chest x-ray 05/06/2016 FINDINGS: The heart size is normal. Lung volumes are low. Lungs are clear. There is no edema or effusion. The visualized soft tissues and bony thorax are unremarkable. IMPRESSION: 1. Low lung volumes. 2. No acute cardiopulmonary disease. Electronically Signed   By: San Morelle M.D.   On: 07/07/2017 18:56   Dg Hip Unilat W Or Wo Pelvis 2-3 Views Right  Result Date: 07/07/2017 CLINICAL DATA:  Fall from ladder 3-4 feet.  Right hip pain. EXAM: DG HIP (WITH OR WITHOUT PELVIS) 2-3V RIGHT COMPARISON:  CT of the abdomen and pelvis 07/05/2017. FINDINGS: A right intratrochanteric fracture is present. There is displacement medially of the lesser trochanter. Femoral head is located. Visualized pelvis is intact.  Proximal left femur is unremarkable. IMPRESSION: Intertrochanteric fracture of the proximal right femur with mild displacement. Electronically Signed   By: San Morelle M.D.   On: 07/07/2017 18:54    EKG: Independently reviewed. 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 Ortho has evaluated, plan for operative repair in the AM. - NPO past midnight - appreciate ortho recs  # Sclerosing mesenteritis - new dx, symptoms very mild.  - outpt gi f/u already scheduled  # dementia - memory appears pretty good! - continue aricept  # major depressive disorder - continue sertraline  DVT  prophylaxis: SCDs for now Code Status: dnr, confirmed w/ pt   Family Communication: son marcey persad [phone]  Disposition Plan: tbd  Consults called: surgery Dr. Erlinda Hong  Admission status: med/surg    Desma Maxim MD [facility] Pager [phone]  If 7PM-7AM, please contact night-coverage [email or web address] Password [email or web address]  07/07/2017, 8:40 PM

## 2017-07-08 ENCOUNTER — Inpatient Hospital Stay (HOSPITAL_COMMUNITY): Payer: Medicare Other | Admitting: Certified Registered"

## 2017-07-08 ENCOUNTER — Inpatient Hospital Stay (HOSPITAL_COMMUNITY): Payer: Medicare Other

## 2017-07-08 ENCOUNTER — Encounter (HOSPITAL_COMMUNITY)
Admission: EM | Disposition: A | Discharge status: Skilled Nursing Facility | Payer: Self-pay | Source: Home / Self Care | Attending: Internal Medicine

## 2017-07-08 DIAGNOSIS — S72001S Fracture of unspecified part of neck of right femur, sequela: Secondary | ICD-10-CM

## 2017-07-08 DIAGNOSIS — S72141A Displaced intertrochanteric fracture of right femur, initial encounter for closed fracture: Secondary | ICD-10-CM

## 2017-07-08 HISTORY — PX: INTRAMEDULLARY (IM) NAIL INTERTROCHANTERIC: SHX5875

## 2017-07-08 LAB — CBC
HEMATOCRIT: 38.6 % (ref 36.0–46.0)
HEMOGLOBIN: 12.4 g/dL (ref 12.0–15.0)
MCH: 29 pg (ref 26.0–34.0)
MCHC: 32.1 g/dL (ref 30.0–36.0)
MCV: 90.2 fL (ref 78.0–100.0)
Platelets: 220 10*3/uL (ref 150–400)
RBC: 4.28 MIL/uL (ref 3.87–5.11)
RDW: 13.3 % (ref 11.5–15.5)
WBC: 14.6 10*3/uL — ABNORMAL HIGH (ref 4.0–10.5)

## 2017-07-08 LAB — SURGICAL PCR SCREEN
MRSA, PCR: NEGATIVE
Staphylococcus aureus: POSITIVE — AB

## 2017-07-08 LAB — CREATININE, SERUM
Creatinine, Ser: 0.73 mg/dL (ref 0.44–1.00)
GFR calc non Af Amer: >60 mL/min (ref >60)

## 2017-07-08 SURGERY — FIXATION, FRACTURE, INTERTROCHANTERIC, WITH INTRAMEDULLARY ROD
Anesthesia: General | Site: Leg Upper | Laterality: Right

## 2017-07-08 MED ORDER — ENOXAPARIN SODIUM 40 MG/0.4ML ~~LOC~~ SOLN
40.0000 mg | SUBCUTANEOUS | Status: DC
  Administered 2017-07-09 – 2017-07-10 (×2): 40 mg via SUBCUTANEOUS
  Filled 2017-07-08 (×2): qty 0.4

## 2017-07-08 MED ORDER — PHENYLEPHRINE HCL 10 MG/ML IJ SOLN
INTRAMUSCULAR | Status: DC | PRN
  Administered 2017-07-08: 25 ug/min via INTRAVENOUS

## 2017-07-08 MED ORDER — PROPOFOL 10 MG/ML IV BOLUS
INTRAVENOUS | Status: AC
  Filled 2017-07-08: qty 20

## 2017-07-08 MED ORDER — LIDOCAINE HCL (CARDIAC) PF 100 MG/5ML IV SOSY
PREFILLED_SYRINGE | INTRAVENOUS | Status: DC | PRN
  Administered 2017-07-08: 60 mg via INTRAVENOUS

## 2017-07-08 MED ORDER — FENTANYL CITRATE (PF) 100 MCG/2ML IJ SOLN
INTRAMUSCULAR | Status: DC | PRN
  Administered 2017-07-08: 50 ug via INTRAVENOUS
  Administered 2017-07-08: 100 ug via INTRAVENOUS
  Administered 2017-07-08: 50 ug via INTRAVENOUS

## 2017-07-08 MED ORDER — ONDANSETRON HCL 4 MG/2ML IJ SOLN: 4.0000 mg | Freq: Four times a day (QID) | INTRAMUSCULAR | Status: DC | PRN

## 2017-07-08 MED ORDER — ENOXAPARIN SODIUM 40 MG/0.4ML ~~LOC~~ SOLN: 40.0000 mg | Freq: Every day | SUBCUTANEOUS | 0 refills | Status: DC

## 2017-07-08 MED ORDER — METHOCARBAMOL 500 MG PO TABS
500.0000 mg | ORAL_TABLET | Freq: Three times a day (TID) | ORAL | Status: DC | PRN
  Administered 2017-07-08 – 2017-07-10 (×4): 500 mg via ORAL
  Filled 2017-07-08 (×4): qty 1

## 2017-07-08 MED ORDER — ONDANSETRON HCL 4 MG PO TABS: 4.0000 mg | ORAL_TABLET | Freq: Four times a day (QID) | ORAL | Status: DC | PRN

## 2017-07-08 MED ORDER — ALUM & MAG HYDROXIDE-SIMETH 200-200-20 MG/5ML PO SUSP: 30.0000 mL | ORAL | Status: DC | PRN

## 2017-07-08 MED ORDER — CELECOXIB 200 MG PO CAPS
200.0000 mg | ORAL_CAPSULE | Freq: Two times a day (BID) | ORAL | Status: DC
  Administered 2017-07-08 – 2017-07-10 (×5): 200 mg via ORAL
  Filled 2017-07-08 (×5): qty 1

## 2017-07-08 MED ORDER — DEXAMETHASONE SODIUM PHOSPHATE 10 MG/ML IJ SOLN
INTRAMUSCULAR | Status: DC | PRN
  Administered 2017-07-08: 10 mg via INTRAVENOUS

## 2017-07-08 MED ORDER — HYDROCODONE-ACETAMINOPHEN 7.5-325 MG PO TABS: 1.0000 | ORAL_TABLET | ORAL | Status: DC | PRN

## 2017-07-08 MED ORDER — METOCLOPRAMIDE HCL 5 MG/ML IJ SOLN: 5.0000 mg | Freq: Three times a day (TID) | INTRAMUSCULAR | Status: DC | PRN

## 2017-07-08 MED ORDER — FENTANYL CITRATE (PF) 100 MCG/2ML IJ SOLN
INTRAMUSCULAR | Status: AC
  Filled 2017-07-08: qty 2

## 2017-07-08 MED ORDER — MORPHINE SULFATE (PF) 2 MG/ML IV SOLN: 0.5000 mg | INTRAVENOUS | Status: DC | PRN

## 2017-07-08 MED ORDER — OXYCODONE HCL 5 MG/5ML PO SOLN: 5.0000 mg | Freq: Once | ORAL | Status: DC | PRN

## 2017-07-08 MED ORDER — ACETAMINOPHEN 500 MG PO TABS
500.0000 mg | ORAL_TABLET | Freq: Four times a day (QID) | ORAL | Status: AC
  Administered 2017-07-08 – 2017-07-09 (×4): 500 mg via ORAL
  Filled 2017-07-08 (×4): qty 1

## 2017-07-08 MED ORDER — DEXAMETHASONE SODIUM PHOSPHATE 10 MG/ML IJ SOLN
INTRAMUSCULAR | Status: AC
  Filled 2017-07-08: qty 1

## 2017-07-08 MED ORDER — PHENYLEPHRINE HCL 10 MG/ML IJ SOLN
INTRAMUSCULAR | Status: DC | PRN
  Administered 2017-07-08 (×2): 40 ug via INTRAVENOUS

## 2017-07-08 MED ORDER — FENTANYL CITRATE (PF) 250 MCG/5ML IJ SOLN
INTRAMUSCULAR | Status: AC
  Filled 2017-07-08: qty 5

## 2017-07-08 MED ORDER — SUGAMMADEX SODIUM 200 MG/2ML IV SOLN
INTRAVENOUS | Status: DC | PRN
  Administered 2017-07-08: 200 mg via INTRAVENOUS

## 2017-07-08 MED ORDER — MENTHOL 3 MG MT LOZG
1.0000 | LOZENGE | OROMUCOSAL | Status: DC | PRN
  Administered 2017-07-09: 3 mg via ORAL
  Filled 2017-07-08: qty 9

## 2017-07-08 MED ORDER — SUCCINYLCHOLINE CHLORIDE 200 MG/10ML IV SOSY
PREFILLED_SYRINGE | INTRAVENOUS | Status: AC
  Filled 2017-07-08: qty 10

## 2017-07-08 MED ORDER — ONDANSETRON HCL 4 MG/2ML IJ SOLN
INTRAMUSCULAR | Status: AC
  Filled 2017-07-08: qty 2

## 2017-07-08 MED ORDER — TRANEXAMIC ACID 1000 MG/10ML IV SOLN
1000.0000 mg | INTRAVENOUS | Status: AC
  Administered 2017-07-08: 1000 mg via INTRAVENOUS
  Filled 2017-07-08: qty 10

## 2017-07-08 MED ORDER — PHENOL 1.4 % MT LIQD: 1.0000 | OROMUCOSAL | Status: DC | PRN

## 2017-07-08 MED ORDER — HYDROCODONE-ACETAMINOPHEN 7.5-325 MG PO TABS: 1.0000 | ORAL_TABLET | Freq: Four times a day (QID) | ORAL | 0 refills | Status: DC | PRN

## 2017-07-08 MED ORDER — POVIDONE-IODINE 10 % EX SWAB: 2.0000 "application " | Freq: Once | CUTANEOUS | Status: DC

## 2017-07-08 MED ORDER — ACETAMINOPHEN 325 MG PO TABS
325.0000 mg | ORAL_TABLET | Freq: Four times a day (QID) | ORAL | Status: DC | PRN
  Administered 2017-07-09 – 2017-07-10 (×3): 650 mg via ORAL
  Filled 2017-07-08 (×4): qty 2

## 2017-07-08 MED ORDER — METOCLOPRAMIDE HCL 5 MG PO TABS: 5.0000 mg | ORAL_TABLET | Freq: Three times a day (TID) | ORAL | Status: DC | PRN

## 2017-07-08 MED ORDER — ROCURONIUM BROMIDE 100 MG/10ML IV SOLN
INTRAVENOUS | Status: DC | PRN
  Administered 2017-07-08: 40 mg via INTRAVENOUS

## 2017-07-08 MED ORDER — PROPOFOL 10 MG/ML IV BOLUS
INTRAVENOUS | Status: DC | PRN
  Administered 2017-07-08: 140 mg via INTRAVENOUS

## 2017-07-08 MED ORDER — DOCUSATE SODIUM 100 MG PO CAPS
100.0000 mg | ORAL_CAPSULE | Freq: Two times a day (BID) | ORAL | Status: DC
  Administered 2017-07-08 – 2017-07-09 (×4): 100 mg via ORAL
  Filled 2017-07-08 (×5): qty 1

## 2017-07-08 MED ORDER — EPHEDRINE SULFATE 50 MG/ML IJ SOLN
INTRAMUSCULAR | Status: DC | PRN
  Administered 2017-07-08: 10 mg via INTRAVENOUS

## 2017-07-08 MED ORDER — 0.9 % SODIUM CHLORIDE (POUR BTL) OPTIME
TOPICAL | Status: DC | PRN
  Administered 2017-07-08: 1000 mL

## 2017-07-08 MED ORDER — SUCCINYLCHOLINE CHLORIDE 20 MG/ML IJ SOLN
INTRAMUSCULAR | Status: DC | PRN
  Administered 2017-07-08: 120 mg via INTRAVENOUS

## 2017-07-08 MED ORDER — ROCURONIUM BROMIDE 50 MG/5ML IV SOLN
INTRAVENOUS | Status: AC
  Filled 2017-07-08: qty 1

## 2017-07-08 MED ORDER — SCOPOLAMINE 1 MG/3DAYS TD PT72
MEDICATED_PATCH | TRANSDERMAL | Status: AC
  Filled 2017-07-08: qty 1

## 2017-07-08 MED ORDER — CEFAZOLIN SODIUM-DEXTROSE 2-4 GM/100ML-% IV SOLN: 2.0000 g | INTRAVENOUS | Status: DC

## 2017-07-08 MED ORDER — LACTATED RINGERS IV SOLN
INTRAVENOUS | Status: DC
  Administered 2017-07-08: 07:00:00 via INTRAVENOUS

## 2017-07-08 MED ORDER — CEFAZOLIN SODIUM-DEXTROSE 2-4 GM/100ML-% IV SOLN
2.0000 g | Freq: Four times a day (QID) | INTRAVENOUS | Status: AC
  Administered 2017-07-08 – 2017-07-09 (×3): 2 g via INTRAVENOUS
  Filled 2017-07-08 (×3): qty 100

## 2017-07-08 MED ORDER — FENTANYL CITRATE (PF) 100 MCG/2ML IJ SOLN
25.0000 ug | INTRAMUSCULAR | Status: DC | PRN
  Administered 2017-07-08: 25 ug via INTRAVENOUS

## 2017-07-08 MED ORDER — ONDANSETRON HCL 4 MG/2ML IJ SOLN: 4.0000 mg | Freq: Once | INTRAMUSCULAR | Status: DC | PRN

## 2017-07-08 MED ORDER — ONDANSETRON HCL 4 MG/2ML IJ SOLN
4.0000 mg | Freq: Once | INTRAMUSCULAR | Status: AC
  Administered 2017-07-08: 4 mg via INTRAVENOUS
  Filled 2017-07-08 (×2): qty 2

## 2017-07-08 MED ORDER — SODIUM CHLORIDE 0.9 % IV SOLN
INTRAVENOUS | Status: DC
  Administered 2017-07-08: 12:00:00 via INTRAVENOUS

## 2017-07-08 MED ORDER — OXYCODONE HCL 5 MG PO TABS: 5.0000 mg | ORAL_TABLET | Freq: Once | ORAL | Status: DC | PRN

## 2017-07-08 MED ORDER — HYDROCODONE-ACETAMINOPHEN 5-325 MG PO TABS
1.0000 | ORAL_TABLET | ORAL | Status: DC | PRN
  Administered 2017-07-08 – 2017-07-09 (×2): 2 via ORAL
  Filled 2017-07-08 (×2): qty 2

## 2017-07-08 MED ORDER — LIDOCAINE 2% (20 MG/ML) 5 ML SYRINGE
INTRAMUSCULAR | Status: AC
  Filled 2017-07-08: qty 5

## 2017-07-08 SURGICAL SUPPLY — 39 items
BNDG COHESIVE 4X5 TAN NS LF (GAUZE/BANDAGES/DRESSINGS) ×2 IMPLANT
BNDG COHESIVE 6X5 TAN STRL LF (GAUZE/BANDAGES/DRESSINGS) IMPLANT
BNDG GAUZE ELAST 4 BULKY (GAUZE/BANDAGES/DRESSINGS) ×2 IMPLANT
COVER PERINEAL POST (MISCELLANEOUS) ×2 IMPLANT
COVER SURGICAL LIGHT HANDLE (MISCELLANEOUS) ×2 IMPLANT
DRAPE STERI IOBAN 125X83 (DRAPES) ×2 IMPLANT
DRSG MEPILEX BORDER 4X4 (GAUZE/BANDAGES/DRESSINGS) ×2 IMPLANT
DRSG MEPILEX BORDER 4X8 (GAUZE/BANDAGES/DRESSINGS) ×2 IMPLANT
DRSG PAD ABDOMINAL 8X10 ST (GAUZE/BANDAGES/DRESSINGS) ×4 IMPLANT
DURAPREP 26ML APPLICATOR (WOUND CARE) ×2 IMPLANT
ELECT REM PT RETURN 9FT ADLT (ELECTROSURGICAL) ×2
ELECTRODE REM PT RTRN 9FT ADLT (ELECTROSURGICAL) ×1 IMPLANT
GLOVE BIOGEL PI IND STRL 7.0 (GLOVE) ×1 IMPLANT
GLOVE BIOGEL PI INDICATOR 7.0 (GLOVE) ×1
GLOVE ECLIPSE 7.0 STRL STRAW (GLOVE) ×2 IMPLANT
GLOVE SKINSENSE NS SZ7.5 (GLOVE) ×2
GLOVE SKINSENSE STRL SZ7.5 (GLOVE) ×2 IMPLANT
GOWN STRL REIN XL XLG (GOWN DISPOSABLE) ×2 IMPLANT
GUIDE PIN 3.2X343 (PIN) ×2
GUIDE PIN 3.2X343MM (PIN) ×2
KIT BASIN OR (CUSTOM PROCEDURE TRAY) ×2 IMPLANT
KIT TURNOVER KIT B (KITS) ×2 IMPLANT
MANIFOLD NEPTUNE II (INSTRUMENTS) ×2 IMPLANT
NAIL TRIGEN 10MMX36CM-125 RT (Nail) ×2 IMPLANT
NS IRRIG 1000ML POUR BTL (IV SOLUTION) ×2 IMPLANT
PACK GENERAL/GYN (CUSTOM PROCEDURE TRAY) ×2 IMPLANT
PAD ARMBOARD 7.5X6 YLW CONV (MISCELLANEOUS) ×4 IMPLANT
PAD CAST 4YDX4 CTTN HI CHSV (CAST SUPPLIES) ×2 IMPLANT
PADDING CAST COTTON 4X4 STRL (CAST SUPPLIES) ×2
PIN GUIDE 3.2X343MM (PIN) ×2 IMPLANT
SCREW LAG COMPR KIT 95/90 (Screw) ×2 IMPLANT
STAPLER VISISTAT 35W (STAPLE) ×2 IMPLANT
SUT VIC AB 0 CT1 27 (SUTURE) ×1
SUT VIC AB 0 CT1 27XBRD ANBCTR (SUTURE) ×1 IMPLANT
SUT VIC AB 2-0 CT1 27 (SUTURE) ×1
SUT VIC AB 2-0 CT1 TAPERPNT 27 (SUTURE) ×1 IMPLANT
TOWEL OR 17X24 6PK STRL BLUE (TOWEL DISPOSABLE) ×2 IMPLANT
TOWEL OR 17X26 10 PK STRL BLUE (TOWEL DISPOSABLE) ×2 IMPLANT
WATER STERILE IRR 1000ML POUR (IV SOLUTION) ×2 IMPLANT

## 2017-07-08 NOTE — Transfer of Care (Signed)
Immediate Anesthesia Transfer of Care Note  Patient: Kristin Kramer  Procedure(s) Performed: RIGHT INTERTROCHANTRIC IM NAIL (Right Leg Upper)  Patient Location: PACU  Anesthesia Type:General  Level of Consciousness: awake, alert  and oriented  Airway & Oxygen Therapy: Patient connected to face mask oxygen  Post-op Assessment: Post -op Vital signs reviewed and stable  Post vital signs: stable  Last Vitals:  Vitals Value Taken Time  BP 117/55 07/08/2017  8:54 AM  Temp 36.2 C 07/08/2017  8:54 AM  Pulse 78 07/08/2017  8:56 AM  Resp 15 07/08/2017  8:56 AM  SpO2 97 % 07/08/2017  8:56 AM  Vitals shown include unvalidated device data.  Last Pain:  Vitals:   07/08/17 0450  TempSrc: Oral  PainSc:          Complications: No apparent anesthesia complications

## 2017-07-08 NOTE — Progress Notes (Signed)
Pt complaining of discomfort in her chest.  MD at bedside.

## 2017-07-08 NOTE — Anesthesia Preprocedure Evaluation (Addendum)

## 2017-07-08 NOTE — Anesthesia Procedure Notes (Addendum)
Procedure Name: Intubation Date/Time: 07/08/2017 7:41 AM Performed by: Lavell Luster, CRNA Pre-anesthesia Checklist: Patient identified, Emergency Drugs available, Suction available, Patient being monitored and Timeout performed Patient Re-evaluated:Patient Re-evaluated prior to induction Oxygen Delivery Method: Circle system utilized Preoxygenation: Pre-oxygenation with 100% oxygen Induction Type: IV induction, Cricoid Pressure applied and Rapid sequence Laryngoscope Size: Mac, 3 and Glidescope Grade View: Grade I Tube type: Oral Tube size: 7.0 mm Number of attempts: 1 Airway Equipment and Method: Video-laryngoscopy and Stylet Placement Confirmation: ETT inserted through vocal cords under direct vision,  positive ETCO2 and breath sounds checked- equal and bilateral Secured at: 21 cm Tube secured with: Tape Dental Injury: Teeth and Oropharynx as per pre-operative assessment  Difficulty Due To: Difficulty was anticipated, Difficult Airway- due to anterior larynx and Difficult Airway- due to limited oral opening Future Recommendations: Recommend- induction with short-acting agent, and alternative techniques readily available Comments: Known difficult intubation from previous records.  Glidescope intubation uneventful with grade I view and easy pass of ETT.  Dr Linna Caprice verified placement.  Henderson Cloud, CRNA

## 2017-07-08 NOTE — Progress Notes (Signed)
MD at the bedside.  ECG complete shows NSR.  Pt states pain in chest has subsided.  Will continue to monitor.

## 2017-07-08 NOTE — Op Note (Signed)
   Date of Surgery: 07/08/2017  INDICATIONS: Kristin Kramer is a 77 y.o.-year-old female who sustained a right hip fracture. The risks and benefits of the procedure discussed with the patient prior to the procedure and all questions were answered; consent was obtained.  PREOPERATIVE DIAGNOSIS: right hip fracture   POSTOPERATIVE DIAGNOSIS: Same   PROCEDURE: Treatment of intertrochanteric fracture with intramedullary implant. CPT 228 [id number]   SURGEON: N. Eduard Roux, M.D.   ASSIST: Ciro Backer [place], [place]; necessary for the timely completion of procedure and due to complexity of procedure.  ANESTHESIA: general   IV FLUIDS AND URINE: See anesthesia record   ESTIMATED BLOOD LOSS: 100 cc  IMPLANTS: [employer] InterTAN 10 x 36, 95/90 lag screws  DRAINS: None.   COMPLICATIONS: None.   DESCRIPTION OF PROCEDURE: The patient was brought to the operating room and placed supine on the operating table. The patient's leg had been signed prior to the procedure. The patient had the anesthesia placed by the anesthesiologist. The prep verification and incision time-outs were performed to confirm that this was the correct patient, site, side and location. The patient had an SCD on the opposite lower extremity. The patient did receive antibiotics prior to the incision and was re-dosed during the procedure as needed at indicated intervals. The patient was positioned on the fracture table with the table in traction and internal rotation to reduce the hip. The well leg was placed in a scissor position and all bony prominences were well-padded. The patient had the lower extremity prepped and draped in the standard surgical fashion. The incision was made 4 finger breadths superior to the greater trochanter. A guide pin was inserted into the tip of the greater trochanter under fluoroscopic guidance. An opening reamer was used to gain access to the femoral canal. The nail length was measured and inserted down the  femoral canal to its proper depth. The appropriate version of insertion for the lag screw was found under fluoroscopy. A pin was inserted up the femoral neck through the jig. Then, a second antirotation pin was inserted inferior to the first pin. The length of the lag screw was then measured. The lag screw was inserted as near to center-center in the head as possible. The antirotation pin was then taken out and an interdigitating compression screw was placed in its place. The leg was taken out of traction, then the interdigitating compression screw was used to compress across the fracture. Compression was visualized on serial xrays. The wound was copiously irrigated with saline and the subcutaneous layer closed with 2.0 vicryl and the skin was reapproximated with staples. The wounds were cleaned and dried a final time and a sterile dressing was placed. The hip was taken through a range of motion at the end of the case under fluoroscopic imaging to visualize the approach-withdraw phenomenon and confirm implant length in the head. The patient was then awakened from anesthesia and taken to the recovery room in stable condition. All counts were correct at the end of the case.   POSTOPERATIVE PLAN: The patient will be weight bearing as tolerated and will return in 2 weeks for staple removal and the patient will receive DVT prophylaxis based on other medications, activity level, and risk ratio of bleeding to thrombosis.   Kristin Cecil, MD [place] 8:31 AM

## 2017-07-08 NOTE — Progress Notes (Signed)
[facility]                                                                              Patient Demographics  Kristin Kramer, is a 77 y.o. female, DOB - 09-May-1940, HKV:[id number]  Admit date - 07/07/2017   Admitting Physician Gwynne Edinger, MD  Outpatient Primary MD for the patient is [name], MD  Outpatient specialists:   LOS - 1  days    Chief Complaint  Patient presents with  . 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[place])   Closed right hip fracture ([place])   Dementia  #1 right intertrochanteric occlusive fracture: Status post hip surgery 07/08/2017 Advanced diet as tolerated Supportive care Orthopedics consult  #2 sclerosing mesenteritis: Mild symptoms  Outpatient GI consult scheduled prior to admission Was informed upon discharge  #3 leukocytosis: No evidence of infectious Suspect stress margination Follow-up confirming    Code Status: DNR DVT Prophylaxis:   SCD's Family Communication: Discussed in detail with the patient, all imaging results, lab results explained to the patient   Disposition Plan: To be determined  Time Spent in minutes  35 minutes  Procedures:  Right hip surgery  Consultants:   Orthopedics  Antimicrobials:      Medications  Scheduled Meds: . acetaminophen  500 mg Oral Q6H  . celecoxib  200 mg Oral BID  . docusate sodium  100 mg Oral BID  . donepezil  5 mg Oral QHS  . [START ON 07/09/2017] enoxaparin (LOVENOX) injection  40 mg Subcutaneous Q24H  . fentaNYL      . loratadine  10 mg Oral Daily  . mupirocin ointment  1 application Nasal BID  . scopolamine      . sertraline  25 mg Oral QODAY  .  sertraline  37.5 mg Oral QODAY   Continuous Infusions: . sodium chloride 75 mL/hr at 07/07/17 1734  . sodium chloride 125 mL/hr at 07/08/17 1214  .  ceFAZolin (ANCEF) IV    . dextrose 5 % and 0.45% NaCl 100 mL/hr (07/08/17 0429)  . lactated ringers     PRN Meds:.[START ON 07/09/2017] acetaminophen, alum & mag hydroxide-simeth, HYDROcodone-acetaminophen, HYDROcodone-acetaminophen, menthol-cetylpyridinium **OR** phenol, metoCLOPramide **OR** metoCLOPramide (REGLAN) injection, morphine injection, ondansetron **OR** ondansetron (ZOFRAN) IV   Antibiotics   Anti-infectives (From admission, onward)   Start     Dose/Rate Route Frequency Ordered Stop   07/08/17 1400  ceFAZolin (ANCEF) IVPB 2g/100 mL premix     2 g 200 mL/hr over 30 Minutes Intravenous Every 6 hours 07/08/17 1015 07/09/17 0759   07/08/17 0730  ceFAZolin (ANCEF) IVPB 2g/100 mL premix  Status:  Discontinued     2 g 200 mL/hr over 30 Minutes Intravenous On call to O.R. 07/08/17 0726 07/08/17 1007   07/07/17 2030  ceFAZolin (ANCEF) IVPB 2g/100 mL premix    Note to Pharmacy:  Anesthesia to give preop   2 g 200 mL/hr over 30 Minutes Intravenous  Once 07/07/17 2028 07/08/17 0810        Subjective:   Kristin Kramer was seen and examined today.   Patient denies dizziness, chest pain, shortness of breath, abdominal pain.  Objective:   Vitals:   07/08/17 0930 07/08/17 0940 07/08/17 0945 07/08/17 1023  BP: 115/71 (!) 112/54  (!) 125/59  Pulse: 70 65 66 67  Resp: 14 11 15 18   Temp:  97.7 F (36.5 C)  98.2 F (36.8 C)  TempSrc:    Oral  SpO2: (!) 85% 95% 99% 100%    Intake/Output Summary (Last 24 hours) at 07/08/2017 1232 Last data filed at 07/08/2017 7106 Gross per 24 hour  Intake 1468.75 ml  Output 300 ml  Net 1168.75 ml     Wt Readings from Last 3 Encounters:  06/29/17 68.3 kg (150 lb 8 oz)  04/19/17 69.9 kg (154 lb)  02/03/17 69.9 kg (154 lb)     Exam  General: NAD  HEENT: NCAT,  PERRL,MMM  Neck: SUPPLE, (-)  JVD  Cardiovascular: RRR, (-) GALLOP, (-) MURMUR  Respiratory: CTA  Gastrointestinal: SOFT, (-) DISTENSION, BS(+), (_) TENDERNESS  Ext: (-) CYANOSIS, (-) EDEMA  Neuro: A, OX 3  Skin:(-) RASH  Psych:NORMAL AFFECT/MOOD   Data Reviewed:  I have personally reviewed following labs and imaging studies  Micro Results Recent Results (from the past 240 hour(s))  Surgical PCR screen     Status: Abnormal   Collection Time: 07/07/17 10:36 PM  Result Value Ref Range Status   MRSA, PCR NEGATIVE NEGATIVE Final   Staphylococcus aureus POSITIVE (A) NEGATIVE Final    Comment: (NOTE) The Xpert SA Assay (FDA approved for NASAL specimens in patients 43 years of age and older), is one component of a comprehensive surveillance program. It is not intended to diagnose infection nor to guide or monitor treatment.     Radiology Reports Dg Chest 1 View  Result Date: 07/07/2017 CLINICAL DATA:  Fall from ladder. Right hip fracture. Right hip pain. EXAM: CHEST  1 VIEW COMPARISON:  Two-view chest x-ray 05/06/2016 FINDINGS: The heart size is normal. Lung volumes are low. Lungs are clear. There is no edema or effusion. The visualized soft tissues and bony thorax are unremarkable. IMPRESSION: 1. Low lung volumes. 2. No acute cardiopulmonary disease. Electronically Signed   By: San Morelle M.D.   On: 07/07/2017 18:56   Ct Abdomen Pelvis W Contrast  Result Date: 07/05/2017 CLINICAL DATA:  Lower abdominal pain and diarrhea. Suspected diverticulitis. EXAM: CT ABDOMEN AND PELVIS WITH CONTRAST TECHNIQUE: Multidetector CT imaging of the abdomen and pelvis was performed using the standard protocol following bolus administration of intravenous contrast. CONTRAST:  18mL ISOVUE-300 IOPAMIDOL (ISOVUE-300) INJECTION 61% COMPARISON:  12/31/2012 FINDINGS: Lower Chest: No acute findings. Hepatobiliary: No hepatic masses identified. Gallbladder is unremarkable. Pancreas:  No mass or inflammatory changes. Spleen: Within  normal limits in size and appearance. Adrenals/Urinary Tract: No masses identified. No evidence of hydronephrosis. Unremarkable unopacified urinary bladder. Stomach/Bowel: No evidence of obstruction, abnormal wall thickening or abnormal fluid collections. Normal appendix visualized. Misty density throughout the central small bowel mesentery is stable compared to previous study, consistent with sclerosing mesenteritis. Vascular/Lymphatic: No pathologically enlarged lymph nodes. No abdominal aortic aneurysm. Aortic atherosclerosis. Reproductive: Prior hysterectomy noted. Adnexal regions are unremarkable in appearance. Other:  None. Musculoskeletal:  No suspicious bone lesions identified. IMPRESSION: No acute findings in the abdomen or pelvis. Stable findings of  chronic sclerosing mesenteritis. Electronically Signed   By: Earle Gell M.D.   On: 07/05/2017 17:32   Dg C-arm 1-60 Min  Result Date: 07/08/2017 CLINICAL DATA:  77 year old female with a history of open reduction internal fixation right femur EXAM: RIGHT FEMUR 2 VIEWS; DG C-ARM 61-120 MIN COMPARISON:  07/07/2017 FINDINGS: Limited intraoperative fluoroscopic spot images of the right femur during ORIF demonstrating antegrade intramedullary rod with cannulated screw fixation of the right femoral neck. Improved alignment at the fracture site. IMPRESSION: Limited intraoperative fluoroscopic spot images during right femur ORIF, as above. Please refer to the dictated operative report for full details of intraoperative findings and procedure. Electronically Signed   By: Corrie Mckusick D.O.   On: 07/08/2017 09:59   Dg Hip Unilat W Or Wo Pelvis 2-3 Views Right  Result Date: 07/07/2017 CLINICAL DATA:  Fall from ladder 3-4 feet.  Right hip pain. EXAM: DG HIP (WITH OR WITHOUT PELVIS) 2-3V RIGHT COMPARISON:  CT of the abdomen and pelvis 07/05/2017. FINDINGS: A right intratrochanteric fracture is present. There is displacement medially of the lesser trochanter. Femoral  head is located. Visualized pelvis is intact.  Proximal left femur is unremarkable. IMPRESSION: Intertrochanteric fracture of the proximal right femur with mild displacement. Electronically Signed   By: San Morelle M.D.   On: 07/07/2017 18:54   Dg Femur, Min 2 Views Right  Result Date: 07/08/2017 CLINICAL DATA:  77 year old female with a history of open reduction internal fixation right femur EXAM: RIGHT FEMUR 2 VIEWS; DG C-ARM 61-120 MIN COMPARISON:  07/07/2017 FINDINGS: Limited intraoperative fluoroscopic spot images of the right femur during ORIF demonstrating antegrade intramedullary rod with cannulated screw fixation of the right femoral neck. Improved alignment at the fracture site. IMPRESSION: Limited intraoperative fluoroscopic spot images during right femur ORIF, as above. Please refer to the dictated operative report for full details of intraoperative findings and procedure. Electronically Signed   By: Corrie Mckusick D.O.   On: 07/08/2017 09:59    Lab Data:  CBC: Recent Labs  Lab 07/07/17 1849 07/08/17 1045  WBC 13.7* 14.6*  NEUTROABS 11.8*  --   HGB 13.3 12.4  HCT 40.9 38.6  MCV 88.9 90.2  PLT 255 462   Basic Metabolic Panel: Recent Labs  Lab 07/07/17 1849 07/08/17 1045  NA 140  --   K 3.6  --   CL 104  --   CO2 22  --   GLUCOSE 111*  --   BUN 20  --   CREATININE 0.83 0.73  CALCIUM 9.3  --    GFR: Estimated Creatinine Clearance: 53.8 mL/min (by C-G formula based on SCr of 0.73 mg/dL). Liver Function Tests: No results for input(s): AST, ALT, ALKPHOS, BILITOT, PROT, ALBUMIN in the last 168 hours. No results for input(s): LIPASE, AMYLASE in the last 168 hours. No results for input(s): AMMONIA in the last 168 hours. Coagulation Profile: Recent Labs  Lab 07/07/17 2255  INR 1.06   Cardiac Enzymes: No results for input(s): CKTOTAL, CKMB, CKMBINDEX, TROPONINI in the last 168 hours. BNP (last 3 results) No results for input(s): PROBNP in the last 8760  hours. HbA1C: No results for input(s): HGBA1C in the last 72 hours. CBG: No results for input(s): GLUCAP in the last 168 hours. Lipid Profile: No results for input(s): CHOL, HDL, LDLCALC, TRIG, CHOLHDL, LDLDIRECT in the last 72 hours. Thyroid Function Tests: No results for input(s): TSH, T4TOTAL, FREET4, T3FREE, THYROIDAB in the last 72 hours. Anemia Panel: No results for input(s): VITAMINB12,  FOLATE, FERRITIN, TIBC, IRON, RETICCTPCT in the last 72 hours. Urine analysis:    Component Value Date/Time   COLORURINE YELLOW 03/11/2013 1234   APPEARANCEUR CLEAR 03/11/2013 1234   APPEARANCEUR Clear 12/31/2012 0516   LABSPEC 1.006 03/11/2013 1234   LABSPEC 1.023 12/31/2012 0516   PHURINE 7.5 03/11/2013 1234   GLUCOSEU NEGATIVE 03/11/2013 1234   GLUCOSEU Negative 12/31/2012 0516   HGBUR NEGATIVE 03/11/2013 1234   BILIRUBINUR Negative 08/21/2014 1258   BILIRUBINUR Negative 12/31/2012 0516   KETONESUR NEGATIVE 03/11/2013 1234   PROTEINUR Negative 08/21/2014 1258   PROTEINUR NEGATIVE 03/11/2013 1234   UROBILINOGEN 0.2 08/21/2014 1258   UROBILINOGEN 0.2 03/11/2013 1234   NITRITE Negative 08/21/2014 1258   NITRITE NEGATIVE 03/11/2013 1234   LEUKOCYTESUR Negative 08/21/2014 1258   LEUKOCYTESUR Negative 12/31/2012 0516     Benito Mccreedy M.D. [facility] 07/08/2017, 12:32 PM  Pager: [phone] Between 7am to 7pm - call Pager - [phone]  After 7pm go to [email or web address] - password [email or web address]  Call night coverage person covering after 7pm

## 2017-07-08 NOTE — Anesthesia Postprocedure Evaluation (Signed)
Anesthesia Post Note  Patient: [name]  Procedure(s) Performed: RIGHT INTERTROCHANTRIC IM NAIL (Right Leg Upper)     Patient location during evaluation: PACU Anesthesia Type: General Level of consciousness: awake and alert Pain management: pain level controlled Vital Signs Assessment: post-procedure vital signs reviewed and stable Respiratory status: spontaneous breathing, nonlabored ventilation, respiratory function stable and patient connected to nasal cannula oxygen Cardiovascular status: blood pressure returned to baseline and stable Postop Assessment: no apparent nausea or vomiting Anesthetic complications: no    Last Vitals:  Vitals:   07/08/17 0945 07/08/17 1023  BP:  (!) 125/59  Pulse: 66 67  Resp: 15 18  Temp:  36.8 C  SpO2: 99% 100%    Last Pain:  Vitals:   07/08/17 1039  TempSrc:   PainSc: 0-No pain                 Nasri Boakye COKER

## 2017-07-08 NOTE — Discharge Instructions (Signed)
° ° °  1. Change dressings as needed °2. May shower but keep incisions covered and dry °3. Take lovenox to prevent blood clots °4. Take stool softeners as needed °5. Take pain meds as needed ° °

## 2017-07-08 NOTE — Consult Note (Signed)
ORTHOPAEDIC CONSULTATION  REQUESTING PHYSICIAN: Benito Mccreedy, MD  Chief Complaint: Right intertroch hip fracture  HPI: Kristin Kramer is a 77 y.o. female who presents with right hip fracture s/p mechanical fall off of a ladder.  The patient endorses severe pain in the right hip, that does not radiate, grinding in quality, worse with any movement, better with immobilization.  Denies LOC/fever/chills/nausea/vomiting.  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  Does live independently.  Denies LOC, neck pain, abd pain.  Past Medical History:  Diagnosis Date  . Abdominal pain, other specified site 10.2014   [facility] ER - mesenteric adenitis vs sclerosing mesenteritis vs nonspecific lymphadenitis  . Abnormal ECG    a. left axis deviation;  b. 12/2011 Echo: EF 55-60%, no rwma, pasp 16mmHg.  . Arthritis   . Depression    prior on lexapro, celexa, wellbutrin.  high cymbalta doses cause tremors  . H/O seasonal allergies   . History of pneumonia   . History of UTI    12/2011 - Treatment began preoperatively with CIPRO 500mg  BID  . HLD (hyperlipidemia)    statin intolerance  . Knee osteoarthritis 2013, 2014   bilateral s/p B TKR Noemi [facility])  . Knee osteoarthritis   . 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 Migraines   . Osteopenia 08/2016   hip -2.4, spine -1.4  . Post-operative nausea and vomiting   . Refusal of blood transfusions as patient is Jehovah's Witness   . Seasonal allergies    cats, dust, mold, roaches  . 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 Isac Sarna), R tibia - then recurrent  . Vitamin D deficiency    Past Surgical History:  Procedure Laterality Date  . BREAST ENHANCEMENT SURGERY    . BUNIONECTOMY  2013   R and L foot  . CATARACT EXTRACTION  2013   R, pending L  . COLONOSCOPY  2007   no records received  .  COLONOSCOPY  08/2014   tubular adenoma, rpt 5 yrs [facility])  . COSMETIC SURGERY  2002   face lift  . dexa  06/2008   T -1.4 spine and hip  . ETHMOIDECTOMY Bilateral 05/19/2016   Procedure: ETHMOIDECTOMY;  Surgeon: Carloyn Manner, MD;  Location: [facility] ORS;  Service: ENT;  Laterality: Bilateral;  . EYE SURGERY     bilateral cataract surgery  . FRONTAL SINUS EXPLORATION Bilateral 05/19/2016   Procedure: FRONTAL SINUS EXPLORATION;  Surgeon: Carloyn Manner, MD;  Location: [facility] ORS;  Service: ENT;  Laterality: Bilateral;  . JOINT REPLACEMENT Bilateral 2015   knees  . MAXILLARY ANTROSTOMY Bilateral 05/19/2016   Procedure: MAXILLARY ANTROSTOMY;  Surgeon: Carloyn Manner, MD;  Location: [facility] ORS;  Service: ENT;  Laterality: Bilateral;  . MOHS SURGERY  2011   R leg  . PARTIAL HYSTERECTOMY  1980   fibroids, ovaries remained  . SINUS ENDO W/FUSION N/A 05/19/2016   Procedure: ENDOSCOPIC SINUS SURGERY WITH NAVIGATION;  Surgeon: Carloyn Manner, MD;  Location: [facility] ORS;  Service: ENT;  Laterality: N/A;  . SPHENOIDECTOMY Bilateral 05/19/2016   Procedure: Coralee Pesa;  Surgeon: Carloyn Manner, MD;  Location: [facility] ORS;  Service: ENT;  Laterality: Bilateral;  . SQUAMOUS CELL CARCINOMA EXCISION Right 07/13/2016   Dr. Rolm Bookbinder, [facility]  . TONSILLECTOMY AND ADENOIDECTOMY  1945  . TOTAL KNEE ARTHROPLASTY  01/02/2012   Right -  Surgeon: Lorn Junes, MD  . TOTAL KNEE ARTHROPLASTY Left 03/18/2013   Surgeon: Lorn Junes, MD   Social History   Socioeconomic History  . Marital status: Widowed    Spouse name: Not on file  . Number of children: 3  . Years of education: Not on file  . Highest education level: Not on file  Occupational History  . Occupation: Retired  Scientific laboratory technician  . Financial resource strain: Not on file  . Food insecurity:    Worry: Not on file    Inability: Not on file  . Transportation needs:    Medical: Not on file    Non-medical: Not on file  Tobacco  Use  . Smoking status: Never Smoker  . Smokeless tobacco: Never Used  Substance and Sexual Activity  . Alcohol use: Yes    Alcohol/week: 0.0 oz    Comment: occasional // 5-6 drinks per month  . Drug use: No  . Sexual activity: Never  Lifestyle  . Physical activity:    Days per week: Not on file    Minutes per session: Not on file  . Stress: Not on file  Relationships  . Social connections:    Talks on phone: Not on file    Gets together: Not on file    Attends religious service: Not on file    Active member of club or organization: Not on file    Attends meetings of clubs or organizations: Not on file    Relationship status: Not on file  Other Topics Concern  . Not on file  Social History Narrative   Lives alone.  Brother lives nearby.   Occupation: retired, was Network engineer and housewife   Edu: 1 yr college   Activity: limited by knee   Diet: good water, fruits/vegetables daily   Religion: Jehova's witness   Family History  Problem Relation Age of Onset  . Cancer Mother 27       lung, smoker  . Dementia Mother 22  . Cancer Father        lung, smoker  . Cancer Maternal Grandmother 43       leukemia  . Coronary artery disease Paternal Grandmother 68       sudden cardiac death  . Diabetes Neg Hx   . Stroke Neg Hx    Allergies  Allergen Reactions  . Lipitor [Atorvastatin] Other (See Comments)    Cramps  . Pravastatin Other (See Comments)    Cramps    Prior to Admission medications   Medication Sig Start Date End Date Taking? Authorizing Provider  acetaminophen (TYLENOL) 325 MG tablet Take 650 mg by mouth every 6 (six) hours as needed. For pain   Yes [provider]  Biotin (BIOTIN 5000) 5 MG CAPS Take 5 mg by mouth daily.    Yes [provider]  Cholecalciferol (VITAMIN D3) 5000 units CAPS Take 1 capsule (5,000 Units total) by mouth daily. 08/08/15  Yes [name], MD  Coenzyme Q10 (COQ-10 PO) Take 900 mg by mouth daily.    Yes [provider]  donepezil (ARICEPT) 5 MG tablet Take 1 tablet (5 mg total) by mouth daily. Patient taking differently: Take 5 mg by mouth at bedtime.  04/19/17  Yes [name], MD  loratadine (CLARITIN) 10 MG tablet Take 1 tablet (10 mg total) by mouth daily. 04/19/17  Yes [name], MD  metroNIDAZOLE (METROCREAM) 0.75 % cream Apply 1 application topically 2 (two) times daily. Applied daily to patient  face 04/08/16  Yes [provider]  montelukast (SINGULAIR) 10 MG tablet Take 1 tablet (10 mg total) by mouth at bedtime. 04/19/17  Yes [name], MD  niacinamide 500 MG tablet Take 500 mg by mouth daily. Per dermatology   Yes [provider]  Omega-3 Fatty Acids (FISH OIL) 1000 MG CAPS Take 1,000 mg by mouth daily.    Yes [provider]  Potassium 99 MG TABS Take 1 tablet by mouth daily.   Yes [provider]  Probiotic Product (PROBIOTIC PO) Take 1 capsule by mouth daily.    Yes [provider]  ranitidine (ZANTAC) 150 MG tablet Take 150 mg by mouth 2 (two) times daily as needed. For acid reflux/indigestion/GERD 03/03/16  Yes [provider]  sertraline (ZOLOFT) 25 MG tablet ALTERNATE 1 TABLET AND 1.5 TABLETS EVERY OTHER DAY Patient taking differently: 1 TABLET EVERY  at bedtime 02/22/17  Yes [name], MD  TURMERIC CURCUMIN PO Take 900 mg by mouth daily.    Yes [provider]  vitamin B-12 (CYANOCOBALAMIN) 1000 MCG tablet Take 1,000 mcg by mouth daily.   Yes [provider]  calcium carbonate (OS-CAL) 600 MG TABS tablet Take 1 tablet (600 mg total) by mouth daily with breakfast. Patient not taking: Reported on 07/07/2017 08/04/15   [name], MD   Dg Chest 1 View  Result Date: 07/07/2017 CLINICAL DATA:  Fall from ladder. Right hip fracture. Right hip pain. EXAM: CHEST  1 VIEW COMPARISON:  Two-view chest x-ray 05/06/2016 FINDINGS: The heart size is normal. Lung volumes are low. Lungs are clear.  There is no edema or effusion. The visualized soft tissues and bony thorax are unremarkable. IMPRESSION: 1. Low lung volumes. 2. No acute cardiopulmonary disease. Electronically Signed   By: San Morelle M.D.   On: 07/07/2017 18:56   Dg Hip Unilat W Or Wo Pelvis 2-3 Views Right  Result Date: 07/07/2017 CLINICAL DATA:  Fall from ladder 3-4 feet.  Right hip pain. EXAM: DG HIP (WITH OR WITHOUT PELVIS) 2-3V RIGHT COMPARISON:  CT of the abdomen and pelvis 07/05/2017. FINDINGS: A right intratrochanteric fracture is present. There is displacement medially of the lesser trochanter. Femoral head is located. Visualized pelvis is intact.  Proximal left femur is unremarkable. IMPRESSION: Intertrochanteric fracture of the proximal right femur with mild displacement. Electronically Signed   By: San Morelle M.D.   On: 07/07/2017 18:54    All pertinent xrays, MRI, CT independently reviewed and interpreted  Positive ROS: All other systems have been reviewed and were otherwise negative with the exception of those mentioned in the HPI and as above.  Physical Exam: General: Alert, no acute distress Cardiovascular: No pedal edema Respiratory: No cyanosis, no use of accessory musculature GI: No organomegaly, abdomen is soft and non-tender Skin: No lesions in the area of chief complaint Neurologic: Sensation intact distally Psychiatric: Patient is competent for consent with normal mood and affect Lymphatic: No axillary or cervical lymphadenopathy  MUSCULOSKELETAL:  - pain with movement of the hip and extremity - skin intact - NVI distally - compartments soft  Assessment: Right intertroch hip fracture  Plan: - surgical fixation is recommended, patient and family are aware of r/b/a and wish to proceed - consent obtained - medical optimization per primary team  Thank you for the consult and the opportunity to see Ms. London Sheer Eduard Roux, MD [place] 7:26  AM

## 2017-07-09 LAB — CBC
HCT: 33 % — ABNORMAL LOW (ref 36.0–46.0)
Hemoglobin: 10.4 g/dL — ABNORMAL LOW (ref 12.0–15.0)
MCH: 28.3 pg (ref 26.0–34.0)
MCHC: 31.5 g/dL (ref 30.0–36.0)
MCV: 89.9 fL (ref 78.0–100.0)
PLATELETS: 207 10*3/uL (ref 150–400)
RBC: 3.67 MIL/uL — ABNORMAL LOW (ref 3.87–5.11)
RDW: 13.3 % (ref 11.5–15.5)
WBC: 9.9 10*3/uL (ref 4.0–10.5)

## 2017-07-09 LAB — BASIC METABOLIC PANEL
ANION GAP: 7 (ref 5–15)
BUN: 13 mg/dL (ref 6–20)
CALCIUM: 8.5 mg/dL — AB (ref 8.9–10.3)
CO2: 30 mmol/L (ref 22–32)
Chloride: 103 mmol/L (ref 101–111)
Creatinine, Ser: 0.69 mg/dL (ref 0.44–1.00)
GFR calc non Af Amer: >60 mL/min (ref >60)
Glucose, Bld: 130 mg/dL — ABNORMAL HIGH (ref 65–99)
Potassium: 3.6 mmol/L (ref 3.5–5.1)
Sodium: 140 mmol/L (ref 135–145)

## 2017-07-09 LAB — NO BLOOD PRODUCTS

## 2017-07-09 NOTE — Clinical Social Work Note (Signed)
Clinical Social Work Assessment  Patient Details  Name: Kristin Kramer MRN: [id number] Date of Birth: [birth year]  Date of referral:  07/09/17               Reason for consult:  Facility Placement                Permission sought to share information with:  Family Supports Permission granted to share information::  Yes, Release of Information Signed  Name::     Juanda Crumble   Agency::  [facility]  Relationship::  Brother  Contact Information:  [phone]  Housing/Transportation Living arrangements for the past 2 months:  [place] of Information:  Patient Patient Interpreter Needed:  None Criminal Activity/Legal Involvement Pertinent to Current Situation/Hospitalization:  No - Comment as needed Significant Relationships:  Friend, Siblings Lives with:  Self Do you feel safe going back to the place where you live?  No Need for family participation in patient care:  No (Coment)  Care giving concerns:  Pt is alert and oriented. Pt was living home alone, independently prior to admission.   Social Worker assessment / plan:  CSW spoke with pt at bedside. Pt has been to [facility] twice in the past. At this time pt prefers [facility] or [place]. Pt is agreeable to f/o to these two facilities. CSW to follow up with facilities to determine bed availability.   Employment status:  Retired Forensic scientist:  Medicare PT Recommendations:  [place] / Referral to community resources:  [place]  Patient/Family's Response to care:  Pt verbalized understanding of CSW role and expressed appreciation for support. Pt denies any concern regarding pt care at this time.   Patient/Family's Understanding of and Emotional Response to Diagnosis, Current Treatment, and Prognosis:  Pt understanding and realistic regarding physical limitations. Pt understands the need for SNF placement at d/c. Pt agreeable to SNF placement at d/c, at this time. Pt's  responses emotionally appropriate during conversation with CSW. Pt denies any concern regarding treatment plan at this time. CSW will continue to provide support and facilitate d/c needs.   Emotional Assessment Appearance:  Appears stated age Attitude/Demeanor/Rapport:  (Patient was appropriate) Affect (typically observed):  Accepting, Appropriate, Calm Orientation:  Oriented to Self, Oriented to Place, Oriented to  Time, Oriented to Situation Alcohol / Substance use:  Not Applicable Psych involvement (Current and /or in the community):  No (Comment)  Discharge Needs  Concerns to be addressed:  Basic Needs, Care Coordination Readmission within the last 30 days:  No Current discharge risk:  Dependent with Mobility Barriers to Discharge:  Continued Medical Work up   [employer], LCSW 07/09/2017, 3:15 PM

## 2017-07-09 NOTE — Progress Notes (Signed)
   Subjective:  Patient reports pain as mild.  Pain well controlled  Objective:   VITALS:   Vitals:   07/08/17 1658 07/08/17 1800 07/08/17 2003 07/09/17 0415  BP: (!) 103/59  (!) 111/54 (!) 105/57  Pulse: 61  62 (!) 59  Resp: 18  17 16   Temp: 98.3 F (36.8 C)  98.7 F (37.1 C) 98.3 F (36.8 C)  TempSrc: Oral  Oral Oral  SpO2: 99% 96% 96% 96%    Neurologically intact Neurovascular intact Sensation intact distally Intact pulses distally Dorsiflexion/Plantar flexion intact Incision: dressing C/D/I and no drainage No cellulitis present Compartment soft   Lab Results  Component Value Date   WBC 9.9 07/09/2017   HGB 10.4 (L) 07/09/2017   HCT 33.0 (L) 07/09/2017   MCV 89.9 07/09/2017   PLT 207 07/09/2017     Assessment/Plan:  1 Day Post-Op   - Expected postop acute blood loss anemia - will monitor for symptoms - Up with PT/OT - DVT ppx - SCDs, ambulation, lovenox - WBAT operative extremity - Pain control - Discharge planning - mobilizing very well with PT but lives alone so wound benefit from short term stay at [facility] 07/09/2017, 8:56 AM [phone]

## 2017-07-09 NOTE — Plan of Care (Signed)
  Problem: Nutrition: Goal: Adequate nutrition will be maintained Outcome: Progressing   Problem: Elimination: Goal: Will not experience complications related to bowel motility Outcome: Progressing   Problem: Skin Integrity: Goal: Risk for impaired skin integrity will decrease Outcome: Progressing   

## 2017-07-09 NOTE — NC FL2 (Signed)
[place] LEVEL OF CARE SCREENING TOOL     IDENTIFICATION  Patient Name: Kristin Kramer Birthdate: [birth year] Sex: female Admission Date (Current Location): 07/07/2017  [facility] and [place] Number:  Herbalist and Address:  [facility], [place] [address], [place], [place] [address]      Provider Number: [id number]  Attending Physician Name and Address:  Benito Mccreedy, MD  Relative Name and Phone Number:       Current Level of Care: Hospital Recommended Level of Care: [place] Prior Approval Number:    Date Approved/Denied:   PASRR Number:  [id number]   Discharge Plan: SNF    Current Diagnoses: Patient Active Problem List   Diagnosis Date Noted  . Closed right hip fracture ([place]) 07/07/2017  . Dementia 07/07/2017  . Chronic nasal congestion 04/19/2017  . Exertional dyspnea 04/19/2017  . Memory deficit 08/05/2016  . 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 Advanced care planning/counseling discussion 07/31/2014  . Diarrhea 07/31/2014  . Herpes zoster 02/19/2014  . Allergic rhinitis 04/06/2013  . 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  . Post-operative nausea and vomiting   . Refusal of blood transfusions as patient is Jehovah's Witness   . Medicare annual wellness visit, subsequent 01/14/2013  . Transaminitis 01/03/2013  . 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 Osteopenia   . HLD (hyperlipidemia)   . Vitamin D deficiency   . Anxiety 01/05/2012  . Seasonal allergies   . Squamous cell skin cancer   . MDD (major depressive disorder), recurrent episode, moderate ([place])   . Migraines   . Abnormal EKG   . Arthritis     Orientation RESPIRATION BLADDER Height & Weight     Self, Time, Situation, Place  O2(Nasal Cannula;2L) Continent Weight:   Height:     BEHAVIORAL SYMPTOMS/MOOD NEUROLOGICAL BOWEL NUTRITION STATUS   Continent Diet(regular diet, thin liquids)  AMBULATORY STATUS COMMUNICATION OF NEEDS Skin   Limited Assist Verbally Surgical wounds(Closed incision, right leg: Silicone dressing)                       Personal Care Assistance Level of Assistance  Bathing, Feeding, Dressing Bathing Assistance: Limited assistance Feeding assistance: Independent Dressing Assistance: Limited assistance     Functional Limitations Info  Sight, Hearing, Speech Sight Info: Adequate Hearing Info: Adequate Speech Info: Adequate    SPECIAL CARE FACTORS FREQUENCY  PT (By licensed PT), OT (By licensed OT)     PT Frequency: 3x OT Frequency: 3x            Contractures Contractures Info: Not present    Additional Factors Info  Code Status, Allergies Code Status Info: DNR Allergies Info: Lipitor Atorvastatin, Pravastatin           Current Medications (07/09/2017):  This is the current hospital active medication list Current Facility-Administered Medications  Medication Dose Route Frequency Provider Last Rate Last Dose  . 0.9 %  sodium chloride infusion   Intravenous Continuous Leandrew Koyanagi, MD 75 mL/hr at 07/07/17 1734    . 0.9 %  sodium chloride infusion   Intravenous Continuous Leandrew Koyanagi, MD 125 mL/hr at 07/08/17 1214    . acetaminophen (TYLENOL) tablet 325-650 mg  325-650 mg Oral Q6H PRN Leandrew Koyanagi, MD      . alum & mag hydroxide-simeth (MAALOX/MYLANTA) 200-200-20 MG/5ML suspension 30 mL  30 mL Oral Q4H  PRN Leandrew Koyanagi, MD      . celecoxib (CELEBREX) capsule 200 mg  200 mg Oral BID Leandrew Koyanagi, MD   200 mg at 07/09/17 0824  . dextrose 5 %-0.45 % sodium chloride infusion   Intravenous Continuous Leandrew Koyanagi, MD 100 mL/hr at 07/08/17 0429 100 mL/hr at 07/08/17 0429  . docusate sodium (COLACE) capsule 100 mg  100 mg Oral BID Leandrew Koyanagi, MD   100 mg at 07/09/17 7902  . donepezil (ARICEPT) tablet 5 mg  5 mg Oral QHS Leandrew Koyanagi, MD   5 mg at 07/08/17 2017  . enoxaparin (LOVENOX)  injection 40 mg  40 mg Subcutaneous Q24H Leandrew Koyanagi, MD   40 mg at 07/09/17 0827  . HYDROcodone-acetaminophen (NORCO) 7.5-325 MG per tablet 1-2 tablet  1-2 tablet Oral Q4H PRN Leandrew Koyanagi, MD      . HYDROcodone-acetaminophen (NORCO/VICODIN) 5-325 MG per tablet 1-2 tablet  1-2 tablet Oral Q4H PRN Leandrew Koyanagi, MD   2 tablet at 07/09/17 4097  . lactated ringers infusion   Intravenous Continuous Leandrew Koyanagi, MD      . loratadine (CLARITIN) tablet 10 mg  10 mg Oral Daily Leandrew Koyanagi, MD   10 mg at 07/09/17 0824  . menthol-cetylpyridinium (CEPACOL) lozenge 3 mg  1 lozenge Oral PRN Leandrew Koyanagi, MD   3 mg at 07/09/17 0730   Or  . phenol (CHLORASEPTIC) mouth spray 1 spray  1 spray Mouth/Throat PRN Leandrew Koyanagi, MD      . methocarbamol (ROBAXIN) tablet 500 mg  500 mg Oral Q8H PRN Lovey Newcomer T, NP   500 mg at 07/09/17 0824  . metoCLOPramide (REGLAN) tablet 5-10 mg  5-10 mg Oral Q8H PRN Leandrew Koyanagi, MD       Or  . metoCLOPramide (REGLAN) injection 5-10 mg  5-10 mg Intravenous Q8H PRN Leandrew Koyanagi, MD      . morphine 2 MG/ML injection 0.5-1 mg  0.5-1 mg Intravenous Q2H PRN Leandrew Koyanagi, MD      . mupirocin ointment (BACTROBAN) 2 % 1 application  1 application Nasal BID Leandrew Koyanagi, MD   1 application at 35/32/99 [phone]  . ondansetron (ZOFRAN) tablet 4 mg  4 mg Oral Q6H PRN Leandrew Koyanagi, MD       Or  . ondansetron [facility]) injection 4 mg  4 mg Intravenous Q6H PRN Leandrew Koyanagi, MD      . sertraline (ZOLOFT) tablet 25 mg  25 mg Oral Geanie Cooley, MD   25 mg at 07/09/17 8341  . sertraline (ZOLOFT) tablet 37.5 mg  37.5 mg Oral Geanie Cooley, MD   37.5 mg at 07/08/17 1211     Discharge Medications: Please see discharge summary for a list of discharge medications.  Relevant Imaging Results:  Relevant Lab Results:   Additional Information SSN; [id number]  Eileen Stanford, LCSW

## 2017-07-09 NOTE — Progress Notes (Signed)
[facility]                                                                              Patient Demographics  Kristin Kramer, is a 77 y.o. female, DOB - 11/11/1940, ELF:[id number]  Admit date - 07/07/2017   Admitting Physician Gwynne Edinger, MD  Outpatient Primary MD for the patient is [name], MD  Outpatient specialists:   LOS - 2  days    Chief Complaint  Patient presents with  . 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[place])   Closed right hip fracture ([place])   Dementia  #1 right intertrochanteric occlusive fracture: Status post hip surgery 07/08/2017 Advanced diet as tolerated Supportive care Orthopedics consult  #2 sclerosing mesenteritis: Mild symptoms  Outpatient GI consult scheduled prior to admission Was informed upon discharge  #3 leukocytosis: No evidence of infectious Suspect stress margination Follow-up confirming    Code Status: DNR DVT Prophylaxis:   SCD's Family Communication: Discussed in detail with the patient, all imaging results, lab results explained to the patient   Disposition Plan: To be determined  Time Spent in minutes  35 minutes  Procedures:  Right hip surgery  Consultants:   Orthopedics  Antimicrobials:      Medications  Scheduled Meds: . celecoxib  200 mg Oral BID  . docusate sodium  100 mg Oral BID  . donepezil  5 mg Oral QHS  . enoxaparin (LOVENOX) injection  40 mg Subcutaneous Q24H  . loratadine  10 mg Oral Daily  . mupirocin ointment  1 application Nasal BID  . sertraline  25 mg Oral QODAY  . sertraline  37.5 mg Oral QODAY   Continuous Infusions: . sodium chloride 75 mL/hr at  07/07/17 1734  . sodium chloride 125 mL/hr at 07/08/17 1214  . dextrose 5 % and 0.45% NaCl 100 mL/hr (07/08/17 0429)  . lactated ringers     PRN Meds:.acetaminophen, alum & mag hydroxide-simeth, HYDROcodone-acetaminophen, HYDROcodone-acetaminophen, menthol-cetylpyridinium **OR** phenol, methocarbamol, metoCLOPramide **OR** metoCLOPramide (REGLAN) injection, morphine injection, ondansetron **OR** ondansetron (ZOFRAN) IV   Antibiotics   Anti-infectives (From admission, onward)   Start     Dose/Rate Route Frequency Ordered Stop   07/08/17 1400  ceFAZolin (ANCEF) IVPB 2g/100 mL premix     2 g 200 mL/hr over 30 Minutes Intravenous Every 6 hours 07/08/17 1015 07/09/17 0148   07/08/17 0730  ceFAZolin (ANCEF) IVPB 2g/100 mL premix  Status:  Discontinued     2 g 200 mL/hr over 30 Minutes Intravenous On call to O.R. 07/08/17 0726 07/08/17 1007   07/07/17 2030  ceFAZolin (ANCEF) IVPB 2g/100 mL premix    Note to Pharmacy:  Anesthesia to give preop   2 g 200 mL/hr over 30 Minutes Intravenous  Once 07/07/17 2028 07/08/17 0810        Subjective:   Kristin Kramer was seen  and examined today.   Patient denies dizziness, chest pain, shortness of breath, abdominal pain.  Objective:   Vitals:   07/08/17 1658 07/08/17 1800 07/08/17 2003 07/09/17 0415  BP: (!) 103/59  (!) 111/54 (!) 105/57  Pulse: 61  62 (!) 59  Resp: 18  17 16   Temp: 98.3 F (36.8 C)  98.7 F (37.1 C) 98.3 F (36.8 C)  TempSrc: Oral  Oral Oral  SpO2: 99% 96% 96% 96%    Intake/Output Summary (Last 24 hours) at 07/09/2017 1408 Last data filed at 07/09/2017 1300 Gross per 24 hour  Intake 925.83 ml  Output 1850 ml  Net -924.17 ml     Wt Readings from Last 3 Encounters:  06/29/17 68.3 kg (150 lb 8 oz)  04/19/17 69.9 kg (154 lb)  02/03/17 69.9 kg (154 lb)     Exam  General: NAD  HEENT: NCAT,  PERRL,MMM  Neck: SUPPLE, (-) JVD  Cardiovascular: RRR, (-) GALLOP, (-) MURMUR  Respiratory: CTA  Gastrointestinal:  SOFT, (-) DISTENSION, BS(+), (_) TENDERNESS  Ext: (-) CYANOSIS, (-) EDEMA  Neuro: A, OX 3  Skin:(-) RASH  Psych:NORMAL AFFECT/MOOD   Data Reviewed:  I have personally reviewed following labs and imaging studies  Micro Results Recent Results (from the past 240 hour(s))  Surgical PCR screen     Status: Abnormal   Collection Time: 07/07/17 10:36 PM  Result Value Ref Range Status   MRSA, PCR NEGATIVE NEGATIVE Final   Staphylococcus aureus POSITIVE (A) NEGATIVE Final    Comment: (NOTE) The Xpert SA Assay (FDA approved for NASAL specimens in patients 29 years of age and older), is one component of a comprehensive surveillance program. It is not intended to diagnose infection nor to guide or monitor treatment.     Radiology Reports Dg Chest 1 View  Result Date: 07/07/2017 CLINICAL DATA:  Fall from ladder. Right hip fracture. Right hip pain. EXAM: CHEST  1 VIEW COMPARISON:  Two-view chest x-ray 05/06/2016 FINDINGS: The heart size is normal. Lung volumes are low. Lungs are clear. There is no edema or effusion. The visualized soft tissues and bony thorax are unremarkable. IMPRESSION: 1. Low lung volumes. 2. No acute cardiopulmonary disease. Electronically Signed   By: San Morelle M.D.   On: 07/07/2017 18:56   Ct Abdomen Pelvis W Contrast  Result Date: 07/05/2017 CLINICAL DATA:  Lower abdominal pain and diarrhea. Suspected diverticulitis. EXAM: CT ABDOMEN AND PELVIS WITH CONTRAST TECHNIQUE: Multidetector CT imaging of the abdomen and pelvis was performed using the standard protocol following bolus administration of intravenous contrast. CONTRAST:  138mL ISOVUE-300 IOPAMIDOL (ISOVUE-300) INJECTION 61% COMPARISON:  12/31/2012 FINDINGS: Lower Chest: No acute findings. Hepatobiliary: No hepatic masses identified. Gallbladder is unremarkable. Pancreas:  No mass or inflammatory changes. Spleen: Within normal limits in size and appearance. Adrenals/Urinary Tract: No masses identified. No  evidence of hydronephrosis. Unremarkable unopacified urinary bladder. Stomach/Bowel: No evidence of obstruction, abnormal wall thickening or abnormal fluid collections. Normal appendix visualized. Misty density throughout the central small bowel mesentery is stable compared to previous study, consistent with sclerosing mesenteritis. Vascular/Lymphatic: No pathologically enlarged lymph nodes. No abdominal aortic aneurysm. Aortic atherosclerosis. Reproductive: Prior hysterectomy noted. Adnexal regions are unremarkable in appearance. Other:  None. Musculoskeletal:  No suspicious bone lesions identified. IMPRESSION: No acute findings in the abdomen or pelvis. Stable findings of chronic sclerosing mesenteritis. Electronically Signed   By: Earle Gell M.D.   On: 07/05/2017 17:32   Dg C-arm 1-60 Min  Result Date: 07/08/2017 CLINICAL DATA:  77 year old female with a history of open reduction internal fixation right femur EXAM: RIGHT FEMUR 2 VIEWS; DG C-ARM 61-120 MIN COMPARISON:  07/07/2017 FINDINGS: Limited intraoperative fluoroscopic spot images of the right femur during ORIF demonstrating antegrade intramedullary rod with cannulated screw fixation of the right femoral neck. Improved alignment at the fracture site. IMPRESSION: Limited intraoperative fluoroscopic spot images during right femur ORIF, as above. Please refer to the dictated operative report for full details of intraoperative findings and procedure. Electronically Signed   By: Corrie Mckusick D.O.   On: 07/08/2017 09:59   Dg Hip Unilat W Or Wo Pelvis 2-3 Views Right  Result Date: 07/07/2017 CLINICAL DATA:  Fall from ladder 3-4 feet.  Right hip pain. EXAM: DG HIP (WITH OR WITHOUT PELVIS) 2-3V RIGHT COMPARISON:  CT of the abdomen and pelvis 07/05/2017. FINDINGS: A right intratrochanteric fracture is present. There is displacement medially of the lesser trochanter. Femoral head is located. Visualized pelvis is intact.  Proximal left femur is unremarkable.  IMPRESSION: Intertrochanteric fracture of the proximal right femur with mild displacement. Electronically Signed   By: San Morelle M.D.   On: 07/07/2017 18:54   Dg Femur, Min 2 Views Right  Result Date: 07/08/2017 CLINICAL DATA:  77 year old female with a history of open reduction internal fixation right femur EXAM: RIGHT FEMUR 2 VIEWS; DG C-ARM 61-120 MIN COMPARISON:  07/07/2017 FINDINGS: Limited intraoperative fluoroscopic spot images of the right femur during ORIF demonstrating antegrade intramedullary rod with cannulated screw fixation of the right femoral neck. Improved alignment at the fracture site. IMPRESSION: Limited intraoperative fluoroscopic spot images during right femur ORIF, as above. Please refer to the dictated operative report for full details of intraoperative findings and procedure. Electronically Signed   By: Corrie Mckusick D.O.   On: 07/08/2017 09:59    Lab Data:  CBC: Recent Labs  Lab 07/07/17 1849 07/08/17 1045 07/09/17 0315  WBC 13.7* 14.6* 9.9  NEUTROABS 11.8*  --   --   HGB 13.3 12.4 10.4*  HCT 40.9 38.6 33.0*  MCV 88.9 90.2 89.9  PLT 255 220 758   Basic Metabolic Panel: Recent Labs  Lab 07/07/17 1849 07/08/17 1045 07/09/17 0315  NA 140  --  140  K 3.6  --  3.6  CL 104  --  103  CO2 22  --  30  GLUCOSE 111*  --  130*  BUN 20  --  13  CREATININE 0.83 0.73 0.69  CALCIUM 9.3  --  8.5*   GFR: Estimated Creatinine Clearance: 53.8 mL/min (by C-G formula based on SCr of 0.69 mg/dL). Liver Function Tests: No results for input(s): AST, ALT, ALKPHOS, BILITOT, PROT, ALBUMIN in the last 168 hours. No results for input(s): LIPASE, AMYLASE in the last 168 hours. No results for input(s): AMMONIA in the last 168 hours. Coagulation Profile: Recent Labs  Lab 07/07/17 2255  INR 1.06   Cardiac Enzymes: No results for input(s): CKTOTAL, CKMB, CKMBINDEX, TROPONINI in the last 168 hours. BNP (last 3 results) No results for input(s): PROBNP in the last  8760 hours. HbA1C: No results for input(s): HGBA1C in the last 72 hours. CBG: No results for input(s): GLUCAP in the last 168 hours. Lipid Profile: No results for input(s): CHOL, HDL, LDLCALC, TRIG, CHOLHDL, LDLDIRECT in the last 72 hours. Thyroid Function Tests: No results for input(s): TSH, T4TOTAL, FREET4, T3FREE, THYROIDAB in the last 72 hours. Anemia Panel: No results for input(s): VITAMINB12, FOLATE, FERRITIN, TIBC, IRON, RETICCTPCT in the last 72 hours.  Urine analysis:    Component Value Date/Time   COLORURINE YELLOW 03/11/2013 1234   APPEARANCEUR CLEAR 03/11/2013 1234   APPEARANCEUR Clear 12/31/2012 0516   LABSPEC 1.006 03/11/2013 1234   LABSPEC 1.023 12/31/2012 0516   PHURINE 7.5 03/11/2013 1234   GLUCOSEU NEGATIVE 03/11/2013 1234   GLUCOSEU Negative 12/31/2012 0516   HGBUR NEGATIVE 03/11/2013 1234   BILIRUBINUR Negative 08/21/2014 1258   BILIRUBINUR Negative 12/31/2012 0516   KETONESUR NEGATIVE 03/11/2013 1234   PROTEINUR Negative 08/21/2014 1258   PROTEINUR NEGATIVE 03/11/2013 1234   UROBILINOGEN 0.2 08/21/2014 1258   UROBILINOGEN 0.2 03/11/2013 1234   NITRITE Negative 08/21/2014 1258   NITRITE NEGATIVE 03/11/2013 1234   LEUKOCYTESUR Negative 08/21/2014 1258   LEUKOCYTESUR Negative 12/31/2012 0516     Benito Mccreedy M.D. [facility] 07/09/2017, 2:08 PM  Pager: [phone] Between 7am to 7pm - call Pager - [phone]  After 7pm go to [email or web address] - password [email or web address]  Call night coverage person covering after 7pm

## 2017-07-09 NOTE — Evaluation (Signed)
Physical Therapy Evaluation Patient Details Name: Kristin Kramer MRN: [id number] DOB: 1941-02-25 Today's Date: 07/09/2017   History of Present Illness  77 year old lady with medical history significant for depression and dementia presenting to the ED after mechanical fall and admitted for right intertrochanteric closed fracture  Clinical Impression   Patient is s/p above surgery resulting in functional limitations due to the deficits listed below (see PT Problem List). Lives alone, independent prior to admission; Presents with pain, decr activity tolerance, and decr functional mobility; Excellent participation, and I anticipate good progress; Recommending SNF for rehab to maximize independence and safety with mobility because pt lives alone (likely a short SNF stay) -- she tells me she had good rehab outcomes going to SNF after her TKAs;   Patient will benefit from skilled PT to increase their independence and safety with mobility to allow discharge to the venue listed below.       Follow Up Recommendations SNF    Equipment Recommendations  3in1 (PT)    Recommendations for Other Services       Precautions / Restrictions Precautions Precautions: Fall Restrictions Weight Bearing Restrictions: No RLE Weight Bearing: Weight bearing as tolerated      Mobility  Bed Mobility Overal bed mobility: Needs Assistance Bed Mobility: Supine to Sit     Supine to sit: Min guard     General bed mobility comments: Cues for technique; used rails, HOB elevated; minguard for safety  Transfers Overall transfer level: Needs assistance Equipment used: Rolling walker (2 wheeled) Transfers: Sit to/from Stand Sit to Stand: Min assist         General transfer comment: Cues for hand placement and safety; Noted she is dependent on UEs to push up and support; min assist to steady when moving arms from bed to RW  Ambulation/Gait Ambulation/Gait assistance: Min guard(with and without physical  contact) Ambulation Distance (Feet): 80 Feet Assistive device: Rolling walker (2 wheeled) Gait Pattern/deviations: Step-through pattern;Decreased stance time - right     General Gait Details: Cues for gait sequence and more efficient step length; Heavy dependence on  RW support to unweigh painful RLE in stance  Stairs            Wheelchair Mobility    Modified Rankin (Stroke Patients Only)       Balance Overall balance assessment: Needs assistance   Sitting balance-Leahy Scale: Good       Standing balance-Leahy Scale: Poor(approaching Fair)                               Pertinent Vitals/Pain Pain Assessment: 0-10 Pain Score: 3  Pain Location: R hip Pain Descriptors / Indicators: Aching;Sore Pain Intervention(s): Monitored during session;RN gave pain meds during session    [place] expects to be discharged to:: Private residence Living Arrangements: Alone Available Help at Discharge: Available PRN/intermittently;Neighbor(no consistent assistance available) Type of Home: (Condominium) Home Access: Level entry     Home Layout: One level Home Equipment: Shower seat - built in;Walker - 2 wheels      Prior Function Level of Independence: Independent         Comments: Active in community; enjoys going to the gym     Hand Dominance        Extremity/Trunk Assessment   Upper Extremity Assessment Upper Extremity Assessment: Overall WFL for tasks assessed    Lower Extremity Assessment Lower Extremity Assessment: RLE deficits/detail RLE Deficits / Details: Grossly  decr AROM and strength, limited by pain postop    Cervical / Trunk Assessment Cervical / Trunk Assessment: Normal  Communication   Communication: No difficulties  Cognition Arousal/Alertness: Awake/alert Behavior During Therapy: WFL for tasks assessed/performed Overall Cognitive Status: Within Functional Limits for tasks assessed(for simple mobility tasks)                                  General Comments: Noted dementia in her PMH, however no gross cognitive deficiits that effected treatment today      General Comments General comments (skin integrity, edema, etc.): Noted tendency for O2 sats to drop when on Room Air; 84% observed at the lowest; Restarted supplemental O2 at end of session, and encouraged incentive spirometer    Exercises     Assessment/Plan    PT Assessment Patient needs continued PT services  PT Problem List Decreased strength;Decreased range of motion;Decreased activity tolerance;Decreased balance;Decreased mobility;Decreased knowledge of use of DME;Decreased safety awareness;Decreased knowledge of precautions;Cardiopulmonary status limiting activity;Pain       PT Treatment Interventions DME instruction;Gait training;Stair training;Functional mobility training;Therapeutic activities;Therapeutic exercise;Patient/family education    PT Goals (Current goals can be found in the Care Plan section)  Acute Rehab PT Goals Patient Stated Goal: Back to her home and being independent PT Goal Formulation: With patient Potential to Achieve Goals: Good    Frequency Min 3X/week   Barriers to discharge Decreased caregiver support Recommending SNF for post-acute rehab to reach moidified independence with mobility as pt lives alone    Co-evaluation               AM-PAC PT "6 Clicks" Daily Activity  Outcome Measure Difficulty turning over in bed (including adjusting bedclothes, sheets and blankets)?: A Little Difficulty moving from lying on back to sitting on the side of the bed? : A Lot Difficulty sitting down on and standing up from a chair with arms (e.g., wheelchair, bedside commode, etc,.)?: A Little Help needed moving to and from a bed to chair (including a wheelchair)?: A Little Help needed walking in hospital room?: A Little Help needed climbing 3-5 steps with a railing? : A Lot 6 Click Score: 16     End of Session Equipment Utilized During Treatment: Gait belt Activity Tolerance: Patient tolerated treatment well Patient left: in chair;with call bell/phone within reach;with chair alarm set Nurse Communication: Mobility status PT Visit Diagnosis: Other abnormalities of gait and mobility (R26.89);Pain Pain - Right/Left: Right Pain - part of body: Hip    Time: 1610-9604 PT Time Calculation (min) (ACUTE ONLY): 35 min   Charges:   PT Evaluation $PT Eval Low Complexity: 1 Low PT Treatments $Gait Training: 8-22 mins   PT G Codes:        Roney [place], PT  Acute Rehabilitation Services Pager [phone] Office [phone]   [place] 07/09/2017, 9:14 AM

## 2017-07-10 ENCOUNTER — Encounter (HOSPITAL_COMMUNITY): Payer: Self-pay | Admitting: Orthopaedic Surgery

## 2017-07-10 ENCOUNTER — Encounter
Admission: RE | Admit: 2017-07-10 | Discharge: 2017-07-10 | Disposition: A | Discharge status: Home / Self Care | Payer: Medicare Other | Source: Ambulatory Visit | Attending: Internal Medicine | Admitting: Internal Medicine

## 2017-07-10 DIAGNOSIS — S72144A Nondisplaced intertrochanteric fracture of right femur, initial encounter for closed fracture: Secondary | ICD-10-CM | POA: Diagnosis not present

## 2017-07-10 DIAGNOSIS — M810 Age-related osteoporosis without current pathological fracture: Secondary | ICD-10-CM | POA: Diagnosis not present

## 2017-07-10 DIAGNOSIS — Z22321 Carrier or suspected carrier of Methicillin susceptible Staphylococcus aureus: Secondary | ICD-10-CM | POA: Diagnosis not present

## 2017-07-10 DIAGNOSIS — Z9889 Other specified postprocedural states: Secondary | ICD-10-CM | POA: Diagnosis not present

## 2017-07-10 DIAGNOSIS — G309 Alzheimer's disease, unspecified: Secondary | ICD-10-CM | POA: Diagnosis not present

## 2017-07-10 DIAGNOSIS — M6281 Muscle weakness (generalized): Secondary | ICD-10-CM | POA: Diagnosis not present

## 2017-07-10 DIAGNOSIS — K654 Sclerosing mesenteritis: Secondary | ICD-10-CM | POA: Diagnosis not present

## 2017-07-10 DIAGNOSIS — Z9181 History of falling: Secondary | ICD-10-CM | POA: Diagnosis not present

## 2017-07-10 DIAGNOSIS — S72001A Fracture of unspecified part of neck of right femur, initial encounter for closed fracture: Secondary | ICD-10-CM | POA: Diagnosis not present

## 2017-07-10 DIAGNOSIS — R413 Other amnesia: Secondary | ICD-10-CM | POA: Diagnosis not present

## 2017-07-10 DIAGNOSIS — M8580 Other specified disorders of bone density and structure, unspecified site: Secondary | ICD-10-CM | POA: Diagnosis not present

## 2017-07-10 DIAGNOSIS — F331 Major depressive disorder, recurrent, moderate: Secondary | ICD-10-CM

## 2017-07-10 DIAGNOSIS — F0280 Dementia in other diseases classified elsewhere without behavioral disturbance: Secondary | ICD-10-CM

## 2017-07-10 DIAGNOSIS — J302 Other seasonal allergic rhinitis: Secondary | ICD-10-CM | POA: Diagnosis not present

## 2017-07-10 DIAGNOSIS — S72141D Displaced intertrochanteric fracture of right femur, subsequent encounter for closed fracture with routine healing: Secondary | ICD-10-CM | POA: Diagnosis not present

## 2017-07-10 DIAGNOSIS — E559 Vitamin D deficiency, unspecified: Secondary | ICD-10-CM | POA: Diagnosis not present

## 2017-07-10 DIAGNOSIS — S72001S Fracture of unspecified part of neck of right femur, sequela: Secondary | ICD-10-CM | POA: Diagnosis not present

## 2017-07-10 DIAGNOSIS — M50923 Unspecified cervical disc disorder at C6-C7 level: Secondary | ICD-10-CM | POA: Diagnosis not present

## 2017-07-10 DIAGNOSIS — F325 Major depressive disorder, single episode, in full remission: Secondary | ICD-10-CM | POA: Diagnosis not present

## 2017-07-10 DIAGNOSIS — R262 Difficulty in walking, not elsewhere classified: Secondary | ICD-10-CM | POA: Diagnosis not present

## 2017-07-10 LAB — BASIC METABOLIC PANEL
ANION GAP: 8 (ref 5–15)
BUN: 13 mg/dL (ref 6–20)
CO2: 29 mmol/L (ref 22–32)
Calcium: 8.4 mg/dL — ABNORMAL LOW (ref 8.9–10.3)
Chloride: 106 mmol/L (ref 101–111)
Creatinine, Ser: 0.68 mg/dL (ref 0.44–1.00)
GFR calc non Af Amer: >60 mL/min (ref >60)
Glucose, Bld: 107 mg/dL — ABNORMAL HIGH (ref 65–99)
Potassium: 3.8 mmol/L (ref 3.5–5.1)
Sodium: 143 mmol/L (ref 135–145)

## 2017-07-10 LAB — CBC
HEMATOCRIT: 30.1 % — AB (ref 36.0–46.0)
Hemoglobin: 9.5 g/dL — ABNORMAL LOW (ref 12.0–15.0)
MCH: 28.3 pg (ref 26.0–34.0)
MCHC: 31.6 g/dL (ref 30.0–36.0)
MCV: 89.6 fL (ref 78.0–100.0)
Platelets: 194 10*3/uL (ref 150–400)
RBC: 3.36 MIL/uL — ABNORMAL LOW (ref 3.87–5.11)
RDW: 13.3 % (ref 11.5–15.5)
WBC: 7.2 10*3/uL (ref 4.0–10.5)

## 2017-07-10 MED ORDER — METHOCARBAMOL 500 MG PO TABS: 500.0000 mg | ORAL_TABLET | Freq: Three times a day (TID) | ORAL | 0 refills | Status: DC | PRN

## 2017-07-10 MED ORDER — ONDANSETRON HCL 4 MG PO TABS: 4.0000 mg | ORAL_TABLET | Freq: Four times a day (QID) | ORAL | 0 refills | Status: DC | PRN

## 2017-07-10 MED ORDER — MUPIROCIN 2 % EX OINT: 1.0000 "application " | TOPICAL_OINTMENT | Freq: Two times a day (BID) | CUTANEOUS | 0 refills | Status: DC

## 2017-07-10 MED ORDER — DOCUSATE SODIUM 100 MG PO CAPS: 100.0000 mg | ORAL_CAPSULE | Freq: Two times a day (BID) | ORAL | 0 refills | Status: DC

## 2017-07-10 MED ORDER — VITAMIN D3 125 MCG (5000 UT) PO CAPS: 1.0000 | ORAL_CAPSULE | Freq: Every day | ORAL | Status: DC

## 2017-07-10 MED ORDER — CELECOXIB 200 MG PO CAPS
200.0000 mg | ORAL_CAPSULE | Freq: Two times a day (BID) | ORAL | 0 refills | Status: DC
Start: 2017-07-10 — End: 2017-08-09

## 2017-07-10 NOTE — Progress Notes (Signed)
Report called to receiving facility [facility].  All patients medications and belongs sent with patient out of room.  Son at bedside for transfer to facility.  Discharge instructions discussed with patient and facility.

## 2017-07-10 NOTE — Clinical Social Work Placement (Signed)
   CLINICAL SOCIAL WORK PLACEMENT  NOTE  Date:  07/10/2017  Patient Details  Name: LOANA SALVAGGIO MRN: [id number] Date of Birth: 1940/06/18  Clinical Social Work is seeking post-discharge placement for this patient at the [place] level of care (*CSW will initial, date and re-position this form in  chart as items are completed):      Patient/family provided with [facility] Work Department's list of facilities offering this level of care within the geographic area requested by the patient (or if unable, by the patient's family).  Yes   Patient/family informed of their freedom to choose among providers that offer the needed level of care, that participate in Medicare, Medicaid or managed care program needed by the patient, have an available bed and are willing to accept the patient.      Patient/family informed of [facility]'s ownership interest in [facility] and [facility], as well as of the fact that they are under no obligation to receive care at these facilities.  PASRR submitted to EDS on       PASRR number received on 07/09/17     Existing PASRR number confirmed on 07/09/17     FL2 transmitted to all facilities in geographic area requested by pt/family on 07/09/17     FL2 transmitted to all facilities within larger geographic area on       Patient informed that his/her managed care company has contracts with or will negotiate with certain facilities, including the following:        Yes   Patient/family informed of bed offers received.  Patient chooses bed at [facility]     Physician recommends and patient chooses bed at      Patient to be transferred to [facility] on 07/10/17.  Patient to be transferred to facility by Son     Patient family notified on 07/10/17 of transfer.  Name of family member notified:  Son present at bedside     PHYSICIAN       Additional Comment:     _______________________________________________ Eileen Stanford, LCSW 07/10/2017, 1:44 PM

## 2017-07-10 NOTE — Clinical Social Work Note (Addendum)
CSW spoke with [facility]--pt's first choice, and they can take the pt today if pt is medically stable for d/c. Pt's son will transport.  [place], [place]

## 2017-07-10 NOTE — Plan of Care (Signed)
Patient out of bed to chair, pain well controlled with tylenol.  Plan for d/c today to facility

## 2017-07-10 NOTE — Discharge Summary (Signed)
Physician Discharge Summary  Patient ID: EVI [id number] MRN: [id number] DOB/AGE: 1940-04-08 77 y.o.  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  Patient was admitted with right intertrochanteric closed fracture following a fall.  Patient was admitted to the hospitalist team for further assessment and management.  Orthopedic consult was called.  Patient eventually underwent surgery by the orthopedic team (intramedullary implant for right intratrochanteric fracture).  Patient has been cleared for discharge by the orthopedic team.  Other chronic medical problems from managed supportively.  Patient is stable for discharge.  Patient will be discharged to facility for rehabilitation.  Consults: orthopedic surgery  Significant Diagnostic Studies: X-ray:Intertrochanteric fracture of the proximal right femur with mild displacement  Treatments: surgery: Treatment of intertrochanteric fracture with intramedullary implant. CPT [phone]   Discharge Exam: Blood pressure 117/62, pulse 63, temperature 98.5 F (36.9 C), temperature source Oral, resp. rate 16, SpO2 98 %.   Disposition: Discharge disposition: 03-Skilled Nursing Facility       Discharge Instructions    Call MD for:   Complete by:  As directed    Please call MD if there is worsening of symptoms   Diet - low sodium heart healthy   Complete by:  As directed    Increase activity slowly   Complete by:  As directed    Activity as per the rehab team.   Weight bearing as tolerated   Complete by:  As directed      Allergies as of 07/10/2017      Reactions   Lipitor  [atorvastatin] Other (See Comments)   Cramps   Pravastatin Other (See Comments)   Cramps       Medication List    STOP taking these medications   acetaminophen 325 MG tablet Commonly known as:  TYLENOL   BIOTIN 5000 5 MG Caps Generic drug:  Biotin   calcium carbonate 600 MG Tabs tablet Commonly known as:  OS-CAL   COQ-10 PO   Fish Oil 1000 MG Caps   Potassium 99 MG Tabs   TURMERIC CURCUMIN PO     TAKE these medications   celecoxib 200 MG capsule Commonly known as:  CELEBREX Take 1 capsule (200 mg total) by mouth 2 (two) times daily.   docusate sodium 100 MG capsule Commonly known as:  COLACE Take 1 capsule (100 mg total) by mouth 2 (two) times daily.   donepezil 5 MG tablet Commonly known as:  ARICEPT Take 1 tablet (5 mg total) by mouth daily. What changed:  when to take this   enoxaparin 40 MG/0.4ML injection Commonly known as:  LOVENOX Inject 0.4 mLs (40 mg total) into the skin daily.   HYDROcodone-acetaminophen 7.5-325 MG tablet Commonly known as:  NORCO Take 1-2 tablets by mouth every 6 (six) hours as needed for moderate pain.   loratadine 10 MG tablet Commonly known as:  CLARITIN Take 1 tablet (10 mg total) by mouth daily.   methocarbamol 500 MG tablet Commonly known as:  ROBAXIN Take 1 tablet (500 mg total) by mouth every 8 (eight) hours as needed for muscle spasms.   metroNIDAZOLE 0.75 % cream Commonly known as:  METROCREAM Apply 1 application topically 2 (two) times  daily. Applied daily to patient face   montelukast 10 MG tablet Commonly known as:  SINGULAIR Take 1 tablet (10 mg total) by mouth at bedtime.   mupirocin ointment 2 % Commonly known as:  BACTROBAN Place 1 application into the nose 2 (two) times daily for 3 days.   niacinamide 500 MG tablet Take 500 mg by mouth daily. Per dermatology   ondansetron 4 MG tablet Commonly known as:  ZOFRAN Take 1 tablet (4 mg total) by mouth every 6 (six) hours as needed for nausea.    PROBIOTIC PO Take 1 capsule by mouth daily.   ranitidine 150 MG tablet Commonly known as:  ZANTAC Take 150 mg by mouth 2 (two) times daily as needed. For acid reflux/indigestion/GERD   sertraline 25 MG tablet Commonly known as:  ZOLOFT ALTERNATE 1 TABLET AND 1.5 TABLETS EVERY OTHER DAY What changed:  See the new instructions.   vitamin B-12 1000 MCG tablet Commonly known as:  CYANOCOBALAMIN Take 1,000 mcg by mouth daily.   Vitamin D3 5000 units Caps Take 1 capsule (5,000 Units total) by mouth daily.            Discharge Care Instructions  (From admission, onward)        Start     Ordered   07/08/17 0000  Weight bearing as tolerated     07/08/17 6945     Follow-up Information    Leandrew Koyanagi, MD In 2 weeks.   Specialty:  Orthopedic Surgery Why:  For suture removal, For wound re-check Contact information: [address] [place] [phone]           Signed: Bonnell Public 07/10/2017, 1:13 PM

## 2017-07-10 NOTE — Progress Notes (Signed)
Subjective: 2 Days Post-Op Procedure(s) (LRB): RIGHT INTERTROCHANTRIC IM NAIL (Right) Patient reports pain as mild.  Doing well this am without any complaints  Objective: Vital signs in last 24 hours: Temp:  [98.5 F (36.9 C)-98.9 F (37.2 C)] 98.5 F (36.9 C) (05/06 0416) Pulse Rate:  [56-67] 63 (05/06 0416) BP: (98-124)/(48-62) 117/62 (05/06 0416) SpO2:  [95 %-100 %] 98 % (05/06 0416)  Intake/Output from previous day: 05/05 0701 - 05/06 0700 In: 14045.4 [P.O.:720; I.V.:13325.4] Out: -  Intake/Output this shift: No intake/output data recorded.  Recent Labs    07/07/17 1849 07/08/17 1045 07/09/17 0315 07/10/17 0319  HGB 13.3 12.4 10.4* 9.5*   Recent Labs    07/09/17 0315 07/10/17 0319  WBC 9.9 7.2  RBC 3.67* 3.36*  HCT 33.0* 30.1*  PLT 207 194   Recent Labs    07/09/17 0315 07/10/17 0319  NA 140 143  K 3.6 3.8  CL 103 106  CO2 30 29  BUN 13 13  CREATININE 0.69 0.68  GLUCOSE 130* 107*  CALCIUM 8.5* 8.4*   Recent Labs    07/07/17 2255  INR 1.06    Neurologically intact Neurovascular intact Sensation intact distally Intact pulses distally Dorsiflexion/Plantar flexion intact Incision: dressing C/D/I No cellulitis present Compartment soft    Assessment/Plan: 2 Days Post-Op Procedure(s) (LRB): RIGHT INTERTROCHANTRIC IM NAIL (Right) Up with therapy  WBAT RLE ABLA-mild and stable Continue plan per [place] 07/10/2017, 8:07 AM

## 2017-07-10 NOTE — Clinical Social Work Note (Signed)
Clinical Social Worker facilitated patient discharge including contacting patient family and facility to confirm patient discharge plans.  Clinical information faxed to facility and family agreeable with plan.  Pt's son will transport pt to facility. Pt will go to [facility]. Pt will go to room 212 .  RN to call [phone] for report prior to discharge.  Clinical Social Worker will sign off for now as social work intervention is no longer needed. Please consult [place] again if new need arises.  Ama, [place]

## 2017-07-11 DIAGNOSIS — S72144A Nondisplaced intertrochanteric fracture of right femur, initial encounter for closed fracture: Secondary | ICD-10-CM | POA: Diagnosis not present

## 2017-07-11 DIAGNOSIS — F325 Major depressive disorder, single episode, in full remission: Secondary | ICD-10-CM | POA: Diagnosis not present

## 2017-07-12 ENCOUNTER — Other Ambulatory Visit: Payer: Self-pay

## 2017-07-12 MED ORDER — HYDROCODONE-ACETAMINOPHEN 7.5-325 MG PO TABS: 1.0000 | ORAL_TABLET | Freq: Four times a day (QID) | ORAL | 0 refills | Status: DC | PRN

## 2017-07-12 NOTE — Telephone Encounter (Signed)
Rx sent to [facility] phone : [phone] , fax : [phone]  

## 2017-07-14 ENCOUNTER — Non-Acute Institutional Stay (SKILLED_NURSING_FACILITY): Payer: Medicare Other | Admitting: Gerontology

## 2017-07-14 ENCOUNTER — Other Ambulatory Visit: Payer: Self-pay | Admitting: Family Medicine

## 2017-07-14 ENCOUNTER — Encounter: Payer: Self-pay | Admitting: Gerontology

## 2017-07-14 DIAGNOSIS — S72001S Fracture of unspecified part of neck of right femur, sequela: Secondary | ICD-10-CM

## 2017-07-14 DIAGNOSIS — Z9889 Other specified postprocedural states: Secondary | ICD-10-CM

## 2017-07-14 NOTE — Progress Notes (Signed)
Location:   [facility] Room Number: 212A Place of Service:  SNF [phone])  Provider: Toni Arthurs, NP-C  PCP: [name], MD Patient Care Team: [name], MD as PCP - General (Family Medicine) [name], [place] as Consulting Physician (Optometry)  Extended Emergency Contact Information Primary Emergency Contact: Reynolds,Charles & Zigmund Daniel Address: [place], [place] Mobile Phone: [phone] Relation: Brother  Code Status: FULL Goals of care:  Advanced Directive information Advanced Directives 07/14/2017  Does Patient Have a Medical Advance Directive? No  Type of Advance Directive -  Does patient want to make changes to medical advance directive? -  Copy of [place] in Chart? -     Allergies  Allergen Reactions  . Lipitor [Atorvastatin] Other (See Comments)    Cramps  . Pravastatin Other (See Comments)    Cramps     Chief Complaint  Patient presents with  . Discharge Note    Discharged from SNF    HPI:  77 y.o. female  Seen today for discharge evaluation. Pt was admitted to the facility for rehab following hospitalization for right intertrochanteric hip fracture after a fall at home. Pt underwent IM nailing. Pt has been participating in PT/OT. Pt has been progressing well. Pt reports her pain is well controlled on current regimen. Pt reports her appetite is good, she is voiding well and having regular BMs. Pt has no concerns at this time about going home. VSS. No other complaints.     Past Medical History:  Diagnosis Date  . Abdominal pain, other specified site 10.2014   [facility] ER - mesenteric adenitis vs sclerosing mesenteritis vs nonspecific lymphadenitis  . Abnormal ECG    a. left axis deviation;  b. 12/2011 Echo: EF 55-60%, no rwma, pasp 71mmHg.  . Arthritis   . Depression    prior on lexapro, celexa, wellbutrin.  high cymbalta doses cause tremors  . H/O  seasonal allergies   . History of pneumonia   . History of UTI    12/2011 - Treatment began preoperatively with CIPRO 500mg  BID  . HLD (hyperlipidemia)    statin intolerance  . Knee osteoarthritis 2013, 2014   bilateral s/p B TKR Noemi [facility])  . Knee osteoarthritis   . 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 Migraines   . Osteopenia 08/2016   hip -2.4, spine -1.4  . Post-operative nausea and vomiting   . Refusal of blood transfusions as patient is Jehovah's Witness   . Seasonal allergies    cats, dust, mold, roaches  . 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 Isac Sarna), R tibia - then recurrent  . Vitamin D deficiency     Past Surgical History:  Procedure Laterality Date  . BREAST ENHANCEMENT SURGERY    . BUNIONECTOMY  2013   R and L foot  . CATARACT EXTRACTION  2013   R, pending L  . COLONOSCOPY  2007   no records received  . COLONOSCOPY  08/2014   tubular adenoma, rpt 5 yrs [facility])  . COSMETIC SURGERY  2002   face lift  . dexa  06/2008   T -1.4 spine and hip  . ETHMOIDECTOMY Bilateral 05/19/2016   Procedure: ETHMOIDECTOMY;  Surgeon: Carloyn Manner, MD;  Location: [facility] ORS;  Service:  ENT;  Laterality: Bilateral;  . EYE SURGERY     bilateral cataract surgery  . FRONTAL SINUS EXPLORATION Bilateral 05/19/2016   Procedure: FRONTAL SINUS EXPLORATION;  Surgeon: Carloyn Manner, MD;  Location: [facility] ORS;  Service: ENT;  Laterality: Bilateral;  . INTRAMEDULLARY (IM) NAIL INTERTROCHANTERIC Right 07/08/2017   Procedure: RIGHT INTERTROCHANTRIC IM NAIL;  Surgeon: Leandrew Koyanagi, MD;  Location: [facility];  Service: Orthopedics;  Laterality: Right;  . JOINT REPLACEMENT Bilateral 2015   knees  . MAXILLARY ANTROSTOMY Bilateral 05/19/2016   Procedure: MAXILLARY ANTROSTOMY;  Surgeon: Carloyn Manner, MD;  Location: [facility] ORS;  Service: ENT;  Laterality: Bilateral;    . MOHS SURGERY  2011   R leg  . PARTIAL HYSTERECTOMY  1980   fibroids, ovaries remained  . SINUS ENDO W/FUSION N/A 05/19/2016   Procedure: ENDOSCOPIC SINUS SURGERY WITH NAVIGATION;  Surgeon: Carloyn Manner, MD;  Location: [facility] ORS;  Service: ENT;  Laterality: N/A;  . SPHENOIDECTOMY Bilateral 05/19/2016   Procedure: Coralee Pesa;  Surgeon: Carloyn Manner, MD;  Location: [facility] ORS;  Service: ENT;  Laterality: Bilateral;  . SQUAMOUS CELL CARCINOMA EXCISION Right 07/13/2016   Dr. Rolm Bookbinder, [facility]  . TONSILLECTOMY AND ADENOIDECTOMY  1945  . TOTAL KNEE ARTHROPLASTY  01/02/2012   Right - Surgeon: Lorn Junes, MD  . TOTAL KNEE ARTHROPLASTY Left 03/18/2013   Surgeon: Lorn Junes, MD      reports that she has never smoked. She has never used smokeless tobacco. She reports that she drinks alcohol. She reports that she does not use drugs. Social History   Socioeconomic History  . Marital status: Widowed    Spouse name: Not on file  . Number of children: 3  . Years of education: Not on file  . Highest education level: Not on file  Occupational History  . Occupation: Retired  Scientific laboratory technician  . Financial resource strain: Not on file  . Food insecurity:    Worry: Not on file    Inability: Not on file  . Transportation needs:    Medical: Not on file    Non-medical: Not on file  Tobacco Use  . Smoking status: Never Smoker  . Smokeless tobacco: Never Used  Substance and Sexual Activity  . Alcohol use: Yes    Alcohol/week: 0.0 oz    Comment: occasional // 5-6 drinks per month  . Drug use: No  . Sexual activity: Never  Lifestyle  . Physical activity:    Days per week: Not on file    Minutes per session: Not on file  . Stress: Not on file  Relationships  . Social connections:    Talks on phone: Not on file    Gets together: Not on file    Attends religious service: Not on file    Active member of club or organization: Not on file    Attends meetings of  clubs or organizations: Not on file    Relationship status: Not on file  . Intimate partner violence:    Fear of current or ex partner: Not on file    Emotionally abused: Not on file    Physically abused: Not on file    Forced sexual activity: Not on file  Other Topics Concern  . Not on file  Social History Narrative   Lives alone.  Brother lives nearby.   Occupation: retired, was Network engineer and housewife   Edu: 1 yr college   Activity: limited by knee   Diet: good water, fruits/vegetables daily  Religion: Jehova's witness   Functional Status Survey:    Allergies  Allergen Reactions  . Lipitor [Atorvastatin] Other (See Comments)    Cramps  . Pravastatin Other (See Comments)    Cramps     Pertinent  Health Maintenance Due  Topic Date Due  . INFLUENZA VACCINE  10/05/2017  . MAMMOGRAM  02/15/2018  . COLONOSCOPY  08/28/2019  . DEXA SCAN  Completed  . PNA vac Low Risk Adult  Completed    Medications: Allergies as of 07/14/2017      Reactions   Lipitor [atorvastatin] Other (See Comments)   Cramps   Pravastatin Other (See Comments)   Cramps       Medication List        Accurate as of 07/14/17 11:10 AM. Always use your most recent med list.          celecoxib 200 MG capsule Commonly known as:  CELEBREX Take 1 capsule (200 mg total) by mouth 2 (two) times daily.   docusate sodium 100 MG capsule Commonly known as:  COLACE Take 1 capsule (100 mg total) by mouth 2 (two) times daily.   donepezil 5 MG tablet Commonly known as:  ARICEPT Take 1 tablet (5 mg total) by mouth daily.   enoxaparin 40 MG/0.4ML injection Commonly known as:  LOVENOX Inject 0.4 mLs (40 mg total) into the skin daily.   HYDROcodone-acetaminophen 7.5-325 MG tablet Commonly known as:  NORCO Take 1-2 tablets by mouth every 6 (six) hours as needed for moderate pain.   loratadine 10 MG tablet Commonly known as:  CLARITIN Take 1 tablet (10 mg total) by mouth daily.   methocarbamol 500 MG  tablet Commonly known as:  ROBAXIN Take 1 tablet (500 mg total) by mouth every 8 (eight) hours as needed for muscle spasms.   metroNIDAZOLE 0.75 % cream Commonly known as:  METROCREAM Apply 1 application topically 2 (two) times daily. Applied daily to patient face   montelukast 10 MG tablet Commonly known as:  SINGULAIR Take 1 tablet (10 mg total) by mouth at bedtime.   niacinamide 500 MG tablet Take 500 mg by mouth daily. Per dermatology   ondansetron 4 MG tablet Commonly known as:  ZOFRAN Take 1 tablet (4 mg total) by mouth every 6 (six) hours as needed for nausea.   pantoprazole 20 MG tablet Commonly known as:  PROTONIX Take 20 mg by mouth every other day. While on Celebrex and lovenox, does not need at dc   PROBIOTIC PO Take 1 capsule by mouth daily.   ranitidine 150 MG tablet Commonly known as:  ZANTAC Take 150 mg by mouth 2 (two) times daily as needed. For acid reflux/indigestion/GERD   sertraline 25 MG tablet Commonly known as:  ZOLOFT Take 25 mg by mouth every other day. alternate with 1.5 tabs (37.5 mg)  every other day   sertraline 25 MG tablet Commonly known as:  ZOLOFT Take 37.5 mg by mouth every other day. alternate with 1 tab (25 mg) daily   vitamin B-12 1000 MCG tablet Commonly known as:  CYANOCOBALAMIN Take 1,000 mcg by mouth daily.   Vitamin D-3 5000 units Tabs Take 1 tablet by mouth daily.       Review of Systems  Constitutional: Negative for activity change, appetite change, chills, diaphoresis and fever.  HENT: Negative for congestion, mouth sores, nosebleeds, postnasal drip, sneezing, sore throat, trouble swallowing and voice change.   Respiratory: Negative for apnea, cough, choking, chest tightness, shortness of breath and wheezing.  Cardiovascular: Negative for chest pain, palpitations and leg swelling.  Gastrointestinal: Negative for abdominal distention, abdominal pain, constipation, diarrhea and nausea.  Genitourinary: Negative for  difficulty urinating, dysuria, frequency and urgency.  Musculoskeletal: Positive for arthralgias (typical arthritis) and gait problem. Negative for back pain and myalgias.  Skin: Positive for wound. Negative for color change, pallor and rash.  Neurological: Positive for weakness. Negative for dizziness, tremors, syncope, speech difficulty, numbness and headaches.  Psychiatric/Behavioral: Negative for agitation and behavioral problems.  All other systems reviewed and are negative.   Vitals:   07/14/17 1104  BP: (!) 155/69  Pulse: 72  Resp: 18  Temp: 98 F (36.7 C)  TempSrc: Oral  SpO2: 97%  Weight: 165 lb 3.2 oz (74.9 kg)  Height: 5\' 5"  (1.651 m)   Body mass index is 27.49 kg/m. Physical Exam  Constitutional: She is oriented to person, place, and time. Vital signs are normal. She appears well-developed and well-nourished. She is active and cooperative. She does not appear ill. No distress.  HENT:  Head: Normocephalic and atraumatic.  Mouth/Throat: Uvula is midline, oropharynx is clear and moist and mucous membranes are normal. Mucous membranes are not pale, not dry and not cyanotic.  Eyes: Pupils are equal, round, and reactive to light. Conjunctivae, EOM and lids are normal.  Neck: Trachea normal, normal range of motion and full passive range of motion without pain. Neck supple. No JVD present. No tracheal deviation, no edema and no erythema present. No thyromegaly present.  Cardiovascular: Normal rate, regular rhythm, normal heart sounds, intact distal pulses and normal pulses. Exam reveals no gallop, no distant heart sounds and no friction rub.  No murmur heard. Pulses:      Dorsalis pedis pulses are 2+ on the right side, and 2+ on the left side.  No edema  Pulmonary/Chest: Effort normal and breath sounds normal. No accessory muscle usage. No respiratory distress. She has no decreased breath sounds. She has no wheezes. She has no rhonchi. She has no rales. She exhibits no  tenderness.  Abdominal: Soft. Normal appearance and bowel sounds are normal. She exhibits no distension and no ascites. There is no tenderness.  Musculoskeletal: She exhibits no edema or tenderness.       Right hip: She exhibits decreased range of motion, decreased strength and laceration.  Expected osteoarthritis, stiffness; Bilateral Calves soft, supple. Negative Homan's Sign. B- pedal pulses equal  Neurological: She is alert and oriented to person, place, and time. She has normal strength.  Skin: Skin is warm and dry. Laceration noted. She is not diaphoretic. No cyanosis. No pallor. Nails show no clubbing.  Psychiatric: She has a normal mood and affect. Her speech is normal and behavior is normal. Judgment and thought content normal. Cognition and memory are normal.  Nursing note and vitals reviewed.   Labs reviewed: Basic Metabolic Panel: Recent Labs    07/07/17 1849 07/08/17 1045 07/09/17 0315 07/10/17 0319  NA 140  --  140 143  K 3.6  --  3.6 3.8  CL 104  --  103 106  CO2 22  --  30 29  GLUCOSE 111*  --  130* 107*  BUN 20  --  13 13  CREATININE 0.83 0.73 0.69 0.68  CALCIUM 9.3  --  8.5* 8.4*   Liver Function Tests: Recent Labs    06/29/17 0900  AST 19  ALT 16  ALKPHOS 127*  BILITOT 0.6  PROT 7.8  ALBUMIN 4.6   No results for input(s): LIPASE,  AMYLASE in the last 8760 hours. No results for input(s): AMMONIA in the last 8760 hours. CBC: Recent Labs    06/29/17 0900 07/07/17 1849 07/08/17 1045 07/09/17 0315 07/10/17 0319  WBC 5.9 13.7* 14.6* 9.9 7.2  NEUTROABS 3.5 11.8*  --   --   --   HGB 14.3 13.3 12.4 10.4* 9.5*  HCT 42.1 40.9 38.6 33.0* 30.1*  MCV 86.8 88.9 90.2 89.9 89.6  PLT 243.0 255 220 207 194   Cardiac Enzymes: No results for input(s): CKTOTAL, CKMB, CKMBINDEX, TROPONINI in the last 8760 hours. BNP: Invalid input(s): POCBNP CBG: No results for input(s): GLUCAP in the last 8760 hours.  Procedures and Imaging Studies During Stay: Dg Chest 1  View  Result Date: 07/07/2017 CLINICAL DATA:  Fall from ladder. Right hip fracture. Right hip pain. EXAM: CHEST  1 VIEW COMPARISON:  Two-view chest x-ray 05/06/2016 FINDINGS: The heart size is normal. Lung volumes are low. Lungs are clear. There is no edema or effusion. The visualized soft tissues and bony thorax are unremarkable. IMPRESSION: 1. Low lung volumes. 2. No acute cardiopulmonary disease. Electronically Signed   By: San Morelle M.D.   On: 07/07/2017 18:56   Ct Abdomen Pelvis W Contrast  Result Date: 07/05/2017 CLINICAL DATA:  Lower abdominal pain and diarrhea. Suspected diverticulitis. EXAM: CT ABDOMEN AND PELVIS WITH CONTRAST TECHNIQUE: Multidetector CT imaging of the abdomen and pelvis was performed using the standard protocol following bolus administration of intravenous contrast. CONTRAST:  153mL ISOVUE-300 IOPAMIDOL (ISOVUE-300) INJECTION 61% COMPARISON:  12/31/2012 FINDINGS: Lower Chest: No acute findings. Hepatobiliary: No hepatic masses identified. Gallbladder is unremarkable. Pancreas:  No mass or inflammatory changes. Spleen: Within normal limits in size and appearance. Adrenals/Urinary Tract: No masses identified. No evidence of hydronephrosis. Unremarkable unopacified urinary bladder. Stomach/Bowel: No evidence of obstruction, abnormal wall thickening or abnormal fluid collections. Normal appendix visualized. Misty density throughout the central small bowel mesentery is stable compared to previous study, consistent with sclerosing mesenteritis. Vascular/Lymphatic: No pathologically enlarged lymph nodes. No abdominal aortic aneurysm. Aortic atherosclerosis. Reproductive: Prior hysterectomy noted. Adnexal regions are unremarkable in appearance. Other:  None. Musculoskeletal:  No suspicious bone lesions identified. IMPRESSION: No acute findings in the abdomen or pelvis. Stable findings of chronic sclerosing mesenteritis. Electronically Signed   By: Earle Gell M.D.   On: 07/05/2017  17:32   Dg C-arm 1-60 Min  Result Date: 07/08/2017 CLINICAL DATA:  77 year old female with a history of open reduction internal fixation right femur EXAM: RIGHT FEMUR 2 VIEWS; DG C-ARM 61-120 MIN COMPARISON:  07/07/2017 FINDINGS: Limited intraoperative fluoroscopic spot images of the right femur during ORIF demonstrating antegrade intramedullary rod with cannulated screw fixation of the right femoral neck. Improved alignment at the fracture site. IMPRESSION: Limited intraoperative fluoroscopic spot images during right femur ORIF, as above. Please refer to the dictated operative report for full details of intraoperative findings and procedure. Electronically Signed   By: Corrie Mckusick D.O.   On: 07/08/2017 09:59   Dg Hip Unilat W Or Wo Pelvis 2-3 Views Right  Result Date: 07/07/2017 CLINICAL DATA:  Fall from ladder 3-4 feet.  Right hip pain. EXAM: DG HIP (WITH OR WITHOUT PELVIS) 2-3V RIGHT COMPARISON:  CT of the abdomen and pelvis 07/05/2017. FINDINGS: A right intratrochanteric fracture is present. There is displacement medially of the lesser trochanter. Femoral head is located. Visualized pelvis is intact.  Proximal left femur is unremarkable. IMPRESSION: Intertrochanteric fracture of the proximal right femur with mild displacement. Electronically Signed   By:  San Morelle M.D.   On: 07/07/2017 18:54   Dg Femur, Min 2 Views Right  Result Date: 07/08/2017 CLINICAL DATA:  77 year old female with a history of open reduction internal fixation right femur EXAM: RIGHT FEMUR 2 VIEWS; DG C-ARM 61-120 MIN COMPARISON:  07/07/2017 FINDINGS: Limited intraoperative fluoroscopic spot images of the right femur during ORIF demonstrating antegrade intramedullary rod with cannulated screw fixation of the right femoral neck. Improved alignment at the fracture site. IMPRESSION: Limited intraoperative fluoroscopic spot images during right femur ORIF, as above. Please refer to the dictated operative report for full  details of intraoperative findings and procedure. Electronically Signed   By: Corrie Mckusick D.O.   On: 07/08/2017 09:59    Assessment/Plan:    Closed fracture of right hip, sequela  Status post hip surgery   Continue participating in PT/OT  Continue exercises as taught by PT/OT  Skin care per protocol  Continue Lovenox 40 mg SQ Q Day x 21 days total  Continue Robaxin 500 mg po Q 6 hours prn spasms, cramps  Continue Norco 7.5/325 mg 1-2 tablets po Q 4 hours prn pain  Follow up with orthopedist as instructed   Patient is being discharged with the following home health services: HHPT/OT through [facility]   Patient is being discharged with the following durable medical equipment:none needed    Patient has been advised to f/u with their PCP in 1-2 weeks to bring them up to date on their rehab stay.  Social services at facility was responsible for arranging this appointment.  Pt was provided with a 30 day supply of prescriptions for medications and refills must be obtained from their PCP.  For controlled substances, a more limited supply may be provided adequate until PCP appointment only.   Future labs/tests needed:    Family/ staff Communication:   Total Time:  Documentation:  Face to Face:  Family/Phone:  Vikki Ports, NP-C [facility] [address], [place] [address] Cell Phone (Mon-Fri 8am-5pm):  [phone] On Call:  [phone] & follow prompts after 5pm & weekends Office Phone:  [phone] Office Fax:  [phone]

## 2017-07-15 DIAGNOSIS — Z9889 Other specified postprocedural states: Secondary | ICD-10-CM | POA: Insufficient documentation

## 2017-07-17 ENCOUNTER — Telehealth: Payer: Self-pay | Admitting: Family Medicine

## 2017-07-17 DIAGNOSIS — M1991 Primary osteoarthritis, unspecified site: Secondary | ICD-10-CM | POA: Diagnosis not present

## 2017-07-17 DIAGNOSIS — G43909 Migraine, unspecified, not intractable, without status migrainosus: Secondary | ICD-10-CM | POA: Diagnosis not present

## 2017-07-17 DIAGNOSIS — F0390 Unspecified dementia without behavioral disturbance: Secondary | ICD-10-CM | POA: Diagnosis not present

## 2017-07-17 DIAGNOSIS — F331 Major depressive disorder, recurrent, moderate: Secondary | ICD-10-CM | POA: Diagnosis not present

## 2017-07-17 DIAGNOSIS — M8580 Other specified disorders of bone density and structure, unspecified site: Secondary | ICD-10-CM | POA: Diagnosis not present

## 2017-07-17 DIAGNOSIS — M80051D Age-related osteoporosis with current pathological fracture, right femur, subsequent encounter for fracture with routine healing: Secondary | ICD-10-CM | POA: Diagnosis not present

## 2017-07-17 NOTE — Telephone Encounter (Signed)
I spoke with Varney Biles RN and she has a call in to the ortho doctor as well; advised Dr Darnell Level would be back on 07/18/17. Varney Biles voiced understanding.

## 2017-07-17 NOTE — Telephone Encounter (Signed)
Copied from [place] [phone]. Topic: Quick Communication - See Telephone Encounter >> Jul 17, 2017  2:01 PM Vernona Rieger wrote: CRM for notification. See Telephone encounter for: 07/17/17.  Varney Biles RN, at [facility] called and wanted to know if Dr Darnell Level would be the one to sign her home health orders or her orthopedic doctor. She can be reached at [phone]

## 2017-07-18 ENCOUNTER — Telehealth (INDEPENDENT_AMBULATORY_CARE_PROVIDER_SITE_OTHER): Payer: Self-pay

## 2017-07-18 DIAGNOSIS — F0390 Unspecified dementia without behavioral disturbance: Secondary | ICD-10-CM | POA: Diagnosis not present

## 2017-07-18 DIAGNOSIS — G43909 Migraine, unspecified, not intractable, without status migrainosus: Secondary | ICD-10-CM | POA: Diagnosis not present

## 2017-07-18 DIAGNOSIS — M8580 Other specified disorders of bone density and structure, unspecified site: Secondary | ICD-10-CM | POA: Diagnosis not present

## 2017-07-18 DIAGNOSIS — F331 Major depressive disorder, recurrent, moderate: Secondary | ICD-10-CM | POA: Diagnosis not present

## 2017-07-18 DIAGNOSIS — M1991 Primary osteoarthritis, unspecified site: Secondary | ICD-10-CM | POA: Diagnosis not present

## 2017-07-18 DIAGNOSIS — M80051D Age-related osteoporosis with current pathological fracture, right femur, subsequent encounter for fracture with routine healing: Secondary | ICD-10-CM | POA: Diagnosis not present

## 2017-07-18 NOTE — Telephone Encounter (Signed)
Ortho should be the one to sign post hip surgery

## 2017-07-18 NOTE — Telephone Encounter (Signed)
Do you have anything on this patient or have you faxed anything back?. See message below.

## 2017-07-18 NOTE — Telephone Encounter (Signed)
Darlina Guys with [facility] would like to know if Dr. Erlinda Hong would be signing [place] orders for patient.  Cb# is [phone].  Please advise.  Thank You.

## 2017-07-18 NOTE — Telephone Encounter (Signed)
Spoke with [facility] relaying Dr. Synthia Innocent message.  Says ok.

## 2017-07-19 ENCOUNTER — Telehealth (INDEPENDENT_AMBULATORY_CARE_PROVIDER_SITE_OTHER): Payer: Self-pay | Admitting: Orthopaedic Surgery

## 2017-07-19 NOTE — Telephone Encounter (Signed)
Kristen from [facility] called to get verbal orders for patient.  PT for 2x a week for 4 weeks.  Please call asap.  Thanks

## 2017-07-19 NOTE — Telephone Encounter (Signed)
IS THIS OK 

## 2017-07-19 NOTE — Telephone Encounter (Signed)
yes

## 2017-07-20 DIAGNOSIS — M80051D Age-related osteoporosis with current pathological fracture, right femur, subsequent encounter for fracture with routine healing: Secondary | ICD-10-CM | POA: Diagnosis not present

## 2017-07-20 DIAGNOSIS — G43909 Migraine, unspecified, not intractable, without status migrainosus: Secondary | ICD-10-CM | POA: Diagnosis not present

## 2017-07-20 DIAGNOSIS — M1991 Primary osteoarthritis, unspecified site: Secondary | ICD-10-CM | POA: Diagnosis not present

## 2017-07-20 DIAGNOSIS — F0390 Unspecified dementia without behavioral disturbance: Secondary | ICD-10-CM | POA: Diagnosis not present

## 2017-07-20 DIAGNOSIS — M8580 Other specified disorders of bone density and structure, unspecified site: Secondary | ICD-10-CM | POA: Diagnosis not present

## 2017-07-20 DIAGNOSIS — F331 Major depressive disorder, recurrent, moderate: Secondary | ICD-10-CM | POA: Diagnosis not present

## 2017-07-20 NOTE — Telephone Encounter (Signed)
Called patient to advise  °

## 2017-07-21 ENCOUNTER — Encounter (INDEPENDENT_AMBULATORY_CARE_PROVIDER_SITE_OTHER): Payer: Self-pay | Admitting: Orthopaedic Surgery

## 2017-07-21 ENCOUNTER — Ambulatory Visit (INDEPENDENT_AMBULATORY_CARE_PROVIDER_SITE_OTHER): Payer: Medicare Other | Admitting: Orthopaedic Surgery

## 2017-07-21 ENCOUNTER — Ambulatory Visit (INDEPENDENT_AMBULATORY_CARE_PROVIDER_SITE_OTHER): Payer: Medicare Other

## 2017-07-21 DIAGNOSIS — S72001A Fracture of unspecified part of neck of right femur, initial encounter for closed fracture: Secondary | ICD-10-CM

## 2017-07-21 NOTE — Progress Notes (Signed)
Post-Op Visit Note   Patient: Kristin Kramer           Date of Birth: 27-Oct-1940           MRN: [id number] Visit Date: 07/21/2017 PCP: [name], MD   Assessment & Plan:  Chief Complaint: No chief complaint on file.  Visit Diagnoses:  1. Closed fracture of right hip, initial encounter [facility])     Plan: Patient is a pleasant 77 year old female who presents to our clinic today 13 days status post right hip IM nail, date of surgery 07/08/2017.  She has been doing well.  She has been ambulating with a walker and working with home health PT making great progress.  Minimal pain.  Examination of her right hip shows a well-healing surgical incision with staples intact.  No signs of infection or cellulitis.  Today, we will remove the staples.  She will continue to work with home health physical therapy.  I will also provide her with a prescription for outpatient physical therapy should she need to continue once home health PT is finished.  She will follow-up with [place] in 4 weeks time for repeat evaluation and 2 views of the right femur.  Call with concerns or questions in the meantime.  Follow-Up Instructions: Return in about 1 month (around 08/18/2017).   Orders:  Orders Placed This Encounter  Procedures  . XR FEMUR, MIN 2 VIEWS RIGHT   No orders of the defined types were placed in this encounter.   Imaging: Xr Femur, Min 2 Views Right  Result Date: 07/21/2017 Stable alignment of the fracture.  No collapse of the femoral head.  Stable fixation.   PMFS History: Patient Active Problem List   Diagnosis Date Noted  . Status post hip surgery 07/15/2017  . Closed right hip fracture ([place]) 07/07/2017  . Dementia 07/07/2017  . Chronic nasal congestion 04/19/2017  . Exertional dyspnea 04/19/2017  . Memory deficit 08/05/2016  . 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 Advanced care planning/counseling discussion 07/31/2014  . Diarrhea 07/31/2014  . Herpes zoster  02/19/2014  . Allergic rhinitis 04/06/2013  . 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  . Post-operative nausea and vomiting   . Refusal of blood transfusions as patient is Jehovah's Witness   . Medicare annual wellness visit, subsequent 01/14/2013  . Transaminitis 01/03/2013  . 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 Osteopenia   . HLD (hyperlipidemia)   . Vitamin D deficiency   . Anxiety 01/05/2012  . Seasonal allergies   . Squamous cell skin cancer   . MDD (major depressive disorder), recurrent episode, moderate ([place])   . Migraines   . Abnormal EKG   . Arthritis    Past Medical History:  Diagnosis Date  . Abdominal pain, other specified site 10.2014   [facility] ER - mesenteric adenitis vs sclerosing mesenteritis vs nonspecific lymphadenitis  . Abnormal ECG    a. left axis deviation;  b. 12/2011 Echo: EF 55-60%, no rwma, pasp 48mmHg.  . Arthritis   . Depression    prior on lexapro, celexa, wellbutrin.  high cymbalta doses cause tremors  . H/O seasonal allergies   . History of pneumonia   . History of UTI    12/2011 - Treatment began preoperatively with CIPRO 500mg  BID  . HLD (hyperlipidemia)    statin intolerance  . Knee osteoarthritis 2013, 2014   bilateral s/p B TKR Noemi Chapel)  . Knee osteoarthritis   .  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 Migraines   . Osteopenia 08/2016   hip -2.4, spine -1.4  . Post-operative nausea and vomiting   . Refusal of blood transfusions as patient is Jehovah's Witness   . Seasonal allergies    cats, dust, mold, roaches  . 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 Isac Sarna), R tibia - then recurrent  . Vitamin D deficiency     Family History  Problem Relation Age of Onset  . Cancer Mother 70       lung, smoker  . Dementia Mother 49  . Cancer Father          lung, smoker  . Cancer Maternal Grandmother 70       leukemia  . Coronary artery disease Paternal Grandmother 95       sudden cardiac death  . Diabetes Neg Hx   . Stroke Neg Hx     Past Surgical History:  Procedure Laterality Date  . BREAST ENHANCEMENT SURGERY    . BUNIONECTOMY  2013   R and L foot  . CATARACT EXTRACTION  2013   R, pending L  . COLONOSCOPY  2007   no records received  . COLONOSCOPY  08/2014   tubular adenoma, rpt 5 yrs Fuller Plan)  . COSMETIC SURGERY  2002   face lift  . dexa  06/2008   T -1.4 spine and hip  . ETHMOIDECTOMY Bilateral 05/19/2016   Procedure: ETHMOIDECTOMY;  Surgeon: Carloyn Manner, MD;  Location: [facility] OR[facility];  Service: ENT;  Laterality: Bilateral;  . EYE SURGERY     bilateral cataract surgery  . FRONTAL SINUS EXPLORATION Bilateral 05/19/2016   Procedure: FRONTAL SINUS EXPLORATION;  Surgeon: Carloyn Manner, MD;  Location: [facility];  Service: ENT;  Laterality: Bilateral;  . INTRAMEDULLARY (IM) NAIL INTERTROCHANTERIC Right 07/08/2017   Procedure: RIGHT INTERTROCHANTRIC IM NAIL;  Surgeon: Leandrew Koyanagi, MD;  Location: [place];  Service: Orthopedics;  Laterality: Right;  . JOINT REPLACEMENT Bilateral 2015   knees  . MAXILLARY ANTROSTOMY Bilateral 05/19/2016   Procedure: MAXILLARY ANTROSTOMY;  Surgeon: Carloyn Manner, MD;  Location: [facility];  Service: ENT;  Laterality: Bilateral;  . MOHS SURGERY  2011   R leg  . PARTIAL HYSTERECTOMY  1980   fibroids, ovaries remained  . SINUS ENDO W/FUSION N/A 05/19/2016   Procedure: ENDOSCOPIC SINUS SURGERY WITH NAVIGATION;  Surgeon: Carloyn Manner, MD;  Location: [facility];  Service: ENT;  Laterality: N/A;  . SPHENOIDECTOMY Bilateral 05/19/2016   Procedure: Coralee Pesa;  Surgeon: Carloyn Manner, MD;  Location: [facility];  Service: ENT;  Laterality: Bilateral;  . SQUAMOUS CELL CARCINOMA EXCISION Right 07/13/2016   Dr. Rolm Bookbinder, [facility]  . TONSILLECTOMY AND ADENOIDECTOMY  1945  . TOTAL KNEE  ARTHROPLASTY  01/02/2012   Right - Surgeon: Lorn Junes, MD  . TOTAL KNEE ARTHROPLASTY Left 03/18/2013   Surgeon: Lorn Junes, MD   Social History   Occupational History  . Occupation: Retired  Tobacco Use  . Smoking status: Never Smoker  . Smokeless tobacco: Never Used  Substance and Sexual Activity  . Alcohol use: Yes    Alcohol/week: 0.0 oz    Comment: occasional // 5-6 drinks per month  . Drug use: No  . Sexual activity: Never

## 2017-07-25 DIAGNOSIS — F331 Major depressive disorder, recurrent, moderate: Secondary | ICD-10-CM | POA: Diagnosis not present

## 2017-07-25 DIAGNOSIS — M1991 Primary osteoarthritis, unspecified site: Secondary | ICD-10-CM | POA: Diagnosis not present

## 2017-07-25 DIAGNOSIS — M80051D Age-related osteoporosis with current pathological fracture, right femur, subsequent encounter for fracture with routine healing: Secondary | ICD-10-CM | POA: Diagnosis not present

## 2017-07-25 DIAGNOSIS — G43909 Migraine, unspecified, not intractable, without status migrainosus: Secondary | ICD-10-CM | POA: Diagnosis not present

## 2017-07-25 DIAGNOSIS — F0390 Unspecified dementia without behavioral disturbance: Secondary | ICD-10-CM | POA: Diagnosis not present

## 2017-07-25 DIAGNOSIS — M8580 Other specified disorders of bone density and structure, unspecified site: Secondary | ICD-10-CM | POA: Diagnosis not present

## 2017-07-26 ENCOUNTER — Ambulatory Visit: Payer: Self-pay | Admitting: Physician Assistant

## 2017-07-27 ENCOUNTER — Other Ambulatory Visit: Payer: Self-pay | Admitting: Gerontology

## 2017-07-27 DIAGNOSIS — G43909 Migraine, unspecified, not intractable, without status migrainosus: Secondary | ICD-10-CM | POA: Diagnosis not present

## 2017-07-27 DIAGNOSIS — F0390 Unspecified dementia without behavioral disturbance: Secondary | ICD-10-CM | POA: Diagnosis not present

## 2017-07-27 DIAGNOSIS — M80051D Age-related osteoporosis with current pathological fracture, right femur, subsequent encounter for fracture with routine healing: Secondary | ICD-10-CM | POA: Diagnosis not present

## 2017-07-27 DIAGNOSIS — M8580 Other specified disorders of bone density and structure, unspecified site: Secondary | ICD-10-CM | POA: Diagnosis not present

## 2017-07-27 DIAGNOSIS — M1991 Primary osteoarthritis, unspecified site: Secondary | ICD-10-CM | POA: Diagnosis not present

## 2017-07-27 DIAGNOSIS — F331 Major depressive disorder, recurrent, moderate: Secondary | ICD-10-CM | POA: Diagnosis not present

## 2017-07-28 ENCOUNTER — Other Ambulatory Visit: Payer: Self-pay | Admitting: Family Medicine

## 2017-07-28 NOTE — Telephone Encounter (Signed)
Patient needs to follow up with her PCP for this medication.

## 2017-07-28 NOTE — Telephone Encounter (Signed)
Refill request Hydrocodone 7.5 mg  LOV 06/29/2017 Dr [name]  Last Filled on 5-8 -2019 by Toni Arthurs NP when patient was in [facility]   120 tabs 1-2 q 6 hours prn moderate pain  Pharmacy requested [facility] [address]  [place]   Attempted to call pt  VM full

## 2017-07-28 NOTE — Telephone Encounter (Signed)
Copied from [place] [id number]. Topic: Quick Communication - Rx Refill/Question >> Jul 28, 2017  3:52 PM Robina Ade, Helene Kelp D wrote: Medication: HYDROcodone-acetaminophen ([place]) 7.5-325 MG tablet  Has the patient contacted their pharmacy?Yes (Agent: If no, request that the patient contact the pharmacy for the refill.) (Agent: If yes, when and what did the pharmacy advise?)  Preferred Pharmacy (with phone number or street name): [id number] [address], [place]: Please be advised that RX refills may take up to 3 business days. We ask that you follow-up with your pharmacy.

## 2017-07-29 NOTE — Telephone Encounter (Signed)
This was for post op pain control - #120 was high amt. pt should not continue needing high amt of narcotics. Also, this request should go through ortho. Will forward to Dr Erlinda Hong.

## 2017-08-02 DIAGNOSIS — M8580 Other specified disorders of bone density and structure, unspecified site: Secondary | ICD-10-CM | POA: Diagnosis not present

## 2017-08-02 DIAGNOSIS — F331 Major depressive disorder, recurrent, moderate: Secondary | ICD-10-CM | POA: Diagnosis not present

## 2017-08-02 DIAGNOSIS — M1991 Primary osteoarthritis, unspecified site: Secondary | ICD-10-CM | POA: Diagnosis not present

## 2017-08-02 DIAGNOSIS — F0390 Unspecified dementia without behavioral disturbance: Secondary | ICD-10-CM | POA: Diagnosis not present

## 2017-08-02 DIAGNOSIS — M80051D Age-related osteoporosis with current pathological fracture, right femur, subsequent encounter for fracture with routine healing: Secondary | ICD-10-CM | POA: Diagnosis not present

## 2017-08-02 DIAGNOSIS — G43909 Migraine, unspecified, not intractable, without status migrainosus: Secondary | ICD-10-CM | POA: Diagnosis not present

## 2017-08-04 DIAGNOSIS — M1991 Primary osteoarthritis, unspecified site: Secondary | ICD-10-CM | POA: Diagnosis not present

## 2017-08-04 DIAGNOSIS — F331 Major depressive disorder, recurrent, moderate: Secondary | ICD-10-CM | POA: Diagnosis not present

## 2017-08-04 DIAGNOSIS — F0390 Unspecified dementia without behavioral disturbance: Secondary | ICD-10-CM | POA: Diagnosis not present

## 2017-08-04 DIAGNOSIS — M8580 Other specified disorders of bone density and structure, unspecified site: Secondary | ICD-10-CM | POA: Diagnosis not present

## 2017-08-04 DIAGNOSIS — G43909 Migraine, unspecified, not intractable, without status migrainosus: Secondary | ICD-10-CM | POA: Diagnosis not present

## 2017-08-04 DIAGNOSIS — M80051D Age-related osteoporosis with current pathological fracture, right femur, subsequent encounter for fracture with routine healing: Secondary | ICD-10-CM | POA: Diagnosis not present

## 2017-08-06 ENCOUNTER — Other Ambulatory Visit: Payer: Self-pay | Admitting: Family Medicine

## 2017-08-06 DIAGNOSIS — E559 Vitamin D deficiency, unspecified: Secondary | ICD-10-CM

## 2017-08-06 DIAGNOSIS — R7401 Elevation of levels of liver transaminase levels: Secondary | ICD-10-CM

## 2017-08-06 DIAGNOSIS — R740 Nonspecific elevation of levels of transaminase and lactic acid dehydrogenase [LDH]: Secondary | ICD-10-CM

## 2017-08-06 DIAGNOSIS — D649 Anemia, unspecified: Secondary | ICD-10-CM

## 2017-08-06 DIAGNOSIS — E785 Hyperlipidemia, unspecified: Secondary | ICD-10-CM

## 2017-08-07 DIAGNOSIS — M1991 Primary osteoarthritis, unspecified site: Secondary | ICD-10-CM | POA: Diagnosis not present

## 2017-08-07 DIAGNOSIS — M8580 Other specified disorders of bone density and structure, unspecified site: Secondary | ICD-10-CM | POA: Diagnosis not present

## 2017-08-07 DIAGNOSIS — F0390 Unspecified dementia without behavioral disturbance: Secondary | ICD-10-CM | POA: Diagnosis not present

## 2017-08-07 DIAGNOSIS — M80051D Age-related osteoporosis with current pathological fracture, right femur, subsequent encounter for fracture with routine healing: Secondary | ICD-10-CM | POA: Diagnosis not present

## 2017-08-07 DIAGNOSIS — F331 Major depressive disorder, recurrent, moderate: Secondary | ICD-10-CM | POA: Diagnosis not present

## 2017-08-07 DIAGNOSIS — G43909 Migraine, unspecified, not intractable, without status migrainosus: Secondary | ICD-10-CM | POA: Diagnosis not present

## 2017-08-08 ENCOUNTER — Other Ambulatory Visit (INDEPENDENT_AMBULATORY_CARE_PROVIDER_SITE_OTHER): Payer: Medicare Other

## 2017-08-08 DIAGNOSIS — E785 Hyperlipidemia, unspecified: Secondary | ICD-10-CM | POA: Diagnosis not present

## 2017-08-08 DIAGNOSIS — D649 Anemia, unspecified: Secondary | ICD-10-CM | POA: Diagnosis not present

## 2017-08-08 DIAGNOSIS — E559 Vitamin D deficiency, unspecified: Secondary | ICD-10-CM

## 2017-08-08 LAB — LIPID PANEL
CHOL/HDL RATIO: 4
Cholesterol: 244 mg/dL — ABNORMAL HIGH (ref 0–200)
HDL: 56.3 mg/dL (ref >39.00)
LDL CALC: 167 mg/dL — AB (ref 0–99)
NONHDL: 187.67
Triglycerides: 105 mg/dL (ref 0.0–149.0)
VLDL: 21 mg/dL (ref 0.0–40.0)

## 2017-08-08 LAB — COMPREHENSIVE METABOLIC PANEL
ALK PHOS: 141 U/L — AB (ref 39–117)
ALT: 11 U/L (ref 0–35)
AST: 16 U/L (ref 0–37)
Albumin: 4.3 g/dL (ref 3.5–5.2)
BUN: 25 mg/dL — AB (ref 6–23)
CHLORIDE: 106 meq/L (ref 96–112)
CO2: 29 meq/L (ref 19–32)
Calcium: 9.9 mg/dL (ref 8.4–10.5)
Creatinine, Ser: 0.75 mg/dL (ref 0.40–1.20)
GFR: 79.75 mL/min (ref >60.00)
GLUCOSE: 107 mg/dL — AB (ref 70–99)
POTASSIUM: 4 meq/L (ref 3.5–5.1)
SODIUM: 144 meq/L (ref 135–145)
TOTAL PROTEIN: 7.3 g/dL (ref 6.0–8.3)
Total Bilirubin: 0.3 mg/dL (ref 0.2–1.2)

## 2017-08-08 LAB — CBC WITH DIFFERENTIAL/PLATELET
Basophils Absolute: 0.1 10*3/uL (ref 0.0–0.1)
Basophils Relative: 1.2 % (ref 0.0–3.0)
EOS PCT: 6 % — AB (ref 0.0–5.0)
Eosinophils Absolute: 0.3 10*3/uL (ref 0.0–0.7)
HCT: 38.3 % (ref 36.0–46.0)
Hemoglobin: 12.9 g/dL (ref 12.0–15.0)
LYMPHS ABS: 1.5 10*3/uL (ref 0.7–4.0)
Lymphocytes Relative: 35.7 % (ref 12.0–46.0)
MCHC: 33.7 g/dL (ref 30.0–36.0)
MCV: 86.5 fl (ref 78.0–100.0)
MONO ABS: 0.4 10*3/uL (ref 0.1–1.0)
Monocytes Relative: 9.4 % (ref 3.0–12.0)
NEUTROS PCT: 47.7 % (ref 43.0–77.0)
Neutro Abs: 2 10*3/uL (ref 1.4–7.7)
Platelets: 237 10*3/uL (ref 150.0–400.0)
RBC: 4.43 Mil/uL (ref 3.87–5.11)
RDW: 13.4 % (ref 11.5–15.5)
WBC: 4.3 10*3/uL (ref 4.0–10.5)

## 2017-08-08 LAB — VITAMIN D 25 HYDROXY (VIT D DEFICIENCY, FRACTURES): VITD: 43.28 ng/mL (ref 30.00–100.00)

## 2017-08-09 ENCOUNTER — Ambulatory Visit (INDEPENDENT_AMBULATORY_CARE_PROVIDER_SITE_OTHER): Payer: Medicare Other | Admitting: Family Medicine

## 2017-08-09 ENCOUNTER — Ambulatory Visit (INDEPENDENT_AMBULATORY_CARE_PROVIDER_SITE_OTHER): Payer: Medicare Other

## 2017-08-09 ENCOUNTER — Ambulatory Visit: Payer: Self-pay

## 2017-08-09 ENCOUNTER — Encounter: Payer: Self-pay | Admitting: Family Medicine

## 2017-08-09 VITALS — BP 130/82 | HR 73 | Temp 98.5°F | Ht 64.5 in | Wt 146.5 lb

## 2017-08-09 DIAGNOSIS — Z7189 Other specified counseling: Secondary | ICD-10-CM

## 2017-08-09 DIAGNOSIS — K654 Sclerosing mesenteritis: Secondary | ICD-10-CM

## 2017-08-09 DIAGNOSIS — M8580 Other specified disorders of bone density and structure, unspecified site: Secondary | ICD-10-CM

## 2017-08-09 DIAGNOSIS — E559 Vitamin D deficiency, unspecified: Secondary | ICD-10-CM | POA: Diagnosis not present

## 2017-08-09 DIAGNOSIS — S72001S Fracture of unspecified part of neck of right femur, sequela: Secondary | ICD-10-CM | POA: Diagnosis not present

## 2017-08-09 DIAGNOSIS — F331 Major depressive disorder, recurrent, moderate: Secondary | ICD-10-CM | POA: Diagnosis not present

## 2017-08-09 DIAGNOSIS — D62 Acute posthemorrhagic anemia: Secondary | ICD-10-CM | POA: Diagnosis not present

## 2017-08-09 DIAGNOSIS — Z0000 Encounter for general adult medical examination without abnormal findings: Secondary | ICD-10-CM

## 2017-08-09 DIAGNOSIS — E785 Hyperlipidemia, unspecified: Secondary | ICD-10-CM

## 2017-08-09 DIAGNOSIS — R413 Other amnesia: Secondary | ICD-10-CM | POA: Diagnosis not present

## 2017-08-09 DIAGNOSIS — M170 Bilateral primary osteoarthritis of knee: Secondary | ICD-10-CM | POA: Diagnosis not present

## 2017-08-09 MED ORDER — HYDROCODONE-ACETAMINOPHEN 7.5-325 MG PO TABS: 1.0000 | ORAL_TABLET | Freq: Four times a day (QID) | ORAL | 0 refills | Status: DC | PRN

## 2017-08-09 MED ORDER — METHOCARBAMOL 500 MG PO TABS: 500.0000 mg | ORAL_TABLET | Freq: Two times a day (BID) | ORAL | 0 refills | Status: DC | PRN

## 2017-08-09 NOTE — Progress Notes (Signed)
Subjective:   Kristin Kramer is a 77 y.o. female who presents for Medicare Annual (Subsequent) preventive examination.  Review of Systems:  N/A Cardiac Risk Factors include: advanced age (>23men, >37 women);dyslipidemia     Objective:     Vitals: BP 130/82 (BP Location: Right Arm, Patient Position: Sitting, Cuff Size: Normal)   Pulse 73   Temp 98.5 F (36.9 C) (Oral)   Ht 5' 4.5" (1.638 m) Comment: no shoes  Wt 146 lb 8 oz (66.5 kg)   SpO2 98%   BMI 24.76 kg/m   Body mass index is 24.76 kg/m.  Advanced Directives 08/09/2017 07/14/2017 08/05/2016 07/15/2016 05/19/2016 05/12/2016 03/11/2013  Does Patient Have a Medical Advance Directive? Yes No Yes No No;Yes Yes Patient has advance directive, copy not in chart  Type of Advance Directive [place];Living will - [place];Living will - [place];Living will Living will;Healthcare Power of [place];Living will  Does patient want to make changes to medical advance directive? - - - - No - Patient declined No - Patient declined -  Copy of [place] in Chart? Yes - Yes - No - copy requested No - copy requested Copy requested from family    Tobacco Social History   Tobacco Use  Smoking Status Never Smoker  Smokeless Tobacco Never Used     Counseling given: No   Clinical Intake:  Pre-visit preparation completed: Yes  Pain : 0-10 Pain Score: 1  Pain Type: Chronic pain Pain Location: Hip Pain Orientation: Right Pain Onset: More than a month ago Pain Frequency: Constant     Nutritional Status: BMI 25 -29 Overweight Nutritional Risks: None Diabetes: No  How often do you need to have someone help you when you read instructions, pamphlets, or other written materials from your doctor or pharmacy?: 1 - Never What is the last grade level you completed in school?: 12th grade + 1 yr college  Interpreter Needed?: No  Comments: pt is  a widow and lives alone Information entered by :: LPinson, LPN  Past Medical History:  Diagnosis Date  . Abdominal pain, other specified site 10.2014   [facility] ER - mesenteric adenitis vs sclerosing mesenteritis vs nonspecific lymphadenitis  . Abnormal ECG    a. left axis deviation;  b. 12/2011 Echo: EF 55-60%, no rwma, pasp 67mmHg.  . Arthritis   . Depression    prior on lexapro, celexa, wellbutrin.  high cymbalta doses cause tremors  . H/O seasonal allergies   . History of pneumonia   . History of UTI    12/2011 - Treatment began preoperatively with CIPRO 500mg  BID  . HLD (hyperlipidemia)    statin intolerance  . Knee osteoarthritis 2013, 2014   bilateral s/p B TKR Noemi [facility])  . Knee osteoarthritis   . 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 Migraines   . Osteopenia 08/2016   hip -2.4, spine -1.4  . Post-operative nausea and vomiting   . Refusal of blood transfusions as patient is Jehovah's Witness   . Seasonal allergies    cats, dust, mold, roaches  . 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 Isac Sarna), R tibia - then recurrent  . Vitamin D deficiency    Past Surgical History:  Procedure Laterality Date  . BREAST ENHANCEMENT SURGERY    .  BUNIONECTOMY  2013   R and L foot  . CATARACT EXTRACTION  2013   R, pending L  . COLONOSCOPY  2007   no records received  . COLONOSCOPY  08/2014   tubular adenoma, rpt 5 yrs Fuller Plan)  . COSMETIC SURGERY  2002   face lift  . dexa  06/2008   T -1.4 spine and hip  . ETHMOIDECTOMY Bilateral 05/19/2016   Procedure: ETHMOIDECTOMY;  Surgeon: Carloyn Manner, MD;  Location: [facility];  Service: ENT;  Laterality: Bilateral;  . EYE SURGERY     bilateral cataract surgery  . FRONTAL SINUS EXPLORATION Bilateral 05/19/2016   Procedure: FRONTAL SINUS EXPLORATION;  Surgeon: Carloyn Manner, MD;  Location: [facility];   Service: ENT;  Laterality: Bilateral;  . INTRAMEDULLARY (IM) NAIL INTERTROCHANTERIC Right 07/08/2017   Procedure: RIGHT INTERTROCHANTRIC IM NAIL;  Surgeon: Leandrew Koyanagi, MD;  Location: [place];  Service: Orthopedics;  Laterality: Right;  . JOINT REPLACEMENT Bilateral 2015   knees  . MAXILLARY ANTROSTOMY Bilateral 05/19/2016   Procedure: MAXILLARY ANTROSTOMY;  Surgeon: Carloyn Manner, MD;  Location: [facility];  Service: ENT;  Laterality: Bilateral;  . MOHS SURGERY  2011   R leg  . PARTIAL HYSTERECTOMY  1980   fibroids, ovaries remained  . SINUS ENDO W/FUSION N/A 05/19/2016   Procedure: ENDOSCOPIC SINUS SURGERY WITH NAVIGATION;  Surgeon: Carloyn Manner, MD;  Location: [facility];  Service: ENT;  Laterality: N/A;  . SPHENOIDECTOMY Bilateral 05/19/2016   Procedure: Coralee Pesa;  Surgeon: Carloyn Manner, MD;  Location: [facility];  Service: ENT;  Laterality: Bilateral;  . SQUAMOUS CELL CARCINOMA EXCISION Right 07/13/2016   Dr. Rolm Bookbinder, [facility]  . TONSILLECTOMY AND ADENOIDECTOMY  1945  . TOTAL KNEE ARTHROPLASTY  01/02/2012   Right - Surgeon: Lorn Junes, MD  . TOTAL KNEE ARTHROPLASTY Left 03/18/2013   Surgeon: Lorn Junes, MD   Family History  Problem Relation Age of Onset  . Cancer Mother 29       lung, smoker  . Dementia Mother 31  . Cancer Father        lung, smoker  . Cancer Maternal Grandmother 79       leukemia  . Coronary artery disease Paternal Grandmother 25       sudden cardiac death  . Diabetes Neg Hx   . Stroke Neg Hx    Social History   Socioeconomic History  . Marital status: Widowed    Spouse name: Not on file  . Number of children: 3  . Years of education: Not on file  . Highest education level: Not on file  Occupational History  . Occupation: Retired  Scientific laboratory technician  . Financial resource strain: Not on file  . Food insecurity:    Worry: Not on file    Inability: Not on file  . Transportation needs:    Medical: Not on file     Non-medical: Not on file  Tobacco Use  . Smoking status: Never Smoker  . Smokeless tobacco: Never Used  Substance and Sexual Activity  . Alcohol use: Yes    Alcohol/week: 0.0 oz    Comment: occasional // 5-6 drinks per month  . Drug use: No  . Sexual activity: Not Currently  Lifestyle  . Physical activity:    Days per week: Not on file    Minutes per session: Not on file  . Stress: Not on file  Relationships  . Social connections:    Talks on phone: Not on file  Gets together: Not on file    Attends religious service: Not on file    Active member of club or organization: Not on file    Attends meetings of clubs or organizations: Not on file    Relationship status: Not on file  Other Topics Concern  . Not on file  Social History Narrative   Lives alone.  Brother lives nearby.   Occupation: retired, was Network engineer and housewife   Edu: 1 yr college   Activity: limited by knee   Diet: good water, fruits/vegetables daily   Religion: Jehova's witness    Outpatient Encounter Medications as of 08/09/2017  Medication Sig  . Cholecalciferol (VITAMIN D-3) 5000 units TABS Take 1 tablet by mouth daily.  Marland Kitchen docusate sodium (COLACE) 100 MG capsule Take 1 capsule (100 mg total) by mouth 2 (two) times daily.  Marland Kitchen donepezil (ARICEPT) 5 MG tablet Take 1 tablet (5 mg total) by mouth daily.  Marland Kitchen loratadine (CLARITIN) 10 MG tablet Take 1 tablet (10 mg total) by mouth daily.  . metroNIDAZOLE (METROCREAM) 0.75 % cream Apply 1 application topically 2 (two) times daily. Applied daily to patient face  . montelukast (SINGULAIR) 10 MG tablet TAKE 1 TABLET BY MOUTH EVERYDAY AT BEDTIME  . niacinamide 500 MG tablet Take 500 mg by mouth daily. Per dermatology  . ondansetron (ZOFRAN) 4 MG tablet Take 1 tablet (4 mg total) by mouth every 6 (six) hours as needed for nausea.  . pantoprazole (PROTONIX) 20 MG tablet Take 20 mg by mouth every other day. While on Celebrex and lovenox, does not need at dc  . Probiotic  Product (PROBIOTIC PO) Take 1 capsule by mouth daily.   . ranitidine (ZANTAC) 150 MG tablet Take 150 mg by mouth 2 (two) times daily as needed. For acid reflux/indigestion/GERD  . sertraline (ZOLOFT) 25 MG tablet Take 25 mg by mouth every other day. alternate with 1.5 tabs (37.5 mg)  every other day  . sertraline (ZOLOFT) 25 MG tablet Take 37.5 mg by mouth every other day. alternate with 1 tab (25 mg) daily  . vitamin B-12 (CYANOCOBALAMIN) 1000 MCG tablet Take 1,000 mcg by mouth daily.  . [DISCONTINUED] celecoxib (CELEBREX) 200 MG capsule Take 1 capsule (200 mg total) by mouth 2 (two) times daily.  . [DISCONTINUED] enoxaparin (LOVENOX) 40 MG/0.4ML injection Inject 0.4 mLs (40 mg total) into the skin daily.  . [DISCONTINUED] HYDROcodone-acetaminophen (NORCO) 7.5-325 MG tablet Take 1-2 tablets by mouth every 6 (six) hours as needed for moderate pain. (Patient not taking: Reported on 08/09/2017)  . [DISCONTINUED] methocarbamol (ROBAXIN) 500 MG tablet Take 1 tablet (500 mg total) by mouth every 8 (eight) hours as needed for muscle spasms. (Patient not taking: Reported on 08/09/2017)   No facility-administered encounter medications on file as of 08/09/2017.     Activities of Daily Living In your present state of health, do you have any difficulty performing the following activities: 08/09/2017  Hearing? N  Vision? N  Difficulty concentrating or making decisions? Y  Walking or climbing stairs? Y  Dressing or bathing? N  Doing errands, shopping? N  Preparing Food and eating ? N  Using the Toilet? N  In the past six months, have you accidently leaked urine? Y  Do you have problems with loss of bowel control? N  Managing your Medications? N  Managing your Finances? N  Housekeeping or managing your Housekeeping? N  Some recent data might be hidden    Patient Care Team: [name], MD as  PCP - General (Family Medicine) [name], [place] as Consulting Physician (Optometry)    Assessment:    This is a routine wellness examination for [place].   Hearing Screening   125Hz  250Hz  500Hz  1000Hz  2000Hz  3000Hz  4000Hz  6000Hz  8000Hz   Right ear:   40 40 40  40    Left ear:   40 40 40  40    Vision Screening Comments: 08/2016 with Dr. Maryruth Hancock B.    Exercise Activities and Dietary recommendations Current Exercise Habits: The patient does not participate in regular exercise at present(PT exercises 60 min 2x/weekly), Exercise limited by: None identified  Goals    . Patient Stated     Starting 08/09/2017, I will continue to take medications as prescribed.        Fall Risk Fall Risk  08/09/2017 09/23/2016 08/05/2016 08/04/2015 07/31/2014  Falls in the past year? Yes Yes Yes No Yes  Comment - Emmi Telephone Survey: data to providers prior to load pt reports 2 falls; both with injury and 1 with medical treatment - -  Number falls in past yr: 1 2 or more 2 or more - 1  Comment - Emmi Telephone Survey Actual Response = 20 - - -  Injury with Fall? Yes Yes Yes - -   Depression Screen PHQ 2/9 Scores 08/09/2017 08/05/2016 08/04/2015 07/31/2014  PHQ - 2 Score 0 3 2 0  PHQ- 9 Score 0 12 13 -     Cognitive Function MMSE - Mini Mental State Exam 08/09/2017 08/05/2016  Orientation to time 5 5  Orientation to Place 5 5  Registration 3 3  Attention/ Calculation 0 0  Recall 3 3  Language- name 2 objects 0 0  Language- repeat 1 1  Language- follow 3 step command 3 2  Language- follow 3 step command-comments - pt was unable to follow 1 step of 3 step command  Language- read & follow direction 0 0  Write a sentence 0 0  Copy design 0 0  Total score 20 19     PLEASE NOTE: A Mini-Cog screen was completed. Maximum score is 20. A value of 0 denotes this part of [place] MMSE was not completed or the patient failed this part of the Mini-Cog screening.   Mini-Cog Screening Orientation to Time - Max 5 pts Orientation to Place - Max 5 pts Registration - Max 3 pts Recall - Max 3 pts Language Repeat - Max 1  pts Language Follow 3 Step Command - Max 3 pts     Immunization History  Administered Date(s) Administered  . Influenza Split 12/01/2011  . Influenza, High Dose Seasonal PF 02/10/2014  . Influenza,inj,Quad PF,6+ Mos 01/14/2013, 12/29/2014, 02/04/2016  . Influenza-Unspecified 01/03/2017  . Pneumococcal Conjugate-13 07/31/2014  . Pneumococcal Polysaccharide-23 12/01/2011  . Tdap 10/22/2014  . Zoster 02/14/2013  . Zoster Recombinat (Shingrix) 11/29/2016    Screening Tests Health Maintenance  Topic Date Due  . INFLUENZA VACCINE  10/05/2017  . MAMMOGRAM  02/15/2018  . COLONOSCOPY  08/28/2019  . DTaP/Tdap/Td (2 - Td) 10/21/2024  . TETANUS/TDAP  10/21/2024  . DEXA SCAN  Completed  . PNA vac Low Risk Adult  Completed   Plan:     I have personally reviewed, addressed, and noted the following in the patient's chart:  A. Medical and social history B. Use of alcohol, tobacco or illicit drugs  C. Current medications and supplements D. Functional ability and status E.  Nutritional status F.  Physical activity G. Advance directives H. List  of other physicians I.  Hospitalizations, surgeries, and ER visits in previous 12 months J.  [place] to include hearing, vision, cognitive, depression L. Referrals and appointments - none  In addition, I have reviewed and discussed with patient certain preventive protocols, quality metrics, and best practice recommendations. A written personalized care plan for preventive services as well as general preventive health recommendations were provided to patient.  See attached scanned questionnaire for additional information.   Signed,   Lindell Noe, MHA, BS, LPN Health Coach

## 2017-08-09 NOTE — Progress Notes (Signed)
PCP notes:   Health maintenance:  No gaps identified.  Abnormal screenings:   Fall risk - hx of single fall with injury Fall Risk  08/09/2017 09/23/2016 08/05/2016 08/04/2015 07/31/2014  Falls in the past year? Yes Yes Yes No Yes  Comment - Emmi Telephone Survey: data to providers prior to load pt reports 2 falls; both with injury and 1 with medical treatment - -  Number falls in past yr: 1 2 or more 2 or more - 1  Comment - Emmi Telephone Survey Actual Response = 20 - - -  Injury with Fall? Yes Yes Yes - -    Patient concerns:   Patient requested refill of pain medication and muscle relaxant. PCP notified.    Nurse concerns:  None  Next PCP appt:   08/09/17 @ 1030

## 2017-08-09 NOTE — Patient Instructions (Signed)
Kristin Kramer , Thank you for taking time to come for your Medicare Wellness Visit. I appreciate your ongoing commitment to your health goals. Please review the following plan we discussed and let me know if I can assist you in the future.   These are the goals we discussed: Goals    . Patient Stated     Starting 08/09/2017, I will continue to take medications as prescribed.        This is a list of the screening recommended for you and due dates:  Health Maintenance  Topic Date Due  . Flu Shot  10/05/2017  . Mammogram  02/15/2018  . Colon Cancer Screening  08/28/2019  . DTaP/Tdap/Td vaccine (2 - Td) 10/21/2024  . Tetanus Vaccine  10/21/2024  . DEXA scan (bone density measurement)  Completed  . Pneumonia vaccines  Completed   Preventive Care for Adults  A healthy lifestyle and preventive care can promote health and wellness. Preventive health guidelines for adults include the following key practices.  . A routine yearly physical is a good way to check with your health care provider about your health and preventive screening. It is a chance to share any concerns and updates on your health and to receive a thorough exam.  . Visit your dentist for a routine exam and preventive care every 6 months. Brush your teeth twice a day and floss once a day. Good oral hygiene prevents tooth decay and gum disease.  . The frequency of eye exams is based on your age, health, family medical history, use  of contact lenses, and other factors. Follow your health care provider's recommendations for frequency of eye exams.  . Eat a healthy diet. Foods like vegetables, fruits, whole grains, low-fat dairy products, and lean protein foods contain the nutrients you need without too many calories. Decrease your intake of foods high in solid fats, added sugars, and salt. Eat the right amount of calories for you. Get information about a proper diet from your health care provider, if necessary.  . Regular physical  exercise is one of the most important things you can do for your health. Most adults should get at least 150 minutes of moderate-intensity exercise (any activity that increases your heart rate and causes you to sweat) each week. In addition, most adults need muscle-strengthening exercises on 2 or more days a week.  Silver Sneakers may be a benefit available to you. To determine eligibility, you may visit the website: www.silversneakers.com or contact program at [phone]) [phone] Mon-Fri between 8AM-8PM.   . Maintain a healthy weight. The body mass index (BMI) is a screening tool to identify possible weight problems. It provides an estimate of body fat based on height and weight. Your health care provider can find your BMI and can help you achieve or maintain a healthy weight.   For adults 20 years and older: ? A BMI below 18.5 is considered underweight. ? A BMI of 18.5 to 24.9 is normal. ? A BMI of 25 to 29.9 is considered overweight. ? A BMI of 30 and above is considered obese.   . Maintain normal blood lipids and cholesterol levels by exercising and minimizing your intake of saturated fat. Eat a balanced diet with plenty of fruit and vegetables. Blood tests for lipids and cholesterol should begin at age 64 and be repeated every 5 years. If your lipid or cholesterol levels are high, you are over 50, or you are at high risk for heart disease, you may need your  cholesterol levels checked more frequently. Ongoing high lipid and cholesterol levels should be treated with medicines if diet and exercise are not working.  . If you smoke, find out from your health care provider how to quit. If you do not use tobacco, please do not start.  . If you choose to drink alcohol, please do not consume more than 2 drinks per day. One drink is considered to be 12 ounces (355 mL) of beer, 5 ounces (148 mL) of wine, or 1.5 ounces (44 mL) of liquor.  . If you are 3-24 years old, ask your health care provider if you  should take aspirin to prevent strokes.  . Use sunscreen. Apply sunscreen liberally and repeatedly throughout the day. You should seek shade when your shadow is shorter than you. Protect yourself by wearing long sleeves, pants, a wide-brimmed hat, and sunglasses year round, whenever you are outdoors.  . Once a month, do a whole body skin exam, using a mirror to look at the skin on your back. Tell your health care provider of new moles, moles that have irregular borders, moles that are larger than a pencil eraser, or moles that have changed in shape or color.

## 2017-08-09 NOTE — Assessment & Plan Note (Addendum)
Chronic, on fish oil and niacin, and has made diet and exercise changes. LDL remains elevated - will consider statin.  The 10-year ASCVD risk score Mikey Bussing DC Brooke Bonito., et al., 2013) is: 18.5%   Values used to calculate the score:     Age: 77 years     Sex: Female     Is Non-Hispanic African American: No     Diabetic: No     Tobacco smoker: No     Systolic Blood Pressure: 563 mmHg     Is BP treated: No     HDL Cholesterol: 56.3 mg/dL     Total Cholesterol: 244 mg/dL

## 2017-08-09 NOTE — Progress Notes (Signed)
BP 130/82 (BP Location: Right Arm, Patient Position: Sitting, Cuff Size: Normal)   Pulse 73   Temp 98.5 F (36.9 C) (Oral)   Ht 5' 4.5" (1.638 m) Comment: no shoes  Wt 146 lb 8 oz (66.5 kg)   SpO2 98%   BMI 24.76 kg/m    CC: AMW f/u visit Subjective:    Patient ID: Kristin Kramer, female    DOB: Feb 03, 1941, 77 y.o.   MRN: [id number]  HPI: Kristin Kramer is a 77 y.o. female presenting on 08/09/2017 for Annual Exam (Pt 2)   Recent hospitalization for R hip fracture after fall s/p intertrochanteric IM nail placement. Discharged to SNF, now back at home.  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 RLQ abd discomfort persists. Bowels are back to normal. Had to cancel appt with GI - has f/u with Dr Fuller Plan scheduled next month.   Saw Katha Cabal today for medicare wellness visit. Note will be reviewed.   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 Also on fish oil daily and has made diet and exercise changes.   Has been taking celebrex 200mg  bid since rehab. Tylenol not effective. Requests hydrocodone refill.  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 Well woman exam - 2012 by prior PCP. Hysterectomy for fibroids, ovaries remained. Menopause early 80s. Recent CT with normal adnexal region.  Mammo 02/2017 DEXA Date: 06/2008 T -1.4 spine and hip. DEXA 08/2016 - -2.4 hip, -1.4 spine, rpt 2 yrs. Flu shot yearly  Pneumovax 2013, prevnar 2016 Tdap 10/2014 zostavax - 02/2013 Shingrix - 11/2016. Due for 2nd - [facility] out.  Advanced directives - reviewed and scanned 08/2017. Lists Marvis Terance Hart and [employer] as HCPOA. Jehova's witness - no blood transfusions but would want minor fractions of blood discussed with her. Does not want life prolonged if hopeless situation.  Seat belt use discussed Sunscreen use discussed. No changing moles  on skin. Sees Dr Ubaldo Glassing.  Non smoker  Alcohol - occasional Dentist - Q6 months Eye exam - yearly  Lives alone. Brother lives nearby.  Occupation: retired, was Network engineer and housewife  Edu: 1 yr college  Activity: limited by knee osteoarthritis, [place] [employer] [place].  Diet: good water, fruits/vegetables daily   Relevant past medical, surgical, family and social history reviewed and updated as indicated. Interim medical history since our last visit reviewed. Allergies and medications reviewed and updated. Outpatient Medications Prior to Visit  Medication Sig Dispense Refill  . celecoxib (CELEBREX) 200 MG capsule Take 1 capsule (200 mg total) by mouth daily.    . Cholecalciferol (VITAMIN D-3) 5000 units TABS Take 1 tablet by mouth daily.    Marland Kitchen docusate sodium (COLACE) 100 MG capsule Take 1 capsule (100 mg total) by mouth 2 (two) times daily. 10 capsule 0  . donepezil (ARICEPT) 5 MG tablet Take 1 tablet (5 mg total) by mouth daily. 30 tablet 6  . loratadine (CLARITIN) 10 MG tablet Take 1 tablet (10 mg total) by mouth daily.    . metroNIDAZOLE (METROCREAM) 0.75 % cream Apply 1 application topically 2 (two) times daily. Applied daily to patient face    . montelukast (SINGULAIR) 10 MG tablet TAKE 1 TABLET BY MOUTH EVERYDAY AT BEDTIME 30 tablet 11  . niacinamide 500 MG tablet Take 500 mg by mouth daily. Per dermatology    . Omega-3 Fatty Acids (  FISH OIL) 1200 MG CAPS Take 1 capsule by mouth daily.    . ondansetron (ZOFRAN) 4 MG tablet Take 1 tablet (4 mg total) by mouth every 6 (six) hours as needed for nausea. 20 tablet 0  . pantoprazole (PROTONIX) 20 MG tablet Take 20 mg by mouth every other day. While on Celebrex and lovenox, does not need at dc    . Probiotic Product (PROBIOTIC PO) Take 1 capsule by mouth daily.     . ranitidine (ZANTAC) 150 MG tablet Take 150 mg by mouth 2 (two) times daily as needed. For acid reflux/indigestion/GERD  12  . sertraline (ZOLOFT) 25 MG tablet Take 25 mg by mouth  every other day. alternate with 1.5 tabs (37.5 mg)  every other day    . sertraline (ZOLOFT) 25 MG tablet Take 37.5 mg by mouth every other day. alternate with 1 tab (25 mg) daily    . vitamin B-12 (CYANOCOBALAMIN) 1000 MCG tablet Take 1,000 mcg by mouth daily.    . celecoxib (CELEBREX) 200 MG capsule Take 1 capsule (200 mg total) by mouth 2 (two) times daily. 28 capsule 0  . HYDROcodone-acetaminophen (NORCO) 7.5-325 MG tablet Take 1-2 tablets by mouth every 6 (six) hours as needed for moderate pain. (Patient not taking: Reported on 08/09/2017) 120 tablet 0  . methocarbamol (ROBAXIN) 500 MG tablet Take 1 tablet (500 mg total) by mouth every 8 (eight) hours as needed for muscle spasms. (Patient not taking: Reported on 08/09/2017) 15 tablet 0   No facility-administered medications prior to visit.      Per HPI unless specifically indicated in ROS section below Review of Systems     Objective:    BP 130/82 (BP Location: Right Arm, Patient Position: Sitting, Cuff Size: Normal)   Pulse 73   Temp 98.5 F (36.9 C) (Oral)   Ht 5' 4.5" (1.638 m) Comment: no shoes  Wt 146 lb 8 oz (66.5 kg)   SpO2 98%   BMI 24.76 kg/m   Wt Readings from Last 3 Encounters:  08/09/17 146 lb 8 oz (66.5 kg)  08/09/17 146 lb 8 oz (66.5 kg)  07/14/17 165 lb 3.2 oz (74.9 kg)    Physical Exam  Constitutional: She is oriented to person, place, and time. She appears well-developed and well-nourished. No distress.  HENT:  Head: Normocephalic and atraumatic.  Right Ear: Hearing, tympanic membrane, external ear and ear canal normal.  Left Ear: Hearing, tympanic membrane, external ear and ear canal normal.  Nose: Nose normal.  Mouth/Throat: Uvula is midline, oropharynx is clear and moist and mucous membranes are normal. No oropharyngeal exudate, posterior oropharyngeal edema or posterior oropharyngeal erythema.  Eyes: Pupils are equal, round, and reactive to light. Conjunctivae and EOM are normal. No scleral icterus.  Neck:  Normal range of motion. Neck supple. Carotid bruit is not present. No thyromegaly present.  Cardiovascular: Normal rate, regular rhythm, normal heart sounds and intact distal pulses.  No murmur heard. Pulses:      Radial pulses are 2+ on the right side, and 2+ on the left side.  Pulmonary/Chest: Effort normal and breath sounds normal. No respiratory distress. She has no wheezes. She has no rales.  Abdominal: Soft. Bowel sounds are normal. She exhibits no distension and no mass. There is no tenderness. There is no rebound and no guarding.  Musculoskeletal: Normal range of motion. She exhibits no edema.  Lymphadenopathy:    She has no cervical adenopathy.  Neurological: She is alert and oriented to person,  place, and time.  CN grossly intact, station and gait intact  Skin: Skin is warm and dry. No rash noted.  Psychiatric: She has a normal mood and affect. Her behavior is normal. Judgment and thought content normal.  Nursing note and vitals reviewed.  Results for orders placed or performed in visit on 08/08/17  VITAMIN D 25 Hydroxy (Vit-D Deficiency, Fractures)  Result Value Ref Range   VITD 43.28 30.00 - 100.00 ng/mL  CBC with Differential/Platelet  Result Value Ref Range   WBC 4.3 4.0 - 10.5 K/uL   RBC 4.43 3.87 - 5.11 Mil/uL   Hemoglobin 12.9 12.0 - 15.0 g/dL   HCT 38.3 36.0 - 46.0 %   MCV 86.5 78.0 - 100.0 fl   MCHC 33.7 30.0 - 36.0 g/dL   RDW 13.4 11.5 - 15.5 %   Platelets 237.0 150.0 - 400.0 K/uL   Neutrophils Relative % 47.7 43.0 - 77.0 %   Lymphocytes Relative 35.7 12.0 - 46.0 %   Monocytes Relative 9.4 3.0 - 12.0 %   Eosinophils Relative 6.0 (H) 0.0 - 5.0 %   Basophils Relative 1.2 0.0 - 3.0 %   Neutro Abs 2.0 1.4 - 7.7 K/uL   Lymphs Abs 1.5 0.7 - 4.0 K/uL   Monocytes Absolute 0.4 0.1 - 1.0 K/uL   Eosinophils Absolute 0.3 0.0 - 0.7 K/uL   Basophils Absolute 0.1 0.0 - 0.1 K/uL  Comprehensive metabolic panel  Result Value Ref Range   Sodium 144 135 - 145 mEq/L    Potassium 4.0 3.5 - 5.1 mEq/L   Chloride 106 96 - 112 mEq/L   CO2 29 19 - 32 mEq/L   Glucose, Bld 107 (H) 70 - 99 mg/dL   BUN 25 (H) 6 - 23 mg/dL   Creatinine, Ser 0.75 0.40 - 1.20 mg/dL   Total Bilirubin 0.3 0.2 - 1.2 mg/dL   Alkaline Phosphatase 141 (H) 39 - 117 U/L   AST 16 0 - 37 U/L   ALT 11 0 - 35 U/L   Total Protein 7.3 6.0 - 8.3 g/dL   Albumin 4.3 3.5 - 5.2 g/dL   Calcium 9.9 8.4 - 10.5 mg/dL   GFR 79.75 >60.00 mL/min  Lipid panel  Result Value Ref Range   Cholesterol 244 (H) 0 - 200 mg/dL   Triglycerides 105.0 0.0 - 149.0 mg/dL   HDL 56.30 >39.00 mg/dL   VLDL 21.0 0.0 - 40.0 mg/dL   LDL Cholesterol 167 (H) 0 - 99 mg/dL   Total CHOL/HDL Ratio 4    NonHDL 187.67       Assessment & Plan:   Problem List Items Addressed This Visit    Advanced care planning/counseling discussion    Advanced directives - reviewed and scanned 08/2017. Lists Marvis Terance Hart and [employer] as HCPOA. Jehova's witness - no blood transfusions but would want minor fractions of blood discussed with her. Does not want life prolonged if hopeless situation.       Closed right hip fracture ([place]) - Primary    Healing well after recent fall with subsequent fracture, s/p hip surgery. Tylenol ineffective for pain. Taking celebrex 200mg  bid - suggested trying to taper given GI upset sxs endorsed. Pt requests hydrocodone refill to use sparingly - #30 sent to pharmacy. Also refilled robaxin.  [place] CSRS reviewed - received #30 5/7 post surgery and another #30 5/11 post surgery.       DJD (degenerative joint disease) of knee   Relevant Medications   celecoxib (CELEBREX)  200 MG capsule   HYDROcodone-acetaminophen (NORCO) 7.5-325 MG tablet   methocarbamol (ROBAXIN) 500 MG tablet   HLD (hyperlipidemia)    Chronic, on fish oil and niacin, and has made diet and exercise changes. LDL remains elevated - will consider statin.  The 10-year ASCVD risk score Mikey Bussing DC Brooke Bonito., et al., 2013) is: 18.5%   Values used to  calculate the score:     Age: 85 years     Sex: Female     Is Non-Hispanic African American: No     Diabetic: No     Tobacco smoker: No     Systolic Blood Pressure: 161 mmHg     Is BP treated: No     HDL Cholesterol: 56.3 mg/dL     Total Cholesterol: 244 mg/dL       MDD (major depressive disorder), recurrent episode, moderate (HCC)    Chronic, stable on current alternating sertraline dose - continue.      Memory deficit    Very mild. Stable period on aricept - continue.       Osteopenia    T score hip -2.4 on latest dexa (08/2016). Encouraged continued calcium/vit D intake and regular weight bearing exercise. Discussed possible future bisphosphonate - will wait for recent hip surgery to heal.      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 Did not take prednisone course. Has had to reschedule GI appt.       Vitamin D deficiency    Continue 5000 units vit D daily.           Meds ordered this encounter  Medications  . HYDROcodone-acetaminophen (NORCO) 7.5-325 MG tablet    Sig: Take 1-2 tablets by mouth every 6 (six) hours as needed for moderate pain.    Dispense:  30 tablet    Refill:  0  . methocarbamol (ROBAXIN) 500 MG tablet    Sig: Take 1 tablet (500 mg total) by mouth 2 (two) times daily as needed for muscle spasms.    Dispense:  30 tablet    Refill:  0   No orders of the defined types were placed in this encounter.   Follow up plan: Return in about 6 months (around 02/08/2018) for follow up visit.  [name], MD

## 2017-08-09 NOTE — Patient Instructions (Addendum)
Work on second shingrix shot.  Robaxin and hydrocodone refilled.  Return in 6 months for follow up visit.  Good to see you today- I'm glad you're recovering well! Health Maintenance, Female Adopting a healthy lifestyle and getting preventive care can go a long way to promote health and wellness. Talk with your health care provider about what schedule of regular examinations is right for you. This is a good chance for you to check in with your provider about disease prevention and staying healthy. In between checkups, there are plenty of things you can do on your own. Experts have done a lot of research about which lifestyle changes and preventive measures are most likely to keep you healthy. Ask your health care provider for more information. Weight and diet Eat a healthy diet  Be sure to include plenty of vegetables, fruits, low-fat dairy products, and lean protein.  Do not eat a lot of foods high in solid fats, added sugars, or salt.  Get regular exercise. This is one of the most important things you can do for your health. ? Most adults should exercise for at least 150 minutes each week. The exercise should increase your heart rate and make you sweat (moderate-intensity exercise). ? Most adults should also do strengthening exercises at least twice a week. This is in addition to the moderate-intensity exercise.  Maintain a healthy weight  Body mass index (BMI) is a measurement that can be used to identify possible weight problems. It estimates body fat based on height and weight. Your health care provider can help determine your BMI and help you achieve or maintain a healthy weight.  For females 43 years of age and older: ? A BMI below 18.5 is considered underweight. ? A BMI of 18.5 to 24.9 is normal. ? A BMI of 25 to 29.9 is considered overweight. ? A BMI of 30 and above is considered obese.  Watch levels of cholesterol and blood lipids  You should start having your blood tested for  lipids and cholesterol at 77 years of age, then have this test every 5 years.  You may need to have your cholesterol levels checked more often if: ? Your lipid or cholesterol levels are high. ? You are older than 77 years of age. ? You are at high risk for heart disease.  Cancer screening Lung Cancer  Lung cancer screening is recommended for adults 2-10 years old who are at high risk for lung cancer because of a history of smoking.  A yearly low-dose CT scan of the lungs is recommended for people who: ? Currently smoke. ? Have quit within the past 15 years. ? Have at least a 30-pack-year history of smoking. A pack year is smoking an average of one pack of cigarettes a day for 1 year.  Yearly screening should continue until it has been 15 years since you quit.  Yearly screening should stop if you develop a health problem that would prevent you from having lung cancer treatment.  Breast Cancer  Practice breast self-awareness. This means understanding how your breasts normally appear and feel.  It also means doing regular breast self-exams. Let your health care provider know about any changes, no matter how small.  If you are in your 20s or 30s, you should have a clinical breast exam (CBE) by a health care provider every 1-3 years as part of a regular health exam.  If you are 18 or older, have a CBE every year. Also consider having a breast  X-ray (mammogram) every year.  If you have a family history of breast cancer, talk to your health care provider about genetic screening.  If you are at high risk for breast cancer, talk to your health care provider about having an MRI and a mammogram every year.  Breast cancer gene (BRCA) assessment is recommended for women who have family members with BRCA-related cancers. BRCA-related cancers include: ? Breast. ? Ovarian. ? Tubal. ? Peritoneal cancers.  Results of the assessment will determine the need for genetic counseling and BRCA1 and  BRCA2 testing.  Cervical Cancer Your health care provider may recommend that you be screened regularly for cancer of the pelvic organs (ovaries, uterus, and vagina). This screening involves a pelvic examination, including checking for microscopic changes to the surface of your cervix (Pap test). You may be encouraged to have this screening done every 3 years, beginning at age 84.  For women ages 24-65, health care providers may recommend pelvic exams and Pap testing every 3 years, or they may recommend the Pap and pelvic exam, combined with testing for human papilloma virus (HPV), every 5 years. Some types of HPV increase your risk of cervical cancer. Testing for HPV may also be done on women of any age with unclear Pap test results.  Other health care providers may not recommend any screening for nonpregnant women who are considered low risk for pelvic cancer and who do not have symptoms. Ask your health care provider if a screening pelvic exam is right for you.  If you have had past treatment for cervical cancer or a condition that could lead to cancer, you need Pap tests and screening for cancer for at least 20 years after your treatment. If Pap tests have been discontinued, your risk factors (such as having a new sexual partner) need to be reassessed to determine if screening should resume. Some women have medical problems that increase the chance of getting cervical cancer. In these cases, your health care provider may recommend more frequent screening and Pap tests.  Colorectal Cancer  This type of cancer can be detected and often prevented.  Routine colorectal cancer screening usually begins at 77 years of age and continues through 77 years of age.  Your health care provider may recommend screening at an earlier age if you have risk factors for colon cancer.  Your health care provider may also recommend using home test kits to check for hidden blood in the stool.  A small camera at the  end of a tube can be used to examine your colon directly (sigmoidoscopy or colonoscopy). This is done to check for the earliest forms of colorectal cancer.  Routine screening usually begins at age 33.  Direct examination of the colon should be repeated every 5-10 years through 77 years of age. However, you may need to be screened more often if early forms of precancerous polyps or small growths are found.  Skin Cancer  Check your skin from head to toe regularly.  Tell your health care provider about any new moles or changes in moles, especially if there is a change in a mole's shape or color.  Also tell your health care provider if you have a mole that is larger than the size of a pencil eraser.  Always use sunscreen. Apply sunscreen liberally and repeatedly throughout the day.  Protect yourself by wearing long sleeves, pants, a wide-brimmed hat, and sunglasses whenever you are outside.  Heart disease, diabetes, and high blood pressure  High blood  pressure causes heart disease and increases the risk of stroke. High blood pressure is more likely to develop in: ? People who have blood pressure in the high end of the normal range (130-139/85-89 mm Hg). ? People who are overweight or obese. ? People who are African American.  If you are 67-37 years of age, have your blood pressure checked every 3-5 years. If you are 29 years of age or older, have your blood pressure checked every year. You should have your blood pressure measured twice-once when you are at a hospital or clinic, and once when you are not at a hospital or clinic. Record the average of the two measurements. To check your blood pressure when you are not at a hospital or clinic, you can use: ? An automated blood pressure machine at a pharmacy. ? A home blood pressure monitor.  If you are between 6 years and 14 years old, ask your health care provider if you should take aspirin to prevent strokes.  Have regular diabetes  screenings. This involves taking a blood sample to check your fasting blood sugar level. ? If you are at a normal weight and have a low risk for diabetes, have this test once every three years after 77 years of age. ? If you are overweight and have a high risk for diabetes, consider being tested at a younger age or more often. Preventing infection Hepatitis B  If you have a higher risk for hepatitis B, you should be screened for this virus. You are considered at high risk for hepatitis B if: ? You were born in a country where hepatitis B is common. Ask your health care provider which countries are considered high risk. ? Your parents were born in a high-risk country, and you have not been immunized against hepatitis B (hepatitis B vaccine). ? You have HIV or AIDS. ? You use needles to inject street drugs. ? You live with someone who has hepatitis B. ? You have had sex with someone who has hepatitis B. ? You get hemodialysis treatment. ? You take certain medicines for conditions, including cancer, organ transplantation, and autoimmune conditions.  Hepatitis C  Blood testing is recommended for: ? Everyone born from [birth year] through 1965. ? Anyone with known risk factors for hepatitis C.  Sexually transmitted infections (STIs)  You should be screened for sexually transmitted infections (STIs) including gonorrhea and chlamydia if: ? You are sexually active and are younger than 77 years of age. ? You are older than 77 years of age and your health care provider tells you that you are at risk for this type of infection. ? Your sexual activity has changed since you were last screened and you are at an increased risk for chlamydia or gonorrhea. Ask your health care provider if you are at risk.  If you do not have HIV, but are at risk, it may be recommended that you take a prescription medicine daily to prevent HIV infection. This is called pre-exposure prophylaxis (PrEP). You are considered at risk  if: ? You are sexually active and do not regularly use condoms or know the HIV status of your partner(s). ? You take drugs by injection. ? You are sexually active with a partner who has HIV.  Talk with your health care provider about whether you are at high risk of being infected with HIV. If you choose to begin PrEP, you should first be tested for HIV. You should then be tested every 3 months for as  long as you are taking PrEP. Pregnancy  If you are premenopausal and you may become pregnant, ask your health care provider about preconception counseling.  If you may become pregnant, take 400 to 800 micrograms (mcg) of folic acid every day.  If you want to prevent pregnancy, talk to your health care provider about birth control (contraception). Osteoporosis and menopause  Osteoporosis is a disease in which the bones lose minerals and strength with aging. This can result in serious bone fractures. Your risk for osteoporosis can be identified using a bone density scan.  If you are 94 years of age or older, or if you are at risk for osteoporosis and fractures, ask your health care provider if you should be screened.  Ask your health care provider whether you should take a calcium or vitamin D supplement to lower your risk for osteoporosis.  Menopause may have certain physical symptoms and risks.  Hormone replacement therapy may reduce some of these symptoms and risks. Talk to your health care provider about whether hormone replacement therapy is right for you. Follow these instructions at home:  Schedule regular health, dental, and eye exams.  Stay current with your immunizations.  Do not use any tobacco products including cigarettes, chewing tobacco, or electronic cigarettes.  If you are pregnant, do not drink alcohol.  If you are breastfeeding, limit how much and how often you drink alcohol.  Limit alcohol intake to no more than 1 drink per day for nonpregnant women. One drink  equals 12 ounces of beer, 5 ounces of wine, or 1 ounces of hard liquor.  Do not use street drugs.  Do not share needles.  Ask your health care provider for help if you need support or information about quitting drugs.  Tell your health care provider if you often feel depressed.  Tell your health care provider if you have ever been abused or do not feel safe at home. This information is not intended to replace advice given to you by your health care provider. Make sure you discuss any questions you have with your health care provider. Document Released: 09/06/2010 Document Revised: 07/30/2015 Document Reviewed: 11/25/2014 Elsevier Interactive Patient Education  [employer].

## 2017-08-10 ENCOUNTER — Encounter: Payer: Self-pay | Admitting: Family Medicine

## 2017-08-10 DIAGNOSIS — F0390 Unspecified dementia without behavioral disturbance: Secondary | ICD-10-CM | POA: Diagnosis not present

## 2017-08-10 DIAGNOSIS — G43909 Migraine, unspecified, not intractable, without status migrainosus: Secondary | ICD-10-CM | POA: Diagnosis not present

## 2017-08-10 DIAGNOSIS — M1991 Primary osteoarthritis, unspecified site: Secondary | ICD-10-CM | POA: Diagnosis not present

## 2017-08-10 DIAGNOSIS — M8580 Other specified disorders of bone density and structure, unspecified site: Secondary | ICD-10-CM | POA: Diagnosis not present

## 2017-08-10 DIAGNOSIS — F331 Major depressive disorder, recurrent, moderate: Secondary | ICD-10-CM | POA: Diagnosis not present

## 2017-08-10 DIAGNOSIS — M80051D Age-related osteoporosis with current pathological fracture, right femur, subsequent encounter for fracture with routine healing: Secondary | ICD-10-CM | POA: Diagnosis not present

## 2017-08-10 NOTE — Assessment & Plan Note (Signed)
This is now resolved.

## 2017-08-10 NOTE — ACP (Advance Care Planning) (Signed)
Advanced directives - reviewed and scanned 08/2017. Lists Kristin Kramer and [employer] as HCPOA. Jehova's witness - no blood transfusions but would want minor fractions of blood discussed with her. Does not want life prolonged if hopeless situation.

## 2017-08-10 NOTE — Assessment & Plan Note (Addendum)
Healing well after recent fall with subsequent fracture, s/p hip surgery. Tylenol ineffective for pain. Taking celebrex 200mg  bid - suggested trying to taper given GI upset sxs endorsed. Pt requests hydrocodone refill to use sparingly - #30 sent to pharmacy. Also refilled robaxin.   CSRS reviewed - received #30 5/7 post surgery and another #30 5/11 post surgery.

## 2017-08-10 NOTE — Progress Notes (Signed)
I reviewed health advisor's note, was available for consultation, and agree with documentation and plan.  

## 2017-08-10 NOTE — Assessment & Plan Note (Signed)
Advanced directives - reviewed and scanned 08/2017. Lists Kristin Kramer and [employer] as HCPOA. Jehova's witness - no blood transfusions but would want minor fractions of blood discussed with her. Does not want life prolonged if hopeless situation.

## 2017-08-10 NOTE — Assessment & Plan Note (Signed)
Continue 5000 units vit D daily.

## 2017-08-10 NOTE — Assessment & Plan Note (Signed)
Chronic, stable on current alternating sertraline dose - continue.

## 2017-08-10 NOTE — Assessment & Plan Note (Addendum)
T score hip -2.4 on latest dexa (08/2016). Encouraged continued calcium/vit D intake and regular weight bearing exercise. Discussed possible future bisphosphonate - will wait for recent hip surgery to heal.

## 2017-08-10 NOTE — Assessment & Plan Note (Addendum)
Bowel changes have improved, however some RQL discomfort persists. Did not take prednisone course. Has had to reschedule GI appt.

## 2017-08-10 NOTE — Assessment & Plan Note (Signed)
resolved 

## 2017-08-10 NOTE — Assessment & Plan Note (Signed)
Very mild. Stable period on aricept - continue.

## 2017-08-25 ENCOUNTER — Encounter (INDEPENDENT_AMBULATORY_CARE_PROVIDER_SITE_OTHER): Payer: Self-pay | Admitting: Physician Assistant

## 2017-08-25 ENCOUNTER — Ambulatory Visit (INDEPENDENT_AMBULATORY_CARE_PROVIDER_SITE_OTHER): Payer: Medicare Other | Admitting: Orthopaedic Surgery

## 2017-08-25 ENCOUNTER — Ambulatory Visit (INDEPENDENT_AMBULATORY_CARE_PROVIDER_SITE_OTHER): Payer: Medicare Other | Admitting: Physician Assistant

## 2017-08-25 ENCOUNTER — Ambulatory Visit (INDEPENDENT_AMBULATORY_CARE_PROVIDER_SITE_OTHER): Payer: Medicare Other

## 2017-08-25 DIAGNOSIS — S72001A Fracture of unspecified part of neck of right femur, initial encounter for closed fracture: Secondary | ICD-10-CM

## 2017-08-25 NOTE — Progress Notes (Signed)
Post-Op Visit Note   Patient: Kristin Kramer           Date of Birth: 1940/06/01           MRN: [id number] Visit Date: 08/25/2017 PCP: [name], MD   Assessment & Plan:  Chief Complaint:  Chief Complaint  Patient presents with  . Right Hip - Pain, Follow-up   Visit Diagnoses:  1. Closed fracture of right hip, initial encounter [facility])     Plan: Patient is a pleasant 77 year old female who presents to our clinic today 48 days status post right hip IM nail, date of surgery 07/08/2017.  She has been doing excellent since surgery.  She has finished physical therapy.  She notes occasional pain at the end of the day if she has been up on her feet a lot.  Otherwise doing well.  Examination of her right hip reveals a well-healed incision.  Full hip flexion and knee range of motion.  Negative logroll.  She is neurovascularly intact distally.  At this point, she will continue to work on endurance exercises.  She will follow-up with [place] in 6 weeks time for repeat x-ray.  Follow-Up Instructions: Return in about 6 weeks (around 10/06/2017).   Orders:  Orders Placed This Encounter  Procedures  . XR FEMUR, MIN 2 VIEWS RIGHT   No orders of the defined types were placed in this encounter.   Imaging: Xr Femur, Min 2 Views Right  Result Date: 08/25/2017 Stable alignment of the fracture.  No collapse of the femoral head.   PMFS History: Patient Active Problem List   Diagnosis Date Noted  . Status post hip surgery 07/15/2017  . Closed right hip fracture ([place]) 07/07/2017  . Chronic nasal congestion 04/19/2017  . Exertional dyspnea 04/19/2017  . Memory deficit 08/05/2016  . 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 Advanced care planning/counseling discussion 07/31/2014  . Diarrhea 07/31/2014  . 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  . Post-operative nausea and vomiting   . Refusal of blood transfusions as patient is Jehovah's Witness   . Medicare  annual wellness visit, subsequent 01/14/2013  . Transaminitis 01/03/2013  . 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 Osteopenia   . HLD (hyperlipidemia)   . Vitamin D deficiency   . Seasonal allergies   . Squamous cell skin cancer   . MDD (major depressive disorder), recurrent episode, moderate ([place])   . Migraines   . Abnormal EKG    Past Medical History:  Diagnosis Date  . Abdominal pain, other specified site 10.2014   [facility] ER - mesenteric adenitis vs sclerosing mesenteritis vs nonspecific lymphadenitis  . Abnormal ECG    a. left axis deviation;  b. 12/2011 Echo: EF 55-60%, no rwma, pasp 85mmHg.  . Arthritis   . Depression    prior on lexapro, celexa, wellbutrin.  high cymbalta doses cause tremors  . H/O seasonal allergies   . Herpes zoster 02/19/2014  . History of pneumonia   . History of UTI    12/2011 - Treatment began preoperatively with CIPRO 500mg  BID  . HLD (hyperlipidemia)    statin intolerance  . Knee osteoarthritis 2013, 2014   bilateral s/p B TKR [facility] [place])  . Knee osteoarthritis   . 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 Migraines   . Osteopenia 08/2016   hip -2.4, spine -1.4  . Post-operative nausea and vomiting   . Refusal of blood transfusions as patient is  Jehovah's Witness   . Seasonal allergies    cats, dust, mold, roaches  . 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 Isac Sarna), R tibia - then recurrent  . Vitamin D deficiency     Family History  Problem Relation Age of Onset  . Cancer Mother 20       lung, smoker  . Dementia Mother 73  . Cancer Father        lung, smoker  . Cancer Maternal Grandmother 9       leukemia  . Coronary artery disease Paternal Grandmother 34       sudden cardiac death  . Diabetes Neg Hx   . Stroke Neg Hx     Past Surgical History:  Procedure Laterality Date  . BREAST ENHANCEMENT  SURGERY    . BUNIONECTOMY  2013   R and L foot  . CATARACT EXTRACTION  2013   R, pending L  . COLONOSCOPY  2007   no records received  . COLONOSCOPY  08/2014   tubular adenoma, rpt 5 yrs [facility])  . COSMETIC SURGERY  2002   face lift  . dexa  06/2008   T -1.4 spine and hip  . ETHMOIDECTOMY Bilateral 05/19/2016   Procedure: ETHMOIDECTOMY;  Surgeon: Carloyn Manner, MD;  Location: [facility] ORS;  Service: ENT;  Laterality: Bilateral;  . EYE SURGERY     bilateral cataract surgery  . FRONTAL SINUS EXPLORATION Bilateral 05/19/2016   Procedure: FRONTAL SINUS EXPLORATION;  Surgeon: Carloyn Manner, MD;  Location: [facility] ORS;  Service: ENT;  Laterality: Bilateral;  . INTRAMEDULLARY (IM) NAIL INTERTROCHANTERIC Right 07/08/2017   Procedure: RIGHT INTERTROCHANTRIC IM NAIL;  Surgeon: Leandrew Koyanagi, MD;  Location: [place];  Service: Orthopedics;  Laterality: Right;  . MAXILLARY ANTROSTOMY Bilateral 05/19/2016   Procedure: MAXILLARY ANTROSTOMY;  Surgeon: Carloyn Manner, MD;  Location: [facility] ORS;  Service: ENT;  Laterality: Bilateral;  . MOHS SURGERY  2011   R leg  . PARTIAL HYSTERECTOMY  1980   fibroids, ovaries remained  . SINUS ENDO W/FUSION N/A 05/19/2016   Procedure: ENDOSCOPIC SINUS SURGERY WITH NAVIGATION;  Surgeon: Carloyn Manner, MD;  Location: [facility] ORS;  Service: ENT;  Laterality: N/A;  . SPHENOIDECTOMY Bilateral 05/19/2016   Procedure: Coralee Pesa;  Surgeon: Carloyn Manner, MD;  Location: [facility] ORS;  Service: ENT;  Laterality: Bilateral;  . SQUAMOUS CELL CARCINOMA EXCISION Right 07/13/2016   Dr. Rolm Bookbinder, [facility]  . TONSILLECTOMY AND ADENOIDECTOMY  1945  . TOTAL KNEE ARTHROPLASTY  01/02/2012   Right - Surgeon: Lorn Junes, MD  . TOTAL KNEE ARTHROPLASTY Left 03/18/2013   Surgeon: Lorn Junes, MD   Social History   Occupational History  . Occupation: Retired  Tobacco Use  . Smoking status: Never Smoker  . Smokeless tobacco: Never Used  Substance and Sexual  Activity  . Alcohol use: Yes    Alcohol/week: 0.0 oz    Comment: occasional // 5-6 drinks per month  . Drug use: No  . Sexual activity: Not Currently

## 2017-08-29 DIAGNOSIS — H1852 Epithelial (juvenile) corneal dystrophy: Secondary | ICD-10-CM | POA: Diagnosis not present

## 2017-08-29 DIAGNOSIS — Z9841 Cataract extraction status, right eye: Secondary | ICD-10-CM | POA: Diagnosis not present

## 2017-08-29 DIAGNOSIS — Z9842 Cataract extraction status, left eye: Secondary | ICD-10-CM | POA: Diagnosis not present

## 2017-08-29 DIAGNOSIS — H5203 Hypermetropia, bilateral: Secondary | ICD-10-CM | POA: Diagnosis not present

## 2017-08-29 DIAGNOSIS — H52223 Regular astigmatism, bilateral: Secondary | ICD-10-CM | POA: Diagnosis not present

## 2017-09-14 ENCOUNTER — Encounter: Payer: Self-pay | Admitting: Gastroenterology

## 2017-09-14 ENCOUNTER — Ambulatory Visit (INDEPENDENT_AMBULATORY_CARE_PROVIDER_SITE_OTHER): Payer: Medicare Other | Admitting: Orthopaedic Surgery

## 2017-09-14 ENCOUNTER — Ambulatory Visit (INDEPENDENT_AMBULATORY_CARE_PROVIDER_SITE_OTHER): Payer: Medicare Other

## 2017-09-14 ENCOUNTER — Encounter (INDEPENDENT_AMBULATORY_CARE_PROVIDER_SITE_OTHER): Payer: Self-pay | Admitting: Orthopaedic Surgery

## 2017-09-14 ENCOUNTER — Ambulatory Visit (INDEPENDENT_AMBULATORY_CARE_PROVIDER_SITE_OTHER): Payer: Medicare Other | Admitting: Gastroenterology

## 2017-09-14 ENCOUNTER — Other Ambulatory Visit: Payer: Medicare Other

## 2017-09-14 ENCOUNTER — Encounter (INDEPENDENT_AMBULATORY_CARE_PROVIDER_SITE_OTHER): Payer: Self-pay

## 2017-09-14 VITALS — BP 130/70 | HR 72 | Ht 65.0 in | Wt 145.4 lb

## 2017-09-14 DIAGNOSIS — R1031 Right lower quadrant pain: Secondary | ICD-10-CM

## 2017-09-14 DIAGNOSIS — R197 Diarrhea, unspecified: Secondary | ICD-10-CM | POA: Diagnosis not present

## 2017-09-14 DIAGNOSIS — S72001A Fracture of unspecified part of neck of right femur, initial encounter for closed fracture: Secondary | ICD-10-CM | POA: Diagnosis not present

## 2017-09-14 DIAGNOSIS — Z86010 Personal history of colonic polyps: Secondary | ICD-10-CM

## 2017-09-14 MED ORDER — GLYCOPYRROLATE 1 MG PO TABS: 1.0000 mg | ORAL_TABLET | Freq: Two times a day (BID) | ORAL | 11 refills | Status: DC

## 2017-09-14 NOTE — Addendum Note (Signed)
Addended by: Precious Bard on: 09/14/2017 11:05 AM   Modules accepted: Orders

## 2017-09-14 NOTE — Progress Notes (Signed)
Post-Op Visit Note   Patient: Kristin Kramer           Date of Birth: Dec 06, 1940           MRN: [id number] Visit Date: 09/14/2017 PCP: [name], MD   Assessment & Plan:  Chief Complaint:  Chief Complaint  Patient presents with  . Right Hip - Pain, Follow-up   Visit Diagnoses:  1. Closed fracture of right hip, initial encounter [facility])     Plan: Patient is status post intramedullary fixation of a right intertrochanteric hip fracture.  She states that she has had a recent worsening and pain in her groin.  Is also radiates into the thigh.  Her pain is mild.  She is damp with a cane outdoors.  Denies any injuries.  She is also complaining of some lateral hip pain. On exam her surgical scars are fully healed.  She does have pain with rotation of the hip that localizes to the groin area.  Her lateral hip is also slightly tender to palpation.  From my standpoint I think she may be having an exacerbation of her underlying hip osteoarthritis.  I do not see any evidence of complications regarding the fracture healing of the hardware.  I would like to try an intra-articular injection with Dr. Ernestina Patches as soon as possible see if this will give her some relief.  I think her lateral hip pain is from the incision and from the trauma from the surgery.  I will hold off on this injection.  Follow-Up Instructions: Return if symptoms worsen or fail to improve.   Orders:  Orders Placed This Encounter  Procedures  . XR FEMUR, MIN 2 VIEWS RIGHT   No orders of the defined types were placed in this encounter.   Imaging: Xr Femur, Min 2 Views Right  Result Date: 09/14/2017 Stable intertrochanteric fracture with evidence of healing.  No hardware complications.  No evidence of cut out.  Small inferior femoral head osteophyte and mild osteoarthritis of the hip joint.   PMFS History: Patient Active Problem List   Diagnosis Date Noted  . Status post hip surgery 07/15/2017  . Closed right hip  fracture ([place]) 07/07/2017  . Chronic nasal congestion 04/19/2017  . Exertional dyspnea 04/19/2017  . Memory deficit 08/05/2016  . 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 Advanced care planning/counseling discussion 07/31/2014  . Diarrhea 07/31/2014  . 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  . Post-operative nausea and vomiting   . Refusal of blood transfusions as patient is Jehovah's Witness   . Medicare annual wellness visit, subsequent 01/14/2013  . Transaminitis 01/03/2013  . 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 Osteopenia   . HLD (hyperlipidemia)   . Vitamin D deficiency   . Seasonal allergies   . Squamous cell skin cancer   . MDD (major depressive disorder), recurrent episode, moderate ([place])   . Migraines   . Abnormal EKG    Past Medical History:  Diagnosis Date  . Abdominal pain, other specified site 10.2014   [facility] ER - mesenteric adenitis vs sclerosing mesenteritis vs nonspecific lymphadenitis  . Abnormal ECG    a. left axis deviation;  b. 12/2011 Echo: EF 55-60%, no rwma, pasp 57mmHg.  . Arthritis   . Depression    prior on lexapro, celexa, wellbutrin.  high cymbalta doses cause tremors  . H/O seasonal allergies   . Herpes zoster 02/19/2014  . History of pneumonia   . History of UTI  12/2011 - Treatment began preoperatively with CIPRO 500mg  BID  . HLD (hyperlipidemia)    statin intolerance  . Knee osteoarthritis 2013, 2014   bilateral s/p B TKR [facility])  . Knee osteoarthritis   . 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 Migraines   . Osteopenia 08/2016   hip -2.4, spine -1.4  . Post-operative nausea and vomiting   . Refusal of blood transfusions as patient is Jehovah's Witness   . Seasonal allergies    cats, dust, mold, roaches  . 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 Isac Sarna), R  tibia - then recurrent  . Tubular adenoma of colon 08/2014  . Vitamin D deficiency     Family History  Problem Relation Age of Onset  . Cancer Mother 22       lung, smoker  . Dementia Mother 33  . Cancer Father        lung, smoker  . Cancer Maternal Grandmother 47       leukemia  . Coronary artery disease Paternal Grandmother 1       sudden cardiac death  . Diabetes Neg Hx   . Stroke Neg Hx     Past Surgical History:  Procedure Laterality Date  . BREAST ENHANCEMENT SURGERY    . BUNIONECTOMY  2013   R and L foot  . CATARACT EXTRACTION  2013   R, pending L  . COLONOSCOPY  2007   no records received  . COLONOSCOPY  08/2014   tubular adenoma, rpt 5 yrs [facility] Plan)  . COSMETIC SURGERY  2002   face lift  . dexa  06/2008   T -1.4 spine and hip  . ETHMOIDECTOMY Bilateral 05/19/2016   Procedure: ETHMOIDECTOMY;  Surgeon: Carloyn Manner, MD;  Location: [facility]S;  Service: ENT;  Laterality: Bilateral;  . EYE SURGERY     bilateral cataract surgery  . FRONTAL SINUS EXPLORATION Bilateral 05/19/2016   Procedure: FRONTAL SINUS EXPLORATION;  Surgeon: Carloyn Manner, MD;  Location: [facility];  Service: ENT;  Laterality: Bilateral;  . INTRAMEDULLARY (IM) NAIL INTERTROCHANTERIC Right 07/08/2017   Procedure: RIGHT INTERTROCHANTRIC IM NAIL;  Surgeon: Leandrew Koyanagi, MD;  Location: [place];  Service: Orthopedics;  Laterality: Right;  . MAXILLARY ANTROSTOMY Bilateral 05/19/2016   Procedure: MAXILLARY ANTROSTOMY;  Surgeon: Carloyn Manner, MD;  Location: [facility];  Service: ENT;  Laterality: Bilateral;  . MOHS SURGERY  2011   R leg  . PARTIAL HYSTERECTOMY  1980   fibroids, ovaries remained  . SINUS ENDO W/FUSION N/A 05/19/2016   Procedure: ENDOSCOPIC SINUS SURGERY WITH NAVIGATION;  Surgeon: Carloyn Manner, MD;  Location: [facility];  Service: ENT;  Laterality: N/A;  . SPHENOIDECTOMY Bilateral 05/19/2016   Procedure: Coralee Pesa;  Surgeon: Carloyn Manner, MD;  Location: [facility];  Service: ENT;   Laterality: Bilateral;  . SQUAMOUS CELL CARCINOMA EXCISION Right 07/13/2016   Dr. Rolm Bookbinder, [facility]  . TONSILLECTOMY AND ADENOIDECTOMY  1945  . TOTAL KNEE ARTHROPLASTY  01/02/2012   Right - Surgeon: Lorn Junes, MD  . TOTAL KNEE ARTHROPLASTY Left 03/18/2013   Surgeon: Lorn Junes, MD   Social History   Occupational History  . Occupation: Retired  Tobacco Use  . Smoking status: Never Smoker  . Smokeless tobacco: Never Used  Substance and Sexual Activity  . Alcohol use: Yes    Alcohol/week: 0.0 oz  Comment: occasional // 5-6 drinks per month  . Drug use: No  . Sexual activity: Not Currently

## 2017-09-14 NOTE — Patient Instructions (Signed)
Your provider has requested that you go to the basement level for lab work before leaving today. Press "B" on the elevator. The lab is located at the first door on the left as you exit the elevator.  We have sent the following medications to your pharmacy for you to pick up at your convenience: robinul.   You can take over the counter Imodium three times a day as needed for diarrhea.  Start a lactose free diet x 7 days, if no improvement in your symptoms, then start a sugar free diet x 7 days, then if still no improvement in symptoms start a low fod-map diet.   Normal BMI (Body Mass Index- based on height and weight) is between 23 and 30. Your BMI today is Body mass index is 24.2 kg/m. Marland Kitchen Please consider follow up  regarding your BMI with your Primary Care Provider.  Thank you for choosing me and [facility].  Pricilla Riffle. Dagoberto Ligas., MD., Marval Regal

## 2017-09-14 NOTE — Progress Notes (Signed)
    History of Present Illness: This is a 77 year old female with diarrhea and abdominal pain. Evaluation in 2016 for diarrhea included a negative fecal lactoferrin, GI pathogen panel, tTG, and colonoscopy.  She relates that she has had intermittent difficulties with diarrhea since the time of her colonoscopy however over the past 6 months her diarrhea has become more frequent with rare episodes of incontinence she relates generally soft stools occurring about 3-5 times per day.  Occasionally she will have a watery stool.  She denies recent antibiotic usage or recent travel.  She also relates a 51-month history of a mild dull nagging right lower quadrant right pelvic pain.  The pain does not change with meals, bowel movements, time of day, positioning.  Denies weight loss, constipation, change in stool caliber, melena, hematochezia, nausea, vomiting, dysphagia, reflux symptoms, chest pain.  Abd/pelvic CT IMPRESSION 07/2017: No acute findings in the abdomen or pelvis. Stable findings of chronic sclerosing mesenteritis.  Colonoscopy 08/2014: tubular adenoma, random biopsies normal  Current Medications, Allergies, Past Medical History, Past Surgical History, Family History and Social History were reviewed in [employer] record.  Physical Exam: General: Well developed, well nourished, no acute distress Head: Normocephalic and atraumatic Eyes:  sclerae anicteric, EOMI Ears: Normal auditory acuity Mouth: No deformity or lesions Lungs: Clear throughout to auscultation Heart: Regular rate and rhythm; no murmurs, rubs or bruits Abdomen: Soft, non tender and non distended. No masses, hepatosplenomegaly or hernias noted. Normal Bowel sounds Rectal: Not done Musculoskeletal: Symmetrical with no gross deformities  Pulses:  Normal pulses noted Extremities: No clubbing, cyanosis, edema or deformities noted Neurological: Alert oriented x 4, grossly nonfocal Psychological:  Alert and  cooperative. Normal mood and affect  Assessment and Recommendations:  1. Diarrhea.  Rule out food intolerance such as lactose or high sugar products.  Suspected functional diarrhea or food intolerance.  GI pathogen panel.  Lactose-free diet for 7 days if not effective then avoid additional simple sugars in diet for 7 days.  If this is not effective trial of a low FODMAP diet.  Glycopyrrolate 1 mg twice daily.  Imodium 3 times daily as needed.  If above unhelpful obtain a fecal elastase and if negative consider a trial of antibiotics for possible SIBO. REV in 6 weeks.  2. 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 REV in 6 weeks.  Personal history of adenomatous colon polyps.  A 5-year interval surveillance colonoscopy  3.  Personal history of adenomatous colon polyps.  A 5-year interval surveillance colonoscopy is recommended in June 2021.

## 2017-09-18 ENCOUNTER — Other Ambulatory Visit: Payer: Self-pay | Admitting: *Deleted

## 2017-09-18 ENCOUNTER — Telehealth: Payer: Self-pay | Admitting: Family Medicine

## 2017-09-18 MED ORDER — DONEPEZIL HCL 5 MG PO TABS: 5.0000 mg | ORAL_TABLET | Freq: Every day | ORAL | 6 refills | Status: DC

## 2017-09-18 NOTE — Telephone Encounter (Signed)
Rx refilled per protocol- LOV: 08/09/17

## 2017-09-18 NOTE — Telephone Encounter (Signed)
Copied from [place] [id number]. Topic: Quick Communication - Rx Refill/Question >> Sep 18, 2017  9:18 AM Synthia Innocent wrote: Medication: donepezil (ARICEPT) 5 MG tablet   Has the patient contacted their pharmacy? Yes.   (Agent: If no, request that the patient contact the pharmacy for the refill.) (Agent: If yes, when and what did the pharmacy advise?)  Preferred Pharmacy (with phone number or street name): [facility] on [address]  Agent: Please be advised that RX refills may take up to 3 business days. We ask that you follow-up with your pharmacy.

## 2017-09-19 ENCOUNTER — Other Ambulatory Visit: Payer: Medicare Other

## 2017-09-19 DIAGNOSIS — R197 Diarrhea, unspecified: Secondary | ICD-10-CM | POA: Diagnosis not present

## 2017-09-19 DIAGNOSIS — R1031 Right lower quadrant pain: Secondary | ICD-10-CM | POA: Diagnosis not present

## 2017-09-21 LAB — GASTROINTESTINAL PATHOGEN PANEL PCR
C. DIFFICILE TOX A/B, PCR: NOT DETECTED
Campylobacter, PCR: NOT DETECTED
Cryptosporidium, PCR: NOT DETECTED
E COLI (ETEC) LT/ST, PCR: NOT DETECTED
E COLI (STEC) STX1/STX2, PCR: NOT DETECTED
E COLI 0157, PCR: NOT DETECTED
Giardia lamblia, PCR: NOT DETECTED
Norovirus, PCR: NOT DETECTED
Rotavirus A, PCR: NOT DETECTED
SALMONELLA, PCR: NOT DETECTED
SHIGELLA, PCR: NOT DETECTED

## 2017-09-27 ENCOUNTER — Encounter (INDEPENDENT_AMBULATORY_CARE_PROVIDER_SITE_OTHER): Payer: Self-pay | Admitting: Physical Medicine and Rehabilitation

## 2017-09-27 ENCOUNTER — Ambulatory Visit (INDEPENDENT_AMBULATORY_CARE_PROVIDER_SITE_OTHER): Payer: Self-pay

## 2017-09-27 ENCOUNTER — Ambulatory Visit (INDEPENDENT_AMBULATORY_CARE_PROVIDER_SITE_OTHER): Payer: Medicare Other | Admitting: Physical Medicine and Rehabilitation

## 2017-09-27 DIAGNOSIS — M25551 Pain in right hip: Secondary | ICD-10-CM

## 2017-09-27 MED ORDER — TRIAMCINOLONE ACETONIDE 40 MG/ML IJ SUSP
80.0000 mg | INTRAMUSCULAR | Status: AC | PRN
  Administered 2017-09-27: 80 mg via INTRA_ARTICULAR

## 2017-09-27 MED ORDER — BUPIVACAINE HCL 0.5 % IJ SOLN
3.0000 mL | INTRAMUSCULAR | Status: AC | PRN
  Administered 2017-09-27: 3 mL via INTRA_ARTICULAR

## 2017-09-27 NOTE — Progress Notes (Signed)
 .  Numeric Pain Rating Scale and Functional Assessment Average Pain 3   In the last MONTH (on 0-10 scale) has pain interfered with the following?  1. General activity like being  able to carry out your everyday physical activities such as walking, climbing stairs, carrying groceries, or moving a chair?  Rating(5)  -Dye Allergies.

## 2017-09-27 NOTE — Patient Instructions (Signed)

## 2017-09-27 NOTE — Progress Notes (Signed)
Kristin Kramer - 77 y.o. female MRN [id number]  Date of birth: 1941-01-27  Office Visit Note: Visit Date: 09/27/2017 PCP: [name], MD Referred by: [name], MD  Subjective: Chief Complaint  Patient presents with  . Right Hip - Pain   HPI: Kristin Kramer is a 77 year old female status post intramedullary fixation of a right intertrochanteric hip fracture now with worsening pain with weightbearing fingers and of the right hip and groin.  She reports her average pain is 3 out of 10.   ROS Otherwise per HPI.  Assessment & Plan: Visit Diagnoses:  1. Pain in right hip     Plan: Findings:  Diagnostic note for therapeutic anesthetic hip arthrogram on the right with only minimal relief during the anesthetic phase.    Meds & Orders: No orders of the defined types were placed in this encounter.   Orders Placed This Encounter  Procedures  . Large Joint Inj: R hip joint  . XR C-ARM NO REPORT    Follow-up: Return if symptoms worsen or fail to improve, for Dr. Erlinda Hong.   Procedures: Large Joint Inj: R hip joint on 09/27/2017 2:54 PM Indications: pain and diagnostic evaluation Details: 22 G needle, anterior approach  Arthrogram: Yes  Medications: 3 mL bupivacaine 0.5 %; 80 mg triamcinolone acetonide 40 MG/ML Outcome: tolerated well, no immediate complications  Arthrogram demonstrated excellent flow of contrast throughout the joint surface without extravasation or obvious defect.  The patient had only mild to miimal relief of symptoms during the anesthetic phase of the injection.  Procedure, treatment alternatives, risks and benefits explained, specific risks discussed. Consent was given by the patient. Immediately prior to procedure a time out was called to verify the correct patient, procedure, equipment, support staff and site/side marked as required. Patient was prepped and draped in the usual sterile fashion.      No notes on file   Clinical History: No specialty  comments available.   She reports that she has never smoked. She has never used smokeless tobacco. No results for input(s): HGBA1C, LABURIC in the last 8760 hours.  Objective:  VS:  HT:    WT:   BMI:     BP:   HR: bpm  TEMP: ( )  RESP:  Physical Exam  Ortho Exam Imaging: No results found.  Past Medical/Family/Surgical/Social History: Medications & Allergies reviewed per EMR, new medications updated. Patient Active Problem List   Diagnosis Date Noted  . Status post hip surgery 07/15/2017  . Closed right hip fracture ([place]) 07/07/2017  . Chronic nasal congestion 04/19/2017  . Exertional dyspnea 04/19/2017  . Memory deficit 08/05/2016  . 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 Advanced care planning/counseling discussion 07/31/2014  . Diarrhea 07/31/2014  . 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  . Post-operative nausea and vomiting   . Refusal of blood transfusions as patient is Jehovah's Witness   . Medicare annual wellness visit, subsequent 01/14/2013  . Transaminitis 01/03/2013  . 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 Osteopenia   . HLD (hyperlipidemia)   . Vitamin D deficiency   . Seasonal allergies   . Squamous cell skin cancer   . MDD (major depressive disorder), recurrent episode, moderate ([place])   . Migraines   . Abnormal EKG    Past Medical History:  Diagnosis Date  . Abdominal pain, other specified site 10.2014   [facility] ER - mesenteric adenitis vs sclerosing mesenteritis vs nonspecific lymphadenitis  . Abnormal ECG  a. left axis deviation;  b. 12/2011 Echo: EF 55-60%, no rwma, pasp 61mmHg.  . Arthritis   . Depression    prior on lexapro, celexa, wellbutrin.  high cymbalta doses cause tremors  . H/O seasonal allergies   . Herpes zoster 02/19/2014  . History of pneumonia   . History of UTI    12/2011 - Treatment began preoperatively with CIPRO 500mg  BID  . HLD (hyperlipidemia)    statin intolerance  . Knee  osteoarthritis 2013, 2014   bilateral s/p B TKR [facility])  . Knee osteoarthritis   . 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 Migraines   . Osteopenia 08/2016   hip -2.4, spine -1.4  . Post-operative nausea and vomiting   . Refusal of blood transfusions as patient is Jehovah's Witness   . Seasonal allergies    cats, dust, mold, roaches  . 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 Isac Sarna), R tibia - then recurrent  . Tubular adenoma of colon 08/2014  . Vitamin D deficiency    Family History  Problem Relation Age of Onset  . Cancer Mother 45       lung, smoker  . Dementia Mother 55  . Cancer Father        lung, smoker  . Cancer Maternal Grandmother 16       leukemia  . Coronary artery disease Paternal Grandmother 86       sudden cardiac death  . Diabetes Neg Hx   . Stroke Neg Hx    Past Surgical History:  Procedure Laterality Date  . BREAST ENHANCEMENT SURGERY    . BUNIONECTOMY  2013   R and L foot  . CATARACT EXTRACTION  2013   R, pending L  . COLONOSCOPY  2007   no records received  . COLONOSCOPY  08/2014   tubular adenoma, rpt 5 yrs [facility] Plan)  . COSMETIC SURGERY  2002   face lift  . dexa  06/2008   T -1.4 spine and hip  . ETHMOIDECTOMY Bilateral 05/19/2016   Procedure: ETHMOIDECTOMY;  Surgeon: Carloyn Manner, MD;  Location: [facility];  Service: ENT;  Laterality: Bilateral;  . EYE SURGERY     bilateral cataract surgery  . FRONTAL SINUS EXPLORATION Bilateral 05/19/2016   Procedure: FRONTAL SINUS EXPLORATION;  Surgeon: Carloyn Manner, MD;  Location: [facility];  Service: ENT;  Laterality: Bilateral;  . INTRAMEDULLARY (IM) NAIL INTERTROCHANTERIC Right 07/08/2017   Procedure: RIGHT INTERTROCHANTRIC IM NAIL;  Surgeon: Leandrew Koyanagi, MD;  Location: [facility];  Service: Orthopedics;  Laterality: Right;  . MAXILLARY ANTROSTOMY Bilateral 05/19/2016    Procedure: MAXILLARY ANTROSTOMY;  Surgeon: Carloyn Manner, MD;  Location: [facility];  Service: ENT;  Laterality: Bilateral;  . MOHS SURGERY  2011   R leg  . PARTIAL HYSTERECTOMY  1980   fibroids, ovaries remained  . SINUS ENDO W/FUSION N/A 05/19/2016   Procedure: ENDOSCOPIC SINUS SURGERY WITH NAVIGATION;  Surgeon: Carloyn Manner, MD;  Location: [facility];  Service: ENT;  Laterality: N/A;  . SPHENOIDECTOMY Bilateral 05/19/2016   Procedure: Coralee Pesa;  Surgeon: Carloyn Manner, MD;  Location: [facility];  Service: ENT;  Laterality: Bilateral;  . SQUAMOUS CELL CARCINOMA EXCISION Right 07/13/2016   Dr. Rolm Bookbinder, [facility]  . TONSILLECTOMY AND ADENOIDECTOMY  1945  . TOTAL KNEE ARTHROPLASTY  01/02/2012   Right - Surgeon: Lorn Junes,  MD  . TOTAL KNEE ARTHROPLASTY Left 03/18/2013   Surgeon: Lorn Junes, MD   Social History   Occupational History  . Occupation: Retired  Tobacco Use  . Smoking status: Never Smoker  . Smokeless tobacco: Never Used  Substance and Sexual Activity  . Alcohol use: Yes    Alcohol/week: 0.0 oz    Comment: occasional // 5-6 drinks per month  . Drug use: No  . Sexual activity: Not Currently

## 2017-10-06 ENCOUNTER — Ambulatory Visit (INDEPENDENT_AMBULATORY_CARE_PROVIDER_SITE_OTHER): Payer: Medicare Other | Admitting: Physician Assistant

## 2017-10-06 ENCOUNTER — Encounter (INDEPENDENT_AMBULATORY_CARE_PROVIDER_SITE_OTHER): Payer: Self-pay | Admitting: Physician Assistant

## 2017-10-06 ENCOUNTER — Ambulatory Visit (INDEPENDENT_AMBULATORY_CARE_PROVIDER_SITE_OTHER): Payer: Medicare Other

## 2017-10-06 DIAGNOSIS — M25551 Pain in right hip: Secondary | ICD-10-CM

## 2017-10-06 NOTE — Progress Notes (Signed)
Patient: Kristin Kramer           Date of Birth: 08/08/1940           MRN: [id number] Visit Date: 10/06/2017 PCP: [name], MD   Assessment & Plan:  Chief Complaint:  Chief Complaint  Patient presents with  . Right Hip - Follow-up  . Right Thigh - Pain   Visit Diagnoses:  1. Pain in right hip     Plan: Patient is a pleasant 77 year old female who presents to our clinic today 90 days status post right intertrochanteric IM nail, date of surgery 07/08/2017.  She started having thigh and groin pain over the past several weeks but since having an intra-articular cortisone injection to the right hip with Dr. Ernestina Patches, she is 90% better.  Still has slight thigh pain at times.  Pain is relieved with Tylenol.  At this point, she will advance with activity as tolerated.  She will continue working on strengthening exercises for her right lower extremity.  She will follow-up with [place] in 6 weeks for repeat x-ray.  Follow-Up Instructions: Return in about 6 weeks (around 11/17/2017).   Orders:  Orders Placed This Encounter  Procedures  . XR FEMUR, MIN 2 VIEWS RIGHT   No orders of the defined types were placed in this encounter.   Imaging: Xr Femur, Min 2 Views Right  Result Date: 10/06/2017 Well-healing fracture without evidence of femoral head collapse.   PMFS History: Patient Active Problem List   Diagnosis Date Noted  . Pain in right hip 10/06/2017  . Status post hip surgery 07/15/2017  . Closed right hip fracture ([place]) 07/07/2017  . Chronic nasal congestion 04/19/2017  . Exertional dyspnea 04/19/2017  . Memory deficit 08/05/2016  . 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 Advanced care planning/counseling discussion 07/31/2014  . Diarrhea 07/31/2014  . 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  . Post-operative nausea and vomiting   . Refusal of blood transfusions as patient is Jehovah's Witness   . Medicare annual wellness visit,  subsequent 01/14/2013  . Transaminitis 01/03/2013  . 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 Osteopenia   . HLD (hyperlipidemia)   . Vitamin D deficiency   . Seasonal allergies   . Squamous cell skin cancer   . MDD (major depressive disorder), recurrent episode, moderate ([place])   . Migraines   . Abnormal EKG    Past Medical History:  Diagnosis Date  . Abdominal pain, other specified site 10.2014   [facility] ER - mesenteric adenitis vs sclerosing mesenteritis vs nonspecific lymphadenitis  . Abnormal ECG    a. left axis deviation;  b. 12/2011 Echo: EF 55-60%, no rwma, pasp 48mmHg.  . Arthritis   . Depression    prior on lexapro, celexa, wellbutrin.  high cymbalta doses cause tremors  . H/O seasonal allergies   . Herpes zoster 02/19/2014  . History of pneumonia   . History of UTI    12/2011 - Treatment began preoperatively with CIPRO 500mg  BID  . HLD (hyperlipidemia)    statin intolerance  . Knee osteoarthritis 2013, 2014   bilateral s/p B TKR Noemi Chapel)  . Knee osteoarthritis   . 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 Migraines   . Osteopenia 08/2016   hip -2.4, spine -1.4  . Post-operative nausea and vomiting   . Refusal of blood transfusions as patient is Jehovah's Witness   . Seasonal allergies  cats, dust, mold, roaches  . 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 Isac Sarna), R tibia - then recurrent  . Tubular adenoma of colon 08/2014  . Vitamin D deficiency     Family History  Problem Relation Age of Onset  . Cancer Mother 63       lung, smoker  . Dementia Mother 28  . Cancer Father        lung, smoker  . Cancer Maternal Grandmother 12       leukemia  . Coronary artery disease Paternal Grandmother 28       sudden cardiac death  . Diabetes Neg Hx   . Stroke Neg Hx     Past Surgical History:  Procedure Laterality Date  .  BREAST ENHANCEMENT SURGERY    . BUNIONECTOMY  2013   R and L foot  . CATARACT EXTRACTION  2013   R, pending L  . COLONOSCOPY  2007   no records received  . COLONOSCOPY  08/2014   tubular adenoma, rpt 5 yrs Fuller Plan)  . COSMETIC SURGERY  2002   face lift  . dexa  06/2008   T -1.4 spine and hip  . ETHMOIDECTOMY Bilateral 05/19/2016   Procedure: ETHMOIDECTOMY;  Surgeon: Carloyn Manner, MD;  Location: [facility] ORS;  Service: ENT;  Laterality: Bilateral;  . EYE SURGERY     bilateral cataract surgery  . FRONTAL SINUS EXPLORATION Bilateral 05/19/2016   Procedure: FRONTAL SINUS EXPLORATION;  Surgeon: Carloyn Manner, MD;  Location: [facility];  Service: ENT;  Laterality: Bilateral;  . INTRAMEDULLARY (IM) NAIL INTERTROCHANTERIC Right 07/08/2017   Procedure: RIGHT INTERTROCHANTRIC IM NAIL;  Surgeon: Leandrew Koyanagi, MD;  Location: [place];  Service: Orthopedics;  Laterality: Right;  . MAXILLARY ANTROSTOMY Bilateral 05/19/2016   Procedure: MAXILLARY ANTROSTOMY;  Surgeon: Carloyn Manner, MD;  Location: [facility];  Service: ENT;  Laterality: Bilateral;  . MOHS SURGERY  2011   R leg  . PARTIAL HYSTERECTOMY  1980   fibroids, ovaries remained  . SINUS ENDO W/FUSION N/A 05/19/2016   Procedure: ENDOSCOPIC SINUS SURGERY WITH NAVIGATION;  Surgeon: Carloyn Manner, MD;  Location: [facility];  Service: ENT;  Laterality: N/A;  . SPHENOIDECTOMY Bilateral 05/19/2016   Procedure: Coralee Pesa;  Surgeon: Carloyn Manner, MD;  Location: [facility];  Service: ENT;  Laterality: Bilateral;  . SQUAMOUS CELL CARCINOMA EXCISION Right 07/13/2016   Dr. Rolm Bookbinder, [facility]  . TONSILLECTOMY AND ADENOIDECTOMY  1945  . TOTAL KNEE ARTHROPLASTY  01/02/2012   Right - Surgeon: Lorn Junes, MD  . TOTAL KNEE ARTHROPLASTY Left 03/18/2013   Surgeon: Lorn Junes, MD   Social History   Occupational History  . Occupation: Retired  Tobacco Use  . Smoking status: Never Smoker  . Smokeless tobacco: Never Used    Substance and Sexual Activity  . Alcohol use: Yes    Alcohol/week: 0.0 oz    Comment: occasional // 5-6 drinks per month  . Drug use: No  . Sexual activity: Not Currently

## 2017-10-25 DIAGNOSIS — Z96651 Presence of right artificial knee joint: Secondary | ICD-10-CM | POA: Diagnosis not present

## 2017-11-09 ENCOUNTER — Telehealth (INDEPENDENT_AMBULATORY_CARE_PROVIDER_SITE_OTHER): Payer: Self-pay | Admitting: Orthopaedic Surgery

## 2017-11-09 NOTE — Telephone Encounter (Signed)
Please advise 

## 2017-11-09 NOTE — Telephone Encounter (Signed)
Tramadol ok? #[phone]

## 2017-11-09 NOTE — Telephone Encounter (Signed)
Patient called advised the pain in the right hip is worse. Patient asked if there is something Dr Erlinda Hong can prescribe to her for the pain until her appointment 11/21/17. The number to contact patient is [phone]

## 2017-11-10 ENCOUNTER — Other Ambulatory Visit (INDEPENDENT_AMBULATORY_CARE_PROVIDER_SITE_OTHER): Payer: Self-pay

## 2017-11-10 MED ORDER — TRAMADOL HCL 50 MG PO TABS: 50.0000 mg | ORAL_TABLET | Freq: Two times a day (BID) | ORAL | 0 refills | Status: DC

## 2017-11-10 NOTE — Telephone Encounter (Signed)
Called into pharm.  Called patient no answer, LMOM.

## 2017-11-15 ENCOUNTER — Ambulatory Visit: Payer: Self-pay | Admitting: Gastroenterology

## 2017-11-17 DIAGNOSIS — S0181XA Laceration without foreign body of other part of head, initial encounter: Secondary | ICD-10-CM | POA: Diagnosis not present

## 2017-11-17 DIAGNOSIS — S0990XA Unspecified injury of head, initial encounter: Secondary | ICD-10-CM | POA: Diagnosis not present

## 2017-11-17 DIAGNOSIS — S199XXA Unspecified injury of neck, initial encounter: Secondary | ICD-10-CM | POA: Diagnosis not present

## 2017-11-17 DIAGNOSIS — M542 Cervicalgia: Secondary | ICD-10-CM | POA: Diagnosis not present

## 2017-11-17 DIAGNOSIS — S80811A Abrasion, right lower leg, initial encounter: Secondary | ICD-10-CM | POA: Diagnosis not present

## 2017-11-17 DIAGNOSIS — S59901A Unspecified injury of right elbow, initial encounter: Secondary | ICD-10-CM | POA: Diagnosis not present

## 2017-11-17 DIAGNOSIS — M25521 Pain in right elbow: Secondary | ICD-10-CM | POA: Diagnosis not present

## 2017-11-17 DIAGNOSIS — S5001XA Contusion of right elbow, initial encounter: Secondary | ICD-10-CM | POA: Diagnosis not present

## 2017-11-20 ENCOUNTER — Ambulatory Visit: Payer: Self-pay | Admitting: Gastroenterology

## 2017-11-20 ENCOUNTER — Telehealth: Payer: Self-pay

## 2017-11-20 NOTE — Telephone Encounter (Signed)
No charge. 

## 2017-11-21 ENCOUNTER — Ambulatory Visit (INDEPENDENT_AMBULATORY_CARE_PROVIDER_SITE_OTHER): Payer: Medicare Other | Admitting: Orthopaedic Surgery

## 2017-11-21 ENCOUNTER — Ambulatory Visit (INDEPENDENT_AMBULATORY_CARE_PROVIDER_SITE_OTHER): Payer: Self-pay

## 2017-11-21 ENCOUNTER — Encounter (INDEPENDENT_AMBULATORY_CARE_PROVIDER_SITE_OTHER): Payer: Self-pay | Admitting: Orthopaedic Surgery

## 2017-11-21 DIAGNOSIS — S72001A Fracture of unspecified part of neck of right femur, initial encounter for closed fracture: Secondary | ICD-10-CM | POA: Diagnosis not present

## 2017-11-21 MED ORDER — HYDROCODONE-ACETAMINOPHEN 5-325 MG PO TABS: 1.0000 | ORAL_TABLET | Freq: Every day | ORAL | 0 refills | Status: DC | PRN

## 2017-11-21 NOTE — Progress Notes (Signed)
Office Visit Note   Patient: Kristin Kramer           Date of Birth: [birth year]           MRN: [id number] Visit Date: 11/21/2017              Requested by: [name], MD [address] [place], [place] [address] PCP: [name], MD   Assessment & Plan: Visit Diagnoses:  1. Closed fracture of right hip, initial encounter ([place])     Plan: She had minimal relief from cortisone injection in her right hip joint.  At this point we will like to obtain a CT scan to assess her fracture healing.  Follow-up after the CT scan.  Follow-Up Instructions: Return in about 2 weeks (around 12/05/2017).   Orders:  Orders Placed This Encounter  Procedures  . XR HIP UNILAT W OR W/O PELVIS 2-3 VIEWS RIGHT   Meds ordered this encounter  Medications  . HYDROcodone-acetaminophen (NORCO) 5-325 MG tablet    Sig: Take 1-2 tablets by mouth daily as needed.    Dispense:  10 tablet    Refill:  0      Procedures: No procedures performed   Clinical Data: No additional findings.   Subjective: Chief Complaint  Patient presents with  . Right Hip - Routine Post Op, Follow-up    Oletta comes back today for 1/2 months status post intramedullary fixation of a intertrochanteric hip fracture.  She states that she continues to have constant right groin and thigh pain that is worse with exertion and walking.   Review of Systems   Objective: Vital Signs: There were no vitals taken for this visit.  Physical Exam  Ortho Exam Right hip exam shows pain with straight leg raise that localizes to the groin and thigh region.  She does have moderate discomfort with logroll. Specialty Comments:  No specialty comments available.  Imaging: Xr Hip Unilat W Or W/o Pelvis 2-3 Views Right  Result Date: 11/21/2017 Stable intramedullary fixation.  Fracture appears to be healed.    PMFS History: Patient Active Problem List   Diagnosis Date Noted  . Pain in right hip 10/06/2017  . Status  post hip surgery 07/15/2017  . Closed right hip fracture ([place]) 07/07/2017  . Chronic nasal congestion 04/19/2017  . Exertional dyspnea 04/19/2017  . Memory deficit 08/05/2016  . 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 Advanced care planning/counseling discussion 07/31/2014  . Diarrhea 07/31/2014  . 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  . Post-operative nausea and vomiting   . Refusal of blood transfusions as patient is Jehovah's Witness   . Medicare annual wellness visit, subsequent 01/14/2013  . Transaminitis 01/03/2013  . 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 Osteopenia   . HLD (hyperlipidemia)   . Vitamin D deficiency   . Seasonal allergies   . Squamous cell skin cancer   . MDD (major depressive disorder), recurrent episode, moderate ([place])   . Migraines   . Abnormal EKG    Past Medical History:  Diagnosis Date  . Abdominal pain, other specified site 10.2014   [facility] ER - mesenteric adenitis vs sclerosing mesenteritis vs nonspecific lymphadenitis  . Abnormal ECG    a. left axis deviation;  b. 12/2011 Echo: EF 55-60%, no rwma, pasp 39mmHg.  . Arthritis   . Depression    prior on lexapro, celexa, wellbutrin.  high cymbalta doses cause tremors  . H/O seasonal allergies   .  Herpes zoster 02/19/2014  . History of pneumonia   . History of UTI    12/2011 - Treatment began preoperatively with CIPRO 500mg  BID  . HLD (hyperlipidemia)    statin intolerance  . Knee osteoarthritis 2013, 2014   bilateral s/p B TKR Noemi [facility])  . Knee osteoarthritis   . 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 Migraines   . Osteopenia 08/2016   hip -2.4, spine -1.4  . Post-operative nausea and vomiting   . Refusal of blood transfusions as patient is Jehovah's Witness   . Seasonal allergies    cats, dust, mold, roaches  . 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 Squamous cell skin cancer 2015 multiple, 2016,  2017, 2018   R anterior leg (Dr. Isac Sarna), R tibia - then recurrent  . Tubular adenoma of colon 08/2014  . Vitamin D deficiency     Family History  Problem Relation Age of Onset  . Cancer Mother 74       lung, smoker  . Dementia Mother 63  . Cancer Father        lung, smoker  . Cancer Maternal Grandmother 25       leukemia  . Coronary artery disease Paternal Grandmother 64       sudden cardiac death  . Diabetes Neg Hx   . Stroke Neg Hx     Past Surgical History:  Procedure Laterality Date  . BREAST ENHANCEMENT SURGERY    . BUNIONECTOMY  2013   R and L foot  . CATARACT EXTRACTION  2013   R, pending L  . COLONOSCOPY  2007   no records received  . COLONOSCOPY  08/2014   tubular adenoma, rpt 5 yrs Fuller Plan)  . COSMETIC SURGERY  2002   face lift  . dexa  06/2008   T -1.4 spine and hip  . ETHMOIDECTOMY Bilateral 05/19/2016   Procedure: ETHMOIDECTOMY;  Surgeon: Carloyn Manner, MD;  Location: [facility];  Service: ENT;  Laterality: Bilateral;  . EYE SURGERY     bilateral cataract surgery  . FRONTAL SINUS EXPLORATION Bilateral 05/19/2016   Procedure: FRONTAL SINUS EXPLORATION;  Surgeon: Carloyn Manner, MD;  Location: [facility];  Service: ENT;  Laterality: Bilateral;  . INTRAMEDULLARY (IM) NAIL INTERTROCHANTERIC Right 07/08/2017   Procedure: RIGHT INTERTROCHANTRIC IM NAIL;  Surgeon: Leandrew Koyanagi, MD;  Location: [place];  Service: Orthopedics;  Laterality: Right;  . MAXILLARY ANTROSTOMY Bilateral 05/19/2016   Procedure: MAXILLARY ANTROSTOMY;  Surgeon: Carloyn Manner, MD;  Location: [facility];  Service: ENT;  Laterality: Bilateral;  . MOHS SURGERY  2011   R leg  . PARTIAL HYSTERECTOMY  1980   fibroids, ovaries remained  . SINUS ENDO W/FUSION N/A 05/19/2016   Procedure: ENDOSCOPIC SINUS SURGERY WITH NAVIGATION;  Surgeon: Carloyn Manner, MD;  Location: [facility];  Service: ENT;  Laterality: N/A;  . SPHENOIDECTOMY Bilateral 05/19/2016   Procedure: Coralee Pesa;  Surgeon:  Carloyn Manner, MD;  Location: [facility];  Service: ENT;  Laterality: Bilateral;  . SQUAMOUS CELL CARCINOMA EXCISION Right 07/13/2016   Dr. Rolm Bookbinder, [facility]  . TONSILLECTOMY AND ADENOIDECTOMY  1945  . TOTAL KNEE ARTHROPLASTY  01/02/2012   Right - Surgeon: Lorn Junes, MD  . TOTAL KNEE ARTHROPLASTY Left 03/18/2013   Surgeon: Lorn Junes, MD   Social History   Occupational History  . Occupation: Retired  Tobacco Use  . Smoking status: Never Smoker  . Smokeless tobacco: Never  Used  Substance and Sexual Activity  . Alcohol use: Yes    Alcohol/week: 0.0 standard drinks    Comment: occasional // 5-6 drinks per month  . Drug use: No  . Sexual activity: Not Currently

## 2017-11-21 NOTE — Addendum Note (Signed)
Addended by: Precious Bard on: 11/21/2017 11:21 AM   Modules accepted: Orders

## 2017-11-23 ENCOUNTER — Telehealth: Payer: Self-pay | Admitting: Family Medicine

## 2017-11-23 NOTE — Telephone Encounter (Signed)
plz schedule for tomorrow

## 2017-11-23 NOTE — Telephone Encounter (Signed)
See below crm.  Can pt be worked in or see someone else?  Copied from [place] [id number]. Topic: Quick Communication - See Telephone Encounter >> Nov 23, 2017  9:27 AM Antonieta Iba C wrote: CRM for notification. See Telephone encounter for: 11/23/17.  Pt called in to ask if office could remove her stitches. Pt fell at [facility] on last Friday 11/17/17. Pt was advised by the ED there to have them removed 5-7 days. Please assist.    CB: [phone]

## 2017-11-23 NOTE — Telephone Encounter (Signed)
Appointment 9/20 Pt aware

## 2017-11-23 NOTE — Telephone Encounter (Signed)
Noted  

## 2017-11-24 ENCOUNTER — Encounter: Payer: Self-pay | Admitting: Family Medicine

## 2017-11-24 ENCOUNTER — Ambulatory Visit (INDEPENDENT_AMBULATORY_CARE_PROVIDER_SITE_OTHER)
Admission: RE | Admit: 2017-11-24 | Discharge: 2017-11-24 | Disposition: A | Discharge status: Home / Self Care | Payer: Medicare Other | Source: Ambulatory Visit | Attending: Family Medicine | Admitting: Family Medicine

## 2017-11-24 ENCOUNTER — Ambulatory Visit (INDEPENDENT_AMBULATORY_CARE_PROVIDER_SITE_OTHER): Payer: Medicare Other | Admitting: Family Medicine

## 2017-11-24 VITALS — BP 128/82 | HR 63 | Temp 98.2°F | Ht 65.0 in | Wt 151.5 lb

## 2017-11-24 DIAGNOSIS — R0789 Other chest pain: Secondary | ICD-10-CM

## 2017-11-24 DIAGNOSIS — Z9889 Other specified postprocedural states: Secondary | ICD-10-CM | POA: Diagnosis not present

## 2017-11-24 DIAGNOSIS — S0181XA Laceration without foreign body of other part of head, initial encounter: Secondary | ICD-10-CM

## 2017-11-24 DIAGNOSIS — S299XXA Unspecified injury of thorax, initial encounter: Secondary | ICD-10-CM | POA: Diagnosis not present

## 2017-11-24 DIAGNOSIS — M8580 Other specified disorders of bone density and structure, unspecified site: Secondary | ICD-10-CM | POA: Diagnosis not present

## 2017-11-24 DIAGNOSIS — R0781 Pleurodynia: Secondary | ICD-10-CM | POA: Diagnosis not present

## 2017-11-24 DIAGNOSIS — S2231XA Fracture of one rib, right side, initial encounter for closed fracture: Secondary | ICD-10-CM | POA: Diagnosis not present

## 2017-11-24 NOTE — Patient Instructions (Addendum)
Flu shot today Sutures removed today.  Xray today to evaluate for rib and sternal fracture after fall. Continue tylenol, hydrocodone for breakthrough pain. Use heating pad to chest wall.  If rib fracture or bruise, this can take up to 6 weeks for recovery.

## 2017-11-24 NOTE — Progress Notes (Signed)
BP 128/82 (BP Location: Left Arm, Patient Position: Sitting, Cuff Size: Normal)   Pulse 63   Temp 98.2 F (36.8 C) (Oral)   Ht 5\' 5"  (1.651 m)   Wt 151 lb 8 oz (68.7 kg)   SpO2 96%   BMI 25.21 kg/m    CC: suture removal "I fell again"  Subjective:    Patient ID: Kristin Kramer, female    DOB: Sep 09, 1940, 77 y.o.   MRN: [id number]  HPI: NIAOMI Kramer is a 77 y.o. female presenting on 11/24/2017 for Hospitalization Follow-up (Seen at [facility] ED, in [place], due to a fall on 11/17/17, primary dx facial laceration.  Pt provided summary of visit (made a copy). Pt states she also has chest pain, like a pulled muscle, in right chest behind the breast radiating into her armpit. Hurts to take a deep breath, cough, clear throat, etc. Was experincing during ED visit but did not mention it.) and Suture / Staple Removal (Has 4 sutures above right eye to be removed. Told to have removed in 5-7 days.)   Seen at [facility] ER after fall 11/17/2017, suffered facial laceration with bruise below R eye (needs 4 sutures removed today). Missed a curb walking out of restaurant in pouring rain. She was not using cane with fall. Treating pain with tylenol.   Also having progressively worsening R sided chest pain with radiation into axilla - worse with cough, sneeze, deep breathing. Able to take deep breath. Trouble sleeping due to pain.   She had another fall 5 months ago sustained R hip fracture s/p intertrochanteric IM nail - upcoming MRI on Monday (Dr Erlinda Hong). Tramadol not helping hip pain. Hydrocodone was previously more effective. She is also using CBD oil.   Relevant past medical, surgical, family and social history reviewed and updated as indicated. Interim medical history since our last visit reviewed. Allergies and medications reviewed and updated. Outpatient Medications Prior to Visit  Medication Sig Dispense Refill  . Cholecalciferol (VITAMIN D-3) 5000 units TABS Take 1 tablet by mouth  daily.    Marland Kitchen donepezil (ARICEPT) 5 MG tablet Take 1 tablet (5 mg total) by mouth daily. 30 tablet 6  . glycopyrrolate (ROBINUL) 1 MG tablet Take 1 tablet (1 mg total) by mouth 2 (two) times daily. 60 tablet 11  . HYDROcodone-acetaminophen (NORCO) 5-325 MG tablet Take 1-2 tablets by mouth daily as needed. 10 tablet 0  . loratadine (CLARITIN) 10 MG tablet Take 1 tablet (10 mg total) by mouth daily.    . metroNIDAZOLE (METROCREAM) 0.75 % cream Apply 1 application topically 2 (two) times daily. Applied daily to patient face    . montelukast (SINGULAIR) 10 MG tablet TAKE 1 TABLET BY MOUTH EVERYDAY AT BEDTIME 30 tablet 11  . niacinamide 500 MG tablet Take 500 mg by mouth daily. Per dermatology    . Omega-3 Fatty Acids (FISH OIL) 1200 MG CAPS Take 1 capsule by mouth daily.    . Probiotic Product (PROBIOTIC PO) Take 1 capsule by mouth daily.     . ranitidine (ZANTAC) 150 MG tablet Take 150 mg by mouth 2 (two) times daily as needed. For acid reflux/indigestion/GERD  12  . sertraline (ZOLOFT) 25 MG tablet Take 25 mg by mouth every other day. alternate with 1.5 tabs (37.5 mg)  every other day    . sertraline (ZOLOFT) 25 MG tablet Take 37.5 mg by mouth every other day. alternate with 1 tab (25 mg) daily    .  traMADol (ULTRAM) 50 MG tablet Take 1 tablet (50 mg total) by mouth 2 (two) times daily. 20 tablet 0  . vitamin B-12 (CYANOCOBALAMIN) 1000 MCG tablet Take 1,000 mcg by mouth daily.     No facility-administered medications prior to visit.      Per HPI unless specifically indicated in ROS section below Review of Systems     Objective:    BP 128/82 (BP Location: Left Arm, Patient Position: Sitting, Cuff Size: Normal)   Pulse 63   Temp 98.2 F (36.8 C) (Oral)   Ht 5\' 5"  (1.651 m)   Wt 151 lb 8 oz (68.7 kg)   SpO2 96%   BMI 25.21 kg/m   Wt Readings from Last 3 Encounters:  11/24/17 151 lb 8 oz (68.7 kg)  09/14/17 145 lb 6.4 oz (66 kg)  08/09/17 146 lb 8 oz (66.5 kg)    Physical Exam    Constitutional: She appears well-developed and well-nourished. No distress.  HENT:  Bruising/ecchymosis below R eye  Cardiovascular: Normal rate, regular rhythm and normal heart sounds.  No murmur heard. Pulmonary/Chest: Effort normal and breath sounds normal. No respiratory distress. She has no decreased breath sounds. She has no wheezes. She has no rhonchi. She has no rales. She exhibits tenderness.  Breast implants in place, no obvious rupture Point tender to palpation upper sternum as well as R lateral anterior ribcage     Skin: Bruising and ecchymosis noted.  R lateral eyebrow laceration repaired with 4 sutures which were removed today. Pt tolerated well.   Nursing note and vitals reviewed.     Assessment & Plan:   Problem List Items Addressed This Visit    Status post hip surgery    Ongoing pain after surgery - pending R hip MRI next week.      Right-sided chest wall pain    Anticipate rib strain or contusion r/o fracture - she also has point tenderness at sternum - check films. Discussed anticipated course of recovery 4-6 wks long. Discussed treatment with tylenol, hydrocodone, heating pad. Update if not improving with treatment. Lungs clear today, good O2 sats.       Relevant Orders   DG Ribs Unilateral Right   DG Sternum   Osteopenia   Facial laceration - Primary    4 sutures removed - pt tolerated well. Discussed wound care.           No orders of the defined types were placed in this encounter.  Orders Placed This Encounter  Procedures  . DG Ribs Unilateral Right    Standing Status:   Future    Standing Expiration Date:   01/25/2019    Order Specific Question:   Reason for Exam (SYMPTOM  OR DIAGNOSIS REQUIRED)    Answer:   R lateral rib cage pain after fall    Order Specific Question:   Preferred imaging location?    Answer:   [facility]    Order Specific Question:   Radiology Contrast Protocol - do NOT remove file path    Answer:    [email or web address]  . DG Sternum    Standing Status:   Future    Standing Expiration Date:   01/25/2019    Order Specific Question:   Reason for Exam (SYMPTOM  OR DIAGNOSIS REQUIRED)    Answer:   upper sternal pain after fall    Order Specific Question:   Preferred imaging location?    Answer:   [facility]    Order Specific  Question:   Radiology Contrast Protocol - do NOT remove file path    Answer:   \\[email or web address]    Follow up plan: Return if symptoms worsen or fail to improve.  [name], MD

## 2017-11-24 NOTE — Assessment & Plan Note (Signed)
Ongoing pain after surgery - pending R hip MRI next week.

## 2017-11-24 NOTE — Assessment & Plan Note (Signed)
4 sutures removed - pt tolerated well. Discussed wound care.

## 2017-11-24 NOTE — Assessment & Plan Note (Signed)
Anticipate rib strain or contusion r/o fracture - she also has point tenderness at sternum - check films. Discussed anticipated course of recovery 4-6 wks long. Discussed treatment with tylenol, hydrocodone, heating pad. Update if not improving with treatment. Lungs clear today, good O2 sats.

## 2017-11-25 ENCOUNTER — Encounter: Payer: Self-pay | Admitting: Family Medicine

## 2017-11-27 ENCOUNTER — Ambulatory Visit
Admission: RE | Admit: 2017-11-27 | Discharge: 2017-11-27 | Disposition: A | Discharge status: Home / Self Care | Payer: Medicare Other | Source: Ambulatory Visit | Attending: Orthopaedic Surgery | Admitting: Orthopaedic Surgery

## 2017-11-27 DIAGNOSIS — S72001A Fracture of unspecified part of neck of right femur, initial encounter for closed fracture: Secondary | ICD-10-CM

## 2017-11-27 DIAGNOSIS — S72301K Unspecified fracture of shaft of right femur, subsequent encounter for closed fracture with nonunion: Secondary | ICD-10-CM | POA: Diagnosis not present

## 2017-11-28 ENCOUNTER — Encounter (INDEPENDENT_AMBULATORY_CARE_PROVIDER_SITE_OTHER): Payer: Self-pay | Admitting: Orthopaedic Surgery

## 2017-11-28 ENCOUNTER — Ambulatory Visit (INDEPENDENT_AMBULATORY_CARE_PROVIDER_SITE_OTHER): Payer: Medicare Other | Admitting: Orthopaedic Surgery

## 2017-11-28 ENCOUNTER — Telehealth: Payer: Self-pay | Admitting: Family Medicine

## 2017-11-28 DIAGNOSIS — S72141K Displaced intertrochanteric fracture of right femur, subsequent encounter for closed fracture with nonunion: Secondary | ICD-10-CM | POA: Diagnosis not present

## 2017-11-28 DIAGNOSIS — Z9889 Other specified postprocedural states: Secondary | ICD-10-CM

## 2017-11-28 DIAGNOSIS — M25551 Pain in right hip: Secondary | ICD-10-CM

## 2017-11-28 HISTORY — DX: Displaced intertrochanteric fracture of right femur, subsequent encounter for closed fracture with nonunion: S72.141K

## 2017-11-28 NOTE — Progress Notes (Signed)
Office Visit Note   Patient: Kristin Kramer           Date of Birth: [birth year]           MRN: [id number] Visit Date: 11/28/2017              Requested by: [name], MD [address] [place], [place] [address] PCP: [name], MD   Assessment & Plan: Visit Diagnoses:  1. Closed intertrochanteric fracture with nonunion, right     Plan: CT scan is consistent with a nonunion of the intertrochanteric fracture with slight varus alignment.  The implant is stable.  These findings were discussed with the patient and recommendation is for conversion to a right total hip replacement.  Risks and benefits and rehab and recovery reviewed with the patient.  Patient advised to use a cane or walker for protected weightbearing.  She understands that she needs to have this revised due to the likelihood of the implant failing which would cause significant pain and inability to ambulate.  We will obtain intraoperative cultures to assess for infection.  I would like to proceed with this surgery within the next week or so.  Follow-Up Instructions: Return if symptoms worsen or fail to improve.   Orders:  No orders of the defined types were placed in this encounter.  No orders of the defined types were placed in this encounter.     Procedures: No procedures performed   Clinical Data: No additional findings.   Subjective: Chief Complaint  Patient presents with  . Right Hip - Follow-up    Kristin Kramer follows up today for her right hip CT scan.  She states that she still continues to have chronic right hip pain that is worse with standing and weightbearing and ambulation.  She is currently using a cane.   Review of Systems   Objective: Vital Signs: There were no vitals taken for this visit.  Physical Exam  Ortho Exam Right hip exam shows painful range of motion.  Antalgic gait.  Fully healed surgical scars.  No evidence of infection. Specialty Comments:  No specialty  comments available.  Imaging: No results found.   PMFS History: Patient Active Problem List   Diagnosis Date Noted  . Closed intertrochanteric fracture with nonunion, right 11/28/2017  . Facial laceration 11/24/2017  . Pain in right hip 10/06/2017  . Status post hip surgery 07/15/2017  . Closed right hip fracture ([place]) 07/07/2017  . Chronic nasal congestion 04/19/2017  . Exertional dyspnea 04/19/2017  . Memory deficit 08/05/2016  . 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 Advanced care planning/counseling discussion 07/31/2014  . Diarrhea 07/31/2014  . Right rib fracture 05/21/2014  . 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  . Post-operative nausea and vomiting   . Refusal of blood transfusions as patient is Jehovah's Witness   . Medicare annual wellness visit, subsequent 01/14/2013  . Transaminitis 01/03/2013  . 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 Osteopenia   . HLD (hyperlipidemia)   . Vitamin D deficiency   . Seasonal allergies   . Squamous cell skin cancer   . MDD (major depressive disorder), recurrent episode, moderate ([place])   . Migraines   . Abnormal EKG    Past Medical History:  Diagnosis Date  . Abdominal pain, other specified site 10.2014   [facility] ER - mesenteric adenitis vs sclerosing mesenteritis vs nonspecific lymphadenitis  . Abnormal ECG    a. left axis deviation;  b.  12/2011 Echo: EF 55-60%, no rwma, pasp 17mmHg.  . Arthritis   . Depression    prior on lexapro, celexa, wellbutrin.  high cymbalta doses cause tremors  . H/O seasonal allergies   . Herpes zoster 02/19/2014  . History of pneumonia   . History of UTI    12/2011 - Treatment began preoperatively with CIPRO 500mg  BID  . HLD (hyperlipidemia)    statin intolerance  . Knee osteoarthritis 2013, 2014   bilateral s/p B TKR [facility])  . Knee osteoarthritis   . 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 Migraines   .  Osteopenia 08/2016   hip -2.4, spine -1.4  . Post-operative nausea and vomiting   . Refusal of blood transfusions as patient is Jehovah's Witness   . Seasonal allergies    cats, dust, mold, roaches  . 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 Isac Sarna), R tibia - then recurrent  . Tubular adenoma of colon 08/2014  . Vitamin D deficiency     Family History  Problem Relation Age of Onset  . Cancer Mother 16       lung, smoker  . Dementia Mother 65  . Cancer Father        lung, smoker  . Cancer Maternal Grandmother 69       leukemia  . Coronary artery disease Paternal Grandmother 44       sudden cardiac death  . Diabetes Neg Hx   . Stroke Neg Hx     Past Surgical History:  Procedure Laterality Date  . BREAST ENHANCEMENT SURGERY    . BUNIONECTOMY  2013   R and L foot  . CATARACT EXTRACTION  2013   R, pending L  . COLONOSCOPY  2007   no records received  . COLONOSCOPY  08/2014   tubular adenoma, rpt 5 yrs [facility])  . COSMETIC SURGERY  2002   face lift  . dexa  06/2008   T -1.4 spine and hip  . ETHMOIDECTOMY Bilateral 05/19/2016   Procedure: ETHMOIDECTOMY;  Surgeon: Carloyn Manner, MD;  Location: [facility] ORS;  Service: ENT;  Laterality: Bilateral;  . EYE SURGERY     bilateral cataract surgery  . FRONTAL SINUS EXPLORATION Bilateral 05/19/2016   Procedure: FRONTAL SINUS EXPLORATION;  Surgeon: Carloyn Manner, MD;  Location: [facility] ORS;  Service: ENT;  Laterality: Bilateral;  . INTRAMEDULLARY (IM) NAIL INTERTROCHANTERIC Right 07/08/2017   Procedure: RIGHT INTERTROCHANTRIC IM NAIL;  Surgeon: Leandrew Koyanagi, MD;  Location: [facility];  Service: Orthopedics;  Laterality: Right;  . MAXILLARY ANTROSTOMY Bilateral 05/19/2016   Procedure: MAXILLARY ANTROSTOMY;  Surgeon: Carloyn Manner, MD;  Location: [facility] OR[facility];  Service: ENT;  Laterality: Bilateral;  . MOHS SURGERY  2011   R leg  . PARTIAL  HYSTERECTOMY  1980   fibroids, ovaries remained  . SINUS ENDO W/FUSION N/A 05/19/2016   Procedure: ENDOSCOPIC SINUS SURGERY WITH NAVIGATION;  Surgeon: Carloyn Manner, MD;  Location: [facility] OR[facility];  Service: ENT;  Laterality: N/A;  . SPHENOIDECTOMY Bilateral 05/19/2016   Procedure: Coralee Pesa;  Surgeon: Carloyn Manner, MD;  Location: [facility] OR[facility];  Service: ENT;  Laterality: Bilateral;  . SQUAMOUS CELL CARCINOMA EXCISION Right 07/13/2016   Dr. Rolm Bookbinder, [facility]  . TONSILLECTOMY AND ADENOIDECTOMY  1945  . TOTAL KNEE ARTHROPLASTY  01/02/2012   Right - Surgeon: Lorn Junes, MD  . TOTAL  KNEE ARTHROPLASTY Left 03/18/2013   Surgeon: Lorn Junes, MD   Social History   Occupational History  . Occupation: Retired  Tobacco Use  . Smoking status: Never Smoker  . Smokeless tobacco: Never Used  Substance and Sexual Activity  . Alcohol use: Yes    Alcohol/week: 0.0 standard drinks    Comment: occasional // 5-6 drinks per month  . Drug use: No  . Sexual activity: Not Currently

## 2017-11-28 NOTE — Telephone Encounter (Signed)
Called, no answer. Will try later.  

## 2017-11-28 NOTE — Telephone Encounter (Signed)
Copied from [place] [id number]. Topic: Inquiry >> Nov 28, 2017  4:28 PM Margot Ables wrote: Reason for CRM: pt requesting call from nurse/Dr. G for advice. The surgeon is wanting to do an additional surgery due her hip not healing. She broke her hip 5 months ago and Dr. Erlinda Hong at [facility] is now recommending a complete hip replacement and he is wanting to schedule her for Monday 12/04/17. Pt is asking if she should get a second opinion.

## 2017-11-29 ENCOUNTER — Telehealth (INDEPENDENT_AMBULATORY_CARE_PROVIDER_SITE_OTHER): Payer: Self-pay | Admitting: Orthopaedic Surgery

## 2017-11-29 NOTE — Telephone Encounter (Signed)
Called patient no answer. LMOM to return call 

## 2017-11-29 NOTE — Telephone Encounter (Signed)
Patient aware. She will come tomorrow.

## 2017-11-29 NOTE — Telephone Encounter (Signed)
ok 

## 2017-11-29 NOTE — Telephone Encounter (Signed)
See message below °

## 2017-11-29 NOTE — Telephone Encounter (Signed)
Please make her appt with Ninfa Linden or Marlou Sa ASAP.  Preferably this afternoon or tomorrow

## 2017-11-29 NOTE — Telephone Encounter (Signed)
Patient called would like to receive a second opinion before proceeding with surgery on Monday

## 2017-11-29 NOTE — Telephone Encounter (Signed)
Patient called back to let Dr. Erlinda Hong know she has cancelled appt with Dr. Marlou Sa and will get a second opinion at [facility].  [facility] # [phone]

## 2017-11-29 NOTE — Telephone Encounter (Signed)
MADE WITH DEAN TOMORROW AT 9:30

## 2017-11-29 NOTE — Telephone Encounter (Signed)
Called and spoke w pt. She is aware she will need hip replacement, bone at risk of fracture. She desires a second opinion and would like to see Dr Rudene Christians. Referral placed.

## 2017-11-29 NOTE — Addendum Note (Signed)
Addended by: [name] on: 11/29/2017 02:45 PM   Modules accepted: Orders

## 2017-11-30 ENCOUNTER — Ambulatory Visit (INDEPENDENT_AMBULATORY_CARE_PROVIDER_SITE_OTHER): Payer: Medicare Other | Admitting: Orthopedic Surgery

## 2017-11-30 NOTE — Telephone Encounter (Signed)
Spoke with the patient, requested her to obtain the disc copies of her Xrays and CT scans for second opinion. Faxed second opinion request to [facility] for Dr Rudene Christians to review, they will call the patient and [place] with an appointment if accepted, patient aware.

## 2017-12-04 ENCOUNTER — Encounter (HOSPITAL_COMMUNITY): Admission: RE | Payer: Self-pay | Source: Ambulatory Visit

## 2017-12-04 ENCOUNTER — Inpatient Hospital Stay (HOSPITAL_COMMUNITY): Admission: RE | Admit: 2017-12-04 | Payer: Medicare Other | Source: Ambulatory Visit | Admitting: Orthopaedic Surgery

## 2017-12-04 DIAGNOSIS — S72141K Displaced intertrochanteric fracture of right femur, subsequent encounter for closed fracture with nonunion: Secondary | ICD-10-CM | POA: Diagnosis not present

## 2017-12-04 SURGERY — ARTHROPLASTY, HIP, TOTAL,POSTERIOR APPROACH
Anesthesia: Spinal | Laterality: Right

## 2017-12-05 ENCOUNTER — Encounter: Payer: Self-pay | Admitting: Family Medicine

## 2017-12-05 ENCOUNTER — Encounter
Admission: RE | Admit: 2017-12-05 | Discharge: 2017-12-05 | Disposition: A | Discharge status: Home / Self Care | Payer: Medicare Other | Source: Ambulatory Visit | Attending: Orthopedic Surgery | Admitting: Orthopedic Surgery

## 2017-12-05 ENCOUNTER — Other Ambulatory Visit: Payer: Self-pay

## 2017-12-05 DIAGNOSIS — S72141A Displaced intertrochanteric fracture of right femur, initial encounter for closed fracture: Secondary | ICD-10-CM

## 2017-12-05 DIAGNOSIS — E785 Hyperlipidemia, unspecified: Secondary | ICD-10-CM

## 2017-12-05 DIAGNOSIS — M1710 Unilateral primary osteoarthritis, unspecified knee: Secondary | ICD-10-CM

## 2017-12-05 DIAGNOSIS — X58XXXA Exposure to other specified factors, initial encounter: Secondary | ICD-10-CM | POA: Insufficient documentation

## 2017-12-05 DIAGNOSIS — Z01812 Encounter for preprocedural laboratory examination: Secondary | ICD-10-CM | POA: Insufficient documentation

## 2017-12-05 HISTORY — DX: Prediabetes: R73.03

## 2017-12-05 HISTORY — DX: Gastro-esophageal reflux disease without esophagitis: K21.9

## 2017-12-05 LAB — BASIC METABOLIC PANEL
Anion gap: 11 (ref 5–15)
BUN: 11 mg/dL (ref 8–23)
CHLORIDE: 101 mmol/L (ref 98–111)
CO2: 29 mmol/L (ref 22–32)
Calcium: 9.8 mg/dL (ref 8.9–10.3)
Creatinine, Ser: 0.65 mg/dL (ref 0.44–1.00)
GFR calc Af Amer: >60 mL/min (ref >60)
GFR calc non Af Amer: >60 mL/min (ref >60)
GLUCOSE: 91 mg/dL (ref 70–99)
POTASSIUM: 3.6 mmol/L (ref 3.5–5.1)
Sodium: 141 mmol/L (ref 135–145)

## 2017-12-05 LAB — CBC
HEMATOCRIT: 40.2 % (ref 35.0–47.0)
HEMOGLOBIN: 13.9 g/dL (ref 12.0–16.0)
MCH: 30.3 pg (ref 26.0–34.0)
MCHC: 34.6 g/dL (ref 32.0–36.0)
MCV: 87.7 fL (ref 80.0–100.0)
Platelets: 260 10*3/uL (ref 150–440)
RBC: 4.59 MIL/uL (ref 3.80–5.20)
RDW: 14.6 % — AB (ref 11.5–14.5)
WBC: 6.1 10*3/uL (ref 3.6–11.0)

## 2017-12-05 LAB — URINALYSIS, ROUTINE W REFLEX MICROSCOPIC
Bilirubin Urine: NEGATIVE
Glucose, UA: NEGATIVE mg/dL
Hgb urine dipstick: NEGATIVE
KETONES UR: NEGATIVE mg/dL
LEUKOCYTES UA: NEGATIVE
NITRITE: NEGATIVE
PROTEIN: NEGATIVE mg/dL
Specific Gravity, Urine: 1.004 — ABNORMAL LOW (ref 1.005–1.030)
pH: 7 (ref 5.0–8.0)

## 2017-12-05 LAB — SEDIMENTATION RATE: Sed Rate: 22 mm/hr (ref 0–30)

## 2017-12-05 LAB — SURGICAL PCR SCREEN
MRSA, PCR: NEGATIVE
STAPHYLOCOCCUS AUREUS: NEGATIVE

## 2017-12-05 LAB — PROTIME-INR
INR: 0.94
Prothrombin Time: 12.5 seconds (ref 11.4–15.2)

## 2017-12-05 LAB — APTT: APTT: 28 s (ref 24–36)

## 2017-12-05 NOTE — Patient Instructions (Signed)
  Your procedure is scheduled on: Thursday December 07, 2017 Report to Same Day Surgery 2nd floor medical mall [facility] Entrance-take elevator on left to 2nd floor.  Check in with surgery information desk.) To find out your arrival time please call [phone] between 1PM - 3PM on Wednesday December 06, 2017  Remember: Instructions that are not followed completely may result in serious medical risk, up to and including death, or upon the discretion of your surgeon and anesthesiologist your surgery may need to be rescheduled.    _x___ 1. Do not eat food (including mints, candies, chewing gum) after midnight the night before your procedure. You may drink clear liquids up to 2 hours before you are scheduled to arrive at the hospital for your procedure.  Do not drink clear liquids within 2 hours of your scheduled arrival to the hospital.  Clear liquids include  --Water or Apple juice without pulp  --Clear carbohydrate beverage such as Gatorade  --Black Coffee or Clear Tea (No milk, no creamers, do not add anything to the coffee or tea)    __x__ 2. No Alcohol for 24 hours before or after surgery.   __x__ 3. No Smoking or e-cigarettes for 24 prior to surgery.  Do not use any chewable tobacco products for at least 6 hour prior to surgery   __x__ 4. Notify your doctor if there is any change in your medical condition (cold, fever, infections).   __x__ 5. On the morning of surgery brush your teeth with toothpaste and water.  You may rinse your mouth with mouth wash if you wish.  Do not swallow any toothpaste or mouthwash.  Please read over the following fact sheets that you were given:   [facility] Preparing for Surgery and or MRSA Information    __x__ Use CHG Soap or sage wipes as directed on instruction sheet    Do not wear jewelry, make-up, hairpins, clips or nail polish.  Do not wear lotions, powders, deodorant, or perfumes.   Do not shave below the face/neck 48 hours prior to surgery.    Do not bring valuables to the hospital.    [facility] is not responsible for any belongings or valuables.               Contacts, dentures or bridgework may not be worn into surgery.  Leave your suitcase in the car. After surgery it may be brought to your room.  For patients admitted to the hospital, discharge time is determined by your treatment team.   _x___ Take anti-hypertensive listed below, cardiac, seizure, asthma, anti-reflux and psychiatric medicines. These include:  1. Ranitidine (Zantac)  _x___ Follow recommendations from Cardiologist, Pulmonologist or PCP regarding stopping Aspirin, Coumadin, Plavix ,Eliquis, Effient, or Pradaxa, and Pletal.  _x___ Stop Anti-inflammatories such as Advil, Aleve, Ibuprofen, Motrin, Naproxen, Naprosyn, Goodies powders or aspirin products. OK to take Tylenol and Celebrex.   _x___ NOW: Stop supplements (Fish Oil, CQ10, Turmeric, Probiotic) until after surgery. You may continue Vitamin D, Vitamin B, and multivitamin.

## 2017-12-06 MED ORDER — CEFAZOLIN SODIUM-DEXTROSE 2-4 GM/100ML-% IV SOLN
2.0000 g | Freq: Once | INTRAVENOUS | Status: AC
  Administered 2017-12-07: 2 g via INTRAVENOUS

## 2017-12-07 ENCOUNTER — Inpatient Hospital Stay: Payer: Medicare Other

## 2017-12-07 ENCOUNTER — Inpatient Hospital Stay: Payer: Medicare Other | Admitting: Registered Nurse

## 2017-12-07 ENCOUNTER — Encounter
Admission: RE | Disposition: A | Discharge status: Home / Self Care | Payer: Self-pay | Source: Ambulatory Visit | Attending: Orthopedic Surgery

## 2017-12-07 ENCOUNTER — Other Ambulatory Visit: Payer: Self-pay

## 2017-12-07 ENCOUNTER — Inpatient Hospital Stay
Admission: RE | Admit: 2017-12-07 | Discharge: 2017-12-10 | DRG: 470 | Disposition: A | Discharge status: Home / Self Care | Payer: Medicare Other | Source: Ambulatory Visit | Attending: Orthopedic Surgery | Admitting: Orthopedic Surgery

## 2017-12-07 DIAGNOSIS — Z806 Family history of leukemia: Secondary | ICD-10-CM

## 2017-12-07 DIAGNOSIS — Z9842 Cataract extraction status, left eye: Secondary | ICD-10-CM | POA: Diagnosis not present

## 2017-12-07 DIAGNOSIS — Z87440 Personal history of urinary (tract) infections: Secondary | ICD-10-CM

## 2017-12-07 DIAGNOSIS — S72141K Displaced intertrochanteric fracture of right femur, subsequent encounter for closed fracture with nonunion: Secondary | ICD-10-CM | POA: Diagnosis not present

## 2017-12-07 DIAGNOSIS — Z419 Encounter for procedure for purposes other than remedying health state, unspecified: Secondary | ICD-10-CM

## 2017-12-07 DIAGNOSIS — M1611 Unilateral primary osteoarthritis, right hip: Secondary | ICD-10-CM | POA: Diagnosis not present

## 2017-12-07 DIAGNOSIS — S72101A Unspecified trochanteric fracture of right femur, initial encounter for closed fracture: Secondary | ICD-10-CM | POA: Diagnosis not present

## 2017-12-07 DIAGNOSIS — Z888 Allergy status to other drugs, medicaments and biological substances status: Secondary | ICD-10-CM | POA: Diagnosis not present

## 2017-12-07 DIAGNOSIS — Z9841 Cataract extraction status, right eye: Secondary | ICD-10-CM | POA: Diagnosis not present

## 2017-12-07 DIAGNOSIS — Z8249 Family history of ischemic heart disease and other diseases of the circulatory system: Secondary | ICD-10-CM | POA: Diagnosis not present

## 2017-12-07 DIAGNOSIS — Z801 Family history of malignant neoplasm of trachea, bronchus and lung: Secondary | ICD-10-CM

## 2017-12-07 DIAGNOSIS — R7303 Prediabetes: Secondary | ICD-10-CM | POA: Diagnosis present

## 2017-12-07 DIAGNOSIS — Z85828 Personal history of other malignant neoplasm of skin: Secondary | ICD-10-CM | POA: Diagnosis not present

## 2017-12-07 DIAGNOSIS — Z471 Aftercare following joint replacement surgery: Secondary | ICD-10-CM | POA: Diagnosis not present

## 2017-12-07 DIAGNOSIS — G8918 Other acute postprocedural pain: Secondary | ICD-10-CM

## 2017-12-07 DIAGNOSIS — Z90710 Acquired absence of both cervix and uterus: Secondary | ICD-10-CM | POA: Diagnosis not present

## 2017-12-07 DIAGNOSIS — G43909 Migraine, unspecified, not intractable, without status migrainosus: Secondary | ICD-10-CM | POA: Diagnosis present

## 2017-12-07 DIAGNOSIS — M8580 Other specified disorders of bone density and structure, unspecified site: Secondary | ICD-10-CM | POA: Diagnosis present

## 2017-12-07 DIAGNOSIS — K219 Gastro-esophageal reflux disease without esophagitis: Secondary | ICD-10-CM | POA: Diagnosis present

## 2017-12-07 DIAGNOSIS — Z96649 Presence of unspecified artificial hip joint: Secondary | ICD-10-CM

## 2017-12-07 DIAGNOSIS — M84459K Pathological fracture, hip, unspecified, subsequent encounter for fracture with nonunion: Secondary | ICD-10-CM | POA: Diagnosis present

## 2017-12-07 DIAGNOSIS — E785 Hyperlipidemia, unspecified: Secondary | ICD-10-CM | POA: Diagnosis present

## 2017-12-07 DIAGNOSIS — M169 Osteoarthritis of hip, unspecified: Secondary | ICD-10-CM | POA: Diagnosis not present

## 2017-12-07 DIAGNOSIS — S72141A Displaced intertrochanteric fracture of right femur, initial encounter for closed fracture: Secondary | ICD-10-CM | POA: Diagnosis not present

## 2017-12-07 DIAGNOSIS — Z96641 Presence of right artificial hip joint: Secondary | ICD-10-CM

## 2017-12-07 DIAGNOSIS — E559 Vitamin D deficiency, unspecified: Secondary | ICD-10-CM | POA: Diagnosis present

## 2017-12-07 DIAGNOSIS — M25551 Pain in right hip: Secondary | ICD-10-CM | POA: Diagnosis not present

## 2017-12-07 HISTORY — PX: CONVERSION TO TOTAL HIP: SHX5784

## 2017-12-07 HISTORY — DX: Presence of unspecified artificial hip joint: Z96.649

## 2017-12-07 LAB — URINE CULTURE: Culture: <10000 — AB

## 2017-12-07 SURGERY — CONVERSION, PREVIOUS HIP SURGERY, TO TOTAL HIP ARTHROPLASTY
Anesthesia: Spinal | Site: Hip | Laterality: Right

## 2017-12-07 MED ORDER — TRANEXAMIC ACID 1000 MG/10ML IV SOLN
INTRAVENOUS | Status: DC | PRN
  Administered 2017-12-07: 1000 mg via INTRAVENOUS

## 2017-12-07 MED ORDER — GENTAMICIN SULFATE 40 MG/ML IJ SOLN
INTRAMUSCULAR | Status: AC
  Filled 2017-12-07: qty 2

## 2017-12-07 MED ORDER — MAGNESIUM CITRATE PO SOLN
1.0000 | Freq: Once | ORAL | Status: DC | PRN
  Filled 2017-12-07: qty 296

## 2017-12-07 MED ORDER — SENNOSIDES-DOCUSATE SODIUM 8.6-50 MG PO TABS
1.0000 | ORAL_TABLET | Freq: Every evening | ORAL | Status: DC | PRN
  Administered 2017-12-09: 1 via ORAL
  Filled 2017-12-07: qty 1

## 2017-12-07 MED ORDER — MORPHINE SULFATE (PF) 2 MG/ML IV SOLN
0.5000 mg | INTRAVENOUS | Status: DC | PRN
  Administered 2017-12-07: 0.5 mg via INTRAVENOUS
  Filled 2017-12-07: qty 1

## 2017-12-07 MED ORDER — KETOROLAC TROMETHAMINE 15 MG/ML IJ SOLN
15.0000 mg | Freq: Once | INTRAMUSCULAR | Status: AC
  Administered 2017-12-07: 15 mg via INTRAVENOUS
  Filled 2017-12-07: qty 1

## 2017-12-07 MED ORDER — METHOCARBAMOL 1000 MG/10ML IJ SOLN
500.0000 mg | Freq: Four times a day (QID) | INTRAVENOUS | Status: DC | PRN
  Filled 2017-12-07: qty 5

## 2017-12-07 MED ORDER — MONTELUKAST SODIUM 10 MG PO TABS
10.0000 mg | ORAL_TABLET | Freq: Every day | ORAL | Status: DC
  Administered 2017-12-07 – 2017-12-09 (×3): 10 mg via ORAL
  Filled 2017-12-07 (×3): qty 1

## 2017-12-07 MED ORDER — ONDANSETRON HCL 4 MG PO TABS
4.0000 mg | ORAL_TABLET | Freq: Four times a day (QID) | ORAL | Status: DC | PRN
  Administered 2017-12-08 – 2017-12-10 (×2): 4 mg via ORAL
  Filled 2017-12-07 (×2): qty 1

## 2017-12-07 MED ORDER — PHENYLEPHRINE HCL 10 MG/ML IJ SOLN
INTRAMUSCULAR | Status: DC | PRN
  Administered 2017-12-07: 50 ug via INTRAVENOUS
  Administered 2017-12-07 (×2): 100 ug via INTRAVENOUS

## 2017-12-07 MED ORDER — EPHEDRINE SULFATE 50 MG/ML IJ SOLN
INTRAMUSCULAR | Status: AC
  Filled 2017-12-07: qty 1

## 2017-12-07 MED ORDER — VITAMIN B-12 1000 MCG PO TABS
1000.0000 ug | ORAL_TABLET | Freq: Every day | ORAL | Status: DC
  Administered 2017-12-09 – 2017-12-10 (×2): 1000 ug via ORAL
  Filled 2017-12-07 (×2): qty 1

## 2017-12-07 MED ORDER — FENTANYL CITRATE (PF) 100 MCG/2ML IJ SOLN
INTRAMUSCULAR | Status: AC
  Filled 2017-12-07: qty 2

## 2017-12-07 MED ORDER — SODIUM CHLORIDE 0.9 % IR SOLN
Status: DC | PRN
  Administered 2017-12-07: 1000 mL

## 2017-12-07 MED ORDER — PROPOFOL 500 MG/50ML IV EMUL
INTRAVENOUS | Status: DC | PRN
  Administered 2017-12-07: 75 ug/kg/min via INTRAVENOUS

## 2017-12-07 MED ORDER — HYDROCODONE-ACETAMINOPHEN 7.5-325 MG PO TABS: 1.0000 | ORAL_TABLET | ORAL | Status: DC | PRN

## 2017-12-07 MED ORDER — BUPIVACAINE HCL (PF) 0.5 % IJ SOLN
INTRAMUSCULAR | Status: DC | PRN
  Administered 2017-12-07: 3 mL

## 2017-12-07 MED ORDER — CEFAZOLIN SODIUM-DEXTROSE 1-4 GM/50ML-% IV SOLN
1.0000 g | Freq: Four times a day (QID) | INTRAVENOUS | Status: AC
  Administered 2017-12-07 – 2017-12-08 (×3): 1 g via INTRAVENOUS
  Filled 2017-12-07 (×4): qty 50

## 2017-12-07 MED ORDER — DIPHENHYDRAMINE HCL 12.5 MG/5ML PO ELIX: 12.5000 mg | ORAL_SOLUTION | ORAL | Status: DC | PRN

## 2017-12-07 MED ORDER — GLYCOPYRROLATE 0.2 MG/ML IJ SOLN
INTRAMUSCULAR | Status: DC | PRN
  Administered 2017-12-07: 0.2 mg via INTRAVENOUS

## 2017-12-07 MED ORDER — CEFAZOLIN SODIUM-DEXTROSE 2-4 GM/100ML-% IV SOLN
INTRAVENOUS | Status: AC
  Filled 2017-12-07: qty 100

## 2017-12-07 MED ORDER — EPINEPHRINE PF 1 MG/ML IJ SOLN
INTRAMUSCULAR | Status: AC
  Filled 2017-12-07: qty 1

## 2017-12-07 MED ORDER — TRAMADOL HCL 50 MG PO TABS
50.0000 mg | ORAL_TABLET | Freq: Four times a day (QID) | ORAL | Status: DC
  Administered 2017-12-08 – 2017-12-10 (×8): 50 mg via ORAL
  Filled 2017-12-07 (×8): qty 1

## 2017-12-07 MED ORDER — FENTANYL CITRATE (PF) 100 MCG/2ML IJ SOLN
INTRAMUSCULAR | Status: DC | PRN
  Administered 2017-12-07: 25 ug via INTRAVENOUS

## 2017-12-07 MED ORDER — TURMERIC 500 MG PO CAPS: 1.0000 | ORAL_CAPSULE | Freq: Every day | ORAL | Status: DC

## 2017-12-07 MED ORDER — ONDANSETRON HCL 4 MG/2ML IJ SOLN: 4.0000 mg | Freq: Once | INTRAMUSCULAR | Status: DC | PRN

## 2017-12-07 MED ORDER — MENTHOL 3 MG MT LOZG
1.0000 | LOZENGE | OROMUCOSAL | Status: DC | PRN
  Filled 2017-12-07: qty 9

## 2017-12-07 MED ORDER — TRANEXAMIC ACID 1000 MG/10ML IV SOLN
INTRAVENOUS | Status: AC
  Filled 2017-12-07: qty 10

## 2017-12-07 MED ORDER — ONDANSETRON HCL 4 MG/2ML IJ SOLN
4.0000 mg | Freq: Four times a day (QID) | INTRAMUSCULAR | Status: DC | PRN
  Administered 2017-12-07 – 2017-12-08 (×3): 4 mg via INTRAVENOUS
  Filled 2017-12-07 (×3): qty 2

## 2017-12-07 MED ORDER — ONDANSETRON HCL 4 MG/2ML IJ SOLN
INTRAMUSCULAR | Status: AC
  Filled 2017-12-07: qty 2

## 2017-12-07 MED ORDER — SERTRALINE HCL 25 MG PO TABS
37.5000 mg | ORAL_TABLET | ORAL | Status: DC
  Administered 2017-12-08: 37.5 mg via ORAL
  Filled 2017-12-07 (×2): qty 1.5

## 2017-12-07 MED ORDER — BISACODYL 5 MG PO TBEC: 5.0000 mg | DELAYED_RELEASE_TABLET | Freq: Every day | ORAL | Status: DC | PRN

## 2017-12-07 MED ORDER — SERTRALINE HCL 50 MG PO TABS
25.0000 mg | ORAL_TABLET | ORAL | Status: DC
  Administered 2017-12-07 – 2017-12-09 (×2): 25 mg via ORAL
  Filled 2017-12-07 (×2): qty 1

## 2017-12-07 MED ORDER — RISAQUAD PO CAPS
1.0000 | ORAL_CAPSULE | Freq: Every day | ORAL | Status: DC
  Administered 2017-12-08 – 2017-12-10 (×3): 1 via ORAL
  Filled 2017-12-07 (×4): qty 1

## 2017-12-07 MED ORDER — SEVOFLURANE IN SOLN
RESPIRATORY_TRACT | Status: AC
  Filled 2017-12-07: qty 250

## 2017-12-07 MED ORDER — PHENOL 1.4 % MT LIQD
1.0000 | OROMUCOSAL | Status: DC | PRN
  Filled 2017-12-07: qty 177

## 2017-12-07 MED ORDER — COQ10 100 MG PO CAPS: 1.0000 | ORAL_CAPSULE | Freq: Every day | ORAL | Status: DC

## 2017-12-07 MED ORDER — LIDOCAINE HCL (PF) 2 % IJ SOLN
INTRAMUSCULAR | Status: AC
  Filled 2017-12-07: qty 10

## 2017-12-07 MED ORDER — DEXMEDETOMIDINE HCL 200 MCG/2ML IV SOLN
INTRAVENOUS | Status: DC | PRN
  Administered 2017-12-07: 12 ug via INTRAVENOUS
  Administered 2017-12-07 (×3): 8 ug via INTRAVENOUS
  Administered 2017-12-07: 12 ug via INTRAVENOUS

## 2017-12-07 MED ORDER — PROPOFOL 10 MG/ML IV BOLUS
INTRAVENOUS | Status: AC
  Filled 2017-12-07: qty 20

## 2017-12-07 MED ORDER — ASPIRIN 81 MG PO CHEW
81.0000 mg | CHEWABLE_TABLET | Freq: Two times a day (BID) | ORAL | Status: DC
  Administered 2017-12-07 – 2017-12-10 (×6): 81 mg via ORAL
  Filled 2017-12-07 (×6): qty 1

## 2017-12-07 MED ORDER — SUCCINYLCHOLINE CHLORIDE 20 MG/ML IJ SOLN
INTRAMUSCULAR | Status: AC
  Filled 2017-12-07: qty 1

## 2017-12-07 MED ORDER — PROPOFOL 10 MG/ML IV BOLUS
INTRAVENOUS | Status: DC | PRN
  Administered 2017-12-07: 20 mg via INTRAVENOUS
  Administered 2017-12-07: 30 mg via INTRAVENOUS
  Administered 2017-12-07: 20 mg via INTRAVENOUS

## 2017-12-07 MED ORDER — HYDROCODONE-ACETAMINOPHEN 5-325 MG PO TABS
1.0000 | ORAL_TABLET | ORAL | Status: DC | PRN
  Administered 2017-12-07: 1 via ORAL
  Administered 2017-12-07: 2 via ORAL
  Administered 2017-12-08: 1 via ORAL
  Filled 2017-12-07 (×5): qty 1

## 2017-12-07 MED ORDER — ZOLPIDEM TARTRATE 5 MG PO TABS: 5.0000 mg | ORAL_TABLET | Freq: Every evening | ORAL | Status: DC | PRN

## 2017-12-07 MED ORDER — ACETAMINOPHEN 325 MG PO TABS
325.0000 mg | ORAL_TABLET | Freq: Four times a day (QID) | ORAL | Status: DC | PRN
  Administered 2017-12-08: 650 mg via ORAL
  Administered 2017-12-09: 325 mg via ORAL
  Filled 2017-12-07: qty 1
  Filled 2017-12-07: qty 2

## 2017-12-07 MED ORDER — METOCLOPRAMIDE HCL 5 MG/ML IJ SOLN
5.0000 mg | Freq: Three times a day (TID) | INTRAMUSCULAR | Status: DC | PRN
  Administered 2017-12-08: 10 mg via INTRAVENOUS
  Filled 2017-12-07: qty 2

## 2017-12-07 MED ORDER — ALUM & MAG HYDROXIDE-SIMETH 200-200-20 MG/5ML PO SUSP
30.0000 mL | ORAL | Status: DC | PRN
  Administered 2017-12-09 (×2): 30 mL via ORAL
  Filled 2017-12-07 (×2): qty 30

## 2017-12-07 MED ORDER — EPINEPHRINE PF 1 MG/ML IJ SOLN
INTRAMUSCULAR | Status: DC | PRN
  Administered 2017-12-07: .0001 mL via INTRATHECAL

## 2017-12-07 MED ORDER — BUPIVACAINE HCL (PF) 0.5 % IJ SOLN
INTRAMUSCULAR | Status: AC
  Filled 2017-12-07: qty 10

## 2017-12-07 MED ORDER — METHOCARBAMOL 500 MG PO TABS
500.0000 mg | ORAL_TABLET | Freq: Four times a day (QID) | ORAL | Status: DC | PRN
  Administered 2017-12-07: 500 mg via ORAL
  Filled 2017-12-07: qty 1

## 2017-12-07 MED ORDER — VITAMIN D 1000 UNITS PO TABS
2000.0000 [IU] | ORAL_TABLET | Freq: Every day | ORAL | Status: DC
  Administered 2017-12-08 – 2017-12-10 (×3): 2000 [IU] via ORAL
  Filled 2017-12-07 (×4): qty 2

## 2017-12-07 MED ORDER — DONEPEZIL HCL 5 MG PO TABS
5.0000 mg | ORAL_TABLET | Freq: Every day | ORAL | Status: DC
  Administered 2017-12-07 – 2017-12-09 (×3): 5 mg via ORAL
  Filled 2017-12-07 (×4): qty 1

## 2017-12-07 MED ORDER — DOCUSATE SODIUM 100 MG PO CAPS
100.0000 mg | ORAL_CAPSULE | Freq: Two times a day (BID) | ORAL | Status: DC
  Administered 2017-12-07 – 2017-12-10 (×6): 100 mg via ORAL
  Filled 2017-12-07 (×6): qty 1

## 2017-12-07 MED ORDER — PROPOFOL 500 MG/50ML IV EMUL
INTRAVENOUS | Status: AC
  Filled 2017-12-07: qty 50

## 2017-12-07 MED ORDER — LACTATED RINGERS IV SOLN
INTRAVENOUS | Status: DC
  Administered 2017-12-07 (×2): via INTRAVENOUS

## 2017-12-07 MED ORDER — DEXMEDETOMIDINE HCL IN NACL 80 MCG/20ML IV SOLN
INTRAVENOUS | Status: AC
  Filled 2017-12-07: qty 20

## 2017-12-07 MED ORDER — FENTANYL CITRATE (PF) 100 MCG/2ML IJ SOLN: 25.0000 ug | INTRAMUSCULAR | Status: DC | PRN

## 2017-12-07 MED ORDER — FAMOTIDINE 20 MG PO TABS
20.0000 mg | ORAL_TABLET | Freq: Every day | ORAL | Status: DC
  Administered 2017-12-08 – 2017-12-10 (×3): 20 mg via ORAL
  Filled 2017-12-07 (×3): qty 1

## 2017-12-07 MED ORDER — ACETAMINOPHEN 500 MG PO TABS
500.0000 mg | ORAL_TABLET | Freq: Four times a day (QID) | ORAL | Status: AC
  Administered 2017-12-07 – 2017-12-08 (×2): 500 mg via ORAL
  Filled 2017-12-07 (×2): qty 1

## 2017-12-07 MED ORDER — GENTAMICIN SULFATE 40 MG/ML IJ SOLN
INTRAMUSCULAR | Status: AC
  Filled 2017-12-07: qty 6

## 2017-12-07 MED ORDER — PHENYLEPHRINE HCL 10 MG/ML IJ SOLN
INTRAMUSCULAR | Status: AC
  Filled 2017-12-07: qty 1

## 2017-12-07 MED ORDER — HEPARIN SODIUM (PORCINE) 10000 UNIT/ML IJ SOLN
INTRAMUSCULAR | Status: AC
  Filled 2017-12-07: qty 1

## 2017-12-07 MED ORDER — LORATADINE 10 MG PO TABS
10.0000 mg | ORAL_TABLET | Freq: Every day | ORAL | Status: DC
  Administered 2017-12-08 – 2017-12-10 (×3): 10 mg via ORAL
  Filled 2017-12-07 (×3): qty 1

## 2017-12-07 MED ORDER — ONDANSETRON HCL 4 MG/2ML IJ SOLN
INTRAMUSCULAR | Status: DC | PRN
  Administered 2017-12-07: 4 mg via INTRAVENOUS

## 2017-12-07 MED ORDER — EPHEDRINE SULFATE 50 MG/ML IJ SOLN
INTRAMUSCULAR | Status: DC | PRN
  Administered 2017-12-07 (×3): 5 mg via INTRAVENOUS
  Administered 2017-12-07: 10 mg via INTRAVENOUS
  Administered 2017-12-07 (×2): 5 mg via INTRAVENOUS

## 2017-12-07 MED ORDER — METOCLOPRAMIDE HCL 10 MG PO TABS: 5.0000 mg | ORAL_TABLET | Freq: Three times a day (TID) | ORAL | Status: DC | PRN

## 2017-12-07 MED ORDER — SODIUM CHLORIDE 0.9 % IV SOLN
INTRAVENOUS | Status: DC
  Administered 2017-12-07 – 2017-12-08 (×2): via INTRAVENOUS

## 2017-12-07 MED ORDER — MIDAZOLAM HCL 2 MG/2ML IJ SOLN
INTRAMUSCULAR | Status: AC
  Filled 2017-12-07: qty 2

## 2017-12-07 SURGICAL SUPPLY — 59 items
BLADE SAGITTAL AGGR TOOTH XLG (BLADE) ×2 IMPLANT
BLADE SAGITTAL WIDE XTHICK NO (BLADE) IMPLANT
CANISTER SUCT 1200ML W/VALVE (MISCELLANEOUS) ×2 IMPLANT
CANISTER SUCT 3000ML PPV (MISCELLANEOUS) ×4 IMPLANT
CELL SAVER ADDITIONAL TIME PER (MISCELLANEOUS) ×50
CELL SAVER COLL SVCS (MISCELLANEOUS) ×2
CELL SAVER LIPIGUARD (MISCELLANEOUS) ×2
CELL SAVER SAMPLING PO (MISCELLANEOUS) ×2
CHLORAPREP W/TINT 26ML (MISCELLANEOUS) ×2 IMPLANT
DRAPE C-ARMOR (DRAPES) IMPLANT
DRAPE INCISE IOBAN 66X60 STRL (DRAPES) ×4 IMPLANT
DRAPE SHEET LG 3/4 BI-LAMINATE (DRAPES) ×4 IMPLANT
DRAPE TABLE BACK 80X90 (DRAPES) ×2 IMPLANT
ELECT BLADE 6.5 EXT (BLADE) ×2 IMPLANT
ELECT CAUTERY BLADE 6.4 (BLADE) ×2 IMPLANT
ELECT REM PT RETURN 9FT ADLT (ELECTROSURGICAL) ×2
ELECTRODE REM PT RTRN 9FT ADLT (ELECTROSURGICAL) ×1 IMPLANT
GLOVE BIOGEL PI IND STRL 9 (GLOVE) ×1 IMPLANT
GLOVE BIOGEL PI INDICATOR 9 (GLOVE) ×1
GLOVE INDICATOR 8.0 STRL GRN (GLOVE) ×2 IMPLANT
GLOVE SURG ORTHO 8.0 STRL STRW (GLOVE) ×2 IMPLANT
GLOVE SURG SYN 9.0  PF PI (GLOVE) ×2
GLOVE SURG SYN 9.0 PF PI (GLOVE) ×2 IMPLANT
GOWN STRL REUS W/ TWL LRG LVL3 (GOWN DISPOSABLE) ×1 IMPLANT
GOWN STRL REUS W/ TWL XL LVL3 (GOWN DISPOSABLE) ×1 IMPLANT
GOWN STRL REUS W/TWL LRG LVL3 (GOWN DISPOSABLE) ×1
GOWN STRL REUS W/TWL XL LVL3 (GOWN DISPOSABLE) ×1
HEMOSTAT SURGICEL 2X3 (HEMOSTASIS) ×4 IMPLANT
HIP FEM HD M 28 (Head) ×2 IMPLANT
HOLDER FOLEY CATH W/STRAP (MISCELLANEOUS) ×2 IMPLANT
HOOD PEEL AWAY FLYTE STAYCOOL (MISCELLANEOUS) ×4 IMPLANT
KIT PREVENA INCISION MGT 13 (CANNISTER) ×2 IMPLANT
KIT TURNOVER KIT A (KITS) ×2 IMPLANT
LINER DUAL MOB 50MM (Liner) ×2 IMPLANT
LIPIGURD CELL SAVER (MISCELLANEOUS) ×1 IMPLANT
NDL SAFETY ECLIPSE 18X1.5 (NEEDLE) ×1 IMPLANT
NEEDLE HYPO 18GX1.5 SHARP (NEEDLE) ×1
NS IRRIG 1000ML POUR BTL (IV SOLUTION) ×2 IMPLANT
PACK HIP PROSTHESIS (MISCELLANEOUS) ×2 IMPLANT
PULSAVAC PLUS IRRIG FAN TIP (DISPOSABLE) ×2
SAMPLING PO CELL SAVER (MISCELLANEOUS) ×1 IMPLANT
SAVER CELL COLL SVCS (MISCELLANEOUS) ×1 IMPLANT
SHELL ACETABULAR SZ0 50 DME (Shell) ×2 IMPLANT
SOL .9 NS 3000ML IRR  AL (IV SOLUTION) ×1
SOL .9 NS 3000ML IRR UROMATIC (IV SOLUTION) ×1 IMPLANT
STAPLER SKIN PROX 35W (STAPLE) ×2 IMPLANT
STEM FEMORAL SZ3  STD COLLARED (Stem) ×2 IMPLANT
SUT DVC 2 QUILL PDO  T11 36X36 (SUTURE) ×1
SUT DVC 2 QUILL PDO T11 36X36 (SUTURE) ×1 IMPLANT
SUT ETHIBOND #5 BRAIDED 30INL (SUTURE) IMPLANT
SUT V-LOC 90 ABS DVC 3-0 CL (SUTURE) ×4 IMPLANT
SUT VIC AB 1 CT1 36 (SUTURE) ×2 IMPLANT
SYR 20CC LL (SYRINGE) ×2 IMPLANT
TIME ADDITIONAL PER CELL SAVER (MISCELLANEOUS) ×25 IMPLANT
TIP FAN IRRIG PULSAVAC PLUS (DISPOSABLE) ×1 IMPLANT
TIP IRRIG/SUCT HIGH CAPACITY (MISCELLANEOUS) ×2 IMPLANT
TOWEL OR 17X26 4PK STRL BLUE (TOWEL DISPOSABLE) ×2 IMPLANT
TRAY FOLEY MTR SLVR 16FR STAT (SET/KITS/TRAYS/PACK) ×2 IMPLANT
WND VAC CANISTER 500ML (MISCELLANEOUS) ×2 IMPLANT

## 2017-12-07 NOTE — OR Nursing (Signed)
Explanted nail, lag screw, and set screw from right leg.

## 2017-12-07 NOTE — Transfer of Care (Signed)
Immediate Anesthesia Transfer of Care Note  Patient: Kristin Kramer  Procedure(s) Performed: CONVERSION TO TOTAL HIP ( REMOVING SMITH AND NEPHEW NAIL) (Right Hip)  Patient Location: PACU  Anesthesia Type:Spinal  Level of Consciousness: awake and alert   Airway & Oxygen Therapy: Patient Spontanous Breathing  Post-op Assessment: Report given to RN and Post -op Vital signs reviewed and stable  Post vital signs: Reviewed  Last Vitals:  Vitals Value Taken Time  BP 91/56 12/07/2017 10:32 AM  Temp    Pulse 54 12/07/2017 10:32 AM  Resp 16 12/07/2017 10:32 AM  SpO2 94 % 12/07/2017 10:32 AM  Vitals shown include unvalidated device data.  Last Pain:  Vitals:   12/07/17 0726  TempSrc: Tympanic  PainSc: 4          Complications: No apparent anesthesia complications

## 2017-12-07 NOTE — NC FL2 (Signed)
[place] LEVEL OF CARE SCREENING TOOL     IDENTIFICATION  Patient Name: Kristin Kramer Birthdate: November 27, 1940 Sex: female Admission Date (Current Location): 12/07/2017  [place] and [place] Number:  Engineering geologist and Address:  [facility], [address] [place], [place] [address]      Provider Number: [id number]  Attending Physician Name and Address:  Hessie Knows, MD  Relative Name and Phone Number:       Current Level of Care: Hospital Recommended Level of Care: [place] Prior Approval Number:    Date Approved/Denied:   PASRR Number: [id number])  Discharge Plan: SNF    Current Diagnoses: Patient Active Problem List   Diagnosis Date Noted  . Status post total hip replacement, right 12/07/2017  . Closed intertrochanteric fracture with nonunion, right 11/28/2017  . Facial laceration 11/24/2017  . Pain in right hip 10/06/2017  . Status post hip surgery 07/15/2017  . Closed right hip fracture ([place]) 07/07/2017  . Chronic nasal congestion 04/19/2017  . Exertional dyspnea 04/19/2017  . Memory deficit 08/05/2016  . 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 Advanced care planning/counseling discussion 07/31/2014  . Diarrhea 07/31/2014  . Right rib fracture 05/21/2014  . 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  . Post-operative nausea and vomiting   . Refusal of blood transfusions as patient is Jehovah's Witness   . Medicare annual wellness visit, subsequent 01/14/2013  . Transaminitis 01/03/2013  . 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 Osteopenia   . HLD (hyperlipidemia)   . Vitamin D deficiency   . Seasonal allergies   . Squamous cell skin cancer   . MDD (major depressive disorder), recurrent episode, moderate ([place])   . Migraines   . Abnormal EKG     Orientation RESPIRATION BLADDER Height & Weight     Self, Time, Situation, Place  Normal Continent Weight:  150 lb 6.4 oz (68.2 kg) Height:  5\' 4"  (162.6 cm)  BEHAVIORAL SYMPTOMS/MOOD NEUROLOGICAL BOWEL NUTRITION STATUS      Continent Diet(Diet: Regular )  AMBULATORY STATUS COMMUNICATION OF NEEDS Skin   Extensive Assist Verbally Surgical wounds, Wound Vac(Incision: Right Hip, Provena Wound Vac. )                       Personal Care Assistance Level of Assistance  Bathing, Feeding, Dressing Bathing Assistance: Limited assistance Feeding assistance: Independent Dressing Assistance: Limited assistance     Functional Limitations Info  Sight, Hearing, Speech Sight Info: Adequate Hearing Info: Adequate Speech Info: Adequate    SPECIAL CARE FACTORS FREQUENCY  PT (By licensed PT), OT (By licensed OT)     PT Frequency: (5) OT Frequency: (5)            Contractures      Additional Factors Info  Code Status, Allergies Code Status Info: (Full Code. ) Allergies Info: (Lipitor Atorvastatin, Pravastatin)           Current Medications (12/07/2017):  This is the current hospital active medication list Current Facility-Administered Medications  Medication Dose Route Frequency Provider Last Rate Last Dose  . 0.9 %  sodium chloride infusion   Intravenous Continuous Hessie Knows, MD 100 mL/hr at 12/07/17 1410    . [START ON 12/08/2017] acetaminophen (TYLENOL) tablet 325-650 mg  325-650 mg Oral Q6H PRN Hessie Knows, MD      . acetaminophen (TYLENOL) tablet 500 mg  500 mg Oral Q6H Hessie Knows, MD  500 mg at 12/07/17 1219  . acidophilus (RISAQUAD) capsule 1 capsule  1 capsule Oral Daily Hessie Knows, MD      . alum & mag hydroxide-simeth (MAALOX/MYLANTA) 200-200-20 MG/5ML suspension 30 mL  30 mL Oral Q4H PRN Hessie Knows, MD      . aspirin chewable tablet 81 mg  81 mg Oral BID Hessie Knows, MD      . bisacodyl (DULCOLAX) EC tablet 5 mg  5 mg Oral Daily PRN Hessie Knows, MD      . ceFAZolin (ANCEF) IVPB 1 g/50 mL premix  1 g Intravenous Q6H Hessie Knows, MD 100 mL/hr at 12/07/17  1413 1 g at 12/07/17 1413  . cholecalciferol (VITAMIN D) tablet 2,000 Units  2,000 Units Oral Daily Hessie Knows, MD      . diphenhydrAMINE (BENADRYL) 12.5 MG/5ML elixir 12.5-25 mg  12.5-25 mg Oral Q4H PRN Hessie Knows, MD      . docusate sodium (COLACE) capsule 100 mg  100 mg Oral BID Hessie Knows, MD      . donepezil (ARICEPT) tablet 5 mg  5 mg Oral QHS Hessie Knows, MD      . Derrill Memo ON 12/08/2017] famotidine (PEPCID) tablet 20 mg  20 mg Oral Daily Hessie Knows, MD      . HYDROcodone-acetaminophen [facility]) 7.5-325 MG per tablet 1-2 tablet  1-2 tablet Oral Q4H PRN Hessie Knows, MD      . HYDROcodone-acetaminophen (NORCO/VICODIN) 5-325 MG per tablet 1-2 tablet  1-2 tablet Oral Q4H PRN Hessie Knows, MD   1 tablet at 12/07/17 1219  . loratadine (CLARITIN) tablet 10 mg  10 mg Oral Daily Hessie Knows, MD      . magnesium citrate solution 1 Bottle  1 Bottle Oral Once PRN Hessie Knows, MD      . menthol-cetylpyridinium (CEPACOL) lozenge 3 mg  1 lozenge Oral PRN Hessie Knows, MD       Or  . phenol (CHLORASEPTIC) mouth spray 1 spray  1 spray Mouth/Throat PRN Hessie Knows, MD      . methocarbamol (ROBAXIN) tablet 500 mg  500 mg Oral Q6H PRN Hessie Knows, MD       Or  . methocarbamol (ROBAXIN) 500 mg in dextrose 5 % 50 mL IVPB  500 mg Intravenous Q6H PRN Hessie Knows, MD      . metoCLOPramide (REGLAN) tablet 5-10 mg  5-10 mg Oral Q8H PRN Hessie Knows, MD       Or  . metoCLOPramide (REGLAN) injection 5-10 mg  5-10 mg Intravenous Q8H PRN Hessie Knows, MD      . montelukast (SINGULAIR) tablet 10 mg  10 mg Oral QHS Hessie Knows, MD      . morphine 2 MG/ML injection 0.5-1 mg  0.5-1 mg Intravenous Q2H PRN Hessie Knows, MD   0.5 mg at 12/07/17 1410  . ondansetron (ZOFRAN) tablet 4 mg  4 mg Oral Q6H PRN Hessie Knows, MD       Or  . ondansetron [facility]) injection 4 mg  4 mg Intravenous Q6H PRN Hessie Knows, MD      . senna-docusate (Senokot-S) tablet 1 tablet  1 tablet Oral QHS PRN Hessie Knows, MD      . sertraline (ZOLOFT) tablet 25 mg  25 mg Oral Ceasar Lund, MD      . Derrill Memo ON 12/08/2017] sertraline (ZOLOFT) tablet 37.5 mg  37.5 mg Oral Ceasar Lund, MD      . traMADol Veatrice Bourbon) tablet 50 mg  50 mg  Oral Q6H Hessie Knows, MD      . vitamin B-12 (CYANOCOBALAMIN) tablet 1,000 mcg  1,000 mcg Oral Daily Hessie Knows, MD      . zolpidem [facility]) tablet 5 mg  5 mg Oral QHS PRN Hessie Knows, MD         Discharge Medications: Please see discharge summary for a list of discharge medications.  Relevant Imaging Results:  Relevant Lab Results:   Additional Information (SSN: [id number])  Temica Righetti, Veronia Beets, LCSW

## 2017-12-07 NOTE — Anesthesia Post-op Follow-up Note (Signed)
Anesthesia QCDR form completed.        

## 2017-12-07 NOTE — Anesthesia Procedure Notes (Signed)
Spinal  Patient location during procedure: OR Start time: 12/07/2017 8:38 AM End time: 12/07/2017 8:45 AM Staffing Anesthesiologist: Martha Clan, MD Resident/CRNA: Rolla Plate, CRNA Other anesthesia staff: Lia Foyer, RN Performed: other anesthesia staff  Preanesthetic Checklist Completed: patient identified, site marked, surgical consent, pre-op evaluation, timeout performed, IV checked, risks and benefits discussed and monitors and equipment checked Spinal Block Patient position: sitting Prep: ChloraPrep and site prepped and draped Patient monitoring: heart rate, continuous pulse ox, blood pressure and cardiac monitor Approach: midline Location: L4-5 Injection technique: single-shot Needle Needle type: Introducer and Pencan  Needle gauge: 24 G Needle length: 9 cm Additional Notes Negative paresthesia. Negative blood return. Positive free-flowing CSF. Expiration date of kit checked and confirmed. Patient tolerated procedure well, without complications.

## 2017-12-07 NOTE — Evaluation (Signed)
Physical Therapy Evaluation Patient Details Name: Kristin Kramer MRN: [id number] DOB: 1940/09/06 Today's Date: 12/07/2017   History of Present Illness  Pt is a 77 y.o. 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  PMH includes B TKR.  Clinical Impression  Prior to hospital admission, pt was ambulatory (using SPC vs RW depending on pain and activity).  Pt lives alone in 1 level condo (no steps).  Currently pt is min assist semi-supine to/from sit.  Pt reporting 10/10 R hip pain beginning of session but agreeable to trying to sit on edge of bed (min assist semi-supine to/from sit).  Deferred further mobility d/t pt's high level of R hip/thigh/groin pain in general (pt reports pain feels like it is where the hardware was removed).  Pain 8/10 end of session resting in bed (nursing notified).  Educated pt on posterior hip precautions (needs review).  Pt would benefit from skilled PT to address noted impairments and functional limitations (see below for any additional details).  Upon hospital discharge, recommend pt discharge to [facility].    Follow Up Recommendations SNF    Equipment Recommendations  Rolling walker with 5" wheels;3in1 (PT)    Recommendations for Other Services OT consult     Precautions / Restrictions Precautions Precautions: Fall;Posterior Hip Precaution Booklet Issued: Yes (comment) Precaution Comments: Wound vac Restrictions Weight Bearing Restrictions: Yes RLE Weight Bearing: Weight bearing as tolerated      Mobility  Bed Mobility Overal bed mobility: Needs Assistance Bed Mobility: Supine to Sit;Sit to Supine     Supine to sit: Min assist;HOB elevated Sit to supine: Min assist;HOB elevated   General bed mobility comments: assist for R LE; vc's for technique and bedrail use  Transfers                 General transfer comment: Deferred d/t pt nauseas and c/o  significant R hip pain sitting edge of bed  Ambulation/Gait             General Gait Details: Deferred d/t pt nauseas and c/o significant R hip pain sitting edge of bed  Stairs            Wheelchair Mobility    Modified Rankin (Stroke Patients Only)       Balance Overall balance assessment: Needs assistance Sitting-balance support: Bilateral upper extremity supported;Feet supported Sitting balance-Leahy Scale: Poor Sitting balance - Comments: pt requiring B UE support d/t R hip pain Postural control: Left lateral lean(d/t R hip pain)                                   Pertinent Vitals/Pain Pain Assessment: 0-10 Pain Score: 8  Pain Location: R hip/thigh/groin Pain Descriptors / Indicators: Aching;Grimacing;Guarding;Tender(Tearful at times) Pain Intervention(s): Limited activity within patient's tolerance;Monitored during session;Premedicated before session;Repositioned;Other (comment)(RN notified of pt's pain and bringing pt more pain meds)  Vitals (HR and O2 on room air) stable and WFL throughout treatment session.    Home Living Family/patient expects to be discharged to:: Private residence Living Arrangements: Alone   Type of Home: Other(Comment)(condo) Home Access: Level entry     Home Layout: One level Home Equipment: [place] - 2 wheels;Cane - single point;Bedside commode;Shower seat      Prior Function Level of Independence: Independent with assistive device(s)         Comments:  Recently using RW or SPC depending on activity and pain.     Hand Dominance        Extremity/Trunk Assessment   Upper Extremity Assessment Upper Extremity Assessment: Overall WFL for tasks assessed    Lower Extremity Assessment Lower Extremity Assessment: RLE deficits/detail;LLE deficits/detail RLE Deficits / Details: at least 3/5 AROM DF RLE: Unable to fully assess due to pain LLE Deficits / Details: AROM WFL    Cervical / Trunk  Assessment Cervical / Trunk Assessment: Normal  Communication   Communication: No difficulties  Cognition Arousal/Alertness: Awake/alert Behavior During Therapy: WFL for tasks assessed/performed Overall Cognitive Status: Within Functional Limits for tasks assessed                                        General Comments General comments (skin integrity, edema, etc.): R hip wound vac dressing in place.  Nursing cleared pt for participation in physical therapy.  Pt agreeable to limited PT session.    Exercises Total Joint Exercises Ankle Circles/Pumps: AROM;Strengthening;Both;10 reps;Supine Heel Slides: AAROM;Strengthening;Right;10 reps;Supine Hip ABduction/ADduction: AAROM;Strengthening;Right;5 reps;Supine(limited d/t R hip pain)   Assessment/Plan    PT Assessment Patient needs continued PT services  PT Problem List Decreased strength;Decreased range of motion;Decreased activity tolerance;Decreased balance;Decreased mobility;Decreased knowledge of use of DME;Decreased knowledge of precautions;Pain;Decreased skin integrity       PT Treatment Interventions DME instruction;Gait training;Functional mobility training;Therapeutic activities;Therapeutic exercise;Balance training;Patient/family education    PT Goals (Current goals can be found in the Care Plan section)  Acute Rehab PT Goals Patient Stated Goal: to have less pain and be able to walk again PT Goal Formulation: With patient Time For Goal Achievement: 12/21/17 Potential to Achieve Goals: Good    Frequency BID   Barriers to discharge Decreased caregiver support      Co-evaluation               AM-PAC PT "6 Clicks" Daily Activity  Outcome Measure Difficulty turning over in bed (including adjusting bedclothes, sheets and blankets)?: Unable Difficulty moving from lying on back to sitting on the side of the bed? : Unable Difficulty sitting down on and standing up from a chair with arms (e.g.,  wheelchair, bedside commode, etc,.)?: Unable Help needed moving to and from a bed to chair (including a wheelchair)?: A Lot Help needed walking in hospital room?: Total Help needed climbing 3-5 steps with a railing? : Total 6 Click Score: 7    End of Session Equipment Utilized During Treatment: Gait belt Activity Tolerance: Patient limited by pain Patient left: in bed;with call bell/phone within reach;with bed alarm set;with family/visitor present;with SCD's reapplied;Other (comment)(B heels elevated via pillow) Nurse Communication: Mobility status;Precautions;Weight bearing status;Other (comment)(Pt's pain status) PT Visit Diagnosis: Other abnormalities of gait and mobility (R26.89);Muscle weakness (generalized) (M62.81);History of falling (Z91.81);Difficulty in walking, not elsewhere classified (R26.2);Pain Pain - Right/Left: Right Pain - part of body: Hip    Time: 3846-6599 PT Time Calculation (min) (ACUTE ONLY): 30 min   Charges:   PT Evaluation $PT Eval Low Complexity: 1 Low PT Treatments $Therapeutic Exercise: 8-22 mins       Leitha Bleak, PT 12/07/17, 4:28 PM [phone]

## 2017-12-07 NOTE — Op Note (Signed)
12/07/2017  10:33 AM  PATIENT:  Kristin Kramer  77 y.o. 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  POST-OPERATIVE DIAGNOSIS: Same  PROCEDURE:  Procedure(s): CONVERSION TO TOTAL HIP ( REMOVING SMITH AND NEPHEW NAIL) (Right) version of prior hip surgery to total hip  SURGEON: Laurene Footman, MD  ASSISTANTS: Rachelle Hora, PA-C  ANESTHESIA:   spinal  EBL:  Total I/O In: 1350 [I.V.:1100; Blood:250] Out: 387 [Urine:200; Blood:475]  BLOOD ADMINISTERED:none  DRAINS: none   LOCAL MEDICATIONS USED:  NONE  SPECIMEN:  Source of Specimen:  Right femoral head  DISPOSITION OF SPECIMEN:  PATHOLOGY  COUNTS:  YES  TOURNIQUET:  * No tourniquets in log *  IMPLANTS: Medacta AMIS 3 standard stem with 50 mm Mpact DM cup and liner, M metal 28 mm head  DICTATION: .Dragon Dictation patient was brought to the operating room and after adequate spinal anesthesia was obtained she is placed in the lateral decubitus position on a pegboard.  C-arm was brought in an initial template x-ray taken.  The hip was then prepped and draped you sterile fashion with appropriate patient identification and timeout procedure.  A posterior approach was made with the incision incorporating portions of her old incisions from the rod insertion.  The tip of the rod and the 2 screws through the lateral cortex into the head were identified and removed without much difficulty.  After rod removal posterior the posterior approach was continued with the initially the splitting the IT band and gluteal muscle with internal rotation the short external rotators were detached along the capsule and the femoral head dislocated with femoral neck cut carried out.  The head measured approximately 44 mm and the posterior labrum was excised with acetabulum reamed to a size 48 with a 50 mm cup impacted into position with acceptable position obtained.  The proximal canal was then approached with  rasp and broaches up to a size 3 which had a stuck tight fit with good rotational stability trials were placed and after appropriate trials with the comparison x-ray to the initial template final implants were chosen.  The 3 stem was impacted with appropriate anteversion followed by the dual mobility head with the M 28 mm head which was then assembled on the back table impacted and then the hip was reduced it was stable through range of motion including 90 degrees flexion and 45 with 45 degrees internal rotation the hip was then thoroughly irrigated and the gluteal fascia repaired using a heavy Quill suture with 2-0 Vicryl subcutaneously skin staples and incisional wound VAC  PLAN OF CARE: Admit to inpatient   PATIENT DISPOSITION:  PACU - hemodynamically stable.

## 2017-12-07 NOTE — H&P (Signed)
Reviewed paper H+P, will be scanned into chart. No changes noted.  

## 2017-12-07 NOTE — Anesthesia Preprocedure Evaluation (Signed)
Anesthesia Evaluation  Patient identified by MRN, date of birth, ID band Patient awake    Reviewed: Allergy & Precautions, H&P , NPO status , Patient's Chart, lab work & pertinent test results  History of Anesthesia Complications (+) PONV and history of anesthetic complications  Airway Mallampati: III       Dental  (+) Teeth Intact, Caps   Pulmonary neg pulmonary ROS,           Cardiovascular Exercise Tolerance: Good negative cardio ROS       Neuro/Psych PSYCHIATRIC DISORDERS (Depression) Anxiety Depression negative neurological ROS     GI/Hepatic Neg liver ROS, GERD  Medicated and Controlled,  Endo/Other  negative endocrine ROS  Renal/GU negative Renal ROS  negative genitourinary   Musculoskeletal   Abdominal   Peds  Hematology  (+) REFUSES BLOOD PRODUCTS, JEHOVAH'S WITNESS  Anesthesia Other Findings Past Medical History: 10.2014: Abdominal pain, other specified site     Comment: [facility] - mesenteric adenitis vs sclerosing               mesenteritis vs nonspecific lymphadenitis No date: Abnormal ECG     Comment: a. left axis deviation;  b. 12/2011 Echo: EF               55-60%, no rwma, pasp 40mmHg. No date: Arthritis No date: Depression     Comment: prior on lexapro, celexa, wellbutrin.  high               cymbalta doses cause tremors No date: H/O seasonal allergies No date: History of pneumonia No date: History of UTI     Comment: 12/2011 - Treatment began preoperatively with               CIPRO 500mg  BID No date: HLD (hyperlipidemia)     Comment: statin intolerance 2013, 2014: Knee osteoarthritis     Comment: bilateral s/p B TKR Noemi [facility]) No date: Knee osteoarthritis No date: Migraines No date: Osteopenia No date: Post-operative nausea and vomiting No date: Refusal of blood transfusions as patient is Je* No date: Seasonal allergies     Comment: cats, dust, mold, roaches 07/2013: Squamous cell  skin cancer     Comment: R anterior leg 2015 multiple, 2016, 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 Isac Sarna), R tibia               - then recurrent No date: Vitamin D deficiency   Reproductive/Obstetrics negative OB ROS                             Anesthesia Physical  Anesthesia Plan  ASA: II  Anesthesia Plan: Spinal   Post-op Pain Management:    Induction: Intravenous  PONV Risk Score and Plan:   Airway Management Planned: Simple Face Mask and Natural Airway  Additional Equipment:   Intra-op Plan:   Post-operative Plan:   Informed Consent: I have reviewed the patients History and Physical, chart, labs and discussed the procedure including the risks, benefits and alternatives for the proposed anesthesia with the patient or authorized representative who has indicated his/her understanding and acceptance.   Dental advisory given  Plan Discussed with: CRNA and Anesthesiologist  Anesthesia Plan Comments:         Anesthesia Quick Evaluation

## 2017-12-08 ENCOUNTER — Encounter
Admission: RE | Admit: 2017-12-08 | Discharge: 2017-12-08 | Disposition: A | Discharge status: Home / Self Care | Payer: Medicare Other | Source: Ambulatory Visit | Attending: Internal Medicine | Admitting: Internal Medicine

## 2017-12-08 LAB — BASIC METABOLIC PANEL
ANION GAP: 4 — AB (ref 5–15)
BUN: 12 mg/dL (ref 8–23)
CALCIUM: 8.4 mg/dL — AB (ref 8.9–10.3)
CO2: 30 mmol/L (ref 22–32)
Chloride: 105 mmol/L (ref 98–111)
Creatinine, Ser: 0.63 mg/dL (ref 0.44–1.00)
GFR calc non Af Amer: >60 mL/min (ref >60)
Glucose, Bld: 144 mg/dL — ABNORMAL HIGH (ref 70–99)
Potassium: 4.1 mmol/L (ref 3.5–5.1)
Sodium: 139 mmol/L (ref 135–145)

## 2017-12-08 LAB — CBC
HCT: 33 % — ABNORMAL LOW (ref 35.0–47.0)
HEMOGLOBIN: 11.3 g/dL — AB (ref 12.0–16.0)
MCH: 30.4 pg (ref 26.0–34.0)
MCHC: 34.3 g/dL (ref 32.0–36.0)
MCV: 88.7 fL (ref 80.0–100.0)
Platelets: 194 10*3/uL (ref 150–440)
RBC: 3.72 MIL/uL — AB (ref 3.80–5.20)
RDW: 14.3 % (ref 11.5–14.5)
WBC: 8.3 10*3/uL (ref 3.6–11.0)

## 2017-12-08 MED ORDER — SODIUM CHLORIDE 0.9 % IV BOLUS
500.0000 mL | Freq: Once | INTRAVENOUS | Status: AC
  Administered 2017-12-08: 500 mL via INTRAVENOUS

## 2017-12-08 NOTE — Plan of Care (Signed)

## 2017-12-08 NOTE — Progress Notes (Signed)
Physical Therapy Treatment Patient Details Name: Kristin Kramer MRN: [id number] DOB: [birth year] Today's Date: 12/08/2017    History of Present Illness Pt is a 77 y.o. 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  PMH includes B TKR.    PT Comments    Pt's R hip/thigh/groin pain 4/10 beginning of session, increased with activity (pt did not rate but verbalizing more pain), and 3/10 end of session.  Able to progress to transfers and walking 5 feet with RW with min assist (limited d/t above noted pain).  Vc's required for posterior hip precautions with all activities.  Pt appearing very motivated to participate with therapy.  Continue to recommend STR.    Follow Up Recommendations  SNF     Equipment Recommendations  Rolling walker with 5" wheels;3in1 (PT)    Recommendations for Other Services OT consult     Precautions / Restrictions Precautions Precautions: Fall;Posterior Hip Precaution Booklet Issued: Yes (comment) Precaution Comments: Wound vac Restrictions Weight Bearing Restrictions: Yes RLE Weight Bearing: Weight bearing as tolerated    Mobility  Bed Mobility Overal bed mobility: Needs Assistance Bed Mobility: Supine to Sit     Supine to sit: Min assist;HOB elevated     General bed mobility comments: assist for R LE; vc's for technique, posterior hip precautions, and bedrail use  Transfers Overall transfer level: Needs assistance Equipment used: Rolling walker (2 wheeled) Transfers: Sit to/from [facility] Sit to Stand: Min assist Stand pivot transfers: Min assist       General transfer comment: vc's for posterior hip precautions and transfer technique; assist to initiate stand from bed and BSC; stand step turn bed to [facility]  Ambulation/Gait Ambulation/Gait assistance: Min assist Gait Distance (Feet): 5 Feet(BSC to recliner) Assistive device:  Rolling walker (2 wheeled)   Gait velocity: decreased   General Gait Details: minimal WB'ing through R LE d/t pain; vc's for increased UE use through RW to offweight R LE d/t pain; vc's for posterior hip precautions with turning   Stairs             Wheelchair Mobility    Modified Rankin (Stroke Patients Only)       Balance Overall balance assessment: Needs assistance Sitting-balance support: Single extremity supported;Feet supported Sitting balance-Leahy Scale: Poor Sitting balance - Comments: pt requiring single UE support d/t R hip pain Postural control: Left lateral lean(d/t R hip pain) Standing balance support: Bilateral upper extremity supported Standing balance-Leahy Scale: Poor Standing balance comment: pt requiring B UE support for static standing balance                            Cognition Arousal/Alertness: Awake/alert Behavior During Therapy: WFL for tasks assessed/performed Overall Cognitive Status: Within Functional Limits for tasks assessed                                        Exercises Total Joint Exercises Ankle Circles/Pumps: AROM;Strengthening;Both;10 reps;Supine Quad Sets: AROM;Strengthening;Both;10 reps;Supine Gluteal Sets: AROM;Strengthening;Both;10 reps;Supine Towel Squeeze: AROM;Strengthening;Both;10 reps;Supine(pillow between pt's knees) Short Arc Quad: AAROM;Strengthening;Right;10 reps;Supine Heel Slides: AAROM;Strengthening;Right;10 reps;Supine Hip ABduction/ADduction: AAROM;Strengthening;Right;10 reps;Supine    General Comments General comments (skin integrity, edema, etc.): R hip wound vac dressing intact.  Nursing cleared pt for participation in physical therapy.  Pt agreeable  to PT session.      Pertinent Vitals/Pain Pain Assessment: 0-10 Pain Score: 3  Pain Descriptors / Indicators: Sore;Guarding Pain Intervention(s): Limited activity within patient's tolerance;Monitored during session;Premedicated  before session;Repositioned;Ice applied    Home Living                      Prior Function            PT Goals (current goals can now be found in the care plan section) Acute Rehab PT Goals Patient Stated Goal: to have less pain and be able to walk again PT Goal Formulation: With patient Time For Goal Achievement: 12/21/17 Potential to Achieve Goals: Good Progress towards PT goals: Progressing toward goals    Frequency    BID      PT Plan Current plan remains appropriate    Co-evaluation              AM-PAC PT "6 Clicks" Daily Activity  Outcome Measure  Difficulty turning over in bed (including adjusting bedclothes, sheets and blankets)?: Unable Difficulty moving from lying on back to sitting on the side of the bed? : Unable Difficulty sitting down on and standing up from a chair with arms (e.g., wheelchair, bedside commode, etc,.)?: Unable Help needed moving to and from a bed to chair (including a wheelchair)?: A Little Help needed walking in hospital room?: A Lot Help needed climbing 3-5 steps with a railing? : Total 6 Click Score: 9    End of Session Equipment Utilized During Treatment: Gait belt Activity Tolerance: Patient limited by pain Patient left: in chair;with call bell/phone within reach;with chair alarm set;with SCD's reapplied;Other (comment)(pillow placed between pt's knees for posterior hip precautions and B heels elevated via pillows) Nurse Communication: Mobility status;Precautions;Weight bearing status;Other (comment)(pt's pain status) PT Visit Diagnosis: Other abnormalities of gait and mobility (R26.89);Muscle weakness (generalized) (M62.81);History of falling (Z91.81);Difficulty in walking, not elsewhere classified (R26.2);Pain Pain - Right/Left: Right Pain - part of body: Hip     Time: 0100-7121 PT Time Calculation (min) (ACUTE ONLY): 41 min  Charges:  $Gait Training: 8-22 mins $Therapeutic Exercise: 8-22 mins $Therapeutic  Activity: 8-22 mins                     Leitha Bleak, PT 12/08/17, 9:54 AM [phone]

## 2017-12-08 NOTE — Evaluation (Signed)
Occupational Therapy Evaluation Patient Details Name: Kristin Kramer MRN: [id number] DOB: Feb 18, 1941 Today's Date: 12/08/2017    History of Present Illness Pt is a 77 y.o. 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  PMH includes B TKR.   Clinical Impression   Pt. Presents with posterior hip precautions, weakness, limited activity tolerance, and limited functional mobility which limits the ability to complete basic ADL and IADL functioning. Pt. Resides at home alone. Pt. Was independent with ADLs, and IADL functioning: including meal preparation, medication management, and driving. Pt. education was provided verbally, and through visual demonstration about A/E use for LE ADLs, and posterior hip precautions. Pt. Declined opportunity to practice with the A/E during this session. Pt. Could benefit from OT services for ADL training, A/E training, and pt. education about posterior hip precautions, LE dressing, home modification, and DME. Pt. would benefitt from SNF level of care upon discharge. Pt. Could benefit from follow-up OT services at discharge.    Follow Up Recommendations  SNF    Equipment Recommendations       Recommendations for Other Services       Precautions / Restrictions Precautions Precautions: Fall;Posterior Hip Precaution Booklet Issued: Yes (comment) Precaution Comments: Wound vac Restrictions Weight Bearing Restrictions: Yes RLE Weight Bearing: Weight bearing as tolerated      Mobility Bed Mobility Overal bed mobility: Needs Assistance Bed Mobility: Supine to Sit     Supine to sit: Min assist;HOB elevated     General bed mobility comments: assist for R LE; vc's for technique, posterior hip precautions, and bedrail use  Transfers Overall transfer level: Needs assistance Equipment used: Rolling walker (2 wheeled) Transfers: Sit to/from [employer] Sit to Stand: Min assist Stand pivot transfers: Min assist       General transfer comment: vc's for posterior hip precautions and transfer technique; assist to initiate stand from bed and BSC; stand step turn bed to [facility]    Balance                     ADL either performed or assessed with clinical judgement   ADL Overall ADL's : Needs assistance/impaired Eating/Feeding: Independent;Set up   Grooming: Independent;Set up           Upper Body Dressing : Independent   Lower Body Dressing: Maximal assistance                 General ADL Comments: Pt. education was provided about A/E use for LE ADLs, and posterior hip precautions.     Vision         Perception     Praxis      Pertinent Vitals/Pain Pain Assessment: 0-10 Pain Score: 5  Pain Location: Right hip Pain Descriptors / Indicators: Sore;Guarding Pain Intervention(s): Limited activity within patient's tolerance;Monitored during session;Premedicated before session     Hand Dominance     Extremity/Trunk Assessment Upper Extremity Assessment Upper Extremity Assessment: Overall WFL for tasks assessed           Communication Communication Communication: No difficulties   Cognition Arousal/Alertness: Awake/alert Behavior During Therapy: WFL for tasks assessed/performed Overall Cognitive Status: Within Functional Limits for tasks assessed                                     General Comments  R hip wound vac dressing intact    Exercises   Shoulder Instructions      Home Living Family/patient expects to be discharged to:: Private residence Living Arrangements: Alone   Type of Home: (Condo) Home Access: Level entry     Home Layout: One level     Bathroom Shower/Tub: Occupational psychologist: Handicapped height     Home Equipment: Environmental consultant - 2 wheels;Cane - single point;Bedside commode;Shower seat          Prior Functioning/Environment Level  of Independence: Independent with assistive device(s)        Comments: Recently using RW or SPC depending on activity and pain.        OT Problem List:        OT Treatment/Interventions: Self-care/ADL training;Therapeutic exercise;DME and/or AE instruction;Therapeutic activities    OT Goals(Current goals can be found in the care plan section) Acute Rehab OT Goals Patient Stated Goal: To regain independence OT Goal Formulation: With patient Potential to Achieve Goals: Good  OT Frequency: Min 2X/week   Barriers to D/C:            Co-evaluation              AM-PAC PT "6 Clicks" Daily Activity     Outcome Measure Help from another person eating meals?: None Help from another person taking care of personal grooming?: None Help from another person toileting, which includes using toliet, bedpan, or urinal?: A Lot Help from another person bathing (including washing, rinsing, drying)?: A Lot Help from another person to put on and taking off regular upper body clothing?: None Help from another person to put on and taking off regular lower body clothing?: A Lot 6 Click Score: 18   End of Session Equipment Utilized During Treatment: Gait belt  Activity Tolerance: Patient tolerated treatment well Patient left: in chair;with call bell/phone within reach;with chair alarm set  OT Visit Diagnosis: Muscle weakness (generalized) (M62.81)                Time: 1135-1150 OT Time Calculation (min): 15 min Charges:  OT General Charges $OT Visit: 1 Visit OT Evaluation $OT Eval Low Complexity: 1 Low  Harrel Carina, MS, OTR/L   Harrel Carina 12/08/2017, 12:51 PM

## 2017-12-08 NOTE — Progress Notes (Signed)
Patient's BP is now 96/48 after bolus has been completed.  MD notified. New orders for another 500cc NS bolus.

## 2017-12-08 NOTE — Clinical Social Work Placement (Signed)
   CLINICAL SOCIAL WORK PLACEMENT  NOTE  Date:  12/08/2017  Patient Details  Name: Kristin Kramer MRN: [id number] Date of Birth: 1941-01-23  Clinical Social Work is seeking post-discharge placement for this patient at the [place] level of care (*CSW will initial, date and re-position this form in  chart as items are completed):  Yes   Patient/family provided with [facility] Department's list of facilities offering this level of care within the geographic area requested by the patient (or if unable, by the patient's family).  Yes   Patient/family informed of their freedom to choose among providers that offer the needed level of care, that participate in Medicare, Medicaid or managed care program needed by the patient, have an available bed and are willing to accept the patient.  Yes   Patient/family informed of [facility]'s ownership interest in [facility] and [facility], as well as of the fact that they are under no obligation to receive care at these facilities.  PASRR submitted to EDS on       PASRR number received on       Existing PASRR number confirmed on 12/08/17     FL2 transmitted to all facilities in geographic area requested by pt/family on 12/08/17     FL2 transmitted to all facilities within larger geographic area on       Patient informed that his/her managed care company has contracts with or will negotiate with certain facilities, including the following:        Yes   Patient/family informed of bed offers received.  Patient chooses bed at [facility] )     Physician recommends and patient chooses bed at      Patient to be transferred to   on  .  Patient to be transferred to facility by       Patient family notified on   of transfer.  Name of family member notified:        PHYSICIAN       Additional Comment:    _______________________________________________ Andreika Vandagriff, Veronia Beets, LCSW 12/08/2017, 2:26  PM

## 2017-12-08 NOTE — Clinical Social Work Note (Signed)
Clinical Social Work Assessment  Patient Details  Name: Kristin Kramer MRN: [id number] Date of Birth: 09/08/1940  Date of referral:  12/08/17               Reason for consult:  Facility Placement                Permission sought to share information with:  Facility Contact Representative Permission granted to share information::  Yes, Verbal Permission Granted  Name::      Skilled Nursing Facility   Agency::   [place]   Relationship::     Contact Information:     Housing/Transportation Living arrangements for the past 2 months:  Single Family Home Source of Information:  Patient Patient Interpreter Needed:  None Criminal Activity/Legal Involvement Pertinent to Current Situation/Hospitalization:  No - Comment as needed Significant Relationships:  Siblings Lives with:  Self Do you feel safe going back to the place where you live?  Yes Need for family participation in patient care:  Yes (Comment)  Care giving concerns:  Patient lives alone in [place].    Social Worker assessment / plan:  Clinical Social Worker (CSW) received SNF consult. PT is recommending SNF. CSW met with patient alone at bedside to discuss D/C plan. Patient was alert and oriented X4 and was sitting up in the chair at bedside. CSW introduced self and explained role of CSW department. Per patient she lives alone in [place] and her brother and sister in law are her primary support. CSW explained SNF process and that medicare requires a 3 night qualifying inpatient stay in the hospital in order to pay for SNF. Patient was admitted to inpatient 12/07/17. Patient verbalized her understanding and is agreeable to SNF search in [place]. FL2 complete and faxed out.   CSW presented bed offers to patient and she chose [facility]. Plan is for patient to D/C to [facility] Sunday 12/10/17 to room 207-B pending medical clearance. [facility] admissions coordinator at [facility] is aware of above.   Employment status:   Retired Insurance information:  Medicare PT Recommendations:  Skilled Nursing Facility Information / Referral to community resources:  Skilled Nursing Facility  Patient/Family's Response to care:  Patient is agreeable to D/C to [facility].   Patient/Family's Understanding of and Emotional Response to Diagnosis, Current Treatment, and Prognosis:  Patient was very pleasant and thanked CSW for assistance.   Emotional Assessment Appearance:  Appears stated age Attitude/Demeanor/Rapport:    Affect (typically observed):  Accepting, Adaptable, Pleasant Orientation:  Oriented to Self, Oriented to Place, Oriented to  Time, Oriented to Situation Alcohol / Substance use:  Not Applicable Psych involvement (Current and /or in the community):  No (Comment)  Discharge Needs  Concerns to be addressed:  Discharge Planning Concerns Readmission within the last 30 days:  No Current discharge risk:  Dependent with Mobility Barriers to Discharge:  Continued Medical Work up   ,  M, LCSW 12/08/2017, 2:27 PM  

## 2017-12-08 NOTE — Progress Notes (Signed)
Physical Therapy Treatment Patient Details Name: Kristin Kramer MRN: [id number] DOB: 28-Sep-1940 Today's Date: 12/08/2017    History of Present Illness Pt is a 77 y.o. 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  PMH includes B TKR.    PT Comments    Pt reporting 3/10 pain R hip upon PT arrival, reporting significant increased pain with ambulation, but 0/10 pain end of session resting in chair.  Pt able to stand with min assist but after walking a few feet with RW pt noted to start leaning forward with eyes closed and very slow to respond to therapist; PT provided assist to safely sit pt back into recliner (chair follow was provided for safety).  Pt with improved alertness again once sitting in chair but reporting that she felt like she was passing out when she was up but now just felt weak and lightheaded.  Vitals taken and WNL (BP 128/66) and pt still feeling lightheaded.  Pt's nurse immediately contacted and came to assess pt.  Pt then reporting symptoms improved; pt then repositioned in chair for comfort.  Will continue to progress pt with strengthening and progressive functional mobility per pt tolerance.    Follow Up Recommendations  SNF     Equipment Recommendations  Rolling walker with 5" wheels;3in1 (PT)    Recommendations for Other Services OT consult     Precautions / Restrictions Precautions Precautions: Fall;Posterior Hip Precaution Booklet Issued: Yes (comment) Precaution Comments: Wound vac Restrictions Weight Bearing Restrictions: Yes RLE Weight Bearing: Weight bearing as tolerated    Mobility  Bed Mobility               General bed mobility comments: Deferred d/t pt up in chair beginning/end of session  Transfers Overall transfer level: Needs assistance Equipment used: Rolling walker (2 wheeled) Transfers: Sit to/from Stand Sit to Stand: Min assist         General transfer comment: vc's for posterior hip precautions and transfer technique; assist to initiate stand  Ambulation/Gait Ambulation/Gait assistance: Min assist Gait Distance (Feet): 3 Feet Assistive device: Rolling walker (2 wheeled)   Gait velocity: decreased   General Gait Details: minimal WB'ing through R LE d/t pain; vc's for increased UE use through RW to offweight R LE d/t pain   Stairs             Wheelchair Mobility    Modified Rankin (Stroke Patients Only)       Balance Overall balance assessment: Needs assistance Sitting-balance support: Single extremity supported;Feet supported Sitting balance-Leahy Scale: Poor Sitting balance - Comments: pt requiring single UE support d/t R hip pain Postural control: Left lateral lean(d/t R hip pain) Standing balance support: Bilateral upper extremity supported Standing balance-Leahy Scale: Poor Standing balance comment: pt requiring B UE support for static standing balance                            Cognition Arousal/Alertness: Awake/alert Behavior During Therapy: WFL for tasks assessed/performed Overall Cognitive Status: Within Functional Limits for tasks assessed                                        Exercises Total Joint Exercises Long Arc Quad: AROM;Strengthening;Right;10 reps;Seated    General Comments General comments (skin integrity, edema,  etc.): R hip wound vac dressing intact      Pertinent Vitals/Pain Pain Assessment: 0-10 Pain Score: 0-No pain Pain Location: Right hip Pain Descriptors / Indicators: Sore;Guarding Pain Intervention(s): Limited activity within patient's tolerance;Monitored during session;Premedicated before session;Repositioned    Home Living Family/patient expects to be discharged to:: Private residence Living Arrangements: Alone   Type of Home: (Condo) Home Access: Level entry   Home Layout: One level Home Equipment: Environmental consultant - 2  wheels;Cane - single point;Bedside commode;Shower seat      Prior Function Level of Independence: Independent with assistive device(s)      Comments: Recently using RW or SPC depending on activity and pain.   PT Goals (current goals can now be found in the care plan section) Acute Rehab PT Goals Patient Stated Goal: to be able to walk again PT Goal Formulation: With patient Time For Goal Achievement: 12/21/17 Potential to Achieve Goals: Good Progress towards PT goals: Progressing toward goals    Frequency    BID      PT Plan Current plan remains appropriate    Co-evaluation              AM-PAC PT "6 Clicks" Daily Activity  Outcome Measure  Difficulty turning over in bed (including adjusting bedclothes, sheets and blankets)?: Unable Difficulty moving from lying on back to sitting on the side of the bed? : Unable Difficulty sitting down on and standing up from a chair with arms (e.g., wheelchair, bedside commode, etc,.)?: Unable Help needed moving to and from a bed to chair (including a wheelchair)?: A Little Help needed walking in hospital room?: A Lot Help needed climbing 3-5 steps with a railing? : Total 6 Click Score: 9    End of Session Equipment Utilized During Treatment: Gait belt Activity Tolerance: Patient limited by pain Patient left: in chair;with call bell/phone within reach;with chair alarm set;with SCD's reapplied;Other (comment)(pillow placed between pt's knees for posterior hip precautions; B heels elevated via pillow) Nurse Communication: Mobility status;Precautions;Weight bearing status;Other (comment)(Pt's pain status and pt's symptoms) PT Visit Diagnosis: Other abnormalities of gait and mobility (R26.89);Muscle weakness (generalized) (M62.81);History of falling (Z91.81);Difficulty in walking, not elsewhere classified (R26.2);Pain Pain - Right/Left: Right Pain - part of body: Hip     Time: 6286-3817 PT Time Calculation (min) (ACUTE ONLY): 23  min  Charges:  $Therapeutic Exercise: 8-22 mins $Therapeutic Activity: 8-22 mins                    Leitha Bleak, PT 12/08/17, 3:37 PM [phone]

## 2017-12-08 NOTE — Progress Notes (Signed)
Patient's BP has been running low, but lastest BP was 95/50. Notified MD. New orders to give 500cc NS bolus.

## 2017-12-08 NOTE — Progress Notes (Signed)
Subjective: 1 Day Post-Op Procedure(s) (LRB): CONVERSION TO TOTAL HIP ( REMOVING SMITH AND NEPHEW NAIL) (Right) Patient reports pain as 3 on 0-10 scale.   Patient is well, and has had no acute complaints or problems.  Nausea and vomiting from yesterday and last night significant better today. Denies any CP, SOB, ABD pain. We will continue therapy today.   Objective: Vital signs in last 24 hours: Temp:  [97.4 F (36.3 C)-99.6 F (37.6 C)] 98.7 F (37.1 C) (10/04 0721) Pulse Rate:  [49-86] 85 (10/04 0721) Resp:  [11-19] 19 (10/04 0404) BP: (88-122)/(50-66) 106/56 (10/04 0721) SpO2:  [93 %-100 %] 95 % (10/04 0721)  Intake/Output from previous day: 10/03 0701 - 10/04 0700 In: 3122.1 [P.O.:600; I.V.:2122.1; Blood:250; IV Piggyback:150] Out: 2486 [Urine:1510; Emesis/NG output:401; Blood:575] Intake/Output this shift: No intake/output data recorded.  Recent Labs    12/05/17 1548  HGB 13.9   Recent Labs    12/05/17 1548  WBC 6.1  RBC 4.59  HCT 40.2  PLT 260   Recent Labs    12/05/17 1548 12/08/17 0516  NA 141 139  K 3.6 4.1  CL 101 105  CO2 29 30  BUN 11 12  CREATININE 0.65 0.63  GLUCOSE 91 144*  CALCIUM 9.8 8.4*   Recent Labs    12/05/17 1548  INR 0.94    EXAM General - Patient is Alert, Appropriate and Oriented Extremity - Neurovascular intact Sensation intact distally Intact pulses distally Dorsiflexion/Plantar flexion intact No cellulitis present Compartment soft Dressing - dressing C/D/I and no drainage, wound VAC intact without drainage. Motor Function - intact, moving foot and toes well on exam.   Past Medical History:  Diagnosis Date  . 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 Abnormal ECG    a. left axis deviation;  b. 12/2011 Echo: EF 55-60%, no rwma, pasp 96mmHg.  . Arthritis   . Depression    prior on lexapro, celexa, wellbutrin.  high  cymbalta doses cause tremors  . GERD (gastroesophageal reflux disease)   . H/O seasonal allergies   . Herpes zoster 02/19/2014  . History of pneumonia   . History of UTI    12/2011 - Treatment began preoperatively with CIPRO 500mg  BID  . HLD (hyperlipidemia)    statin intolerance  . Knee osteoarthritis 2013, 2014   bilateral s/p B TKR [facility])  . Knee osteoarthritis   . 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 Migraines   . Osteopenia 08/2016   hip -2.4, spine -1.4  . Post-operative nausea and vomiting   . Pre-diabetes   . Refusal of blood transfusions as patient is Jehovah's Witness   . Seasonal allergies    cats, dust, mold, roaches  . 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 Isac Sarna), R tibia - then recurrent  . Tubular adenoma of colon 08/2014  . Vitamin D deficiency     Assessment/Plan:   1 Day Post-Op Procedure(s) (LRB): CONVERSION TO TOTAL HIP ( REMOVING SMITH AND NEPHEW NAIL) (Right) Active Problems:   Status post total hip replacement, right  Estimated body mass index is 25.82 kg/m as calculated from the following:   Height as of this encounter: 5\' 4"  (1.626 m).   Weight as of this encounter: 68.2 kg. Advance diet Up with  therapy  CBC pending for this morning. Vital signs stable Encouraged incentive spirometer. Care management to assist with discharge    DVT Prophylaxis - Aspirin, TED hose and SCDs Weight-Bearing as tolerated to right leg   T. Rachelle Hora, PA-C [place] 12/08/2017, 8:19 AM

## 2017-12-08 NOTE — Anesthesia Postprocedure Evaluation (Signed)
Anesthesia Post Note  Patient: Kristin Kramer  Procedure(s) Performed: CONVERSION TO TOTAL HIP ( REMOVING SMITH AND NEPHEW NAIL) (Right Hip)  Patient location during evaluation: Other Anesthesia Type: Spinal Level of consciousness: oriented and awake and alert Pain management: pain level controlled Vital Signs Assessment: post-procedure vital signs reviewed and stable Respiratory status: spontaneous breathing, respiratory function stable and patient connected to nasal cannula oxygen Cardiovascular status: blood pressure returned to baseline and stable Postop Assessment: no headache, no backache and no apparent nausea or vomiting Anesthetic complications: no     Last Vitals:  Vitals:   12/08/17 0404 12/08/17 0721  BP: (!) 113/57 (!) 106/56  Pulse: 79 85  Resp: 19   Temp: 37.3 C 37.1 C  SpO2: 93% 95%    Last Pain:  Vitals:   12/08/17 0923  TempSrc:   PainSc: 3                  Alison Stalling

## 2017-12-09 LAB — BASIC METABOLIC PANEL
ANION GAP: 7 (ref 5–15)
BUN: 10 mg/dL (ref 8–23)
CALCIUM: 8.4 mg/dL — AB (ref 8.9–10.3)
CO2: 28 mmol/L (ref 22–32)
Chloride: 104 mmol/L (ref 98–111)
Creatinine, Ser: 0.43 mg/dL — ABNORMAL LOW (ref 0.44–1.00)
Glucose, Bld: 129 mg/dL — ABNORMAL HIGH (ref 70–99)
POTASSIUM: 3.6 mmol/L (ref 3.5–5.1)
Sodium: 139 mmol/L (ref 135–145)

## 2017-12-09 LAB — CBC
HCT: 33.7 % — ABNORMAL LOW (ref 35.0–47.0)
Hemoglobin: 11.3 g/dL — ABNORMAL LOW (ref 12.0–16.0)
MCH: 29 pg (ref 26.0–34.0)
MCHC: 33.6 g/dL (ref 32.0–36.0)
MCV: 86.3 fL (ref 80.0–100.0)
PLATELETS: 174 10*3/uL (ref 150–440)
RBC: 3.91 MIL/uL (ref 3.80–5.20)
RDW: 14.1 % (ref 11.5–14.5)
WBC: 10.1 10*3/uL (ref 3.6–11.0)

## 2017-12-09 MED ORDER — HYDROCODONE-ACETAMINOPHEN 5-325 MG PO TABS: 1.0000 | ORAL_TABLET | ORAL | 0 refills | Status: DC | PRN

## 2017-12-09 MED ORDER — ASPIRIN 81 MG PO CHEW: 81.0000 mg | CHEWABLE_TABLET | Freq: Two times a day (BID) | ORAL | 0 refills | Status: AC

## 2017-12-09 MED ORDER — METHOCARBAMOL 500 MG PO TABS: 500.0000 mg | ORAL_TABLET | Freq: Four times a day (QID) | ORAL | Status: DC | PRN

## 2017-12-09 NOTE — Discharge Instructions (Signed)
ANTERIOR APPROACH TOTAL HIP REPLACEMENT POSTOPERATIVE DIRECTIONS   Hip Rehabilitation, Guidelines Following Surgery  The results of a hip operation are greatly improved after range of motion and muscle strengthening exercises. Follow all safety measures which are given to protect your hip. If any of these exercises cause increased pain or swelling in your joint, decrease the amount until you are comfortable again. Then slowly increase the exercises. Call your caregiver if you have problems or questions.   HOME CARE INSTRUCTIONS  Remove items at home which could result in a fall. This includes throw rugs or furniture in walking pathways.   ICE to the affected hip every three hours for 30 minutes at a time and then as needed for pain and swelling.  Continue to use ice on the hip for pain and swelling from surgery. You may notice swelling that will progress down to the foot and ankle.  This is normal after surgery.  Elevate the leg when you are not up walking on it.    Continue to use the breathing machine which will help keep your temperature down.  It is common for your temperature to cycle up and down following surgery, especially at night when you are not up moving around and exerting yourself.  The breathing machine keeps your lungs expanded and your temperature down.  Do not place pillow under knee, focus on keeping the knee straight while resting  DIET You may resume your previous home diet once your are discharged from the hospital.  DRESSING / WOUND CARE / SHOWERING Please remove provena negative pressure dressing on 12/17/2017 and apply honey comb dressing. Keep dressing clean and dry at all times.  ACTIVITY Walk with your walker as instructed. Use walker as long as suggested by your caregivers. Avoid periods of inactivity such as sitting longer than an hour when not asleep. This helps prevent blood clots.  You may resume a sexual relationship in one month or when given the OK by  your doctor.  You may return to work once you are cleared by your doctor.  Do not drive a car for 6 weeks or until released by you surgeon.  Do not drive while taking narcotics.  WEIGHT BEARING Weight bearing as tolerated. Use walker/cane as needed for at least 4 weeks post op.  POSTOPERATIVE CONSTIPATION PROTOCOL Constipation - defined medically as fewer than three stools per week and severe constipation as less than one stool per week.  One of the most common issues patients have following surgery is constipation.  Even if you have a regular bowel pattern at home, your normal regimen is likely to be disrupted due to multiple reasons following surgery.  Combination of anesthesia, postoperative narcotics, change in appetite and fluid intake all can affect your bowels.  In order to avoid complications following surgery, here are some recommendations in order to help you during your recovery period.  Colace (docusate) - Pick up an over-the-counter form of Colace or another stool softener and take twice a day as long as you are requiring postoperative pain medications.  Take with a full glass of water daily.  If you experience loose stools or diarrhea, hold the colace until you stool forms back up.  If your symptoms do not get better within 1 week or if they get worse, check with your doctor.  Dulcolax (bisacodyl) - Pick up over-the-counter and take as directed by the product packaging as needed to assist with the movement of your bowels.  Take with a full  glass of water.  Use this product as needed if not relieved by Colace only.  ° °MiraLax (polyethylene glycol) - Pick up over-the-counter to have on hand.  MiraLax is a solution that will increase the amount of water in your bowels to assist with bowel movements.  Take as directed and can mix with a glass of water, juice, soda, coffee, or tea.  Take if you go more than two days without a movement. °Do not use MiraLax more than once per day. Call your  doctor if you are still constipated or irregular after using this medication for 7 days in a row. ° °If you continue to have problems with postoperative constipation, please contact the office for further assistance and recommendations.  If you experience "the worst abdominal pain ever" or develop nausea or vomiting, please contact the office immediatly for further recommendations for treatment. ° °ITCHING ° If you experience itching with your medications, try taking only a single pain pill, or even half a pain pill at a time.  You can also use Benadryl over the counter for itching or also to help with sleep.  ° °TED HOSE STOCKINGS °Wear the elastic stockings on both legs for six weeks following surgery during the day but you may remove then at night for sleeping. ° °MEDICATIONS °See your medication summary on the “After Visit Summary” that the nursing staff will review with you prior to discharge.  You may have some home medications which will be placed on hold until you complete the course of blood thinner medication.  It is important for you to complete the blood thinner medication as prescribed by your surgeon.  Continue your approved medications as instructed at time of discharge. ° °PRECAUTIONS °If you experience chest pain or shortness of breath - call 911 immediately for transfer to the hospital emergency department.  °If you develop a fever greater that 101 F, purulent drainage from wound, increased redness or drainage from wound, foul odor from the wound/dressing, or calf pain - CONTACT YOUR SURGEON.   °                                                °FOLLOW-UP APPOINTMENTS °Make sure you keep all of your appointments after your operation with your surgeon and caregivers. You should call the office at the above phone number and make an appointment for approximately two weeks after the date of your surgery or on the date instructed by your surgeon outlined in the "After Visit Summary". ° °RANGE OF MOTION  AND STRENGTHENING EXERCISES  °These exercises are designed to help you keep full movement of your hip joint. Follow your caregiver's or physical therapist's instructions. Perform all exercises about fifteen times, three times per day or as directed. Exercise both hips, even if you have had only one joint replacement. These exercises can be done on a training (exercise) mat, on the floor, on a table or on a bed. Use whatever works the best and is most comfortable for you. Use music or television while you are exercising so that the exercises are a pleasant break in your day. This will make your life better with the exercises acting as a break in routine you can look forward to.  °Lying on your back, slowly slide your foot toward your buttocks, raising your knee up off the floor. Then slowly   slide your foot back down until your leg is straight again.  °Lying on your back spread your legs as far apart as you can without causing discomfort.  °Lying on your side, raise your upper leg and foot straight up from the floor as far as is comfortable. Slowly lower the leg and repeat.  °Lying on your back, tighten up the muscle in the front of your thigh (quadriceps muscles). You can do this by keeping your leg straight and trying to raise your heel off the floor. This helps strengthen the largest muscle supporting your knee.  °Lying on your back, tighten up the muscles of your buttocks both with the legs straight and with the knee bent at a comfortable angle while keeping your heel on the floor.  ° °IF YOU ARE TRANSFERRED TO A SKILLED REHAB FACILITY °If the patient is transferred to a skilled rehab facility following release from the hospital, a list of the current medications will be sent to the facility for the patient to continue.  When discharged from the skilled rehab facility, please have the facility set up the patient's Home Health Physical Therapy prior to being released. Also, the skilled facility will be responsible  for providing the patient with their medications at time of release from the facility to include their pain medication, the muscle relaxants, and their blood thinner medication. If the patient is still at the rehab facility at time of the two week follow up appointment, the skilled rehab facility will also need to assist the patient in arranging follow up appointment in our office and any transportation needs. ° °MAKE SURE YOU:  °Understand these instructions.  °Get help right away if you are not doing well or get worse.  ° ° °Pick up stool softner and laxative for home use following surgery while on pain medications. °Continue to use ice for pain and swelling after surgery. °Do not use any lotions or creams on the incision until instructed by your surgeon. ° °

## 2017-12-09 NOTE — Progress Notes (Signed)
Physical Therapy Treatment Patient Details Name: Kristin Kramer MRN: [id number] DOB: [birth year] Today's Date: 12/09/2017    History of Present Illness Pt is a 77 y.o. 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  PMH includes B TKR.    PT Comments    Pt in recliner, ready for session.  Reports feeling better.  Participated in exercises as described below.  Pt voiced fear of dislocation with movement.  Extensive education and reassurance given regarding movement and realistic fear and caution.  Pt voiced feeling better and more relaxed after session.  Stood with min guard/assist and was able to ambulate to nursing desk and back with walker and min guard/assist.  Some verbal cues for walker placement but overall does well with no LOB or reports of dizziness today.  Will continue with BID PT and assessment on appropriate discharge disposition as she progresses.   Follow Up Recommendations  SNF     Equipment Recommendations  Rolling walker with 5" wheels;3in1 (PT)    Recommendations for Other Services       Precautions / Restrictions Precautions Precautions: Fall;Posterior Hip Precaution Booklet Issued: Yes (comment) Precaution Comments: Wound vac Restrictions Weight Bearing Restrictions: Yes RLE Weight Bearing: Weight bearing as tolerated    Mobility  Bed Mobility               General bed mobility comments: Deferred d/t pt up in chair beginning/end of session - pt reported that it was easier than yesterday  Transfers Overall transfer level: Needs assistance Equipment used: Rolling walker (2 wheeled) Transfers: Sit to/from Stand Sit to Stand: Min assist;Min guard         General transfer comment: slow but little assistance needed.  Ambulation/Gait Ambulation/Gait assistance: Min guard;Min assist Gait Distance (Feet): 100 Feet Assistive device: Rolling walker (2  wheeled) Gait Pattern/deviations: Step-to pattern Gait velocity: decreased       Stairs             Wheelchair Mobility    Modified Rankin (Stroke Patients Only)       Balance Overall balance assessment: Needs assistance Sitting-balance support: Single extremity supported;Feet supported Sitting balance-Leahy Scale: Good     Standing balance support: Bilateral upper extremity supported Standing balance-Leahy Scale: Fair Standing balance comment: pt requiring B UE support for static standing balance                            Cognition Arousal/Alertness: Awake/alert Behavior During Therapy: WFL for tasks assessed/performed Overall Cognitive Status: Within Functional Limits for tasks assessed                                        Exercises Total Joint Exercises Ankle Circles/Pumps: AROM;Strengthening;Both;10 reps;Supine Quad Sets: AROM;Strengthening;Both;10 reps;Supine Gluteal Sets: AROM;Strengthening;Both;10 reps;Supine Towel Squeeze: AROM;Strengthening;Both;10 reps;Supine Short Arc Quad: AAROM;Strengthening;Right;10 reps;Supine Heel Slides: AAROM;Strengthening;Right;10 reps;Supine Hip ABduction/ADduction: AAROM;Strengthening;Right;10 reps;Supine    General Comments        Pertinent Vitals/Pain Pain Assessment: No/denies pain Pain Location: reports being comfortable Pain Intervention(s): Premedicated before session;Ice applied;Monitored during session    Home Living                      Prior Function  PT Goals (current goals can now be found in the care plan section) Progress towards PT goals: Progressing toward goals    Frequency    BID      PT Plan Current plan remains appropriate    Co-evaluation              AM-PAC PT "6 Clicks" Daily Activity  Outcome Measure  Difficulty turning over in bed (including adjusting bedclothes, sheets and blankets)?: Unable Difficulty moving from  lying on back to sitting on the side of the bed? : Unable Difficulty sitting down on and standing up from a chair with arms (e.g., wheelchair, bedside commode, etc,.)?: Unable Help needed moving to and from a bed to chair (including a wheelchair)?: A Little Help needed walking in hospital room?: A Little Help needed climbing 3-5 steps with a railing? : A Lot 6 Click Score: 11    End of Session Equipment Utilized During Treatment: Gait belt Activity Tolerance: Patient tolerated treatment well Patient left: in chair;with bed alarm set Nurse Communication: Mobility status Pain - Right/Left: Right Pain - part of body: Hip     Time: 0315-9458 PT Time Calculation (min) (ACUTE ONLY): 24 min  Charges:  $Gait Training: 8-22 mins $Therapeutic Exercise: 8-22 mins                     Chesley Noon, PTA 12/09/17, 9:11 AM

## 2017-12-09 NOTE — Progress Notes (Signed)
Occupational Therapy Treatment Patient Details Name: Kristin Kramer MRN: [id number] DOB: 18-Feb-1941 Today's Date: 12/09/2017    History of present illness Pt is a 77 y.o. 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  PMH includes B TKR.   OT comments  Pt is progressing toward stated goals. Focused session this date on LB ADL training. Pt up in recliner just finished with PT session at this time. Asked pt to recall posterior hip precautions, only recalling 2/3 with increased time. Issued additional h/o with pictures and related precautions to ADL for further example. Pt in understanding. Sock aide used to don R sock with min A and VC's for hand placement, pt very excited and eager to acquire to increase independence. Pt reports owning long handled sponge and its importance for LB bathing with precautions. Pt simulated putting on a pair of paints with long handled reacher, in understanding and interested in acquiring this date. Introduced a Environmental education officer for safety to store cell phone, pt very eager in acquiring this date. D/c of SNF remains appropriate at this time. Will continue to follow pt acutely.    Follow Up Recommendations  SNF    Equipment Recommendations  Other (comment)(LB dressing equipment, will be addressed next venue)    Recommendations for Other Services      Precautions / Restrictions Precautions Precautions: Fall;Posterior Hip Precaution Booklet Issued: Yes (comment) Precaution Comments: Wound vac Restrictions Weight Bearing Restrictions: Yes RLE Weight Bearing: Weight bearing as tolerated       Mobility Bed Mobility               General bed mobility comments: not tested this date, pt up in chair at beginning/end of session  Transfers Overall transfer level: Needs assistance Equipment used: Rolling walker (2 wheeled) Transfers: Sit to/from Stand Sit to  Stand: Min assist;Min guard         General transfer comment: not tested this date    Balance Overall balance assessment: Needs assistance Sitting-balance support: No upper extremity supported;Feet supported Sitting balance-Leahy Scale: Good Sitting balance - Comments: occasionally uses single UE 2/2 to R hip pain   Standing balance support: Bilateral upper extremity supported Standing balance-Leahy Scale: Fair Standing balance comment: pt requiring B UE support for static standing balance                           ADL either performed or assessed with clinical judgement   ADL Overall ADL's : Needs assistance/impaired             Lower Body Bathing: With adaptive equipment;Sit to/from stand;Moderate assistance       Lower Body Dressing: Minimal assistance;With adaptive equipment;Sit to/from stand;Cueing for compensatory techniques Lower Body Dressing Details (indicate cue type and reason): to don socks with sock aide               General ADL Comments: Pt is progressing toward stated goals. Focused session on LB ADL training. Pt trialing sock aide this date and given instruction and practice with reacher. Pt also recommended to add bag to RW to store cell phone for safety purposes.     Vision Baseline Vision/History: No visual deficits Patient Visual Report: No change from baseline     Perception     Praxis      Cognition Arousal/Alertness: Awake/alert Behavior During Therapy: WFL for tasks assessed/performed  Overall Cognitive Status: Within Functional Limits for tasks assessed                                          Exercises Total Joint Exercises Ankle Circles/Pumps: AROM;Strengthening;Both;10 reps;Supine Quad Sets: AROM;Strengthening;Both;10 reps;Supine Gluteal Sets: AROM;Strengthening;Both;10 reps;Supine Towel Squeeze: AROM;Strengthening;Both;10 reps;Supine Short Arc Quad: AAROM;Strengthening;Right;10 reps;Supine Heel  Slides: AAROM;Strengthening;Right;10 reps;Supine Hip ABduction/ADduction: AAROM;Strengthening;Right;10 reps;Supine   Shoulder Instructions       General Comments      Pertinent Vitals/ Pain       Pain Assessment: Faces Faces Pain Scale: No hurt Pain Location: reports pain controlled this date Pain Intervention(s): Premedicated before session;Ice applied;Monitored during session  Home Living                                          Prior Functioning/Environment              Frequency  Min 2X/week        Progress Toward Goals  OT Goals(current goals can now be found in the care plan section)  Progress towards OT goals: Progressing toward goals  Acute Rehab OT Goals Patient Stated Goal: to be independent again OT Goal Formulation: With patient Potential to Achieve Goals: Good  Plan Discharge plan remains appropriate    Co-evaluation                 AM-PAC PT "6 Clicks" Daily Activity     Outcome Measure   Help from another person eating meals?: None Help from another person taking care of personal grooming?: None Help from another person toileting, which includes using toliet, bedpan, or urinal?: A Lot Help from another person bathing (including washing, rinsing, drying)?: A Lot Help from another person to put on and taking off regular upper body clothing?: None Help from another person to put on and taking off regular lower body clothing?: A Little 6 Click Score: 19    End of Session    OT Visit Diagnosis: Muscle weakness (generalized) (M62.81);Other abnormalities of gait and mobility (R26.89)   Activity Tolerance Patient tolerated treatment well   Patient Left in chair;with call bell/phone within reach;with chair alarm set   Nurse Communication          Time: [phone] OT Time Calculation (min): 17 min  Charges: OT General Charges $OT Visit: 1 Visit OT Treatments $Self Care/Home Management : 8-22 mins  Zenovia Jarred, MSOT, OTR/L [place]: [place] 12/09/2017, 9:52 AM

## 2017-12-09 NOTE — Plan of Care (Signed)

## 2017-12-09 NOTE — Discharge Summary (Addendum)
Physician Discharge Summary  Patient ID: Kristin Kramer MRN: [id number] DOB/AGE: 1940-10-16 77 y.o.  Admit date: 12/07/2017 Discharge date: 12/10/2017 Admission Diagnoses:  [place] OF RIGHT FEMUR  Discharge Diagnoses: Patient Active Problem List   Diagnosis Date Noted  . Status post total hip replacement, right 12/07/2017  . Closed intertrochanteric fracture with nonunion, right 11/28/2017  . Facial laceration 11/24/2017  . Pain in right hip 10/06/2017  . Status post hip surgery 07/15/2017  . Closed right hip fracture ([place]) 07/07/2017  . Chronic nasal congestion 04/19/2017  . Exertional dyspnea 04/19/2017  . Memory deficit 08/05/2016  . 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 Advanced care planning/counseling discussion 07/31/2014  . Diarrhea 07/31/2014  . Right rib fracture 05/21/2014  . 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  . Post-operative nausea and vomiting   . Refusal of blood transfusions as patient is Jehovah's Witness   . Medicare annual wellness visit, subsequent 01/14/2013  . Transaminitis 01/03/2013  . 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 Osteopenia   . HLD (hyperlipidemia)   . Vitamin D deficiency   . Seasonal allergies   . Squamous cell skin cancer   . MDD (major depressive disorder), recurrent episode, moderate ([place])   . Migraines   . Abnormal EKG     Past Medical History:  Diagnosis Date  . Abdominal pain, other specified site 10.2014   [facility] ER - mesenteric adenitis vs sclerosing mesenteritis vs nonspecific lymphadenitis  . Abnormal ECG    a. left axis deviation;  b. 12/2011 Echo: EF 55-60%, no rwma, pasp 74mmHg.  . Arthritis   . Depression    prior on lexapro, celexa, wellbutrin.  high cymbalta doses cause tremors  . GERD (gastroesophageal reflux disease)   . H/O seasonal allergies   . Herpes zoster 02/19/2014  . History of pneumonia   . History of UTI    12/2011 -  Treatment began preoperatively with CIPRO 500mg  BID  . HLD (hyperlipidemia)    statin intolerance  . Knee osteoarthritis 2013, 2014   bilateral s/p B TKR Noemi Chapel)  . Knee osteoarthritis   . 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 Migraines   . Osteopenia 08/2016   hip -2.4, spine -1.4  . Post-operative nausea and vomiting   . Pre-diabetes   . Refusal of blood transfusions as patient is Jehovah's Witness   . Seasonal allergies    cats, dust, mold, roaches  . 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 Isac Sarna), R tibia - then recurrent  . Tubular adenoma of colon 08/2014  . Vitamin D deficiency      Transfusion: none   Consultants (if any):   Discharged Condition: Improved  Hospital Course: Kristin Kramer is an 77 y.o. female who was admitted 12/07/2017 with a diagnosis of Nonunion left intertrochanteric hip fracture with osteoarthritis and went to the operating room on 12/07/2017 and underwent the above named procedures.    Surgeries: Procedure(s): CONVERSION TO TOTAL HIP ( REMOVING SMITH AND NEPHEW NAIL) on 12/07/2017 Patient tolerated the surgery well. Taken to PACU where she was stabilized and then transferred to the orthopedic floor.  Started on asprin 81 mg BID, TEDS, SCDs. Heels elevated on bed with rolled towels. No evidence of DVT. Negative [place]. Physical therapy started on day #1 for gait training and transfer.  OT started day #1 for ADL and assisted devices.  Patient's foley was d/c on day #1. Patient's IV and hemovac was d/c on day #2.  On post op day #3 patient was stable and ready for discharge to SNF.  Implants: Medacta AMIS 3 standard stem with 50 mm Mpact DM cup and liner, M metal 28 mm head  Please remove provena negative pressure dressing on 12/17/2017 and apply honey comb dressing. Keep dressing clean and dry at all  times.   She was given perioperative antibiotics:  Anti-infectives (From admission, onward)   Start     Dose/Rate Route Frequency Ordered Stop   12/07/17 1400  ceFAZolin (ANCEF) IVPB 1 g/50 mL premix     1 g 100 mL/hr over 30 Minutes Intravenous Every 6 hours 12/07/17 1200 12/08/17 0149   12/07/17 0836  gentamicin 80 mg in 0.9% sodium chloride 250 mL irrigation  Status:  Discontinued       As needed 12/07/17 0837 12/07/17 1026   12/07/17 0616  ceFAZolin (ANCEF) 2-4 GM/100ML-% IVPB    Note to Pharmacy:  [place]: cabinet override      12/07/17 0616 12/07/17 0752   12/06/17 2230  ceFAZolin (ANCEF) IVPB 2g/100 mL premix     2 g 200 mL/hr over 30 Minutes Intravenous  Once 12/06/17 2223 12/07/17 0752    .  She was given sequential compression devices, early ambulation, and Aspirin for DVT prophylaxis.  She benefited maximally from the hospital stay and there were no complications.    Recent vital signs:  Vitals:   12/10/17 0006 12/10/17 0738  BP: (!) 97/54 110/66  Pulse: 86 85  Resp: 15   Temp: 98.6 F (37 C) 99.4 F (37.4 C)  SpO2: 95% 96%    Recent laboratory studies:  Lab Results  Component Value Date   HGB 10.4 (L) 12/10/2017   HGB 11.3 (L) 12/09/2017   HGB 11.3 (L) 12/08/2017   Lab Results  Component Value Date   WBC 7.8 12/10/2017   PLT 200 12/10/2017   Lab Results  Component Value Date   INR 0.94 12/05/2017   Lab Results  Component Value Date   NA 137 12/10/2017   K 3.7 12/10/2017   CL 100 12/10/2017   CO2 32 12/10/2017   BUN 14 12/10/2017   CREATININE 0.58 12/10/2017   GLUCOSE 126 (H) 12/10/2017    Discharge Medications:   Allergies as of 12/10/2017      Reactions   Lipitor [atorvastatin] Other (See Comments)   Cramps   Pravastatin Other (See Comments)   Cramps       Medication List    STOP taking these medications   traMADol 50 MG tablet Commonly known as:  ULTRAM     TAKE these medications   acetaminophen 500 MG  tablet Commonly known as:  TYLENOL Take 500 mg by mouth 2 (two) times daily as needed (for pain.).   aspirin 81 MG chewable tablet Chew 1 tablet (81 mg total) by mouth 2 (two) times daily.   COQ10 PO Take by mouth daily.   donepezil 5 MG tablet Commonly known as:  ARICEPT Take 1 tablet (5 mg total) by mouth daily. What changed:  when to take this   FISH OIL PO Take 1,600 mg by mouth daily.   glycopyrrolate 1 MG tablet Commonly known as:  ROBINUL Take 1 tablet (1 mg total) by mouth 2 (two) times daily.   HYDROcodone-acetaminophen 5-325 MG tablet Commonly known as:  NORCO/VICODIN Take 1-2 tablets by mouth every 4 (four) hours as needed for moderate pain (pain score 4-6). What changed:    when to take this  reasons to take this   loratadine 10 MG tablet Commonly known as:  CLARITIN Take 1 tablet (10 mg total) by mouth daily.   methocarbamol 500 MG tablet Commonly known as:  ROBAXIN Take 1 tablet (500 mg total) by mouth every 6 (six) hours as needed for muscle spasms.   metroNIDAZOLE 0.75 % cream Commonly known as:  METROCREAM Apply 1 application topically 2 (two) times daily. Applied daily to patient face   montelukast 10 MG tablet Commonly known as:  SINGULAIR TAKE 1 TABLET BY MOUTH EVERYDAY AT BEDTIME What changed:  See the new instructions.   ondansetron 8 MG disintegrating tablet Commonly known as:  ZOFRAN-ODT Take 1 tablet (8 mg total) by mouth every 8 (eight) hours as needed for nausea or vomiting.   PROBIOTIC PO Take 1 capsule by mouth daily.   ranitidine 150 MG tablet Commonly known as:  ZANTAC Take 150 mg by mouth 2 (two) times daily as needed (acid reflux/indigestion).   sertraline 25 MG tablet Commonly known as:  ZOLOFT Take 25-37.5 mg by mouth See admin instructions. Take 1 tablet (25 mg) by mouth on one night then alternate with taking 1.5 tablets (37.5 mg) every other night   Turmeric 500 MG Caps Take 1 capsule by mouth daily.   vitamin  B-12 1000 MCG tablet Commonly known as:  CYANOCOBALAMIN Take 1,000 mcg by mouth daily.   Vitamin D-3 5000 units Tabs Take 5,000 Units by mouth daily.       Diagnostic Studies: Dg Ribs Unilateral Right  Result Date: 11/24/2017 CLINICAL DATA:  Right lateral scratched it fall.  Right rib pain. EXAM: RIGHT RIBS - 2 VIEW COMPARISON:  Chest x-ray 07/07/2017. FINDINGS: Acute appearing right posterolateral fourth rib slightly displaced fracture. Slight deformity noted the right posterolateral ninth and tenth ribs suggesting fractures, age undetermined. No pneumothorax. Right breast implant. IMPRESSION: 1. Acute appearing right posterolateral fourth rib slightly displaced fracture. No pneumothorax. 2. Slight deformity noted the right posterolateral ninth and tenth ribs suggesting fractures, age undetermined. Electronically Signed   By: Marcello Moores  Register   On: 11/24/2017 14:30   Dg Sternum  Result Date: 11/24/2017 CLINICAL DATA:  RIGHT lateral rib pain after fall. EXAM: STERNUM - 2+ VIEW COMPARISON:  None. FINDINGS: There is no evidence of fracture deformity or other focal bone lesions. Partially calcified breast implant. IMPRESSION: Negative. Electronically Signed   By: Elon Alas M.D.   On: 11/24/2017 14:28   Ct Hip Right Wo Contrast  Result Date: 11/27/2017 CLINICAL DATA:  Persistent right hip pain since a femur fracture on 07/07/2017 EXAM: CT OF THE RIGHT HIP WITHOUT CONTRAST TECHNIQUE: Multidetector CT imaging of the right hip was performed according to the standard protocol. Multiplanar CT image reconstructions were also generated. COMPARISON:  Radiographs dated 11/21/2017, 07/08/2017 and 07/07/2017 FINDINGS: Bones/Joint/Cartilage There is nonunion of the comminuted fracture of the proximal right femur. Specifically, there is nonunion of the fracture of the tip of the right greater trochanter, nonunion of the avulsed lesser trochanter and nonunion of the intertrochanteric fracture. The hardware  appears in excellent position with no evidence of loosening. There is no evidence of avascular necrosis of the femoral head. There are only minimal arthritic changes of the right hip joint. Muscles and Tendons Normal. Soft tissues Normal. 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  Electronically Signed   By: Lorriane Shire M.D.   On: 11/27/2017 10:02   Dg Hip Operative Unilat W Or W/o Pelvis Right  Result Date: 12/07/2017 CLINICAL DATA:  Intramedullary nail removal and total right hip arthroplasty. EXAM: OPERATIVE right HIP (WITH PELVIS IF PERFORMED) 2 VIEWS TECHNIQUE: Fluoroscopic spot image(s) were submitted for interpretation post-operatively. COMPARISON:  Preoperative CT scan of February 26, 2018 FINDINGS: The patient's previous inter medullary nail was removed and subsequently a total right hip joint prosthesis was placed. Radiographic positioning of the prosthetic components is good. The interface of the native bone with the prosthesis appears good. IMPRESSION: Noted immediate complication following right total hip joint arthroplasty. Electronically Signed   By: David  Martinique M.D.   On: 12/07/2017 10:24   Dg Hip Unilat W Or W/o Pelvis 2-3 Views Right  Result Date: 12/07/2017 CLINICAL DATA:  Status post right hip replacement EXAM: DG HIP (WITH OR WITHOUT PELVIS) 2-3V RIGHT COMPARISON:  11/21/2017 FINDINGS: There is been removal of the previously seen proximal medullary rod and fixation screws. Total right hip replacement is now seen. Fracture fragments from previous trauma are noted. Pelvic ring is intact. No other focal abnormality is seen. IMPRESSION: Status post right hip replacement Electronically Signed   By: Inez Catalina M.D.   On: 12/07/2017 11:36   Xr Hip Unilat W Or W/o Pelvis 2-3 Views Right  Result Date: 11/21/2017 Stable intramedullary fixation.  Fracture appears to be healed.  Disposition: Plan is for discharge home with HHPT on 12/10/17.   Contact  information for follow-up providers    Duanne Guess, PA-C Follow up in 2 week(s).   Specialties:  Orthopedic Surgery, Emergency Medicine Contact information: [place] [address] [phone]            Contact information for after-discharge care    Destination    [facility] Preferred SNF .   Service:  Skilled Nursing Contact information: [address] [place] [phone]                 Signed: Judson Roch PA-C 12/10/2017, 9:17 AM

## 2017-12-09 NOTE — Progress Notes (Signed)
Subjective: 2 Days Post-Op Procedure(s) (LRB): CONVERSION TO TOTAL HIP ( REMOVING SMITH AND NEPHEW NAIL) (Right) Patient reports pain as 6 on 0-10 scale.   Patient is well, and has had no acute complaints or problems.   Reports that her N/V is improved this AM. Current plan is for discharge to rehab tomorrow 12/10/17. Denies any CP, SOB, ABD pain. We will continue therapy today.   Objective: Vital signs in last 24 hours: Temp:  [98 F (36.7 C)-99.6 F (37.6 C)] 98.3 F (36.8 C) (10/05 0737) Pulse Rate:  [77-93] 77 (10/05 0737) Resp:  [17] 17 (10/04 2305) BP: (95-133)/(48-59) 108/54 (10/05 0737) SpO2:  [89 %-100 %] 95 % (10/05 0737)  Intake/Output from previous day: 10/04 0701 - 10/05 0700 In: 1648.3 [P.O.:780; IV Piggyback:868.3] Out: 565 [Urine:565] Intake/Output this shift: No intake/output data recorded.  Recent Labs    12/08/17 0516 12/09/17 0416  HGB 11.3* 11.3*   Recent Labs    12/08/17 0516 12/09/17 0416  WBC 8.3 10.1  RBC 3.72* 3.91  HCT 33.0* 33.7*  PLT 194 174   Recent Labs    12/08/17 0516 12/09/17 0416  NA 139 139  K 4.1 3.6  CL 105 104  CO2 30 28  BUN 12 10  CREATININE 0.63 0.43*  GLUCOSE 144* 129*  CALCIUM 8.4* 8.4*   No results for input(s): LABPT, INR in the last 72 hours.  EXAM General - Patient is Alert, Appropriate and Oriented Extremity - Neurovascular intact Sensation intact distally Intact pulses distally Dorsiflexion/Plantar flexion intact No cellulitis present Compartment soft Dressing - dressing C/D/I and no drainage, wound VAC intact without drainage. Motor Function - intact, moving foot and toes well on exam.   Past Medical History:  Diagnosis Date  . Abdominal pain, other specified site 10.2014   [facility] ER - mesenteric adenitis vs sclerosing mesenteritis vs nonspecific lymphadenitis  . Abnormal ECG    a. left axis deviation;  b. 12/2011 Echo: EF 55-60%, no rwma, pasp 40mmHg.  . Arthritis   . Depression    prior on  lexapro, celexa, wellbutrin.  high cymbalta doses cause tremors  . GERD (gastroesophageal reflux disease)   . H/O seasonal allergies   . Herpes zoster 02/19/2014  . History of pneumonia   . History of UTI    12/2011 - Treatment began preoperatively with CIPRO 500mg  BID  . HLD (hyperlipidemia)    statin intolerance  . Knee osteoarthritis 2013, 2014   bilateral s/p B TKR Noemi [facility])  . Knee osteoarthritis   . 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 Migraines   . Osteopenia 08/2016   hip -2.4, spine -1.4  . Post-operative nausea and vomiting   . Pre-diabetes   . Refusal of blood transfusions as patient is Jehovah's Witness   . Seasonal allergies    cats, dust, mold, roaches  . 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 Isac Sarna), R tibia - then recurrent  . Tubular adenoma of colon 08/2014  . Vitamin D deficiency     Assessment/Plan:   2 Days Post-Op Procedure(s) (LRB): CONVERSION TO TOTAL HIP ( REMOVING SMITH AND NEPHEW NAIL) (Right) Active Problems:   Status post total hip replacement, right  Estimated body mass index is 25.82 kg/m as calculated from the following:   Height as of this encounter: 5\' 4"  (1.626 m).   Weight as  of this encounter: 68.2 kg. Advance diet Up with therapy   Labs reviewed this AM. Temp 99.6 last night, WBC 10.1, encouraged incentive spirometer. Up with therapy today. Begin working on BM. Plan is for discharge to SNF tomorrow.  DVT Prophylaxis - Aspirin, TED hose and SCDs Weight-Bearing as tolerated to right leg  J. Cameron Proud, PA-C [place] 12/09/2017, 8:00 AM

## 2017-12-09 NOTE — Progress Notes (Signed)
Physical Therapy Treatment Patient Details Name: Kristin Kramer MRN: [id number] DOB: [birth year] Today's Date: 12/09/2017    History of Present Illness Pt is a 77 y.o. 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  PMH includes B TKR.    PT Comments    Pt ready to walk again this am.  Has been walking to bathroom with nursing this am.  Stood and was able to complete full lap around unit x 1 with walker and min guard.  Varied step pattern but progressing more to step-though pattern. No LOB or buckling.  Pt's family in.  Discussed discharge plan with pt and son.  She seems to have a good support system at home and help available.    Will keep SNF recommendation at this point but anticipate change to home with HHPT and assist for mobility if current progress continues tomorrow.  Pt to talk with family and friends to set up assistance at home as she states she would prefer to go home if able vs SNF.   Follow Up Recommendations  SNF     Equipment Recommendations  Rolling walker with 5" wheels;3in1 (PT)    Recommendations for Other Services       Precautions / Restrictions Precautions Precautions: Fall;Posterior Hip Precaution Booklet Issued: Yes (comment) Precaution Comments: Wound vac Restrictions Weight Bearing Restrictions: Yes RLE Weight Bearing: Weight bearing as tolerated    Mobility  Bed Mobility               General bed mobility comments: not tested this date, pt up in chair at beginning/end of session  Transfers Overall transfer level: Needs assistance Equipment used: Rolling walker (2 wheeled) Transfers: Sit to/from Stand Sit to Stand: Min assist;Min guard Stand pivot transfers: Min guard       General transfer comment: not tested this date  Ambulation/Gait Ambulation/Gait assistance: Min guard Gait Distance (Feet): 220 Feet Assistive device: Rolling  walker (2 wheeled) Gait Pattern/deviations: Step-through pattern;Step-to pattern Gait velocity: decreased   General Gait Details: progressing to step-through during session.     Stairs             Wheelchair Mobility    Modified Rankin (Stroke Patients Only)       Balance Overall balance assessment: Needs assistance Sitting-balance support: No upper extremity supported;Feet supported Sitting balance-Leahy Scale: Good Sitting balance - Comments: occasionally uses single UE 2/2 to R hip pain   Standing balance support: Bilateral upper extremity supported Standing balance-Leahy Scale: Fair Standing balance comment: pt requiring B UE support for static standing balance                            Cognition Arousal/Alertness: Awake/alert Behavior During Therapy: WFL for tasks assessed/performed Overall Cognitive Status: Within Functional Limits for tasks assessed                                        Exercises Total Joint Exercises Ankle Circles/Pumps: AROM;Strengthening;Both;10 reps;Supine Quad Sets: AROM;Strengthening;Both;10 reps;Supine Gluteal Sets: AROM;Strengthening;Both;10 reps;Supine Towel Squeeze: AROM;Strengthening;Both;10 reps;Supine Short Arc Quad: AAROM;Strengthening;Right;10 reps;Supine Heel Slides: AAROM;Strengthening;Right;10 reps;Supine Hip ABduction/ADduction: AAROM;Strengthening;Right;10 reps;Supine    General Comments        Pertinent Vitals/Pain Pain Assessment: 0-10 Pain Score: 3  Faces Pain Scale: No hurt Pain  Location: reports pain controlled this date Pain Descriptors / Indicators: Sore;Guarding Pain Intervention(s): Premedicated before session;Ice applied;Monitored during session    Home Living                      Prior Function            PT Goals (current goals can now be found in the care plan section) Acute Rehab PT Goals Patient Stated Goal: to be independent again Progress towards PT  goals: Progressing toward goals    Frequency    BID      PT Plan Current plan remains appropriate    Co-evaluation              AM-PAC PT "6 Clicks" Daily Activity  Outcome Measure  Difficulty turning over in bed (including adjusting bedclothes, sheets and blankets)?: A Little Difficulty moving from lying on back to sitting on the side of the bed? : A Little Difficulty sitting down on and standing up from a chair with arms (e.g., wheelchair, bedside commode, etc,.)?: A Little Help needed moving to and from a bed to chair (including a wheelchair)?: A Little Help needed walking in hospital room?: A Little Help needed climbing 3-5 steps with a railing? : A Little 6 Click Score: 18    End of Session Equipment Utilized During Treatment: Gait belt Activity Tolerance: Patient tolerated treatment well Patient left: in chair;with chair alarm set;with family/visitor present Nurse Communication: Mobility status Pain - Right/Left: Right Pain - part of body: Hip     Time: 0881-1031 PT Time Calculation (min) (ACUTE ONLY): 23 min  Charges:  $Gait Training: 23-37 mins $Therapeutic Exercise: 8-22 mins                     Chesley Noon, PTA 12/09/17, 11:26 AM

## 2017-12-10 LAB — BASIC METABOLIC PANEL
Anion gap: 5 (ref 5–15)
BUN: 14 mg/dL (ref 8–23)
CHLORIDE: 100 mmol/L (ref 98–111)
CO2: 32 mmol/L (ref 22–32)
CREATININE: 0.58 mg/dL (ref 0.44–1.00)
Calcium: 8.3 mg/dL — ABNORMAL LOW (ref 8.9–10.3)
GFR calc Af Amer: >60 mL/min (ref >60)
Glucose, Bld: 126 mg/dL — ABNORMAL HIGH (ref 70–99)
Potassium: 3.7 mmol/L (ref 3.5–5.1)
SODIUM: 137 mmol/L (ref 135–145)

## 2017-12-10 LAB — CBC
HCT: 31 % — ABNORMAL LOW (ref 35.0–47.0)
HEMOGLOBIN: 10.4 g/dL — AB (ref 12.0–16.0)
MCH: 29.3 pg (ref 26.0–34.0)
MCHC: 33.6 g/dL (ref 32.0–36.0)
MCV: 87.3 fL (ref 80.0–100.0)
PLATELETS: 200 10*3/uL (ref 150–440)
RBC: 3.55 MIL/uL — ABNORMAL LOW (ref 3.80–5.20)
RDW: 14 % (ref 11.5–14.5)
WBC: 7.8 10*3/uL (ref 3.6–11.0)

## 2017-12-10 MED ORDER — BISACODYL 10 MG RE SUPP
10.0000 mg | RECTAL | Status: AC
  Administered 2017-12-10: 10 mg via RECTAL
  Filled 2017-12-10: qty 1

## 2017-12-10 MED ORDER — FLEET ENEMA 7-19 GM/118ML RE ENEM: 1.0000 | ENEMA | Freq: Every day | RECTAL | Status: DC | PRN

## 2017-12-10 MED ORDER — ONDANSETRON 8 MG PO TBDP: 8.0000 mg | ORAL_TABLET | Freq: Three times a day (TID) | ORAL | 1 refills | Status: DC | PRN

## 2017-12-10 NOTE — Care Management Note (Signed)
Case Management Note  Patient Details  Name: Kristin Kramer MRN: [id number] Date of Birth: 12-02-1940  Subjective/Objective:                  RNCM spoke with patient to discuss transition to home today. She agrees and has a plan in place. Patrick/son will stay today and tonight and several friends for the next 4 days/nights. PCP [name]. [facility]. Has walker and cane. She has used [facility] in the past and wishes to use them again.   Action/Plan:  Referral to [facility] with [facility]. No other RNCM needs.  Expected Discharge Date:  12/10/17               Expected Discharge Plan:     In-House Referral:     Discharge planning Services  CM Consult  Post Acute Care Choice:  Home Health Choice offered to:  Patient  DME Arranged:    DME Agency:     HH Arranged:  PT [place]:  [facility] ([facility])  Status of Service:  Completed, signed off  If discussed at [employer], dates discussed:    Additional Comments:  Marshell Garfinkel, RN 12/10/2017, 9:43 AM

## 2017-12-10 NOTE — Progress Notes (Signed)
Subjective: 3 Days Post-Op Procedure(s) (LRB): CONVERSION TO TOTAL HIP ( REMOVING SMITH AND NEPHEW NAIL) (Right) Patient reports pain as 4 on 0-10 scale.   Patient is well, and has had no acute complaints or problems.   Reports that her N/V is improved this AM. Current plan is for discharge home today with HHPT. Denies any CP, SOB, ABD pain. Patient has not had a BM at this time.  Objective: Vital signs in last 24 hours: Temp:  [98.6 F (37 C)-99.4 F (37.4 C)] 99.4 F (37.4 C) (10/06 0738) Pulse Rate:  [82-86] 85 (10/06 0738) Resp:  [15] 15 (10/06 0006) BP: (85-110)/(47-66) 110/66 (10/06 0738) SpO2:  [94 %-96 %] 96 % (10/06 0738)  Intake/Output from previous day: 10/05 0701 - 10/06 0700 In: 360 [P.O.:360] Out: -  Intake/Output this shift: No intake/output data recorded.  Recent Labs    12/08/17 0516 12/09/17 0416 12/10/17 0351  HGB 11.3* 11.3* 10.4*   Recent Labs    12/09/17 0416 12/10/17 0351  WBC 10.1 7.8  RBC 3.91 3.55*  HCT 33.7* 31.0*  PLT 174 200   Recent Labs    12/09/17 0416 12/10/17 0351  NA 139 137  K 3.6 3.7  CL 104 100  CO2 28 32  BUN 10 14  CREATININE 0.43* 0.58  GLUCOSE 129* 126*  CALCIUM 8.4* 8.3*   No results for input(s): LABPT, INR in the last 72 hours.  EXAM General - Patient is Alert, Appropriate and Oriented Extremity - Neurovascular intact Sensation intact distally Intact pulses distally Dorsiflexion/Plantar flexion intact No cellulitis present Compartment soft Dressing - dressing C/D/I and no drainage, wound VAC intact without drainage. Motor Function - intact, moving foot and toes well on exam.   Past Medical History:  Diagnosis Date  . Abdominal pain, other specified site 10.2014   [facility] ER - mesenteric adenitis vs sclerosing mesenteritis vs nonspecific lymphadenitis  . Abnormal ECG    a. left axis deviation;  b. 12/2011 Echo: EF 55-60%, no rwma, pasp 72mmHg.  . Arthritis   . Depression    prior on lexapro, celexa,  wellbutrin.  high cymbalta doses cause tremors  . GERD (gastroesophageal reflux disease)   . H/O seasonal allergies   . Herpes zoster 02/19/2014  . History of pneumonia   . History of UTI    12/2011 - Treatment began preoperatively with CIPRO 500mg  BID  . HLD (hyperlipidemia)    statin intolerance  . Knee osteoarthritis 2013, 2014   bilateral s/p B TKR Noemi [facility])  . Knee osteoarthritis   . 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 Migraines   . Osteopenia 08/2016   hip -2.4, spine -1.4  . Post-operative nausea and vomiting   . Pre-diabetes   . Refusal of blood transfusions as patient is Jehovah's Witness   . Seasonal allergies    cats, dust, mold, roaches  . 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 Isac Sarna), R tibia - then recurrent  . Tubular adenoma of colon 08/2014  . Vitamin D deficiency    Assessment/Plan:   3 Days Post-Op Procedure(s) (LRB): CONVERSION TO TOTAL HIP ( REMOVING SMITH AND NEPHEW NAIL) (Right) Active Problems:   Status post total hip replacement, right  Estimated body mass index is 25.82 kg/m as calculated from the following:   Height as of this encounter: 5\' 4"  (1.626 m).  Weight as of this encounter: 68.2 kg. Advance diet Up with therapy   Labs reviewed this AM. Temp 99.4 last night, WBC 7.8, encouraged incentive spirometer. Up with therapy today.  Plan is now for discharge home with HHPT at this time. Begin working on BM.  Move on to fleet enema at this time. Plan for discharge home today with HHPT.  DVT Prophylaxis - Aspirin, TED hose and SCDs Weight-Bearing as tolerated to right leg  J. Cameron Proud, PA-C [place] 12/10/2017, 9:12 AM

## 2017-12-10 NOTE — Clinical Social Work Note (Signed)
CSW discussed case with the patient's PA who informed the CSW that the patient will no longer need SNF level of care due to progress with pain and ambulation. Patient will discharge home with home health, instead. CSW has updated RNCM and [facility] of the change in disposition. The CSW is signing off. Please consult should needs arise.  Santiago Bumpers, MSW, Latanya Presser [phone]

## 2017-12-10 NOTE — Progress Notes (Signed)
Physical Therapy Treatment Patient Details Name: Kristin Kramer MRN: [id number] DOB: 01/10/1941 Today's Date: 12/10/2017    History of Present Illness Pt is a 77 y.o. 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  PMH includes B TKR.    PT Comments    Pt is pleasant and agreeable to PT. Pt able to recall hip precautions 3/3 multiple times t/o treatment. Pt tolerates increased reps with ther-ex without pain. She demonstrates improved ability to mobilize. She performs safe transfers without need for VC to maintain hip precautions. Pt is ambulating with improve gait reciprocal pattern, appropriate foot clearance, and inc heel strike with only supervision. Pt educated on car transfers, visual utilized. Pt has appropriate social support set up 24/7 to safely go home. D/t improved independence and social support updating d/c recommendation to [place].  Follow Up Recommendations  Home health PT     Equipment Recommendations  Rolling walker with 5" wheels;3in1 (PT)    Recommendations for Other Services       Precautions / Restrictions Precautions Precautions: Fall;Posterior Hip Precaution Booklet Issued: Yes (comment) Restrictions Weight Bearing Restrictions: Yes RLE Weight Bearing: Weight bearing as tolerated    Mobility  Bed Mobility               General bed mobility comments: Not tested this date, pt up in chair at beginning/end of session  Transfers Overall transfer level: Needs assistance Equipment used: Rolling walker (2 wheeled) Transfers: Sit to/from Stand Sit to Stand: Min guard         General transfer comment: Pt demonstrates ability to transfer maintaining hip precautions without VCs.  Ambulation/Gait Ambulation/Gait assistance: Supervision Gait Distance (Feet): 220 Feet Assistive device: Rolling walker (2 wheeled) Gait Pattern/deviations: Step-to  pattern;Step-through pattern Gait velocity: 10 ft in 13.1 sec (0.76 ft/sec) Gait velocity interpretation: <1.31 ft/sec, indicative of household ambulator General Gait Details: Pt initally ambulates with step-to-pattern d/t stiffness, but within 30 ft ambulates with reciprocal pattern with moderate R heel strike, and B good foot clearance. She demonstrates safe RW use t/o ambulation.   Stairs             Wheelchair Mobility    Modified Rankin (Stroke Patients Only)       Balance Overall balance assessment: Needs assistance Sitting-balance support: No upper extremity supported;Feet supported Sitting balance-Leahy Scale: Good     Standing balance support: Bilateral upper extremity supported Standing balance-Leahy Scale: Fair Standing balance comment: Uses RW for B UE support                            Cognition Arousal/Alertness: Awake/alert Behavior During Therapy: WFL for tasks assessed/performed Overall Cognitive Status: Within Functional Limits for tasks assessed                                 General Comments: Follows commands consistantly      Exercises Other Exercises Other Exercises: Seated in reclincer RLE AROM ankle pumps, glute sets, and SAQs. RLE AAROM (CGA) SLRs and hip ABD/ADD. All ther-ex performed 15 reps.    General Comments        Pertinent Vitals/Pain Pain Assessment: No/denies pain Pain Score: 0-No pain    Home Living  Prior Function            PT Goals (current goals can now be found in the care plan section) Acute Rehab PT Goals Patient Stated Goal: to be independent again PT Goal Formulation: With patient Time For Goal Achievement: 12/21/17 Potential to Achieve Goals: Good Progress towards PT goals: Progressing toward goals    Frequency    BID      PT Plan Discharge plan needs to be updated    Co-evaluation              AM-PAC PT "6 Clicks" Daily Activity   Outcome Measure  Difficulty turning over in bed (including adjusting bedclothes, sheets and blankets)?: A Little Difficulty moving from lying on back to sitting on the side of the bed? : A Little Difficulty sitting down on and standing up from a chair with arms (e.g., wheelchair, bedside commode, etc,.)?: A Little Help needed moving to and from a bed to chair (including a wheelchair)?: A Little Help needed walking in hospital room?: None Help needed climbing 3-5 steps with a railing? : A Little 6 Click Score: 19    End of Session Equipment Utilized During Treatment: Gait belt Activity Tolerance: Patient tolerated treatment well Patient left: in chair;with call bell/phone within reach;with chair alarm set;with SCD's reapplied Nurse Communication: Mobility status PT Visit Diagnosis: Other abnormalities of gait and mobility (R26.89);Muscle weakness (generalized) (M62.81);History of falling (Z91.81);Difficulty in walking, not elsewhere classified (R26.2);Pain     Time: 3235-5732 PT Time Calculation (min) (ACUTE ONLY): 27 min  Charges:                        Algis Downs, SPT   Algis Downs 12/10/2017, 10:23 AM

## 2017-12-12 DIAGNOSIS — M1991 Primary osteoarthritis, unspecified site: Secondary | ICD-10-CM | POA: Diagnosis not present

## 2017-12-12 DIAGNOSIS — Z96641 Presence of right artificial hip joint: Secondary | ICD-10-CM | POA: Diagnosis not present

## 2017-12-12 DIAGNOSIS — F329 Major depressive disorder, single episode, unspecified: Secondary | ICD-10-CM | POA: Diagnosis not present

## 2017-12-12 DIAGNOSIS — Z471 Aftercare following joint replacement surgery: Secondary | ICD-10-CM | POA: Diagnosis not present

## 2017-12-12 DIAGNOSIS — R7303 Prediabetes: Secondary | ICD-10-CM | POA: Diagnosis not present

## 2017-12-14 DIAGNOSIS — F329 Major depressive disorder, single episode, unspecified: Secondary | ICD-10-CM | POA: Diagnosis not present

## 2017-12-14 DIAGNOSIS — M1991 Primary osteoarthritis, unspecified site: Secondary | ICD-10-CM | POA: Diagnosis not present

## 2017-12-14 DIAGNOSIS — Z96641 Presence of right artificial hip joint: Secondary | ICD-10-CM | POA: Diagnosis not present

## 2017-12-14 DIAGNOSIS — Z471 Aftercare following joint replacement surgery: Secondary | ICD-10-CM | POA: Diagnosis not present

## 2017-12-14 DIAGNOSIS — R7303 Prediabetes: Secondary | ICD-10-CM | POA: Diagnosis not present

## 2017-12-15 DIAGNOSIS — Z96641 Presence of right artificial hip joint: Secondary | ICD-10-CM | POA: Diagnosis not present

## 2017-12-15 DIAGNOSIS — F329 Major depressive disorder, single episode, unspecified: Secondary | ICD-10-CM | POA: Diagnosis not present

## 2017-12-15 DIAGNOSIS — M1991 Primary osteoarthritis, unspecified site: Secondary | ICD-10-CM | POA: Diagnosis not present

## 2017-12-15 DIAGNOSIS — Z471 Aftercare following joint replacement surgery: Secondary | ICD-10-CM | POA: Diagnosis not present

## 2017-12-15 DIAGNOSIS — R7303 Prediabetes: Secondary | ICD-10-CM | POA: Diagnosis not present

## 2017-12-18 DIAGNOSIS — M1991 Primary osteoarthritis, unspecified site: Secondary | ICD-10-CM | POA: Diagnosis not present

## 2017-12-18 DIAGNOSIS — Z96641 Presence of right artificial hip joint: Secondary | ICD-10-CM | POA: Diagnosis not present

## 2017-12-18 DIAGNOSIS — Z471 Aftercare following joint replacement surgery: Secondary | ICD-10-CM | POA: Diagnosis not present

## 2017-12-18 DIAGNOSIS — F329 Major depressive disorder, single episode, unspecified: Secondary | ICD-10-CM | POA: Diagnosis not present

## 2017-12-18 DIAGNOSIS — R7303 Prediabetes: Secondary | ICD-10-CM | POA: Diagnosis not present

## 2017-12-19 LAB — SURGICAL PATHOLOGY

## 2017-12-20 DIAGNOSIS — M1991 Primary osteoarthritis, unspecified site: Secondary | ICD-10-CM | POA: Diagnosis not present

## 2017-12-20 DIAGNOSIS — Z96641 Presence of right artificial hip joint: Secondary | ICD-10-CM | POA: Diagnosis not present

## 2017-12-20 DIAGNOSIS — Z471 Aftercare following joint replacement surgery: Secondary | ICD-10-CM | POA: Diagnosis not present

## 2017-12-20 DIAGNOSIS — R7303 Prediabetes: Secondary | ICD-10-CM | POA: Diagnosis not present

## 2017-12-20 DIAGNOSIS — F329 Major depressive disorder, single episode, unspecified: Secondary | ICD-10-CM | POA: Diagnosis not present

## 2017-12-21 DIAGNOSIS — Z471 Aftercare following joint replacement surgery: Secondary | ICD-10-CM | POA: Diagnosis not present

## 2017-12-21 DIAGNOSIS — Z96641 Presence of right artificial hip joint: Secondary | ICD-10-CM | POA: Diagnosis not present

## 2017-12-21 DIAGNOSIS — F329 Major depressive disorder, single episode, unspecified: Secondary | ICD-10-CM | POA: Diagnosis not present

## 2017-12-21 DIAGNOSIS — M1991 Primary osteoarthritis, unspecified site: Secondary | ICD-10-CM | POA: Diagnosis not present

## 2017-12-21 DIAGNOSIS — R7303 Prediabetes: Secondary | ICD-10-CM | POA: Diagnosis not present

## 2017-12-26 DIAGNOSIS — F329 Major depressive disorder, single episode, unspecified: Secondary | ICD-10-CM | POA: Diagnosis not present

## 2017-12-26 DIAGNOSIS — Z471 Aftercare following joint replacement surgery: Secondary | ICD-10-CM | POA: Diagnosis not present

## 2017-12-26 DIAGNOSIS — R7303 Prediabetes: Secondary | ICD-10-CM | POA: Diagnosis not present

## 2017-12-26 DIAGNOSIS — Z96641 Presence of right artificial hip joint: Secondary | ICD-10-CM | POA: Diagnosis not present

## 2017-12-26 DIAGNOSIS — M1991 Primary osteoarthritis, unspecified site: Secondary | ICD-10-CM | POA: Diagnosis not present

## 2017-12-28 DIAGNOSIS — Z96641 Presence of right artificial hip joint: Secondary | ICD-10-CM | POA: Diagnosis not present

## 2017-12-28 DIAGNOSIS — R7303 Prediabetes: Secondary | ICD-10-CM | POA: Diagnosis not present

## 2017-12-28 DIAGNOSIS — M1991 Primary osteoarthritis, unspecified site: Secondary | ICD-10-CM | POA: Diagnosis not present

## 2017-12-28 DIAGNOSIS — F329 Major depressive disorder, single episode, unspecified: Secondary | ICD-10-CM | POA: Diagnosis not present

## 2017-12-28 DIAGNOSIS — Z471 Aftercare following joint replacement surgery: Secondary | ICD-10-CM | POA: Diagnosis not present

## 2018-01-01 DIAGNOSIS — R7303 Prediabetes: Secondary | ICD-10-CM | POA: Diagnosis not present

## 2018-01-01 DIAGNOSIS — F329 Major depressive disorder, single episode, unspecified: Secondary | ICD-10-CM | POA: Diagnosis not present

## 2018-01-01 DIAGNOSIS — M1991 Primary osteoarthritis, unspecified site: Secondary | ICD-10-CM | POA: Diagnosis not present

## 2018-01-01 DIAGNOSIS — Z471 Aftercare following joint replacement surgery: Secondary | ICD-10-CM | POA: Diagnosis not present

## 2018-01-01 DIAGNOSIS — Z96641 Presence of right artificial hip joint: Secondary | ICD-10-CM | POA: Diagnosis not present

## 2018-01-02 ENCOUNTER — Ambulatory Visit: Payer: Self-pay | Admitting: Gastroenterology

## 2018-01-04 DIAGNOSIS — R7303 Prediabetes: Secondary | ICD-10-CM | POA: Diagnosis not present

## 2018-01-04 DIAGNOSIS — F329 Major depressive disorder, single episode, unspecified: Secondary | ICD-10-CM | POA: Diagnosis not present

## 2018-01-04 DIAGNOSIS — Z471 Aftercare following joint replacement surgery: Secondary | ICD-10-CM | POA: Diagnosis not present

## 2018-01-04 DIAGNOSIS — Z96641 Presence of right artificial hip joint: Secondary | ICD-10-CM | POA: Diagnosis not present

## 2018-01-04 DIAGNOSIS — M1991 Primary osteoarthritis, unspecified site: Secondary | ICD-10-CM | POA: Diagnosis not present

## 2018-01-16 DIAGNOSIS — M25551 Pain in right hip: Secondary | ICD-10-CM | POA: Diagnosis not present

## 2018-01-16 DIAGNOSIS — R262 Difficulty in walking, not elsewhere classified: Secondary | ICD-10-CM | POA: Diagnosis not present

## 2018-01-16 DIAGNOSIS — M6281 Muscle weakness (generalized): Secondary | ICD-10-CM | POA: Diagnosis not present

## 2018-01-16 DIAGNOSIS — M25651 Stiffness of right hip, not elsewhere classified: Secondary | ICD-10-CM | POA: Diagnosis not present

## 2018-01-18 ENCOUNTER — Encounter: Payer: Self-pay | Admitting: Family Medicine

## 2018-01-18 DIAGNOSIS — Z471 Aftercare following joint replacement surgery: Secondary | ICD-10-CM | POA: Diagnosis not present

## 2018-01-18 DIAGNOSIS — M6281 Muscle weakness (generalized): Secondary | ICD-10-CM | POA: Diagnosis not present

## 2018-01-18 DIAGNOSIS — M25651 Stiffness of right hip, not elsewhere classified: Secondary | ICD-10-CM | POA: Diagnosis not present

## 2018-01-18 DIAGNOSIS — D485 Neoplasm of uncertain behavior of skin: Secondary | ICD-10-CM | POA: Diagnosis not present

## 2018-01-18 DIAGNOSIS — Z85828 Personal history of other malignant neoplasm of skin: Secondary | ICD-10-CM | POA: Diagnosis not present

## 2018-01-18 DIAGNOSIS — M25551 Pain in right hip: Secondary | ICD-10-CM | POA: Diagnosis not present

## 2018-01-18 DIAGNOSIS — C44729 Squamous cell carcinoma of skin of left lower limb, including hip: Secondary | ICD-10-CM | POA: Diagnosis not present

## 2018-01-19 DIAGNOSIS — M1611 Unilateral primary osteoarthritis, right hip: Secondary | ICD-10-CM | POA: Diagnosis not present

## 2018-01-25 ENCOUNTER — Encounter: Payer: Self-pay | Admitting: Family Medicine

## 2018-01-25 DIAGNOSIS — M6281 Muscle weakness (generalized): Secondary | ICD-10-CM | POA: Diagnosis not present

## 2018-01-25 DIAGNOSIS — R262 Difficulty in walking, not elsewhere classified: Secondary | ICD-10-CM | POA: Diagnosis not present

## 2018-01-25 DIAGNOSIS — Z471 Aftercare following joint replacement surgery: Secondary | ICD-10-CM | POA: Diagnosis not present

## 2018-01-25 DIAGNOSIS — M25551 Pain in right hip: Secondary | ICD-10-CM | POA: Diagnosis not present

## 2018-01-29 DIAGNOSIS — M25651 Stiffness of right hip, not elsewhere classified: Secondary | ICD-10-CM | POA: Diagnosis not present

## 2018-01-29 DIAGNOSIS — M6281 Muscle weakness (generalized): Secondary | ICD-10-CM | POA: Diagnosis not present

## 2018-01-29 DIAGNOSIS — R262 Difficulty in walking, not elsewhere classified: Secondary | ICD-10-CM | POA: Diagnosis not present

## 2018-01-29 DIAGNOSIS — M25551 Pain in right hip: Secondary | ICD-10-CM | POA: Diagnosis not present

## 2018-02-05 DIAGNOSIS — M25651 Stiffness of right hip, not elsewhere classified: Secondary | ICD-10-CM | POA: Diagnosis not present

## 2018-02-05 DIAGNOSIS — M6281 Muscle weakness (generalized): Secondary | ICD-10-CM | POA: Diagnosis not present

## 2018-02-05 DIAGNOSIS — R262 Difficulty in walking, not elsewhere classified: Secondary | ICD-10-CM | POA: Diagnosis not present

## 2018-02-05 DIAGNOSIS — M25551 Pain in right hip: Secondary | ICD-10-CM | POA: Diagnosis not present

## 2018-02-06 ENCOUNTER — Ambulatory Visit (INDEPENDENT_AMBULATORY_CARE_PROVIDER_SITE_OTHER): Payer: Medicare Other | Admitting: Gastroenterology

## 2018-02-06 ENCOUNTER — Encounter: Payer: Self-pay | Admitting: Gastroenterology

## 2018-02-06 VITALS — BP 118/76 | HR 70 | Ht 65.0 in | Wt 153.5 lb

## 2018-02-06 DIAGNOSIS — R1031 Right lower quadrant pain: Secondary | ICD-10-CM | POA: Diagnosis not present

## 2018-02-06 DIAGNOSIS — Z86010 Personal history of colonic polyps: Secondary | ICD-10-CM | POA: Diagnosis not present

## 2018-02-06 DIAGNOSIS — E739 Lactose intolerance, unspecified: Secondary | ICD-10-CM

## 2018-02-06 NOTE — Patient Instructions (Signed)
Follow up with Dr. Fuller Plan as needed.   Thank you for choosing me and [facility].  Pricilla Riffle. Dagoberto Ligas., MD., Marval Regal

## 2018-02-06 NOTE — Progress Notes (Signed)
    History of Present Illness: This is a 77 year old with diarrhea that has completely resolved with lactose avoidance.  She has occasional mild right lower quadrant pain is not related to bowel movements and improves with Tylenol and Advil.  Current Medications, Allergies, Past Medical History, Past Surgical History, Family History and Social History were reviewed in [employer] record.  Physical Exam: General: Well developed, well nourished, no acute distress Head: Normocephalic and atraumatic Eyes:  sclerae anicteric, EOMI Ears: Normal auditory acuity Mouth: No deformity or lesions Lungs: Clear throughout to auscultation Heart: Regular rate and rhythm; no murmurs, rubs or bruits Abdomen: Soft, non tender and non distended. No masses, hepatosplenomegaly or hernias noted. Normal Bowel sounds Rectal: Not done Musculoskeletal: Symmetrical with no gross deformities  Pulses:  Normal pulses noted Extremities: No clubbing, cyanosis, edema or deformities noted Neurological: Alert oriented x 4, grossly nonfocal Psychological:  Alert and cooperative. Normal mood and affect   Assessment and Recommendations:  1. Diarrhea, lactose intolerance.  Continue to avoid lactose or use Lactaid tablets as needed.  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  Continue Tylenol and Advil as directed.  3. Personal history adenomatous colon polyps.  5-year interval surveillance colonoscopy is recommended in June 2021.

## 2018-02-08 ENCOUNTER — Encounter: Payer: Self-pay | Admitting: Family Medicine

## 2018-02-08 ENCOUNTER — Ambulatory Visit (INDEPENDENT_AMBULATORY_CARE_PROVIDER_SITE_OTHER): Payer: Medicare Other | Admitting: Family Medicine

## 2018-02-08 VITALS — BP 120/72 | HR 71 | Temp 98.4°F | Ht 65.0 in | Wt 152.5 lb

## 2018-02-08 DIAGNOSIS — M26621 Arthralgia of right temporomandibular joint: Secondary | ICD-10-CM

## 2018-02-08 DIAGNOSIS — R413 Other amnesia: Secondary | ICD-10-CM | POA: Diagnosis not present

## 2018-02-08 DIAGNOSIS — S72141K Displaced intertrochanteric fracture of right femur, subsequent encounter for closed fracture with nonunion: Secondary | ICD-10-CM

## 2018-02-08 DIAGNOSIS — K654 Sclerosing mesenteritis: Secondary | ICD-10-CM | POA: Diagnosis not present

## 2018-02-08 DIAGNOSIS — E739 Lactose intolerance, unspecified: Secondary | ICD-10-CM

## 2018-02-08 DIAGNOSIS — F331 Major depressive disorder, recurrent, moderate: Secondary | ICD-10-CM

## 2018-02-08 HISTORY — DX: Arthralgia of right temporomandibular joint: M26.621

## 2018-02-08 HISTORY — DX: Lactose intolerance, unspecified: E73.9

## 2018-02-08 MED ORDER — CYCLOBENZAPRINE HCL 5 MG PO TABS: 5.0000 mg | ORAL_TABLET | Freq: Two times a day (BID) | ORAL | 1 refills | Status: DC | PRN

## 2018-02-08 NOTE — Patient Instructions (Addendum)
I'm glad hip is recovering! I think your jaw pain is coming from TMJ inflammation/dysfunction. Treat with flexeril muscle relaxant (with sedation precautions) and naproxen 500mg  tablets twice daily with meals for 5 days. Take with pepcid.  Try jaw stretching exercises if tolerated. Let me know if not improving Follow up with dentist.   Temporomandibular Joint Syndrome Temporomandibular joint (TMJ) syndrome is a condition that affects the joints between your jaw and your skull. The TMJs are located near your ears and allow your jaw to open and close. These joints and the nearby muscles are involved in all movements of the jaw. People with TMJ syndrome have pain in the area of these joints and muscles. Chewing, biting, or other movements of the jaw can be difficult or painful. TMJ syndrome can be caused by various things. In many cases, the condition is mild and goes away within a few weeks. For some people, the condition can become a long-term problem. What are the causes? Possible causes of TMJ syndrome include:  Grinding your teeth or clenching your jaw. Some people do this when they are under stress.  Arthritis.  Injury to the jaw.  Head or neck injury.  Teeth or dentures that are not aligned well.  In some cases, the cause of TMJ syndrome may not be known. What are the signs or symptoms? The most common symptom is an aching pain on the side of the head in the area of the TMJ. Other symptoms may include:  Pain when moving your jaw, such as when chewing or biting.  Being unable to open your jaw all the way.  Making a clicking sound when you open your mouth.  Headache.  Earache.  Neck or shoulder pain.  How is this diagnosed? Diagnosis can usually be made based on your symptoms, your medical history, and a physical exam. Your health care provider may check the range of motion of your jaw. Imaging tests, such as X-rays or an MRI, are sometimes done. You may need to see your  dentist to determine if your teeth and jaw are lined up correctly. How is this treated? TMJ syndrome often goes away on its own. If treatment is needed, the options may include:  Eating soft foods and applying ice or heat.  Medicines to relieve pain or inflammation.  Medicines to relax the muscles.  A splint, bite plate, or mouthpiece to prevent teeth grinding or jaw clenching.  Relaxation techniques or counseling to help reduce stress.  Transcutaneous electrical nerve stimulation (TENS). This helps to relieve pain by applying an electrical current through the skin.  Acupuncture. This is sometimes helpful to relieve pain.  Jaw surgery. This is rarely needed.  Follow these instructions at home:  Take medicines only as directed by your health care provider.  Eat a soft diet if you are having trouble chewing.  Apply ice to the painful area. ? Put ice in a plastic bag. ? Place a towel between your skin and the bag. ? Leave the ice on for 20 minutes, 2-3 times a day.  Apply a warm compress to the painful area as directed.  Massage your jaw area and perform any jaw stretching exercises as recommended by your health care provider.  If you were given a mouthpiece or bite plate, wear it as directed.  Avoid foods that require a lot of chewing. Do not chew gum.  Keep all follow-up visits as directed by your health care provider. This is important. Contact a health care provider  if:  You are having trouble eating.  You have new or worsening symptoms. Get help right away if:  Your jaw locks open or closed. This information is not intended to replace advice given to you by your health care provider. Make sure you discuss any questions you have with your health care provider. Document Released: 11/16/2000 Document Revised: 10/22/2015 Document Reviewed: 09/26/2013 Elsevier Interactive Patient Education  [employer].

## 2018-02-08 NOTE — Assessment & Plan Note (Signed)
Very mild. Feels aricept is effective - will continue.

## 2018-02-08 NOTE — Assessment & Plan Note (Signed)
Not consistent with ear or sinus or dental etiology. Anticipate symptoms coming from R TMJ pain/dysfunction. Will treat with continued aleve x 1 wk course, flexeril muscle relaxant, and start TMJ isometric exercises which were reviewed with patient. rec f/u with dentist (appt scheduled next week), update [place] if new or worsening symptoms despite treatment.

## 2018-02-08 NOTE — Assessment & Plan Note (Signed)
More recently situational, discussed to consider taper of sertraline.

## 2018-02-08 NOTE — Assessment & Plan Note (Signed)
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 Recovering well from this.

## 2018-02-08 NOTE — Assessment & Plan Note (Signed)
Doing better with lactaid.

## 2018-02-08 NOTE — Assessment & Plan Note (Signed)
Appreciate GI care. Has been recommended tylenol PRN

## 2018-02-08 NOTE — Progress Notes (Signed)
BP 120/72 (BP Location: Left Arm, Patient Position: Sitting, Cuff Size: Normal)   Pulse 71   Temp 98.4 F (36.9 C) (Oral)   Ht 5\' 5"  (1.651 m)   Wt 152 lb 8 oz (69.2 kg)   SpO2 98%   BMI 25.38 kg/m    CC: 6 mo f/u visit Subjective:    Patient ID: Kristin Kramer, female    DOB: 19-Mar-1940, 77 y.o.   MRN: [id number]  HPI: Kristin Kramer is a 77 y.o. female presenting on 02/08/2018 for Follow-up (Here for 6 mo f/u. ) and Ear Pain (C/o right ear pain for 2 wks and is worsening. Pain is radiating into the jaw causing limited ROM and problems eating. Tried Tylenol, barely helpful. )   2 wk h/o R earache. Progressively worsening.  Now with some trismus.  No fevers/chills. No hearing changes, tinnitus, drainage from ears. No cough, congestion, sinus pressure, no significant ST, minimal PNDrainage (attributed to allergies). She tried some aleve without benefit.  Uses mouthguard at night time for chronic tooth grinding. Denies h/o TMJ. Upcoming appt with dentist next week.   Has seen GI for lactose intolerance - avoiding dairy has helped. H/o chronic sclerosing mesenteritis.   She feels aricept has helped memory - doesn't lose things as much as she used to.   She recently had revision and complete R hip replacement after failed partial hip replacement after fracture (closed intertrochanteric fracture with nonunion). Second surgery done by Dr Rudene Christians, she had a very good experience.   Relevant past medical, surgical, family and social history reviewed and updated as indicated. Interim medical history since our last visit reviewed. Allergies and medications reviewed and updated. Outpatient Medications Prior to Visit  Medication Sig Dispense Refill  . acetaminophen (TYLENOL) 500 MG tablet Take 500 mg by mouth 2 (two) times daily as needed (for pain.).    Marland Kitchen Cholecalciferol (VITAMIN D-3) 5000 units TABS Take 5,000 Units by mouth daily.     . Coenzyme Q10 (COQ10 PO) Take by mouth daily.    Marland Kitchen  donepezil (ARICEPT) 5 MG tablet Take 1 tablet (5 mg total) by mouth daily. (Patient taking differently: Take 5 mg by mouth at bedtime. ) 30 tablet 6  . loratadine (CLARITIN) 10 MG tablet Take 1 tablet (10 mg total) by mouth daily.    . metroNIDAZOLE (METROCREAM) 0.75 % cream Apply 1 application topically 2 (two) times daily. Applied daily to patient face    . montelukast (SINGULAIR) 10 MG tablet TAKE 1 TABLET BY MOUTH EVERYDAY AT BEDTIME (Patient taking differently: Take 10 mg by mouth at bedtime. ) 30 tablet 11  . Omega-3 Fatty Acids (FISH OIL PO) Take 1,600 mg by mouth daily.    . ondansetron (ZOFRAN ODT) 8 MG disintegrating tablet Take 1 tablet (8 mg total) by mouth every 8 (eight) hours as needed for nausea or vomiting. 30 tablet 1  . Probiotic Product (PROBIOTIC PO) Take 1 capsule by mouth daily.     . sertraline (ZOLOFT) 25 MG tablet Take 25-37.5 mg by mouth See admin instructions. Take 1 tablet (25 mg) by mouth on one night then alternate with taking 1.5 tablets (37.5 mg) every other night    . Turmeric 500 MG CAPS Take 1 capsule by mouth daily.    . vitamin B-12 (CYANOCOBALAMIN) 1000 MCG tablet Take 1,000 mcg by mouth daily.    Marland Kitchen HYDROcodone-acetaminophen (NORCO/VICODIN) 5-325 MG tablet Take 1-2 tablets by mouth every 4 (four) hours as needed for  moderate pain (pain score 4-6). 30 tablet 0  . methocarbamol (ROBAXIN) 500 MG tablet Take 1 tablet (500 mg total) by mouth every 6 (six) hours as needed for muscle spasms.    . ranitidine (ZANTAC) 150 MG tablet Take 150 mg by mouth 2 (two) times daily as needed (acid reflux/indigestion).   12   No facility-administered medications prior to visit.      Per HPI unless specifically indicated in ROS section below Review of Systems     Objective:    BP 120/72 (BP Location: Left Arm, Patient Position: Sitting, Cuff Size: Normal)   Pulse 71   Temp 98.4 F (36.9 C) (Oral)   Ht 5\' 5"  (1.651 m)   Wt 152 lb 8 oz (69.2 kg)   SpO2 98%   BMI 25.38  kg/m   Wt Readings from Last 3 Encounters:  02/08/18 152 lb 8 oz (69.2 kg)  02/06/18 153 lb 8 oz (69.6 kg)  12/07/17 150 lb 6.4 oz (68.2 kg)    Physical Exam  Constitutional: She appears well-developed and well-nourished. No distress.  HENT:  Head: Normocephalic and atraumatic.  Right Ear: Hearing, tympanic membrane and ear canal normal.  Left Ear: Hearing, tympanic membrane, external ear and ear canal normal.  Nose: Right sinus exhibits no maxillary sinus tenderness and no frontal sinus tenderness. Left sinus exhibits no maxillary sinus tenderness and no frontal sinus tenderness.  Mouth/Throat: Uvula is midline, oropharynx is clear and moist and mucous membranes are normal. No oropharyngeal exudate, posterior oropharyngeal edema, posterior oropharyngeal erythema or tonsillar abscesses.  Tender to palpation posterior external ear as well as anterior ear, marked tenderness at TMJ on right, limited ability to open jaw on right, limited ROM at TMJ on right.  No pain to palpation of teeth. No stone at stensens' duct.  No pain at temples  Eyes: Pupils are equal, round, and reactive to light. Conjunctivae and EOM are normal. No scleral icterus.  Neck: Normal range of motion. Neck supple. No thyromegaly present.  Lymphadenopathy:    She has no cervical adenopathy.  Skin: Skin is warm and dry. No rash noted.  No vesicular rash  Psychiatric: She has a normal mood and affect.  Nursing note and vitals reviewed.      Assessment & Plan:   Problem List Items Addressed This Visit    TMJ tenderness, right - Primary    Not consistent with ear or sinus or dental etiology. Anticipate symptoms coming from R TMJ pain/dysfunction. Will treat with continued aleve x 1 wk course, flexeril muscle relaxant, and start TMJ isometric exercises which were reviewed with patient. rec f/u with dentist (appt scheduled next week), update [place] if new or worsening symptoms despite treatment.      Sclerosing mesenteritis  ([place])    Appreciate GI care. Has been recommended tylenol PRN      Memory deficit    Very mild. Feels aricept is effective - will continue.       MDD (major depressive disorder), recurrent episode, moderate ([place])    More recently situational, discussed to consider taper of sertraline.       Lactose intolerance    Doing better with lactaid.      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 Recovering well from this.           Meds ordered this encounter  Medications  . cyclobenzaprine (FLEXERIL) 5 MG tablet  Sig: Take 1-2 tablets (5-10 mg total) by mouth 2 (two) times daily as needed (TMJ pain).    Dispense:  30 tablet    Refill:  1   No orders of the defined types were placed in this encounter.   Follow up plan: No follow-ups on file.  [name], MD

## 2018-02-09 ENCOUNTER — Other Ambulatory Visit: Payer: Self-pay | Admitting: Family Medicine

## 2018-02-09 DIAGNOSIS — D225 Melanocytic nevi of trunk: Secondary | ICD-10-CM | POA: Diagnosis not present

## 2018-02-09 DIAGNOSIS — D1801 Hemangioma of skin and subcutaneous tissue: Secondary | ICD-10-CM | POA: Diagnosis not present

## 2018-02-09 DIAGNOSIS — C44619 Basal cell carcinoma of skin of left upper limb, including shoulder: Secondary | ICD-10-CM | POA: Diagnosis not present

## 2018-02-09 DIAGNOSIS — Z1231 Encounter for screening mammogram for malignant neoplasm of breast: Secondary | ICD-10-CM

## 2018-02-09 DIAGNOSIS — L570 Actinic keratosis: Secondary | ICD-10-CM | POA: Diagnosis not present

## 2018-02-09 DIAGNOSIS — L814 Other melanin hyperpigmentation: Secondary | ICD-10-CM | POA: Diagnosis not present

## 2018-02-09 DIAGNOSIS — L821 Other seborrheic keratosis: Secondary | ICD-10-CM | POA: Diagnosis not present

## 2018-02-09 DIAGNOSIS — D485 Neoplasm of uncertain behavior of skin: Secondary | ICD-10-CM | POA: Diagnosis not present

## 2018-02-09 DIAGNOSIS — Z85828 Personal history of other malignant neoplasm of skin: Secondary | ICD-10-CM | POA: Diagnosis not present

## 2018-02-09 DIAGNOSIS — C44519 Basal cell carcinoma of skin of other part of trunk: Secondary | ICD-10-CM | POA: Diagnosis not present

## 2018-02-12 DIAGNOSIS — M25651 Stiffness of right hip, not elsewhere classified: Secondary | ICD-10-CM | POA: Diagnosis not present

## 2018-02-12 DIAGNOSIS — M6281 Muscle weakness (generalized): Secondary | ICD-10-CM | POA: Diagnosis not present

## 2018-02-12 DIAGNOSIS — R262 Difficulty in walking, not elsewhere classified: Secondary | ICD-10-CM | POA: Diagnosis not present

## 2018-02-12 DIAGNOSIS — M25551 Pain in right hip: Secondary | ICD-10-CM | POA: Diagnosis not present

## 2018-02-21 ENCOUNTER — Other Ambulatory Visit: Payer: Self-pay | Admitting: Family Medicine

## 2018-02-21 NOTE — Telephone Encounter (Signed)
Sertraline Last rx:  07/13/17? Last OV:  02/08/18, f/u- discussed taper of sertraline Next OV:  08/15/18, CPE

## 2018-02-22 DIAGNOSIS — M25651 Stiffness of right hip, not elsewhere classified: Secondary | ICD-10-CM | POA: Diagnosis not present

## 2018-02-22 DIAGNOSIS — M6281 Muscle weakness (generalized): Secondary | ICD-10-CM | POA: Diagnosis not present

## 2018-02-22 DIAGNOSIS — R262 Difficulty in walking, not elsewhere classified: Secondary | ICD-10-CM | POA: Diagnosis not present

## 2018-02-22 DIAGNOSIS — M25551 Pain in right hip: Secondary | ICD-10-CM | POA: Diagnosis not present

## 2018-03-14 ENCOUNTER — Other Ambulatory Visit: Payer: Self-pay | Admitting: Family Medicine

## 2018-03-21 ENCOUNTER — Ambulatory Visit
Admission: RE | Admit: 2018-03-21 | Discharge: 2018-03-21 | Disposition: A | Discharge status: Home / Self Care | Payer: Medicare Other | Source: Ambulatory Visit | Attending: Family Medicine | Admitting: Family Medicine

## 2018-03-21 DIAGNOSIS — Z1231 Encounter for screening mammogram for malignant neoplasm of breast: Secondary | ICD-10-CM

## 2018-03-22 ENCOUNTER — Other Ambulatory Visit: Payer: Self-pay | Admitting: Family Medicine

## 2018-03-22 DIAGNOSIS — R928 Other abnormal and inconclusive findings on diagnostic imaging of breast: Secondary | ICD-10-CM

## 2018-03-26 ENCOUNTER — Encounter: Payer: Self-pay | Admitting: Family Medicine

## 2018-03-26 ENCOUNTER — Ambulatory Visit: Payer: Medicare Other

## 2018-03-26 ENCOUNTER — Ambulatory Visit
Admission: RE | Admit: 2018-03-26 | Discharge: 2018-03-26 | Disposition: A | Discharge status: Home / Self Care | Payer: Medicare Other | Source: Ambulatory Visit | Attending: Family Medicine | Admitting: Family Medicine

## 2018-03-26 DIAGNOSIS — R928 Other abnormal and inconclusive findings on diagnostic imaging of breast: Secondary | ICD-10-CM

## 2018-03-26 LAB — HM MAMMOGRAPHY

## 2018-05-28 DIAGNOSIS — Z96641 Presence of right artificial hip joint: Secondary | ICD-10-CM | POA: Diagnosis not present

## 2018-05-28 DIAGNOSIS — S72141K Displaced intertrochanteric fracture of right femur, subsequent encounter for closed fracture with nonunion: Secondary | ICD-10-CM | POA: Diagnosis not present

## 2018-07-19 DIAGNOSIS — L570 Actinic keratosis: Secondary | ICD-10-CM | POA: Diagnosis not present

## 2018-07-19 DIAGNOSIS — Z85828 Personal history of other malignant neoplasm of skin: Secondary | ICD-10-CM | POA: Diagnosis not present

## 2018-07-19 DIAGNOSIS — D485 Neoplasm of uncertain behavior of skin: Secondary | ICD-10-CM | POA: Diagnosis not present

## 2018-07-19 DIAGNOSIS — C44729 Squamous cell carcinoma of skin of left lower limb, including hip: Secondary | ICD-10-CM | POA: Diagnosis not present

## 2018-07-27 ENCOUNTER — Encounter: Payer: Self-pay | Admitting: Family Medicine

## 2018-08-03 ENCOUNTER — Other Ambulatory Visit: Payer: Self-pay | Admitting: Family Medicine

## 2018-08-14 ENCOUNTER — Other Ambulatory Visit (INDEPENDENT_AMBULATORY_CARE_PROVIDER_SITE_OTHER): Payer: Medicare Other

## 2018-08-14 ENCOUNTER — Other Ambulatory Visit: Payer: Self-pay | Admitting: Family Medicine

## 2018-08-14 DIAGNOSIS — E559 Vitamin D deficiency, unspecified: Secondary | ICD-10-CM | POA: Diagnosis not present

## 2018-08-14 DIAGNOSIS — R7401 Elevation of levels of liver transaminase levels: Secondary | ICD-10-CM

## 2018-08-14 DIAGNOSIS — E785 Hyperlipidemia, unspecified: Secondary | ICD-10-CM

## 2018-08-14 DIAGNOSIS — K654 Sclerosing mesenteritis: Secondary | ICD-10-CM

## 2018-08-14 LAB — CBC WITH DIFFERENTIAL/PLATELET
Basophils Absolute: 0 10*3/uL (ref 0.0–0.1)
Basophils Relative: 0.5 % (ref 0.0–3.0)
Eosinophils Absolute: 0.1 10*3/uL (ref 0.0–0.7)
Eosinophils Relative: 2.9 % (ref 0.0–5.0)
HCT: 42.4 % (ref 36.0–46.0)
Hemoglobin: 14.1 g/dL (ref 12.0–15.0)
Lymphocytes Relative: 28.3 % (ref 12.0–46.0)
Lymphs Abs: 1.4 10*3/uL (ref 0.7–4.0)
MCHC: 33.2 g/dL (ref 30.0–36.0)
MCV: 87.3 fl (ref 78.0–100.0)
Monocytes Absolute: 0.5 10*3/uL (ref 0.1–1.0)
Monocytes Relative: 10.1 % (ref 3.0–12.0)
Neutro Abs: 3 10*3/uL (ref 1.4–7.7)
Neutrophils Relative %: 58.2 % (ref 43.0–77.0)
Platelets: 239 10*3/uL (ref 150.0–400.0)
RBC: 4.86 Mil/uL (ref 3.87–5.11)
RDW: 13.2 % (ref 11.5–15.5)
WBC: 5.1 10*3/uL (ref 4.0–10.5)

## 2018-08-14 LAB — COMPREHENSIVE METABOLIC PANEL
ALT: 13 U/L (ref 0–35)
AST: 19 U/L (ref 0–37)
Albumin: 4.4 g/dL (ref 3.5–5.2)
Alkaline Phosphatase: 122 U/L — ABNORMAL HIGH (ref 39–117)
BUN: 21 mg/dL (ref 6–23)
CO2: 26 mEq/L (ref 19–32)
Calcium: 9.4 mg/dL (ref 8.4–10.5)
Chloride: 103 mEq/L (ref 96–112)
Creatinine, Ser: 0.71 mg/dL (ref 0.40–1.20)
GFR: 79.72 mL/min (ref >60.00)
Glucose, Bld: 92 mg/dL (ref 70–99)
Potassium: 4.3 mEq/L (ref 3.5–5.1)
Sodium: 139 mEq/L (ref 135–145)
Total Bilirubin: 0.6 mg/dL (ref 0.2–1.2)
Total Protein: 6.9 g/dL (ref 6.0–8.3)

## 2018-08-14 LAB — LIPID PANEL
Cholesterol: 263 mg/dL — ABNORMAL HIGH (ref 0–200)
HDL: 63.3 mg/dL (ref >39.00)
LDL Cholesterol: 176 mg/dL — ABNORMAL HIGH (ref 0–99)
NonHDL: 199.81
Total CHOL/HDL Ratio: 4
Triglycerides: 121 mg/dL (ref 0.0–149.0)
VLDL: 24.2 mg/dL (ref 0.0–40.0)

## 2018-08-14 LAB — VITAMIN D 25 HYDROXY (VIT D DEFICIENCY, FRACTURES): VITD: 50.93 ng/mL (ref 30.00–100.00)

## 2018-08-14 LAB — TSH: TSH: 5.27 u[IU]/mL — ABNORMAL HIGH (ref 0.35–4.50)

## 2018-08-15 ENCOUNTER — Encounter: Payer: Self-pay | Admitting: Family Medicine

## 2018-08-15 ENCOUNTER — Ambulatory Visit (INDEPENDENT_AMBULATORY_CARE_PROVIDER_SITE_OTHER): Payer: Medicare Other | Admitting: Family Medicine

## 2018-08-15 ENCOUNTER — Ambulatory Visit (INDEPENDENT_AMBULATORY_CARE_PROVIDER_SITE_OTHER): Payer: Medicare Other

## 2018-08-15 VITALS — BP 116/68 | HR 72 | Temp 98.2°F | Ht 65.0 in | Wt 149.0 lb

## 2018-08-15 DIAGNOSIS — F331 Major depressive disorder, recurrent, moderate: Secondary | ICD-10-CM

## 2018-08-15 DIAGNOSIS — R413 Other amnesia: Secondary | ICD-10-CM | POA: Diagnosis not present

## 2018-08-15 DIAGNOSIS — R7989 Other specified abnormal findings of blood chemistry: Secondary | ICD-10-CM | POA: Diagnosis not present

## 2018-08-15 DIAGNOSIS — R252 Cramp and spasm: Secondary | ICD-10-CM | POA: Insufficient documentation

## 2018-08-15 DIAGNOSIS — E2839 Other primary ovarian failure: Secondary | ICD-10-CM

## 2018-08-15 DIAGNOSIS — R748 Abnormal levels of other serum enzymes: Secondary | ICD-10-CM

## 2018-08-15 DIAGNOSIS — E785 Hyperlipidemia, unspecified: Secondary | ICD-10-CM

## 2018-08-15 DIAGNOSIS — Z0000 Encounter for general adult medical examination without abnormal findings: Secondary | ICD-10-CM | POA: Diagnosis not present

## 2018-08-15 DIAGNOSIS — E038 Other specified hypothyroidism: Secondary | ICD-10-CM | POA: Insufficient documentation

## 2018-08-15 DIAGNOSIS — Z96641 Presence of right artificial hip joint: Secondary | ICD-10-CM | POA: Diagnosis not present

## 2018-08-15 DIAGNOSIS — Z531 Procedure and treatment not carried out because of patient's decision for reasons of belief and group pressure: Secondary | ICD-10-CM | POA: Diagnosis not present

## 2018-08-15 DIAGNOSIS — M8580 Other specified disorders of bone density and structure, unspecified site: Secondary | ICD-10-CM

## 2018-08-15 DIAGNOSIS — E039 Hypothyroidism, unspecified: Secondary | ICD-10-CM | POA: Insufficient documentation

## 2018-08-15 HISTORY — DX: Cramp and spasm: R25.2

## 2018-08-15 MED ORDER — FAMOTIDINE 20 MG PO TABS: 20.0000 mg | ORAL_TABLET | Freq: Every day | ORAL | Status: DC

## 2018-08-15 MED ORDER — DONEPEZIL HCL 5 MG PO TABS: 5.0000 mg | ORAL_TABLET | Freq: Every day | ORAL | 3 refills | Status: DC

## 2018-08-15 MED ORDER — MONTELUKAST SODIUM 10 MG PO TABS: 10.0000 mg | ORAL_TABLET | Freq: Every day | ORAL | 3 refills | Status: DC

## 2018-08-15 MED ORDER — EZETIMIBE 10 MG PO TABS: 10.0000 mg | ORAL_TABLET | Freq: Every day | ORAL | 3 refills | Status: DC

## 2018-08-15 MED ORDER — NIACINAMIDE 500 MG PO TABS: 500.0000 mg | ORAL_TABLET | Freq: Every day | ORAL | Status: AC

## 2018-08-15 NOTE — Assessment & Plan Note (Signed)
Planning to return to ortho for f/u of ongoing R leg discomfort.

## 2018-08-15 NOTE — Assessment & Plan Note (Addendum)
Borderline last check - will update DEXA.  She continues vit D 5000 IU daily

## 2018-08-15 NOTE — Assessment & Plan Note (Addendum)
Chronic on fish oil and niacin. Intolerant to statins and RYR. Agrees to trial zetia vs colestipol.  The 10-year ASCVD risk score Mikey Bussing DC Brooke Bonito., et al., 2013) is: 16.8%   Values used to calculate the score:     Age: 78 years     Sex: Female     Is Non-Hispanic African American: No     Diabetic: No     Tobacco smoker: No     Systolic Blood Pressure: 111 mmHg     Is BP treated: No     HDL Cholesterol: 63.3 mg/dL     Total Cholesterol: 263 mg/dL

## 2018-08-15 NOTE — Progress Notes (Signed)
PCP notes:   Health maintenance:  No gaps identified.  Abnormal screenings:   Fall risk - hx of single fall Fall Risk  08/15/2018 08/09/2017 09/23/2016 08/05/2016 08/04/2015  Falls in the past year? 1 Yes Yes Yes No  Comment fell in parking lot; fractures to ribs and stitches to face - [facility] Telephone Survey: data to providers prior to load pt reports 2 falls; both with injury and 1 with medical treatment -  Number falls in past yr: 0 1 2 or more 2 or more -  Comment - - [facility] Telephone Survey Actual Response = 20 - -  Injury with Fall? 1 Yes Yes Yes -    Patient concerns:   None  Nurse concerns:  None  Next PCP appt:   01/17/19 @ 1130

## 2018-08-15 NOTE — Progress Notes (Signed)
Virtual visit completed through [employer]. Due to national recommendations of social distancing due to COVID-19, a virtual visit is felt to be most appropriate for this patient at this time. Reviewed limitations of a virtual visit.   Patient location: home Provider location: [place] at [facility], office If any vitals were documented, they were collected by patient at home unless specified below.    BP 116/68   Pulse 72   Temp 98.2 F (36.8 C) (Oral)   Ht 5\' 5"  (1.651 m)   Wt 149 lb (67.6 kg)   BMI 24.79 kg/m    CC: AMW f/u visit Subjective:    Patient ID: Kristin Kramer, female    DOB: [birth year], 78 y.o.   MRN: [id number]  HPI: Kristin Kramer is a 78 y.o. female presenting on 08/15/2018 for Annual Exam (Pt 2. )   Saw Katha Cabal last week for medicare wellness visit. Note reviewed.   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   R hip doing well - s/p revision and complete hip replacement 12/2017 Rudene Christians) after failed partial hip replacement with intertrochanteric fracture with nonunion. She was working with trainer but only had 1 session prior to [place] closures.   Noticing increasing night time cramping of legs and feet as well as some R ankle swelling. Noticing increasing dyspnea with exertion, without cough or chest pain.   Mild cognitive impairment - thinks memory is better on aricept.   Denies hypothyroid symptoms.   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 Well woman exam - 2012 by prior PCP. Hysterectomy for fibroids, ovaries remained. Menopause early 85s. Recent CT with normal adnexal region.  Mammogram Birads1 03/2018  DEXADate: 06/2008 T -1.4 spine and hip  DEXA 08/2016 -2.4 hip, -1.4 spine, rpt 2 yrs Flu shot yearly  Pneumovax 2013, prevnar 2016  Tdap 10/2014  zostavax - 02/2013  Shingrix - 11/2016, 10/2017 Advanced directives - reviewed and scanned 08/2017. Lists Marvis Terance Hart and [employer] as  HCPOA. Jehova's witness - no blood transfusions but would want minor fractions of blood discussed with her. Does not want life prolonged if hopeless situation.  Seat belt use discussed  Sunscreen use discussed. No changing moles on skin. Sees Dr Ubaldo Glassing. Non smoker  Alcohol - 0-2 drinks per day - more with pandemic  Dentist - Q6 months Eye exam - yearly   Lives alone. Brother lives nearby.  Occupation: retired, was Network engineer and housewife  Edu: 1 yr college  Activity: limited by knee osteoarthritis, [place] [employer] [place].  Diet: good water, fruits/vegetables daily      Relevant past medical, surgical, family and social history reviewed and updated as indicated. Interim medical history since our last visit reviewed. Allergies and medications reviewed and updated. Outpatient Medications Prior to Visit  Medication Sig Dispense Refill  . acetaminophen (TYLENOL) 500 MG tablet Take 500 mg by mouth 2 (two) times daily as needed (for pain.).    Marland Kitchen Cholecalciferol (VITAMIN D-3) 5000 units TABS Take 5,000 Units by mouth daily.     . Coenzyme Q10 (COQ10 PO) Take by mouth daily.    Marland Kitchen loratadine (CLARITIN) 10 MG tablet Take 1 tablet (10 mg total) by mouth daily.    . metroNIDAZOLE (METROCREAM) 0.75 % cream Apply 1 application topically 2 (two) times daily. Applied daily to patient face    . Omega-3 Fatty Acids (FISH OIL PO) Take 1,600 mg by mouth daily.    . ondansetron (ZOFRAN ODT)  8 MG disintegrating tablet Take 1 tablet (8 mg total) by mouth every 8 (eight) hours as needed for nausea or vomiting. 30 tablet 1  . Probiotic Product (PROBIOTIC PO) Take 1 capsule by mouth daily.     . sertraline (ZOLOFT) 25 MG tablet ALTERNATE 1 TABLET AND 1.5 TABLETS EVERY OTHER DAY 113 tablet 3  . Turmeric 500 MG CAPS Take 1 capsule by mouth daily.    . vitamin B-12 (CYANOCOBALAMIN) 1000 MCG tablet Take 1,000 mcg by mouth daily.    . cyclobenzaprine (FLEXERIL) 5 MG tablet Take 1-2 tablets (5-10 mg total) by mouth 2  (two) times daily as needed (TMJ pain). 30 tablet 1  . donepezil (ARICEPT) 5 MG tablet TAKE 1 TABLET BY MOUTH EVERY DAY 90 tablet 1  . montelukast (SINGULAIR) 10 MG tablet TAKE 1 TABLET BY MOUTH EVERYDAY AT BEDTIME 90 tablet 0   No facility-administered medications prior to visit.      Per HPI unless specifically indicated in ROS section below Review of Systems Objective:    BP 116/68   Pulse 72   Temp 98.2 F (36.8 C) (Oral)   Ht 5\' 5"  (1.651 m)   Wt 149 lb (67.6 kg)   BMI 24.79 kg/m   Wt Readings from Last 3 Encounters:  08/15/18 149 lb (67.6 kg)  02/08/18 152 lb 8 oz (69.2 kg)  02/06/18 153 lb 8 oz (69.6 kg)     Physical exam: Gen: alert, NAD, not ill appearing Pulm: speaks in complete sentences without increased work of breathing Psych: normal mood, normal thought content      Results for orders placed or performed in visit on 08/14/18  CBC with Differential/Platelet  Result Value Ref Range   WBC 5.1 4.0 - 10.5 K/uL   RBC 4.86 3.87 - 5.11 Mil/uL   Hemoglobin 14.1 12.0 - 15.0 g/dL   HCT 42.4 36.0 - 46.0 %   MCV 87.3 78.0 - 100.0 fl   MCHC 33.2 30.0 - 36.0 g/dL   RDW 13.2 11.5 - 15.5 %   Platelets 239.0 150.0 - 400.0 K/uL   Neutrophils Relative % 58.2 43.0 - 77.0 %   Lymphocytes Relative 28.3 12.0 - 46.0 %   Monocytes Relative 10.1 3.0 - 12.0 %   Eosinophils Relative 2.9 0.0 - 5.0 %   Basophils Relative 0.5 0.0 - 3.0 %   Neutro Abs 3.0 1.4 - 7.7 K/uL   Lymphs Abs 1.4 0.7 - 4.0 K/uL   Monocytes Absolute 0.5 0.1 - 1.0 K/uL   Eosinophils Absolute 0.1 0.0 - 0.7 K/uL   Basophils Absolute 0.0 0.0 - 0.1 K/uL  TSH  Result Value Ref Range   TSH 5.27 (H) 0.35 - 4.50 uIU/mL  VITAMIN D 25 Hydroxy (Vit-D Deficiency, Fractures)  Result Value Ref Range   VITD 50.93 30.00 - 100.00 ng/mL  Comprehensive metabolic panel  Result Value Ref Range   Sodium 139 135 - 145 mEq/L   Potassium 4.3 3.5 - 5.1 mEq/L   Chloride 103 96 - 112 mEq/L   CO2 26 19 - 32 mEq/L   Glucose, Bld  92 70 - 99 mg/dL   BUN 21 6 - 23 mg/dL   Creatinine, Ser 0.71 0.40 - 1.20 mg/dL   Total Bilirubin 0.6 0.2 - 1.2 mg/dL   Alkaline Phosphatase 122 (H) 39 - 117 U/L   AST 19 0 - 37 U/L   ALT 13 0 - 35 U/L   Total Protein 6.9 6.0 - 8.3 g/dL  Albumin 4.4 3.5 - 5.2 g/dL   Calcium 9.4 8.4 - 10.5 mg/dL   GFR 79.72 >60.00 mL/min  Lipid panel  Result Value Ref Range   Cholesterol 263 (H) 0 - 200 mg/dL   Triglycerides 121.0 0.0 - 149.0 mg/dL   HDL 63.30 >39.00 mg/dL   VLDL 24.2 0.0 - 40.0 mg/dL   LDL Cholesterol 176 (H) 0 - 99 mg/dL   Total CHOL/HDL Ratio 4    NonHDL 199.81    Assessment & Plan:   Problem List Items Addressed This Visit    Status post total hip replacement, right    Planning to return to ortho for f/u of ongoing R leg discomfort.       Refusal of blood transfusions as patient is Jehovah's Witness   Osteopenia    Borderline last check - will update DEXA.  She continues vit D 5000 IU daily      Relevant Orders   DG Bone Density   Memory deficit    Mild, not affecting activities. She feels better on low dose aricept - will continue.       MDD (major depressive disorder), recurrent episode, moderate (HCC)    Stable period on sertraline. Continue.       Leg cramps    Labs stable. Will recheck thyroid when she returns. Discussed water intake, daily leg stretches before bedtime.       HLD (hyperlipidemia) - Primary    Chronic on fish oil and niacin. Intolerant to statins and RYR. Agrees to trial zetia vs colestipol.  The 10-year ASCVD risk score Mikey Bussing DC Brooke Bonito., et al., 2013) is: 16.8%   Values used to calculate the score:     Age: 26 years     Sex: Female     Is Non-Hispanic African American: No     Diabetic: No     Tobacco smoker: No     Systolic Blood Pressure: 182 mmHg     Is BP treated: No     HDL Cholesterol: 63.3 mg/dL     Total Cholesterol: 263 mg/dL       Relevant Medications   ezetimibe (ZETIA) 10 MG tablet   Other Relevant Orders   Lipid panel    Comprehensive metabolic panel   TSH   T4, free   Elevated alkaline phosphatase level    Mild. Continue to monitor       Abnormal TSH    Denies hypothyroid symptoms. Will continue to monitor.       Relevant Orders   TSH   T4, free    Other Visit Diagnoses    Estrogen deficiency       Relevant Orders   DG Bone Density       Meds ordered this encounter  Medications  . donepezil (ARICEPT) 5 MG tablet    Sig: Take 1 tablet (5 mg total) by mouth daily.    Dispense:  90 tablet    Refill:  3  . montelukast (SINGULAIR) 10 MG tablet    Sig: Take 1 tablet (10 mg total) by mouth at bedtime.    Dispense:  90 tablet    Refill:  3  . famotidine (PEPCID) 20 MG tablet    Sig: Take 1 tablet (20 mg total) by mouth at bedtime.  . niacinamide 500 MG tablet    Sig: Take 1 tablet (500 mg total) by mouth daily.  Marland Kitchen ezetimibe (ZETIA) 10 MG tablet    Sig: Take 1 tablet (10 mg total) by  mouth daily.    Dispense:  30 tablet    Refill:  3   Orders Placed This Encounter  Procedures  . DG Bone Density    Standing Status:   Future    Standing Expiration Date:   10/15/2019    Order Specific Question:   Reason for Exam (SYMPTOM  OR DIAGNOSIS REQUIRED)    Answer:   f/u osteopenia    Order Specific Question:   Preferred imaging location?    Answer:   [facility]  . Lipid panel    Standing Status:   Future    Standing Expiration Date:   08/15/2019  . Comprehensive metabolic panel    Standing Status:   Future    Standing Expiration Date:   08/15/2019  . TSH    Standing Status:   Future    Standing Expiration Date:   08/15/2019  . T4, free    Standing Status:   Future    Standing Expiration Date:   08/15/2019    I discussed the assessment and treatment plan with the patient. The patient was provided an opportunity to ask questions and all were answered. The patient agreed with the plan and demonstrated an understanding of the instructions. The patient was advised to call back or seek an  in-person evaluation if the symptoms worsen or if the condition fails to improve as anticipated.  Follow up plan: Return in about 1 year (around 08/15/2019) for medicare wellness visit.  [name], MD

## 2018-08-15 NOTE — Patient Instructions (Signed)
Kristin Kramer , Thank you for taking time to come for your Medicare Wellness Visit. I appreciate your ongoing commitment to your health goals. Please review the following plan we discussed and let me know if I can assist you in the future.   These are the goals we discussed: Goals    . Patient Stated     Starting 08/15/2018, I will continue to take medications as prescribed.        This is a list of the screening recommended for you and due dates:  Health Maintenance  Topic Date Due  . Flu Shot  10/06/2018  . Mammogram  03/27/2019  . Colon Cancer Screening  08/28/2019  . DTaP/Tdap/Td vaccine (2 - Td) 10/21/2024  . Tetanus Vaccine  10/21/2024  . DEXA scan (bone density measurement)  Completed  . Pneumonia vaccines  Completed   Preventive Care for Adults  A healthy lifestyle and preventive care can promote health and wellness. Preventive health guidelines for adults include the following key practices.  . A routine yearly physical is a good way to check with your health care provider about your health and preventive screening. It is a chance to share any concerns and updates on your health and to receive a thorough exam.  . Visit your dentist for a routine exam and preventive care every 6 months. Brush your teeth twice a day and floss once a day. Good oral hygiene prevents tooth decay and gum disease.  . The frequency of eye exams is based on your age, health, family medical history, use  of contact lenses, and other factors. Follow your health care provider's recommendations for frequency of eye exams.  . Eat a healthy diet. Foods like vegetables, fruits, whole grains, low-fat dairy products, and lean protein foods contain the nutrients you need without too many calories. Decrease your intake of foods high in solid fats, added sugars, and salt. Eat the right amount of calories for you. Get information about a proper diet from your health care provider, if necessary.  . Regular  physical exercise is one of the most important things you can do for your health. Most adults should get at least 150 minutes of moderate-intensity exercise (any activity that increases your heart rate and causes you to sweat) each week. In addition, most adults need muscle-strengthening exercises on 2 or more days a week.  Silver Sneakers may be a benefit available to you. To determine eligibility, you may visit the website: www.silversneakers.com or contact program at (715)455-0105 Mon-Fri between 8AM-8PM.   . Maintain a healthy weight. The body mass index (BMI) is a screening tool to identify possible weight problems. It provides an estimate of body fat based on height and weight. Your health care provider can find your BMI and can help you achieve or maintain a healthy weight.   For adults 20 years and older: ? A BMI below 18.5 is considered underweight. ? A BMI of 18.5 to 24.9 is normal. ? A BMI of 25 to 29.9 is considered overweight. ? A BMI of 30 and above is considered obese.   . Maintain normal blood lipids and cholesterol levels by exercising and minimizing your intake of saturated fat. Eat a balanced diet with plenty of fruit and vegetables. Blood tests for lipids and cholesterol should begin at age 65 and be repeated every 5 years. If your lipid or cholesterol levels are high, you are over 50, or you are at high risk for heart disease, you may need your  cholesterol levels checked more frequently. Ongoing high lipid and cholesterol levels should be treated with medicines if diet and exercise are not working.  . If you smoke, find out from your health care provider how to quit. If you do not use tobacco, please do not start.  . If you choose to drink alcohol, please do not consume more than 2 drinks per day. One drink is considered to be 12 ounces (355 mL) of beer, 5 ounces (148 mL) of wine, or 1.5 ounces (44 mL) of liquor.  . If you are 41-64 years old, ask your health care provider if  you should take aspirin to prevent strokes.  . Use sunscreen. Apply sunscreen liberally and repeatedly throughout the day. You should seek shade when your shadow is shorter than you. Protect yourself by wearing long sleeves, pants, a wide-brimmed hat, and sunglasses year round, whenever you are outdoors.  . Once a month, do a whole body skin exam, using a mirror to look at the skin on your back. Tell your health care provider of new moles, moles that have irregular borders, moles that are larger than a pencil eraser, or moles that have changed in shape or color.

## 2018-08-15 NOTE — Assessment & Plan Note (Signed)
Mild, not affecting activities. She feels better on low dose aricept - will continue.

## 2018-08-15 NOTE — Assessment & Plan Note (Addendum)
Mild. Continue to monitor. 

## 2018-08-15 NOTE — Assessment & Plan Note (Signed)
Stable period on sertraline. Continue.

## 2018-08-15 NOTE — Assessment & Plan Note (Signed)
Labs stable. Will recheck thyroid when she returns. Discussed water intake, daily leg stretches before bedtime.

## 2018-08-15 NOTE — Progress Notes (Signed)
Subjective:   Kristin Kramer is a 78 y.o. female who presents for Medicare Annual (Subsequent) preventive examination.  Review of Systems:  N/A Cardiac Risk Factors include: advanced age (>72men, >64 women);dyslipidemia     Objective:     Vitals: There were no vitals taken for this visit.  There is no height or weight on file to calculate BMI.  Advanced Directives 08/15/2018 12/07/2017 12/07/2017 12/05/2017 08/09/2017 07/14/2017 08/05/2016  Does Patient Have a Medical Advance Directive? Yes Yes Yes Yes Yes No Yes  Type of Paramedic of [place];Living will [place];Living will Healthcare Power of [place];Living will [place];Living will - [place];Living will  Does patient want to make changes to medical advance directive? No - Patient declined No - Patient declined No - Patient declined No - Patient declined - - -  Copy of [place] in Chart? No - copy requested No - copy requested Yes No - copy requested Yes - Yes    Tobacco Social History   Tobacco Use  Smoking Status Never Smoker  Smokeless Tobacco Never Used     Counseling given: No   Clinical Intake:  Pre-visit preparation completed: Yes  Pain : No/denies pain Pain Score: 0-No pain     Nutritional Status: BMI 25 -29 Overweight Nutritional Risks: None Diabetes: No  How often do you need to have someone help you when you read instructions, pamphlets, or other written materials from your doctor or pharmacy?: 1 - Never What is the last grade level you completed in school?: 12th grade + 1 yr college  Interpreter Needed?: No  Comments: pt is a widow and lives alone Information entered by :: LPinson, LPN  Past Medical History:  Diagnosis Date  . Abdominal pain, other specified site 10.2014   [facility] ER - mesenteric adenitis vs sclerosing mesenteritis vs nonspecific lymphadenitis  .  Abnormal ECG    a. left axis deviation;  b. 12/2011 Echo: EF 55-60%, no rwma, pasp 80mmHg.  . Arthritis   . Depression    prior on lexapro, celexa, wellbutrin.  high cymbalta doses cause tremors  . GERD (gastroesophageal reflux disease)   . H/O seasonal allergies   . Herpes zoster 02/19/2014  . History of pneumonia   . History of UTI    12/2011 - Treatment began preoperatively with CIPRO 500mg  BID  . HLD (hyperlipidemia)    statin intolerance  . Knee osteoarthritis 2013, 2014   bilateral s/p B TKR Noemi [facility])  . Knee osteoarthritis   . 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 Migraines   . Osteopenia 08/2016   hip -2.4, spine -1.4  . Post-operative nausea and vomiting   . Pre-diabetes   . Refusal of blood transfusions as patient is Jehovah's Witness   . Seasonal allergies    cats, dust, mold, roaches  . 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 Isac Sarna), R tibia - then recurrent, L tibia  . Tubular adenoma of colon 08/2014  . Vitamin D deficiency    Past Surgical History:  Procedure Laterality Date  . AUGMENTATION MAMMAPLASTY    . BREAST ENHANCEMENT SURGERY    . BUNIONECTOMY  2013   R and L foot  . CATARACT EXTRACTION  2013   R, pending L  . COLONOSCOPY  2007   no records received  . COLONOSCOPY  08/2014   tubular adenoma, rpt 5 yrs [facility])  . CONVERSION TO TOTAL HIP Right 12/07/2017   Procedure: CONVERSION TO TOTAL HIP ( REMOVING SMITH AND NEPHEW NAIL);  Surgeon: Hessie Knows, MD;  Location: [facility] ORS;  Service: Orthopedics;  Laterality: Right;  . COSMETIC SURGERY  2002   face lift  . dexa  06/2008   T -1.4 spine and hip  . ETHMOIDECTOMY Bilateral 05/19/2016   Procedure: ETHMOIDECTOMY;  Surgeon: Carloyn Manner, MD;  Location: [facility] ORS;  Service: ENT;  Laterality: Bilateral;  . EYE SURGERY     bilateral cataract surgery  . FRONTAL SINUS EXPLORATION Bilateral 05/19/2016    Procedure: FRONTAL SINUS EXPLORATION;  Surgeon: Carloyn Manner, MD;  Location: [facility] ORS;  Service: ENT;  Laterality: Bilateral;  . INTRAMEDULLARY (IM) NAIL INTERTROCHANTERIC Right 07/08/2017   Procedure: RIGHT INTERTROCHANTRIC IM NAIL;  Surgeon: Leandrew Koyanagi, MD;  Location: [facility];  Service: Orthopedics;  Laterality: Right;  . MAXILLARY ANTROSTOMY Bilateral 05/19/2016   Procedure: MAXILLARY ANTROSTOMY;  Surgeon: Carloyn Manner, MD;  Location: [facility] OR[facility];  Service: ENT;  Laterality: Bilateral;  . MOHS SURGERY  2011   R leg  . PARTIAL HYSTERECTOMY  1980   fibroids, ovaries remained  . SINUS ENDO W/FUSION N/A 05/19/2016   Procedure: ENDOSCOPIC SINUS SURGERY WITH NAVIGATION;  Surgeon: Carloyn Manner, MD;  Location: [facility];  Service: ENT;  Laterality: N/A;  . SPHENOIDECTOMY Bilateral 05/19/2016   Procedure: Coralee Pesa;  Surgeon: Carloyn Manner, MD;  Location: [facility] OR[facility];  Service: ENT;  Laterality: Bilateral;  . SQUAMOUS CELL CARCINOMA EXCISION Right 07/13/2016   Dr. Rolm Bookbinder, [facility]  . TONSILLECTOMY AND ADENOIDECTOMY  1945  . TOTAL KNEE ARTHROPLASTY  01/02/2012   Right - Surgeon: Lorn Junes, MD  . TOTAL KNEE ARTHROPLASTY Left 03/18/2013   Surgeon: Lorn Junes, MD   Family History  Problem Relation Age of Onset  . Cancer Mother 60       lung, smoker  . Dementia Mother 36  . Cancer Father        lung, smoker  . Cancer Maternal Grandmother 68       leukemia  . Coronary artery disease Paternal Grandmother 49       sudden cardiac death  . Diabetes Neg Hx   . Stroke Neg Hx   . Breast cancer Neg Hx    Social History   Socioeconomic History  . Marital status: Widowed    Spouse name: Not on file  . Number of children: 3  . Years of education: Not on file  . Highest education level: Not on file  Occupational History  . Occupation: Retired  Scientific laboratory technician  . Financial resource strain: Not on file  . Food insecurity:    Worry: Not on file    Inability:  Not on file  . Transportation needs:    Medical: Not on file    Non-medical: Not on file  Tobacco Use  . Smoking status: Never Smoker  . Smokeless tobacco: Never Used  Substance and Sexual Activity  . Alcohol use: Yes    Alcohol/week: 0.0 standard drinks    Comment: occasional // 5-6 drinks per month  . Drug use: No  . Sexual activity: Not Currently  Lifestyle  . Physical activity:    Days per week: Not on file    Minutes per session: Not on file  . Stress: Not on file  Relationships  . Social connections:    Talks on phone: Not on  file    Gets together: Not on file    Attends religious service: Not on file    Active member of club or organization: Not on file    Attends meetings of clubs or organizations: Not on file    Relationship status: Not on file  Other Topics Concern  . Not on file  Social History Narrative   Lives alone.  Brother lives nearby.   Occupation: retired, was Network engineer and housewife   Edu: 1 yr college   Activity: limited by knee   Diet: good water, fruits/vegetables daily   Religion: Jehova's witness    Outpatient Encounter Medications as of 08/15/2018  Medication Sig  . acetaminophen (TYLENOL) 500 MG tablet Take 500 mg by mouth 2 (two) times daily as needed (for pain.).  Marland Kitchen Cholecalciferol (VITAMIN D-3) 5000 units TABS Take 5,000 Units by mouth daily.   . Coenzyme Q10 (COQ10 PO) Take by mouth daily.  Marland Kitchen loratadine (CLARITIN) 10 MG tablet Take 1 tablet (10 mg total) by mouth daily.  . metroNIDAZOLE (METROCREAM) 0.75 % cream Apply 1 application topically 2 (two) times daily. Applied daily to patient face  . Omega-3 Fatty Acids (FISH OIL PO) Take 1,600 mg by mouth daily.  . ondansetron (ZOFRAN ODT) 8 MG disintegrating tablet Take 1 tablet (8 mg total) by mouth every 8 (eight) hours as needed for nausea or vomiting.  . Probiotic Product (PROBIOTIC PO) Take 1 capsule by mouth daily.   . sertraline (ZOLOFT) 25 MG tablet ALTERNATE 1 TABLET AND 1.5 TABLETS  EVERY OTHER DAY  . Turmeric 500 MG CAPS Take 1 capsule by mouth daily.  . vitamin B-12 (CYANOCOBALAMIN) 1000 MCG tablet Take 1,000 mcg by mouth daily.  . [DISCONTINUED] cyclobenzaprine (FLEXERIL) 5 MG tablet Take 1-2 tablets (5-10 mg total) by mouth 2 (two) times daily as needed (TMJ pain).  . [DISCONTINUED] donepezil (ARICEPT) 5 MG tablet TAKE 1 TABLET BY MOUTH EVERY DAY  . [DISCONTINUED] montelukast (SINGULAIR) 10 MG tablet TAKE 1 TABLET BY MOUTH EVERYDAY AT BEDTIME   No facility-administered encounter medications on file as of 08/15/2018.     Activities of Daily Living In your present state of health, do you have any difficulty performing the following activities: 08/15/2018 12/07/2017  Hearing? N -  Vision? Y -  Difficulty concentrating or making decisions? N -  Walking or climbing stairs? N -  Dressing or bathing? N -  Doing errands, shopping? N N  Preparing Food and eating ? N -  Using the Toilet? N -  In the past six months, have you accidently leaked urine? Y -  Do you have problems with loss of bowel control? N -  Managing your Medications? N -  Managing your Finances? N -  Housekeeping or managing your Housekeeping? N -  Some recent data might be hidden    Patient Care Team: [name], MD as PCP - General (Family Medicine) [name], [place] as Consulting Physician (Optometry)    Assessment:   This is a routine wellness examination for Kristin Kramer.  Vision Screening Comments: Vision exam in 2019 with Dr. Maryruth Hancock B.   Exercise Activities and Dietary recommendations Current Exercise Habits: Home exercise routine, Type of exercise: walking;strength training/weights, Time (Minutes): 60(75 minutes), Frequency (Times/Week): 3, Weekly Exercise (Minutes/Week): 180, Intensity: Mild, Exercise limited by: None identified  Goals    . Patient Stated     Starting 08/15/2018, I will continue to take medications as prescribed.        Fall Risk  Fall Risk  08/15/2018 08/09/2017  09/23/2016 08/05/2016 08/04/2015  Falls in the past year? 1 Yes Yes Yes No  Comment fell in parking lot; fractures to ribs and stitches to face - Emmi Telephone Survey: data to providers prior to load pt reports 2 falls; both with injury and 1 with medical treatment -  Number falls in past yr: 0 1 2 or more 2 or more -  Comment - - Emmi Telephone Survey Actual Response = 20 - -  Injury with Fall? 1 Yes Yes Yes -   Depression Screen PHQ 2/9 Scores 08/15/2018 08/09/2017 08/05/2016 08/04/2015  PHQ - 2 Score 0 0 3 2  PHQ- 9 Score 0 0 12 13     Cognitive Function MMSE - Mini Mental State Exam 08/15/2018 08/09/2017 08/05/2016  Orientation to time 5 5 5   Orientation to Place 5 5 5   Registration 3 3 3   Attention/ Calculation 0 0 0  Recall 3 3 3   Language- name 2 objects 0 0 0  Language- repeat 1 1 1   Language- follow 3 step command 0 3 2  Language- follow 3 step command-comments - - pt was unable to follow 1 step of 3 step command  Language- read & follow direction 0 0 0  Write a sentence 0 0 0  Copy design 0 0 0  Total score 17 20 19        PLEASE NOTE: A Mini-Cog screen was completed. Maximum score is 17. A value of 0 denotes this part of [place] MMSE was not completed or the patient failed this part of the Mini-Cog screening.   Mini-Cog Screening Orientation to Time - Max 5 pts Orientation to Place - Max 5 pts Registration - Max 3 pts Recall - Max 3 pts Language Repeat - Max 1 pts    Immunization History  Administered Date(s) Administered  . Influenza Split 12/01/2011  . Influenza, High Dose Seasonal PF 02/10/2014, 11/27/2017  . Influenza,inj,Quad PF,6+ Mos 01/14/2013, 12/29/2014, 02/04/2016  . Influenza-Unspecified 01/03/2017  . Pneumococcal Conjugate-13 07/31/2014  . Pneumococcal Polysaccharide-23 12/01/2011  . Tdap 10/22/2014  . Zoster 02/14/2013  . Zoster Recombinat (Shingrix) 11/29/2016, 10/21/2017   Screening Tests Health Maintenance  Topic Date Due  . INFLUENZA VACCINE   10/06/2018  . MAMMOGRAM  03/27/2019  . COLONOSCOPY  08/28/2019  . DTaP/Tdap/Td (2 - Td) 10/21/2024  . TETANUS/TDAP  10/21/2024  . DEXA SCAN  Completed  . PNA vac Low Risk Adult  Completed       Plan:     I have personally reviewed, addressed, and noted the following in the patient's chart:  A. Medical and social history B. Use of alcohol, tobacco or illicit drugs  C. Current medications and supplements D. Functional ability and status E.  Nutritional status F.  Physical activity G. Advance directives H. List of other physicians I.  Hospitalizations, surgeries, and ER visits in previous 12 months J.  Vitals (unless it is a telemedicine encounter) K. Screenings to include cognitive, depression, hearing, vision (NOTE: hearing and vision screenings not completed in telemedicine encounter) L. Referrals and appointments   In addition, I have reviewed and discussed with patient certain preventive protocols, quality metrics, and best practice recommendations. A written personalized care plan for preventive services and recommendations were provided to patient.  With patient's permission, we connected on 08/15/18 at 11:00 AM EDT. Interactive audio and video telecommunications were attempted with patient. This attempt was unsuccessful due to patient having technical difficulties OR patient did not  have access to video capability.  Encounter was completed with audio only.  Two patient identifiers were used to ensure the encounter occurred with the correct person. Patient was in home and writer was in office.   Signed,   Lindell Noe, MHA, BS, RN Health Coach

## 2018-08-15 NOTE — Assessment & Plan Note (Signed)
Denies hypothyroid symptoms. Will continue to monitor.  

## 2018-08-16 NOTE — Progress Notes (Signed)
I reviewed health advisor's note, was available for consultation, and agree with documentation and plan.  

## 2018-09-03 ENCOUNTER — Other Ambulatory Visit: Payer: Self-pay | Admitting: Orthopedic Surgery

## 2018-09-03 DIAGNOSIS — M8580 Other specified disorders of bone density and structure, unspecified site: Secondary | ICD-10-CM | POA: Insufficient documentation

## 2018-09-03 DIAGNOSIS — E785 Hyperlipidemia, unspecified: Secondary | ICD-10-CM

## 2018-09-03 DIAGNOSIS — T8484XA Pain due to internal orthopedic prosthetic devices, implants and grafts, initial encounter: Secondary | ICD-10-CM | POA: Diagnosis not present

## 2018-09-03 DIAGNOSIS — Z96649 Presence of unspecified artificial hip joint: Secondary | ICD-10-CM | POA: Diagnosis not present

## 2018-09-03 DIAGNOSIS — S72141K Displaced intertrochanteric fracture of right femur, subsequent encounter for closed fracture with nonunion: Secondary | ICD-10-CM | POA: Diagnosis not present

## 2018-09-03 DIAGNOSIS — E559 Vitamin D deficiency, unspecified: Secondary | ICD-10-CM | POA: Insufficient documentation

## 2018-09-03 DIAGNOSIS — Z96641 Presence of right artificial hip joint: Secondary | ICD-10-CM | POA: Diagnosis not present

## 2018-09-03 HISTORY — DX: Hyperlipidemia, unspecified: E78.5

## 2018-09-06 ENCOUNTER — Other Ambulatory Visit: Payer: Self-pay

## 2018-09-06 ENCOUNTER — Ambulatory Visit
Admission: RE | Admit: 2018-09-06 | Discharge: 2018-09-06 | Disposition: A | Discharge status: Home / Self Care | Payer: Medicare Other | Source: Ambulatory Visit | Attending: Family Medicine | Admitting: Family Medicine

## 2018-09-06 DIAGNOSIS — E2839 Other primary ovarian failure: Secondary | ICD-10-CM

## 2018-09-06 DIAGNOSIS — M8580 Other specified disorders of bone density and structure, unspecified site: Secondary | ICD-10-CM

## 2018-09-06 DIAGNOSIS — M8589 Other specified disorders of bone density and structure, multiple sites: Secondary | ICD-10-CM | POA: Diagnosis not present

## 2018-09-06 DIAGNOSIS — Z780 Asymptomatic menopausal state: Secondary | ICD-10-CM | POA: Diagnosis not present

## 2018-09-10 DIAGNOSIS — Z9842 Cataract extraction status, left eye: Secondary | ICD-10-CM | POA: Diagnosis not present

## 2018-09-10 DIAGNOSIS — H1852 Epithelial (juvenile) corneal dystrophy: Secondary | ICD-10-CM | POA: Diagnosis not present

## 2018-09-10 DIAGNOSIS — Z9841 Cataract extraction status, right eye: Secondary | ICD-10-CM | POA: Diagnosis not present

## 2018-09-10 DIAGNOSIS — H52223 Regular astigmatism, bilateral: Secondary | ICD-10-CM | POA: Diagnosis not present

## 2018-09-10 DIAGNOSIS — H1851 Endothelial corneal dystrophy: Secondary | ICD-10-CM | POA: Diagnosis not present

## 2018-09-10 DIAGNOSIS — H04123 Dry eye syndrome of bilateral lacrimal glands: Secondary | ICD-10-CM | POA: Diagnosis not present

## 2018-09-13 ENCOUNTER — Encounter
Admission: RE | Admit: 2018-09-13 | Discharge: 2018-09-13 | Disposition: A | Discharge status: Home / Self Care | Payer: Medicare Other | Source: Ambulatory Visit | Attending: Orthopedic Surgery | Admitting: Orthopedic Surgery

## 2018-09-13 ENCOUNTER — Other Ambulatory Visit: Payer: Self-pay

## 2018-09-13 DIAGNOSIS — M25551 Pain in right hip: Secondary | ICD-10-CM | POA: Diagnosis not present

## 2018-09-13 DIAGNOSIS — T8484XA Pain due to internal orthopedic prosthetic devices, implants and grafts, initial encounter: Secondary | ICD-10-CM | POA: Insufficient documentation

## 2018-09-13 DIAGNOSIS — Z96649 Presence of unspecified artificial hip joint: Secondary | ICD-10-CM | POA: Insufficient documentation

## 2018-09-13 DIAGNOSIS — Z96641 Presence of right artificial hip joint: Secondary | ICD-10-CM | POA: Diagnosis not present

## 2018-09-13 MED ORDER — TECHNETIUM TC 99M MEDRONATE IV KIT
23.6100 | PACK | Freq: Once | INTRAVENOUS | Status: AC | PRN
  Administered 2018-09-13: 23.61 via INTRAVENOUS

## 2018-11-08 ENCOUNTER — Other Ambulatory Visit: Payer: Self-pay | Admitting: Family Medicine

## 2018-11-28 DIAGNOSIS — Z23 Encounter for immunization: Secondary | ICD-10-CM | POA: Diagnosis not present

## 2018-11-30 ENCOUNTER — Other Ambulatory Visit: Payer: Self-pay | Admitting: Family Medicine

## 2018-12-12 ENCOUNTER — Encounter: Payer: Self-pay | Admitting: Family Medicine

## 2018-12-12 DIAGNOSIS — C44722 Squamous cell carcinoma of skin of right lower limb, including hip: Secondary | ICD-10-CM | POA: Diagnosis not present

## 2018-12-12 DIAGNOSIS — L821 Other seborrheic keratosis: Secondary | ICD-10-CM | POA: Diagnosis not present

## 2018-12-12 DIAGNOSIS — Z85828 Personal history of other malignant neoplasm of skin: Secondary | ICD-10-CM | POA: Diagnosis not present

## 2018-12-12 DIAGNOSIS — L570 Actinic keratosis: Secondary | ICD-10-CM | POA: Diagnosis not present

## 2018-12-12 DIAGNOSIS — D485 Neoplasm of uncertain behavior of skin: Secondary | ICD-10-CM | POA: Diagnosis not present

## 2018-12-12 DIAGNOSIS — L738 Other specified follicular disorders: Secondary | ICD-10-CM | POA: Diagnosis not present

## 2018-12-17 ENCOUNTER — Other Ambulatory Visit: Payer: Self-pay

## 2018-12-17 ENCOUNTER — Other Ambulatory Visit (INDEPENDENT_AMBULATORY_CARE_PROVIDER_SITE_OTHER): Payer: Medicare Other

## 2018-12-17 DIAGNOSIS — R7989 Other specified abnormal findings of blood chemistry: Secondary | ICD-10-CM

## 2018-12-17 DIAGNOSIS — E785 Hyperlipidemia, unspecified: Secondary | ICD-10-CM

## 2018-12-17 LAB — TSH: TSH: 5.52 u[IU]/mL — ABNORMAL HIGH (ref 0.35–4.50)

## 2018-12-17 LAB — COMPREHENSIVE METABOLIC PANEL
ALT: 29 U/L (ref 0–35)
AST: 36 U/L (ref 0–37)
Albumin: 4.3 g/dL (ref 3.5–5.2)
Alkaline Phosphatase: 128 U/L — ABNORMAL HIGH (ref 39–117)
BUN: 20 mg/dL (ref 6–23)
CO2: 30 mEq/L (ref 19–32)
Calcium: 9.5 mg/dL (ref 8.4–10.5)
Chloride: 103 mEq/L (ref 96–112)
Creatinine, Ser: 0.81 mg/dL (ref 0.40–1.20)
GFR: 68.41 mL/min (ref >60.00)
Glucose, Bld: 101 mg/dL — ABNORMAL HIGH (ref 70–99)
Potassium: 4.1 mEq/L (ref 3.5–5.1)
Sodium: 141 mEq/L (ref 135–145)
Total Bilirubin: 0.5 mg/dL (ref 0.2–1.2)
Total Protein: 6.8 g/dL (ref 6.0–8.3)

## 2018-12-17 LAB — LIPID PANEL
Cholesterol: 221 mg/dL — ABNORMAL HIGH (ref 0–200)
HDL: 58.3 mg/dL (ref >39.00)
LDL Cholesterol: 136 mg/dL — ABNORMAL HIGH (ref 0–99)
NonHDL: 163.09
Total CHOL/HDL Ratio: 4
Triglycerides: 137 mg/dL (ref 0.0–149.0)
VLDL: 27.4 mg/dL (ref 0.0–40.0)

## 2018-12-17 LAB — T4, FREE: Free T4: 0.66 ng/dL (ref 0.60–1.60)

## 2018-12-20 ENCOUNTER — Encounter: Payer: Self-pay | Admitting: Family Medicine

## 2018-12-24 ENCOUNTER — Telehealth: Payer: Self-pay

## 2018-12-24 NOTE — Telephone Encounter (Signed)
Released via mychart. plz notify: Your kidneys returned normal. Your sugar was ok. Your liver functions were largely normal.  Your thyroid function remains borderline elevated - let us know if any low thyroid symptoms (unexpected weight gain, cold intolerance, constipation, fatigue/tired) to consider starting thyroid replacement.  Your cholesterol levels are significantly improved compared to last check - continue zetia (ezetimibe) medication.

## 2018-12-24 NOTE — Telephone Encounter (Signed)
Left message on vm per dpr relaying results, message and instructions per Dr. Darnell Level.

## 2018-12-24 NOTE — Telephone Encounter (Signed)
Pt left v/m requesting cb with 12/17/18 lab results; pt cannot get into mychart.

## 2019-02-19 ENCOUNTER — Other Ambulatory Visit: Payer: Self-pay | Admitting: Family Medicine

## 2019-03-05 ENCOUNTER — Other Ambulatory Visit: Payer: Self-pay | Admitting: Family Medicine

## 2019-03-05 DIAGNOSIS — Z1231 Encounter for screening mammogram for malignant neoplasm of breast: Secondary | ICD-10-CM

## 2019-03-12 ENCOUNTER — Other Ambulatory Visit: Payer: Self-pay | Admitting: Family Medicine

## 2019-03-13 DIAGNOSIS — L089 Local infection of the skin and subcutaneous tissue, unspecified: Secondary | ICD-10-CM | POA: Diagnosis not present

## 2019-03-13 DIAGNOSIS — L0889 Other specified local infections of the skin and subcutaneous tissue: Secondary | ICD-10-CM | POA: Diagnosis not present

## 2019-03-20 DIAGNOSIS — L570 Actinic keratosis: Secondary | ICD-10-CM | POA: Diagnosis not present

## 2019-04-12 ENCOUNTER — Telehealth: Payer: Self-pay | Admitting: Family Medicine

## 2019-04-12 DIAGNOSIS — Z96641 Presence of right artificial hip joint: Secondary | ICD-10-CM

## 2019-04-12 DIAGNOSIS — S72141K Displaced intertrochanteric fracture of right femur, subsequent encounter for closed fracture with nonunion: Secondary | ICD-10-CM

## 2019-04-12 DIAGNOSIS — Z9889 Other specified postprocedural states: Secondary | ICD-10-CM

## 2019-04-12 DIAGNOSIS — S72001D Fracture of unspecified part of neck of right femur, subsequent encounter for closed fracture with routine healing: Secondary | ICD-10-CM

## 2019-04-12 NOTE — Telephone Encounter (Signed)
I'm sorry she's having ongoing trouble. Reasonable for second opinion - referral placed.

## 2019-04-12 NOTE — Telephone Encounter (Signed)
Patient called requesting a referral to a orthopedic surgeon  She stated that ever since she had her hip surgery she has been having on going problems and would really like to see a specialist to get a second opinion  She would like to see Dr Wynelle Link in [place]

## 2019-04-17 DIAGNOSIS — H18831 Recurrent erosion of cornea, right eye: Secondary | ICD-10-CM | POA: Diagnosis not present

## 2019-04-18 ENCOUNTER — Ambulatory Visit: Payer: Medicare Other

## 2019-05-10 ENCOUNTER — Encounter: Payer: Self-pay | Admitting: Family Medicine

## 2019-05-14 ENCOUNTER — Ambulatory Visit: Payer: Medicare Other

## 2019-05-24 ENCOUNTER — Telehealth: Payer: Self-pay | Admitting: Family Medicine

## 2019-05-24 MED ORDER — DONEPEZIL HCL 5 MG PO TABS: 5.0000 mg | ORAL_TABLET | Freq: Every day | ORAL | 1 refills | Status: DC

## 2019-05-24 MED ORDER — MONTELUKAST SODIUM 10 MG PO TABS: 10.0000 mg | ORAL_TABLET | Freq: Every day | ORAL | 1 refills | Status: DC

## 2019-05-24 MED ORDER — SERTRALINE HCL 25 MG PO TABS: ORAL_TABLET | ORAL | 1 refills | Status: DC

## 2019-05-24 NOTE — Telephone Encounter (Signed)
Fax received from [facility] for Zetia, Zoloft, Singulair and Aricept.  I called patient and verified that she does want to change pharmacies.  Pt states that she just got a refill of her Zetia for 90 day supply and does not need this one at this time.  I advised her that as she gets about 2 weeks away from running out to contact our office for this refill.   I have refilled her medications requested to [facility]  Nothing further needed.

## 2019-05-29 DIAGNOSIS — D485 Neoplasm of uncertain behavior of skin: Secondary | ICD-10-CM | POA: Diagnosis not present

## 2019-05-29 DIAGNOSIS — Z85828 Personal history of other malignant neoplasm of skin: Secondary | ICD-10-CM | POA: Diagnosis not present

## 2019-05-29 DIAGNOSIS — C44722 Squamous cell carcinoma of skin of right lower limb, including hip: Secondary | ICD-10-CM | POA: Diagnosis not present

## 2019-05-30 ENCOUNTER — Telehealth: Payer: Self-pay | Admitting: Family Medicine

## 2019-05-30 DIAGNOSIS — M25551 Pain in right hip: Secondary | ICD-10-CM | POA: Diagnosis not present

## 2019-05-30 NOTE — Chronic Care Management (AMB) (Signed)
  Chronic Care Management   Note  05/30/2019 Name: Kristin Kramer MRN: [id number] DOB: [birth year]  Kristin Kramer is a 79 y.o. year old female who is a primary care patient of [name], MD. I reached out to [name] by phone today in response to a referral sent by Ms. Kristin Grosser [place]'s PCP, [name], MD.   Kristin Kramer was given information about Chronic Care Management services today including:  1. CCM service includes personalized support from designated clinical staff supervised by her physician, including individualized plan of care and coordination with other care providers 2. 24/7 contact phone numbers for assistance for urgent and routine care needs. 3. Service will only be billed when office clinical staff spend 20 minutes or more in a month to coordinate care. 4. Only one practitioner may furnish and bill the service in a calendar month. 5. The patient may stop CCM services at any time (effective at the end of the month) by phone call to the office staff.   Patient agreed to services and verbal consent obtained.   Follow up plan:   Raynicia Dukes [employer] Scheduler

## 2019-06-09 ENCOUNTER — Encounter: Payer: Self-pay | Admitting: Family Medicine

## 2019-06-10 ENCOUNTER — Telehealth: Payer: Self-pay

## 2019-06-10 NOTE — Telephone Encounter (Signed)
Noted.  FYI to Dr. G. 

## 2019-06-10 NOTE — Telephone Encounter (Addendum)
Received faxed pre op eval form from [facility] to be completed and faxed back to [phone], attn:  Glendale Chard.   Left message on vm per dpr asking pt to call back to schedule pre-op eval OV (30 min).  [Form is in basket on [employer].]

## 2019-06-10 NOTE — Telephone Encounter (Signed)
Patient scheduled appointment on 07/10/19.  Her surgery is scheduled for 07/31/19.  Patient, also, cancelled her cpx and wellness visit in June and will call back to reschedule those appointments.

## 2019-06-17 ENCOUNTER — Other Ambulatory Visit: Payer: Self-pay

## 2019-06-17 ENCOUNTER — Ambulatory Visit
Admission: RE | Admit: 2019-06-17 | Discharge: 2019-06-17 | Disposition: A | Discharge status: Home / Self Care | Payer: Medicare Other | Source: Ambulatory Visit | Attending: Family Medicine | Admitting: Family Medicine

## 2019-06-17 DIAGNOSIS — Z1231 Encounter for screening mammogram for malignant neoplasm of breast: Secondary | ICD-10-CM

## 2019-06-21 ENCOUNTER — Telehealth: Payer: Self-pay

## 2019-06-21 DIAGNOSIS — F331 Major depressive disorder, recurrent, moderate: Secondary | ICD-10-CM

## 2019-06-21 DIAGNOSIS — E785 Hyperlipidemia, unspecified: Secondary | ICD-10-CM

## 2019-06-21 NOTE — Telephone Encounter (Signed)
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

## 2019-06-24 DIAGNOSIS — C44722 Squamous cell carcinoma of skin of right lower limb, including hip: Secondary | ICD-10-CM | POA: Diagnosis not present

## 2019-06-25 ENCOUNTER — Telehealth: Payer: Self-pay

## 2019-06-25 ENCOUNTER — Ambulatory Visit: Payer: Medicare Other

## 2019-06-25 ENCOUNTER — Other Ambulatory Visit: Payer: Self-pay

## 2019-06-25 DIAGNOSIS — R7989 Other specified abnormal findings of blood chemistry: Secondary | ICD-10-CM

## 2019-06-25 DIAGNOSIS — M5090 Cervical disc disorder, unspecified, unspecified cervical region: Secondary | ICD-10-CM

## 2019-06-25 DIAGNOSIS — E785 Hyperlipidemia, unspecified: Secondary | ICD-10-CM

## 2019-06-25 DIAGNOSIS — M8580 Other specified disorders of bone density and structure, unspecified site: Secondary | ICD-10-CM

## 2019-06-25 NOTE — Telephone Encounter (Signed)
PCP consult regarding CCM visit 06/25/19: elevated TSH  Patient asked about mildly elevated TSH; Reports fatigue has worsened over past 6 months, some cold intolerance and hair loss. We discussed risks/benefits of treating mild elevations. She is interested in trial of thyroid hormone replacement to see if symptoms improve. She has a visit with you on 07/10/19 for pre-op eval.   [name], PharmD Clinical Pharmacist [facility] at [facility] [phone]

## 2019-06-25 NOTE — Patient Instructions (Signed)
June 25, 2019  Dear Kristin Kramer,  It was a pleasure meeting you during our initial appointment on June 25, 2019. Below is a summary of the goals we discussed and components of chronic care management. Please contact me anytime with questions or concerns.   Visit Information  Goals Addressed            This Visit's Progress   . Pharmacy Care Plan       CARE PLAN ENTRY  Current Barriers:  . Chronic Disease Management support, education, and care coordination needs related to chronic neck and hip pain, osteopenia, elevated TSH  Pharmacist Clinical Goal(s):  Marland Kitchen Over the next 3 months, patient will work with PharmD and primary care provider to address the following goals: o Address patient concern:  Improve fatigue, hair loss, cold intolerance through medication management for elevated TSH  o Osteopenia: Achieve daily intake of calcium (1200 mg) through dietary choices; Examples include 4 servings of yogurt, cheese, or low fat milk daily as well as certain leafy green vegetables, nuts, and fortified cereals.  o Chronic neck pain: Improve pain level and frequency (currently severe and daily pain) by scheduling Tylenol 500 mg, 1 tablet every 4-6 hours (three-four times daily)  Interventions: . Comprehensive medication review performed . Consult with primary care provider for medication management/hyroid hormone supplement   Patient Self Care Activities:  For the next 3 months until follow up visit:  . Achieve daily calcium intake of 1200 mg from dietary choices . Continue to walk 2-3 days per week for 45-60 minutes with goal of 150 minutes per week; May reduce intervals to 30 minutes to reduce hip pain . Continue to take medications as prescribed  Initial goal documentation    . Pharmacy Care Plan: Hyperlipidemia       CARE PLAN ENTRY  Current Barriers:  . Uncontrolled hyperlipidemia . Current antihyperlipidemic regimen:   Zetia 10 mg - 1 tablet daily (started  08/2018)  Omega 3 fatty acids 1600 mg - 1 capsule daily . Previous antihyperlipidemic medications tried atorvastatin, pravastatin (muscle cramps) . Most recent lipid panel:     Component Value Date/Time   CHOL 221 (H) 12/17/2018 0813   TRIG 137.0 12/17/2018 0813   TRIG 97 06/24/2011 0000   HDL 58.30 12/17/2018 0813   CHOLHDL 4 12/17/2018 0813   VLDL 27.4 12/17/2018 0813   LDLCALC 136 (H) 12/17/2018 0813   LDLDIRECT 146.4 08/13/2012 0826 .  ASCVD risk enhancing conditions: age >40, LDL > 100 . 10-year ASCVD risk score: 26.1% (this indicates that among 100 patients with the similar risk factors to you, 26 would be expected to have a heart attack or stroke in the next 10 years); This risk may be reduced with a statin medication, achieving 150 minutes of exercise weekly, and a heart healthy diet  Pharmacist Clinical Goal(s):  Marland Kitchen Over the next 3 months, patient will work with PharmD and primary care providers towards the following goals: o Achieving LDL (bad cholesterol) less than 100 mg/dL o Exercising for 150 minutes per week o Adhering to diet emphasizing intake of vegetables, fruits, legumes, nuts, whole grains, and fish and reducing intake of high cholesterol content foods  Interventions: . Comprehensive medication review performed; medication list updated in electronic medical record.  . Discussed cholesterol goals, diet, and exercise  . Discussed benefits of once weekly statin with reduced adverse effects  Patient Self Care Activities:  . Patient will continue to walk for 45-60 minutes 2-3 days per  week or sum total of 150 minutes per week in shorter intervals  . Please let me know if you are willing and interested in trying a once weekly statin to reduce your cardiovascular risk   Initial goal documentation       [name] was given information about Chronic Care Management services today including:  1. CCM service includes personalized support from designated clinical  staff supervised by her physician, including individualized plan of care and coordination with other care providers 2. 24/7 contact phone numbers for assistance for urgent and routine care needs. 3. Standard insurance, coinsurance, copays and deductibles apply for chronic care management only during months in which we provide at least 20 minutes of these services. Most insurances cover these services at 100%, however patients may be responsible for any copay, coinsurance and/or deductible if applicable. This service may help you avoid the need for more expensive face-to-face services. 4. Only one practitioner may furnish and bill the service in a calendar month. 5. The patient may stop CCM services at any time (effective at the end of the month) by phone call to the office staff.  Patient agreed to services and verbal consent obtained.   The patient verbalized understanding of instructions provided today and agreed to receive a mailed copy of patient instruction and/or educational materials. Telephone follow up appointment with pharmacy team member scheduled for: 09/24/19 at 2:00 PM (telephone)  [name], PharmD Clinical Pharmacist [facility] [phone]   Calcium Content in Foods Calcium is the most abundant mineral in your body. Most of your body's calcium supply is stored in your bones and teeth. Calcium helps many parts of the body function normally, including:  Blood and blood vessels.  Nerves.  Hormones.  Muscles.  Bones and teeth. When your calcium stores are low, you may be at risk for low bone mass, bone loss, and broken bones (fractures). When you get enough calcium, it helps to support strong bones and teeth throughout your life. Calcium is especially important for:  Children during growth spurts.  Girls during adolescence.  Women who are pregnant or breastfeeding.  Women after their menstrual cycle stops (postmenopause).  Women whose  menstrual cycle has stopped due to anorexia nervosa or regular intense exercise.  People who cannot eat or digest dairy products.  Vegans. What are tips for getting more calcium? General information  Try to get most of your calcium from food. Eat foods that are high in calcium.  Some people may benefit from taking calcium supplements. Check with your health care provider or diet and nutrition specialist (dietitian) before starting any calcium supplements. Calcium supplements may interact with certain medicines. Too much calcium may cause other health problems, like constipation and kidney stones.  For the body to absorb calcium, it needs vitamin D. Sources of vitamin D include: ? Skin exposure to direct sunlight. ? Foods, such as egg yolks, liver, saltwater fish, and fortified milk. ? Vitamin D supplements. Check with your health care provider or dietitian before starting any vitamin D supplements. What foods are high in calcium?  High-calcium foods are those that contain more than 100 milligrams (mg) of calcium per serving. Fruits  Fortified orange or other fruit juice, 300 mg per 8 oz serving. Vegetables  Collard greens, 360 mg per 8 oz serving.  Kale, 180 mg per 8 oz serving.  Bok choy, 160 mg per 8 oz serving. Grains  Fortified ready-to-eat cereals, 100-1,000 mg per 8 oz serving.  Fortified frozen waffles, 200 mg in two waffles. Meats and other proteins  Sardines, canned with bones, 325 mg per 3 oz serving.  Salmon, canned with bones, 180 mg per 3 oz serving.  Canned shrimp, 125 mg per 3 oz serving.  Baked beans, 160 mg per 4 oz serving. Dairy  Yogurt, plain, low-fat, 310 mg per 6 oz serving.  Milk, 300 mg per 8 oz serving.  American cheese, 195 mg per 1 oz serving.  Cheddar cheese, 205 mg per 1 oz serving.  Cottage cheese 2%, 105 mg per 4 oz serving.  Fortified soy, rice, or almond milk, 300 mg per 8 oz serving. The items listed above may not be a complete  list of foods high in calcium. Actual amounts of calcium may be different depending on processing. Contact a dietitian for more information. What foods are lower in calcium? Foods lower in calcium are those that contain 50 mg of calcium or less per serving. Fruits  Apple, about 6 mg in one apple.  Banana, about 12 mg in one banana. Vegetables  Lettuce, 19 mg per 2 oz serving.  Tomato, about 11 mg in one tomato. Grains  Rice, 4 mg per 6 oz serving.  Boiled potatoes, 14 mg per 8 oz serving.  White bread, 6 mg in one slice. Meats and other proteins  Egg, 27 mg per 2 oz serving.  Red meat, 7 mg per 4 oz serving.  Chicken, 17 mg per 4 oz serving.  Fish, cod or trout, 20 mg per 4 oz serving. The items listed above may not be a complete list of foods lower in calcium. Actual amounts of calcium may be different depending on processing. Contact a dietitian for more information. Summary  Calcium is an important mineral in the body because it affects many functions. Getting enough calcium helps support strong bones and teeth throughout your life.  Try to get most of your calcium from food.  Calcium supplements may interact with certain medicines. Check with your health care provider before starting any calcium supplements. This information is not intended to replace advice given to you by your health care provider. Make sure you discuss any questions you have with your health care provider. Document Revised: 02/14/2017 Document Reviewed: 02/14/2017 Elsevier Patient Education  2020 [employer].

## 2019-06-25 NOTE — Chronic Care Management (AMB) (Signed)
Chronic Care Management Pharmacy  Name: Kristin Kramer  MRN: [id number] DOB: 04/23/1940  Chief Complaint/ HPI  Kristin Kramer,  79 y.o., female presents for their Initial CCM visit with the clinical pharmacist via telephone.  PCP : [name], MD  Their chronic conditions include: hyperlipidemia, depression, allergies osteopenia, vitamin D deficiency, memory deficit  Patient concerns: Elevated TSH, reports fatigue (used to having high energy) for the past 6 months, cold intolerance, some hair loss --> interested in starting thyroid supplement to see if fatigue improves; on B12 1000 mcg daily for energy  Office Visits: 08/15/18: [name] - cont current medications, update DEXA  Consult Visit: none in past 6 months   Allergies  Allergen Reactions  . Lipitor [Atorvastatin] Other (See Comments)    Cramps  . Pravastatin Other (See Comments)    Cramps    Medications: Outpatient Encounter Medications as of 06/25/2019  Medication Sig  . acetaminophen (TYLENOL) 500 MG tablet Take 500 mg by mouth 2 (two) times daily as needed (for pain.).  Marland Kitchen Cholecalciferol (VITAMIN D-3) 5000 units TABS Take 5,000 Units by mouth daily.   . Coenzyme Q10 (COQ10 PO) Take by mouth daily.  Marland Kitchen donepezil (ARICEPT) 5 MG tablet Take 1 tablet (5 mg total) by mouth daily.  Marland Kitchen ezetimibe (ZETIA) 10 MG tablet TAKE 1 TABLET BY MOUTH EVERY DAY  . famotidine (PEPCID) 20 MG tablet Take 1 tablet (20 mg total) by mouth at bedtime.  Marland Kitchen loratadine (CLARITIN) 10 MG tablet Take 1 tablet (10 mg total) by mouth daily.  . metroNIDAZOLE (METROCREAM) 0.75 % cream Apply 1 application topically 2 (two) times daily. Applied daily to patient face  . montelukast (SINGULAIR) 10 MG tablet Take 1 tablet (10 mg total) by mouth at bedtime.  . niacinamide 500 MG tablet Take 1 tablet (500 mg total) by mouth daily.  . Omega-3 Fatty Acids (FISH OIL PO) Take 1,600 mg by mouth daily.  . ondansetron (ZOFRAN ODT) 8 MG disintegrating  tablet Take 1 tablet (8 mg total) by mouth every 8 (eight) hours as needed for nausea or vomiting.  . Probiotic Product (PROBIOTIC PO) Take 1 capsule by mouth daily.   . sertraline (ZOLOFT) 25 MG tablet ALTERNATE 1 TABLET AND 1.5 TABLETS EVERY OTHER DAY  . Turmeric 500 MG CAPS Take 1 capsule by mouth daily.  . vitamin B-12 (CYANOCOBALAMIN) 1000 MCG tablet Take 1,000 mcg by mouth daily.   No facility-administered encounter medications on file as of 06/25/2019.   Current Diagnosis/Assessment:  SDOH:   Financial Resource Strain: Low Risk   . Difficulty of Paying Living Expenses: Not hard at all   Goals Addressed            This Visit's Progress   . Pharmacy Care Plan       CARE PLAN ENTRY  Current Barriers:  . Chronic Disease Management support, education, and care coordination needs related to chronic neck and hip pain, osteopenia, elevated TSH  Pharmacist Clinical Goal(s):  Marland Kitchen Over the next 3 months, patient will work with PharmD and primary care provider to address the following goals: o Address patient concern:  Improve fatigue, hair loss, cold intolerance through medication management for elevated TSH  o Osteopenia: Achieve daily intake of calcium (1200 mg) through dietary choices; Examples include 4 servings of yogurt, cheese, or low fat milk daily as well as certain leafy green vegetables, nuts, and fortified cereals.  o Chronic neck pain: Improve pain level and frequency (currently severe and daily  pain) by scheduling Tylenol 500 mg, 1 tablet every 4-6 hours (three-four times daily)  Interventions: . Comprehensive medication review performed . Consult with primary care provider for medication management/hyroid hormone supplement   Patient Self Care Activities:  For the next 3 months until follow up visit:  . Achieve daily calcium intake of 1200 mg from dietary choices . Continue to walk 2-3 days per week for 45-60 minutes with goal of 150 minutes per week; May reduce  intervals to 30 minutes to reduce hip pain . Continue to take medications as prescribed  Initial goal documentation    . Pharmacy Care Plan: Hyperlipidemia       CARE PLAN ENTRY  Current Barriers:  . Uncontrolled hyperlipidemia . Current antihyperlipidemic regimen:   Zetia 10 mg - 1 tablet daily (started 08/2018)  Omega 3 fatty acids 1600 mg - 1 capsule daily . Previous antihyperlipidemic medications tried atorvastatin, pravastatin (muscle cramps) . Most recent lipid panel:     Component Value Date/Time   CHOL 221 (H) 12/17/2018 0813   TRIG 137.0 12/17/2018 0813   TRIG 97 06/24/2011 0000   HDL 58.30 12/17/2018 0813   CHOLHDL 4 12/17/2018 0813   VLDL 27.4 12/17/2018 0813   LDLCALC 136 (H) 12/17/2018 0813   LDLDIRECT 146.4 08/13/2012 0826 .  ASCVD risk enhancing conditions: age >14, LDL > 100 . 10-year ASCVD risk score: 26.1% (this indicates that among 100 patients with the similar risk factors to you, 26 would be expected to have a heart attack or stroke in the next 10 years); This risk may be reduced with a statin medication, achieving 150 minutes of exercise weekly, and a heart healthy diet  Pharmacist Clinical Goal(s):  Marland Kitchen Over the next 3 months, patient will work with PharmD and primary care providers towards the following goals: o Achieving LDL (bad cholesterol) less than 100 mg/dL o Exercising for 150 minutes per week o Adhering to diet emphasizing intake of vegetables, fruits, legumes, nuts, whole grains, and fish and reducing intake of high cholesterol content foods  Interventions: . Comprehensive medication review performed; medication list updated in electronic medical record.  . Discussed cholesterol goals, diet, and exercise  . Discussed benefits of once weekly statin with reduced adverse effects  Patient Self Care Activities:  . Patient will continue to walk for 45-60 minutes 2-3 days per week or sum total of 150 minutes per week in shorter intervals  . Please  let me know if you are willing and interested in trying a once weekly statin to reduce your cardiovascular risk   Initial goal documentation      Hyperlipidemia   Lipid Panel     Component Value Date/Time   CHOL 221 (H) 12/17/2018 0813   TRIG 137.0 12/17/2018 0813   TRIG 97 06/24/2011 0000   HDL 58.30 12/17/2018 0813   CHOLHDL 4 12/17/2018 0813   VLDL 27.4 12/17/2018 0813   LDLCALC 136 (H) 12/17/2018 0813   LDLDIRECT 146.4 08/13/2012 0826    The 10-year ASCVD risk score (Goff DC Jr., et al., 2013) is: 26.1%   Values used to calculate the score:     Age: 57 years     Sex: Female     Is Non-Hispanic African American: No     Diabetic: No     Tobacco smoker: No     Systolic Blood Pressure: [id number] mmHg     Is BP treated: No     HDL Cholesterol: 58.3 mg/dL     Total Cholesterol:  221 mg/dL   LDL goal < 100 Patient has failed these meds in past: Lipitor, pravastatin - cramps Patient is currently uncontrolled on the following medications:   Zetia 10 mg - 1 tablet daily (started 08/2018)  Omega 3 fatty acids 1600 mg - 1 daily  CoQ10 - 1 daily   Diet: eats oatmeal every morning, Metamucil daily (takes for high fiber diet/loose stools); eats a lot chicken, vegetables, and fruit; denies sodas  Exercise: Walks 2-3 times a week/45-60 minutes   We discussed: trying a once weekly statin such as Crestor, patient will let me know; patient would like to know if she should continue omega 3 fatty acids and CoQ10 due to high cost, we discussed it would be fine to discontinue both as pt does not have history of high TG  Plan: Continue current medications  Memory   Patient has failed these meds in past: none Patient is currently controlled on the following medications:   Donepezil 5 mg - 1 tablet daily  We discussed: doing well, stable  Plan: Continue current medications   Osteopenia/Vitamin D Deficiency   DEXA 09/06/18 T-score left femur neck -2.1, no change from 2018 Vitamin D:  08/14/18 51  Patient has failed these meds in past: none  Patient is currently controlled on the following medications:   Vitamin D3 5000 IU - 1 tablet daily  We discussed: recommended daily calcium intake 1200 mg per day, examples of servings sizes   Plan: Continue current medications; Encourage daily calcium intake.   Depression   Patient has failed these meds in past: none  Patient is currently controlled on the following medications:   Sertraline 25 mg - alternate 1 and 1.5 tablets every other day  Mood: little motivation to do activities she used to do, finds herself wanting to stay home more rather than see friends, very low energy, unsure if its related to pain, depression, hypothyroid or combination; lives alone and pandemic has been challenging; still sees friends several times a week -->she is comfortable with current dose of sertraline, discussed setting goals and having a friend to share current difficulties with, please let us know if she needs anything or would like to try an increased dose of sertraline for this season   Plan: Continue current medications; Recommend contacting us if mood worsens or remains the same.  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 See chiropractor for neck pain twice monthly  Symptoms: Neck pain has worsened in the past year, feels more chronic/daily pain; very fatigued in the past 6 months despite sleeping well   Patient has tried these meds in past: Voltaren gel (worked well) Patient is currently uncontrolled on the following medications:   Tylenol Extra Strength 500 mg  - 1 tablet as needed   Naproxen or  Aleve - PRN (rarely uses)  Tumeric 500 mg - 1 tablet daily   Biofreeze - uses a couple times a week for neck pain   We discussed: able to live with constant hip pain, hopeful that upcoming surgery May 2021 will help; worst pain is in her neck lately, encouraged scheduling Tylenol    Plan: Continue current  medications; Try scheduling Tylenol 500 mg TID-QID.   Vaccines   Reviewed and discussed patient's vaccination history.    Immunization History  Administered Date(s) Administered  . Influenza Split 12/01/2011  . Influenza, High Dose Seasonal PF 02/10/2014, 11/27/2017  . Influenza,inj,Quad PF,6+ Mos 01/14/2013, 12/29/2014, 02/04/2016  . Influenza-Unspecified 01/03/2017  . Pneumococcal Conjugate-13 07/31/2014  . Pneumococcal Polysaccharide-23 12/01/2011  . Tdap 10/22/2014  . Zoster 02/14/2013  . Zoster Recombinat (Shingrix) 11/29/2016, 10/21/2017   Plan: Vaccinations are up to date.   Medication Management  Misc: metronidazole 0.75% cream, Zofran 8 mg ODT, Niacinamide 500 mg - 1 tablet at bedtime (taking for skin cancer/per dermatology)  OTCs: famotidine 20 mg, probiotic, vitamin B12 1000 mcg   Pharmacy/Benefits: [employer]/Mail order  Adherence: no concerns   Affordability: no concerns   CCM Follow Up: 09/24/19 at 2:00 PM (telephone)  [name], PharmD Clinical Pharmacist [facility] at [facility] [phone]

## 2019-06-25 NOTE — Chronic Care Management (AMB) (Deleted)
Chronic Care Management Pharmacy  Name: Kristin Kramer  MRN: [id number] DOB: Oct 25, 1940  Chief Complaint/ HPI  Kristin Kramer,  79 y.o., female presents for their Initial CCM visit with the clinical pharmacist via telephone.  PCP : [name], MD  Their chronic conditions include: migraine, hyperlipidemia, depression, allergies Osteopenia, vitamin D deficiency, memory deficit  Patient concerns:  Office Visits: 08/15/18: Kristin Kramer - cont current medications, update DEXA  Consult Visit:  Allergies  Allergen Reactions  . Lipitor [Atorvastatin] Other (See Comments)    Cramps  . Pravastatin Other (See Comments)    Cramps    Medications: Outpatient Encounter Medications as of 06/25/2019  Medication Sig  . acetaminophen (TYLENOL) 500 MG tablet Take 500 mg by mouth 2 (two) times daily as needed (for pain.).  Marland Kitchen Cholecalciferol (VITAMIN D-3) 5000 units TABS Take 5,000 Units by mouth daily.   . Coenzyme Q10 (COQ10 PO) Take by mouth daily.  Marland Kitchen donepezil (ARICEPT) 5 MG tablet Take 1 tablet (5 mg total) by mouth daily.  Marland Kitchen ezetimibe (ZETIA) 10 MG tablet TAKE 1 TABLET BY MOUTH EVERY DAY  . famotidine (PEPCID) 20 MG tablet Take 1 tablet (20 mg total) by mouth at bedtime.  Marland Kitchen loratadine (CLARITIN) 10 MG tablet Take 1 tablet (10 mg total) by mouth daily.  . metroNIDAZOLE (METROCREAM) 0.75 % cream Apply 1 application topically 2 (two) times daily. Applied daily to patient face  . montelukast (SINGULAIR) 10 MG tablet Take 1 tablet (10 mg total) by mouth at bedtime.  . niacinamide 500 MG tablet Take 1 tablet (500 mg total) by mouth daily.  . Omega-3 Fatty Acids (FISH OIL PO) Take 1,600 mg by mouth daily.  . ondansetron (ZOFRAN ODT) 8 MG disintegrating tablet Take 1 tablet (8 mg total) by mouth every 8 (eight) hours as needed for nausea or vomiting.  . Probiotic Product (PROBIOTIC PO) Take 1 capsule by mouth daily.   . sertraline (ZOLOFT) 25 MG tablet ALTERNATE 1 TABLET AND 1.5  TABLETS EVERY OTHER DAY  . Turmeric 500 MG CAPS Take 1 capsule by mouth daily.  . vitamin B-12 (CYANOCOBALAMIN) 1000 MCG tablet Take 1,000 mcg by mouth daily.   No facility-administered encounter medications on file as of 06/25/2019.   Current Diagnosis/Assessment:  Goals Addressed   None    Hyperlipidemia   Lipid Panel     Component Value Date/Time   CHOL 221 (H) 12/17/2018 0813   TRIG 137.0 12/17/2018 0813   TRIG 97 06/24/2011 0000   HDL 58.30 12/17/2018 0813   CHOLHDL 4 12/17/2018 0813   VLDL 27.4 12/17/2018 0813   LDLCALC 136 (H) 12/17/2018 0813   LDLDIRECT 146.4 08/13/2012 0826     The 10-year ASCVD risk score Mikey Bussing DC Jr., et al., 2013) is: 26.1%   Values used to calculate the score:     Age: 42 years     Sex: Female     Is Non-Hispanic African American: No     Diabetic: No     Tobacco smoker: No     Systolic Blood Pressure: [id number] mmHg     Is BP treated: No     HDL Cholesterol: 58.3 mg/dL     Total Cholesterol: 221 mg/dL   LDL goal < 100 Patient has failed these meds in past: Lipitor, pravastatin - cramps Patient is currently {CHL Controlled/[phone]} on the following medications:   Zetia 10 mg - 1 tablet daily  Niacinamide 500 mg - 1 tablet at bedtime  Omega 3 fatty  acids 1600 mg - 1 daily  CoQ10 - 1 daily   We discussed:  {CHL HP Upstream Pharmacy discussion:[phone]}  Plan: Continue {CHL HP Upstream Pharmacy [id number]  Memory   Patient has failed these meds in past:  Patient is currently {CHL Controlled/Uncontrolled:[phone]} on the following medications:   Donepezil 5 mg - 1 tablet daily  We discussed: RDA calcium is 1200 mg per day  Plan: Continue {CHL HP Upstream Pharmacy [phone]}   Osteopenia/Vitamin D Deficiency   DEXA 09/06/18 T-score left femur neck -2.1, no change from 2018 Patient has failed these meds in past:  Patient is currently {CHL Controlled/Uncontrolled:[phone]} on the following  medications:   Vitamin D3 5000 IU - 1 tablet daily  We discussed:   Plan: Continue {CHL HP Upstream Pharmacy [id number]   Depression   Patient has failed these meds in past:  Patient is currently {CHL Controlled/Uncontrolled:[phone]} on the following medications:   Sertraline 25 mg - alternative 1 and 1.5 tablets every other day  We discussed:   Plan: Continue {CHL HP Upstream Pharmacy [phone]}   Allergies   Patient has failed these meds in past:  Patient is currently {CHL Controlled/Uncontrolled:[phone]} on the following medications:   Montelukast 10 mg - 1 tablet qhs   Loratadine 10 mg - 1 tablet daily  We discussed:   Plan: Continue {CHL HP Upstream Pharmacy [id number]   Vaccines   Reviewed and discussed patient's vaccination history.    Immunization History  Administered Date(s) Administered  . Influenza Split 12/01/2011  . Influenza, High Dose Seasonal PF 02/10/2014, 11/27/2017  . Influenza,inj,Quad PF,6+ Mos 01/14/2013, 12/29/2014, 02/04/2016  . Influenza-Unspecified 01/03/2017  . Pneumococcal Conjugate-13 07/31/2014  . Pneumococcal Polysaccharide-23 12/01/2011  . Tdap 10/22/2014  . Zoster 02/14/2013  . Zoster Recombinat (Shingrix) 11/29/2016, 10/21/2017   Plan: Vaccinations are up to date.   Medication Management  Misc: metronidazole 0.75% cream, Zofran 8 mg ODT  OTCs: Tylenol 500 mg, famotidine 20 mg,  probiotic, tumeric 500 mg, vitamin B12 1000 mcg   Pharmacy/Benefits: UHC/Mail order  Adherence:  Social support:  Affordability:  CCM Follow Up:  [name], PharmD Clinical Pharmacist [facility] at [facility] [phone]

## 2019-06-26 MED ORDER — LEVOTHYROXINE SODIUM 25 MCG PO TABS: 25.0000 ug | ORAL_TABLET | Freq: Every day | ORAL | 3 refills | Status: DC

## 2019-06-26 NOTE — Assessment & Plan Note (Signed)
See phone note 06/25/2019 - pt interested in trial levothyroxine. Sent in.

## 2019-06-26 NOTE — Telephone Encounter (Signed)
Ok to try levothyroxine 5mcg daily sent to pharmacy. Take first thing in the morning with glass of water on empty stomach.

## 2019-07-03 ENCOUNTER — Telehealth: Payer: Self-pay

## 2019-07-03 NOTE — Telephone Encounter (Signed)
Patient would like to schedule annual visit with PCP for about 6 weeks from today. Would you mind contacting to schedule?  [name], PharmD Clinical Pharmacist [facility] at [facility] [phone]

## 2019-07-03 NOTE — Telephone Encounter (Signed)
Called patient and got her scheduled for CPE, AWV and labs.

## 2019-07-09 NOTE — H&P (Signed)
TOTAL HIP REVISION ADMISSION H&P  Patient is admitted for left acetabular versus total hip arthroplasty revision  Subjective:  Chief Complaint: Left hip pain s/p left THA  HPI: Kristin Kramer, 79 y.o. female, has a history of pain and functional disability in the left hip status post left total hip arthroplasty and patient has failed non-surgical conservative treatments for greater than 12 weeks to include NSAID's and/or analgesics, supervised PT with diminished ADL's post treatment and activity modification. Onset of symptoms was abrupt, starting after surgery several years ago with stable course since that time. The patient noted prior procedures of the hip to include arthroplasty on the left hip. Patient currently rates pain in the left hip at 7 out of 10 with activity. Patient has pain and limited range of motion. This condition presents safety issues increasing the risk of falls. There is no current active infection.  Patient Active Problem List   Diagnosis Date Noted  . Leg cramps 08/15/2018  . Abnormal TSH 08/15/2018  . TMJ tenderness, right 02/08/2018  . Lactose intolerance 02/08/2018  . Status post total hip replacement, right 12/07/2017  . Closed intertrochanteric fracture with nonunion, right 11/28/2017  . Status post hip surgery 07/15/2017  . Closed right hip fracture ([place]) 07/07/2017  . Chronic nasal congestion 04/19/2017  . Exertional dyspnea 04/19/2017  . Memory deficit 08/05/2016  . 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 Advanced care planning/counseling discussion 07/31/2014  . Diarrhea 07/31/2014  . Right rib fracture 05/21/2014  . 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  . Post-operative nausea and vomiting   . Refusal of blood transfusions as patient is Jehovah's Witness   . Medicare annual wellness visit, subsequent 01/14/2013  . Elevated alkaline phosphatase level 01/03/2013  . 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  Osteopenia   . HLD (hyperlipidemia)   . Vitamin D deficiency   . Seasonal allergies   . Squamous cell skin cancer   . MDD (major depressive disorder), recurrent episode, moderate ([place])   . Migraines   . Abnormal EKG     Past Medical History:  Diagnosis Date  . Abdominal pain, other specified site 10.2014   [facility] ER - mesenteric adenitis vs sclerosing mesenteritis vs nonspecific lymphadenitis  . Abnormal ECG    a. left axis deviation;  b. 12/2011 Echo: EF 55-60%, no rwma, pasp 71mmHg.  . Arthritis   . Depression    prior on lexapro, celexa, wellbutrin.  high cymbalta doses cause tremors  . GERD (gastroesophageal reflux disease)   . H/O seasonal allergies   . Herpes zoster 02/19/2014  . History of pneumonia   . History of UTI    12/2011 - Treatment began preoperatively with CIPRO 500mg  BID  . HLD (hyperlipidemia)    statin intolerance  . Knee osteoarthritis 2013, 2014   bilateral s/p B TKR [facility] [place])  . Knee osteoarthritis   . 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 Migraines   . Osteopenia 08/2016   hip -2.4, spine -1.4  . Post-operative nausea and vomiting   . Pre-diabetes   . Refusal of blood transfusions as patient is Jehovah's Witness   . Seasonal allergies    cats, dust, mold, roaches  . 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 Isac Sarna), R tibia - then recurrent x2, L tibia, R tibia x3  . Tubular adenoma of colon 08/2014  . Vitamin D  deficiency     Past Surgical History:  Procedure Laterality Date  . AUGMENTATION MAMMAPLASTY    . BREAST ENHANCEMENT SURGERY    . BUNIONECTOMY  2013   R and L foot  . CATARACT EXTRACTION  2013   R, pending L  . COLONOSCOPY  2007   no records received  . COLONOSCOPY  08/2014   tubular adenoma, rpt 5 yrs [facility])  . CONVERSION TO TOTAL HIP Right 12/07/2017   Procedure: CONVERSION TO TOTAL HIP ( REMOVING SMITH AND NEPHEW NAIL);  Surgeon:  Hessie Knows, MD;  Location: [facility] ORS;  Service: Orthopedics;  Laterality: Right;  . COSMETIC SURGERY  2002   face lift  . dexa  06/2008   T -1.4 spine and hip  . ETHMOIDECTOMY Bilateral 05/19/2016   Procedure: ETHMOIDECTOMY;  Surgeon: Carloyn Manner, MD;  Location: [facility] ORS;  Service: ENT;  Laterality: Bilateral;  . EYE SURGERY     bilateral cataract surgery  . FRONTAL SINUS EXPLORATION Bilateral 05/19/2016   Procedure: FRONTAL SINUS EXPLORATION;  Surgeon: Carloyn Manner, MD;  Location: [facility] ORS;  Service: ENT;  Laterality: Bilateral;  . INTRAMEDULLARY (IM) NAIL INTERTROCHANTERIC Right 07/08/2017   Procedure: RIGHT INTERTROCHANTRIC IM NAIL;  Surgeon: Leandrew Koyanagi, MD;  Location: [place];  Service: Orthopedics;  Laterality: Right;  . MAXILLARY ANTROSTOMY Bilateral 05/19/2016   Procedure: MAXILLARY ANTROSTOMY;  Surgeon: Carloyn Manner, MD;  Location: [facility] ORS;  Service: ENT;  Laterality: Bilateral;  . MOHS SURGERY  2011   R leg  . PARTIAL HYSTERECTOMY  1980   fibroids, ovaries remained  . SINUS ENDO W/FUSION N/A 05/19/2016   Procedure: ENDOSCOPIC SINUS SURGERY WITH NAVIGATION;  Surgeon: Carloyn Manner, MD;  Location: [facility] ORS;  Service: ENT;  Laterality: N/A;  . SPHENOIDECTOMY Bilateral 05/19/2016   Procedure: Coralee Pesa;  Surgeon: Carloyn Manner, MD;  Location: [facility] ORS;  Service: ENT;  Laterality: Bilateral;  . SQUAMOUS CELL CARCINOMA EXCISION Right 07/13/2016   Dr. Rolm Bookbinder, [facility]  . TONSILLECTOMY AND ADENOIDECTOMY  1945  . TOTAL KNEE ARTHROPLASTY  01/02/2012   Right - Surgeon: Lorn Junes, MD  . TOTAL KNEE ARTHROPLASTY Left 03/18/2013   Surgeon: Lorn Junes, MD    Prior to Admission medications   Medication Sig Start Date End Date Taking? Authorizing Provider  acetaminophen (TYLENOL) 500 MG tablet Take 500 mg by mouth 2 (two) times daily as needed (for pain.).    [provider]  Cholecalciferol (VITAMIN D-3) 5000 units TABS Take 5,000  Units by mouth daily.     [provider]  Coenzyme Q10 (COQ10 PO) Take by mouth daily.    [provider]  donepezil (ARICEPT) 5 MG tablet Take 1 tablet (5 mg total) by mouth daily. 05/24/19   [name], MD  ezetimibe (ZETIA) 10 MG tablet TAKE 1 TABLET BY MOUTH EVERY DAY 03/12/19   [name], MD  famotidine (PEPCID) 20 MG tablet Take 1 tablet (20 mg total) by mouth at bedtime. 08/15/18   [name], MD  levothyroxine (SYNTHROID) 25 MCG tablet Take 1 tablet (25 mcg total) by mouth daily before breakfast. 06/26/19   [name], MD  loratadine (CLARITIN) 10 MG tablet Take 1 tablet (10 mg total) by mouth daily. 04/19/17   [name], MD  metroNIDAZOLE (METROCREAM) 0.75 % cream Apply 1 application topically 2 (two) times daily. Applied daily to patient face 04/08/16   [provider]  montelukast (SINGULAIR) 10 MG tablet Take 1 tablet (10 mg total) by mouth  at bedtime. 05/24/19   [name], MD  niacinamide 500 MG tablet Take 1 tablet (500 mg total) by mouth daily. 08/15/18   [name], MD  Omega-3 Fatty Acids (FISH OIL PO) Take 1,600 mg by mouth daily.    [provider]  ondansetron (ZOFRAN ODT) 8 MG disintegrating tablet Take 1 tablet (8 mg total) by mouth every 8 (eight) hours as needed for nausea or vomiting. 12/10/17   Lattie Corns, PA-C  Probiotic Product (PROBIOTIC PO) Take 1 capsule by mouth daily.     [provider]  sertraline (ZOLOFT) 25 MG tablet ALTERNATE 1 TABLET AND 1.5 TABLETS EVERY OTHER DAY 05/24/19   [name], MD  Turmeric 500 MG CAPS Take 1 capsule by mouth daily.    [provider]  vitamin B-12 (CYANOCOBALAMIN) 1000 MCG tablet Take 1,000 mcg by mouth daily.    [provider]    Allergies  Allergen Reactions  . Lipitor [Atorvastatin] Other (See Comments)    Cramps  . Pravastatin Other (See Comments)    Cramps     Social History   Socioeconomic History    . Marital status: Widowed    Spouse name: Not on file  . Number of children: 3  . Years of education: Not on file  . Highest education level: Not on file  Occupational History  . Occupation: Retired  Tobacco Use  . Smoking status: Never Smoker  . Smokeless tobacco: Never Used  Substance and Sexual Activity  . Alcohol use: Yes    Alcohol/week: 0.0 standard drinks    Comment: occasional // 5-6 drinks per month  . Drug use: No  . Sexual activity: Not Currently  Other Topics Concern  . Not on file  Social History Narrative   Lives alone.  Brother lives nearby.   Occupation: retired, was Network engineer and housewife   Edu: 1 yr college   Activity: limited by knee   Diet: good water, fruits/vegetables daily   Religion: Jehova's witness   Social Determinants of Health   Financial Resource Strain: Low Risk   . Difficulty of Paying Living Expenses: Not hard at all  Food Insecurity:   . Worried About Charity fundraiser in the Last Year:   . Arboriculturist in the Last Year:   Transportation Needs:   . Film/video editor (Medical):   Marland Kitchen Lack of Transportation (Non-Medical):   Physical Activity:   . Days of Exercise per Week:   . Minutes of Exercise per Session:   Stress:   . Feeling of Stress :   Social Connections:   . Frequency of Communication with Friends and Family:   . Frequency of Social Gatherings with Friends and Family:   . Attends Religious Services:   . Active Member of Clubs or Organizations:   . Attends Archivist Meetings:   Marland Kitchen Marital Status:   Intimate Partner Violence:   . Fear of Current or Ex-Partner:   . Emotionally Abused:   Marland Kitchen Physically Abused:   . Sexually Abused:       Tobacco Use: Low Risk   . Smoking Tobacco Use: Never Smoker  . Smokeless Tobacco Use: Never Used   Social History   Substance and Sexual Activity  Alcohol Use Yes  . Alcohol/week: 0.0 standard drinks   Comment: occasional // 5-6 drinks per month    Family  History  Problem Relation Age of Onset  . Cancer Mother 42  lung, smoker  . Dementia Mother 73  . Cancer Father        lung, smoker  . Cancer Maternal Grandmother 67       leukemia  . Coronary artery disease Paternal Grandmother 26       sudden cardiac death  . Diabetes Neg Hx   . Stroke Neg Hx   . Breast cancer Neg Hx     Review of Systems  Constitutional: Negative for chills and fever.  HENT: Negative for congestion, sore throat and tinnitus.   Eyes: Negative for double vision, photophobia and pain.  Respiratory: Negative for cough, shortness of breath and wheezing.   Cardiovascular: Negative for chest pain, palpitations and orthopnea.  Gastrointestinal: Negative for heartburn, nausea and vomiting.  Genitourinary: Negative for dysuria, frequency and urgency.  Musculoskeletal: Positive for joint pain.  Neurological: Negative for dizziness, weakness and headaches.    Objective:  Physical Exam: Well nourished and well developed.  General: Alert and oriented x3, cooperative and pleasant, no acute distress.  Head: normocephalic, atraumatic, neck supple.  Eyes: EOMI.  Respiratory: breath sounds clear in all fields, no wheezing, rales, or rhonchi. Cardiovascular: Regular rate and rhythm, no murmurs, gallops or rubs.  Abdomen: non-tender to palpation and soft, normoactive bowel sounds. Musculoskeletal:  Right Hip Exam: Well-healed scar s/p RHA. The range of motion: Flexion to 90 degrees, Internal Rotation is minimal, External Rotation to 20 degrees, and abduction to 30 degrees with discomfort during range of motion. She cannot reproduce the "squeak" she has been complaining of but said anytime it gets into certain positions, the hip will squeak.  There is no tenderness over the greater trochanteric bursa.   Calves soft and nontender. Motor function intact in LE. Strength 5/5 LE bilaterally. Neuro: Distal pulses 2+. Sensation to light touch intact in LE.  Imaging  Review Radiographs- AP pelvis, AP and lateral of the right hip dated 05/30/2019 demonstrate a dual mobility total hip prosthesis. The socket is significantly adducted to the point where she might be impinging on the socket when she gets to certain ranges of motion. She has a femoral stem in an excellent position without any evidence of abnormalities.   Assessment/Plan:  Painful left total hip arthroplasty  The patient history, physical examination, clinical judgement of the provider and imaging studies are consistent with painful left total hip arthroplasty and acetabular versus total hip arthroplasty revision is deemed medically necessary. The risks and benefits of total hip arthroplasty were presented and reviewed. The risks due to aseptic loosening, infection, stiffness, dislocation/subluxation, thromboembolic complications and other imponderables were discussed. The patient acknowledged the explanation, agreed to proceed with the plan and consent was signed. Patient is being admitted for inpatient treatment for surgery, pain control, PT, OT, prophylactic antibiotics, VTE prophylaxis, progressive ambulation and ADLs and discharge planning.The patient is planning to be discharged home.   Patient's anticipated LOS is less than 2 midnights, meeting these requirements: - Younger than 17 - Lives within 1 hour of care - Has a competent adult at home to recover with post-op recover - NO history of  - Chronic pain requiring opiods  - Diabetes  - Coronary Artery Disease  - Heart failure  - Heart attack  - Stroke  - DVT/VTE  - Cardiac arrhythmia  - Respiratory Failure/COPD  - Renal failure  - Anemia  - Advanced Liver disease  Therapy Plans: HEP Disposition: Home with son Planned DVT Prophylaxis: Aspirin 325 mg BID DME Needed: None PCP: [name], MD (appt  on 5/5) TXA: IV Allergies: Pravastatin, atorvastatin Anesthesia Concerns: Nausea/vomiting, hypotension BMI: 24.9  Other:  -  Pt is Jehovah's Witness - will not accept blood products - Enteric-coated aspirin  - Patient was instructed on what medications to stop prior to surgery. - Follow-up visit in 2 weeks with Dr. Wynelle Link - Begin physical therapy following surgery - Pre-operative lab work as pre-surgical testing - Prescriptions will be provided in hospital at time of discharge  Theresa Duty, PA-C Orthopedic Surgery [facility] [place]

## 2019-07-10 ENCOUNTER — Encounter: Payer: Self-pay | Admitting: Family Medicine

## 2019-07-10 ENCOUNTER — Other Ambulatory Visit: Payer: Self-pay

## 2019-07-10 ENCOUNTER — Ambulatory Visit (INDEPENDENT_AMBULATORY_CARE_PROVIDER_SITE_OTHER)
Admission: RE | Admit: 2019-07-10 | Discharge: 2019-07-10 | Disposition: A | Discharge status: Home / Self Care | Payer: Medicare Other | Source: Ambulatory Visit | Attending: Family Medicine | Admitting: Family Medicine

## 2019-07-10 ENCOUNTER — Ambulatory Visit (INDEPENDENT_AMBULATORY_CARE_PROVIDER_SITE_OTHER): Payer: Medicare Other | Admitting: Family Medicine

## 2019-07-10 VITALS — BP 120/86 | HR 81 | Temp 97.8°F | Ht 65.0 in | Wt 150.2 lb

## 2019-07-10 DIAGNOSIS — R9431 Abnormal electrocardiogram [ECG] [EKG]: Secondary | ICD-10-CM

## 2019-07-10 DIAGNOSIS — E038 Other specified hypothyroidism: Secondary | ICD-10-CM

## 2019-07-10 DIAGNOSIS — Z9889 Other specified postprocedural states: Secondary | ICD-10-CM

## 2019-07-10 DIAGNOSIS — R413 Other amnesia: Secondary | ICD-10-CM | POA: Diagnosis not present

## 2019-07-10 DIAGNOSIS — E039 Hypothyroidism, unspecified: Secondary | ICD-10-CM

## 2019-07-10 DIAGNOSIS — F331 Major depressive disorder, recurrent, moderate: Secondary | ICD-10-CM

## 2019-07-10 DIAGNOSIS — R112 Nausea with vomiting, unspecified: Secondary | ICD-10-CM

## 2019-07-10 DIAGNOSIS — Z01818 Encounter for other preprocedural examination: Secondary | ICD-10-CM | POA: Diagnosis not present

## 2019-07-10 DIAGNOSIS — R918 Other nonspecific abnormal finding of lung field: Secondary | ICD-10-CM | POA: Diagnosis not present

## 2019-07-10 DIAGNOSIS — Z531 Procedure and treatment not carried out because of patient's decision for reasons of belief and group pressure: Secondary | ICD-10-CM | POA: Diagnosis not present

## 2019-07-10 DIAGNOSIS — F439 Reaction to severe stress, unspecified: Secondary | ICD-10-CM | POA: Insufficient documentation

## 2019-07-10 DIAGNOSIS — Z96641 Presence of right artificial hip joint: Secondary | ICD-10-CM

## 2019-07-10 LAB — POC URINALSYSI DIPSTICK (AUTOMATED)
Bilirubin, UA: NEGATIVE
Blood, UA: NEGATIVE
Glucose, UA: NEGATIVE
Ketones, UA: NEGATIVE
Leukocytes, UA: NEGATIVE
Nitrite, UA: NEGATIVE
Protein, UA: NEGATIVE
Spec Grav, UA: 1.01 (ref 1.010–1.025)
Urobilinogen, UA: 0.2 E.U./dL
pH, UA: 6 (ref 5.0–8.0)

## 2019-07-10 LAB — CBC WITH DIFFERENTIAL/PLATELET
Basophils Absolute: 0 10*3/uL (ref 0.0–0.1)
Basophils Relative: 0.9 % (ref 0.0–3.0)
Eosinophils Absolute: 0.2 10*3/uL (ref 0.0–0.7)
Eosinophils Relative: 3.3 % (ref 0.0–5.0)
HCT: 41.3 % (ref 36.0–46.0)
Hemoglobin: 13.6 g/dL (ref 12.0–15.0)
Lymphocytes Relative: 30.6 % (ref 12.0–46.0)
Lymphs Abs: 1.5 10*3/uL (ref 0.7–4.0)
MCHC: 33 g/dL (ref 30.0–36.0)
MCV: 88.2 fl (ref 78.0–100.0)
Monocytes Absolute: 0.5 10*3/uL (ref 0.1–1.0)
Monocytes Relative: 9.4 % (ref 3.0–12.0)
Neutro Abs: 2.7 10*3/uL (ref 1.4–7.7)
Neutrophils Relative %: 55.8 % (ref 43.0–77.0)
Platelets: 254 10*3/uL (ref 150.0–400.0)
RBC: 4.68 Mil/uL (ref 3.87–5.11)
RDW: 13.4 % (ref 11.5–15.5)
WBC: 4.8 10*3/uL (ref 4.0–10.5)

## 2019-07-10 LAB — COMPREHENSIVE METABOLIC PANEL
ALT: 21 U/L (ref 0–35)
AST: 25 U/L (ref 0–37)
Albumin: 4.6 g/dL (ref 3.5–5.2)
Alkaline Phosphatase: 118 U/L — ABNORMAL HIGH (ref 39–117)
BUN: 15 mg/dL (ref 6–23)
CO2: 33 mEq/L — ABNORMAL HIGH (ref 19–32)
Calcium: 9.9 mg/dL (ref 8.4–10.5)
Chloride: 101 mEq/L (ref 96–112)
Creatinine, Ser: 0.62 mg/dL (ref 0.40–1.20)
GFR: 93 mL/min (ref >60.00)
Glucose, Bld: 101 mg/dL — ABNORMAL HIGH (ref 70–99)
Potassium: 3.8 mEq/L (ref 3.5–5.1)
Sodium: 140 mEq/L (ref 135–145)
Total Bilirubin: 0.7 mg/dL (ref 0.2–1.2)
Total Protein: 7.7 g/dL (ref 6.0–8.3)

## 2019-07-10 LAB — PROTIME-INR
INR: 1 ratio (ref 0.8–1.0)
Prothrombin Time: 10.9 s (ref 9.6–13.1)

## 2019-07-10 LAB — HEMOGLOBIN A1C: Hgb A1c MFr Bld: 5.7 % (ref 4.6–6.5)

## 2019-07-10 LAB — BRAIN NATRIURETIC PEPTIDE: Pro B Natriuretic peptide (BNP): 176 pg/mL — ABNORMAL HIGH (ref 0.0–100.0)

## 2019-07-10 MED ORDER — ALPRAZOLAM 0.25 MG PO TABS: 0.2500 mg | ORAL_TABLET | Freq: Every day | ORAL | 1 refills | Status: DC | PRN

## 2019-07-10 NOTE — Addendum Note (Signed)
Addended by: Brenton Grills on: [id number] AM   Modules accepted: Orders

## 2019-07-10 NOTE — Assessment & Plan Note (Signed)
Residual pain post surgery - planning to have hip revision by Dr Maureen Ralphs.

## 2019-07-10 NOTE — Assessment & Plan Note (Addendum)
No blood transfusions. °

## 2019-07-10 NOTE — Assessment & Plan Note (Deleted)
Residual pain post surgery - planning to have (Dr Rudene Christians)

## 2019-07-10 NOTE — Progress Notes (Addendum)
This visit was conducted in person.  BP 120/86 (BP Location: Left Arm, Patient Position: Sitting, Cuff Size: Normal)   Pulse 81   Temp 97.8 F (36.6 C) (Temporal)   Ht 5\' 5"  (1.651 m)   Wt 150 lb 3 oz (68.1 kg)   SpO2 97%   BMI 24.99 kg/m    CC: preop evaluation Subjective:    Patient ID: Kristin Kramer, female    DOB: 09-04-1940, 79 y.o.   MRN: [id number]  HPI: Kristin Kramer is a 79 y.o. female presenting on 07/10/2019 for Pre-op Exam (Right hip surgery scheduled on 08/07/19.)   Upcoming R hip revision surgery planned 08/2019 by Dr Maureen Ralphs. Ongoing R constant hip pain.   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   Tolerates general anesthesia well without difficulty awakening. She has had post-op nausea/vomiting in the past - latest surgery 12/2017.   Denies fevers/chills, chest pain/tightness, dyspnea, dizziness. 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   Just started levothyroxine 5 days ago for borderline hypothyroidism - too soon to recheck.   Notes increasing anxiety due to upcoming surgery. This is affecting sleep - waking up at 2am and unable to fall back asleep. Requests xanax Rx - ativan wasn't as helpful. She continues sertraline 25mg  alternating 1 and 1.5 tablets daily.   Latest DEXA 08/2018 - T -2.1 at hip.  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     Relevant past medical, surgical, family and social history reviewed and updated as indicated. Interim medical history since our last visit reviewed. Allergies and medications reviewed and updated. Outpatient Medications Prior to Visit  Medication Sig Dispense Refill  . acetaminophen (TYLENOL) 500 MG tablet Take 500 mg by mouth 2 (two) times daily as needed (for pain.).    Marland Kitchen Cholecalciferol (VITAMIN D-3) 5000 units TABS  Take 5,000 Units by mouth daily.     . Coenzyme Q10 (COQ10 PO) Take by mouth daily.    Marland Kitchen donepezil (ARICEPT) 5 MG tablet Take 1 tablet (5 mg total) by mouth daily. 90 tablet 1  . ezetimibe (ZETIA) 10 MG tablet TAKE 1 TABLET BY MOUTH EVERY DAY 90 tablet 1  . famotidine (PEPCID) 20 MG tablet Take 1 tablet (20 mg total) by mouth at bedtime.    Marland Kitchen levothyroxine (SYNTHROID) 25 MCG tablet Take 1 tablet (25 mcg total) by mouth daily before breakfast. 30 tablet 3  . loratadine (CLARITIN) 10 MG tablet Take 1 tablet (10 mg total) by mouth daily.    . metroNIDAZOLE (METROCREAM) 0.75 % cream Apply 1 application topically 2 (two) times daily. Applied daily to patient face    . montelukast (SINGULAIR) 10 MG tablet Take 1 tablet (10 mg total) by mouth at bedtime. 90 tablet 1  . niacinamide 500 MG tablet Take 1 tablet (500 mg total) by mouth daily.    . Omega-3 Fatty Acids (FISH OIL PO) Take 1,600 mg by mouth daily.    . ondansetron (ZOFRAN ODT) 8 MG disintegrating tablet Take 1 tablet (8 mg total) by mouth every 8 (eight) hours as needed for nausea or vomiting. 30 tablet 1  . Probiotic Product (PROBIOTIC PO) Take 1 capsule by mouth daily.     . sertraline (ZOLOFT) 25 MG tablet ALTERNATE 1 TABLET AND 1.5 TABLETS EVERY OTHER DAY 113 tablet 1  . Turmeric  500 MG CAPS Take 1 capsule by mouth daily.    . vitamin B-12 (CYANOCOBALAMIN) 1000 MCG tablet Take 1,000 mcg by mouth daily.     No facility-administered medications prior to visit.     Per HPI unless specifically indicated in ROS section below Review of Systems Objective:  BP 120/86 (BP Location: Left Arm, Patient Position: Sitting, Cuff Size: Normal)   Pulse 81   Temp 97.8 F (36.6 C) (Temporal)   Ht 5\' 5"  (1.651 m)   Wt 150 lb 3 oz (68.1 kg)   SpO2 97%   BMI 24.99 kg/m   Wt Readings from Last 3 Encounters:  07/10/19 150 lb 3 oz (68.1 kg)  08/15/18 149 lb (67.6 kg)  02/08/18 152 lb 8 oz (69.2 kg)      Physical Exam Vitals and nursing note  reviewed.  Constitutional:      Appearance: Normal appearance. She is not ill-appearing.  Eyes:     Extraocular Movements: Extraocular movements intact.     Pupils: Pupils are equal, round, and reactive to light.  Cardiovascular:     Rate and Rhythm: Normal rate and regular rhythm.     Pulses: Normal pulses.     Heart sounds: Normal heart sounds. No murmur.  Pulmonary:     Effort: Pulmonary effort is normal. No respiratory distress.     Breath sounds: Normal breath sounds. No wheezing, rhonchi or rales.  Abdominal:     General: Abdomen is flat. Bowel sounds are normal. There is no distension.     Palpations: Abdomen is soft. There is no mass.     Tenderness: There is no abdominal tenderness. There is no right CVA tenderness, left CVA tenderness, guarding or rebound.     Hernia: No hernia is present.  Musculoskeletal:        General: Normal range of motion.     Cervical back: Normal range of motion and neck supple.     Right lower leg: Edema (tr) present.     Left lower leg: No edema.  Lymphadenopathy:     Cervical: No cervical adenopathy.  Skin:    General: Skin is warm and dry.     Findings: No erythema or rash.     Comments: Bandages to R anterior leg at sites of recent skin cancer removal  Neurological:     General: No focal deficit present.     Mental Status: She is alert.  Psychiatric:        Mood and Affect: Mood normal.        Behavior: Behavior normal.       Results for orders placed or performed in visit on 07/10/19  Protime-INR  Result Value Ref Range   INR 1.0 0.8 - 1.0 ratio   Prothrombin Time 10.9 9.6 - 13.1 sec  Comprehensive metabolic panel  Result Value Ref Range   Sodium 140 135 - 145 mEq/L   Potassium 3.8 3.5 - 5.1 mEq/L   Chloride 101 96 - 112 mEq/L   CO2 33 (H) 19 - 32 mEq/L   Glucose, Bld 101 (H) 70 - 99 mg/dL   BUN 15 6 - 23 mg/dL   Creatinine, Ser 0.62 0.40 - 1.20 mg/dL   Total Bilirubin 0.7 0.2 - 1.2 mg/dL   Alkaline Phosphatase 118 (H) 39  - 117 U/L   AST 25 0 - 37 U/L   ALT 21 0 - 35 U/L   Total Protein 7.7 6.0 - 8.3 g/dL   Albumin 4.6 3.5 -  5.2 g/dL   GFR 93.00 >60.00 mL/min   Calcium 9.9 8.4 - 10.5 mg/dL  CBC with Differential/Platelet  Result Value Ref Range   WBC 4.8 4.0 - 10.5 K/uL   RBC 4.68 3.87 - 5.11 Mil/uL   Hemoglobin 13.6 12.0 - 15.0 g/dL   HCT 41.3 36.0 - 46.0 %   MCV 88.2 78.0 - 100.0 fl   MCHC 33.0 30.0 - 36.0 g/dL   RDW 13.4 11.5 - 15.5 %   Platelets 254.0 150.0 - 400.0 K/uL   Neutrophils Relative % 55.8 43.0 - 77.0 %   Lymphocytes Relative 30.6 12.0 - 46.0 %   Monocytes Relative 9.4 3.0 - 12.0 %   Eosinophils Relative 3.3 0.0 - 5.0 %   Basophils Relative 0.9 0.0 - 3.0 %   Neutro Abs 2.7 1.4 - 7.7 K/uL   Lymphs Abs 1.5 0.7 - 4.0 K/uL   Monocytes Absolute 0.5 0.1 - 1.0 K/uL   Eosinophils Absolute 0.2 0.0 - 0.7 K/uL   Basophils Absolute 0.0 0.0 - 0.1 K/uL  Brain natriuretic peptide  Result Value Ref Range   Pro B Natriuretic peptide (BNP) 176.0 (H) 0.0 - 100.0 pg/mL  Hemoglobin A1c  Result Value Ref Range   Hgb A1c MFr Bld 5.7 4.6 - 6.5 %  POCT Urinalysis Dipstick (Automated)  Result Value Ref Range   Color, UA light yellow    Clarity, UA clear    Glucose, UA Negative Negative   Bilirubin, UA negative    Ketones, UA negative    Spec Grav, UA 1.010 1.010 - 1.025   Blood, UA negaive    pH, UA 6.0 5.0 - 8.0   Protein, UA Negative Negative   Urobilinogen, UA 0.2 0.2 or 1.0 E.U./dL   Nitrite, UA negative    Leukocytes, UA Negative Negative   EKG - NSR rate 70, LAD with LAFB, no acute ST/T changes. Unchanged from prior EKG 05/2016.  Assessment & Plan:  This visit occurred during the SARS-CoV-2 public health emergency.  Safety protocols were in place, including screening questions prior to the visit, additional usage of staff PPE, and extensive cleaning of exam room while observing appropriate contact time as indicated for disinfecting solutions.   Problem List Items Addressed This Visit     Subclinical hypothyroidism    Too soon to update levels on low dose levothyroxine (started late 06/2019).      Status post total hip replacement, right    Residual pain post surgery - planning to have hip revision by Dr Maureen Ralphs.       Situational stress    Worried about upcoming surgery (3rd surgery to R hip). Affecting sleep. 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  RCRI score = 0.  Check labs today including BNP, CXR, EKG, UA and if normal, no further eval deemed necessary.   ADDENDUM ==> BNP returned mildly elevated at 176 - therefore recommend post-op EKG and daily troponin for 2-3 days post-operatively.       Relevant Orders   EKG 12-Lead (Completed)   Protime-INR (Completed)   Comprehensive metabolic panel (Completed)   CBC with Differential/Platelet (Completed)   Brain natriuretic peptide (Completed)   DG Chest 2 View (Completed)  Hemoglobin A1c (Completed)   POCT Urinalysis Dipstick (Automated) (Completed)   Post-operative nausea and vomiting    H/o this - she will let anesthesia know.       Memory deficit    Minimal. Feels benefit from low dose aricept.       MDD (major depressive disorder), recurrent episode, moderate (HCC)    Stable period on alternating sertraline dose 25mg /37.5mg  daily - continue.       Relevant Medications   ALPRAZolam (XANAX) 0.25 MG tablet   Abnormal EKG    Known chronic LAFB with incomplete RBBB, update EKG today - unchanged from prior.           Meds ordered this encounter  Medications  . ALPRAZolam (XANAX) 0.25 MG tablet    Sig: Take 1 tablet (0.25 mg total) by mouth daily as needed for anxiety or sleep.    Dispense:  20 tablet    Refill:  1   Orders Placed This Encounter  Procedures  . DG Chest  2 View    Standing Status:   Future    Number of Occurrences:   1    Standing Expiration Date:   09/08/2020    Order Specific Question:   Reason for Exam (SYMPTOM  OR DIAGNOSIS REQUIRED)    Answer:   preop eval    Order Specific Question:   Preferred imaging location?    Answer:   [facility]    Order Specific Question:   Radiology Contrast Protocol - do NOT remove file path    Answer:   [email or web address]  . Protime-INR  . Comprehensive metabolic panel  . CBC with Differential/Platelet  . Brain natriuretic peptide  . Hemoglobin A1c  . POCT Urinalysis Dipstick (Automated)  . EKG 12-Lead    Patient Instructions  EKG today Labs and xray today.  We will forward results and clearance to Dr Alusio's office.  I hope for a speedy recovery!    Follow up plan: Return if symptoms worsen or fail to improve.  [name], MD

## 2019-07-10 NOTE — Assessment & Plan Note (Addendum)
Known chronic LAFB with incomplete RBBB, update EKG today - unchanged from prior.

## 2019-07-10 NOTE — Assessment & Plan Note (Addendum)
Overall low risk patient.  RCRI score = 0.  Check labs today including BNP, CXR, EKG, UA and if normal, no further eval deemed necessary.   ADDENDUM ==> BNP returned mildly elevated at 176 - therefore recommend post-op EKG and daily troponin for 2-3 days post-operatively.

## 2019-07-10 NOTE — Assessment & Plan Note (Signed)
H/o this - she will let anesthesia know.

## 2019-07-10 NOTE — Assessment & Plan Note (Addendum)
Minimal. Feels benefit from low dose aricept.

## 2019-07-10 NOTE — Patient Instructions (Addendum)
EKG today Labs and xray today.  We will forward results and clearance to Dr Alusio's office.  I hope for a speedy recovery!

## 2019-07-10 NOTE — Assessment & Plan Note (Addendum)
Too soon to update levels on low dose levothyroxine (started late 06/2019).

## 2019-07-10 NOTE — Assessment & Plan Note (Signed)
Stable period on alternating sertraline dose 25mg /37.5mg  daily - continue.

## 2019-07-10 NOTE — Assessment & Plan Note (Addendum)
Worried about upcoming surgery (3rd surgery to R hip). Affecting sleep. Requests PRN sparing xanax for sleep which has previously been effective (worked better than ativan in the past)

## 2019-07-18 DIAGNOSIS — C44729 Squamous cell carcinoma of skin of left lower limb, including hip: Secondary | ICD-10-CM | POA: Diagnosis not present

## 2019-07-19 ENCOUNTER — Telehealth: Payer: Self-pay

## 2019-07-19 NOTE — Telephone Encounter (Signed)
Spoke with pt relaying Dr. G's message.  Pt verbalizes understanding and expresses her thanks.  

## 2019-07-19 NOTE — Telephone Encounter (Signed)
Patient advised. Patient is aware that Dr Darnell Level will see these comments also and if he has any other comments we would call her back

## 2019-07-19 NOTE — Telephone Encounter (Signed)
Patient called stating she reviewed her lab results on mychart. She is concerned about BNP level. She has surgery coming up for 08/07/19. Dr Darnell Level noted this level was slightly elevated but no other comments were made. Patient wanted to know if she should be concerned? Anything she needs to do or be aware of due to this?  Patient is aware that Dr Darnell Level is out of the office for a few days and that another provider will review her note and try to answer. Her CB is [phone]

## 2019-07-19 NOTE — Telephone Encounter (Signed)
I checked back on Dr. Synthia Innocent note.  Based on the mild BNP elevation, he recommend post-op EKG and daily troponin for 2-3 days post-operatively.  It doesn't appear that her BNP level is elevated enough to alter her overall plan for surgery.  She shouldn't have to do anything extra at this point.    I routed this to Dr. Darnell Level in the meantime if he needs to provide any extra guidance.    Thanks.

## 2019-07-19 NOTE — Telephone Encounter (Signed)
Agree - I recommend post-op EKG and blood work - and communicated my recommendations to her surgeon through my preop eval OV.  plz let pt know.

## 2019-07-26 NOTE — Patient Instructions (Addendum)
DUE TO COVID-19 ONLY ONE VISITOR IS ALLOWED TO COME WITH YOU AND STAY IN THE WAITING ROOM ONLY DURING PRE OP AND PROCEDURE DAY OF SURGERY. THE 1 VISITOR MAY VISIT WITH YOU AFTER SURGERY IN YOUR PRIVATE ROOM DURING VISITING HOURS ONLY!  YOU NEED TO HAVE A COVID 19 TEST ON: 08/03/19 @  9:40 am ,THIS TEST MUST BE DONE BEFORE SURGERY, COME  [address], [place] , [id number].  ([place]) ONCE YOUR COVID TEST IS COMPLETED, PLEASE BEGIN THE QUARANTINE INSTRUCTIONS AS OUTLINED IN YOUR HANDOUT.                Kristin Kramer    Your procedure is scheduled on: 08/07/19   Report to [facility] Main  Entrance   Report to admitting at: 9:30 AM     Call this number if you have problems the morning of surgery [phone]    Remember:   NO SOLID FOOD AFTER MIDNIGHT THE NIGHT PRIOR TO SURGERY. NOTHING BY MOUTH EXCEPT CLEAR LIQUIDS UNTIL: 9:00 am . PLEASE FINISH ENSURE DRINK PER SURGEON ORDER  WHICH NEEDS TO BE COMPLETED AT: 9:00 am .   CLEAR LIQUID DIET   Foods Allowed                                                                     Foods Excluded  Coffee and tea, regular and decaf                             liquids that you cannot  Plain Jell-O any favor except red or purple                                           see through such as: Fruit ices (not with fruit pulp)                                     milk, soups, orange juice  Iced Popsicles                                    All solid food Carbonated beverages, regular and diet                                    Cranberry, grape and apple juices Sports drinks like Gatorade Lightly seasoned clear broth or consume(fat free) Sugar, honey syrup  Sample Menu Breakfast                                Lunch                                     Supper Cranberry juice  Beef broth                            Chicken broth Jell-O                                     Grape juice                            Apple juice Coffee or tea                        Jell-O                                      Popsicle                                                Coffee or tea                        Coffee or tea  _____________________________________________________________________   BRUSH YOUR TEETH MORNING OF SURGERY AND RINSE YOUR MOUTH OUT, NO CHEWING GUM CANDY OR MINTS.     Take these medicines the morning of surgery with A SIP OF WATER: levothyroxine,sertraline,donepezil.                                 You may not have any metal on your body including hair pins and              piercings  Do not wear jewelry, make-up, lotions, powders or perfumes, deodorant             Do not wear nail polish on your fingernails.  Do not shave  48 hours prior to surgery.            Do not bring valuables to the hospital. [place].  Contacts, dentures or bridgework may not be worn into surgery.  Leave suitcase in the car. After surgery it may be brought to your room.     Patients discharged the day of surgery will not be allowed to drive home. IF YOU ARE HAVING SURGERY AND GOING HOME THE SAME DAY, YOU MUST HAVE AN ADULT TO DRIVE YOU HOME AND BE WITH YOU FOR 24 HOURS. YOU MAY GO HOME BY TAXI OR UBER OR ORTHERWISE, BUT AN ADULT MUST ACCOMPANY YOU HOME AND STAY WITH YOU FOR 24 HOURS.  Name and phone number of your driver:  Special Instructions: N/A              Please read over the following fact sheets you were given: _____________________________________________________________________  [facility] - Preparing for Surgery Before surgery, you can play an important role.  Because skin is not sterile, your skin needs to be as free of germs as possible.  You can reduce the number of germs on your skin by washing with CHG (chlorahexidine gluconate) soap before surgery.  CHG is an antiseptic cleaner which kills germs and bonds with the skin to continue  killing germs even after washing. Please DO NOT use if you have an allergy to CHG or antibacterial soaps.  If your skin becomes reddened/irritated stop using the CHG and inform your nurse when you arrive at Short Stay. Do not shave (including legs and underarms) for at least 48 hours prior to the first CHG shower.  You may shave your face/neck. Please follow these instructions carefully:  1.  Shower with CHG Soap the night before surgery and the  morning of Surgery.  2.  If you choose to wash your hair, wash your hair first as usual with your  normal  shampoo.  3.  After you shampoo, rinse your hair and body thoroughly to remove the  shampoo.                           4.  Use CHG as you would any other liquid soap.  You can apply chg directly  to the skin and wash                       Gently with a scrungie or clean washcloth.  5.  Apply the CHG Soap to your body ONLY FROM THE NECK DOWN.   Do not use on face/ open                           Wound or open sores. Avoid contact with eyes, ears mouth and genitals (private parts).                       Wash face,  Genitals (private parts) with your normal soap.             6.  Wash thoroughly, paying special attention to the area where your surgery  will be performed.  7.  Thoroughly rinse your body with warm water from the neck down.  8.  DO NOT shower/wash with your normal soap after using and rinsing off  the CHG Soap.                9.  Pat yourself dry with a clean towel.            10.  Wear clean pajamas.            11.  Place clean sheets on your bed the night of your first shower and do not  sleep with pets. Day of Surgery : Do not apply any lotions/deodorants the morning of surgery.  Please wear clean clothes to the hospital/surgery center.  FAILURE TO FOLLOW THESE INSTRUCTIONS MAY RESULT IN THE CANCELLATION OF YOUR SURGERY PATIENT SIGNATURE_________________________________  NURSE  SIGNATURE__________________________________  ________________________________________________________________________   Kristin Kramer  An incentive spirometer is a tool that can help keep your lungs clear and active. This tool measures how well you are filling your lungs with each breath. Taking long deep breaths may help reverse or decrease the chance of developing breathing (pulmonary) problems (especially infection) following:  A long period of time when you are unable to move or be active. BEFORE THE PROCEDURE   If the spirometer includes an indicator to show your best effort, your nurse or respiratory therapist will set it to a desired goal.  If possible, sit up straight or lean slightly forward. Try not to slouch.  Hold the incentive spirometer in an upright position. INSTRUCTIONS FOR USE  1. Sit on the edge of your bed if possible, or sit up as far as you can in bed or on a chair. 2. Hold the incentive spirometer in an upright position. 3. Breathe out normally. 4. Place the mouthpiece in your mouth and seal your lips tightly around it. 5. Breathe in slowly and as deeply as possible, raising the piston or the ball toward the top of the column. 6. Hold your breath for 3-5 seconds or for as long as possible. Allow the piston or ball to fall to the bottom of the column. 7. Remove the mouthpiece from your mouth and breathe out normally. 8. Rest for a few seconds and repeat Steps 1 through 7 at least 10 times every 1-2 hours when you are awake. Take your time and take a few normal breaths between deep breaths. 9. The spirometer may include an indicator to show your best effort. Use the indicator as a goal to work toward during each repetition. 10. After each set of 10 deep breaths, practice coughing to be sure your lungs are clear. If you have an incision (the cut made at the time of surgery), support your incision when coughing by placing a pillow or rolled up towels firmly  against it. Once you are able to get out of bed, walk around indoors and cough well. You may stop using the incentive spirometer when instructed by your caregiver.  RISKS AND COMPLICATIONS  Take your time so you do not get dizzy or light-headed.  If you are in pain, you may need to take or ask for pain medication before doing incentive spirometry. It is harder to take a deep breath if you are having pain. AFTER USE  Rest and breathe slowly and easily.  It can be helpful to keep track of a log of your progress. Your caregiver can provide you with a simple table to help with this. If you are using the spirometer at home, follow these instructions: [place] IF:   You are having difficultly using the spirometer.  You have trouble using the spirometer as often as instructed.  Your pain medication is not giving enough relief while using the spirometer.  You develop fever of 100.5 F (38.1 C) or higher. SEEK IMMEDIATE MEDICAL CARE IF:   You cough up bloody sputum that had not been present before.  You develop fever of 102 F (38.9 C) or greater.  You develop worsening pain at or near the incision site. MAKE SURE YOU:   Understand these instructions.  Will watch your condition.  Will get help right away if you are not doing well or get worse. Document Released: 07/04/2006 Document Revised: 05/16/2011 Document Reviewed: 09/04/2006 [facility] Patient Information 2014 [place], [place].   ________________________________________________________________________

## 2019-07-29 ENCOUNTER — Encounter (HOSPITAL_COMMUNITY): Payer: Self-pay

## 2019-07-29 ENCOUNTER — Other Ambulatory Visit: Payer: Self-pay

## 2019-07-29 ENCOUNTER — Encounter (HOSPITAL_COMMUNITY)
Admission: RE | Admit: 2019-07-29 | Discharge: 2019-07-29 | Disposition: A | Discharge status: Home / Self Care | Payer: Medicare Other | Source: Ambulatory Visit | Attending: Orthopedic Surgery | Admitting: Orthopedic Surgery

## 2019-07-29 DIAGNOSIS — Z01812 Encounter for preprocedural laboratory examination: Secondary | ICD-10-CM | POA: Diagnosis not present

## 2019-07-29 HISTORY — DX: Hypothyroidism, unspecified: E03.9

## 2019-07-29 LAB — CBC
HCT: 44.5 % (ref 36.0–46.0)
Hemoglobin: 14.1 g/dL (ref 12.0–15.0)
MCH: 29.2 pg (ref 26.0–34.0)
MCHC: 31.7 g/dL (ref 30.0–36.0)
MCV: 92.1 fL (ref 80.0–100.0)
Platelets: 218 10*3/uL (ref 150–400)
RBC: 4.83 MIL/uL (ref 3.87–5.11)
RDW: 12.8 % (ref 11.5–15.5)
WBC: 5.2 10*3/uL (ref 4.0–10.5)
nRBC: 0 % (ref 0.0–0.2)

## 2019-07-29 LAB — COMPREHENSIVE METABOLIC PANEL
ALT: 24 U/L (ref 0–44)
AST: 23 U/L (ref 15–41)
Albumin: 4.4 g/dL (ref 3.5–5.0)
Alkaline Phosphatase: 110 U/L (ref 38–126)
Anion gap: 11 (ref 5–15)
BUN: 15 mg/dL (ref 8–23)
CO2: 29 mmol/L (ref 22–32)
Calcium: 9.7 mg/dL (ref 8.9–10.3)
Chloride: 106 mmol/L (ref 98–111)
Creatinine, Ser: 0.61 mg/dL (ref 0.44–1.00)
GFR calc Af Amer: >60 mL/min (ref >60)
GFR calc non Af Amer: >60 mL/min (ref >60)
Glucose, Bld: 111 mg/dL — ABNORMAL HIGH (ref 70–99)
Potassium: 4.5 mmol/L (ref 3.5–5.1)
Sodium: 146 mmol/L — ABNORMAL HIGH (ref 135–145)
Total Bilirubin: 0.4 mg/dL (ref 0.3–1.2)
Total Protein: 7.7 g/dL (ref 6.5–8.1)

## 2019-07-29 LAB — SURGICAL PCR SCREEN
MRSA, PCR: NEGATIVE
Staphylococcus aureus: NEGATIVE

## 2019-07-29 LAB — APTT: aPTT: 27 seconds (ref 24–36)

## 2019-07-29 LAB — PROTIME-INR
INR: 0.9 (ref 0.8–1.2)
Prothrombin Time: 12.1 seconds (ref 11.4–15.2)

## 2019-07-29 NOTE — Progress Notes (Signed)
Lab results: Sodium levels: 146.

## 2019-07-29 NOTE — Progress Notes (Addendum)
PCP - Dr. [name].Clearance: 07/10/19. Epic/Chart. Cardiologist -   Chest x-ray - 07/10/19 EKG -  Stress Test -  ECHO -  Cardiac Cath -  A1C: 5.7. :07/10/19 Sleep Study -  CPAP -   Fasting Blood Sugar -  Checks Blood Sugar _____ times a day  Blood Thinner Instructions: Aspirin Instructions: Last Dose:  Anesthesia review:   Patient denies shortness of breath, fever, cough and chest pain at PAT appointment   Patient verbalized understanding of instructions that were given to them at the PAT appointment. Patient was also instructed that they will need to review over the PAT instructions again at home before surgery.

## 2019-07-29 NOTE — Progress Notes (Signed)
Pt. Refuse blood transfusions.

## 2019-08-03 ENCOUNTER — Other Ambulatory Visit (HOSPITAL_COMMUNITY)
Admission: RE | Admit: 2019-08-03 | Discharge: 2019-08-03 | Disposition: A | Discharge status: Home / Self Care | Payer: Medicare Other | Source: Ambulatory Visit | Attending: Orthopedic Surgery | Admitting: Orthopedic Surgery

## 2019-08-03 DIAGNOSIS — Z01812 Encounter for preprocedural laboratory examination: Secondary | ICD-10-CM | POA: Diagnosis not present

## 2019-08-03 DIAGNOSIS — Z20822 Contact with and (suspected) exposure to covid-19: Secondary | ICD-10-CM | POA: Insufficient documentation

## 2019-08-03 LAB — SARS CORONAVIRUS 2 (TAT 6-24 HRS): SARS Coronavirus 2: NEGATIVE

## 2019-08-07 ENCOUNTER — Encounter (HOSPITAL_COMMUNITY)
Admission: RE | Disposition: A | Discharge status: Home Health | Payer: Self-pay | Source: Home / Self Care | Attending: Orthopedic Surgery

## 2019-08-07 ENCOUNTER — Inpatient Hospital Stay (HOSPITAL_COMMUNITY): Payer: Medicare Other

## 2019-08-07 ENCOUNTER — Inpatient Hospital Stay (HOSPITAL_COMMUNITY): Payer: Medicare Other | Admitting: Registered Nurse

## 2019-08-07 ENCOUNTER — Encounter (HOSPITAL_COMMUNITY): Payer: Self-pay | Admitting: Orthopedic Surgery

## 2019-08-07 ENCOUNTER — Inpatient Hospital Stay (HOSPITAL_COMMUNITY)
Admission: RE | Admit: 2019-08-07 | Discharge: 2019-08-08 | DRG: 468 | Disposition: A | Discharge status: Home Health | Payer: Medicare Other | Attending: Orthopedic Surgery | Admitting: Orthopedic Surgery

## 2019-08-07 ENCOUNTER — Other Ambulatory Visit: Payer: Self-pay

## 2019-08-07 DIAGNOSIS — Z96653 Presence of artificial knee joint, bilateral: Secondary | ICD-10-CM | POA: Diagnosis present

## 2019-08-07 DIAGNOSIS — E039 Hypothyroidism, unspecified: Secondary | ICD-10-CM | POA: Diagnosis not present

## 2019-08-07 DIAGNOSIS — K219 Gastro-esophageal reflux disease without esophagitis: Secondary | ICD-10-CM | POA: Diagnosis not present

## 2019-08-07 DIAGNOSIS — T84010D Broken internal right hip prosthesis, subsequent encounter: Secondary | ICD-10-CM

## 2019-08-07 DIAGNOSIS — T84090A Other mechanical complication of internal right hip prosthesis, initial encounter: Principal | ICD-10-CM | POA: Diagnosis present

## 2019-08-07 DIAGNOSIS — Z85828 Personal history of other malignant neoplasm of skin: Secondary | ICD-10-CM | POA: Diagnosis not present

## 2019-08-07 DIAGNOSIS — E785 Hyperlipidemia, unspecified: Secondary | ICD-10-CM | POA: Diagnosis present

## 2019-08-07 DIAGNOSIS — Z79899 Other long term (current) drug therapy: Secondary | ICD-10-CM

## 2019-08-07 DIAGNOSIS — T84010A Broken internal right hip prosthesis, initial encounter: Secondary | ICD-10-CM

## 2019-08-07 DIAGNOSIS — Y792 Prosthetic and other implants, materials and accessory orthopedic devices associated with adverse incidents: Secondary | ICD-10-CM | POA: Diagnosis present

## 2019-08-07 DIAGNOSIS — Z96641 Presence of right artificial hip joint: Secondary | ICD-10-CM | POA: Diagnosis not present

## 2019-08-07 DIAGNOSIS — T8484XA Pain due to internal orthopedic prosthetic devices, implants and grafts, initial encounter: Secondary | ICD-10-CM | POA: Diagnosis not present

## 2019-08-07 DIAGNOSIS — Z20822 Contact with and (suspected) exposure to covid-19: Secondary | ICD-10-CM | POA: Diagnosis not present

## 2019-08-07 DIAGNOSIS — Z471 Aftercare following joint replacement surgery: Secondary | ICD-10-CM | POA: Diagnosis not present

## 2019-08-07 DIAGNOSIS — Z96649 Presence of unspecified artificial hip joint: Secondary | ICD-10-CM

## 2019-08-07 DIAGNOSIS — G43909 Migraine, unspecified, not intractable, without status migrainosus: Secondary | ICD-10-CM | POA: Diagnosis not present

## 2019-08-07 DIAGNOSIS — T84018A Broken internal joint prosthesis, other site, initial encounter: Secondary | ICD-10-CM

## 2019-08-07 HISTORY — PX: TOTAL HIP REVISION: SHX763

## 2019-08-07 HISTORY — DX: Broken internal right hip prosthesis, initial encounter: T84.010A

## 2019-08-07 LAB — NO BLOOD PRODUCTS

## 2019-08-07 SURGERY — TOTAL HIP REVISION
Anesthesia: Spinal | Site: Hip | Laterality: Right

## 2019-08-07 MED ORDER — EPHEDRINE 5 MG/ML INJ
INTRAVENOUS | Status: AC
  Filled 2019-08-07: qty 10

## 2019-08-07 MED ORDER — MONTELUKAST SODIUM 10 MG PO TABS
10.0000 mg | ORAL_TABLET | Freq: Every day | ORAL | Status: DC
  Administered 2019-08-07: 10 mg via ORAL
  Filled 2019-08-07: qty 1

## 2019-08-07 MED ORDER — ACETAMINOPHEN 10 MG/ML IV SOLN
1000.0000 mg | Freq: Four times a day (QID) | INTRAVENOUS | Status: DC
  Administered 2019-08-07: 1000 mg via INTRAVENOUS
  Filled 2019-08-07: qty 100

## 2019-08-07 MED ORDER — ONDANSETRON HCL 4 MG/2ML IJ SOLN
INTRAMUSCULAR | Status: AC
  Filled 2019-08-07: qty 2

## 2019-08-07 MED ORDER — POLYETHYLENE GLYCOL 3350 17 G PO PACK: 17.0000 g | PACK | Freq: Every day | ORAL | Status: DC | PRN

## 2019-08-07 MED ORDER — LIDOCAINE 2% (20 MG/ML) 5 ML SYRINGE
INTRAMUSCULAR | Status: AC
  Filled 2019-08-07: qty 5

## 2019-08-07 MED ORDER — CEFAZOLIN SODIUM-DEXTROSE 2-4 GM/100ML-% IV SOLN
2.0000 g | Freq: Four times a day (QID) | INTRAVENOUS | Status: AC
  Administered 2019-08-07 (×2): 2 g via INTRAVENOUS
  Filled 2019-08-07 (×2): qty 100

## 2019-08-07 MED ORDER — LEVOTHYROXINE SODIUM 25 MCG PO TABS
25.0000 ug | ORAL_TABLET | Freq: Every day | ORAL | Status: DC
  Administered 2019-08-08: 25 ug via ORAL
  Filled 2019-08-07: qty 1

## 2019-08-07 MED ORDER — BUPIVACAINE HCL 0.25 % IJ SOLN
INTRAMUSCULAR | Status: AC
  Filled 2019-08-07: qty 1

## 2019-08-07 MED ORDER — EPHEDRINE SULFATE-NACL 50-0.9 MG/10ML-% IV SOSY
PREFILLED_SYRINGE | INTRAVENOUS | Status: DC | PRN
  Administered 2019-08-07: 15 mg via INTRAVENOUS

## 2019-08-07 MED ORDER — METOCLOPRAMIDE HCL 5 MG/ML IJ SOLN: 5.0000 mg | Freq: Three times a day (TID) | INTRAMUSCULAR | Status: DC | PRN

## 2019-08-07 MED ORDER — MENTHOL 3 MG MT LOZG: 1.0000 | LOZENGE | OROMUCOSAL | Status: DC | PRN

## 2019-08-07 MED ORDER — PROPOFOL 1000 MG/100ML IV EMUL
INTRAVENOUS | Status: AC
  Filled 2019-08-07: qty 100

## 2019-08-07 MED ORDER — SUGAMMADEX SODIUM 200 MG/2ML IV SOLN
INTRAVENOUS | Status: DC | PRN
  Administered 2019-08-07: 200 mg via INTRAVENOUS

## 2019-08-07 MED ORDER — DOCUSATE SODIUM 100 MG PO CAPS
100.0000 mg | ORAL_CAPSULE | Freq: Two times a day (BID) | ORAL | Status: DC
  Administered 2019-08-07 – 2019-08-08 (×2): 100 mg via ORAL
  Filled 2019-08-07 (×2): qty 1

## 2019-08-07 MED ORDER — HYDROCODONE-ACETAMINOPHEN 5-325 MG PO TABS
1.0000 | ORAL_TABLET | ORAL | Status: DC | PRN
  Administered 2019-08-07 – 2019-08-08 (×5): 2 via ORAL
  Filled 2019-08-07 (×5): qty 2

## 2019-08-07 MED ORDER — ONDANSETRON HCL 4 MG/2ML IJ SOLN
INTRAMUSCULAR | Status: DC | PRN
  Administered 2019-08-07: 4 mg via INTRAVENOUS

## 2019-08-07 MED ORDER — SERTRALINE HCL 25 MG PO TABS: 25.0000 mg | ORAL_TABLET | ORAL | Status: DC

## 2019-08-07 MED ORDER — POVIDONE-IODINE 10 % EX SWAB
2.0000 "application " | Freq: Once | CUTANEOUS | Status: AC
  Administered 2019-08-07: 2 via TOPICAL

## 2019-08-07 MED ORDER — FENTANYL CITRATE (PF) 100 MCG/2ML IJ SOLN
INTRAMUSCULAR | Status: AC
  Administered 2019-08-07: 50 ug via INTRAVENOUS
  Filled 2019-08-07: qty 2

## 2019-08-07 MED ORDER — ORAL CARE MOUTH RINSE: 15.0000 mL | Freq: Once | OROMUCOSAL | Status: AC

## 2019-08-07 MED ORDER — 0.9 % SODIUM CHLORIDE (POUR BTL) OPTIME
TOPICAL | Status: DC | PRN
  Administered 2019-08-07: 1000 mL

## 2019-08-07 MED ORDER — BUPIVACAINE HCL (PF) 0.5 % IJ SOLN
INTRAMUSCULAR | Status: AC
  Filled 2019-08-07: qty 30

## 2019-08-07 MED ORDER — ONDANSETRON HCL 4 MG PO TABS
4.0000 mg | ORAL_TABLET | Freq: Four times a day (QID) | ORAL | Status: DC | PRN
  Administered 2019-08-08: 4 mg via ORAL
  Filled 2019-08-07: qty 1

## 2019-08-07 MED ORDER — DEXAMETHASONE SODIUM PHOSPHATE 10 MG/ML IJ SOLN
10.0000 mg | Freq: Once | INTRAMUSCULAR | Status: AC
  Administered 2019-08-08: 10 mg via INTRAVENOUS
  Filled 2019-08-07: qty 1

## 2019-08-07 MED ORDER — FENTANYL CITRATE (PF) 100 MCG/2ML IJ SOLN
INTRAMUSCULAR | Status: DC | PRN
  Administered 2019-08-07 (×2): 50 ug via INTRAVENOUS

## 2019-08-07 MED ORDER — PROPOFOL 10 MG/ML IV BOLUS
INTRAVENOUS | Status: DC | PRN
  Administered 2019-08-07: 20 mg via INTRAVENOUS
  Administered 2019-08-07: 100 mg via INTRAVENOUS

## 2019-08-07 MED ORDER — PROPOFOL 500 MG/50ML IV EMUL
INTRAVENOUS | Status: DC | PRN
  Administered 2019-08-07: 100 ug/kg/min via INTRAVENOUS

## 2019-08-07 MED ORDER — TRANEXAMIC ACID-NACL 1000-0.7 MG/100ML-% IV SOLN
1000.0000 mg | INTRAVENOUS | Status: AC
  Administered 2019-08-07: 1000 mg via INTRAVENOUS
  Filled 2019-08-07: qty 100

## 2019-08-07 MED ORDER — BUPIVACAINE-EPINEPHRINE 0.25% -1:200000 IJ SOLN
INTRAMUSCULAR | Status: AC
  Filled 2019-08-07: qty 1

## 2019-08-07 MED ORDER — DEXAMETHASONE SODIUM PHOSPHATE 10 MG/ML IJ SOLN
8.0000 mg | Freq: Once | INTRAMUSCULAR | Status: AC
  Administered 2019-08-07: 8 mg via INTRAVENOUS

## 2019-08-07 MED ORDER — METHOCARBAMOL 500 MG IVPB - SIMPLE MED
INTRAVENOUS | Status: AC
  Administered 2019-08-07: 500 mg
  Filled 2019-08-07: qty 50

## 2019-08-07 MED ORDER — FENTANYL CITRATE (PF) 100 MCG/2ML IJ SOLN
INTRAMUSCULAR | Status: AC
  Filled 2019-08-07: qty 2

## 2019-08-07 MED ORDER — PHENYLEPHRINE 40 MCG/ML (10ML) SYRINGE FOR IV PUSH (FOR BLOOD PRESSURE SUPPORT)
PREFILLED_SYRINGE | INTRAVENOUS | Status: DC | PRN
  Administered 2019-08-07: 80 ug via INTRAVENOUS

## 2019-08-07 MED ORDER — METOCLOPRAMIDE HCL 5 MG PO TABS: 5.0000 mg | ORAL_TABLET | Freq: Three times a day (TID) | ORAL | Status: DC | PRN

## 2019-08-07 MED ORDER — LACTATED RINGERS IV SOLN: INTRAVENOUS | Status: DC

## 2019-08-07 MED ORDER — ROCURONIUM BROMIDE 10 MG/ML (PF) SYRINGE
PREFILLED_SYRINGE | INTRAVENOUS | Status: DC | PRN
Start: 2019-08-07 — End: 2019-08-07
  Administered 2019-08-07 (×2): 10 mg via INTRAVENOUS

## 2019-08-07 MED ORDER — HYDROCODONE-ACETAMINOPHEN 7.5-325 MG PO TABS: 1.0000 | ORAL_TABLET | ORAL | Status: DC | PRN

## 2019-08-07 MED ORDER — EZETIMIBE 10 MG PO TABS
10.0000 mg | ORAL_TABLET | Freq: Every day | ORAL | Status: DC
  Administered 2019-08-08: 10 mg via ORAL
  Filled 2019-08-07: qty 1

## 2019-08-07 MED ORDER — PHENYLEPHRINE 40 MCG/ML (10ML) SYRINGE FOR IV PUSH (FOR BLOOD PRESSURE SUPPORT)
PREFILLED_SYRINGE | INTRAVENOUS | Status: AC
  Filled 2019-08-07: qty 10

## 2019-08-07 MED ORDER — DONEPEZIL HCL 10 MG PO TABS
5.0000 mg | ORAL_TABLET | Freq: Every day | ORAL | Status: DC
  Administered 2019-08-08: 5 mg via ORAL
  Filled 2019-08-07: qty 1

## 2019-08-07 MED ORDER — SERTRALINE HCL 25 MG PO TABS
37.5000 mg | ORAL_TABLET | ORAL | Status: DC
  Filled 2019-08-07: qty 2

## 2019-08-07 MED ORDER — ALPRAZOLAM 0.25 MG PO TABS
0.2500 mg | ORAL_TABLET | Freq: Every day | ORAL | Status: DC | PRN
  Administered 2019-08-07: 0.25 mg via ORAL
  Filled 2019-08-07: qty 1

## 2019-08-07 MED ORDER — DEXAMETHASONE SODIUM PHOSPHATE 10 MG/ML IJ SOLN
INTRAMUSCULAR | Status: AC
  Filled 2019-08-07: qty 1

## 2019-08-07 MED ORDER — LIDOCAINE 2% (20 MG/ML) 5 ML SYRINGE
INTRAMUSCULAR | Status: DC | PRN
  Administered 2019-08-07: 60 mg via INTRAVENOUS

## 2019-08-07 MED ORDER — SERTRALINE HCL 25 MG PO TABS
25.0000 mg | ORAL_TABLET | ORAL | Status: DC
  Administered 2019-08-08: 25 mg via ORAL
  Filled 2019-08-07: qty 1

## 2019-08-07 MED ORDER — MORPHINE SULFATE (PF) 2 MG/ML IV SOLN: 0.5000 mg | INTRAVENOUS | Status: DC | PRN

## 2019-08-07 MED ORDER — ASPIRIN EC 325 MG PO TBEC
325.0000 mg | DELAYED_RELEASE_TABLET | Freq: Two times a day (BID) | ORAL | Status: DC
  Administered 2019-08-08: 325 mg via ORAL
  Filled 2019-08-07: qty 1

## 2019-08-07 MED ORDER — PHENOL 1.4 % MT LIQD: 1.0000 | OROMUCOSAL | Status: DC | PRN

## 2019-08-07 MED ORDER — ONDANSETRON HCL 4 MG/2ML IJ SOLN: 4.0000 mg | Freq: Four times a day (QID) | INTRAMUSCULAR | Status: DC | PRN

## 2019-08-07 MED ORDER — FENTANYL CITRATE (PF) 100 MCG/2ML IJ SOLN
25.0000 ug | INTRAMUSCULAR | Status: DC | PRN
  Administered 2019-08-07 (×2): 50 ug via INTRAVENOUS

## 2019-08-07 MED ORDER — BUPIVACAINE-EPINEPHRINE 0.25% -1:200000 IJ SOLN
INTRAMUSCULAR | Status: DC | PRN
  Administered 2019-08-07: 30 mL

## 2019-08-07 MED ORDER — MAGNESIUM CITRATE PO SOLN: 1.0000 | Freq: Once | ORAL | Status: DC | PRN

## 2019-08-07 MED ORDER — ACETAMINOPHEN 325 MG PO TABS: 325.0000 mg | ORAL_TABLET | Freq: Four times a day (QID) | ORAL | Status: DC | PRN

## 2019-08-07 MED ORDER — BISACODYL 10 MG RE SUPP: 10.0000 mg | Freq: Every day | RECTAL | Status: DC | PRN

## 2019-08-07 MED ORDER — CHLORHEXIDINE GLUCONATE 0.12 % MT SOLN
15.0000 mL | Freq: Once | OROMUCOSAL | Status: AC
  Administered 2019-08-07: 15 mL via OROMUCOSAL

## 2019-08-07 MED ORDER — CEFAZOLIN SODIUM-DEXTROSE 2-4 GM/100ML-% IV SOLN
2.0000 g | INTRAVENOUS | Status: AC
  Administered 2019-08-07: 2 g via INTRAVENOUS
  Filled 2019-08-07: qty 100

## 2019-08-07 MED ORDER — STERILE WATER FOR IRRIGATION IR SOLN
Status: DC | PRN
  Administered 2019-08-07: 2000 mL

## 2019-08-07 MED ORDER — SODIUM CHLORIDE 0.9 % IV SOLN: INTRAVENOUS | Status: DC

## 2019-08-07 SURGICAL SUPPLY — 64 items
BAG DECANTER FOR FLEXI CONT (MISCELLANEOUS) ×2 IMPLANT
BAG ZIPLOCK 12X15 (MISCELLANEOUS) ×4 IMPLANT
BIT DRILL 2.8X128 (BIT) ×2 IMPLANT
BLADE SAW SGTL 73X25 THK (BLADE) IMPLANT
CLSR STERI-STRIP ANTIMIC 1/2X4 (GAUZE/BANDAGES/DRESSINGS) ×2 IMPLANT
COVER SURGICAL LIGHT HANDLE (MISCELLANEOUS) ×2 IMPLANT
COVER WAND RF STERILE (DRAPES) IMPLANT
CUP ACETAB PIN MULTI 54MM (Orthopedic Implant) ×1 IMPLANT
DRAPE INCISE IOBAN 66X45 STRL (DRAPES) ×2 IMPLANT
DRAPE ORTHO SPLIT 77X108 STRL (DRAPES) ×2
DRAPE POUCH INSTRU U-SHP 10X18 (DRAPES) ×2 IMPLANT
DRAPE SURG ORHT 6 SPLT 77X108 (DRAPES) ×2 IMPLANT
DRAPE U-SHAPE 47X51 STRL (DRAPES) ×2 IMPLANT
DRSG AQUACEL AG ADV 3.5X10 (GAUZE/BANDAGES/DRESSINGS) ×1 IMPLANT
DRSG EMULSION OIL 3X16 NADH (GAUZE/BANDAGES/DRESSINGS) ×2 IMPLANT
DRSG MEPILEX BORDER 4X4 (GAUZE/BANDAGES/DRESSINGS) ×4 IMPLANT
DRSG MEPILEX BORDER 4X8 (GAUZE/BANDAGES/DRESSINGS) ×2 IMPLANT
DURAPREP 26ML APPLICATOR (WOUND CARE) ×2 IMPLANT
ELECT REM PT RETURN 15FT ADLT (MISCELLANEOUS) ×2 IMPLANT
EVACUATOR 1/8 PVC DRAIN (DRAIN) ×2 IMPLANT
FACESHIELD WRAPAROUND (MASK) ×8 IMPLANT
FACESHIELD WRAPAROUND OR TEAM (MASK) ×4 IMPLANT
GAUZE SPONGE 4X4 12PLY STRL (GAUZE/BANDAGES/DRESSINGS) ×2 IMPLANT
GLOVE BIO SURGEON STRL SZ7 (GLOVE) ×2 IMPLANT
GLOVE BIO SURGEON STRL SZ8 (GLOVE) ×2 IMPLANT
GLOVE BIOGEL PI IND STRL 7.0 (GLOVE) ×1 IMPLANT
GLOVE BIOGEL PI IND STRL 8 (GLOVE) ×1 IMPLANT
GLOVE BIOGEL PI INDICATOR 7.0 (GLOVE) ×1
GLOVE BIOGEL PI INDICATOR 8 (GLOVE) ×1
GOWN STRL REUS W/TWL LRG LVL3 (GOWN DISPOSABLE) ×4 IMPLANT
HANDPIECE INTERPULSE COAX TIP (DISPOSABLE)
HEAD FEM SZ XXL 36 +10.5 (Miscellaneous) ×1 IMPLANT
IMMOBILIZER KNEE 20 (SOFTGOODS) ×2
IMMOBILIZER KNEE 20 THIGH 36 (SOFTGOODS) IMPLANT
KIT BASIN (CUSTOM PROCEDURE TRAY) ×2 IMPLANT
KIT TURNOVER KIT A (KITS) IMPLANT
LINER MARATHON NEUT +4X54X36 (Hips) ×1 IMPLANT
MANIFOLD NEPTUNE II (INSTRUMENTS) ×2 IMPLANT
NDL SAFETY ECLIPSE 18X1.5 (NEEDLE) ×1 IMPLANT
NEEDLE HYPO 18GX1.5 SHARP (NEEDLE) ×1
NS IRRIG 1000ML POUR BTL (IV SOLUTION) ×2 IMPLANT
PACK TOTAL JOINT (CUSTOM PROCEDURE TRAY) ×2 IMPLANT
PASSER SUT SWANSON 36MM LOOP (INSTRUMENTS) ×1 IMPLANT
PENCIL SMOKE EVACUATOR COATED (MISCELLANEOUS) ×2 IMPLANT
PROTECTOR NERVE ULNAR (MISCELLANEOUS) ×2 IMPLANT
SCREW 6.5MMX25MM (Screw) ×2 IMPLANT
SCREW 6.5MMX30MM (Screw) ×1 IMPLANT
SET HNDPC FAN SPRY TIP SCT (DISPOSABLE) IMPLANT
SPONGE LAP 18X18 RF (DISPOSABLE) IMPLANT
STAPLER VISISTAT 35W (STAPLE) IMPLANT
SUCTION FRAZIER HANDLE 12FR (TUBING) ×1
SUCTION TUBE FRAZIER 12FR DISP (TUBING) ×1 IMPLANT
SUT ETHIBOND NAB CT1 #1 30IN (SUTURE) ×4 IMPLANT
SUT STRATAFIX 0 PDS 27 VIOLET (SUTURE) ×2
SUT VIC AB 2-0 CT1 27 (SUTURE) ×3
SUT VIC AB 2-0 CT1 TAPERPNT 27 (SUTURE) ×3 IMPLANT
SUTURE STRATFX 0 PDS 27 VIOLET (SUTURE) ×1 IMPLANT
SWAB COLLECTION DEVICE MRSA (MISCELLANEOUS) IMPLANT
SWAB CULTURE ESWAB REG 1ML (MISCELLANEOUS) IMPLANT
SYR 50ML LL SCALE MARK (SYRINGE) ×2 IMPLANT
TOWEL OR 17X26 10 PK STRL BLUE (TOWEL DISPOSABLE) ×4 IMPLANT
TRAY FOLEY MTR SLVR 16FR STAT (SET/KITS/TRAYS/PACK) ×1 IMPLANT
WATER STERILE IRR 1000ML POUR (IV SOLUTION) ×4 IMPLANT
YANKAUER SUCT BULB TIP 10FT TU (MISCELLANEOUS) ×2 IMPLANT

## 2019-08-07 NOTE — Transfer of Care (Signed)
Immediate Anesthesia Transfer of Care Note  Patient: Kristin Kramer  Procedure(s) Performed: Right hip acetabular versus total hip arthroplasty revision-posterior (Right Hip)  Patient Location: PACU  Anesthesia Type:General  Level of Consciousness: sedated  Airway & Oxygen Therapy: Patient Spontanous Breathing and Patient connected to face mask oxygen  Post-op Assessment: Report given to RN and Post -op Vital signs reviewed and stable  Post vital signs: Reviewed and stable  Last Vitals:  Vitals Value Taken Time  BP 115/58 08/07/19 1332  Temp    Pulse 58 08/07/19 1333  Resp 16 08/07/19 1333  SpO2 97 % 08/07/19 1333  Vitals shown include unvalidated device data.  Last Pain:  Vitals:   08/07/19 1003  TempSrc:   PainSc: 0-No pain         Complications: No apparent anesthesia complications

## 2019-08-07 NOTE — Op Note (Signed)
Kristin Kramer, LUCHETTI [facility] MEDICAL RECORD BY:[id number] ACCOUNT [id number] DATE OF BIRTH:[birth year] FACILITY: [facility] LOCATION: [facility] PHYSICIAN:Merlin Golden Zella Ball, MD  OPERATIVE REPORT  DATE OF PROCEDURE:  08/07/2019  PREOPERATIVE DIAGNOSIS:  Failed right total hip arthroplasty.  POSTOPERATIVE DIAGNOSIS:  Failed right total hip arthroplasty.  PROCEDURE:  Right hip acetabular revision.  SURGEON:  Gaynelle Arabian, MD  ASSISTANT:  Griffith Citron, PA-C  ANESTHESIA:  General.  ESTIMATED BLOOD LOSS:  500 mL.  DRAINS:  None.  COMPLICATIONS:  None.  CONDITION:  Stable to recovery.  BRIEF CLINICAL NOTE:  The patient is a 79 year old female who had a right total hip arthroplasty done in [place] approximately a year and a half ago and has had pain in the hip since.  She also has a popping sensation when she ambulates.  Radiographs  showed that she has a dual mobility acetabular component, but that the component is adducted and retroverted.  She has not had any dislocation episodes.  She presents now for acetabular versus total hip revision.  PROCEDURE IN DETAIL:  After successful administration of general anesthesia, the patient was placed in the left lateral decubitus position with the right side up and held with a hip positioner.  Right lower extremity was isolated from her perineum with  plastic drapes and prepped and draped in the usual sterile fashion.  The previous posterolateral incisions were utilized.  Skin cut with a 10 blade through subcutaneous tissue to the level of the fascia lata, which was incised in line with the skin  incision.  The sciatic nerve was palpated and protected.  The pseudocapsule is incised off the posterior aspect of the greater trochanter.  The joint was identified.  There was metal staining in the tissues around the joint.  I removed this membrane back  to normal tissue.  The cup was retroverted about 25-30 degrees and adduct.  There was impingement between  the femoral neck and the acetabular component, but fortunately there was no damage to the femoral neck.  I was able to dislocate the hip and remove  the femoral head as well as the polyethylene dual mobility head.  The femur was then retracted anteriorly to gain acetabular exposure.  Acetabular retractors were placed.  Using osteotomes and the [facility] revision osteotomes, the cup was removed with  minimal to no bone loss.  There was a significant defect in the anterior wall.  The cup removed was 50 mm and reaming started at 49.  We went up to 53 mm and then a 54 mm Pinnacle multihole acetabular shell was impacted into anatomic position with  excellent purchase.  I placed 3 additional dome screws to protect the fixation.  Each of the dome screws also had excellent fixation.  The position was checked with the Ch[place]ley acetabular positioner and was found to be in about 45-50 degrees of  abduction and 10-20 degrees of anteversion, which matches her native anatomy.  The 36 mm neutral +4 Marathon liner was then placed into the acetabular shell.  Femoral component was inspected and was well fixed and in good position.  We did femoral head trials, starting at a 36.5, then coursing up to eventually a 36+10.  This was the maximum head size for the Medacta femoral stem.  With the +10, the hip was  reduced with excellent stability.  She had full extension, full external rotation, 70 degrees flexion, 40 degrees adduction, 90 degrees internal rotation, 90 degrees of flexion, 70 degrees of internal rotation.  By placing the  right leg on top of the  left, it felt as though the leg lengths were now equalized.  The hip was dislocated and the trial femoral head was removed.  The permanent 36+10 metal femoral head was then placed onto the Medacta femoral stem.  The hip was reduced with excellent  stability.  The wound was then copiously irrigated with saline solution.  The posterior structures were reattached to the femur  through drill holes with Ethibond suture.  Additional layers were closed back to the femur for stability.  Thirty mL of 0.25%  Marcaine with epinephrine were then injected into the subcutaneous tissues.  The fascia lata was closed with a running 0 Stratafix suture after the wound was copiously irrigated with saline solution.  Subcutaneous was then closed with interrupted 2-0  Vicryl, subcuticular running 4-0 Monocryl.  Incisions were cleaned and dried and Steri-Strips and a bulky sterile dressing applied.  She was placed into a knee immobilizer, awakened and transported to recovery in stable condition.  Please note that a  surgical assistant was a medical necessity for this procedure.  Assistance was necessary for retraction of vital neurovascular structures and for proper positioning in the limb for safe removal of the old implant and safe and accurate placement of the  new implant.  VN/NUANCE  D:08/07/2019 T:08/07/2019 JOB:[id number]

## 2019-08-07 NOTE — Brief Op Note (Signed)
08/07/2019  1:13 PM  PATIENT:  Kristin Kramer  79 y.o. female  PRE-OPERATIVE DIAGNOSIS:  Painful right total hip arthroplasty  POST-OPERATIVE DIAGNOSIS:  Painful right total hip arthroplasty  PROCEDURE:  Procedure(s) with comments: Right hip acetabular versus total hip arthroplasty revision-posterior (Right) - 1106min  SURGEON:  Surgeon(s) and Role:    Gaynelle Arabian, MD - Primary  PHYSICIAN ASSISTANT:   ASSISTANTS: Griffith Citron, PA-C   ANESTHESIA:   general  EBL:  500 mL   BLOOD ADMINISTERED:none  DRAINS: none   LOCAL MEDICATIONS USED:  MARCAINE     COUNTS:  YES  TOURNIQUET:  * No tourniquets in log *  DICTATION: .Other Dictation: Dictation Number [id number]  PLAN OF CARE: Admit to inpatient   PATIENT DISPOSITION:  PACU - hemodynamically stable.

## 2019-08-07 NOTE — Evaluation (Signed)
Physical Therapy Evaluation Patient Details Name: Kristin Kramer MRN: [id number] DOB: Jun 02, 1940 Today's Date: 08/07/2019   History of Present Illness  Patient is 79 y.o. female s/p Rt THR (acetabular revision) on 08/07/19 with PMH significant for osteopenia, migraines, OA, bil TKA, HLD, hypothtyroidism, Rt hip fracture in 2019 with with IM nail on 07/08/17 and later conversion to THA on 12/07/17.     Clinical Impression  Kristin Kramer is a 79 y.o. female POD 0 s/p Rt THR (acetabular revision). Patient reports independence with [facility] for mobility at baseline. Patient is now limited by functional impairments (see PT problem list below) and requires min assist for transfers and gait with RW. Patient was able to ambulate ~30 feet with RW and min assist. Patient educated on posterior hip precautions and cues provided throughout mobility to maintain. Patient instructed in exercise to facilitate ROM and circulation. Patient will benefit from continued skilled PT interventions to address impairments and progress towards PLOF. Acute PT will follow to progress mobility and stair training in preparation for safe discharge home.     Follow Up Recommendations Follow surgeons recommendation for DC plan and follow-up therapies    Equipment Recommendations  None recommended by PT    Recommendations for Other Services       Precautions / Restrictions Precautions Precautions: Fall;Posterior Hip Precaution Booklet Issued: Yes (comment) Required Braces or Orthoses: Knee Immobilizer - Right Restrictions Weight Bearing Restrictions: No Other Position/Activity Restrictions: WBAT      Mobility  Bed Mobility Overal bed mobility: Needs Assistance Bed Mobility: Supine to Sit     Supine to sit: Min assist;HOB elevated     General bed mobility comments: cues to maintain posterior hip precautions and assist for Rt LE mobility to bring to EOB. Assist to raise trunk fully.   Transfers Overall transfer  level: Needs assistance Equipment used: Rolling walker (2 wheeled) Transfers: Sit to/from Stand Sit to Stand: Min assist;From elevated surface         General transfer comment: cues for safe technique with RW and to maintain hip precautions. assist to complete power up and steady with rising. VC/TC for safe reach back to chair to sit.  Ambulation/Gait Ambulation/Gait assistance: Min assist Gait Distance (Feet): 30 Feet Assistive device: Rolling walker (2 wheeled) Gait Pattern/deviations: Step-to pattern;Decreased stride length;Decreased stance time - right;Decreased step length - left;Decreased weight shift to right;Antalgic Gait velocity: decreased   General Gait Details: cues for step pattern and use of UE's to reduce pressure on Rt LE. assist intermittently for walker positioning.   Stairs            Wheelchair Mobility    Modified Rankin (Stroke Patients Only)       Balance Overall balance assessment: Needs assistance Sitting-balance support: Feet supported Sitting balance-Leahy Scale: Good     Standing balance support: During functional activity;Bilateral upper extremity supported Standing balance-Leahy Scale: Poor                  Pertinent Vitals/Pain Pain Assessment: 0-10 Pain Score: 2  Pain Location: Rt hip Pain Descriptors / Indicators: Aching;Discomfort Pain Intervention(s): Limited activity within patient's tolerance;Monitored during session;Repositioned;Ice applied    Home Living Family/patient expects to be discharged to:: Private residence Living Arrangements: Alone Available Help at Discharge: Family Type of Home: House Home Access: Stairs to enter Entrance Stairs-Rails: None Entrance Stairs-Number of Steps: 1 Home Layout: One level Home Equipment: Environmental consultant - 2 wheels;Walker - 4 wheels;Cane - single point;Bedside commode;Shower seat - built  in Additional Comments: pt's son will stay with her until Sunday and return for the following  weekend. friends will help between then.    Prior Function Level of Independence: Independent with assistive device(s)         Comments: pt using SPC for mobility prior to surgery.     Hand Dominance   Dominant Hand: Right    Extremity/Trunk Assessment   Upper Extremity Assessment Upper Extremity Assessment: Overall WFL for tasks assessed    Lower Extremity Assessment Lower Extremity Assessment: Overall WFL for tasks assessed;RLE deficits/detail RLE Deficits / Details: pt able to complete dorsi/plantarflexion RLE: Unable to fully assess due to immobilization;Unable to fully assess due to pain RLE Sensation: WNL RLE Coordination: WNL    Cervical / Trunk Assessment Cervical / Trunk Assessment: Normal  Communication   Communication: No difficulties  Cognition Arousal/Alertness: Awake/alert Behavior During Therapy: WFL for tasks assessed/performed Overall Cognitive Status: Within Functional Limits for tasks assessed         General Comments      Exercises Total Joint Exercises Ankle Circles/Pumps: AROM;Both;20 reps;Supine   Assessment/Plan    PT Assessment Patient needs continued PT services  PT Problem List Decreased strength;Decreased activity tolerance;Decreased range of motion;Decreased balance;Decreased mobility;Decreased knowledge of use of DME;Decreased knowledge of precautions;Pain       PT Treatment Interventions Gait training;DME instruction;Stair training;Functional mobility training;Therapeutic activities;Therapeutic exercise;Balance training;Patient/family education    PT Goals (Current goals can be found in the Care Plan section)  Acute Rehab PT Goals Patient Stated Goal: recover and get back to independence PT Goal Formulation: With patient Time For Goal Achievement: 08/14/19 Potential to Achieve Goals: Good    Frequency 7X/week    AM-PAC PT "6 Clicks" Mobility  Outcome Measure Help needed turning from your back to your side while in a  flat bed without using bedrails?: A Little Help needed moving from lying on your back to sitting on the side of a flat bed without using bedrails?: A Little Help needed moving to and from a bed to a chair (including a wheelchair)?: A Little Help needed standing up from a chair using your arms (e.g., wheelchair or bedside chair)?: A Little Help needed to walk in hospital room?: A Little Help needed climbing 3-5 steps with a railing? : A Lot 6 Click Score: 17    End of Session Equipment Utilized During Treatment: Gait belt;Right knee immobilizer Activity Tolerance: Patient tolerated treatment well Patient left: in chair;with call bell/phone within reach;with chair alarm set;with family/visitor present Nurse Communication: Mobility status PT Visit Diagnosis: Muscle weakness (generalized) (M62.81);Difficulty in walking, not elsewhere classified (R26.2)    Time: [id number] PT Time Calculation (min) (ACUTE ONLY): 21 min   Charges:   PT Evaluation $PT Eval Low Complexity: 1 Low        Verner Mould, DPT Physical Therapist with [facility] [phone]  08/07/2019 5:47 PM

## 2019-08-07 NOTE — Anesthesia Postprocedure Evaluation (Signed)
Anesthesia Post Note  Patient: Kristin Kramer  Procedure(s) Performed: Right hip acetabular versus total hip arthroplasty revision-posterior (Right Hip)     Patient location during evaluation: PACU Anesthesia Type: General Level of consciousness: awake and alert Pain management: pain level controlled Vital Signs Assessment: post-procedure vital signs reviewed and stable Respiratory status: spontaneous breathing, nonlabored ventilation, respiratory function stable and patient connected to nasal cannula oxygen Cardiovascular status: blood pressure returned to baseline and stable Postop Assessment: no apparent nausea or vomiting Anesthetic complications: no    Last Vitals:  Vitals:   08/07/19 1515 08/07/19 1543  BP: (!) 119/59 119/60  Pulse: 60 67  Resp: 13 16  Temp: 36.9 C 36.7 C  SpO2: 93% 99%    Last Pain:  Vitals:   08/07/19 1602  TempSrc:   PainSc: 8                  Tiajuana Amass

## 2019-08-07 NOTE — Anesthesia Procedure Notes (Signed)
Procedure Name: Intubation Date/Time: 08/07/2019 11:23 AM Performed by: Talbot Grumbling, CRNA Pre-anesthesia Checklist: Patient identified, Emergency Drugs available, Suction available and Patient being monitored Patient Re-evaluated:Patient Re-evaluated prior to induction Oxygen Delivery Method: Circle system utilized Preoxygenation: Pre-oxygenation with 100% oxygen Induction Type: IV induction Ventilation: Mask ventilation without difficulty Laryngoscope Size: 3 and Mac Grade View: Grade II Tube type: Oral Tube size: 7.0 mm Number of attempts: 1 Airway Equipment and Method: Stylet Placement Confirmation: ETT inserted through vocal cords under direct vision,  positive ETCO2 and breath sounds checked- equal and bilateral Secured at: 21 cm Tube secured with: Tape Dental Injury: Teeth and Oropharynx as per pre-operative assessment

## 2019-08-07 NOTE — Interval H&P Note (Signed)
History and Physical Interval Note:  08/07/2019 9:52 AM  Kristin Kramer  has presented today for surgery, with the diagnosis of Painful right total hip arthroplasty.  The various methods of treatment have been discussed with the patient and family. After consideration of risks, benefits and other options for treatment, the patient has consented to  Procedure(s) with comments: Right hip acetabular versus total hip arthroplasty revision-posterior (Right) - 120min as a surgical intervention.  The patient's history has been reviewed, patient examined, no change in status, stable for surgery.  I have reviewed the patient's chart and labs.  Questions were answered to the patient's satisfaction.     Pilar Plate Riyah Bardon

## 2019-08-07 NOTE — Anesthesia Preprocedure Evaluation (Signed)
Anesthesia Evaluation  Patient identified by MRN, date of birth, ID band Patient awake    Reviewed: Allergy & Precautions, H&P , NPO status , Patient's Chart, lab work & pertinent test results  History of Anesthesia Complications (+) PONV and history of anesthetic complications  Airway Mallampati: III  TM Distance: >3 FB     Dental  (+) Teeth Intact, Caps   Pulmonary neg pulmonary ROS,    Pulmonary exam normal        Cardiovascular Exercise Tolerance: Good negative cardio ROS Normal cardiovascular exam     Neuro/Psych PSYCHIATRIC DISORDERS (Depression) Anxiety Depression negative neurological ROS     GI/Hepatic Neg liver ROS, GERD  Medicated and Controlled,  Endo/Other  negative endocrine ROS  Renal/GU negative Renal ROS  negative genitourinary   Musculoskeletal   Abdominal   Peds  Hematology  (+) REFUSES BLOOD PRODUCTS, JEHOVAH'S WITNESS  Anesthesia Other Findings  12/2011 Echo: EF               55-60%, no rwma, pasp 28mmHg.    Reproductive/Obstetrics negative OB ROS                             Lab Results  Component Value Date   WBC 5.2 07/29/2019   HGB 14.1 07/29/2019   HCT 44.5 07/29/2019   MCV 92.1 07/29/2019   PLT 218 07/29/2019   Lab Results  Component Value Date   CREATININE 0.61 07/29/2019   BUN 15 07/29/2019   NA 146 (H) 07/29/2019   K 4.5 07/29/2019   CL 106 07/29/2019   CO2 29 07/29/2019    Anesthesia Physical  Anesthesia Plan  ASA: II  Anesthesia Plan: Spinal   Post-op Pain Management:    Induction: Intravenous  PONV Risk Score and Plan: 3 and Propofol infusion, Dexamethasone, Ondansetron and Treatment may vary due to age or medical condition  Airway Management Planned: Simple Face Mask and Natural Airway  Additional Equipment:   Intra-op Plan:   Post-operative Plan:   Informed Consent: I have reviewed the patients History and Physical,  chart, labs and discussed the procedure including the risks, benefits and alternatives for the proposed anesthesia with the patient or authorized representative who has indicated his/her understanding and acceptance.     Dental advisory given  Plan Discussed with: CRNA and Anesthesiologist  Anesthesia Plan Comments:         Anesthesia Quick Evaluation

## 2019-08-07 NOTE — Discharge Instructions (Addendum)
Dr. Gaynelle Arabian Total Joint Specialist [facility] [address], [place], [place] [address] [phone]  POSTERIOR TOTAL HIP REVISION POSTOPERATIVE DIRECTIONS  Hip Rehabilitation, Guidelines Following Surgery  The results of a hip operation are greatly improved after range of motion and muscle strengthening exercises. Follow all safety measures which are given to protect your hip. If any of these exercises cause increased pain or swelling in your joint, decrease the amount until you are comfortable again. Then slowly increase the exercises. Call your caregiver if you have problems or questions.   BLOOD CLOT PREVENTION . Take a 325 mg Aspirin two times a day for three weeks following surgery. Then take an 81 mg Aspirin once a day for three weeks. Then discontinue Aspirin. Dennis Bast may resume your vitamins/supplements upon discharge from the hospital. . Do not take any NSAIDs (Advil, Aleve, Ibuprofen, Meloxicam, etc.) until you have discontinued the 325 mg Aspirin.  PRECAUTIONS (6 WEEKS FOLLOWING SURGERY) . Do not bend your hip past a 90 degree angle . Do not cross your legs. . Don't twist your hip inwards- keep knees and toes pointed upwards   HOME CARE INSTRUCTIONS  . Remove items at home which could result in a fall. This includes throw rugs or furniture in walking pathways.   ICE to the affected hip every three hours for 30 minutes at a time and then as needed for pain and swelling.  Continue to use ice on the hip for pain and swelling from surgery. You may notice swelling that will progress down to the foot and ankle.  This is normal after surgery.  Elevate the leg when you are not up walking on it.    Continue to use the breathing machine which will help keep your temperature down.  It is common for your temperature to cycle up and down following surgery, especially at night when you are not up moving around and exerting yourself.  The breathing machine keeps your  lungs expanded and your temperature down.  DIET You may resume your previous home diet once your are discharged from the hospital.  DRESSING / WOUND CARE / SHOWERING You have an adhesive waterproof bandage across the incision. Leave this in place until your first follow-up appointment.  You may shower three days following surgery. You do not need to cover the bandage, it is waterproof. Do not submerge the incision under water until cleared by your physician.  ACTIVITY Walk with your walker as instructed. Use walker as long as suggested by your caregivers. Avoid periods of inactivity such as sitting longer than an hour when not asleep. This helps prevent blood clots.  You may resume a sexual relationship in one month or when given the OK by your doctor.  You may return to work once you are cleared by your doctor.  Do not drive a car for 6 weeks or until released by you surgeon.  Do not drive while taking narcotics.  WEIGHT BEARING Weight bearing as tolerated with assist device (walker, cane, etc) as directed, use it as long as suggested by your surgeon or therapist, typically at least 4-6 weeks.  POSTOPERATIVE CONSTIPATION PROTOCOL Constipation - defined medically as fewer than three stools per week and severe constipation as less than one stool per week.  One of the most common issues patients have following surgery is constipation.  Even if you have a regular bowel pattern at home, your normal regimen is likely to be disrupted due to multiple reasons  following surgery.  Combination of anesthesia, postoperative narcotics, change in appetite and fluid intake all can affect your bowels.  In order to avoid complications following surgery, here are some recommendations in order to help you during your recovery period.  Colace (docusate) - Pick up an over-the-counter form of Colace or another stool softener and take twice a day as long as you are requiring postoperative pain medications.  Take  with a full glass of water daily.  If you experience loose stools or diarrhea, hold the colace until you stool forms back up.  If your symptoms do not get better within 1 week or if they get worse, check with your doctor.  Dulcolax (bisacodyl) - Pick up over-the-counter and take as directed by the product packaging as needed to assist with the movement of your bowels.  Take with a full glass of water.  Use this product as needed if not relieved by Colace only.   MiraLax (polyethylene glycol) - Pick up over-the-counter to have on hand.  MiraLax is a solution that will increase the amount of water in your bowels to assist with bowel movements.  Take as directed and can mix with a glass of water, juice, soda, coffee, or tea.  Take if you go more than two days without a movement. Do not use MiraLax more than once per day. Call your doctor if you are still constipated or irregular after using this medication for 7 days in a row.  If you continue to have problems with postoperative constipation, please contact the office for further assistance and recommendations.  If you experience "the worst abdominal pain ever" or develop nausea or vomiting, please contact the office immediatly for further recommendations for treatment.  ITCHING  If you experience itching with your medications, try taking only a single pain pill, or even half a pain pill at a time.  You can also use Benadryl over the counter for itching or also to help with sleep.   TED HOSE STOCKINGS Wear the elastic stockings on both legs for three weeks following surgery during the day but you may remove then at night for sleeping.  MEDICATIONS See your medication summary on the "After Visit Summary" that the nursing staff will review with you prior to discharge.  You may have some home medications which will be placed on hold until you complete the course of blood thinner medication.  It is important for you to complete the blood thinner medication  as prescribed by your surgeon.  Continue your approved medications as instructed at time of discharge.  PRECAUTIONS If you experience chest pain or shortness of breath - call 911 immediately for transfer to the hospital emergency department.  If you develop a fever greater that 101 F, purulent drainage from wound, increased redness or drainage from wound, foul odor from the wound/dressing, or calf pain - CONTACT YOUR SURGEON.                                                   FOLLOW-UP APPOINTMENTS Make sure you keep all of your appointments after your operation with your surgeon and caregivers. You should call the office at the above phone number and make an appointment for approximately two weeks after the date of your surgery or on the date instructed by your surgeon outlined in the "After Visit Summary".  RANGE OF MOTION AND STRENGTHENING EXERCISES  These exercises are designed to help you keep full movement of your hip joint. Follow your caregiver's or physical therapist's instructions. Perform all exercises about fifteen times, three times per day or as directed. Exercise both hips, even if you have had only one joint replacement. These exercises can be done on a training (exercise) mat, on the floor, on a table or on a bed. Use whatever works the best and is most comfortable for you. Use music or television while you are exercising so that the exercises are a pleasant break in your day. This will make your life better with the exercises acting as a break in routine you can look forward to.  . Lying on your back, slowly slide your foot toward your buttocks, raising your knee up off the floor. Then slowly slide your foot back down until your leg is straight again.  . Lying on your back spread your legs as far apart as you can without causing discomfort.  . Lying on your side, raise your upper leg and foot straight up from the floor as far as is comfortable. Slowly lower the leg and repeat.  . Lying  on your back, tighten up the muscle in the front of your thigh (quadriceps muscles). You can do this by keeping your leg straight and trying to raise your heel off the floor. This helps strengthen the largest muscle supporting your knee.  . Lying on your back, tighten up the muscles of your buttocks both with the legs straight and with the knee bent at a comfortable angle while keeping your heel on the floor.   IF YOU ARE TRANSFERRED TO A SKILLED REHAB FACILITY If the patient is transferred to a skilled rehab facility following release from the hospital, a list of the current medications will be sent to the facility for the patient to continue.  When discharged from the skilled rehab facility, please have the facility set up the patient's [place] prior to being released. Also, the skilled facility will be responsible for providing the patient with their medications at time of release from the facility to include their pain medication, the muscle relaxants, and their blood thinner medication. If the patient is still at the rehab facility at time of the two week follow up appointment, the skilled rehab facility will also need to assist the patient in arranging follow up appointment in our office and any transportation needs.  MAKE SURE YOU:  . Understand these instructions.  . Get help right away if you are not doing well or get worse.    Pick up stool softner and laxative for home use following surgery while on pain medications. Do not submerge incision under water. Please use good hand washing techniques while changing dressing each day. May shower starting three days after surgery. Please use a clean towel to pat the incision dry following showers. Continue to use ice for pain and swelling after surgery. Do not use any lotions or creams on the incision until instructed by your surgeon.

## 2019-08-08 ENCOUNTER — Encounter: Payer: Self-pay | Admitting: *Deleted

## 2019-08-08 LAB — BASIC METABOLIC PANEL
Anion gap: 8 (ref 5–15)
BUN: 13 mg/dL (ref 8–23)
CO2: 27 mmol/L (ref 22–32)
Calcium: 8.3 mg/dL — ABNORMAL LOW (ref 8.9–10.3)
Chloride: 105 mmol/L (ref 98–111)
Creatinine, Ser: 0.44 mg/dL (ref 0.44–1.00)
GFR calc Af Amer: >60 mL/min (ref >60)
GFR calc non Af Amer: >60 mL/min (ref >60)
Glucose, Bld: 158 mg/dL — ABNORMAL HIGH (ref 70–99)
Potassium: 3.8 mmol/L (ref 3.5–5.1)
Sodium: 140 mmol/L (ref 135–145)

## 2019-08-08 LAB — CBC
HCT: 32.7 % — ABNORMAL LOW (ref 36.0–46.0)
Hemoglobin: 10.4 g/dL — ABNORMAL LOW (ref 12.0–15.0)
MCH: 29.1 pg (ref 26.0–34.0)
MCHC: 31.8 g/dL (ref 30.0–36.0)
MCV: 91.6 fL (ref 80.0–100.0)
Platelets: 185 10*3/uL (ref 150–400)
RBC: 3.57 MIL/uL — ABNORMAL LOW (ref 3.87–5.11)
RDW: 12.7 % (ref 11.5–15.5)
WBC: 8.3 10*3/uL (ref 4.0–10.5)
nRBC: 0 % (ref 0.0–0.2)

## 2019-08-08 MED ORDER — ASPIRIN 325 MG PO TBEC: 325.0000 mg | DELAYED_RELEASE_TABLET | Freq: Two times a day (BID) | ORAL | 0 refills | Status: AC

## 2019-08-08 MED ORDER — METHOCARBAMOL 500 MG PO TABS: 500.0000 mg | ORAL_TABLET | Freq: Four times a day (QID) | ORAL | 0 refills | Status: DC | PRN

## 2019-08-08 MED ORDER — HYDROCODONE-ACETAMINOPHEN 5-325 MG PO TABS: 1.0000 | ORAL_TABLET | Freq: Four times a day (QID) | ORAL | 0 refills | Status: DC | PRN

## 2019-08-08 NOTE — Progress Notes (Signed)
Subjective: 1 Day Post-Op Procedure(s) (LRB): Right hip acetabular versus total hip arthroplasty revision-posterior (Right) Patient reports pain as mild.   Patient seen in rounds by Dr. Wynelle Link. Patient is well, and has had no acute complaints or problems other than discomfort in the right hip. Voiding without difficulty, positive flatus. Denies CP, SHOB, N/V.  We will continue therapy today.   Objective: Vital signs in last 24 hours: Temp:  [97.4 F (36.3 C)-98.8 F (37.1 C)] 97.7 F (36.5 C) (06/03 0601) Pulse Rate:  [56-72] 57 (06/03 0601) Resp:  [12-18] 15 (06/03 0601) BP: (102-156)/(54-76) 109/63 (06/03 0601) SpO2:  [91 %-100 %] 100 % (06/03 0601) Weight:  [68 kg] 68 kg (06/02 1003)  Intake/Output from previous day:  Intake/Output Summary (Last 24 hours) at 08/08/2019 0944 Last data filed at 08/08/2019 0906 Gross per 24 hour  Intake 5318.58 ml  Output 2600 ml  Net 2718.58 ml     Intake/Output this shift: Total I/O In: 240 [P.O.:240] Out: -   Labs: Recent Labs    08/08/19 0240  HGB 10.4*   Recent Labs    08/08/19 0240  WBC 8.3  RBC 3.57*  HCT 32.7*  PLT 185   Recent Labs    08/08/19 0240  NA 140  K 3.8  CL 105  CO2 27  BUN 13  CREATININE 0.44  GLUCOSE 158*  CALCIUM 8.3*   No results for input(s): LABPT, INR in the last 72 hours.  Exam: General - Patient is Alert and Oriented Extremity - Neurologically intact Sensation intact distally Intact pulses distally Dorsiflexion/Plantar flexion intact Dressing - dressing C/D/I Motor Function - intact, moving foot and toes well on exam.   Past Medical History:  Diagnosis Date  . 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 Abnormal ECG    a. left axis deviation;  b. 12/2011 Echo: EF 55-60%, no rwma, pasp 72mmHg.  . Arthritis   . Closed right hip fracture ([place]) 07/07/2017  . Depression    prior on lexapro,  celexa, wellbutrin.  high cymbalta doses cause tremors  . GERD (gastroesophageal reflux disease)   . H/O seasonal allergies   . Herpes zoster 02/19/2014  . History of pneumonia   . History of UTI    12/2011 - Treatment began preoperatively with CIPRO 500mg  BID  . HLD (hyperlipidemia)    statin intolerance  . Hypothyroidism   . Knee osteoarthritis 2013, 2014   bilateral s/p B TKR [facility])  . Knee osteoarthritis   . 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 Migraines   . Osteopenia 08/2016   hip -2.4, spine -1.4  . Post-operative nausea and vomiting   . Pre-diabetes   . Refusal of blood transfusions as patient is Jehovah's Witness   . Seasonal allergies    cats, dust, mold, roaches  . 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 Isac Sarna), R tibia - then recurrent x2, L tibia, R tibia x3  . Tubular adenoma of colon 08/2014  . 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   Weight as of this encounter: 68 kg. Advance diet Up with therapy D/C IV fluids  DVT Prophylaxis - Aspirin Weight bearing as tolerated D/C knee immobilizer Hip precautions discussed with patient  Plan is to go Home after hospital stay. Plan for discharge today following 1 session of PT as long as she continues to meet her goals. We will arrange for HHPT. Follow up in the office in 2 weeks. Aquacel dressing to remain in place.   Griffith Citron, PA-C Orthopedic Surgery [phone] 08/08/2019, 9:44 AM

## 2019-08-08 NOTE — TOC Transition Note (Addendum)
Transition of Care [facility]) - CM/SW Discharge Note   Patient Details  Name: Zaraiah Burnard MRN: [id number] Date of Birth: 1940/09/07  [facility] CM/SW Contact:  Lia Hopping, [place] Phone Number: 08/08/2019, 10:47 AM   Clinical Narrative:    Patient requests to use the agency [facility] for [place]. She has used them in the past and pleased with their therapy.  CSW made a referral to [facility] and confirm ability to provide services. Information written on AVS.  [facility] will start of care 6/8. Patient agreeable and will work on HE until then.   Final next level of care: [place] Barriers to Discharge: Barriers Resolved   Patient Goals and CMS Choice Patient states their goals for this hospitalization and ongoing recovery are:: Physical Therapy at Home CMS Medicare.gov Compare Post Acute Care list provided to:: Patient Choice offered to / list presented to : Patient  Discharge Placement                       Discharge Plan and Services                DME Arranged: N/A DME Agency: NA       HH Arranged: PT [place] Agency: [facility] (formerly [employer]) Date [place]: 08/08/19 Time [place]: [id number] Representative spoke with at [place]: [place] ([place]) Interventions     Readmission Risk Interventions No flowsheet data found.

## 2019-08-08 NOTE — Progress Notes (Signed)
Physical Therapy Treatment Patient Details Name: Kristin Kramer MRN: [id number] DOB: [birth year] Today's Date: 08/08/2019    History of Present Illness Patient is 79 y.o. female s/p Rt THR (acetabular revision) on 08/07/19 with PMH significant for osteopenia, migraines, OA, bil TKA, HLD, hypothtyroidism, Rt hip fracture in 2019 with with IM nail on 07/08/17 and later conversion to THA on 12/07/17.     PT Comments    Pt motivated and progressing well with mobility.  Pt hopeful for dc home this pm.   Follow Up Recommendations  Follow surgeons recommendation for DC plan and follow-up therapies     Equipment Recommendations  None recommended by PT    Recommendations for Other Services       Precautions / Restrictions Precautions Precautions: Fall;Posterior Hip Precaution Booklet Issued: Yes (comment) Precaution Comments: Pt recalls 2/3 THP without cues Restrictions Weight Bearing Restrictions: No Other Position/Activity Restrictions: WBAT    Mobility  Bed Mobility Overal bed mobility: Needs Assistance Bed Mobility: Supine to Sit     Supine to sit: Min guard     General bed mobility comments: cues for sequence, adherence to THP and use of belt to self assist R LE  Transfers Overall transfer level: Needs assistance Equipment used: Rolling walker (2 wheeled) Transfers: Sit to/from Stand Sit to Stand: From elevated surface;Min guard         General transfer comment: cues for LE management and use of UEs to self assist  Ambulation/Gait Ambulation/Gait assistance: Min guard Gait Distance (Feet): 150 Feet Assistive device: Rolling walker (2 wheeled) Gait Pattern/deviations: Step-to pattern;Decreased stride length;Decreased stance time - right;Decreased step length - left;Decreased weight shift to right;Antalgic Gait velocity: decreased   General Gait Details: min cues for posture, position from RW and initial sequence   Stairs             Wheelchair  Mobility    Modified Rankin (Stroke Patients Only)       Balance Overall balance assessment: Needs assistance Sitting-balance support: Feet supported Sitting balance-Leahy Scale: Good     Standing balance support: During functional activity;Bilateral upper extremity supported Standing balance-Leahy Scale: Fair                              Cognition Arousal/Alertness: Awake/alert Behavior During Therapy: WFL for tasks assessed/performed Overall Cognitive Status: Within Functional Limits for tasks assessed                                        Exercises Total Joint Exercises Ankle Circles/Pumps: AROM;Both;20 reps;Supine Quad Sets: AROM;Both;10 reps;Supine Heel Slides: AAROM;Right;20 reps;Supine Hip ABduction/ADduction: AAROM;Right;15 reps;Supine    General Comments        Pertinent Vitals/Pain Pain Assessment: 0-10 Pain Score: 4  Pain Location: Rt hip Pain Descriptors / Indicators: Aching;Discomfort Pain Intervention(s): Limited activity within patient's tolerance;Monitored during session;Premedicated before session;Ice applied    Home Living                      Prior Function            PT Goals (current goals can now be found in the care plan section) Acute Rehab PT Goals Patient Stated Goal: recover and get back to independence PT Goal Formulation: With patient Time For Goal Achievement: 08/14/19 Potential to Achieve Goals: Good Progress towards PT goals:  Progressing toward goals    Frequency    7X/week      PT Plan Current plan remains appropriate    Co-evaluation              AM-PAC PT "6 Clicks" Mobility   Outcome Measure  Help needed turning from your back to your side while in a flat bed without using bedrails?: A Little Help needed moving from lying on your back to sitting on the side of a flat bed without using bedrails?: A Little Help needed moving to and from a bed to a chair (including a  wheelchair)?: A Little Help needed standing up from a chair using your arms (e.g., wheelchair or bedside chair)?: A Little Help needed to walk in hospital room?: A Little Help needed climbing 3-5 steps with a railing? : A Little 6 Click Score: 18    End of Session Equipment Utilized During Treatment: Gait belt;Right knee immobilizer Activity Tolerance: Patient tolerated treatment well Patient left: in chair;with call bell/phone within reach;with chair alarm set;with family/visitor present Nurse Communication: Mobility status PT Visit Diagnosis: Muscle weakness (generalized) (M62.81);Difficulty in walking, not elsewhere classified (R26.2)     Time: [id number] PT Time Calculation (min) (ACUTE ONLY): 33 min  Charges:  $Gait Training: 8-22 mins $Therapeutic Exercise: 8-22 mins                     [place] Pager [phone] Office [phone]    Pier Laux 08/08/2019, 12:30 PM

## 2019-08-08 NOTE — Progress Notes (Signed)
Physical Therapy Treatment Patient Details Name: Kristin Kramer MRN: [id number] DOB: 08/14/1940 Today's Date: 08/08/2019    History of Present Illness Patient is 79 y.o. female s/p Rt THR (acetabular revision) on 08/07/19 with PMH significant for osteopenia, migraines, OA, bil TKA, HLD, hypothtyroidism, Rt hip fracture in 2019 with with IM nail on 07/08/17 and later conversion to THA on 12/07/17.     PT Comments    Pt continues to progress well and eager for dc home.  Pt reviewed car transfers, stairs and HEP with written instruction provided.   Follow Up Recommendations  Follow surgeon's recommendation for DC plan and follow-up therapies     Equipment Recommendations  None recommended by PT    Recommendations for Other Services       Precautions / Restrictions Precautions Precautions: Fall;Posterior Hip Precaution Booklet Issued: Yes (comment) Precaution Comments: Pt recalls all THP without cues Required Braces or Orthoses: Knee Immobilizer - Right Restrictions Weight Bearing Restrictions: No Other Position/Activity Restrictions: WBAT    Mobility  Bed Mobility Overal bed mobility: Needs Assistance Bed Mobility: Supine to Sit     Supine to sit: Min guard     General bed mobility comments: Pt up in chair and requests back to same  Transfers Overall transfer level: Needs assistance Equipment used: Rolling walker (2 wheeled) Transfers: Sit to/from Stand Sit to Stand: Supervision         General transfer comment: cues for LE management and use of UEs to self assist  Ambulation/Gait Ambulation/Gait assistance: Min guard;Supervision Gait Distance (Feet): 100 Feet Assistive device: Rolling walker (2 wheeled) Gait Pattern/deviations: Step-to pattern;Decreased stride length;Decreased stance time - right;Decreased step length - left;Decreased weight shift to right;Antalgic Gait velocity: decreased   General Gait Details: min cues for posture, position from RW and  initial sequence   Stairs Stairs: Yes Stairs assistance: Min assist Stair Management: No rails;Step to pattern;Forwards;With walker Number of Stairs: 3 General stair comments: single step x 3 with RW and cues for sequence and foot/RW placement   Wheelchair Mobility    Modified Rankin (Stroke Patients Only)       Balance Overall balance assessment: Needs assistance Sitting-balance support: Feet supported Sitting balance-Leahy Scale: Good     Standing balance support: During functional activity;Bilateral upper extremity supported Standing balance-Leahy Scale: Fair                              Cognition Arousal/Alertness: Awake/alert Behavior During Therapy: WFL for tasks assessed/performed Overall Cognitive Status: Within Functional Limits for tasks assessed                                        Exercises Total Joint Exercises Ankle Circles/Pumps: AROM;Both;20 reps;Supine Quad Sets: AROM;Both;10 reps;Supine Heel Slides: AAROM;Right;20 reps;Supine Hip ABduction/ADduction: AAROM;Right;15 reps;Supine    General Comments        Pertinent Vitals/Pain Pain Assessment: 0-10 Pain Score: 6  Pain Location: Rt hip Pain Descriptors / Indicators: Aching;Discomfort Pain Intervention(s): Limited activity within patient's tolerance;Monitored during session;Premedicated before session;Patient requesting pain meds-RN notified;Ice applied    Home Living                      Prior Function            PT Goals (current goals can now be found in the care plan  section) Acute Rehab PT Goals Patient Stated Goal: recover and get back to independence PT Goal Formulation: With patient Time For Goal Achievement: 08/14/19 Potential to Achieve Goals: Good Progress towards PT goals: Progressing toward goals    Frequency    7X/week      PT Plan Current plan remains appropriate    Co-evaluation              AM-PAC PT "6 Clicks"  Mobility   Outcome Measure  Help needed turning from your back to your side while in a flat bed without using bedrails?: A Little Help needed moving from lying on your back to sitting on the side of a flat bed without using bedrails?: A Little Help needed moving to and from a bed to a chair (including a wheelchair)?: A Little Help needed standing up from a chair using your arms (e.g., wheelchair or bedside chair)?: A Little Help needed to walk in hospital room?: A Little Help needed climbing 3-5 steps with a railing? : A Little 6 Click Score: 18    End of Session Equipment Utilized During Treatment: Gait belt Activity Tolerance: Patient tolerated treatment well Patient left: in chair;with call bell/phone within reach;with chair alarm set;with family/visitor present Nurse Communication: Mobility status PT Visit Diagnosis: Muscle weakness (generalized) (M62.81);Difficulty in walking, not elsewhere classified (R26.2)     Time: 1004-1020 PT Time Calculation (min) (ACUTE ONLY): 16 min  Charges:  $Gait Training: 8-22 mins $Therapeutic Exercise: 8-22 mins                     Debe Coder PT Acute Rehabilitation Services Pager [phone] Office [phone]    Bethannie Iglehart 08/08/2019, 12:34 PM

## 2019-08-10 DIAGNOSIS — J302 Other seasonal allergic rhinitis: Secondary | ICD-10-CM | POA: Diagnosis not present

## 2019-08-10 DIAGNOSIS — Z85828 Personal history of other malignant neoplasm of skin: Secondary | ICD-10-CM | POA: Diagnosis not present

## 2019-08-10 DIAGNOSIS — R7303 Prediabetes: Secondary | ICD-10-CM | POA: Diagnosis not present

## 2019-08-10 DIAGNOSIS — E785 Hyperlipidemia, unspecified: Secondary | ICD-10-CM | POA: Diagnosis not present

## 2019-08-10 DIAGNOSIS — Z87440 Personal history of urinary (tract) infections: Secondary | ICD-10-CM | POA: Diagnosis not present

## 2019-08-10 DIAGNOSIS — T84090D Other mechanical complication of internal right hip prosthesis, subsequent encounter: Secondary | ICD-10-CM | POA: Diagnosis not present

## 2019-08-10 DIAGNOSIS — K219 Gastro-esophageal reflux disease without esophagitis: Secondary | ICD-10-CM | POA: Diagnosis not present

## 2019-08-10 DIAGNOSIS — G43909 Migraine, unspecified, not intractable, without status migrainosus: Secondary | ICD-10-CM | POA: Diagnosis not present

## 2019-08-10 DIAGNOSIS — Z96653 Presence of artificial knee joint, bilateral: Secondary | ICD-10-CM | POA: Diagnosis not present

## 2019-08-10 DIAGNOSIS — E039 Hypothyroidism, unspecified: Secondary | ICD-10-CM | POA: Diagnosis not present

## 2019-08-10 DIAGNOSIS — Z7982 Long term (current) use of aspirin: Secondary | ICD-10-CM | POA: Diagnosis not present

## 2019-08-10 DIAGNOSIS — E559 Vitamin D deficiency, unspecified: Secondary | ICD-10-CM | POA: Diagnosis not present

## 2019-08-12 NOTE — Discharge Summary (Signed)
Physician Discharge Summary   Patient ID: Kristin Kramer MRN: [id number] DOB/AGE: 07/18/1940 79 y.o.  Admit date: 08/07/2019 Discharge date: 08/08/2019  Primary Diagnosis: Failed right total hip arthroplasty.  Admission Diagnoses:  Past Medical History:  Diagnosis Date  . 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 Abnormal ECG    a. left axis deviation;  b. 12/2011 Echo: EF 55-60%, no rwma, pasp 70mmHg.  . Arthritis   . Closed right hip fracture ([place]) 07/07/2017  . Depression    prior on lexapro, celexa, wellbutrin.  high cymbalta doses cause tremors  . GERD (gastroesophageal reflux disease)   . H/O seasonal allergies   . Herpes zoster 02/19/2014  . History of pneumonia   . History of UTI    12/2011 - Treatment began preoperatively with CIPRO 500mg  BID  . HLD (hyperlipidemia)    statin intolerance  . Hypothyroidism   . Knee osteoarthritis 2013, 2014   bilateral s/p B TKR [facility])  . Knee osteoarthritis   . 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 Migraines   . Osteopenia 08/2016   hip -2.4, spine -1.4  . Post-operative nausea and vomiting   . Pre-diabetes   . Refusal of blood transfusions as patient is Jehovah's Witness   . Seasonal allergies    cats, dust, mold, roaches  . 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 Isac Sarna), R tibia - then recurrent x2, L tibia, R tibia x3  . Tubular adenoma of colon 08/2014  . Vitamin D deficiency    Discharge Diagnoses:   Principal Problem:   Failed total hip arthroplasty ([place]) Active Problems:   Failure of right total hip arthroplasty ([place])  Estimated body mass index is 24.96 kg/m as calculated from the following:   Height as of this encounter: 5\' 5"  (1.651 m).   Weight as of this encounter: 68 kg.  Procedure:  Procedure(s)  (LRB): Right hip acetabular versus total hip arthroplasty revision-posterior (Right)   Consults: None  HPI: The patient is a 79 year old female who had a right total hip arthroplasty done in [place] approximately a year and a half ago and has had pain in the hip since.  She also has a popping sensation when she ambulates.  Radiographs showed that she has a dual mobility acetabular component, but that the component is adducted and retroverted.  She has not had any dislocation episodes.  She presents now for acetabular versus total hip revision.  Laboratory Data: Admission on 08/07/2019, Discharged on 08/08/2019  Component Date Value Ref Range Status  . Transfuse no blood products 08/07/2019    Final                   Value:TRANSFUSE NO BLOOD PRODUCTS, VERIFIED BY Liborio Nixon MINOR, RN Performed at [facility], [place] [address], [place], [place] [address]   . WBC 08/08/2019 8.3  4.0 - 10.5 K/uL Final  . RBC 08/08/2019 3.57* 3.87 - 5.11 MIL/uL Final  . Hemoglobin 08/08/2019 10.4* 12.0 - 15.0 g/dL Final  . HCT 08/08/2019 32.7* 36.0 - 46.0 % Final  . MCV 08/08/2019 91.6  80.0 - 100.0 fL Final  . MCH 08/08/2019 29.1  26.0 - 34.0 pg Final  . MCHC 08/08/2019 31.8  30.0 - 36.0 g/dL Final  . RDW 08/08/2019 12.7  11.5 - 15.5 % Final  .  Platelets 08/08/2019 185  150 - 400 K/uL Final  . nRBC 08/08/2019 0.0  0.0 - 0.2 % Final   Performed at [facility], [place] [address], [place], [place] [address]  . Sodium 08/08/2019 140  135 - 145 mmol/L Final  . Potassium 08/08/2019 3.8  3.5 - 5.1 mmol/L Final  . Chloride 08/08/2019 105  98 - 111 mmol/L Final  . CO2 08/08/2019 27  22 - 32 mmol/L Final  . Glucose, Bld 08/08/2019 158* 70 - 99 mg/dL Final   Glucose reference range applies only to samples taken after fasting for at least 8 hours.  . BUN 08/08/2019 13  8 - 23 mg/dL Final  . Creatinine, Ser 08/08/2019 0.44  0.44 - 1.00 mg/dL Final  . Calcium 08/08/2019 8.3* 8.9 -  10.3 mg/dL Final  . GFR calc non Af Amer 08/08/2019 >60  >60 mL/min Final  . GFR calc Af Amer 08/08/2019 >60  >60 mL/min Final  . Anion gap 08/08/2019 8  5 - 15 Final   Performed at [facility], [place] [address], [place], [place] [address]  Hospital Outpatient Visit on 08/03/2019  Component Date Value Ref Range Status  . SARS Coronavirus 2 08/03/2019 NEGATIVE  NEGATIVE Final   Comment: (NOTE) SARS-CoV-2 target nucleic acids are NOT DETECTED. The SARS-CoV-2 RNA is generally detectable in upper and lower respiratory specimens during the acute phase of infection. Negative results do not preclude SARS-CoV-2 infection, do not rule out co-infections with other pathogens, and should not be used as the sole basis for treatment or other patient management decisions. Negative results must be combined with clinical observations, patient history, and epidemiological information. The expected result is Negative. Fact Sheet for Patients: SugarRoll.be Fact Sheet for Healthcare Providers: https://www.woods-mathews.com/ This test is not yet approved or cleared by the [place] FDA and  has been authorized for detection and/or diagnosis of SARS-CoV-2 by FDA under an Emergency Use Authorization (EUA). This EUA will remain  in effect (meaning this test can be used) for the duration of the COVID-19 declaration under Section 56                          4(b)(1) of the Act, 21 U.S.C. section 360bbb-3(b)(1), unless the authorization is terminated or revoked sooner. Performed at [facility], [place] [address], [place], [place] [address]   Hospital Outpatient Visit on 07/29/2019  Component Date Value Ref Range Status  . MRSA, PCR 07/29/2019 NEGATIVE  NEGATIVE Final  . Staphylococcus aureus 07/29/2019 NEGATIVE  NEGATIVE Final   Comment: (NOTE) The Xpert SA Assay (FDA approved for NASAL specimens in patients 76 years of age and older), is  one component of a comprehensive surveillance program. It is not intended to diagnose infection nor to guide or monitor treatment. Performed at [facility], [place] [address], [place], [place] [address]   . aPTT 07/29/2019 27  24 - 36 seconds Final   Performed at [facility], [place] [address], [place],  [address]  . WBC 07/29/2019 5.2  4.0 - 10.5 K/uL Final  . RBC 07/29/2019 4.83  3.87 - 5.11 MIL/uL Final  . Hemoglobin 07/29/2019 14.1  12.0 - 15.0 g/dL Final  . HCT 07/29/2019 44.5  36.0 - 46.0 % Final  . MCV 07/29/2019 92.1  80.0 - 100.0 fL Final  . MCH 07/29/2019 29.2  26.0 - 34.0 pg Final  . MCHC 07/29/2019 31.7  30.0 - 36.0 g/dL Final  . RDW 07/29/2019 12.8  11.5 - 15.5 % Final  . Platelets 07/29/2019 218  150 - 400 K/uL Final  . nRBC 07/29/2019 0.0  0.0 - 0.2 % Final   Performed at [facility], [place] [address] [place], [place] [address]  . Sodium 07/29/2019 146* 135 - 145 mmol/L Final  . Potassium 07/29/2019 4.5  3.5 - 5.1 mmol/L Final  . Chloride 07/29/2019 106  98 - 111 mmol/L Final  . CO2 07/29/2019 29  22 - 32 mmol/L Final  . Glucose, Bld 07/29/2019 111* 70 - 99 mg/dL Final   Glucose reference range applies only to samples taken after fasting for at least 8 hours.  . BUN 07/29/2019 15  8 - 23 mg/dL Final  . Creatinine, Ser 07/29/2019 0.61  0.44 - 1.00 mg/dL Final  . Calcium 07/29/2019 9.7  8.9 - 10.3 mg/dL Final  . Total Protein 07/29/2019 7.7  6.5 - 8.1 g/dL Final  . Albumin 07/29/2019 4.4  3.5 - 5.0 g/dL Final  . AST 07/29/2019 23  15 - 41 U/L Final  . ALT 07/29/2019 24  0 - 44 U/L Final  . Alkaline Phosphatase 07/29/2019 110  38 - 126 U/L Final  . Total Bilirubin 07/29/2019 0.4  0.3 - 1.2 mg/dL Final  . GFR calc non Af Amer 07/29/2019 >60  >60 mL/min Final  . GFR calc Af Amer 07/29/2019 >60  >60 mL/min Final  . Anion gap 07/29/2019 11  5 - 15 Final   Performed at [facility], [place]  [address], [place], [place] [address]  . Prothrombin Time 07/29/2019 12.1  11.4 - 15.2 seconds Final  . INR 07/29/2019 0.9  0.8 - 1.2 Final   Comment: (NOTE) INR goal varies based on device and disease states. Performed at [facility], [place] [address], [place], [place] [address]   Office Visit on 07/10/2019  Component Date Value Ref Range Status  . INR 07/10/2019 1.0  0.8 - 1.0 ratio Final  . Prothrombin Time 07/10/2019 10.9  9.6 - 13.1 sec Final  . Sodium 07/10/2019 140  135 - 145 mEq/L Final  . Potassium 07/10/2019 3.8  3.5 - 5.1 mEq/L Final  . Chloride 07/10/2019 101  96 - 112 mEq/L Final  . CO2 07/10/2019 33* 19 - 32 mEq/L Final  . Glucose, Bld 07/10/2019 101* 70 - 99 mg/dL Final  . BUN 07/10/2019 15  6 - 23 mg/dL Final  . Creatinine, Ser 07/10/2019 0.62  0.40 - 1.20 mg/dL Final  . Total Bilirubin 07/10/2019 0.7  0.2 - 1.2 mg/dL Final  . Alkaline Phosphatase 07/10/2019 118* 39 - 117 U/L Final  . AST 07/10/2019 25  0 - 37 U/L Final  . ALT 07/10/2019 21  0 - 35 U/L Final  . Total Protein 07/10/2019 7.7  6.0 - 8.3 g/dL Final  . Albumin 07/10/2019 4.6  3.5 - 5.2 g/dL Final  . GFR 07/10/2019 93.00  >60.00 mL/min Final  . Calcium 07/10/2019 9.9  8.4 - 10.5 mg/dL Final  . WBC 07/10/2019 4.8  4.0 - 10.5 K/uL Final  . RBC 07/10/2019 4.68  3.87 - 5.11 Mil/uL Final  . Hemoglobin 07/10/2019 13.6  12.0 - 15.0 g/dL Final  . HCT 07/10/2019 41.3  36.0 - 46.0 % Final  . MCV 07/10/2019 88.2  78.0 - 100.0 fl Final  . MCHC 07/10/2019 33.0  30.0 - 36.0 g/dL Final  . RDW 07/10/2019 13.4  11.5 - 15.5 % Final  . Platelets 07/10/2019 254.0  150.0 - 400.0 K/uL Final  . Neutrophils Relative % 07/10/2019 55.8  43.0 - 77.0 % Final  . Lymphocytes Relative 07/10/2019 30.6  12.0 - 46.0 % Final  . Monocytes Relative 07/10/2019 9.4  3.0 - 12.0 % Final  . Eosinophils Relative 07/10/2019 3.3  0.0 - 5.0 % Final  . Basophils Relative 07/10/2019 0.9  0.0 - 3.0 % Final  . Neutro Abs 07/10/2019  2.7  1.4 - 7.7 K/uL Final  . Lymphs Abs 07/10/2019 1.5  0.7 - 4.0 K/uL Final  . Monocytes Absolute 07/10/2019 0.5  0.1 - 1.0 K/uL Final  . Eosinophils Absolute 07/10/2019 0.2  0.0 - 0.7 K/uL Final  . Basophils Absolute 07/10/2019 0.0  0.0 - 0.1 K/uL Final  . Pro B Natriuretic peptide (BNP) 07/10/2019 176.0* 0.0 - 100.0 pg/mL Final  . Hgb A1c MFr Bld 07/10/2019 5.7  4.6 - 6.5 % Final   Glycemic Control Guidelines for People with Diabetes:Non Diabetic:  <6%Goal of Therapy: <7%Additional Action Suggested:  >8%   . Color, UA 07/10/2019 light yellow   Final  . Clarity, UA 07/10/2019 clear   Final  . Glucose, UA 07/10/2019 Negative  Negative Final  . Bilirubin, UA 07/10/2019 negative   Final  . Ketones, UA 07/10/2019 negative   Final  . Spec Grav, UA 07/10/2019 1.010  1.010 - 1.025 Final  . Blood, UA 07/10/2019 negaive   Final  . pH, UA 07/10/2019 6.0  5.0 - 8.0 Final  . Protein, UA 07/10/2019 Negative  Negative Final  . Urobilinogen, UA 07/10/2019 0.2  0.2 or 1.0 E.U./dL Final  . Nitrite, UA 07/10/2019 negative   Final  . Leukocytes, UA 07/10/2019 Negative  Negative Final     X-Rays:DG Pelvis Portable  Result Date: 08/07/2019 CLINICAL DATA:  79 year old female status post right hip arthroplasty. EXAM: PORTABLE PELVIS 1-2 VIEWS COMPARISON:  Right hip radiograph dated 12/07/2017. FINDINGS: There is a total right hip arthroplasty. The arthroplasty components appear intact and in anatomic alignment. No acute fracture or dislocation. Old fracture of the lesser trochanter of the right femur. Postsurgical changes with soft tissue air over the right pelvis. IMPRESSION: Status post total right hip arthroplasty. No acute fracture or dislocation. Electronically Signed   By: Anner Crete M.D.   On: 08/07/2019 16:23    EKG: Orders placed or performed in visit on 07/10/19  . EKG 12-Lead     Hospital Course: Kristin Kramer is a 79 y.o. who was admitted to [facility]. They were brought  to the operating room on 08/07/2019 and underwent Procedure(s): Right hip acetabular versus total hip arthroplasty revision-posterior.  Patient tolerated the procedure well and was later transferred to the recovery room and then to the orthopaedic floor for postoperative care. They were given PO and IV analgesics for pain control following their surgery. They were given 24 hours of postoperative antibiotics of  Anti-infectives (From admission, onward)   Start     Dose/Rate Route Frequency Ordered Stop   08/07/19 1730  ceFAZolin (ANCEF) IVPB 2g/100 mL premix     2 g 200 mL/hr over 30 Minutes Intravenous Every 6 hours 08/07/19 1534 08/07/19 2303   08/07/19 0945  ceFAZolin (ANCEF) IVPB 2g/100 mL premix     2 g 200 mL/hr over 30 Minutes Intravenous On call to O.R. 08/07/19 [id number] 08/07/19 1140     and started on DVT prophylaxis in the form of Aspirin.  PT and OT were ordered for total joint protocol. Discharge planning consulted to help with postop disposition and equipment needs.  Patient had a good night on the evening of surgery. They started to get up OOB with therapy on POD #0. Pt was seen during rounds and was ready to go home pending progress with therapy. She worked with therapy on POD #1 and was meeting her goals. Pt was discharged to home later that day in stable condition.  Diet: Regular diet Activity: WBAT Follow-up: in 2 weeks Disposition: Home Discharged Condition: good   Discharge Instructions    Call MD / Call 911   Complete by: As directed    If you experience chest pain or shortness of breath, CALL 911 and be transported to the hospital emergency room.  If you develope a fever above 101 F, pus (white drainage) or increased drainage or redness at the wound, or calf pain, call your surgeon's office.   Change dressing   Complete by: As directed    You have an adhesive waterproof bandage over the incision. Leave this in place until your first follow-up appointment. Once you remove  this you will not need to place another bandage.   Constipation Prevention   Complete by: As directed    Drink plenty of fluids.  Prune juice may be helpful.  You may use a stool softener, such as Colace (over the counter) 100 mg twice a day.  Use MiraLax (over the counter) for constipation as needed.   Diet - low sodium heart healthy   Complete by: As directed    Do not sit on low chairs, stoools or toilet seats, as it may be difficult to get up from low surfaces   Complete by: As directed    Driving restrictions   Complete by: As directed    No driving for two weeks   Follow the hip precautions as taught in Physical Therapy   Complete by: As directed    TED hose   Complete by: As directed    Use stockings (TED hose) for three weeks on both leg(s).  You may remove them at night for sleeping.   Weight bearing as tolerated   Complete by: As directed      Allergies as of 08/08/2019      Reactions   Blood-group Specific Substance    Lipitor [atorvastatin] Other (See Comments)   Cramps   Pravastatin Other (See Comments)   Cramps       Medication List    STOP taking these medications   acetaminophen 500 MG tablet Commonly known as: TYLENOL   Fish Oil 1200 MG Caps     TAKE these medications   ALPRAZolam 0.25 MG tablet Commonly known as: XANAX Take 1 tablet (0.25 mg total) by mouth daily as needed for anxiety or sleep.   aspirin 325 MG EC tablet Take 1 tablet (325 mg total) by mouth 2 (two) times daily for 21 days. Then take aspirin 81 mg daily for another 3 weeks.   calcium carbonate 1500 (600 Ca) MG Tabs tablet Commonly known as: OSCAL Take 600 mg of elemental calcium by mouth daily with breakfast.   COQ10 PO Take 1 capsule by mouth daily.   donepezil 5 MG tablet Commonly known as: ARICEPT Take 1 tablet (5 mg total) by mouth daily.   ezetimibe 10 MG tablet Commonly known as: ZETIA TAKE 1 TABLET BY MOUTH EVERY DAY   famotidine 20 MG tablet Commonly known as:  Pepcid  Take 1 tablet (20 mg total) by mouth at bedtime.   HYDROcodone-acetaminophen 5-325 MG tablet Commonly known as: NORCO/VICODIN Take 1-2 tablets by mouth every 6 (six) hours as needed for moderate pain (pain score 4-6).   levothyroxine 25 MCG tablet Commonly known as: SYNTHROID Take 1 tablet (25 mcg total) by mouth daily before breakfast.   loratadine 10 MG tablet Commonly known as: CLARITIN Take 1 tablet (10 mg total) by mouth daily.   methocarbamol 500 MG tablet Commonly known as: Robaxin Take 1 tablet (500 mg total) by mouth every 6 (six) hours as needed for muscle spasms.   montelukast 10 MG tablet Commonly known as: SINGULAIR Take 1 tablet (10 mg total) by mouth at bedtime.   niacinamide 500 MG tablet Take 1 tablet (500 mg total) by mouth daily.   ondansetron 8 MG disintegrating tablet Commonly known as: Zofran ODT Take 1 tablet (8 mg total) by mouth every 8 (eight) hours as needed for nausea or vomiting.   PROBIOTIC PO Take 1 capsule by mouth daily.   sertraline 25 MG tablet Commonly known as: ZOLOFT ALTERNATE 1 TABLET AND 1.5 TABLETS EVERY OTHER DAY What changed:   how much to take  how to take this  when to take this  additional instructions   TURMERIC PO Take 900 mg by mouth daily.   Vitamin D-3 125 MCG (5000 UT) Tabs Take 5,000 Units by mouth daily.            Discharge Care Instructions  (From admission, onward)         Start     Ordered   08/08/19 0000  Weight bearing as tolerated     08/08/19 0955   08/08/19 0000  Change dressing    Comments: You have an adhesive waterproof bandage over the incision. Leave this in place until your first follow-up appointment. Once you remove this you will not need to place another bandage.   08/08/19 0955         Follow-up Information    Gaynelle Arabian, MD. Schedule an appointment as soon as possible for a visit on 08/20/2019.   Specialty: Orthopedic Surgery Contact information: [address] [place] [address] [phone]        [facility] Follow up.   Specialty: Home Health Services Why: Home Physical Therapy. Contact information: [address] [place] [address] [phone]           Signed: Griffith Citron, PA-C Orthopedic Surgery 08/12/2019, 6:07 PM

## 2019-08-13 ENCOUNTER — Other Ambulatory Visit: Payer: Medicare Other

## 2019-08-13 ENCOUNTER — Encounter: Payer: Self-pay | Admitting: *Deleted

## 2019-08-13 ENCOUNTER — Other Ambulatory Visit: Payer: Self-pay | Admitting: *Deleted

## 2019-08-13 ENCOUNTER — Other Ambulatory Visit: Payer: Self-pay | Admitting: Family Medicine

## 2019-08-13 DIAGNOSIS — T84090D Other mechanical complication of internal right hip prosthesis, subsequent encounter: Secondary | ICD-10-CM | POA: Diagnosis not present

## 2019-08-13 DIAGNOSIS — G43909 Migraine, unspecified, not intractable, without status migrainosus: Secondary | ICD-10-CM | POA: Diagnosis not present

## 2019-08-13 DIAGNOSIS — Z96653 Presence of artificial knee joint, bilateral: Secondary | ICD-10-CM | POA: Diagnosis not present

## 2019-08-13 DIAGNOSIS — Z85828 Personal history of other malignant neoplasm of skin: Secondary | ICD-10-CM | POA: Diagnosis not present

## 2019-08-13 DIAGNOSIS — R7303 Prediabetes: Secondary | ICD-10-CM | POA: Diagnosis not present

## 2019-08-13 DIAGNOSIS — E039 Hypothyroidism, unspecified: Secondary | ICD-10-CM | POA: Diagnosis not present

## 2019-08-13 DIAGNOSIS — J302 Other seasonal allergic rhinitis: Secondary | ICD-10-CM | POA: Diagnosis not present

## 2019-08-13 DIAGNOSIS — E785 Hyperlipidemia, unspecified: Secondary | ICD-10-CM | POA: Diagnosis not present

## 2019-08-13 DIAGNOSIS — Z7982 Long term (current) use of aspirin: Secondary | ICD-10-CM | POA: Diagnosis not present

## 2019-08-13 DIAGNOSIS — E559 Vitamin D deficiency, unspecified: Secondary | ICD-10-CM | POA: Diagnosis not present

## 2019-08-13 DIAGNOSIS — K219 Gastro-esophageal reflux disease without esophagitis: Secondary | ICD-10-CM | POA: Diagnosis not present

## 2019-08-13 DIAGNOSIS — Z87440 Personal history of urinary (tract) infections: Secondary | ICD-10-CM | POA: Diagnosis not present

## 2019-08-13 NOTE — Telephone Encounter (Signed)
Aricept Last filled:  05/17/19, #90 Last OV:  07/10/19, pre op eval Next OV:  09/05/19, AWV prt 2

## 2019-08-13 NOTE — Patient Outreach (Addendum)
[facility]) Care Management  08/13/2019  Kristin Kramer July 03, 1940 [id number]   Subjective: Telephone call to patient's home / mobile number, spoke with patient, and HIPAA verified.  Discussed [employer] EMMI General Discharge follow up, patient voiced understanding, and is in agreement to follow up.  States she remembers receiving EMMI automated call, was unable to pick up Robaxin because the provider had not sent prescription to pharmacy, and medication was listed on discharge instructions.  States she did call surgeon's after hours number on 08/10/2019, phone rang several times, no one answered, has not attempted to  followed up with surgeon's office again, since current medications are managing her pain, and has had no spasms.   RNCM encouraged patient to follow up with surgeons office today to notify of issue, in case medication need in the future, patient voiced understanding, and states she will follow up.   Patient states she will also notify this RNCM is assistance needed to follow up on this issue.   Patient states she is doing good, has a follow up appointment with surgeon on 08/20/2019 and with primary MD on 09/05/2019.  States primary MD is aware of hospitalization, had preoperative clearance visit prior to hospitalization, and no sooner follow up appointment needed. Patient states she is able to manage self care and has assistance as needed.  Patient voices understanding of medical diagnosis and treatment plan. States she is accessing her Medicare benefits as needed via member services number on back of card. Discussed Advanced Directives, patient has documents completed and are available in the [place].   Patient states she does not have any education material, EMMI follow up, care coordination, care management, disease monitoring, transportation, community resource, or pharmacy needs at this time.  States ahe is very appreciative of the follow  up, is in agreement  to receive 1 additional follow up call to assess for further CM needs, and is in agreement to receive [place] [facility] EMMI follow up calls as needed.  States she is very appreciative of the follow up, is in agreement  to receive 1 additional follow up call to assess for further CM needs, and is in agreement to receive [place] [facility] EMMI follow up calls as needed.   Patient requested outreach after patient's follow up appointments with primary MD and surgeon, will call this RNCM if needed prior to next outreach.      Objective: Per KPN (Knowledge Performance Now, point of care tool) and chart review, patient hospitalization 08/07/2019 - 08/08/2019 for Failed right total hip arthroplasty, status post Right hip acetabular revision on 08/07/2019.     Patient also has a history of Closed right hip fracture , Depression, Herpes zoster, pneumonia, UTI, HLD (hyperlipidemia), Hypothyroidism, Knee osteoarthritis, Mesenteric lymphadenitis, Migraines,  Pre-diabetes, Refusal of blood transfusions as patient is Jehovah's Witness, Seasonal allergies, Tubular adenoma of colon, and Squamous cell skin cancer.        Assessment: Received [employer] EMMI General Discharge Red Flag Alert follow up referral on 08/12/2019.   Red Flag Alert Trigger, Day #1, patient answered yes to the following question: Unfilled prescriptions?   EMMI follow up completed and will follow up to assess further care management needs.      Plan: RNCM will call patient for telephone outreach attempt, within 21 business days, EMMI follow up, to assess for further CM needs, and proceed with case closure, within 10 business days if no return call, after 4th unsuccessful  outreach call.      Peregrine Nolt H. Annia Friendly, BSN, [place] [facility] Telephonic CM Phone: [phone] Fax: [phone]

## 2019-08-14 DIAGNOSIS — E559 Vitamin D deficiency, unspecified: Secondary | ICD-10-CM | POA: Diagnosis not present

## 2019-08-14 DIAGNOSIS — G43909 Migraine, unspecified, not intractable, without status migrainosus: Secondary | ICD-10-CM | POA: Diagnosis not present

## 2019-08-14 DIAGNOSIS — Z7982 Long term (current) use of aspirin: Secondary | ICD-10-CM | POA: Diagnosis not present

## 2019-08-14 DIAGNOSIS — Z96653 Presence of artificial knee joint, bilateral: Secondary | ICD-10-CM | POA: Diagnosis not present

## 2019-08-14 DIAGNOSIS — E785 Hyperlipidemia, unspecified: Secondary | ICD-10-CM | POA: Diagnosis not present

## 2019-08-14 DIAGNOSIS — E039 Hypothyroidism, unspecified: Secondary | ICD-10-CM | POA: Diagnosis not present

## 2019-08-14 DIAGNOSIS — J302 Other seasonal allergic rhinitis: Secondary | ICD-10-CM | POA: Diagnosis not present

## 2019-08-14 DIAGNOSIS — Z87440 Personal history of urinary (tract) infections: Secondary | ICD-10-CM | POA: Diagnosis not present

## 2019-08-14 DIAGNOSIS — Z85828 Personal history of other malignant neoplasm of skin: Secondary | ICD-10-CM | POA: Diagnosis not present

## 2019-08-14 DIAGNOSIS — T84090D Other mechanical complication of internal right hip prosthesis, subsequent encounter: Secondary | ICD-10-CM | POA: Diagnosis not present

## 2019-08-14 DIAGNOSIS — K219 Gastro-esophageal reflux disease without esophagitis: Secondary | ICD-10-CM | POA: Diagnosis not present

## 2019-08-14 DIAGNOSIS — R7303 Prediabetes: Secondary | ICD-10-CM | POA: Diagnosis not present

## 2019-08-15 DIAGNOSIS — E785 Hyperlipidemia, unspecified: Secondary | ICD-10-CM | POA: Diagnosis not present

## 2019-08-15 DIAGNOSIS — Z96653 Presence of artificial knee joint, bilateral: Secondary | ICD-10-CM | POA: Diagnosis not present

## 2019-08-15 DIAGNOSIS — G43909 Migraine, unspecified, not intractable, without status migrainosus: Secondary | ICD-10-CM | POA: Diagnosis not present

## 2019-08-15 DIAGNOSIS — J302 Other seasonal allergic rhinitis: Secondary | ICD-10-CM | POA: Diagnosis not present

## 2019-08-15 DIAGNOSIS — E559 Vitamin D deficiency, unspecified: Secondary | ICD-10-CM | POA: Diagnosis not present

## 2019-08-15 DIAGNOSIS — Z7982 Long term (current) use of aspirin: Secondary | ICD-10-CM | POA: Diagnosis not present

## 2019-08-15 DIAGNOSIS — Z87440 Personal history of urinary (tract) infections: Secondary | ICD-10-CM | POA: Diagnosis not present

## 2019-08-15 DIAGNOSIS — E039 Hypothyroidism, unspecified: Secondary | ICD-10-CM | POA: Diagnosis not present

## 2019-08-15 DIAGNOSIS — T84090D Other mechanical complication of internal right hip prosthesis, subsequent encounter: Secondary | ICD-10-CM | POA: Diagnosis not present

## 2019-08-15 DIAGNOSIS — R7303 Prediabetes: Secondary | ICD-10-CM | POA: Diagnosis not present

## 2019-08-15 DIAGNOSIS — K219 Gastro-esophageal reflux disease without esophagitis: Secondary | ICD-10-CM | POA: Diagnosis not present

## 2019-08-15 DIAGNOSIS — Z85828 Personal history of other malignant neoplasm of skin: Secondary | ICD-10-CM | POA: Diagnosis not present

## 2019-08-16 ENCOUNTER — Ambulatory Visit: Payer: Medicare Other

## 2019-08-18 ENCOUNTER — Other Ambulatory Visit: Payer: Self-pay | Admitting: Family Medicine

## 2019-08-19 DIAGNOSIS — K219 Gastro-esophageal reflux disease without esophagitis: Secondary | ICD-10-CM | POA: Diagnosis not present

## 2019-08-19 DIAGNOSIS — E785 Hyperlipidemia, unspecified: Secondary | ICD-10-CM | POA: Diagnosis not present

## 2019-08-19 DIAGNOSIS — R7303 Prediabetes: Secondary | ICD-10-CM | POA: Diagnosis not present

## 2019-08-19 DIAGNOSIS — E559 Vitamin D deficiency, unspecified: Secondary | ICD-10-CM | POA: Diagnosis not present

## 2019-08-19 DIAGNOSIS — Z85828 Personal history of other malignant neoplasm of skin: Secondary | ICD-10-CM | POA: Diagnosis not present

## 2019-08-19 DIAGNOSIS — T84090D Other mechanical complication of internal right hip prosthesis, subsequent encounter: Secondary | ICD-10-CM | POA: Diagnosis not present

## 2019-08-19 DIAGNOSIS — G43909 Migraine, unspecified, not intractable, without status migrainosus: Secondary | ICD-10-CM | POA: Diagnosis not present

## 2019-08-19 DIAGNOSIS — Z96653 Presence of artificial knee joint, bilateral: Secondary | ICD-10-CM | POA: Diagnosis not present

## 2019-08-19 DIAGNOSIS — E039 Hypothyroidism, unspecified: Secondary | ICD-10-CM | POA: Diagnosis not present

## 2019-08-19 DIAGNOSIS — Z87440 Personal history of urinary (tract) infections: Secondary | ICD-10-CM | POA: Diagnosis not present

## 2019-08-19 DIAGNOSIS — J302 Other seasonal allergic rhinitis: Secondary | ICD-10-CM | POA: Diagnosis not present

## 2019-08-19 DIAGNOSIS — Z7982 Long term (current) use of aspirin: Secondary | ICD-10-CM | POA: Diagnosis not present

## 2019-08-20 ENCOUNTER — Encounter: Payer: Medicare Other | Admitting: Family Medicine

## 2019-08-21 DIAGNOSIS — J302 Other seasonal allergic rhinitis: Secondary | ICD-10-CM | POA: Diagnosis not present

## 2019-08-21 DIAGNOSIS — Z7982 Long term (current) use of aspirin: Secondary | ICD-10-CM | POA: Diagnosis not present

## 2019-08-21 DIAGNOSIS — Z96653 Presence of artificial knee joint, bilateral: Secondary | ICD-10-CM | POA: Diagnosis not present

## 2019-08-21 DIAGNOSIS — K219 Gastro-esophageal reflux disease without esophagitis: Secondary | ICD-10-CM | POA: Diagnosis not present

## 2019-08-21 DIAGNOSIS — T84090D Other mechanical complication of internal right hip prosthesis, subsequent encounter: Secondary | ICD-10-CM | POA: Diagnosis not present

## 2019-08-21 DIAGNOSIS — E559 Vitamin D deficiency, unspecified: Secondary | ICD-10-CM | POA: Diagnosis not present

## 2019-08-21 DIAGNOSIS — R7303 Prediabetes: Secondary | ICD-10-CM | POA: Diagnosis not present

## 2019-08-21 DIAGNOSIS — E039 Hypothyroidism, unspecified: Secondary | ICD-10-CM | POA: Diagnosis not present

## 2019-08-21 DIAGNOSIS — Z87440 Personal history of urinary (tract) infections: Secondary | ICD-10-CM | POA: Diagnosis not present

## 2019-08-21 DIAGNOSIS — G43909 Migraine, unspecified, not intractable, without status migrainosus: Secondary | ICD-10-CM | POA: Diagnosis not present

## 2019-08-21 DIAGNOSIS — Z85828 Personal history of other malignant neoplasm of skin: Secondary | ICD-10-CM | POA: Diagnosis not present

## 2019-08-21 DIAGNOSIS — E785 Hyperlipidemia, unspecified: Secondary | ICD-10-CM | POA: Diagnosis not present

## 2019-08-28 ENCOUNTER — Other Ambulatory Visit: Payer: Self-pay | Admitting: Family Medicine

## 2019-08-28 ENCOUNTER — Other Ambulatory Visit: Payer: Self-pay

## 2019-08-28 DIAGNOSIS — R748 Abnormal levels of other serum enzymes: Secondary | ICD-10-CM

## 2019-08-28 DIAGNOSIS — E559 Vitamin D deficiency, unspecified: Secondary | ICD-10-CM

## 2019-08-28 DIAGNOSIS — E038 Other specified hypothyroidism: Secondary | ICD-10-CM

## 2019-08-28 DIAGNOSIS — R413 Other amnesia: Secondary | ICD-10-CM

## 2019-08-28 DIAGNOSIS — E785 Hyperlipidemia, unspecified: Secondary | ICD-10-CM

## 2019-08-28 DIAGNOSIS — D649 Anemia, unspecified: Secondary | ICD-10-CM

## 2019-08-29 ENCOUNTER — Other Ambulatory Visit (INDEPENDENT_AMBULATORY_CARE_PROVIDER_SITE_OTHER): Payer: Medicare Other

## 2019-08-29 ENCOUNTER — Ambulatory Visit (INDEPENDENT_AMBULATORY_CARE_PROVIDER_SITE_OTHER): Payer: Medicare Other

## 2019-08-29 VITALS — BP 117/67 | Wt 146.0 lb

## 2019-08-29 DIAGNOSIS — Z0000 Encounter for general adult medical examination without abnormal findings: Secondary | ICD-10-CM

## 2019-08-29 DIAGNOSIS — D649 Anemia, unspecified: Secondary | ICD-10-CM

## 2019-08-29 DIAGNOSIS — R413 Other amnesia: Secondary | ICD-10-CM | POA: Diagnosis not present

## 2019-08-29 DIAGNOSIS — E039 Hypothyroidism, unspecified: Secondary | ICD-10-CM

## 2019-08-29 DIAGNOSIS — E559 Vitamin D deficiency, unspecified: Secondary | ICD-10-CM

## 2019-08-29 DIAGNOSIS — E038 Other specified hypothyroidism: Secondary | ICD-10-CM

## 2019-08-29 DIAGNOSIS — E785 Hyperlipidemia, unspecified: Secondary | ICD-10-CM

## 2019-08-29 LAB — CBC WITH DIFFERENTIAL/PLATELET
Basophils Absolute: 0 10*3/uL (ref 0.0–0.1)
Basophils Relative: 0.6 % (ref 0.0–3.0)
Eosinophils Absolute: 0.4 10*3/uL (ref 0.0–0.7)
Eosinophils Relative: 6.3 % — ABNORMAL HIGH (ref 0.0–5.0)
HCT: 37.9 % (ref 36.0–46.0)
Hemoglobin: 12.5 g/dL (ref 12.0–15.0)
Lymphocytes Relative: 27.6 % (ref 12.0–46.0)
Lymphs Abs: 1.7 10*3/uL (ref 0.7–4.0)
MCHC: 33 g/dL (ref 30.0–36.0)
MCV: 87.9 fl (ref 78.0–100.0)
Monocytes Absolute: 0.6 10*3/uL (ref 0.1–1.0)
Monocytes Relative: 9.8 % (ref 3.0–12.0)
Neutro Abs: 3.3 10*3/uL (ref 1.4–7.7)
Neutrophils Relative %: 55.7 % (ref 43.0–77.0)
Platelets: 427 10*3/uL — ABNORMAL HIGH (ref 150.0–400.0)
RBC: 4.31 Mil/uL (ref 3.87–5.11)
RDW: 13.2 % (ref 11.5–15.5)
WBC: 6 10*3/uL (ref 4.0–10.5)

## 2019-08-29 LAB — COMPREHENSIVE METABOLIC PANEL
ALT: 22 U/L (ref 0–35)
AST: 28 U/L (ref 0–37)
Albumin: 4.5 g/dL (ref 3.5–5.2)
Alkaline Phosphatase: 255 U/L — ABNORMAL HIGH (ref 39–117)
BUN: 16 mg/dL (ref 6–23)
CO2: 26 mEq/L (ref 19–32)
Calcium: 10.1 mg/dL (ref 8.4–10.5)
Chloride: 100 mEq/L (ref 96–112)
Creatinine, Ser: 0.64 mg/dL (ref 0.40–1.20)
GFR: 89.62 mL/min (ref >60.00)
Glucose, Bld: 95 mg/dL (ref 70–99)
Potassium: 4.3 mEq/L (ref 3.5–5.1)
Sodium: 140 mEq/L (ref 135–145)
Total Bilirubin: 0.6 mg/dL (ref 0.2–1.2)
Total Protein: 7.3 g/dL (ref 6.0–8.3)

## 2019-08-29 LAB — LIPID PANEL
Cholesterol: 213 mg/dL — ABNORMAL HIGH (ref 0–200)
HDL: 52.6 mg/dL (ref >39.00)
LDL Cholesterol: 135 mg/dL — ABNORMAL HIGH (ref 0–99)
NonHDL: 160.25
Total CHOL/HDL Ratio: 4
Triglycerides: 125 mg/dL (ref 0.0–149.0)
VLDL: 25 mg/dL (ref 0.0–40.0)

## 2019-08-29 LAB — VITAMIN B12: Vitamin B-12: 1342 pg/mL — ABNORMAL HIGH (ref 211–911)

## 2019-08-29 LAB — TSH: TSH: 3.37 u[IU]/mL (ref 0.35–4.50)

## 2019-08-29 LAB — VITAMIN D 25 HYDROXY (VIT D DEFICIENCY, FRACTURES): VITD: 40.8 ng/mL (ref 30.00–100.00)

## 2019-08-29 LAB — T4, FREE: Free T4: 1.12 ng/dL (ref 0.60–1.60)

## 2019-08-29 NOTE — Patient Instructions (Signed)
Ms. Kristin Kramer , Thank you for taking time to come for your Medicare Wellness Visit. I appreciate your ongoing commitment to your health goals. Please review the following plan we discussed and let me know if I can assist you in the future.   Screening recommendations/referrals: Colonoscopy: due, contact GI office to scheduled appointment Mammogram: Up to date, completed 06/17/2019, due 06/2020 Bone Density: Up to date, completed 09/06/2018, due 09/2020 Recommended yearly ophthalmology/optometry visit for glaucoma screening and checkup Recommended yearly dental visit for hygiene and checkup  Vaccinations: Influenza vaccine: Up to date, completed 11/28/2018, due 10/2019 Pneumococcal vaccine: Completed series Tdap vaccine: Up to date, completed 10/22/2014, due 10/2024 Shingles vaccine: Completed series   Covid-19:Completed series  Advanced directives: copy in chart  Conditions/risks identified: hyperlipidemia  Next appointment: Follow up in one year for your annual wellness visit    Preventive Care 44 Years and Older, Female Preventive care refers to lifestyle choices and visits with your health care provider that can promote health and wellness. What does preventive care include?  A yearly physical exam. This is also called an annual well check.  Dental exams once or twice a year.  Routine eye exams. Ask your health care provider how often you should have your eyes checked.  Personal lifestyle choices, including:  Daily care of your teeth and gums.  Regular physical activity.  Eating a healthy diet.  Avoiding tobacco and drug use.  Limiting alcohol use.  Practicing safe sex.  Taking low-dose aspirin every day.  Taking vitamin and mineral supplements as recommended by your health care provider. What happens during an annual well check? The services and screenings done by your health care provider during your annual well check will depend on your age, overall health, lifestyle  risk factors, and family history of disease. Counseling  Your health care provider may ask you questions about your:  Alcohol use.  Tobacco use.  Drug use.  Emotional well-being.  Home and relationship well-being.  Sexual activity.  Eating habits.  History of falls.  Memory and ability to understand (cognition).  Work and work Statistician.  Reproductive health. Screening  You may have the following tests or measurements:  Height, weight, and BMI.  Blood pressure.  Lipid and cholesterol levels. These may be checked every 5 years, or more frequently if you are over 87 years old.  Skin check.  Lung cancer screening. You may have this screening every year starting at age 17 if you have a 30-pack-year history of smoking and currently smoke or have quit within the past 15 years.  Fecal occult blood test (FOBT) of the stool. You may have this test every year starting at age 36.  Flexible sigmoidoscopy or colonoscopy. You may have a sigmoidoscopy every 5 years or a colonoscopy every 10 years starting at age 85.  Hepatitis C blood test.  Hepatitis B blood test.  Sexually transmitted disease (STD) testing.  Diabetes screening. This is done by checking your blood sugar (glucose) after you have not eaten for a while (fasting). You may have this done every 1-3 years.  Bone density scan. This is done to screen for osteoporosis. You may have this done starting at age 15.  Mammogram. This may be done every 1-2 years. Talk to your health care provider about how often you should have regular mammograms. Talk with your health care provider about your test results, treatment options, and if necessary, the need for more tests. Vaccines  Your health care provider may recommend certain  vaccines, such as:  Influenza vaccine. This is recommended every year.  Tetanus, diphtheria, and acellular pertussis (Tdap, Td) vaccine. You may need a Td booster every 10 years.  Zoster vaccine.  You may need this after age 77.  Pneumococcal 13-valent conjugate (PCV13) vaccine. One dose is recommended after age 13.  Pneumococcal polysaccharide (PPSV23) vaccine. One dose is recommended after age 13. Talk to your health care provider about which screenings and vaccines you need and how often you need them. This information is not intended to replace advice given to you by your health care provider. Make sure you discuss any questions you have with your health care provider. Document Released: 03/20/2015 Document Revised: 11/11/2015 Document Reviewed: 12/23/2014 Elsevier Interactive Patient Education  2017 [place] Prevention in the Home Falls can cause injuries. They can happen to people of all ages. There are many things you can do to make your home safe and to help prevent falls. What can I do on the outside of my home?  Regularly fix the edges of walkways and driveways and fix any cracks.  Remove anything that might make you trip as you walk through a door, such as a raised step or threshold.  Trim any bushes or trees on the path to your home.  Use bright outdoor lighting.  Clear any walking paths of anything that might make someone trip, such as rocks or tools.  Regularly check to see if handrails are loose or broken. Make sure that both sides of any steps have handrails.  Any raised decks and porches should have guardrails on the edges.  Have any leaves, snow, or ice cleared regularly.  Use sand or salt on walking paths during winter.  Clean up any spills in your garage right away. This includes oil or grease spills. What can I do in the bathroom?  Use night lights.  Install grab bars by the toilet and in the tub and shower. Do not use towel bars as grab bars.  Use non-skid mats or decals in the tub or shower.  If you need to sit down in the shower, use a plastic, non-slip stool.  Keep the floor dry. Clean up any water that spills on the floor as soon  as it happens.  Remove soap buildup in the tub or shower regularly.  Attach bath mats securely with double-sided non-slip rug tape.  Do not have throw rugs and other things on the floor that can make you trip. What can I do in the bedroom?  Use night lights.  Make sure that you have a light by your bed that is easy to reach.  Do not use any sheets or blankets that are too big for your bed. They should not hang down onto the floor.  Have a firm chair that has side arms. You can use this for support while you get dressed.  Do not have throw rugs and other things on the floor that can make you trip. What can I do in the kitchen?  Clean up any spills right away.  Avoid walking on wet floors.  Keep items that you use a lot in easy-to-reach places.  If you need to reach something above you, use a strong step stool that has a grab bar.  Keep electrical cords out of the way.  Do not use floor polish or wax that makes floors slippery. If you must use wax, use non-skid floor wax.  Do not have throw rugs and other things  on the floor that can make you trip. What can I do with my stairs?  Do not leave any items on the stairs.  Make sure that there are handrails on both sides of the stairs and use them. Fix handrails that are broken or loose. Make sure that handrails are as long as the stairways.  Check any carpeting to make sure that it is firmly attached to the stairs. Fix any carpet that is loose or worn.  Avoid having throw rugs at the top or bottom of the stairs. If you do have throw rugs, attach them to the floor with carpet tape.  Make sure that you have a light switch at the top of the stairs and the bottom of the stairs. If you do not have them, ask someone to add them for you. What else can I do to help prevent falls?  Wear shoes that:  Do not have high heels.  Have rubber bottoms.  Are comfortable and fit you well.  Are closed at the toe. Do not wear sandals.  If  you use a stepladder:  Make sure that it is fully opened. Do not climb a closed stepladder.  Make sure that both sides of the stepladder are locked into place.  Ask someone to hold it for you, if possible.  Clearly mark and make sure that you can see:  Any grab bars or handrails.  First and last steps.  Where the edge of each step is.  Use tools that help you move around (mobility aids) if they are needed. These include:  Canes.  Walkers.  Scooters.  Crutches.  Turn on the lights when you go into a dark area. Replace any light bulbs as soon as they burn out.  Set up your furniture so you have a clear path. Avoid moving your furniture around.  If any of your floors are uneven, fix them.  If there are any pets around you, be aware of where they are.  Review your medicines with your doctor. Some medicines can make you feel dizzy. This can increase your chance of falling. Ask your doctor what other things that you can do to help prevent falls. This information is not intended to replace advice given to you by your health care provider. Make sure you discuss any questions you have with your health care provider. Document Released: 12/18/2008 Document Revised: 07/30/2015 Document Reviewed: 03/28/2014 Elsevier Interactive Patient Education  2017 [employer].

## 2019-08-29 NOTE — Progress Notes (Signed)
PCP notes:  Health Maintenance: Colononscopy- due, Patient has received letter from GI doctor about having this scheduled.    Abnormal Screenings: none   Patient concerns: Memory loss Discuss recent heart imaging results.    Nurse concerns: none   Next PCP appt.: 09/05/2019 @ 8:30 am

## 2019-08-29 NOTE — Progress Notes (Signed)
Subjective:   Kristin Kramer is a 79 y.o. female who presents for Medicare Annual (Subsequent) preventive examination.  Review of Systems: N/A      I connected with the patient today by telephone and verified that I am speaking with the correct person using two identifiers. Location patient: home Location nurse: work Persons participating in the virtual visit: patient, Marine scientist.   I discussed the limitations, risks, security and privacy concerns of performing an evaluation and management service by telephone and the availability of in person appointments. I also discussed with the patient that there may be a patient responsible charge related to this service. The patient expressed understanding and verbally consented to this telephonic visit.    Interactive audio and video telecommunications were attempted between this nurse and patient, however failed, due to patient having technical difficulties OR patient did not have access to video capability.  We continued and completed visit with audio only.     Cardiac Risk Factors include: advanced age (>79men, >72 women);dyslipidemia     Objective:    Today's Vitals   08/29/19 0939 08/29/19 0945  BP: 117/67   Weight: 146 lb (66.2 kg)   PainSc:  9    Body mass index is 24.3 kg/m.  Advanced Directives 08/29/2019 08/13/2019 08/07/2019 07/29/2019 08/15/2018 12/07/2017 12/07/2017  Does Patient Have a Medical Advance Directive? Yes Yes Yes Yes Yes Yes Yes  Type of Paramedic of [place];Living will [place];Living will Living will;Healthcare Power of Attorney Living will;Healthcare Power of [place];Living will [place];Living will [place]  Does patient want to make changes to medical advance directive? - No - Patient declined No - Patient declined - No - Patient declined No - Patient declined No - Patient declined  Copy of [place] in Chart? Yes - validated most recent copy scanned in chart (See row information) Yes - validated most recent copy scanned in chart (See row information) No - copy requested - No - copy requested No - copy requested Yes    Current Medications (verified) Outpatient Encounter Medications as of 08/29/2019  Medication Sig  . ALPRAZolam (XANAX) 0.25 MG tablet Take 1 tablet (0.25 mg total) by mouth daily as needed for anxiety or sleep.  Marland Kitchen aspirin EC 325 MG EC tablet Take 1 tablet (325 mg total) by mouth 2 (two) times daily for 21 days. Then take aspirin 81 mg daily for another 3 weeks.  . calcium carbonate (OSCAL) 1500 (600 Ca) MG TABS tablet Take 600 mg of elemental calcium by mouth daily with breakfast.  . Cholecalciferol (VITAMIN D-3) 5000 units TABS Take 5,000 Units by mouth daily.   . Coenzyme Q10 (COQ10 PO) Take 1 capsule by mouth daily.   Marland Kitchen donepezil (ARICEPT) 5 MG tablet TAKE 1 TABLET BY MOUTH EVERY DAY  . ezetimibe (ZETIA) 10 MG tablet TAKE 1 TABLET BY MOUTH EVERY DAY  . famotidine (PEPCID) 20 MG tablet Take 1 tablet (20 mg total) by mouth at bedtime.  Marland Kitchen HYDROcodone-acetaminophen (NORCO/VICODIN) 5-325 MG tablet Take 1-2 tablets by mouth every 6 (six) hours as needed for moderate pain (pain score 4-6).  Marland Kitchen levothyroxine (SYNTHROID) 25 MCG tablet TAKE 1 TABLET BY MOUTH DAILY BEFORE BREAKFAST.  Marland Kitchen loratadine (CLARITIN) 10 MG tablet Take 1 tablet (10 mg total) by mouth daily.  . methocarbamol (ROBAXIN) 500 MG tablet Take 1 tablet (500 mg total) by mouth every 6 (six) hours as  needed for muscle spasms.  . montelukast (SINGULAIR) 10 MG tablet TAKE 1 TABLET BY MOUTH EVERYDAY AT BEDTIME  . niacinamide 500 MG tablet Take 1 tablet (500 mg total) by mouth daily.  . ondansetron (ZOFRAN ODT) 8 MG disintegrating tablet Take 1 tablet (8 mg total) by mouth every 8 (eight) hours as needed for nausea or vomiting.  . Probiotic Product (PROBIOTIC PO) Take 1 capsule by mouth daily.   . sertraline  (ZOLOFT) 25 MG tablet ALTERNATE 1 TABLET AND 1.5 TABLETS EVERY OTHER DAY (Patient taking differently: Take 25-37.5 mg by mouth See admin instructions. Take 25 mg by mouth every other day, alternating with 37.5 mg on the alternate days)  . TURMERIC PO Take 900 mg by mouth daily.    No facility-administered encounter medications on file as of 08/29/2019.    Allergies (verified) Blood-group specific substance, Lipitor [atorvastatin], and Pravastatin   History: Past Medical History:  Diagnosis Date  . Abdominal pain, other specified site 10.2014   [facility] ER - mesenteric adenitis vs sclerosing mesenteritis vs nonspecific lymphadenitis  . Abnormal ECG    a. left axis deviation;  b. 12/2011 Echo: EF 55-60%, no rwma, pasp 20mmHg.  . Arthritis   . Closed right hip fracture ([place]) 07/07/2017  . Depression    prior on lexapro, celexa, wellbutrin.  high cymbalta doses cause tremors  . GERD (gastroesophageal reflux disease)   . H/O seasonal allergies   . Herpes zoster 02/19/2014  . History of pneumonia   . History of UTI    12/2011 - Treatment began preoperatively with CIPRO 500mg  BID  . HLD (hyperlipidemia)    statin intolerance  . Hypothyroidism   . Knee osteoarthritis 2013, 2014   bilateral s/p B TKR [facility]emi [place])  . Knee osteoarthritis   . 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 Migraines   . Osteopenia 08/2016   hip -2.4, spine -1.4  . Post-operative nausea and vomiting   . Pre-diabetes   . Refusal of blood transfusions as patient is Jehovah's Witness   . Seasonal allergies    cats, dust, mold, roaches  . 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 Isac Sarna), R tibia - then recurrent x2, L tibia, R tibia x3  . Tubular adenoma of colon 08/2014  . Vitamin D deficiency    Past Surgical History:  Procedure Laterality Date  . AUGMENTATION MAMMAPLASTY    . BREAST ENHANCEMENT SURGERY    .  BUNIONECTOMY  2013   R and L foot  . CATARACT EXTRACTION  2013   R, pending L  . COLONOSCOPY  2007   no records received  . COLONOSCOPY  08/2014   tubular adenoma, rpt 5 yrs [facility])  . CONVERSION TO TOTAL HIP Right 12/07/2017   Procedure: CONVERSION TO TOTAL HIP ( REMOVING SMITH AND NEPHEW NAIL);  Surgeon: Hessie Knows, MD;  Location: [facility] ORS;  Service: Orthopedics;  Laterality: Right;  . COSMETIC SURGERY  2002   face lift  . dexa  06/2008   T -1.4 spine and hip  . ETHMOIDECTOMY Bilateral 05/19/2016   Procedure: ETHMOIDECTOMY;  Surgeon: Carloyn Manner, MD;  Location: [facility] ORS;  Service: ENT;  Laterality: Bilateral;  . EYE SURGERY     bilateral cataract surgery  . FRONTAL SINUS EXPLORATION Bilateral 05/19/2016   Procedure: FRONTAL SINUS EXPLORATION;  Surgeon: Carloyn Manner, MD;  Location: [facility] ORS;  Service: ENT;  Laterality: Bilateral;  .  INTRAMEDULLARY (IM) NAIL INTERTROCHANTERIC Right 07/08/2017   Procedure: RIGHT INTERTROCHANTRIC IM NAIL;  Surgeon: Leandrew Koyanagi, MD;  Location: [place];  Service: Orthopedics;  Laterality: Right;  . MAXILLARY ANTROSTOMY Bilateral 05/19/2016   Procedure: MAXILLARY ANTROSTOMY;  Surgeon: Carloyn Manner, MD;  Location: [facility] ORS;  Service: ENT;  Laterality: Bilateral;  . MOHS SURGERY  2011   R leg  . PARTIAL HYSTERECTOMY  1980   fibroids, ovaries remained  . SINUS ENDO W/FUSION N/A 05/19/2016   Procedure: ENDOSCOPIC SINUS SURGERY WITH NAVIGATION;  Surgeon: Carloyn Manner, MD;  Location: [facility] ORS;  Service: ENT;  Laterality: N/A;  . SPHENOIDECTOMY Bilateral 05/19/2016   Procedure: Coralee Pesa;  Surgeon: Carloyn Manner, MD;  Location: [facility] ORS;  Service: ENT;  Laterality: Bilateral;  . SQUAMOUS CELL CARCINOMA EXCISION Right 07/13/2016   Dr. Rolm Bookbinder, [facility]  . TONSILLECTOMY AND ADENOIDECTOMY  1945  . TOTAL HIP REVISION Right 08/07/2019   Procedure: Right hip acetabular versus total hip arthroplasty revision-posterior;  Surgeon:  Gaynelle Arabian, MD;  Location: [facility];  Service: Orthopedics;  Laterality: Right;  180min  . TOTAL KNEE ARTHROPLASTY  01/02/2012   Right - Surgeon: Lorn Junes, MD  . TOTAL KNEE ARTHROPLASTY Left 03/18/2013   Surgeon: Lorn Junes, MD   Family History  Problem Relation Age of Onset  . Cancer Mother 55       lung, smoker  . Dementia Mother 9  . Cancer Father        lung, smoker  . Cancer Maternal Grandmother 61       leukemia  . Coronary artery disease Paternal Grandmother 61       sudden cardiac death  . Diabetes Neg Hx   . Stroke Neg Hx   . Breast cancer Neg Hx    Social History   Socioeconomic History  . Marital status: Widowed    Spouse name: Not on file  . Number of children: 3  . Years of education: Not on file  . Highest education level: Not on file  Occupational History  . Occupation: Retired  Tobacco Use  . Smoking status: Never Smoker  . Smokeless tobacco: Never Used  Vaping Use  . Vaping Use: Never used  Substance and Sexual Activity  . Alcohol use: Yes    Alcohol/week: 0.0 standard drinks    Comment: occasional  . Drug use: No  . Sexual activity: Not Currently  Other Topics Concern  . Not on file  Social History Narrative   Lives alone.  Brother lives nearby.   Occupation: retired, was Network engineer and housewife   Edu: 1 yr college   Activity: limited by knee   Diet: good water, fruits/vegetables daily   Religion: Jehova's witness   Social Determinants of Health   Financial Resource Strain: Low Risk   . Difficulty of Paying Living Expenses: Not hard at all  Food Insecurity: No Food Insecurity  . Worried About Charity fundraiser in the Last Year: Never true  . Ran Out of Food in the Last Year: Never true  Transportation Needs: No Transportation Needs  . Lack of Transportation (Medical): No  . Lack of Transportation (Non-Medical): No  Physical Activity: Insufficiently Active  . Days of Exercise per Week: 3 days  . Minutes of Exercise per  Session: 40 min  Stress: No Stress Concern Present  . Feeling of Stress : Not at all  Social Connections:   . Frequency of Communication with Friends and Family:   .  Frequency of Social Gatherings with Friends and Family:   . Attends Religious Services:   . Active Member of Clubs or Organizations:   . Attends Archivist Meetings:   Marland Kitchen Marital Status:     Tobacco Counseling Counseling given: Not Answered   Clinical Intake:  Pre-visit preparation completed: Yes  Pain : 0-10 Pain Score: 9  Pain Type: Chronic pain Pain Location: Hip Pain Orientation: Right Pain Descriptors / Indicators: Aching Pain Onset: 1 to 4 weeks ago Pain Frequency: Intermittent     Nutritional Risks: None Diabetes: No  How often do you need to have someone help you when you read instructions, pamphlets, or other written materials from your doctor or pharmacy?: 1 - Never What is the last grade level you completed in school?: 1 year of business college  Diabetic: No Nutrition Risk Assessment:  Has the patient had any N/V/D within the last 2 months?  No  Does the patient have any non-healing wounds?  No  Has the patient had any unintentional weight loss or weight gain?  No   Diabetes:  Is the patient diabetic?  No  If diabetic, was a CBG obtained today?  No  Did the patient bring in their glucometer from home?  No  How often do you monitor your CBG's? N/A.   Financial Strains and Diabetes Management:  Are you having any financial strains with the device, your supplies or your medication? No .  Does the patient want to be seen by Chronic Care Management for management of their diabetes?  No  Would the patient like to be referred to a Nutritionist or for Diabetic Management?  No     Interpreter Needed?: No  Information entered by :: CJohnson, LPN   Activities of Daily Living In your present state of health, do you have any difficulty performing the following activities: 08/29/2019  08/07/2019  Hearing? N N  Vision? N N  Difficulty concentrating or making decisions? Y N  Comment has trouble focusing and remembering things -  Walking or climbing stairs? N N  Dressing or bathing? N N  Doing errands, shopping? N N  Preparing Food and eating ? N -  Using the Toilet? N -  In the past six months, have you accidently leaked urine? Y -  Comment wears pads -  Do you have problems with loss of bowel control? N -  Managing your Medications? N -  Managing your Finances? N -  Housekeeping or managing your Housekeeping? N -  Some recent data might be hidden    Patient Care Team: [name], MD as PCP - General (Family Medicine) [name], [place] as Consulting Physician (Optometry) [name], [facility] as Pharmacist (Pharmacist) Serena Colonel, RN as [place] Management  Indicate any recent Medical Services you may have received from other than [facility] providers in the past year (date may be approximate).     Assessment:   This is a routine wellness examination for Annalie.  Hearing/Vision screen  Hearing Screening   125Hz  250Hz  500Hz  1000Hz  2000Hz  3000Hz  4000Hz  6000Hz  8000Hz   Right ear:           Left ear:           Vision Screening Comments: Patient gets annual eye exams  Dietary issues and exercise activities discussed: Current Exercise Habits: Home exercise routine, Type of exercise: walking, Time (Minutes): 45, Frequency (Times/Week): 3, Weekly Exercise (Minutes/Week): 135, Intensity: Moderate, Exercise limited by: None identified  Goals    .  Patient Stated     Starting 08/15/2018, I will continue to take medications as prescribed.     . Patient Stated     08/29/2019, I will continue to walk at least 3 days a week for 45 minutes.    . Pharmacy Care Plan     CARE PLAN ENTRY  Current Barriers:  . Chronic Disease Management support, education, and care coordination needs related to chronic neck and hip pain, osteopenia, elevated  TSH  Pharmacist Clinical Goal(s):  Marland Kitchen Over the next 3 months, patient will work with PharmD and primary care provider to address the following goals: o Address patient concern:  Improve fatigue, hair loss, cold intolerance through medication management for elevated TSH  o Osteopenia: Achieve daily intake of calcium (1200 mg) through dietary choices; Examples include 4 servings of yogurt, cheese, or low fat milk daily as well as certain leafy green vegetables, nuts, and fortified cereals.  o Chronic neck pain: Improve pain level and frequency (currently severe and daily pain) by scheduling Tylenol 500 mg, 1 tablet every 4-6 hours (three-four times daily)  Interventions: . Comprehensive medication review performed . Consult with primary care provider for medication management/hyroid hormone supplement   Patient Self Care Activities:  For the next 3 months until follow up visit:  . Achieve daily calcium intake of 1200 mg from dietary choices . Continue to walk 2-3 days per week for 45-60 minutes with goal of 150 minutes per week; May reduce intervals to 30 minutes to reduce hip pain . Continue to take medications as prescribed  Initial goal documentation    . Pharmacy Care Plan: Hyperlipidemia     CARE PLAN ENTRY  Current Barriers:  . Uncontrolled hyperlipidemia . Current antihyperlipidemic regimen:   Zetia 10 mg - 1 tablet daily (started 08/2018)  Omega 3 fatty acids 1600 mg - 1 capsule daily . Previous antihyperlipidemic medications tried atorvastatin, pravastatin (muscle cramps) . Most recent lipid panel:     Component Value Date/Time   CHOL 221 (H) 12/17/2018 0813   TRIG 137.0 12/17/2018 0813   TRIG 97 06/24/2011 0000   HDL 58.30 12/17/2018 0813   CHOLHDL 4 12/17/2018 0813   VLDL 27.4 12/17/2018 0813   LDLCALC 136 (H) 12/17/2018 0813   LDLDIRECT 146.4 08/13/2012 0826 .  ASCVD risk enhancing conditions: age >79, LDL > 100 . 10-year ASCVD risk score: 26.1% (this indicates  that among 100 patients with the similar risk factors to you, 26 would be expected to have a heart attack or stroke in the next 10 years); This risk may be reduced with a statin medication, achieving 150 minutes of exercise weekly, and a heart healthy diet  Pharmacist Clinical Goal(s):  Marland Kitchen Over the next 3 months, patient will work with PharmD and primary care providers towards the following goals: o Achieving LDL (bad cholesterol) less than 100 mg/dL o Exercising for 150 minutes per week o Adhering to diet emphasizing intake of vegetables, fruits, legumes, nuts, whole grains, and fish and reducing intake of high cholesterol content foods  Interventions: . Comprehensive medication review performed; medication list updated in electronic medical record.  . Discussed cholesterol goals, diet, and exercise  . Discussed benefits of once weekly statin with reduced adverse effects  Patient Self Care Activities:  . Patient will continue to walk for 45-60 minutes 2-3 days per week or sum total of 150 minutes per week in shorter intervals  . Please let me know if you are willing and interested in trying a  once weekly statin to reduce your cardiovascular risk   Initial goal documentation      Depression Screen PHQ 2/9 Scores 08/29/2019 08/15/2018 08/09/2017 08/05/2016 08/04/2015 07/31/2014 01/14/2013  PHQ - 2 Score 0 0 0 3 2 0 3  PHQ- 9 Score 0 0 0 12 13 - 4    Fall Risk Fall Risk  08/29/2019 08/15/2018 08/09/2017 09/23/2016 08/05/2016  Falls in the past year? 0 1 Yes Yes Yes  Comment - fell in parking lot; fractures to ribs and stitches to face - Emmi Telephone Survey: data to providers prior to load pt reports 2 falls; both with injury and 1 with medical treatment  Number falls in past yr: 0 0 1 2 or more 2 or more  Comment - - - Emmi Telephone Survey Actual Response = 20 -  Injury with Fall? 0 1 Yes Yes Yes  Risk for fall due to : Medication side effect - - - -  Follow up Falls evaluation completed;Falls  prevention discussed - - - -    Any stairs in or around the home? Yes  If so, are there any without handrails? No  Home free of loose throw rugs in walkways, pet beds, electrical cords, etc? Yes  Adequate lighting in your home to reduce risk of falls? Yes   ASSISTIVE DEVICES UTILIZED TO PREVENT FALLS:  Life alert? No  Use of a cane, walker or w/c? Yes  Grab bars in the bathroom? Yes  Shower chair or bench in shower? Yes  Elevated toilet seat or a handicapped toilet? No   TIMED UP AND GO:  Was the test performed? N/A, telephonic visit .    Cognitive Function: MMSE - Mini Mental State Exam 08/29/2019 08/15/2018 08/09/2017 08/05/2016  Not completed: Refused - - -  Orientation to time - 5 5 5   Orientation to Place - 5 5 5   Registration - 3 3 3   Attention/ Calculation - 0 0 0  Recall - 3 3 3   Language- name 2 objects - 0 0 0  Language- repeat - 1 1 1   Language- follow 3 step command - 0 3 2  Language- follow 3 step command-comments - - - pt was unable to follow 1 step of 3 step command  Language- read & follow direction - 0 0 0  Write a sentence - 0 0 0  Copy design - 0 0 0  Total score - 17 20 19   Mini Cog  Mini-Cog screen was not completed. Patient refused.  Maximum score is 22. A value of 0 denotes this part of the MMSE was not completed or the patient failed this part of the Mini-Cog screening.       Immunizations Immunization History  Administered Date(s) Administered  . Influenza Split 12/01/2011  . Influenza, High Dose Seasonal PF 02/10/2014, 11/27/2017, 11/28/2018  . Influenza,inj,Quad PF,6+ Mos 01/14/2013, 12/29/2014, 02/04/2016  . Influenza-Unspecified 01/03/2017  . PFIZER SARS-COV-2 Vaccination 04/15/2019, 05/06/2019  . Pneumococcal Conjugate-13 07/31/2014  . Pneumococcal Polysaccharide-23 12/01/2011  . Tdap 10/22/2014  . Zoster 02/14/2013  . Zoster Recombinat (Shingrix) 11/29/2016, 10/21/2017    TDAP status: Up to date Flu Vaccine status: Up to  date Pneumococcal vaccine status: Up to date Covid-19 vaccine status: Completed vaccines  Qualifies for Shingles Vaccine? Yes   Zostavax completed Yes   Shingrix Completed?: Yes  Screening Tests Health Maintenance  Topic Date Due  . DTAP VACCINES (1) 04/15/1941  . COLONOSCOPY  08/28/2019  . INFLUENZA VACCINE  10/06/2019  .  MAMMOGRAM  06/16/2020  . DTaP/Tdap/Td (2 - Td or Tdap) 10/21/2024  . TETANUS/TDAP  10/21/2024  . DEXA SCAN  Completed  . COVID-19 Vaccine  Completed  . Hepatitis C Screening  Completed  . PNA vac Low Risk Adult  Completed    Health Maintenance  Health Maintenance Due  Topic Date Due  . DTAP VACCINES (1) 04/15/1941  . COLONOSCOPY  08/28/2019    Colorectal cancer screening: due, GI office has already reached out to patient to have this scheduled. Mammogram status: Completed 06/17/2019. Repeat every year Bone Density status: Completed 09/06/2018. Results reflect: Bone density results: OSTEOPENIA. Repeat every 2 years.  Lung Cancer Screening: (Low Dose CT Chest recommended if Age 39-80 years, 30 pack-year currently smoking OR have quit w/in 15 years.) does not qualify.   Additional Screening:  Hepatitis C Screening: does qualify; Completed 01/03/2013  Vision Screening: Recommended annual ophthalmology exams for early detection of glaucoma and other disorders of the eye. Is the patient up to date with their annual eye exam?  Yes  Who is the provider or what is the name of the office in which the patient attends annual eye exams? Dr. Garlan Fair If pt is not established with a provider, would they like to be referred to a provider to establish care? No .   Dental Screening: Recommended annual dental exams for proper oral hygiene  Community Resource Referral / Chronic Care Management: CRR required this visit?  No   CCM required this visit?  No      Plan:     I have personally reviewed and noted the following in the patient's chart:   . Medical and  social history . Use of alcohol, tobacco or illicit drugs  . Current medications and supplements . Functional ability and status . Nutritional status . Physical activity . Advanced directives . List of other physicians . Hospitalizations, surgeries, and ER visits in previous 12 months . Vitals . Screenings to include cognitive, depression, and falls . Referrals and appointments  In addition, I have reviewed and discussed with patient certain preventive protocols, quality metrics, and best practice recommendations. A written personalized care plan for preventive services as well as general preventive health recommendations were provided to patient.   Due to this being a telephonic visit, the after visit summary with patients personalized plan was offered to patient via mail or my-chart. Patient preferred to pick up at office at next visit.   Andrez Grime, LPN   [id number]

## 2019-08-30 DIAGNOSIS — Z96641 Presence of right artificial hip joint: Secondary | ICD-10-CM | POA: Diagnosis not present

## 2019-08-30 DIAGNOSIS — Z471 Aftercare following joint replacement surgery: Secondary | ICD-10-CM | POA: Diagnosis not present

## 2019-09-05 ENCOUNTER — Other Ambulatory Visit: Payer: Self-pay

## 2019-09-05 ENCOUNTER — Encounter: Payer: Self-pay | Admitting: Family Medicine

## 2019-09-05 ENCOUNTER — Ambulatory Visit (INDEPENDENT_AMBULATORY_CARE_PROVIDER_SITE_OTHER): Payer: Medicare Other | Admitting: Family Medicine

## 2019-09-05 VITALS — BP 120/70 | HR 87 | Temp 97.7°F | Ht 64.5 in | Wt 149.0 lb

## 2019-09-05 DIAGNOSIS — M8580 Other specified disorders of bone density and structure, unspecified site: Secondary | ICD-10-CM | POA: Diagnosis not present

## 2019-09-05 DIAGNOSIS — E559 Vitamin D deficiency, unspecified: Secondary | ICD-10-CM | POA: Diagnosis not present

## 2019-09-05 DIAGNOSIS — Z0000 Encounter for general adult medical examination without abnormal findings: Secondary | ICD-10-CM | POA: Diagnosis not present

## 2019-09-05 DIAGNOSIS — F331 Major depressive disorder, recurrent, moderate: Secondary | ICD-10-CM | POA: Diagnosis not present

## 2019-09-05 DIAGNOSIS — E038 Other specified hypothyroidism: Secondary | ICD-10-CM

## 2019-09-05 DIAGNOSIS — E785 Hyperlipidemia, unspecified: Secondary | ICD-10-CM

## 2019-09-05 DIAGNOSIS — R413 Other amnesia: Secondary | ICD-10-CM

## 2019-09-05 DIAGNOSIS — E039 Hypothyroidism, unspecified: Secondary | ICD-10-CM

## 2019-09-05 DIAGNOSIS — T84010D Broken internal right hip prosthesis, subsequent encounter: Secondary | ICD-10-CM

## 2019-09-05 DIAGNOSIS — K219 Gastro-esophageal reflux disease without esophagitis: Secondary | ICD-10-CM | POA: Insufficient documentation

## 2019-09-05 DIAGNOSIS — R748 Abnormal levels of other serum enzymes: Secondary | ICD-10-CM

## 2019-09-05 DIAGNOSIS — R0789 Other chest pain: Secondary | ICD-10-CM

## 2019-09-05 NOTE — Assessment & Plan Note (Signed)
Elevation correlates to recent hip surgery.

## 2019-09-05 NOTE — Patient Instructions (Addendum)
Blood work is looking good today! Continue current regimen. Thyroid function is much better.  Drop b12 to Mon/Wed/Fri dosing.  Take omeprazole 20mg  over the counter daily while on naprosyn. Drop naprosyn to once daily add tylenol or just change to tylenol. Let me know if ongoing chest discomfort despite this.   Health Maintenance After Age 79 After age 79, you are at a higher risk for certain long-term diseases and infections as well as injuries from falls. Falls are a major cause of broken bones and head injuries in people who are older than age 63. Getting regular preventive care can help to keep you healthy and well. Preventive care includes getting regular testing and making lifestyle changes as recommended by your health care provider. Talk with your health care provider about:  Which screenings and tests you should have. A screening is a test that checks for a disease when you have no symptoms.  A diet and exercise plan that is right for you. What should I know about screenings and tests to prevent falls? Screening and testing are the best ways to find a health problem early. Early diagnosis and treatment give you the best chance of managing medical conditions that are common after age 73. Certain conditions and lifestyle choices may make you more likely to have a fall. Your health care provider may recommend:  Regular vision checks. Poor vision and conditions such as cataracts can make you more likely to have a fall. If you wear glasses, make sure to get your prescription updated if your vision changes.  Medicine review. Work with your health care provider to regularly review all of the medicines you are taking, including over-the-counter medicines. Ask your health care provider about any side effects that may make you more likely to have a fall. Tell your health care provider if any medicines that you take make you feel dizzy or sleepy.  Osteoporosis screening. Osteoporosis is a condition  that causes the bones to get weaker. This can make the bones weak and cause them to break more easily.  Blood pressure screening. Blood pressure changes and medicines to control blood pressure can make you feel dizzy.  Strength and balance checks. Your health care provider may recommend certain tests to check your strength and balance while standing, walking, or changing positions.  Foot health exam. Foot pain and numbness, as well as not wearing proper footwear, can make you more likely to have a fall.  Depression screening. You may be more likely to have a fall if you have a fear of falling, feel emotionally low, or feel unable to do activities that you used to do.  Alcohol use screening. Using too much alcohol can affect your balance and may make you more likely to have a fall. What actions can I take to lower my risk of falls? General instructions  Talk with your health care provider about your risks for falling. Tell your health care provider if: ? You fall. Be sure to tell your health care provider about all falls, even ones that seem minor. ? You feel dizzy, sleepy, or off-balance.  Take over-the-counter and prescription medicines only as told by your health care provider. These include any supplements.  Eat a healthy diet and maintain a healthy weight. A healthy diet includes low-fat dairy products, low-fat (lean) meats, and fiber from whole grains, beans, and lots of fruits and vegetables. Home safety  Remove any tripping hazards, such as rugs, cords, and clutter.  Install safety equipment such  as grab bars in bathrooms and safety rails on stairs.  Keep rooms and walkways well-lit. Activity   Follow a regular exercise program to stay fit. This will help you maintain your balance. Ask your health care provider what types of exercise are appropriate for you.  If you need a cane or walker, use it as recommended by your health care provider.  Wear supportive shoes that have  nonskid soles. Lifestyle  Do not drink alcohol if your health care provider tells you not to drink.  If you drink alcohol, limit how much you have: ? 0-1 drink a day for women. ? 0-2 drinks a day for men.  Be aware of how much alcohol is in your drink. In the [place] one drink equals one typical bottle of beer (12 oz), one-half glass of wine (5 oz), or one shot of hard liquor (1 oz).  Do not use any products that contain nicotine or tobacco, such as cigarettes and e-cigarettes. If you need help quitting, ask your health care provider. Summary  Having a healthy lifestyle and getting preventive care can help to protect your health and wellness after age 41.  Screening and testing are the best way to find a health problem early and help you avoid having a fall. Early diagnosis and treatment give you the best chance for managing medical conditions that are more common for people who are older than age 66.  Falls are a major cause of broken bones and head injuries in people who are older than age 66. Take precautions to prevent a fall at home.  Work with your health care provider to learn what changes you can make to improve your health and wellness and to prevent falls. This information is not intended to replace advice given to you by your health care provider. Make sure you discuss any questions you have with your health care provider. Document Revised: 06/14/2018 Document Reviewed: 01/04/2017 Elsevier Patient Education  2020 [employer].

## 2019-09-05 NOTE — Progress Notes (Signed)
This visit was conducted in person.  BP 120/70 (BP Location: Left Arm, Patient Position: Sitting, Cuff Size: Normal)   Pulse 87   Temp 97.7 F (36.5 C) (Temporal)   Ht 5' 4.5" (1.638 m)   Wt 149 lb (67.6 kg)   SpO2 99%   BMI 25.18 kg/m    CC: CPE Subjective:    Patient ID: Kristin Kramer, female    DOB: 12/07/1940, 79 y.o.   MRN: [id number]  HPI: Kristin Kramer is a 79 y.o. female presenting on 09/05/2019 for Annual Exam (Prt 2. )   Saw health advisor last week for medicare wellness visit. Note reviewed.    No exam data present    Clinical Support from 08/29/2019 in [place] at [facility] Total Score 0      Fall Risk  08/29/2019 08/15/2018 08/09/2017 09/23/2016 08/05/2016  Falls in the past year? 0 1 Yes Yes Yes  Comment - fell in parking lot; fractures to ribs and stitches to face - Emmi Telephone Survey: data to providers prior to load pt reports 2 falls; both with injury and 1 with medical treatment  Number falls in past yr: 0 0 1 2 or more 2 or more  Comment - - - Emmi Telephone Survey Actual Response = 20 -  Injury with Fall? 0 1 Yes Yes Yes  Risk for fall due to : Medication side effect - - - -  Follow up Falls evaluation completed;Falls prevention discussed - - - -    Recent R hip revision surgery by Dr Maureen Ralphs last month - recently started naprosyn for hip discomfort. Since then, notes some left chest discomfort described as ache without burning, worse in the morning attributes to sleeping supine after recent hip surgery.   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 Planning to reschedule.  Well woman exam - 2012 by prior PCP. Hysterectomy for fibroids, ovaries remained. Menopause early 66s.Recent CT with normal adnexal region. Mammogram Birads1 06/2019 DEXADate: 06/2008 T -1.4 spine and hip  DEXA 08/2016 -2.4 hip, -1.4 spine  DEXA 08/2018 - T -2.1 at hip Lung cancer screening - not eligible.  Flu shot yearly    Pneumovax 2013, prevnar 2016  COVID vaccine - completed [place] 05/2019.  Tdap 10/2014  zostavax - 02/2013  Shingrix -11/2016, 10/2017 Advanced directives -reviewed and scanned 08/2017. Lists Marvis Terance Hart and [employer] as HCPOA. Jehova's witness - no blood transfusions but would want minor fractions of blood discussed with her. Does not want life prolonged if hopeless situation. Seat belt use discussed  Sunscreen use discussed. No changing moles on skin. Sees Dr Ubaldo Glassing. Non smoker  Alcohol - 0-2 drinks per day  Dentist - Q6 months Eye exam - yearly  Bowel - no constipation Bladder - ongoing urinary incontinence day and night progressive over the past 2 years, worse with stress symptoms, wears pad.   Lives alone. Brother lives nearby.  Occupation: retired, was Network engineer and housewife  Edu: 1 yr college  Activity: limited by knee osteoarthritis, [place] [employer] [place].  Diet: good water, fruits/vegetables daily     Relevant past medical, surgical, family and social history reviewed and updated as indicated. Interim medical history since our last visit reviewed. Allergies and medications reviewed and updated. Outpatient Medications Prior to Visit  Medication Sig Dispense Refill  . ALPRAZolam (XANAX) 0.25 MG tablet Take 1 tablet (0.25 mg total) by mouth daily as needed for anxiety or sleep. 20 tablet 1  .  calcium carbonate (OSCAL) 1500 (600 Ca) MG TABS tablet Take 600 mg of elemental calcium by mouth daily with breakfast.    . Cholecalciferol (VITAMIN D-3) 5000 units TABS Take 5,000 Units by mouth daily.     . Coenzyme Q10 (COQ10 PO) Take 1 capsule by mouth daily.     Marland Kitchen donepezil (ARICEPT) 5 MG tablet TAKE 1 TABLET BY MOUTH EVERY DAY 90 tablet 3  . ezetimibe (ZETIA) 10 MG tablet TAKE 1 TABLET BY MOUTH EVERY DAY 90 tablet 0  . famotidine (PEPCID) 20 MG tablet Take 1 tablet (20 mg total) by mouth at bedtime. (Patient taking differently: Take 20 mg by mouth at bedtime. As needed)    .  HYDROcodone-acetaminophen (NORCO/VICODIN) 5-325 MG tablet Take 1-2 tablets by mouth every 6 (six) hours as needed for moderate pain (pain score 4-6). 56 tablet 0  . levothyroxine (SYNTHROID) 25 MCG tablet TAKE 1 TABLET BY MOUTH DAILY BEFORE BREAKFAST. 90 tablet 0  . loratadine (CLARITIN) 10 MG tablet Take 1 tablet (10 mg total) by mouth daily.    . montelukast (SINGULAIR) 10 MG tablet TAKE 1 TABLET BY MOUTH EVERYDAY AT BEDTIME 90 tablet 0  . naproxen (NAPROSYN) 250 MG tablet Take by mouth 2 (two) times daily with a meal.    . niacinamide 500 MG tablet Take 1 tablet (500 mg total) by mouth daily.    . ondansetron (ZOFRAN ODT) 8 MG disintegrating tablet Take 1 tablet (8 mg total) by mouth every 8 (eight) hours as needed for nausea or vomiting. 30 tablet 1  . Probiotic Product (PROBIOTIC PO) Take 1 capsule by mouth daily.     . sertraline (ZOLOFT) 25 MG tablet ALTERNATE 1 TABLET AND 1.5 TABLETS EVERY OTHER DAY (Patient taking differently: Take 25-37.5 mg by mouth See admin instructions. Take 25 mg by mouth every other day, alternating with 37.5 mg on the alternate days) 113 tablet 1  . TURMERIC PO Take 900 mg by mouth daily.     . methocarbamol (ROBAXIN) 500 MG tablet Take 1 tablet (500 mg total) by mouth every 6 (six) hours as needed for muscle spasms. 40 tablet 0   No facility-administered medications prior to visit.     Per HPI unless specifically indicated in ROS section below Review of Systems  Constitutional: Negative for activity change, appetite change, chills, fatigue, fever and unexpected weight change.  HENT: Negative for hearing loss.   Eyes: Negative for visual disturbance.  Respiratory: Negative for cough, chest tightness, shortness of breath and wheezing.   Cardiovascular: Positive for chest pain and leg swelling (right ankle). Negative for palpitations.  Gastrointestinal: Negative for abdominal distention, abdominal pain, blood in stool, constipation, diarrhea, nausea and vomiting.   Genitourinary: Negative for difficulty urinating and hematuria.  Musculoskeletal: Negative for arthralgias, myalgias and neck pain.  Skin: Negative for rash.  Neurological: Negative for dizziness, seizures, syncope and headaches.  Hematological: Negative for adenopathy. Does not bruise/bleed easily.  Psychiatric/Behavioral: Negative for dysphoric mood. The patient is not nervous/anxious.    Objective:  BP 120/70 (BP Location: Left Arm, Patient Position: Sitting, Cuff Size: Normal)   Pulse 87   Temp 97.7 F (36.5 C) (Temporal)   Ht 5' 4.5" (1.638 m)   Wt 149 lb (67.6 kg)   SpO2 99%   BMI 25.18 kg/m   Wt Readings from Last 3 Encounters:  09/05/19 149 lb (67.6 kg)  08/29/19 146 lb (66.2 kg)  08/07/19 150 lb (68 kg)      Physical Exam  Vitals and nursing note reviewed.  Constitutional:      General: She is not in acute distress.    Appearance: Normal appearance. She is well-developed. She is not ill-appearing.  HENT:     Head: Normocephalic and atraumatic.     Right Ear: Hearing, tympanic membrane, ear canal and external ear normal.     Left Ear: Hearing, tympanic membrane, ear canal and external ear normal.  Eyes:     General: No scleral icterus.    Extraocular Movements: Extraocular movements intact.     Conjunctiva/sclera: Conjunctivae normal.     Pupils: Pupils are equal, round, and reactive to light.  Neck:     Thyroid: No thyromegaly or thyroid tenderness.  Cardiovascular:     Rate and Rhythm: Normal rate and regular rhythm.     Pulses: Normal pulses.          Radial pulses are 2+ on the right side and 2+ on the left side.     Heart sounds: Normal heart sounds. No murmur heard.   Pulmonary:     Effort: Pulmonary effort is normal. No respiratory distress.     Breath sounds: No wheezing, rhonchi or rales.  Abdominal:     General: Abdomen is flat. Bowel sounds are normal. There is no distension.     Palpations: Abdomen is soft. There is no mass.     Tenderness:  There is no abdominal tenderness. There is no guarding or rebound.     Hernia: No hernia is present.  Musculoskeletal:        General: Normal range of motion.     Cervical back: Normal range of motion and neck supple.     Right lower leg: Edema (tr) present.     Left lower leg: No edema.  Lymphadenopathy:     Cervical: No cervical adenopathy.  Skin:    General: Skin is warm and dry.     Findings: No rash.  Neurological:     General: No focal deficit present.     Mental Status: She is alert and oriented to person, place, and time.     Comments: CN grossly intact, station and gait intact  Psychiatric:        Mood and Affect: Mood normal.        Behavior: Behavior normal.        Thought Content: Thought content normal.        Judgment: Judgment normal.       Results for orders placed or performed in visit on 08/29/19  Vitamin B12  Result Value Ref Range   Vitamin B-12 1,342 (H) 211 - 911 pg/mL  CBC with Differential/Platelet  Result Value Ref Range   WBC 6.0 4.0 - 10.5 K/uL   RBC 4.31 3.87 - 5.11 Mil/uL   Hemoglobin 12.5 12.0 - 15.0 g/dL   HCT 37.9 36 - 46 %   MCV 87.9 78.0 - 100.0 fl   MCHC 33.0 30.0 - 36.0 g/dL   RDW 13.2 11.5 - 15.5 %   Platelets 427.0 (H) 150 - 400 K/uL   Neutrophils Relative % 55.7 43 - 77 %   Lymphocytes Relative 27.6 12 - 46 %   Monocytes Relative 9.8 3 - 12 %   Eosinophils Relative 6.3 (H) 0 - 5 %   Basophils Relative 0.6 0 - 3 %   Neutro Abs 3.3 1.4 - 7.7 K/uL   Lymphs Abs 1.7 0.7 - 4.0 K/uL   Monocytes Absolute 0.6 0 - 1  K/uL   Eosinophils Absolute 0.4 0 - 0 K/uL   Basophils Absolute 0.0 0 - 0 K/uL  VITAMIN D 25 Hydroxy (Vit-D Deficiency, Fractures)  Result Value Ref Range   VITD 40.80 30.00 - 100.00 ng/mL  T4, free  Result Value Ref Range   Free T4 1.12 0.60 - 1.60 ng/dL  TSH  Result Value Ref Range   TSH 3.37 0.35 - 4.50 uIU/mL  Comprehensive metabolic panel  Result Value Ref Range   Sodium 140 135 - 145 mEq/L   Potassium 4.3 3.5 -  5.1 mEq/L   Chloride 100 96 - 112 mEq/L   CO2 26 19 - 32 mEq/L   Glucose, Bld 95 70 - 99 mg/dL   BUN 16 6 - 23 mg/dL   Creatinine, Ser 0.64 0.40 - 1.20 mg/dL   Total Bilirubin 0.6 0.2 - 1.2 mg/dL   Alkaline Phosphatase 255 (H) 39 - 117 U/L   AST 28 0 - 37 U/L   ALT 22 0 - 35 U/L   Total Protein 7.3 6.0 - 8.3 g/dL   Albumin 4.5 3.5 - 5.2 g/dL   GFR 89.62 >60.00 mL/min   Calcium 10.1 8.4 - 10.5 mg/dL  Lipid panel  Result Value Ref Range   Cholesterol 213 (H) 0 - 200 mg/dL   Triglycerides 125.0 0 - 149 mg/dL   HDL 52.60 >39.00 mg/dL   VLDL 25.0 0.0 - 40.0 mg/dL   LDL Cholesterol 135 (H) 0 - 99 mg/dL   Total CHOL/HDL Ratio 4    NonHDL 160.25    Assessment & Plan:  This visit occurred during the SARS-CoV-2 public health emergency.  Safety protocols were in place, including screening questions prior to the visit, additional usage of staff PPE, and extensive cleaning of exam room while observing appropriate contact time as indicated for disinfecting solutions.   Problem List Items Addressed This Visit    Vitamin D deficiency    Continue vit D replacement.       Subclinical hypothyroidism    Has noted improvement with low dose levothyroxine and levels are now normal - continue      Osteopenia    Continue calcium, vit D Pt motivated to increase weight bearing exercise as hip continues to heal.      Memory deficit    Very stable period without change on aricept 5mg  daily.       MDD (major depressive disorder), recurrent episode, moderate (HCC)    Stable period on alternating sertraline 25/37.5mg        HLD (hyperlipidemia)    Chronic, improved on zetia, niacin. Off fish oil. Statin intolerant.  The 10-year ASCVD risk score Mikey Bussing DC Brooke Bonito., et al., 2013) is: 19.4%   Values used to calculate the score:     Age: 89 years     Sex: Female     Is Non-Hispanic African American: No     Diabetic: No     Tobacco smoker: No     Systolic Blood Pressure: 974 mmHg     Is BP treated:  No     HDL Cholesterol: 52.6 mg/dL     Total Cholesterol: 213 mg/dL       Health maintenance examination - Primary    Preventative protocols reviewed and updated unless pt declined. Discussed healthy diet and lifestyle.       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      Chest discomfort    Anticipate GERD related given worse since starting naprosyn, 1 month out from surgery. Recommend taper down NSAID, use tylenol in its place, rec start omeprazole 20mg  daily while on NSAID, may add pepcid at night.           No orders of the defined types were placed in this encounter.  No orders of the defined types were placed in this encounter.   Patient instructions: Blood work is looking good today! Continue current regimen. Thyroid function is much better.  Drop b12 to Mon/Wed/Fri dosing.  Take omeprazole 20mg  over the counter daily while on naprosyn. Drop naprosyn to once daily add tylenol or just change to tylenol. Let me know if ongoing chest discomfort despite this.   Follow up plan: Return in about 6 months (around 03/07/2020) for follow up visit.  [name], MD

## 2019-09-05 NOTE — Assessment & Plan Note (Signed)
Anticipate GERD related given worse since starting naprosyn, 1 month out from surgery. Recommend taper down NSAID, use tylenol in its place, rec start omeprazole 20mg  daily while on NSAID, may add pepcid at night.

## 2019-09-05 NOTE — Assessment & Plan Note (Signed)
Preventative protocols reviewed and updated unless pt declined. Discussed healthy diet and lifestyle.  

## 2019-09-05 NOTE — Assessment & Plan Note (Signed)
S/p partial hip revision ([facility])

## 2019-09-05 NOTE — Assessment & Plan Note (Signed)
Very stable period without change on aricept 5mg  daily.

## 2019-09-05 NOTE — Assessment & Plan Note (Signed)
Has noted improvement with low dose levothyroxine and levels are now normal - continue

## 2019-09-05 NOTE — Assessment & Plan Note (Signed)
Continue calcium, vit D Pt motivated to increase weight bearing exercise as hip continues to heal.

## 2019-09-05 NOTE — Assessment & Plan Note (Signed)
Stable period on alternating sertraline 25/37.5mg 

## 2019-09-05 NOTE — Assessment & Plan Note (Signed)
Continue vit D replacement.  

## 2019-09-05 NOTE — Assessment & Plan Note (Addendum)
Chronic, improved on zetia, niacin. Off fish oil. Statin intolerant.  The 10-year ASCVD risk score Mikey Bussing DC Brooke Bonito., et al., 2013) is: 19.4%   Values used to calculate the score:     Age: 79 years     Sex: Female     Is Non-Hispanic African American: No     Diabetic: No     Tobacco smoker: No     Systolic Blood Pressure: 536 mmHg     Is BP treated: No     HDL Cholesterol: 52.6 mg/dL     Total Cholesterol: 213 mg/dL

## 2019-09-06 ENCOUNTER — Ambulatory Visit: Payer: Self-pay | Admitting: *Deleted

## 2019-09-10 ENCOUNTER — Other Ambulatory Visit: Payer: Self-pay | Admitting: *Deleted

## 2019-09-10 NOTE — Patient Outreach (Addendum)
[place] [facility]) Care Management  09/10/2019  Kristin Kramer May 10, 1940 [id number]   Subjective: Telephone call to patient's home / mobile number, spoke with patient, and HIPAA verified.  Patient states she is doing okay, had follow up appointment with primary MD on 09/05/2019, appointment went fine, and numbers were good.   States she also had a follow up appointment with surgeon around 08/20/2019, can not remember the exact date, and may have went earlier since she was having problems with increased pain with mobility.  Wellsite geologist did an evaluation, took X-rays, found that patient may have just over did it (overexerted herself) with activities (such as mopping the floor), MD advised her to decrease activities, increase rest periods, continue to ice, continue to elevate, continue to take pain medications as needed,  and pain has resolved.    States she has completed home health physical therapy and will have another follow up visit with surgeon on 09/23/2019.   Patient states she does not have any education material, EMMI follow up, care coordination, care management, disease monitoring, transportation, community resource, or pharmacy needs at this time.  States she is very appreciative of the follow up, is in agreement  to receive 1 additional follow up call to assess for further CM needs after 09/23/2019 surgeon follow up appointment, and is in agreement to receive [place] Management EMMI follow up calls as needed.       Objective: Per KPN (Knowledge Performance Now, point of care tool) and chart review, patient hospitalization 08/07/2019 - 08/08/2019 for Failed right total hip arthroplasty, status post Right hip acetabular revision on 08/07/2019.     Patient also has a history of Closed right hip fracture , Depression, Herpes zoster, pneumonia, UTI, HLD (hyperlipidemia), Hypothyroidism, Knee osteoarthritis, Mesenteric lymphadenitis, Migraines,  Pre-diabetes, Refusal of blood transfusions as  patient is Jehovah's Witness, Seasonal allergies, Tubular adenoma of colon, and Squamous cell skin cancer.    Patient is currently active with Chronic Care Management program ( [employer] ) at primary MD's office.        Assessment: Received [employer] EMMI General Discharge Red Flag Alert follow up referral on 08/12/2019.   Red Flag Alert Trigger, Day #1, patient answered yes to the following question: Unfilled prescriptions?   EMMI follow up completed and will follow up to assess further care management needs.      Plan: RNCM will call patient for telephone outreach attempt, within 21 business days, EMMI follow up, to assess for further CM needs, and proceed with case closure, within 10 business days if no return call, after 4th unsuccessful outreach call.     Nelli Swalley H. Annia Friendly, BSN, [place] Management [facility] Telephonic CM Phone: [phone] Fax: [phone]

## 2019-09-12 DIAGNOSIS — C44722 Squamous cell carcinoma of skin of right lower limb, including hip: Secondary | ICD-10-CM | POA: Diagnosis not present

## 2019-09-13 DIAGNOSIS — Z96641 Presence of right artificial hip joint: Secondary | ICD-10-CM | POA: Diagnosis not present

## 2019-09-13 DIAGNOSIS — Z471 Aftercare following joint replacement surgery: Secondary | ICD-10-CM | POA: Diagnosis not present

## 2019-09-24 ENCOUNTER — Other Ambulatory Visit: Payer: Self-pay

## 2019-09-24 ENCOUNTER — Ambulatory Visit: Payer: Medicare Other

## 2019-09-24 DIAGNOSIS — E038 Other specified hypothyroidism: Secondary | ICD-10-CM

## 2019-09-24 DIAGNOSIS — E785 Hyperlipidemia, unspecified: Secondary | ICD-10-CM

## 2019-09-24 NOTE — Patient Instructions (Signed)
Dear Kristin Kramer,  Below is a summary of the goals we discussed during our follow up appointment on September 24, 2019. Please contact me anytime with questions or concerns.   Visit Information  Goals Addressed            This Visit's Progress   . Pharmacy Care Plan       CARE PLAN ENTRY  Current Barriers:  . Chronic Disease Management support, education, and care coordination needs related to chronic neck and hip pain, osteopenia, elevated TSH  Pharmacist Clinical Goal(s):  Marland Kitchen Over the next 6 months, patient will work with PharmD and primary care provider to address the following goals: o Address patient concern:  Improve fatigue, hair loss, cold intolerance through medication management for elevated TSH - resolved o Osteopenia: Achieve daily intake of calcium (1200 mg) through dietary choices; Examples include 4 servings of yogurt, cheese, or low fat milk daily as well as certain leafy green vegetables, nuts, and fortified cereals. - resolved, started calcium supplement  o Chronic neck pain: Improve pain level and frequency (currently severe and daily pain) by scheduling Tylenol 500 mg, 1 tablet every 4-6 hours (three-four times daily) - resolved through referral from PCP/surgery   Interventions: . Comprehensive medication review performed  Patient Self Care Activities:  For the next 6 months until follow up visit:  . Achieve daily calcium intake of 1200 mg from dietary choices . Continue to walk 2-3 days per week for 45-60 minutes with goal of 150 minutes per week; May reduce intervals to 30 minutes to reduce hip pain . Continue to take medications as prescribed  Goals updated, see past updates    . Pharmacy Care Plan: Hyperlipidemia       CARE PLAN ENTRY  Current Barriers:  . Uncontrolled hyperlipidemia . Current antihyperlipidemic regimen:   Zetia 10 mg - 1 tablet daily (started 08/2018)  Omega 3 fatty acids 1600 mg - 1 capsule daily . Previous antihyperlipidemic  medications tried atorvastatin, pravastatin (muscle cramps) . Most recent lipid panel:     Component Value Date/Time   CHOL 213 (H) 08/29/2019 0810   TRIG 125.0 08/29/2019 0810   TRIG 97 06/24/2011 0000   HDL 52.60 08/29/2019 0810   CHOLHDL 4 08/29/2019 0810   VLDL 25.0 08/29/2019 0810   LDLCALC 135 (H) 08/29/2019 0810   LDLDIRECT 146.4 08/13/2012 0826 .  ASCVD risk enhancing conditions: age >57, LDL > 100 . 10-year ASCVD risk score: 26.1% (this indicates that among 100 patients with the similar risk factors to you, 26 would be expected to have a heart attack or stroke in the next 10 years); This risk may be reduced with a statin medication, achieving 150 minutes of exercise weekly, and a heart healthy diet  Pharmacist Clinical Goal(s):  Marland Kitchen Over the next 6 months, patient will work with PharmD and primary care providers towards the following goals: o Achieving LDL (bad cholesterol) less than 100 mg/dL o Exercising for 150 minutes per week o Adhering to diet emphasizing intake of vegetables, fruits, legumes, nuts, whole grains, and fish and reducing intake of high cholesterol content foods  Interventions: . Comprehensive medication review performed; medication list updated in electronic medical record.  . Discussed cholesterol goals, diet, and exercise  . Discussed benefits of once weekly statin with reduced adverse effects  Patient Self Care Activities:  . Slowly increase exercise with goal of 30 minutes, 5 days per week . Incorporate a healthy diet high in vegetables, fruits and whole  grains with low-fat dairy products, chicken, fish, legumes, non-tropical vegetable oils and nuts. Limit intake of sweets, sugar-sweetened beverages and red meats  Please see past updates related to this goal by clicking on the "Past Updates" button in the selected goal       The patient verbalized understanding of instructions provided today and agreed to receive a mailed copy of patient instruction  and/or educational materials.  Telephone follow up appointment with pharmacy team member scheduled for: 03/26/20 at 10 AM (telephone visit)  [name], PharmD Clinical Pharmacist [facility] [place] [phone]     Why follow it? Research shows. . Those who follow the Mediterranean diet have a reduced risk of heart disease  . The diet is associated with a reduced incidence of Parkinson's and Alzheimer's diseases . People following the diet may have longer life expectancies and lower rates of chronic diseases  . The Dietary Guidelines for Americans recommends the Mediterranean diet as an eating plan to promote health and prevent disease  What Is the Mediterranean Diet?  . Healthy eating plan based on typical foods and recipes of Mediterranean-style cooking . The diet is primarily a plant based diet; these foods should make up a majority of meals   Starches - Plant based foods should make up a majority of meals - They are an important sources of vitamins, minerals, energy, antioxidants, and fiber - Choose whole grains, foods high in fiber and minimally processed items  - Typical grain sources include wheat, oats, barley, corn, brown rice, bulgar, farro, millet, polenta, couscous  - Various types of beans include chickpeas, lentils, fava beans, black beans, white beans   Fruits  Veggies - Large quantities of antioxidant rich fruits & veggies; 6 or more servings  - Vegetables can be eaten raw or lightly drizzled with oil and cooked  - Vegetables common to the traditional Mediterranean Diet include: artichokes, arugula, beets, broccoli, brussel sprouts, cabbage, carrots, celery, collard greens, cucumbers, eggplant, kale, leeks, lemons, lettuce, mushrooms, okra, onions, peas, peppers, potatoes, pumpkin, radishes, rutabaga, shallots, spinach, sweet potatoes, turnips, zucchini - Fruits common to the Mediterranean Diet include: apples, apricots, avocados, cherries,  clementines, dates, figs, grapefruits, grapes, melons, nectarines, oranges, peaches, pears, pomegranates, strawberries, tangerines  Fats - Replace butter and margarine with healthy oils, such as olive oil, canola oil, and tahini  - Limit nuts to no more than a handful a day  - Nuts include walnuts, almonds, pecans, pistachios, pine nuts  - Limit or avoid candied, honey roasted or heavily salted nuts - Olives are central to the [employer] - can be eaten whole or used in a variety of dishes   Meats Protein - Limiting red meat: no more than a few times a month - When eating red meat: choose lean cuts and keep the portion to the size of deck of cards - Eggs: approx. 0 to 4 times a week  - Fish and lean poultry: at least 2 a week  - Healthy protein sources include, chicken, [place], lean beef, lamb - Increase intake of seafood such as tuna, salmon, trout, mackerel, shrimp, scallops - Avoid or limit high fat processed meats such as sausage and bacon  Dairy - Include moderate amounts of low fat dairy products  - Focus on healthy dairy such as fat free yogurt, skim milk, low or reduced fat cheese - Limit dairy products higher in fat such as whole or 2% milk, cheese, ice cream  Alcohol - Moderate amounts of red  wine is ok  - No more than 5 oz daily for women (all ages) and men older than age 52  - No more than 10 oz of wine daily for men younger than 82  Other - Limit sweets and other desserts  - Use herbs and spices instead of salt to flavor foods  - Herbs and spices common to the traditional Mediterranean Diet include: basil, bay leaves, chives, cloves, cumin, fennel, garlic, lavender, marjoram, mint, oregano, parsley, pepper, rosemary, sage, savory, sumac, tarragon, thyme   It's not just a diet, it's a lifestyle:  . The Mediterranean diet includes lifestyle factors typical of those in the region  . Foods, drinks and meals are best eaten with others and savored . Daily physical activity is  important for overall good health . This could be strenuous exercise like running and aerobics . This could also be more leisurely activities such as walking, housework, yard-work, or taking the stairs . Moderation is the key; a balanced and healthy diet accommodates most foods and drinks . Consider portion sizes and frequency of consumption of certain foods   Meal Ideas & Options:  . Breakfast:  o Whole wheat toast or whole wheat English muffins with peanut butter & hard boiled egg o Steel cut oats topped with apples & cinnamon and skim milk  o Fresh fruit: banana, strawberries, melon, berries, peaches  o Smoothies: strawberries, bananas, greek yogurt, peanut butter o Low fat greek yogurt with blueberries and granola  o Egg white omelet with spinach and mushrooms o Breakfast couscous: whole wheat couscous, apricots, skim milk, cranberries  . Sandwiches:  o Hummus and grilled vegetables (peppers, zucchini, squash) on whole wheat bread   o Grilled chicken on whole wheat pita with lettuce, tomatoes, cucumbers or tzatziki  o Tuna salad on whole wheat bread: tuna salad made with greek yogurt, olives, red peppers, capers, green onions o Garlic rosemary lamb pita: lamb sauted with garlic, rosemary, salt & pepper; add lettuce, cucumber, greek yogurt to pita - flavor with lemon juice and black pepper  . Seafood:  o Mediterranean grilled salmon, seasoned with garlic, basil, parsley, lemon juice and black pepper o Shrimp, lemon, and spinach whole-grain pasta salad made with low fat greek yogurt  o Seared scallops with lemon orzo  o Seared tuna steaks seasoned salt, pepper, coriander topped with tomato mixture of olives, tomatoes, olive oil, minced garlic, parsley, green onions and cappers  . Meats:  o Herbed greek chicken salad with kalamata olives, cucumber, feta  o Red bell peppers stuffed with spinach, bulgur, lean ground beef (or lentils) & topped with feta   o Kebabs: skewers of chicken,  tomatoes, onions, zucchini, squash  o [place] burgers: made with red onions, mint, dill, lemon juice, feta cheese topped with roasted red peppers . Vegetarian o Cucumber salad: cucumbers, artichoke hearts, celery, red onion, feta cheese, tossed in olive oil & lemon juice  o Hummus and whole grain pita points with a greek salad (lettuce, tomato, feta, olives, cucumbers, red onion) o Lentil soup with celery, carrots made with vegetable broth, garlic, salt and pepper  o Tabouli salad: parsley, bulgur, mint, scallions, cucumbers, tomato, radishes, lemon juice, olive oil, salt and pepper.

## 2019-09-24 NOTE — Chronic Care Management (AMB) (Signed)
Chronic Care Management Pharmacy  Name: Kristin Kramer  MRN: [id number] DOB: [birth year]  Chief Complaint/ HPI  Kristin Kramer,  79 y.o., female presents for their Follow-Up CCM visit with the clinical pharmacist via telephone.  PCP : [name], MD  Their chronic conditions include: hyperlipidemia, depression, allergies, osteopenia, vitamin D deficiency, memory deficit  Patient concerns: reports improvement on levothyroxine - less hair loss, more energy, cold tolerance; she decreased vitamin B12 to three days a week as instructed per PCP on 09/05/19   Last CCM visit 06/25/19: started levothyroxine 25 mcg daily per PCP consult  Office Visits:  09/05/19: PCP visit - decrease B12 to M,W,F dosing, take omeprazole for chest pain while on daily naprosyn, Drop naprosyn to once daily add tylenol or just change to tylenol. Let me know if ongoing chest discomfort despite this.   08/15/18: [name] - cont current medications, update DEXA  Consult Visit:  08/07/19:Right hip surgery   Allergies  Allergen Reactions  . Blood-Group Specific Substance   . Lipitor [Atorvastatin] Other (See Comments)    Cramps  . Pravastatin Other (See Comments)    Cramps    Medications: Outpatient Encounter Medications as of 09/24/2019  Medication Sig Note  . ALPRAZolam (XANAX) 0.25 MG tablet Take 1 tablet (0.25 mg total) by mouth daily as needed for anxiety or sleep.   . calcium carbonate (OSCAL) 1500 (600 Ca) MG TABS tablet Take 600 mg of elemental calcium by mouth daily with breakfast.   . Cholecalciferol (VITAMIN D-3) 5000 units TABS Take 5,000 Units by mouth daily.    . Coenzyme Q10 (COQ10 PO) Take 1 capsule by mouth daily.    Marland Kitchen donepezil (ARICEPT) 5 MG tablet TAKE 1 TABLET BY MOUTH EVERY DAY   . ezetimibe (ZETIA) 10 MG tablet TAKE 1 TABLET BY MOUTH EVERY DAY   . famotidine (PEPCID) 20 MG tablet Take 1 tablet (20 mg total) by mouth at bedtime. (Patient taking differently: Take 20 mg by mouth  at bedtime. As needed) 08/13/2019: States not taking and MD aware.   Marland Kitchen HYDROcodone-acetaminophen (NORCO/VICODIN) 5-325 MG tablet Take 1-2 tablets by mouth every 6 (six) hours as needed for moderate pain (pain score 4-6).   Marland Kitchen levothyroxine (SYNTHROID) 25 MCG tablet TAKE 1 TABLET BY MOUTH DAILY BEFORE BREAKFAST.   Marland Kitchen loratadine (CLARITIN) 10 MG tablet Take 1 tablet (10 mg total) by mouth daily. 08/13/2019: States not taking and MD aware.   . montelukast (SINGULAIR) 10 MG tablet TAKE 1 TABLET BY MOUTH EVERYDAY AT BEDTIME   . naproxen (NAPROSYN) 250 MG tablet Take by mouth 2 (two) times daily with a meal.   . niacinamide 500 MG tablet Take 1 tablet (500 mg total) by mouth daily. 08/13/2019: States not taking at this time and resume soon.   . ondansetron (ZOFRAN ODT) 8 MG disintegrating tablet Take 1 tablet (8 mg total) by mouth every 8 (eight) hours as needed for nausea or vomiting. 08/13/2019: States not needed.   . Probiotic Product (PROBIOTIC PO) Take 1 capsule by mouth daily.    . sertraline (ZOLOFT) 25 MG tablet ALTERNATE 1 TABLET AND 1.5 TABLETS EVERY OTHER DAY (Patient taking differently: Take 25-37.5 mg by mouth See admin instructions. Take 25 mg by mouth every other day, alternating with 37.5 mg on the alternate days)   . TURMERIC PO Take 900 mg by mouth daily.     No facility-administered encounter medications on file as of 09/24/2019.   Current Diagnosis/Assessment:  SDOH:  Financial Resource Strain: Low Risk   . Difficulty of Paying Living Expenses: Not hard at all   Goals Addressed            This Visit's Progress   . Pharmacy Care Plan       CARE PLAN ENTRY  Current Barriers:  . Chronic Disease Management support, education, and care coordination needs related to chronic neck and hip pain, osteopenia, elevated TSH  Pharmacist Clinical Goal(s):  Marland Kitchen Over the next 6 months, patient will work with PharmD and primary care provider to address the following goals: o Address patient  concern:  Improve fatigue, hair loss, cold intolerance through medication management for elevated TSH - resolved o Osteopenia: Achieve daily intake of calcium (1200 mg) through dietary choices; Examples include 4 servings of yogurt, cheese, or low fat milk daily as well as certain leafy green vegetables, nuts, and fortified cereals. - resolved, started calcium supplement  o Chronic neck pain: Improve pain level and frequency (currently severe and daily pain) by scheduling Tylenol 500 mg, 1 tablet every 4-6 hours (three-four times daily) - resolved through referral from PCP/surgery   Interventions: . Comprehensive medication review performed  Patient Self Care Activities:  For the next 6 months until follow up visit:  . Achieve daily calcium intake of 1200 mg from dietary choices . Continue to walk 2-3 days per week for 45-60 minutes with goal of 150 minutes per week; May reduce intervals to 30 minutes to reduce hip pain . Continue to take medications as prescribed  Goals updated, see past updates    . Pharmacy Care Plan: Hyperlipidemia       CARE PLAN ENTRY  Current Barriers:  . Uncontrolled hyperlipidemia . Current antihyperlipidemic regimen:   Zetia 10 mg - 1 tablet daily (started 08/2018)  Omega 3 fatty acids 1600 mg - 1 capsule daily . Previous antihyperlipidemic medications tried atorvastatin, pravastatin (muscle cramps) . Most recent lipid panel:     Component Value Date/Time   CHOL 213 (H) 08/29/2019 0810   TRIG 125.0 08/29/2019 0810   TRIG 97 06/24/2011 0000   HDL 52.60 08/29/2019 0810   CHOLHDL 4 08/29/2019 0810   VLDL 25.0 08/29/2019 0810   LDLCALC 135 (H) 08/29/2019 0810   LDLDIRECT 146.4 08/13/2012 0826 .  ASCVD risk enhancing conditions: age >59, LDL > 100 . 10-year ASCVD risk score: 26.1% (this indicates that among 100 patients with the similar risk factors to you, 26 would be expected to have a heart attack or stroke in the next 10 years); This risk may be reduced  with a statin medication, achieving 150 minutes of exercise weekly, and a heart healthy diet  Pharmacist Clinical Goal(s):  Marland Kitchen Over the next 6 months, patient will work with PharmD and primary care providers towards the following goals: o Achieving LDL (bad cholesterol) less than 100 mg/dL o Exercising for 150 minutes per week o Adhering to diet emphasizing intake of vegetables, fruits, legumes, nuts, whole grains, and fish and reducing intake of high cholesterol content foods  Interventions: . Comprehensive medication review performed; medication list updated in electronic medical record.  . Discussed cholesterol goals, diet, and exercise  . Discussed benefits of once weekly statin with reduced adverse effects  Patient Self Care Activities:  . Slowly increase exercise with goal of 30 minutes, 5 days per week . Incorporate a healthy diet high in vegetables, fruits and whole grains with low-fat dairy products, chicken, fish, legumes, non-tropical vegetable oils and nuts. Limit intake of  sweets, sugar-sweetened beverages and red meats  Please see past updates related to this goal by clicking on the "Past Updates" button in the selected goal       Hyperlipidemia   Lipid Panel     Component Value Date/Time   CHOL 213 (H) 08/29/2019 0810   TRIG 125.0 08/29/2019 0810   TRIG 97 06/24/2011 0000   HDL 52.60 08/29/2019 0810   CHOLHDL 4 08/29/2019 0810   VLDL 25.0 08/29/2019 0810   LDLCALC 135 (H) 08/29/2019 0810   LDLDIRECT 146.4 08/13/2012 0826    The 10-year ASCVD risk score Mikey Bussing DC Jr., et al., 2013) is: 19.4%   Values used to calculate the score:     Age: 41 years     Sex: Female     Is Non-Hispanic African American: No     Diabetic: No     Tobacco smoker: No     Systolic Blood Pressure: 376 mmHg     Is BP treated: No     HDL Cholesterol: 52.6 mg/dL     Total Cholesterol: 213 mg/dL   LDL goal < 100 Patient has failed these meds in past: Lipitor, pravastatin - cramps Patient  is currently uncontrolled on the following medications:   Zetia 10 mg - 1 tablet daily (started 08/2018)  Omega 3 fatty acids 1600 mg - 1 daily  CoQ10 - 1 daily   Diet: reports diet has been poor since she has been sitting in the house more post-surgery Exercise: unable to exercise past 6 weeks due to surgery   At last CCM visit, 06/25/19, discussed trying a once weekly statin such as Crestor. Pt prefers to focus on diet/exercise at this time due to recent surgery. She has resumed CoQ10 and fish oil. Discussed benefits of each.   Plan: Continue current medications; Resume exercise as tolerated. Recommend once weekly statin to reduce cholesterol.  Osteopenia/Vitamin D Deficiency   DEXA 09/06/18 T-score left femur neck -2.1, no change from 2018 Vitamin D: 08/14/18 51  Patient has failed these meds in past: none  Patient is currently controlled on the following medications:   Vitamin D3 5000 IU - 1 tablet daily  Calcium 600 mg BID (started since last visit 06/25/19)  We discussed: pt reports high calcium diet (milk, dairy), suggested reducing to 1 calcium daily   Plan: Continue current medications  Depression   Patient has failed these meds in past: none  Patient is currently controlled on the following medications:   Sertraline 25 mg - alternate 1 and 1.5 tablets every other day  Mood: Reports mood is stable. It has been difficult being house bound since surgery but feels like she is dealing with it well. Reports she is sleeping better and hopeful to begin exercising again soon.  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 Abdominal symptoms/reflux somewhat improved. Taking omeprazole per PCP daily until she no longer needs naproxen.   Plan: Continue current medications  Vaccines   Reviewed and discussed  patient's vaccination history.    Immunization History  Administered Date(s) Administered  . Influenza Split 12/01/2011  . Influenza, High Dose Seasonal PF 02/10/2014, 11/27/2017, 11/28/2018  . Influenza,inj,Quad PF,6+ Mos 01/14/2013, 12/29/2014, 02/04/2016  . Influenza-Unspecified 01/03/2017  . PFIZER SARS-COV-2 Vaccination 04/15/2019, 05/06/2019  . Pneumococcal Conjugate-13 07/31/2014  . Pneumococcal Polysaccharide-23 12/01/2011  . Tdap 10/22/2014  . Zoster 02/14/2013  . Zoster Recombinat (Shingrix) 11/29/2016, 10/21/2017   Plan: Vaccinations remain up to date.   Medication Management  Misc: metronidazole 0.75% cream, Zofran 8 mg ODT, Niacinamide 500 mg - 1 tablet at bedtime (taking for skin cancer/per dermatology)  OTCs: famotidine 20 mg, probiotic, vitamin B12 1000 mcg M,W,F  Pharmacy/Benefits: [employer]/Mail order  Adherence: no concerns   Affordability: no concerns   CCM Follow Up: 6 months, telephone   [name], PharmD Clinical Pharmacist [facility] at [facility] [phone]

## 2019-09-25 NOTE — Progress Notes (Signed)
I have collaborated with the care management provider regarding care management and care coordination activities outlined in this encounter and have reviewed this encounter including documentation in the note and care plan. I am certifying that I agree with the content of this note and encounter as supervising physician.  

## 2019-09-26 ENCOUNTER — Other Ambulatory Visit: Payer: Self-pay | Admitting: *Deleted

## 2019-09-26 NOTE — Patient Outreach (Signed)
[facility]) Care Management  09/26/2019  Kristin Kramer 02-23-1941 [id number]   Subjective: Telephone call to patient's home / mobile number, spoke with patient, she verified her name, states she is currently on a Zoom call, requested caller hang on for a minute, and then call was  disconnected.  Telephone call to patient's home  / mobile number, no answer, left HIPAA compliant voicemail message, and requested call back.    Objective:Per KPN (Knowledge Performance Now, point of care tool) and chart review,patient hospitalization 08/07/2019 - 08/08/2019 forFailed right total hip arthroplasty, status postRight hip acetabular revisionon 08/07/2019. 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  Patient is currently active with Chronic Care Management program ( [employer] ) at primary MD's office.        Assessment: Received [facility] EMMI General Discharge Red Flag Alert follow up referral on 08/12/2019. Red Flag Alert Trigger, Day #1, patient answered yes to the following question: Unfilled prescriptions?EMMI follow up completed and will follow up to assess further care management needs.      Plan:RNCM will call patient for 2nd  telephone outreach attempt, within4business days, EMMI follow up, to assess for further CM needs, and proceed with case closure, within 10 business days if no return call, after 4th unsuccessful outreach call.     Kristin Kramer H. Kristin Kramer, BSN, [place] [facility] Telephonic CM Phone: [phone] Fax: [phone]

## 2019-10-01 ENCOUNTER — Ambulatory Visit: Payer: Self-pay | Admitting: *Deleted

## 2019-10-03 ENCOUNTER — Ambulatory Visit: Payer: Medicare Other | Admitting: *Deleted

## 2019-10-09 ENCOUNTER — Ambulatory Visit: Payer: Self-pay | Admitting: *Deleted

## 2019-10-15 DIAGNOSIS — Z96641 Presence of right artificial hip joint: Secondary | ICD-10-CM | POA: Diagnosis not present

## 2019-10-15 DIAGNOSIS — Z471 Aftercare following joint replacement surgery: Secondary | ICD-10-CM | POA: Diagnosis not present

## 2019-10-18 ENCOUNTER — Ambulatory Visit: Payer: Self-pay | Admitting: *Deleted

## 2019-10-28 ENCOUNTER — Ambulatory Visit: Payer: Self-pay | Admitting: *Deleted

## 2019-10-31 ENCOUNTER — Encounter: Payer: Self-pay | Admitting: *Deleted

## 2019-10-31 ENCOUNTER — Other Ambulatory Visit: Payer: Self-pay | Admitting: *Deleted

## 2019-10-31 NOTE — Patient Outreach (Signed)
[place] [facility]) Care Management [facility] CM Telephone Outreach, new patient referral to assume care from previous [place] EMMI program case closure  10/31/2019  Kristin Kramer November 05, 1940 [id number]  Successful initial telephone outreach attempt to [name], 79 y/o female referred to this [facility] RN CM 10/24/19 by [facility] leadership team to assume care of patient from previous [place].  Noted from review of EHR that patient was previously enrolled in [facility] program 08/13/19, and had not since been placed in [facility] CM program.  Original referral received from [place] 08/12/19 for EMMI Red-Alert notification for General Discharge after patient had elective surgery for (R) hip arthroplasty revision; patient had surgery August 07, 2019 and was discharged home August 08, 2019 to self care with home health PT services which are now completed.  Patient has history including, but not limited to, arthritis; depression; GERD; and HLD.  HIPAA/ identity verified and purpose of call discussed with patient; she reports she is currently driving home from the beach and states she is doing "the best" she can around managing ongoing post-op pain; has attended all post-surgical provider office visits and has another scheduled office visit with surgeon on 11/15/19.  Continues to drive self, and reports continues independent in self-care for ADL's and iADL's.  Reports she has discussed her ongoing pain with surgeon who has recommended ongoing outpatient PT, which starts on Monday.  Patient reports good rapport with care providers and states she has and will continue to keep them informed around her progress and concerns with ongoing post-op pain.  Patient sounds to be in no distress throughout call today.  [facility] CM services were discussed with patient, who denies care coordination/ disease management/ pharmacy/ and community resource needs.  Patient declines need for ongoing [facility] CM follow up.  Discussed with patient that I  would place letter in mail to her describing [facility] CM services along with my contact information, should needs arise in the future; she is agreeable to this.  Plan:  Will close EMMI program that has been open since 08/13/19 and make patient inactive with [facility] CM services, as she declines ongoing participation in program stating no care coordination/ disease management/ pharmacy/ and community resource needs  Will mail patient letter describing [facility] CM services, should needs arise in the future, as thus far no letter has  been mailed to her  Will make patient's PCP aware of EMMI program case closure today  Oneta Rack, RN, BSN, [place] Coordinator [facility] Care Management  [phone]

## 2019-11-04 DIAGNOSIS — M25551 Pain in right hip: Secondary | ICD-10-CM | POA: Diagnosis not present

## 2019-11-04 DIAGNOSIS — R262 Difficulty in walking, not elsewhere classified: Secondary | ICD-10-CM | POA: Diagnosis not present

## 2019-11-07 DIAGNOSIS — R262 Difficulty in walking, not elsewhere classified: Secondary | ICD-10-CM | POA: Diagnosis not present

## 2019-11-07 DIAGNOSIS — M25551 Pain in right hip: Secondary | ICD-10-CM | POA: Diagnosis not present

## 2019-11-12 DIAGNOSIS — R262 Difficulty in walking, not elsewhere classified: Secondary | ICD-10-CM | POA: Diagnosis not present

## 2019-11-12 DIAGNOSIS — M25551 Pain in right hip: Secondary | ICD-10-CM | POA: Diagnosis not present

## 2019-11-18 DIAGNOSIS — M25551 Pain in right hip: Secondary | ICD-10-CM | POA: Diagnosis not present

## 2019-11-18 DIAGNOSIS — R262 Difficulty in walking, not elsewhere classified: Secondary | ICD-10-CM | POA: Diagnosis not present

## 2019-11-20 ENCOUNTER — Other Ambulatory Visit: Payer: Self-pay | Admitting: Family Medicine

## 2019-11-20 DIAGNOSIS — R262 Difficulty in walking, not elsewhere classified: Secondary | ICD-10-CM | POA: Diagnosis not present

## 2019-11-20 DIAGNOSIS — M25551 Pain in right hip: Secondary | ICD-10-CM | POA: Diagnosis not present

## 2019-11-26 ENCOUNTER — Other Ambulatory Visit: Payer: Self-pay | Admitting: Family Medicine

## 2019-11-26 DIAGNOSIS — M25551 Pain in right hip: Secondary | ICD-10-CM | POA: Diagnosis not present

## 2019-11-26 DIAGNOSIS — R262 Difficulty in walking, not elsewhere classified: Secondary | ICD-10-CM | POA: Diagnosis not present

## 2019-11-28 DIAGNOSIS — M25551 Pain in right hip: Secondary | ICD-10-CM | POA: Diagnosis not present

## 2019-11-28 DIAGNOSIS — R262 Difficulty in walking, not elsewhere classified: Secondary | ICD-10-CM | POA: Diagnosis not present

## 2019-12-02 DIAGNOSIS — R262 Difficulty in walking, not elsewhere classified: Secondary | ICD-10-CM | POA: Diagnosis not present

## 2019-12-02 DIAGNOSIS — M25551 Pain in right hip: Secondary | ICD-10-CM | POA: Diagnosis not present

## 2019-12-06 DIAGNOSIS — R262 Difficulty in walking, not elsewhere classified: Secondary | ICD-10-CM | POA: Diagnosis not present

## 2019-12-06 DIAGNOSIS — M25551 Pain in right hip: Secondary | ICD-10-CM | POA: Diagnosis not present

## 2019-12-10 DIAGNOSIS — M25551 Pain in right hip: Secondary | ICD-10-CM | POA: Diagnosis not present

## 2019-12-10 DIAGNOSIS — R262 Difficulty in walking, not elsewhere classified: Secondary | ICD-10-CM | POA: Diagnosis not present

## 2019-12-12 DIAGNOSIS — Z96641 Presence of right artificial hip joint: Secondary | ICD-10-CM | POA: Diagnosis not present

## 2019-12-12 DIAGNOSIS — Z471 Aftercare following joint replacement surgery: Secondary | ICD-10-CM | POA: Diagnosis not present

## 2019-12-22 IMAGING — CT CT HIP*R* W/O CM
1 series · 16 of 32 positions shown, 20 images · non-contrast
Comparison: Radiographs dated 11/21/2017, 07/08/2017 and 07/07/2017

CLINICAL DATA: Persistent right hip pain since a femur fracture on
07/07/2017

EXAM:
CT OF THE RIGHT HIP WITHOUT CONTRAST
TECHNIQUE: Multidetector CT imaging of the right hip was performed according to
the standard protocol. Multiplanar CT image reconstructions were
also generated.

[Series 3: soft tissue pelvis/hip · axial · 0.39mm/px · z∈[-505,-118]mm · 16 of 144 slices shown, 20 images]
[im 10/144  soft-tissue]
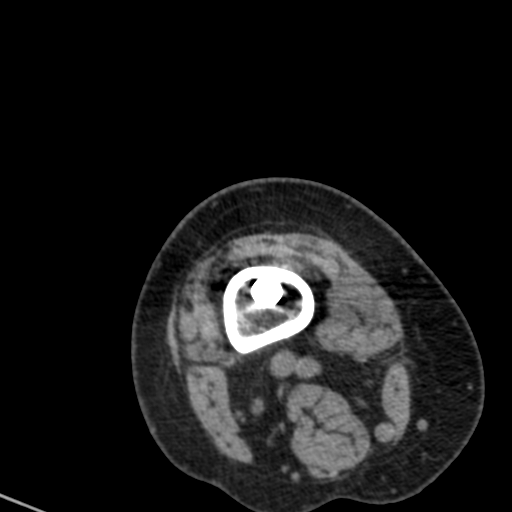
[im 10/144  bone]
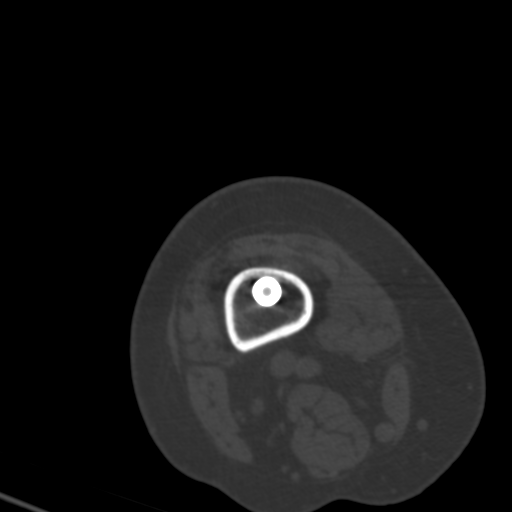
[im 19/144  soft-tissue]
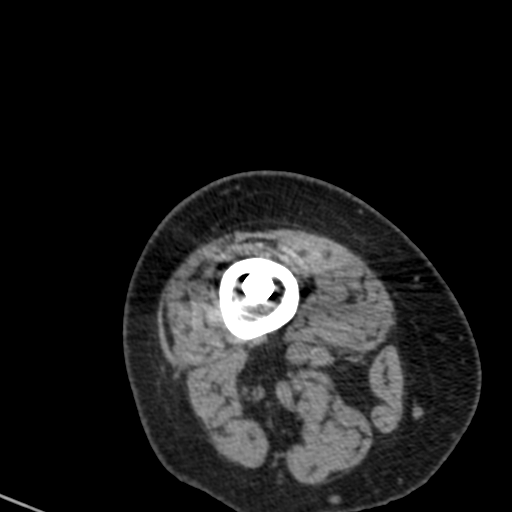
[im 28/144  soft-tissue]
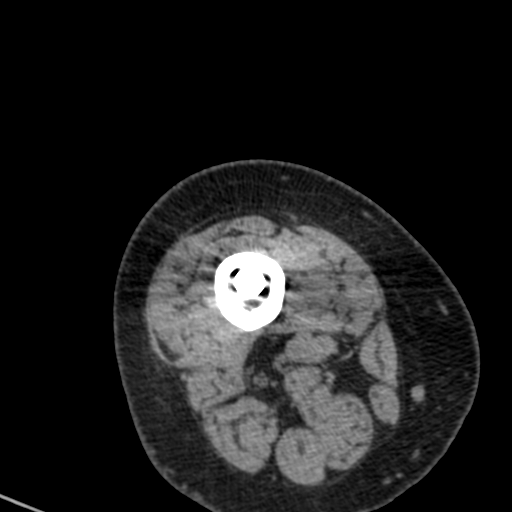
[im 37/144  soft-tissue]
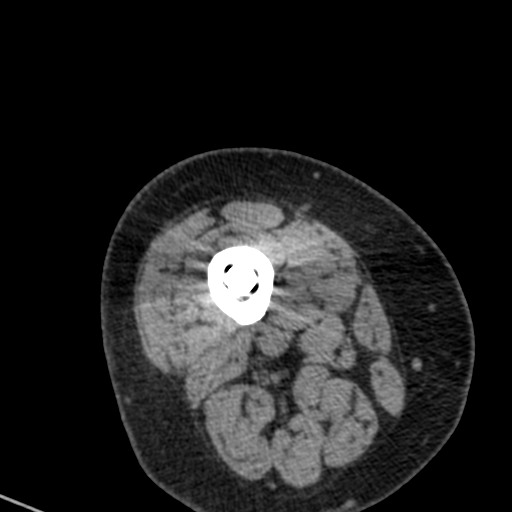
[im 47/144  soft-tissue]
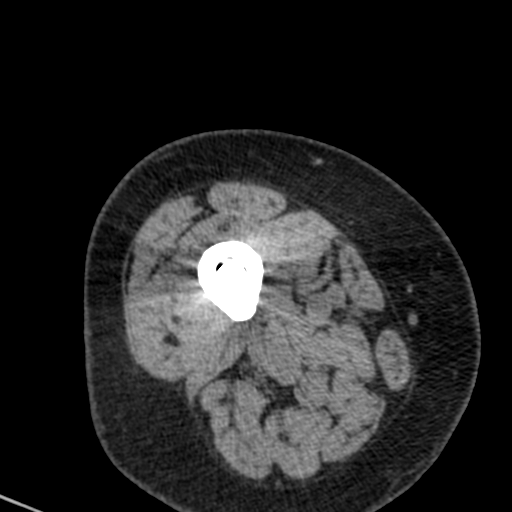
[im 56/144  soft-tissue]
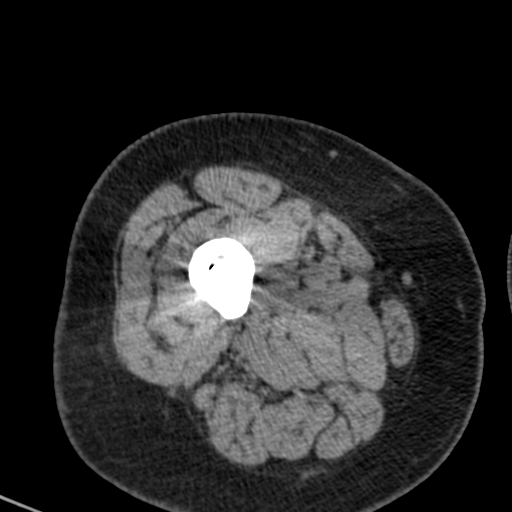
[im 65/144  soft-tissue]
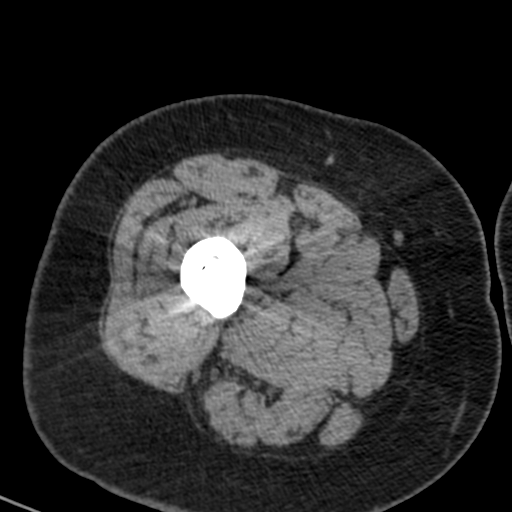
[im 79/144  soft-tissue]
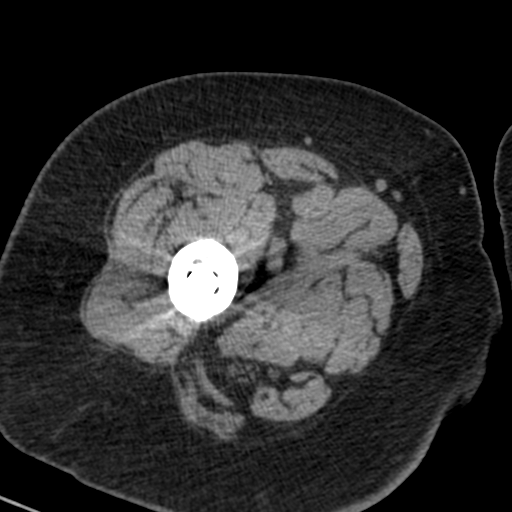
[im 88/144  soft-tissue]
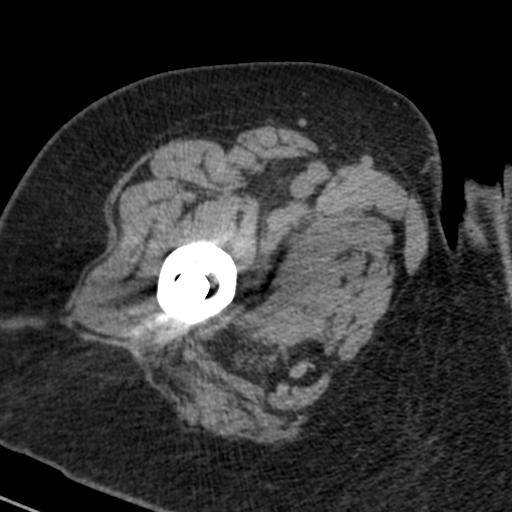
[im 88/144  bone]
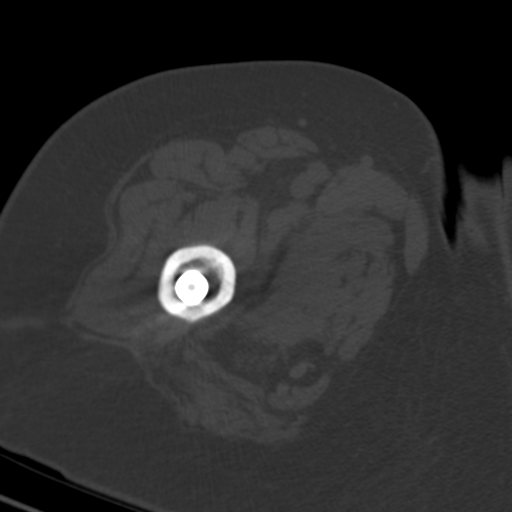
[im 97/144  soft-tissue]
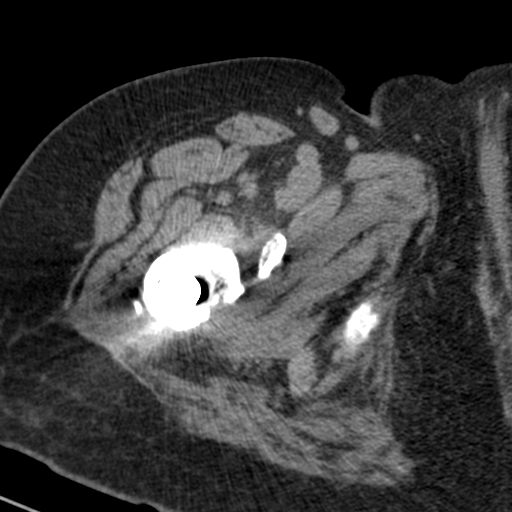
[im 107/144  soft-tissue]
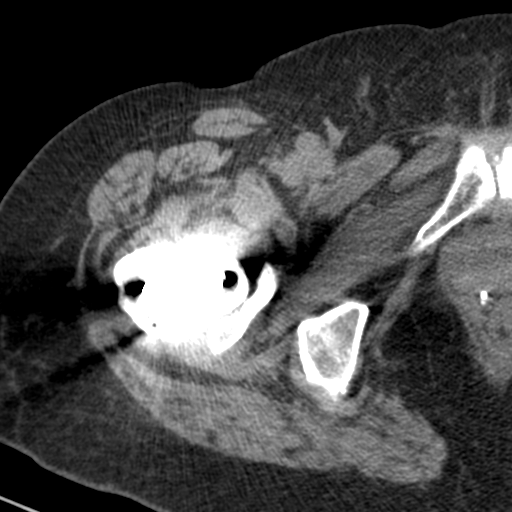
[im 116/144  soft-tissue]
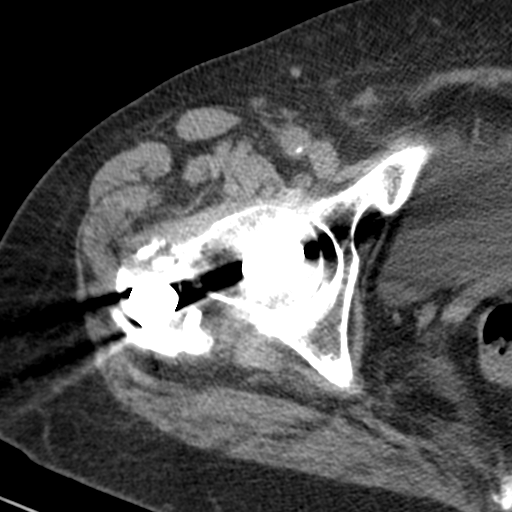
[im 125/144  soft-tissue]
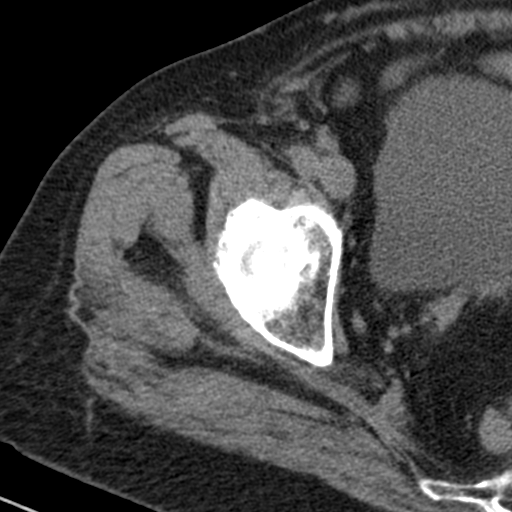
[im 125/144  lung]
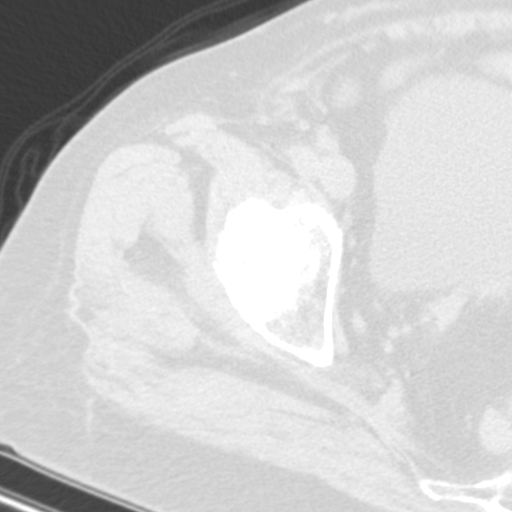
[im 130/144  lung]
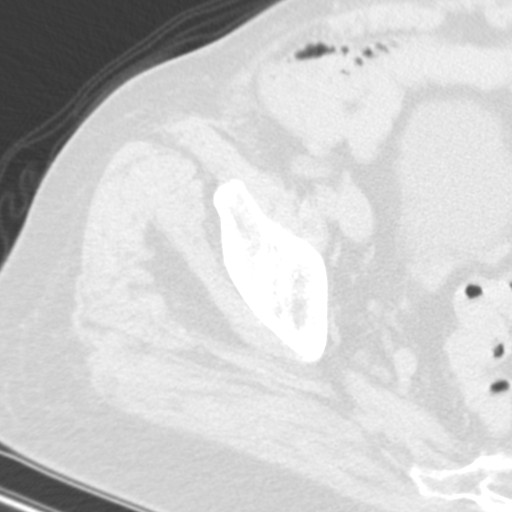
[im 134/144  soft-tissue]
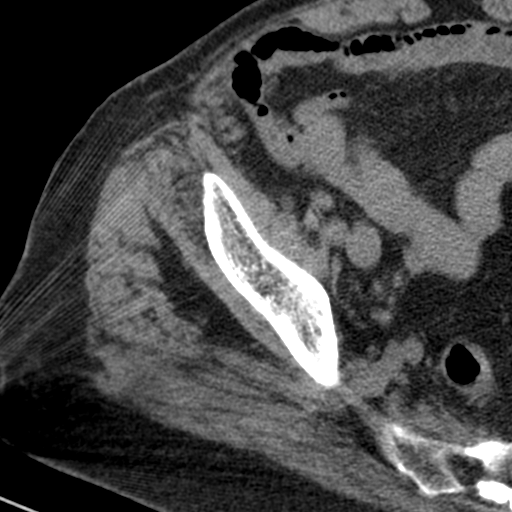
[im 134/144  lung]
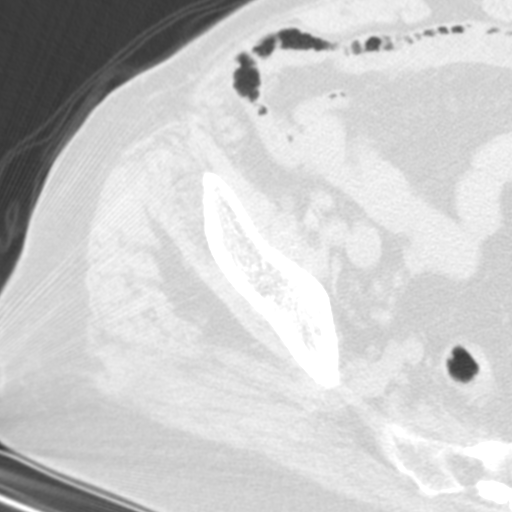
[im 139/144  lung]
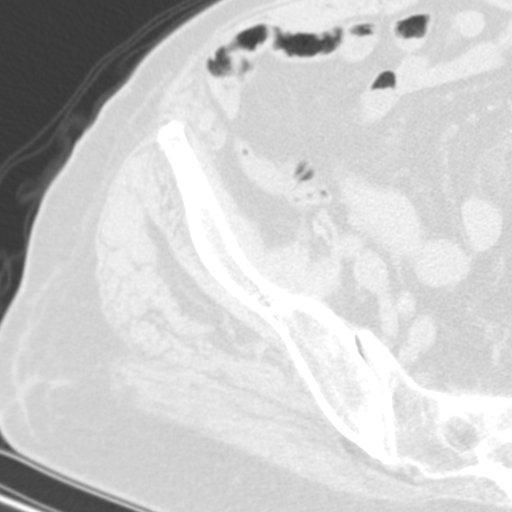

[16 of 32 positions shown; findings below may reference images not displayed]

FINDINGS: Bones/Joint/Cartilage

There is nonunion of the comminuted fracture of the proximal right
femur. Specifically, there is nonunion of the fracture of the tip of
the right greater trochanter, nonunion of the avulsed lesser
trochanter and nonunion of the intertrochanteric fracture.

The hardware appears in excellent position with no evidence of
loosening.

There is no evidence of avascular necrosis of the femoral head.
There are only minimal arthritic changes of the right hip joint.

Muscles and Tendons

Normal.

Soft tissues

Normal.
IMPRESSION: Nonunion of the fractures of proximal right femur as described.

## 2020-01-14 ENCOUNTER — Telehealth: Payer: Self-pay

## 2020-01-14 NOTE — Chronic Care Management (AMB) (Addendum)
Chronic Care Management Pharmacy Assistant   Name: Kristin Kramer  MRN: [id number] DOB: 03/25/1940  Reason for Encounter: Disease State - Lipid Adherence Call  Patient Questions:  1.  Have you seen any other providers since your last visit? Yes. 10/15/2019 Kristin Kramer. [facility] - Orthopaedic Surgery    2.  Any changes in your medicines or health? No  PCP : [name], MD  Allergies:   Allergies  Allergen Reactions   Blood-Group Specific Substance    Lipitor [Atorvastatin] Other (See Comments)    Cramps   Pravastatin Other (See Comments)    Cramps     Medications: Outpatient Encounter Medications as of 01/14/2020  Medication Sig Note   ALPRAZolam (XANAX) 0.25 MG tablet Take 1 tablet (0.25 mg total) by mouth daily as needed for anxiety or sleep.    calcium carbonate (OSCAL) 1500 (600 Ca) MG TABS tablet Take 600 mg of elemental calcium by mouth daily with breakfast.    Cholecalciferol (VITAMIN D-3) 5000 units TABS Take 5,000 Units by mouth daily.     Coenzyme Q10 (COQ10 PO) Take 1 capsule by mouth daily.     donepezil (ARICEPT) 5 MG tablet TAKE 1 TABLET BY MOUTH EVERY DAY    ezetimibe (ZETIA) 10 MG tablet TAKE 1 TABLET BY MOUTH EVERY DAY    famotidine (PEPCID) 20 MG tablet Take 1 tablet (20 mg total) by mouth at bedtime. (Patient taking differently: Take 20 mg by mouth at bedtime. As needed) 08/13/2019: States not taking and MD aware.    HYDROcodone-acetaminophen (NORCO/VICODIN) 5-325 MG tablet Take 1-2 tablets by mouth every 6 (six) hours as needed for moderate pain (pain score 4-6).    levothyroxine (SYNTHROID) 25 MCG tablet TAKE 1 TABLET BY MOUTH EVERY DAY BEFORE BREAKFAST    loratadine (CLARITIN) 10 MG tablet Take 1 tablet (10 mg total) by mouth daily. 08/13/2019: States not taking and MD aware.    montelukast (SINGULAIR) 10 MG tablet TAKE 1 TABLET BY MOUTH EVERYDAY AT BEDTIME    naproxen (NAPROSYN) 250 MG tablet Take by mouth 2 (two) times daily with a meal.     niacinamide 500 MG tablet Take 1 tablet (500 mg total) by mouth daily. 08/13/2019: States not taking at this time and resume soon.    ondansetron (ZOFRAN ODT) 8 MG disintegrating tablet Take 1 tablet (8 mg total) by mouth every 8 (eight) hours as needed for nausea or vomiting. 08/13/2019: States not needed.    Probiotic Product (PROBIOTIC PO) Take 1 capsule by mouth daily.     sertraline (ZOLOFT) 25 MG tablet ALTERNATE 1 TABLET AND 1.5 TABLETS EVERY OTHER DAY (Patient taking differently: Take 25-37.5 mg by mouth See admin instructions. Take 25 mg by mouth every other day, alternating with 37.5 mg on the alternate days)    TURMERIC PO Take 900 mg by mouth daily.     No facility-administered encounter medications on file as of 01/14/2020.    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   Situational stress 07/10/2019   Leg cramps 08/15/2018   Subclinical hypothyroidism 08/15/2018   TMJ tenderness, right 02/08/2018   Lactose intolerance 02/08/2018   Status post total hip replacement, right 12/07/2017   Closed intertrochanteric fracture with nonunion, right 11/28/2017   Chronic nasal congestion 04/19/2017   Memory deficit 08/05/2016   Cervical neck pain with  evidence of disc disease 08/05/2016   Pre-op evaluation 05/06/2016   Advanced care planning/counseling discussion 07/31/2014   Diarrhea 07/31/2014   Right rib fracture 05/21/2014   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   Post-operative nausea and vomiting    Refusal of blood transfusions as patient is Jehovah's Witness    Medicare annual wellness visit, subsequent 01/14/2013   Elevated alkaline phosphatase level 01/03/2013   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   Osteopenia    HLD (hyperlipidemia)    Vitamin D deficiency    Seasonal allergies    Squamous cell  skin cancer    MDD (major depressive disorder), recurrent episode, moderate (HCC)    Migraines    Abnormal EKG     Comprehensive medication review performed; Spoke to patient regarding cholesterol  Lipid Panel    Component Value Date/Time   CHOL 213 (H) 08/29/2019 0810   TRIG 125.0 08/29/2019 0810   TRIG 97 06/24/2011 0000   HDL 52.60 08/29/2019 0810   LDLCALC 135 (H) 08/29/2019 0810   LDLDIRECT 146.4 08/13/2012 0826    10-year ASCVD risk score: The 10-year ASCVD risk score Kristin Kramer DC Brooke Bonito., et al., 2013) is: 19.4%   Values used to calculate the score:     Age: 13 years     Sex: Female     Is Non-Hispanic African American: No     Diabetic: No     Tobacco smoker: No     Systolic Blood Pressure: 211 mmHg     Is BP treated: No     HDL Cholesterol: 52.6 mg/dL     Total Cholesterol: 213 mg/dL  Current antihyperlipidemic regimen:  Zetia 10 mg one a day (started 08/2019)  Previous antihyperlipidemic medications tried: Atorvastatin / Pravastatin   ASCVD risk enhancing conditions: age >62   What recent interventions/DTPs have been made by any provider to improve Cholesterol control since last CPP Visit: Patient states she is taking her medications as directed by provider.  Any recent hospitalizations or ED visits since last visit with CPP? No  What diet changes have been made to improve Cholesterol?  Patient states she is eating healthy.  What exercise is being done to improve Cholesterol?  Patient states she is active.  Adherence Review: Does the patient have >5 day gap between last estimated fill dates? No     Goals Addressed             This Visit's Progress    Pharmacy Care Plan: Hyperlipidemia   On track    CARE PLAN ENTRY  Current Barriers:  Uncontrolled hyperlipidemia Current antihyperlipidemic regimen:  Zetia 10 mg - 1 tablet daily (started 08/2018) Omega 3 fatty acids 1600 mg - 1 capsule daily Previous antihyperlipidemic medications tried atorvastatin,  pravastatin (muscle cramps) Most recent lipid panel:     Component Value Date/Time   CHOL 213 (H) 08/29/2019 0810   TRIG 125.0 08/29/2019 0810   TRIG 97 06/24/2011 0000   HDL 52.60 08/29/2019 0810   CHOLHDL 4 08/29/2019 0810   VLDL 25.0 08/29/2019 0810   LDLCALC 135 (H) 08/29/2019 0810   LDLDIRECT 146.4 08/13/2012 0826  ASCVD risk enhancing conditions: age >67, LDL > 100 10-year ASCVD risk score: 26.1% (this indicates that among 100 patients with the similar risk factors to you, 26 would be expected to have a heart attack or stroke in the next 10 years); This risk may be reduced with a statin medication, achieving 150 minutes of exercise weekly, and a heart healthy diet  Pharmacist Clinical Goal(s):  Over the next 6 months, patient will work with PharmD and primary care providers towards the following goals: Achieving LDL (bad cholesterol) less than 100 mg/dL Exercising for 150 minutes per week Adhering to diet emphasizing intake of vegetables, fruits, legumes, nuts, whole grains, and fish and reducing intake of high cholesterol content foods  Interventions: Comprehensive medication review performed; medication list updated in electronic medical record.  Discussed cholesterol goals, diet, and exercise  Discussed benefits of once weekly statin with reduced adverse effects  Patient Self Care Activities:  Slowly increase exercise with goal of 30 minutes, 5 days per week Incorporate a healthy diet high in vegetables, fruits and whole grains with low-fat dairy products, chicken, fish, legumes, non-tropical vegetable oils and nuts. Limit intake of sweets, sugar-sweetened beverages and red meats  Please see past updates related to this goal by clicking on the "Past Updates" button in the selected goal         Follow-Up:  Pharmacist Review - Patient states she is taking Zetia as directed. No problems with leg cramps. She has been eating healthy and states she has been active.  [name] , CPP Notified.  Judithann Sheen, [place] Pharmacist Assistant [phone]   I have reviewed the care management and care coordination activities outlined in this encounter and I am certifying that I agree with the content of this note. No further action required.  [name], PharmD Clinical Pharmacist [facility] [phone]

## 2020-01-28 ENCOUNTER — Ambulatory Visit: Payer: Medicare Other | Admitting: Family Medicine

## 2020-02-13 DIAGNOSIS — Z96641 Presence of right artificial hip joint: Secondary | ICD-10-CM | POA: Diagnosis not present

## 2020-02-20 DIAGNOSIS — L814 Other melanin hyperpigmentation: Secondary | ICD-10-CM | POA: Diagnosis not present

## 2020-02-20 DIAGNOSIS — I788 Other diseases of capillaries: Secondary | ICD-10-CM | POA: Diagnosis not present

## 2020-02-20 DIAGNOSIS — L570 Actinic keratosis: Secondary | ICD-10-CM | POA: Diagnosis not present

## 2020-02-20 DIAGNOSIS — L821 Other seborrheic keratosis: Secondary | ICD-10-CM | POA: Diagnosis not present

## 2020-02-20 DIAGNOSIS — Z85828 Personal history of other malignant neoplasm of skin: Secondary | ICD-10-CM | POA: Diagnosis not present

## 2020-02-23 ENCOUNTER — Other Ambulatory Visit: Payer: Self-pay | Admitting: Family Medicine

## 2020-02-24 NOTE — Telephone Encounter (Signed)
Pharmacy requests refill on: Sertraline 25 mg   LAST REFILL: 05/24/2019 (Q-113, R-1)  LAST OV: 09/05/2019 NEXT OV: 03/10/2020 PHARMACY: [id number] [place], [place]

## 2020-02-25 NOTE — Telephone Encounter (Signed)
ERx 

## 2020-02-26 DIAGNOSIS — Z20822 Contact with and (suspected) exposure to covid-19: Secondary | ICD-10-CM | POA: Diagnosis not present

## 2020-03-03 DIAGNOSIS — H04123 Dry eye syndrome of bilateral lacrimal glands: Secondary | ICD-10-CM | POA: Diagnosis not present

## 2020-03-03 DIAGNOSIS — H52223 Regular astigmatism, bilateral: Secondary | ICD-10-CM | POA: Diagnosis not present

## 2020-03-03 DIAGNOSIS — Z9841 Cataract extraction status, right eye: Secondary | ICD-10-CM | POA: Diagnosis not present

## 2020-03-03 DIAGNOSIS — Z9842 Cataract extraction status, left eye: Secondary | ICD-10-CM | POA: Diagnosis not present

## 2020-03-10 ENCOUNTER — Ambulatory Visit (INDEPENDENT_AMBULATORY_CARE_PROVIDER_SITE_OTHER): Payer: Medicare Other | Admitting: Family Medicine

## 2020-03-10 ENCOUNTER — Encounter: Payer: Self-pay | Admitting: Family Medicine

## 2020-03-10 VITALS — BP 150/78 | HR 71 | Temp 97.2°F | Ht 64.96 in | Wt 156.2 lb

## 2020-03-10 DIAGNOSIS — J302 Other seasonal allergic rhinitis: Secondary | ICD-10-CM | POA: Diagnosis not present

## 2020-03-10 DIAGNOSIS — J310 Chronic rhinitis: Secondary | ICD-10-CM | POA: Diagnosis not present

## 2020-03-10 DIAGNOSIS — R030 Elevated blood-pressure reading, without diagnosis of hypertension: Secondary | ICD-10-CM | POA: Insufficient documentation

## 2020-03-10 DIAGNOSIS — R0981 Nasal congestion: Secondary | ICD-10-CM

## 2020-03-10 DIAGNOSIS — R748 Abnormal levels of other serum enzymes: Secondary | ICD-10-CM | POA: Diagnosis not present

## 2020-03-10 DIAGNOSIS — M8580 Other specified disorders of bone density and structure, unspecified site: Secondary | ICD-10-CM | POA: Diagnosis not present

## 2020-03-10 DIAGNOSIS — K219 Gastro-esophageal reflux disease without esophagitis: Secondary | ICD-10-CM

## 2020-03-10 HISTORY — DX: Elevated blood-pressure reading, without diagnosis of hypertension: R03.0

## 2020-03-10 HISTORY — DX: Chronic rhinitis: J31.0

## 2020-03-10 MED ORDER — OMEPRAZOLE 40 MG PO CPDR: 40.0000 mg | DELAYED_RELEASE_CAPSULE | Freq: Every day | ORAL | 3 refills | Status: DC

## 2020-03-10 MED ORDER — IPRATROPIUM BROMIDE 0.03 % NA SOLN: 2.0000 | Freq: Two times a day (BID) | NASAL | 3 refills | Status: DC

## 2020-03-10 MED ORDER — FEXOFENADINE HCL 180 MG PO TABS: 180.0000 mg | ORAL_TABLET | Freq: Every day | ORAL | Status: AC

## 2020-03-10 NOTE — Assessment & Plan Note (Signed)
Recurrent despite pepcid in setting of weight gain and holiday diet.  Will Rx omeprazole 40mg  daily x 2-3 wks then PRN heartburn.  Update with effect

## 2020-03-10 NOTE — Assessment & Plan Note (Signed)
H/o this on singulair - unclear how much benefit she receives from this medication so will trial off and monitor effect on nasal congestion.

## 2020-03-10 NOTE — Progress Notes (Signed)
Patient ID: Kristin Kramer, female    DOB: [birth year], 80 y.o.   MRN: [id number]  This visit was conducted in person.  BP (!) 150/78 (BP Location: Left Arm, Patient Position: Sitting)   Pulse 71   Temp (!) 97.2 F (36.2 C) (Temporal)   Ht 5' 4.96" (1.65 m)   Wt 156 lb 3.2 oz (70.9 kg)   SpO2 100%   BMI 26.02 kg/m   BP Readings from Last 3 Encounters:  03/10/20 (!) 150/78  09/05/19 120/70  08/29/19 117/67  BP elevated on retesting  CC: 74mo f/u visit Subjective:   HPI: Kristin Kramer is a 80 y.o. female presenting on 03/10/2020 for Follow-up   BP elevated today, no h/o elevated readings. Unsure if increased sodium in diet recently but does not some weight gain over holiday season.   Chronic rhinorrhea despite daily loratadine. Feels chronic ear and throat congestion. Notes some ongoing dyspnea. Initial chronic congestion, unsure if singulair beneficial. Continues singulair daily. No fevers/chills, purulent discharge. No significant headache. INS haven't helped.   Has seen allergist - no allergies. Has seen ENT - sinus CT returned clear. No h/o sinus infections. Worse this past year.   Osteopenia - hip doing well (s/p surgery) but not regularly walking. Regularly sees ortho.   Notes increasing indigestion and left sided chest discomfort. Has been taking famotidine 20mg  at night without significant benefit. Not currently taking naprosyn.   On niacin for h/o skin cancers.      Relevant past medical, surgical, family and social history reviewed and updated as indicated. Interim medical history since our last visit reviewed. Allergies and medications reviewed and updated. Outpatient Medications Prior to Visit  Medication Sig Dispense Refill  . ALPRAZolam (XANAX) 0.25 MG tablet Take 1 tablet (0.25 mg total) by mouth daily as needed for anxiety or sleep. 20 tablet 1  . calcium carbonate (OSCAL) 1500 (600 Ca) MG TABS tablet Take 600 mg of elemental calcium by mouth daily  with breakfast.    . Cholecalciferol (VITAMIN D-3) 5000 units TABS Take 5,000 Units by mouth daily.     . Coenzyme Q10 (COQ10 PO) Take 1 capsule by mouth daily.     Marland Kitchen donepezil (ARICEPT) 5 MG tablet TAKE 1 TABLET BY MOUTH EVERY DAY 90 tablet 3  . ezetimibe (ZETIA) 10 MG tablet TAKE 1 TABLET BY MOUTH EVERY DAY 90 tablet 3  . famotidine (PEPCID) 20 MG tablet Take 1 tablet (20 mg total) by mouth at bedtime. (Patient taking differently: Take 20 mg by mouth at bedtime. As needed)    . HYDROcodone-acetaminophen (NORCO/VICODIN) 5-325 MG tablet Take 1-2 tablets by mouth every 6 (six) hours as needed for moderate pain (pain score 4-6). 56 tablet 0  . levothyroxine (SYNTHROID) 25 MCG tablet TAKE 1 TABLET BY MOUTH EVERY DAY BEFORE BREAKFAST 90 tablet 3  . montelukast (SINGULAIR) 10 MG tablet TAKE 1 TABLET BY MOUTH EVERYDAY AT BEDTIME 90 tablet 3  . niacinamide 500 MG tablet Take 1 tablet (500 mg total) by mouth daily.    . ondansetron (ZOFRAN ODT) 8 MG disintegrating tablet Take 1 tablet (8 mg total) by mouth every 8 (eight) hours as needed for nausea or vomiting. 30 tablet 1  . Probiotic Product (PROBIOTIC PO) Take 1 capsule by mouth daily.     . sertraline (ZOLOFT) 25 MG tablet ALTERNATE 1 TABLET AND 1.5 TABLETS EVERY OTHER DAY 113 tablet 3  . TURMERIC PO Take 900 mg by mouth daily.     Marland Kitchen  loratadine (CLARITIN) 10 MG tablet Take 1 tablet (10 mg total) by mouth daily.    . naproxen (NAPROSYN) 250 MG tablet Take by mouth 2 (two) times daily with a meal.     No facility-administered medications prior to visit.     Per HPI unless specifically indicated in ROS section below Review of Systems Objective:  BP (!) 150/78 (BP Location: Left Arm, Patient Position: Sitting)   Pulse 71   Temp (!) 97.2 F (36.2 C) (Temporal)   Ht 5' 4.96" (1.65 m)   Wt 156 lb 3.2 oz (70.9 kg)   SpO2 100%   BMI 26.02 kg/m   Wt Readings from Last 3 Encounters:  03/10/20 156 lb 3.2 oz (70.9 kg)  09/05/19 149 lb (67.6 kg)   08/29/19 146 lb (66.2 kg)      Physical Exam Vitals and nursing note reviewed.  Constitutional:      Appearance: Normal appearance. She is not ill-appearing.  HENT:     Head: Normocephalic and atraumatic.     Nose: Rhinorrhea present. No mucosal edema or congestion.     Right Turbinates: Not enlarged or swollen.     Left Turbinates: Not enlarged or swollen.     Comments: Nasal mucosal erythema    Mouth/Throat:     Mouth: Mucous membranes are moist.     Pharynx: Oropharynx is clear. No oropharyngeal exudate or posterior oropharyngeal erythema.  Eyes:     Extraocular Movements: Extraocular movements intact.     Pupils: Pupils are equal, round, and reactive to light.  Neck:     Thyroid: No thyroid mass or thyromegaly.  Cardiovascular:     Rate and Rhythm: Normal rate and regular rhythm.     Pulses: Normal pulses.     Heart sounds: Normal heart sounds. No murmur heard.   Pulmonary:     Effort: Pulmonary effort is normal. No respiratory distress.     Breath sounds: Normal breath sounds. No wheezing, rhonchi or rales.  Musculoskeletal:     Cervical back: Normal range of motion and neck supple.     Right lower leg: No edema.     Left lower leg: No edema.  Lymphadenopathy:     Cervical: No cervical adenopathy.  Skin:    General: Skin is warm and dry.     Findings: No rash.  Neurological:     Mental Status: She is alert.  Psychiatric:        Mood and Affect: Mood normal.        Behavior: Behavior normal.       Assessment & Plan:  This visit occurred during the SARS-CoV-2 public health emergency.  Safety protocols were in place, including screening questions prior to the visit, additional usage of staff PPE, and extensive cleaning of exam room while observing appropriate contact time as indicated for disinfecting solutions.   Problem List Items Addressed This Visit    Seasonal allergies    See below.       Osteopenia    Encouraged renewed efforts at regular walking  routine.       GERD (gastroesophageal reflux disease)    Recurrent despite pepcid in setting of weight gain and holiday diet.  Will Rx omeprazole 40mg  daily x 2-3 wks then PRN heartburn.  Update with effect      Relevant Medications   omeprazole (PRILOSEC) 40 MG capsule   Elevated blood pressure reading without diagnosis of hypertension    She will start monitoring BP more closely at  home over the next 1-2 wks and let me know if persistently elevated to consider medication.       Elevated alkaline phosphatase level    Will need rechecked next labs.       Chronic rhinitis - Primary    This is despite daily claritin and singulair (on these meds for years). rec change antihistamine, add intranasal atrovent. Update with effect.       Chronic nasal congestion    H/o this on singulair - unclear how much benefit she receives from this medication so will trial off and monitor effect on nasal congestion.           Meds ordered this encounter  Medications  . omeprazole (PRILOSEC) 40 MG capsule    Sig: Take 1 capsule (40 mg total) by mouth daily. For 2-3 weeks then as needed for heartburn    Dispense:  30 capsule    Refill:  3  . ipratropium (ATROVENT) 0.03 % nasal spray    Sig: Place 2 sprays into both nostrils every 12 (twelve) hours.    Dispense:  30 mL    Refill:  3  . fexofenadine (ALLEGRA ALLERGY) 180 MG tablet    Sig: Take 1 tablet (180 mg total) by mouth daily.   No orders of the defined types were placed in this encounter.   Patient Instructions  Stop loratadine. Start xyzal or allegra over the counter (different oral antihistamines). Start atrovent nasal spray.  Ok to try off singulair, but watch for worsening nasal congestion.  Restart walking routine.  May take omeprazole 40mg  daily for 2-3 weeks then as needed for heartburn.  Blood pressure is staying too elevated today - may be just white coat but start checking at home several times a week over next 1-2 weeks,  update me with readings.    Follow up plan: Return in about 6 months (around 09/07/2020), or if symptoms worsen or fail to improve, for annual exam, prior fasting for blood work.  [name], MD

## 2020-03-10 NOTE — Patient Instructions (Addendum)
Stop loratadine. Start xyzal or allegra over the counter (different oral antihistamines). Start atrovent nasal spray.  Ok to try off singulair, but watch for worsening nasal congestion.  Restart walking routine.  May take omeprazole 40mg  daily for 2-3 weeks then as needed for heartburn.  Blood pressure is staying too elevated today - may be just white coat but start checking at home several times a week over next 1-2 weeks, update me with readings.

## 2020-03-10 NOTE — Assessment & Plan Note (Signed)
She will start monitoring BP more closely at home over the next 1-2 wks and let me know if persistently elevated to consider medication.

## 2020-03-10 NOTE — Assessment & Plan Note (Signed)
Encouraged renewed efforts at regular walking routine.

## 2020-03-10 NOTE — Assessment & Plan Note (Signed)
See below

## 2020-03-10 NOTE — Assessment & Plan Note (Addendum)
This is despite daily claritin and singulair (on these meds for years). rec change antihistamine, add intranasal atrovent. Update with effect.

## 2020-03-10 NOTE — Assessment & Plan Note (Signed)
Will need rechecked next labs 

## 2020-03-25 ENCOUNTER — Telehealth: Payer: Self-pay

## 2020-03-25 NOTE — Chronic Care Management (AMB) (Signed)
Chronic Care Management Pharmacy Assistant   Name: Keatyn Luck  MRN: [id number] DOB: [birth year]  Reason for Encounter: Reminder call for 03/26/20 CCM appointment  Patient Questions:  1.  Have you seen any other providers since your last visit? Yes 03/10/20- Dr. [name]- PCP 02/13/20- Gaynelle Arabian, MD- Orthopedics   2.  Any changes in your medicines or health? Yes 03/10/20 Dr. [name] started patient on short course of Allegra, Atrovent nasal spray and omeprazole.      PCP : [name], MD  Allergies:   Allergies  Allergen Reactions  . Blood-Group Specific Substance   . Lipitor [Atorvastatin] Other (See Comments)    Cramps  . Pravastatin Other (See Comments)    Cramps     Medications: Outpatient Encounter Medications as of 03/25/2020  Medication Sig Note  . ALPRAZolam (XANAX) 0.25 MG tablet Take 1 tablet (0.25 mg total) by mouth daily as needed for anxiety or sleep.   . calcium carbonate (OSCAL) 1500 (600 Ca) MG TABS tablet Take 600 mg of elemental calcium by mouth daily with breakfast.   . Cholecalciferol (VITAMIN D-3) 5000 units TABS Take 5,000 Units by mouth daily.    . Coenzyme Q10 (COQ10 PO) Take 1 capsule by mouth daily.    Marland Kitchen donepezil (ARICEPT) 5 MG tablet TAKE 1 TABLET BY MOUTH EVERY DAY   . ezetimibe (ZETIA) 10 MG tablet TAKE 1 TABLET BY MOUTH EVERY DAY   . famotidine (PEPCID) 20 MG tablet Take 1 tablet (20 mg total) by mouth at bedtime. (Patient taking differently: Take 20 mg by mouth at bedtime. As needed) 08/13/2019: States not taking and MD aware.   . fexofenadine (ALLEGRA ALLERGY) 180 MG tablet Take 1 tablet (180 mg total) by mouth daily.   Marland Kitchen HYDROcodone-acetaminophen (NORCO/VICODIN) 5-325 MG tablet Take 1-2 tablets by mouth every 6 (six) hours as needed for moderate pain (pain score 4-6).   Marland Kitchen ipratropium (ATROVENT) 0.03 % nasal spray Place 2 sprays into both nostrils every 12 (twelve) hours.   Marland Kitchen levothyroxine (SYNTHROID) 25 MCG tablet TAKE 1  TABLET BY MOUTH EVERY DAY BEFORE BREAKFAST   . montelukast (SINGULAIR) 10 MG tablet TAKE 1 TABLET BY MOUTH EVERYDAY AT BEDTIME   . niacinamide 500 MG tablet Take 1 tablet (500 mg total) by mouth daily. 08/13/2019: States not taking at this time and resume soon.   Marland Kitchen omeprazole (PRILOSEC) 40 MG capsule Take 1 capsule (40 mg total) by mouth daily. For 2-3 weeks then as needed for heartburn   . ondansetron (ZOFRAN ODT) 8 MG disintegrating tablet Take 1 tablet (8 mg total) by mouth every 8 (eight) hours as needed for nausea or vomiting. 08/13/2019: States not needed.   . Probiotic Product (PROBIOTIC PO) Take 1 capsule by mouth daily.    . sertraline (ZOLOFT) 25 MG tablet ALTERNATE 1 TABLET AND 1.5 TABLETS EVERY OTHER DAY   . TURMERIC PO Take 900 mg by mouth daily.     No facility-administered encounter medications on file as of 03/25/2020.    Current Diagnosis: Patient Active Problem List   Diagnosis Date Noted  . Chronic rhinitis 03/10/2020  . Elevated blood pressure reading without diagnosis of hypertension 03/10/2020  . Health maintenance examination 09/05/2019  . GERD (gastroesophageal reflux disease) 09/05/2019  . 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 Situational stress 07/10/2019  . Leg cramps 08/15/2018  . Subclinical hypothyroidism 08/15/2018  . TMJ tenderness, right 02/08/2018  . Lactose intolerance 02/08/2018  . Status post  total hip replacement, right 12/07/2017  . Closed intertrochanteric fracture with nonunion, right 11/28/2017  . Chronic nasal congestion 04/19/2017  . Memory deficit 08/05/2016  . 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 Pre-op evaluation 05/06/2016  . Advanced care planning/counseling discussion 07/31/2014  . Diarrhea 07/31/2014  . Right rib fracture 05/21/2014  . 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  . Post-operative nausea and vomiting   . Refusal of blood transfusions as patient is Jehovah's Witness    . Medicare annual wellness visit, subsequent 01/14/2013  . Elevated alkaline phosphatase level 01/03/2013  . 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 Osteopenia   . HLD (hyperlipidemia)   . Vitamin D deficiency   . Seasonal allergies   . Squamous cell skin cancer   . MDD (major depressive disorder), recurrent episode, moderate ([place])   . Migraines   . Abnormal EKG    Unable to reach patient to review medications, changes in health and anything she would like to discuss at upcoming appointment.  Patient reminded via voicemail to have all medications and supplements available for review with [name], Pharm. D, at their telephone visit on 03/26/20.  Follow-Up:  Pharmacist Review   [name], CPP notified  Margaretmary Dys, [place] [facility]acy Assistant [phone]

## 2020-03-26 ENCOUNTER — Other Ambulatory Visit: Payer: Self-pay

## 2020-03-26 ENCOUNTER — Ambulatory Visit: Payer: Medicare Other

## 2020-03-26 DIAGNOSIS — K219 Gastro-esophageal reflux disease without esophagitis: Secondary | ICD-10-CM

## 2020-03-26 DIAGNOSIS — E785 Hyperlipidemia, unspecified: Secondary | ICD-10-CM

## 2020-03-26 NOTE — Chronic Care Management (AMB) (Signed)
Chronic Care Management Pharmacy  Name: Kristin Kramer  MRN: [id number] DOB: [birth year]  Chief Complaint/ HPI  Kristin Kramer,  80 y.o., female presents for their Follow-Up CCM visit with the clinical pharmacist via telephone.  PCP : [name], MD  Their chronic conditions include: hyperlipidemia, depression, allergies, osteopenia, vitamin D deficiency, memory deficit  Patient concerns: Reports the following med changes from last PCP visit - changed her allergy medication to [place] from [place]. Using Atrovent twice a day. Stopped Montelukast. Nose was running constantly. Reports some minor improvement but wants to give it a little more time.   Office Visits:  03/11/19: PCP visit - Recurrent GERD on pepcid, Will Rx omeprazole 40mg  daily x 2-3 wks then PRN heartburn. BP elevated today. She will start monitoring BP more closely at home over the next 1-2 wks and let me know if persistently elevated to consider medication. Chronic rhinitis rec change antihistamine, add intranasal atrovent. Trial off Singulair. Restart walking.   09/05/19: PCP visit - decrease B12 to M,W,F dosing, take omeprazole for chest pain while on daily naprosyn, Drop naprosyn to once daily add tylenol or just change to tylenol. Let me know if ongoing chest discomfort despite this.   08/15/18: Kristin Kramer - cont current medications, update DEXA  Consult Visit:  08/07/19:Right hip surgery   Allergies  Allergen Reactions  . Blood-Group Specific Substance   . Lipitor [Atorvastatin] Other (See Comments)    Cramps  . Pravastatin Other (See Comments)    Cramps    Medications: Outpatient Encounter Medications as of 03/26/2020  Medication Sig Note  . ALPRAZolam (XANAX) 0.25 MG tablet Take 1 tablet (0.25 mg total) by mouth daily as needed for anxiety or sleep.   . calcium carbonate (OSCAL) 1500 (600 Ca) MG TABS tablet Take 600 mg of elemental calcium by mouth daily with breakfast.   . Cholecalciferol  (VITAMIN D-3) 5000 units TABS Take 5,000 Units by mouth daily.    . Coenzyme Q10 (COQ10 PO) Take 1 capsule by mouth daily.    Marland Kitchen donepezil (ARICEPT) 5 MG tablet TAKE 1 TABLET BY MOUTH EVERY DAY   . ezetimibe (ZETIA) 10 MG tablet TAKE 1 TABLET BY MOUTH EVERY DAY   . famotidine (PEPCID) 20 MG tablet Take 1 tablet (20 mg total) by mouth at bedtime. (Patient taking differently: Take 20 mg by mouth at bedtime. As needed) 08/13/2019: States not taking and MD aware.   . fexofenadine (ALLEGRA ALLERGY) 180 MG tablet Take 1 tablet (180 mg total) by mouth daily.   Marland Kitchen HYDROcodone-acetaminophen (NORCO/VICODIN) 5-325 MG tablet Take 1-2 tablets by mouth every 6 (six) hours as needed for moderate pain (pain score 4-6).   Marland Kitchen ipratropium (ATROVENT) 0.03 % nasal spray Place 2 sprays into both nostrils every 12 (twelve) hours.   Marland Kitchen levothyroxine (SYNTHROID) 25 MCG tablet TAKE 1 TABLET BY MOUTH EVERY DAY BEFORE BREAKFAST   . montelukast (SINGULAIR) 10 MG tablet TAKE 1 TABLET BY MOUTH EVERYDAY AT BEDTIME   . niacinamide 500 MG tablet Take 1 tablet (500 mg total) by mouth daily. 08/13/2019: States not taking at this time and resume soon.   Marland Kitchen omeprazole (PRILOSEC) 40 MG capsule Take 1 capsule (40 mg total) by mouth daily. For 2-3 weeks then as needed for heartburn   . ondansetron (ZOFRAN ODT) 8 MG disintegrating tablet Take 1 tablet (8 mg total) by mouth every 8 (eight) hours as needed for nausea or vomiting. 08/13/2019: States not needed.   . Probiotic Product (PROBIOTIC  PO) Take 1 capsule by mouth daily.    . sertraline (ZOLOFT) 25 MG tablet ALTERNATE 1 TABLET AND 1.5 TABLETS EVERY OTHER DAY   . TURMERIC PO Take 900 mg by mouth daily.     No facility-administered encounter medications on file as of 03/26/2020.   Current Diagnosis/Assessment:  SDOH: Financial Resource Strain: Low Risk   . Difficulty of Paying Living Expenses: Not hard at all   Goals Addressed            This Visit's Progress   . Pharmacy Care Plan        CARE PLAN ENTRY (see longitudinal plan of care for additional care plan information)  Current Barriers:  . Chronic Disease Management support, education, and care coordination needs related to Hyperlipidemia and GERD   Hyperlipidemia Lab Results  Component Value Date/Time   LDLCALC 135 (H) 08/29/2019 08:10 AM   LDLDIRECT 146.4 08/13/2012 08:26 AM   . Pharmacist Clinical Goal(s): o Over the next 6 months, patient will work with PharmD and providers to achieve LDL goal < 100 . Current regimen:   Zetia 10 mg - 1 tablet daily   Omega 3 fatty acids 1600 mg - 1 daily . Interventions: o Recommend regular exercise and heart healthy diet . Patient self care activities - Over the next 6 months, patient will: o Continue regular exercise 5 days per week o Continue current medications    GERD . Pharmacist Clinical Goal(s): o Over the next 3 months, patient will work with PharmD and providers to improve chest pain and acid reflux symptoms  . Current regimen:  o Omeprazole 40 mg - 1 tablet daily before supper . Interventions: o Reviewed symptoms and administration . Patient self care activities - Over the next 3 months, patient will: o Recommend continuing daily therapy for 2-3 more weeks, if chest pain unresolved please call   Please see past updates related to this goal by clicking on the "Past Updates" button in the selected goal         Elevated Blood Pressure   CMP Latest Ref Rng & Units 08/29/2019 08/08/2019 07/29/2019  Glucose 70 - 99 mg/dL 95 158(H) 111(H)  BUN 6 - 23 mg/dL 16 13 15   Creatinine 0.40 - 1.20 mg/dL 0.64 0.44 0.61  Sodium 135 - 145 mEq/L 140 140 146(H)  Potassium 3.5 - 5.1 mEq/L 4.3 3.8 4.5  Chloride 96 - 112 mEq/L 100 105 106  CO2 19 - 32 mEq/L 26 27 29   Calcium 8.4 - 10.5 mg/dL 10.1 8.3(L) 9.7  Total Protein 6.0 - 8.3 g/dL 7.3 - 7.7  Total Bilirubin 0.2 - 1.2 mg/dL 0.6 - 0.4  Alkaline Phos 39 - 117 U/L 255(H) - 110  AST 0 - 37 U/L 28 - 23  ALT 0 - 35  U/L 22 - 24   Office blood pressures are: BP Readings from Last 3 Encounters:  03/10/20 (!) 150/78  09/05/19 120/70  08/29/19 117/67   BP goal < 130/80 mmHg Patient has failed these meds in the past: none reported Patient checks BP at home: twice daily (morning and evening)  Patient home BP readings are ranging:  Starting 03/12/19 - 139/84, 114/74, 112/73, 106/65, 97/64 131/76, 125/80, 97/72, 114/75, 122/68, 120/80, 111/73, 115/79, 120/76 119/69, 123/78, 140/78, 122/73, 134/82, 138/78  Patient is currently controlled on the following medications:   No pharmacotherapy   Update 03/26/20: Pt has been monitoring BP twice daily since last PCP appt due to elevated BP in clinic. She  has tried to cut back on salt and increase water intake. Trying to be more active lately. Discussed BP is at goal she may resume routine monitoring of once weekly.  Plan: Continue control with diet and exercise; May stop daily home BP monitoring. Check BP prior to appointments.   Hyperlipidemia   Lipid Panel     Component Value Date/Time   CHOL 213 (H) 08/29/2019 0810   TRIG 125.0 08/29/2019 0810   TRIG 97 06/24/2011 0000   HDL 52.60 08/29/2019 0810   CHOLHDL 4 08/29/2019 0810   VLDL 25.0 08/29/2019 0810   LDLCALC 135 (H) 08/29/2019 0810   LDLDIRECT 146.4 08/13/2012 0826    The 10-year ASCVD risk score Mikey Bussing DC Jr., et al., 2013) is: 31%   Values used to calculate the score:     Age: 76 years     Sex: Female     Is Non-Hispanic African American: No     Diabetic: No     Tobacco smoker: No     Systolic Blood Pressure: 458 mmHg     Is BP treated: No     HDL Cholesterol: 52.6 mg/dL     Total Cholesterol: 213 mg/dL   LDL goal < 100 Patient has failed these meds in past: Lipitor, pravastatin - cramps Patient is currently uncontrolled on the following medications:   Zetia 10 mg - 1 tablet daily (started 08/2018)  Omega 3 fatty acids 1600 mg - 1 daily  CoQ10 - 1 daily   Diet: reports diet has  been poor since she has been sitting in the house more post-surgery Exercise: unable to exercise past 6 weeks due to surgery   At [facility] visit, 06/25/19, discussed trying a once weekly statin such as Crestor. Pt prefers to focus on diet/exercise at this time due to recent surgery. She has resumed CoQ10 and fish oil. Discussed benefits of each.  Update 03/26/20: Pt is working on exercise and dietary habits   Plan: Continue current medications  Osteopenia/Vitamin D Deficiency   DEXA 09/06/18 T-score left femur neck -2.1, no change from 2018 Vitamin D: 08/14/18 51  Patient has failed these meds in past: none  Patient is currently controlled on the following medications:   Vitamin D3 5000 IU - 1 tablet daily  Calcium 600 mg BID   No update/changes 03/26/20  Plan: Continue current medications   Memory   Patient has failed these meds in past:  Patient is currently uncontrolled on the following medications:   Donepezil 5 mg - 1 tablet daily  Update 03/26/20: Reports worsening, has a hard time remembering details such as from her books shes reading, within minutes cannot recall what she read. She is in a book club. Reports her mother had dementia. Report she has been on current dose of donepezil for a couple of years. She still pays bills without difficulty. Declines med changes today. Would like to wait until June to discuss with PCP at that point.   Plan: Continue current medications; Monitor worsening of memory and please call if concerns.   Depression   Patient has failed these meds in past: none  Patient is currently controlled on the following medications:   Sertraline 25 mg - alternate 1 and 1.5 tablets every other day  No update/changes 03/26/20  Plan: Continue current medications   GERD   Patient has failed these meds in past:  Patient is currently controlled on the following medications:   Omeprazole 40 mg - 1 tablet daily  Update 03/26/20: Started omeprazole 40 mg 03/10/20  for GERD, working some but still having some chest pain. Reports she tried to skip dose last night due to directions to take for 3 weeks then PRN and had some discomfort.  Reports symptoms are worse at bedtime. She is taking right before bedtime. Trying to eat supper earlier around 6:30-7 PM the past few weeks. Goes to bed around 10 PM. Discussed she may continue for another couple of weeks daily if needed then try as needed. May also try taking it 30 minutes before supper. Discussed weight gain over holidays and impact on GERD.  Plan: Continue current medication daily. Please call if chest pain unresolved or worsening.  Chronic Hip/Neck Pain/DDD   S/p right hip replacement 10/2018 and Revision surgery 08/2019 Sees chiropractor for neck pain twice monthly  Symptoms: reports hip and neck pain are much improved since surgery   Patient has tried these meds in past: Voltaren gel (worked well) Patient is currently controlled on the following medications:   Tylenol/Aleve combination product - 1 tablet BID PRN  We discussed: Stopped additional naproxen after last visit with PCP and continues Tylenol/naproxen combination - 1 in AM and another one around lunch (max BID). Abdominal symptoms/reflux somewhat improved. Taking omeprazole per PCP daily until she no longer needs naproxen.   No update/changes 03/26/20  Plan: Continue current medications  Vaccines   Reviewed and discussed patient's vaccination history.    Immunization History  Administered Date(s) Administered  . Influenza Split 12/01/2011  . Influenza, High Dose Seasonal PF 02/10/2014, 11/27/2017, 11/28/2018, 11/26/2019  . Influenza,inj,Quad PF,6+ Mos 01/14/2013, 12/29/2014, 02/04/2016  . Influenza-Unspecified 01/03/2017  . PFIZER(Purple Top)SARS-COV-2 Vaccination 04/15/2019, 05/06/2019  . Pneumococcal Conjugate-13 07/31/2014  . Pneumococcal Polysaccharide-23 12/01/2011  . Tdap 10/22/2014  . Zoster 02/14/2013  . Zoster Recombinat  (Shingrix) 11/29/2016, 10/21/2017   Plan: Vaccinations remain up to date.   Medication Management  Misc: metronidazole 0.75% cream, Zofran 8 mg ODT, Niacinamide 500 mg - 1 tablet at bedtime (taking for skin cancer/per dermatology)  OTCs: famotidine 20 mg, probiotic, vitamin B12 1000 mcg M,W,F  Pharmacy/Benefits: UHC/Mail order  Adherence: no concerns   Affordability: no concerns   CCM Follow Up: 6 months, telephone   [name], PharmD Clinical Pharmacist [facility] at [facility] [phone]

## 2020-04-02 NOTE — Patient Instructions (Addendum)
Dear Kristin Kramer,  Below is a summary of the goals we discussed during our follow up appointment on April 02, 2020. Please contact me anytime with questions or concerns.   Visit Information  Goals Addressed            This Visit's Progress   . Pharmacy Care Plan       CARE PLAN ENTRY (see longitudinal plan of care for additional care plan information)  Current Barriers:  . Chronic Disease Management support, education, and care coordination needs related to Hyperlipidemia and GERD   Hyperlipidemia Lab Results  Component Value Date/Time   LDLCALC 135 (H) 08/29/2019 08:10 AM   LDLDIRECT 146.4 08/13/2012 08:26 AM   . Pharmacist Clinical Goal(s): o Over the next 6 months, patient will work with PharmD and providers to achieve LDL goal < 100 . Current regimen:   Zetia 10 mg - 1 tablet daily   Omega 3 fatty acids 1600 mg - 1 daily . Interventions: o Recommend regular exercise and heart healthy diet . Patient self care activities - Over the next 6 months, patient will: o Continue regular exercise 5 days per week o Continue current medications    GERD . Pharmacist Clinical Goal(s): o Over the next 3 months, patient will work with PharmD and providers to improve chest pain and acid reflux symptoms  . Current regimen:  o Omeprazole 40 mg - 1 tablet daily before supper . Interventions: o Reviewed symptoms and administration . Patient self care activities - Over the next 3 months, patient will: o Recommend continuing daily therapy for 2-3 more weeks, if chest pain unresolved please call   Please see past updates related to this goal by clicking on the "Past Updates" button in the selected goal         Patient verbalizes understanding of instructions provided today and agrees to view in [place].   Telephone follow up appointment with pharmacy team member scheduled for: 6 months telephone visit   [name], PharmD Clinical Pharmacist [facility]  at [place] [facility] After Age 65 After age 15, you are at a higher risk for certain long-term diseases and infections as well as injuries from falls. Falls are a major cause of broken bones and head injuries in people who are older than age 50. Getting regular preventive care can help to keep you healthy and well. Preventive care includes getting regular testing and making lifestyle changes as recommended by your health care provider. Talk with your health care provider about:  Which screenings and tests you should have. A screening is a test that checks for a disease when you have no symptoms.  A diet and exercise plan that is right for you. What should I know about screenings and tests to prevent falls? Screening and testing are the best ways to find a health problem early. Early diagnosis and treatment give you the best chance of managing medical conditions that are common after age 26. Certain conditions and lifestyle choices may make you more likely to have a fall. Your health care provider may recommend:  Regular vision checks. Poor vision and conditions such as cataracts can make you more likely to have a fall. If you wear glasses, make sure to get your prescription updated if your vision changes.  Medicine review. Work with your health care provider to regularly review all of the medicines you are taking, including over-the-counter medicines. Ask your health care provider about any side  effects that may make you more likely to have a fall. Tell your health care provider if any medicines that you take make you feel dizzy or sleepy.  Osteoporosis screening. Osteoporosis is a condition that causes the bones to get weaker. This can make the bones weak and cause them to break more easily.  Blood pressure screening. Blood pressure changes and medicines to control blood pressure can make you feel dizzy.  Strength and balance checks. Your health care provider may  recommend certain tests to check your strength and balance while standing, walking, or changing positions.  Foot health exam. Foot pain and numbness, as well as not wearing proper footwear, can make you more likely to have a fall.  Depression screening. You may be more likely to have a fall if you have a fear of falling, feel emotionally low, or feel unable to do activities that you used to do.  Alcohol use screening. Using too much alcohol can affect your balance and may make you more likely to have a fall. What actions can I take to lower my risk of falls? General instructions  Talk with your health care provider about your risks for falling. Tell your health care provider if: ? You fall. Be sure to tell your health care provider about all falls, even ones that seem minor. ? You feel dizzy, sleepy, or off-balance.  Take over-the-counter and prescription medicines only as told by your health care provider. These include any supplements.  Eat a healthy diet and maintain a healthy weight. A healthy diet includes low-fat dairy products, low-fat (lean) meats, and fiber from whole grains, beans, and lots of fruits and vegetables. Home safety  Remove any tripping hazards, such as rugs, cords, and clutter.  Install safety equipment such as grab bars in bathrooms and safety rails on stairs.  Keep rooms and walkways well-lit. Activity  Follow a regular exercise program to stay fit. This will help you maintain your balance. Ask your health care provider what types of exercise are appropriate for you.  If you need a cane or walker, use it as recommended by your health care provider.  Wear supportive shoes that have nonskid soles.   Lifestyle  Do not drink alcohol if your health care provider tells you not to drink.  If you drink alcohol, limit how much you have: ? 0-1 drink a day for women. ? 0-2 drinks a day for men.  Be aware of how much alcohol is in your drink. In the [place] one drink  equals one typical bottle of beer (12 oz), one-half glass of wine (5 oz), or one shot of hard liquor (1 oz).  Do not use any products that contain nicotine or tobacco, such as cigarettes and e-cigarettes. If you need help quitting, ask your health care provider. Summary  Having a healthy lifestyle and getting preventive care can help to protect your health and wellness after age 41.  Screening and testing are the best way to find a health problem early and help you avoid having a fall. Early diagnosis and treatment give you the best chance for managing medical conditions that are more common for people who are older than age 14.  Falls are a major cause of broken bones and head injuries in people who are older than age 51. Take precautions to prevent a fall at home.  Work with your health care provider to learn what changes you can make to improve your health and wellness and to prevent falls.  This information is not intended to replace advice given to you by your health care provider. Make sure you discuss any questions you have with your health care provider. Document Revised: 06/14/2018 Document Reviewed: 01/04/2017 Elsevier Patient Education  2021 [employer].

## 2020-04-03 NOTE — Progress Notes (Signed)
I have collaborated with the care management provider regarding care management and care coordination activities outlined in this encounter and have reviewed this encounter including documentation in the note and care plan. I am certifying that I agree with the content of this note and encounter as supervising physician.  

## 2020-05-11 ENCOUNTER — Encounter: Payer: Self-pay | Admitting: Family Medicine

## 2020-05-11 ENCOUNTER — Ambulatory Visit (INDEPENDENT_AMBULATORY_CARE_PROVIDER_SITE_OTHER): Payer: Medicare Other | Admitting: Family Medicine

## 2020-05-11 ENCOUNTER — Other Ambulatory Visit: Payer: Self-pay

## 2020-05-11 VITALS — BP 124/70 | HR 83 | Temp 98.0°F | Ht 64.96 in | Wt 153.6 lb

## 2020-05-11 DIAGNOSIS — R252 Cramp and spasm: Secondary | ICD-10-CM | POA: Diagnosis not present

## 2020-05-11 DIAGNOSIS — I83813 Varicose veins of bilateral lower extremities with pain: Secondary | ICD-10-CM | POA: Insufficient documentation

## 2020-05-11 DIAGNOSIS — R0600 Dyspnea, unspecified: Secondary | ICD-10-CM

## 2020-05-11 DIAGNOSIS — R9431 Abnormal electrocardiogram [ECG] [EKG]: Secondary | ICD-10-CM

## 2020-05-11 DIAGNOSIS — R0609 Other forms of dyspnea: Secondary | ICD-10-CM

## 2020-05-11 HISTORY — DX: Varicose veins of bilateral lower extremities with pain: I83.813

## 2020-05-11 MED ORDER — POTASSIUM 99 MG PO TABS: 1.0000 | ORAL_TABLET | Freq: Every day | ORAL | Status: AC

## 2020-05-11 NOTE — Progress Notes (Unsigned)
Patient ID: Kristin Kramer, female    DOB: [birth year], 80 y.o.   MRN: [id number]  This visit was conducted in person.  BP 124/70   Pulse 83   Temp 98 F (36.7 C) (Temporal)   Ht 5' 4.96" (1.65 m)   Wt 153 lb 9 oz (69.7 kg)   SpO2 93%   BMI 25.59 kg/m   BP Readings from Last 3 Encounters:  05/11/20 124/70  03/10/20 (!) 150/78  09/05/19 120/70    CC: leg cramping and vein swelling  Subjective:   HPI: Kristin Kramer is a 80 y.o. female presenting on 05/11/2020 for Leg Pain (C/o a lot of leg cramps at night and has noticed varicose veins.  )   Home BP readings staying overall well controlled.   Has noted increased varicose veins to R>L leg - R medial knee varicose vein is very itchy. Ongoing issue for 1 year, but progressively worsening. No leg soreness. Notes mild ankle swelling and indentation when wearing socks.  S/p R hip replacement with redo x2, latest 08/2019.  S/p R (9 yrs ago) and L (8 yrs ago) knee surgery.  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   Notes 1 yr of increasing leg cramps, worse at night time.  She tries to elevate legs, avoids salt/sodium in diet. Could do better with water intake. She hasn't tried compression stockings recently.   Denies new medications, vitamins or supplements. She did start new Myro eye drops for retina recently.   By the way - notes worsening easy exertional dyspnea for months, notes dypsnea when she goes up 1 flight of stairs.   Due for colon cancer screening - this was postponed from 08/2019 due to hospitalization and hip replacement surgery recovery. She is considering options.      Relevant past medical, surgical, family and social history reviewed and updated as indicated. Interim medical history since our last visit reviewed. Allergies and medications reviewed and updated. Outpatient Medications  Prior to Visit  Medication Sig Dispense Refill  . aspirin EC 81 MG tablet Take 1 tablet (81 mg total) by mouth daily. Swallow whole.    . calcium carbonate (OSCAL) 1500 (600 Ca) MG TABS tablet Take 600 mg of elemental calcium by mouth daily with breakfast.    . Cholecalciferol (VITAMIN D-3) 5000 units TABS Take 5,000 Units by mouth daily.     . Coenzyme Q10 (COQ10 PO) Take 1 capsule by mouth daily.     Marland Kitchen donepezil (ARICEPT) 5 MG tablet TAKE 1 TABLET BY MOUTH EVERY DAY 90 tablet 3  . ezetimibe (ZETIA) 10 MG tablet TAKE 1 TABLET BY MOUTH EVERY DAY 90 tablet 3  . famotidine (PEPCID) 20 MG tablet Take 1 tablet (20 mg total) by mouth at bedtime. (Patient taking differently: Take 20 mg by mouth at bedtime. As needed)    . fexofenadine (ALLEGRA ALLERGY) 180 MG tablet Take 1 tablet (180 mg total) by mouth daily.    Marland Kitchen ipratropium (ATROVENT) 0.03 % nasal spray Place 2 sprays into both nostrils every 12 (twelve) hours. 30 mL 3  . levothyroxine (SYNTHROID) 25 MCG tablet TAKE 1 TABLET BY MOUTH EVERY DAY BEFORE BREAKFAST 90 tablet 3  . niacinamide 500 MG tablet Take 1 tablet (500 mg total) by mouth daily.    Marland Kitchen omeprazole (PRILOSEC) 40 MG capsule Take 1 capsule (40  mg total) by mouth daily. For 2-3 weeks then as needed for heartburn 30 capsule 3  . ondansetron (ZOFRAN ODT) 8 MG disintegrating tablet Take 1 tablet (8 mg total) by mouth every 8 (eight) hours as needed for nausea or vomiting. 30 tablet 1  . Probiotic Product (PROBIOTIC PO) Take 1 capsule by mouth daily.     . sertraline (ZOLOFT) 25 MG tablet ALTERNATE 1 TABLET AND 1.5 TABLETS EVERY OTHER DAY 113 tablet 3  . TURMERIC PO Take 900 mg by mouth daily.     Marland Kitchen ALPRAZolam (XANAX) 0.25 MG tablet Take 1 tablet (0.25 mg total) by mouth daily as needed for anxiety or sleep. 20 tablet 1  . HYDROcodone-acetaminophen (NORCO/VICODIN) 5-325 MG tablet Take 1-2 tablets by mouth every 6 (six) hours as needed for moderate pain (pain score 4-6). 56 tablet 0  .  montelukast (SINGULAIR) 10 MG tablet TAKE 1 TABLET BY MOUTH EVERYDAY AT BEDTIME 90 tablet 3   No facility-administered medications prior to visit.     Per HPI unless specifically indicated in ROS section below Review of Systems Objective:  BP 124/70   Pulse 83   Temp 98 F (36.7 C) (Temporal)   Ht 5' 4.96" (1.65 m)   Wt 153 lb 9 oz (69.7 kg)   SpO2 93%   BMI 25.59 kg/m   Wt Readings from Last 3 Encounters:  05/11/20 153 lb 9 oz (69.7 kg)  03/10/20 156 lb 3.2 oz (70.9 kg)  09/05/19 149 lb (67.6 kg)      Physical Exam Vitals and nursing note reviewed.  Constitutional:      Appearance: Normal appearance. She is not ill-appearing.  Cardiovascular:     Rate and Rhythm: Normal rate and regular rhythm.     Pulses: Normal pulses.     Heart sounds: Normal heart sounds. No murmur heard.   Pulmonary:     Effort: Pulmonary effort is normal. No respiratory distress.     Breath sounds: Normal breath sounds. No wheezing, rhonchi or rales.  Musculoskeletal:        General: Swelling and tenderness (RLE) present.     Right lower leg: Edema (tr) present.     Left lower leg: Edema (tr) present.     Comments:  1+ DP bilaterally Varicose veins R>L lower legs into medial ankles Reticular vein to R>L feet RLE tender to palpation medial leg throughout   Skin:    General: Skin is warm and dry.     Findings: No erythema or rash.  Neurological:     Mental Status: She is alert.  Psychiatric:        Mood and Affect: Mood normal.        Behavior: Behavior normal.       Results for orders placed or performed in visit on 45/40/98  Basic metabolic panel  Result Value Ref Range   Sodium 141 135 - 145 mEq/L   Potassium 4.3 3.5 - 5.1 mEq/L   Chloride 102 96 - 112 mEq/L   CO2 30 19 - 32 mEq/L   Glucose, Bld 108 (H) 70 - 99 mg/dL   BUN 17 6 - 23 mg/dL   Creatinine, Ser 0.76 0.40 - 1.20 mg/dL   GFR 74.62 >60.00 mL/min   Calcium 9.6 8.4 - 10.5 mg/dL  CK  Result Value Ref Range   Total CK  101 7 - 177 U/L  Magnesium  Result Value Ref Range   Magnesium 2.0 1.5 - 2.5 mg/dL   EKG -  NSR rate 75, LAD with LAFB, normal intervals, no hypertrophy or acute ST/T changes, poor R wave progression.  Assessment & Plan:  This visit occurred during the SARS-CoV-2 public health emergency.  Safety protocols were in place, including screening questions prior to the visit, additional usage of staff PPE, and extensive cleaning of exam room while observing appropriate contact time as indicated for disinfecting solutions.   Problem List Items Addressed This Visit    Abnormal EKG    Update EKG today.       Exertional dyspnea    Endorses several months of progressive exertional dyspnea associated with substernal chest discomfort.  Continue zetia (intolerance to tried statins).  Update EKG today.  Refer to cardiology for further evaluation.  rec start aspirin daily until seen by cardiology.       Relevant Orders   EKG 12-Lead (Completed)   Ambulatory referral to Cardiology   Leg cramping    Ongoing over the past year.  Update Mg with other electrolytes and CPK in zetia use.       Relevant Orders   Basic metabolic panel (Completed)   CK (Completed)   Magnesium (Completed)   Varicose veins of both lower extremities with pain - Primary    R>L varicose veins with tenderness to palpation of lower leg. Reviewed conservative management to include leg elevation, avoiding salt/sodium, drinking water.  She has compression stockings she will start using regularly.  Discussed VVS evaluation if no benefit with above.       Relevant Medications   aspirin EC 81 MG tablet       Meds ordered this encounter  Medications  . Potassium 99 MG TABS    Sig: Take 1 tablet (99 mg total) by mouth daily.   Orders Placed This Encounter  Procedures  . Basic metabolic panel  . CK  . Magnesium  . Ambulatory referral to Cardiology    Referral Priority:   Routine    Referral Type:   Consultation     Referral Reason:   Specialty Services Required    Requested Specialty:   Cardiology    Number of Visits Requested:   1  . EKG 12-Lead    Patient Instructions  Start using compression stockings regularly, take off at night time. Continue leg elevation, avoid salt/sodium in the diet. Let me know if interested in referral to vascular surgeon (if legs worsening).  Continue Coenzyme Q10 100mg  daily.  For worsening shortness of breath with exertion - we will refer you back to cardiology for further evaluation.  Update EKG today.    Follow up plan: Return if symptoms worsen or fail to improve.  [name], MD

## 2020-05-11 NOTE — Assessment & Plan Note (Addendum)
Endorses several months of progressive exertional dyspnea associated with substernal chest discomfort.  Continue zetia (intolerance to tried statins).  Update EKG today.  Refer to cardiology for further evaluation.  rec start aspirin daily until seen by cardiology.

## 2020-05-11 NOTE — Assessment & Plan Note (Signed)
Ongoing over the past year.  Update Mg with other electrolytes and CPK in zetia use.

## 2020-05-11 NOTE — Assessment & Plan Note (Signed)
R>L varicose veins with tenderness to palpation of lower leg. Reviewed conservative management to include leg elevation, avoiding salt/sodium, drinking water.  She has compression stockings she will start using regularly.  Discussed VVS evaluation if no benefit with above.

## 2020-05-11 NOTE — Patient Instructions (Addendum)
Start using compression stockings regularly, take off at night time. Continue leg elevation, avoid salt/sodium in the diet. Let me know if interested in referral to vascular surgeon (if legs worsening).  Continue Coenzyme Q10 100mg  daily.  For worsening shortness of breath with exertion - we will refer you back to cardiology for further evaluation.  Update EKG today.

## 2020-05-12 ENCOUNTER — Encounter: Payer: Self-pay | Admitting: Gastroenterology

## 2020-05-12 LAB — BASIC METABOLIC PANEL
BUN: 17 mg/dL (ref 6–23)
CO2: 30 mEq/L (ref 19–32)
Calcium: 9.6 mg/dL (ref 8.4–10.5)
Chloride: 102 mEq/L (ref 96–112)
Creatinine, Ser: 0.76 mg/dL (ref 0.40–1.20)
GFR: 74.62 mL/min (ref >60.00)
Glucose, Bld: 108 mg/dL — ABNORMAL HIGH (ref 70–99)
Potassium: 4.3 mEq/L (ref 3.5–5.1)
Sodium: 141 mEq/L (ref 135–145)

## 2020-05-12 LAB — MAGNESIUM: Magnesium: 2 mg/dL (ref 1.5–2.5)

## 2020-05-12 LAB — CK: Total CK: 101 U/L (ref 7–177)

## 2020-05-13 NOTE — Assessment & Plan Note (Signed)
Update EKG today.  

## 2020-05-26 ENCOUNTER — Ambulatory Visit (AMBULATORY_SURGERY_CENTER): Payer: Self-pay | Admitting: *Deleted

## 2020-05-26 ENCOUNTER — Other Ambulatory Visit: Payer: Self-pay

## 2020-05-26 VITALS — Ht 65.0 in | Wt 155.8 lb

## 2020-05-26 DIAGNOSIS — Z86010 Personal history of colonic polyps: Secondary | ICD-10-CM

## 2020-05-26 NOTE — Progress Notes (Signed)
Pt has hx PONV, denies being told they were difficult to intubate, or hx/fam hx of malignant hyperthermia per pt  No egg or soy allergy  No home oxygen use   No medications for weight loss taken  Pt denies constipation issues  Plenvu sample given

## 2020-05-27 ENCOUNTER — Encounter: Payer: Self-pay | Admitting: Gastroenterology

## 2020-06-02 ENCOUNTER — Ambulatory Visit: Payer: Medicare Other | Admitting: Cardiovascular Disease

## 2020-06-02 ENCOUNTER — Other Ambulatory Visit: Payer: Self-pay

## 2020-06-02 ENCOUNTER — Encounter: Payer: Self-pay | Admitting: Cardiovascular Disease

## 2020-06-02 VITALS — BP 140/90 | HR 64 | Ht 65.0 in | Wt 155.0 lb

## 2020-06-02 DIAGNOSIS — I700 Atherosclerosis of aorta: Secondary | ICD-10-CM

## 2020-06-02 DIAGNOSIS — E782 Mixed hyperlipidemia: Secondary | ICD-10-CM

## 2020-06-02 DIAGNOSIS — M7989 Other specified soft tissue disorders: Secondary | ICD-10-CM | POA: Diagnosis not present

## 2020-06-02 DIAGNOSIS — R0600 Dyspnea, unspecified: Secondary | ICD-10-CM | POA: Diagnosis not present

## 2020-06-02 DIAGNOSIS — I10 Essential (primary) hypertension: Secondary | ICD-10-CM

## 2020-06-02 MED ORDER — LOSARTAN POTASSIUM 25 MG PO TABS: 25.0000 mg | ORAL_TABLET | Freq: Every day | ORAL | 3 refills | Status: DC

## 2020-06-02 MED ORDER — HYDROCHLOROTHIAZIDE 12.5 MG PO CAPS: 12.5000 mg | ORAL_CAPSULE | Freq: Every day | ORAL | 3 refills | Status: DC

## 2020-06-02 NOTE — Progress Notes (Signed)
Cardiology Office Note  Date:  06/02/2020   ID:  Kristin Kramer, Kristin Kramer 02-25-1941, MRN [id number]  PCP:  [name], MD   Chief Complaint  Patient presents with  . New Patient (Initial Visit)    Establish care with provider for DOE and elevated variation in blood pressure with bilateral ankle swelling. Medications verbally reviewed with patient.     HPI:  Kristin Kramer is a very pleasant 80 year old woman with a history of hyperlipidemia, osteoarthritis of her knees, total knee replacement 01/02/2012,  Chest pain postoperatively previously presenting for abnormal EKG and preop eval 2013, Chronic muscle cramping x 1 year Who presents by referral from Dr. [name] for new patient evaluation of her exertional dyspnea  Recently seen by primary care, reported having several months worsening shortness of breath Some substernal chest discomfort  CT scan May 2019 mild diffuse aortic atherosclerosis Images pulled up and reviewed  "I am very SOB" Does exercise: 2-3x a week Walks slower than with a friend  BP elevated in the office BP at home: 601 systolic, 88 diastolic  Increased LE edema Venous insuff  Lab work reviewed Total cholesterol 213 down from 260 LDL 135 down from 176 On Zetia A1c 5.7  EKG personally reviewed by myself on todays visit nsr rate 64 bpm no ST or T wave changes  Chest pain postoperatively in 2013 after knee replacement surgery . Prior  echocardiogram  was normal, cardiac enzymes were normal.  No stress test was performed.  Notes indicating in the past she did not tolerate red yeast rice, Lipitor, pravastatin. She had myalgias.   Family history  father passed away at age 53 from lung cancer,  mother at age 48 for lung cancer. They were both smokers    PMH:   has a past medical history of Abdominal pain, other specified site (10.2014), Abnormal ECG, Allergy, Arthritis, Closed right hip fracture (HCC) (07/07/2017), Depression, GERD  (gastroesophageal reflux disease), H/O seasonal allergies, Herpes zoster (02/19/2014), History of pneumonia, History of UTI, HLD (hyperlipidemia), Hypothyroidism, Knee osteoarthritis (2013, 2014), Knee osteoarthritis, Mesenteric lymphadenitis (2014), Migraines, Osteopenia (08/2016), Post-operative nausea and vomiting, Pre-diabetes, Refusal of blood transfusions as patient is Jehovah's Witness, Seasonal allergies, 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  PSH:    Past Surgical History:  Procedure Laterality Date  . AUGMENTATION MAMMAPLASTY    . BREAST ENHANCEMENT SURGERY    . BUNIONECTOMY  2013   R and L foot  . CATARACT EXTRACTION Bilateral 2013   R, pending L  . COLONOSCOPY  2007   no records received  . COLONOSCOPY  08/2014   tubular adenoma, rpt 5 yrs [facility] Plan)  . CONVERSION TO TOTAL HIP Right 12/07/2017   Procedure: CONVERSION TO TOTAL HIP ( REMOVING SMITH AND NEPHEW NAIL);  Surgeon: Hessie Knows, MD;  Location: [facility];  Service: Orthopedics;  Laterality: Right;  . COSMETIC SURGERY  2002   face lift  . dexa  06/2008   T -1.4 spine and hip  . ETHMOIDECTOMY Bilateral 05/19/2016   Procedure: ETHMOIDECTOMY;  Surgeon: Carloyn Manner, MD;  Location: [facility];  Service: ENT;  Laterality: Bilateral;  . EYE SURGERY     bilateral cataract surgery  . FRONTAL SINUS EXPLORATION Bilateral 05/19/2016   Procedure: FRONTAL SINUS EXPLORATION;  Surgeon: Carloyn Manner, MD;  Location: [facility];  Service: ENT;  Laterality: Bilateral;  . INTRAMEDULLARY (IM) NAIL INTERTROCHANTERIC Right 07/08/2017   Procedure: RIGHT  INTERTROCHANTRIC IM NAIL;  Surgeon: Leandrew Koyanagi, MD;  Location: [facility];  Service: Orthopedics;  Laterality: Right;  . MAXILLARY ANTROSTOMY Bilateral 05/19/2016   Procedure: MAXILLARY ANTROSTOMY;  Surgeon: Carloyn Manner, MD;  Location: [facility];  Service: ENT;  Laterality: Bilateral;  . MOHS  SURGERY  2011   R leg  . PARTIAL HYSTERECTOMY  1980   fibroids, ovaries remained  . SINUS ENDO W/FUSION N/A 05/19/2016   Procedure: ENDOSCOPIC SINUS SURGERY WITH NAVIGATION;  Surgeon: Carloyn Manner, MD;  Location: [facility];  Service: ENT;  Laterality: N/A;  . SPHENOIDECTOMY Bilateral 05/19/2016   Procedure: Coralee Pesa;  Surgeon: Carloyn Manner, MD;  Location: [facility];  Service: ENT;  Laterality: Bilateral;  . SQUAMOUS CELL CARCINOMA EXCISION Right 07/13/2016   Dr. Rolm Bookbinder, [facility]  . TONSILLECTOMY AND ADENOIDECTOMY  1945  . TOTAL HIP REVISION Right 08/07/2019   Procedure: Right hip acetabular versus total hip arthroplasty revision-posterior;  Surgeon: Gaynelle Arabian, MD;  Location: [facility];  Service: Orthopedics;  Laterality: Right;  175min  . TOTAL KNEE ARTHROPLASTY  01/02/2012   Right - Surgeon: Lorn Junes, MD  . TOTAL KNEE ARTHROPLASTY Left 03/18/2013   Surgeon: Lorn Junes, MD    Current Outpatient Medications  Medication Sig Dispense Refill  . aspirin EC 81 MG tablet Take 1 tablet (81 mg total) by mouth daily. Swallow whole.    . calcium carbonate (OSCAL) 1500 (600 Ca) MG TABS tablet Take 600 mg of elemental calcium by mouth daily with breakfast.    . Cholecalciferol (VITAMIN D-3) 5000 units TABS Take 5,000 Units by mouth daily.     . Coenzyme Q10 (COQ10 PO) Take 1 capsule by mouth daily.     Marland Kitchen donepezil (ARICEPT) 5 MG tablet TAKE 1 TABLET BY MOUTH EVERY DAY 90 tablet 3  . ezetimibe (ZETIA) 10 MG tablet TAKE 1 TABLET BY MOUTH EVERY DAY 90 tablet 3  . fexofenadine (ALLEGRA ALLERGY) 180 MG tablet Take 1 tablet (180 mg total) by mouth daily.    . hydrochlorothiazide (MICROZIDE) 12.5 MG capsule Take 1 capsule (12.5 mg total) by mouth daily. 90 capsule 3  . ipratropium (ATROVENT) 0.03 % nasal spray Place 2 sprays into both nostrils every 12 (twelve) hours. 30 mL 3  . levothyroxine (SYNTHROID) 25 MCG tablet TAKE 1 TABLET BY MOUTH EVERY DAY BEFORE BREAKFAST  90 tablet 3  . losartan (COZAAR) 25 MG tablet Take 1 tablet (25 mg total) by mouth daily. 90 tablet 3  . METAMUCIL FIBER PO Take by mouth daily.    . niacinamide 500 MG tablet Take 1 tablet (500 mg total) by mouth daily.    Marland Kitchen omeprazole (PRILOSEC) 40 MG capsule Take 1 capsule (40 mg total) by mouth daily. For 2-3 weeks then as needed for heartburn 30 capsule 3  . Potassium 99 MG TABS Take 1 tablet (99 mg total) by mouth daily.    . Probiotic Product (PROBIOTIC PO) Take 1 capsule by mouth daily.     . sertraline (ZOLOFT) 25 MG tablet ALTERNATE 1 TABLET AND 1.5 TABLETS EVERY OTHER DAY 113 tablet 3  . TURMERIC PO Take 900 mg by mouth daily.      No current facility-administered medications for this visit.     Allergies:   Blood-group specific substance, Lipitor [atorvastatin], and Pravastatin   Social History:  The patient  reports that she has never smoked. She has never used smokeless tobacco. She reports current alcohol use. She reports that she does not  use drugs.   Family History:   family history includes Cancer in her father; Cancer (age of onset: 66) in her maternal grandmother; Cancer (age of onset: 37) in her mother; Coronary artery disease (age of onset: 14) in her paternal grandmother; Dementia (age of onset: 16) in her mother.    Review of Systems: Review of Systems  Constitutional: Negative.   HENT: Negative.   Respiratory: Positive for shortness of breath.   Cardiovascular: Negative.   Gastrointestinal: Negative.   Musculoskeletal: Negative.   Neurological: Negative.   Psychiatric/Behavioral: Negative.   All other systems reviewed and are negative.   PHYSICAL EXAM: VS:  BP 140/90 (BP Location: Right Arm, Patient Position: Sitting, Cuff Size: Normal)   Pulse 64   Ht 5\' 5"  (1.651 m)   Wt 155 lb (70.3 kg)   SpO2 97%   BMI 25.79 kg/m  , BMI Body mass index is 25.79 kg/m. GEN: Well nourished, well developed, in no acute distress HEENT: normal Neck: no JVD, carotid  bruits, or masses Cardiac: RRR; no murmurs, rubs, or gallops,no edema  Respiratory:  clear to auscultation bilaterally, normal work of breathing GI: soft, nontender, nondistended, + BS MS: no deformity or atrophy Skin: warm and dry, no rash Neuro:  Strength and sensation are intact Psych: euthymic mood, full affect   Recent Labs: 07/10/2019: Pro B Natriuretic peptide (BNP) 176.0 08/29/2019: ALT 22; Hemoglobin 12.5; Platelets 427.0; TSH 3.37 05/11/2020: BUN 17; Creatinine, Ser 0.76; Magnesium 2.0; Potassium 4.3; Sodium 141    Lipid Panel Lab Results  Component Value Date   CHOL 213 (H) 08/29/2019   HDL 52.60 08/29/2019   LDLCALC 135 (H) 08/29/2019   TRIG 125.0 08/29/2019      Wt Readings from Last 3 Encounters:  06/02/20 155 lb (70.3 kg)  05/26/20 155 lb 12.8 oz (70.7 kg)  05/11/20 153 lb 9 oz (69.7 kg)       ASSESSMENT AND PLAN:  Problem List Items Addressed This Visit      Cardiology Problems   HLD (hyperlipidemia)   Relevant Medications   losartan (COZAAR) 25 MG tablet   hydrochlorothiazide (MICROZIDE) 12.5 MG capsule    Other Visit Diagnoses    Dyspnea, unspecified type    -  Primary   Relevant Orders   EKG 12-Lead   Essential hypertension       Relevant Medications   losartan (COZAAR) 25 MG tablet   hydrochlorothiazide (MICROZIDE) 12.5 MG capsule   Leg swelling         Shortness of breath Mild symptoms, worse with hills, trying to walk at fast pace May be conditioning Blood pressure running high, mild ankle swelling Unable to exclude symptoms from fluid retention/elevated right heart pressures -Recommend she start losartan 25 mg daily We will add HCTZ 12.5 mg daily Suggested she continue her walking program, if no improvement in her symptoms suggested she call our office Echocardiogram could be performed.  She preferred to delay ordering echocardiogram to see how she feels -We did also discuss stress testing if symptoms persist Recommend she moderate  her salt and fluid intake  Leg swelling Likely component of fluid overload with venous insufficiency Suggested compression hose, HCTZ as above  Essential hypertension Add losartan and HCTZ as above  Aortic atherosclerosis Currently on Zetia, has statin intolerance Mild diffuse plaque noted on image review  Hyperlipidemia Plan as above     Total encounter time more than 60 minutes  Greater than 50% was spent in counseling and coordination of  care with the patient    Signed, Esmond Plants, M.D., Ph.D. [place], [place]

## 2020-06-02 NOTE — Patient Instructions (Addendum)
Medication Instructions:  Please START   losartan 25 mg once a day  HCTZ 12.5 mg once a day  90 day refills with one year supply sent in to pharmacy   Lab work: No new labs needed  Testing/Procedures: No new testing needed   Follow-Up: At [facility], you and your health needs are our priority.  As part of our continuing mission to provide you with exceptional heart care, we have created designated Provider Care Teams.  These Care Teams include your primary Cardiologist (physician) and Advanced Practice Providers (APPs -  Physician Assistants and Nurse Practitioners) who all work together to provide you with the care you need, when you need it.  . You will need a follow up appointment as needed  Call to make an appt when your want to be seen if you have any concerning cardiac issues   . Providers on your designated Care Team:   . Murray Hodgkins, NP . Christell Faith, PA-C . Marrianne Mood, PA-C   COVID-19 Vaccine Information can be found at: [email or web address].co.uk For questions related to vaccine distribution or appointments, please email [email or web address] .[email or web address] or call [phone])[phone].

## 2020-06-04 ENCOUNTER — Other Ambulatory Visit: Payer: Self-pay | Admitting: Family Medicine

## 2020-06-04 NOTE — Telephone Encounter (Signed)
E-scribed refills.  Plz schedule wellness, lab and cpe visits.

## 2020-06-05 HISTORY — PX: COLONOSCOPY: SHX174

## 2020-06-09 ENCOUNTER — Encounter: Payer: Self-pay | Admitting: Gastroenterology

## 2020-06-09 ENCOUNTER — Other Ambulatory Visit: Payer: Self-pay

## 2020-06-09 ENCOUNTER — Ambulatory Visit (AMBULATORY_SURGERY_CENTER): Payer: Medicare Other | Admitting: Gastroenterology

## 2020-06-09 VITALS — BP 120/72 | HR 70 | Temp 98.6°F | Resp 17 | Ht 65.0 in | Wt 155.8 lb

## 2020-06-09 DIAGNOSIS — D120 Benign neoplasm of cecum: Secondary | ICD-10-CM | POA: Diagnosis not present

## 2020-06-09 DIAGNOSIS — D125 Benign neoplasm of sigmoid colon: Secondary | ICD-10-CM | POA: Diagnosis not present

## 2020-06-09 DIAGNOSIS — Z1211 Encounter for screening for malignant neoplasm of colon: Secondary | ICD-10-CM | POA: Diagnosis not present

## 2020-06-09 DIAGNOSIS — D123 Benign neoplasm of transverse colon: Secondary | ICD-10-CM

## 2020-06-09 DIAGNOSIS — Z86010 Personal history of colonic polyps: Secondary | ICD-10-CM

## 2020-06-09 MED ORDER — SODIUM CHLORIDE 0.9 % IV SOLN: 500.0000 mL | Freq: Once | INTRAVENOUS | Status: DC

## 2020-06-09 NOTE — Progress Notes (Signed)
To PACU, VSS. Report to [email or web address] 

## 2020-06-09 NOTE — Progress Notes (Signed)
Pt's states no medical or surgical changes since previsit or office visit.  CW - vitals 

## 2020-06-09 NOTE — Op Note (Signed)
[facility] Patient Name: Kristin Kramer Procedure Date: 06/09/2020 1:44 PM MRN: [id number] Endoscopist: Ladene Artist , MD Age: 80 Referring MD:  Date of Birth: 1941-01-16 Gender: Female Account #: [id number] Procedure:                Colonoscopy Indications:              Surveillance: Personal history of adenomatous                            polyps on last colonoscopy 5 years ago Medicines:                Monitored Anesthesia Care Procedure:                Pre-Anesthesia Assessment:                           - Prior to the procedure, a History and Physical                            was performed, and patient medications and                            allergies were reviewed. The patient's tolerance of                            previous anesthesia was also reviewed. The risks                            and benefits of the procedure and the sedation                            options and risks were discussed with the patient.                            All questions were answered, and informed consent                            was obtained. Prior Anticoagulants: The patient has                            taken no previous anticoagulant or antiplatelet                            agents. ASA Grade Assessment: II - A patient with                            mild systemic disease. After reviewing the risks                            and benefits, the patient was deemed in                            satisfactory condition to undergo the procedure.  After obtaining informed consent, the colonoscope                            was passed under direct vision. Throughout the                            procedure, the patient's blood pressure, pulse, and                            oxygen saturations were monitored continuously. The                            Olympus PCF-H190DL ([id number]) Colonoscope was                            introduced through the anus  and advanced to the the                            cecum, identified by appendiceal orifice and                            ileocecal valve. The ileocecal valve, appendiceal                            orifice, and rectum were photographed. The quality                            of the bowel preparation was excellent. The                            colonoscopy was performed without difficulty. The                            patient tolerated the procedure well. Scope In: 1:46:48 PM Scope Out: 2:06:05 PM Scope Withdrawal Time: 0 hours 14 minutes 33 seconds  Total Procedure Duration: 0 hours 19 minutes 17 seconds  Findings:                 The perianal and digital rectal examinations were                            normal.                           Four sessile polyps were found in the sigmoid                            colon, transverse colon and cecum. The polyps were                            6 to 7 mm in size. These polyps were removed with a                            cold snare. Resection and retrieval were complete.  The exam was otherwise without abnormality on                            direct and retroflexion views. Complications:            No immediate complications. Estimated blood loss:                            None. Estimated Blood Loss:     Estimated blood loss: none. Impression:               - Four 6 to 7 mm polyps in the sigmoid colon (1),                            in the transverse colon (2) and in the cecum (1),                            removed with a cold snare. Resected and retrieved.                           - The examination was otherwise normal on direct                            and retroflexion views. Recommendation:           - Patient has a contact number available for                            emergencies. The signs and symptoms of potential                            delayed complications were discussed with the                             patient. Return to normal activities tomorrow.                            Written discharge instructions were provided to the                            patient.                           - Resume previous diet.                           - Continue present medications.                           - Await pathology results.                           - No repeat colonoscopy due to age. Ladene Artist, MD 06/09/2020 2:11:58 PM This report has been signed electronically.

## 2020-06-09 NOTE — Patient Instructions (Signed)
Handout on polyps given. ° °YOU HAD AN ENDOSCOPIC PROCEDURE TODAY AT THE [facility]:   Refer to the procedure report that was given to you for any specific questions about what was found during the examination.  If the procedure report does not answer your questions, please call your gastroenterologist to clarify.  If you requested that your care partner not be given the details of your procedure findings, then the procedure report has been included in a sealed envelope for you to review at your convenience later. ° °YOU SHOULD EXPECT: Some feelings of bloating in the abdomen. Passage of more gas than usual.  Walking can help get rid of the air that was put into your GI tract during the procedure and reduce the bloating. If you had a lower endoscopy (such as a colonoscopy or flexible sigmoidoscopy) you may notice spotting of blood in your stool or on the toilet paper. If you underwent a bowel prep for your procedure, you may not have a normal bowel movement for a few days. ° °Please Note:  You might notice some irritation and congestion in your nose or some drainage.  This is from the oxygen used during your procedure.  There is no need for concern and it should clear up in a day or so. ° °SYMPTOMS TO REPORT IMMEDIATELY: ° °Following lower endoscopy (colonoscopy or flexible sigmoidoscopy): ° Excessive amounts of blood in the stool ° Significant tenderness or worsening of abdominal pains ° Swelling of the abdomen that is new, acute ° Fever of 100°F or higher ° °For urgent or emergent issues, a gastroenterologist can be reached at any hour by calling [phone]. °Do not use MyChart messaging for urgent concerns.  ° ° °DIET:  We do recommend a small meal at first, but then you may proceed to your regular diet.  Drink plenty of fluids but you should avoid alcoholic beverages for 24 hours. ° °ACTIVITY:  You should plan to take it easy for the rest of today and you should NOT DRIVE or use heavy machinery  until tomorrow (because of the sedation medicines used during the test).   ° °FOLLOW UP: °Our staff will call the number listed on your records 48-72 hours following your procedure to check on you and address any questions or concerns that you may have regarding the information given to you following your procedure. If we do not reach you, we will leave a message.  We will attempt to reach you two times.  During this call, we will ask if you have developed any symptoms of COVID 19. If you develop any symptoms (ie: fever, flu-like symptoms, shortness of breath, cough etc.) before then, please call [phone].  If you test positive for Covid 19 in the 2 weeks post procedure, please call and report this information to us.   ° °If any biopsies were taken you will be contacted by phone or by letter within the next 1-3 weeks.  Please call us at [phone] if you have not heard about the biopsies in 3 weeks.  ° ° °SIGNATURES/CONFIDENTIALITY: °You and/or your care partner have signed paperwork which will be entered into your electronic medical record.  These signatures attest to the fact that that the information above on your After Visit Summary has been reviewed and is understood.  Full responsibility of the confidentiality of this discharge information lies with you and/or your care-partner.  °

## 2020-06-09 NOTE — Progress Notes (Signed)
Called to room to assist during endoscopic procedure.  Patient ID and intended procedure confirmed with present staff. Received instructions for my participation in the procedure from the performing physician.  

## 2020-06-10 NOTE — Telephone Encounter (Signed)
Left detailed vm (per DPR) to call and schedule cpe and labs. EM

## 2020-06-11 ENCOUNTER — Telehealth: Payer: Self-pay

## 2020-06-11 NOTE — Telephone Encounter (Signed)
LVM

## 2020-06-17 ENCOUNTER — Telehealth: Payer: Self-pay

## 2020-06-17 NOTE — Chronic Care Management (AMB) (Addendum)
Chronic Care Management Pharmacy Assistant   Name: Kristin Kramer  MRN: [id number] DOB: 1940-08-10  Reason for Encounter: Disease State - General   Conditions to be addressed/monitored: HLD  Recent office visits:  05/11/20- Dr. [name]- PCP - Started potassium 99 mg daily.   Recent consult visits:  06/09/20- Dr. Lucio Edward- [facility]- Endoscopic procedure 06/02/20- Dr. Ida Rogue- Cardiology- Started hydrochlorothiazide 12.5mg , losartan 25 mg. Pt states no longer taking famotidine 20 mg or ondansetron 8 mg.  Hospital visits:  None in previous 6 months  Medications: Outpatient Encounter Medications as of 06/17/2020  Medication Sig   aspirin EC 81 MG tablet Take 1 tablet (81 mg total) by mouth daily. Swallow whole. (Patient not taking: Reported on 06/09/2020)   calcium carbonate (OSCAL) 1500 (600 Ca) MG TABS tablet Take 600 mg of elemental calcium by mouth daily with breakfast.   Cholecalciferol (VITAMIN D-3) 5000 units TABS Take 5,000 Units by mouth daily.    Coenzyme Q10 (COQ10 PO) Take 1 capsule by mouth daily.    donepezil (ARICEPT) 5 MG tablet TAKE 1 TABLET BY MOUTH EVERY DAY   ezetimibe (ZETIA) 10 MG tablet TAKE 1 TABLET BY MOUTH EVERY DAY   fexofenadine (ALLEGRA ALLERGY) 180 MG tablet Take 1 tablet (180 mg total) by mouth daily.   hydrochlorothiazide (MICROZIDE) 12.5 MG capsule Take 1 capsule (12.5 mg total) by mouth daily.   ipratropium (ATROVENT) 0.03 % nasal spray PLACE 2 SPRAYS INTO BOTH NOSTRILS EVERY 12 (TWELVE) HOURS.   levothyroxine (SYNTHROID) 25 MCG tablet TAKE 1 TABLET BY MOUTH EVERY DAY BEFORE BREAKFAST   losartan (COZAAR) 25 MG tablet Take 1 tablet (25 mg total) by mouth daily. (Patient not taking: Reported on 06/09/2020)   METAMUCIL FIBER PO Take by mouth daily.   niacinamide 500 MG tablet Take 1 tablet (500 mg total) by mouth daily.   omeprazole (PRILOSEC) 40 MG capsule TAKE 1 CAPSULE (40 MG TOTAL) BY MOUTH DAILY. FOR 2-3 WEEKS THEN AS NEEDED FOR HEARTBURN    Potassium 99 MG TABS Take 1 tablet (99 mg total) by mouth daily.   Probiotic Product (PROBIOTIC PO) Take 1 capsule by mouth daily.    sertraline (ZOLOFT) 25 MG tablet ALTERNATE 1 TABLET AND 1.5 TABLETS EVERY OTHER DAY   TURMERIC PO Take 900 mg by mouth daily.    No facility-administered encounter medications on file as of 06/17/2020.    Contacted patient for general adherence. Since last visit with CPP, the following interventions have been made: 06/02/20- Dr. Ida Rogue- Cardiology- Started hydrochlorothiazide 12.5mg , losartan 25 mg due to elevated blood pressure.  The patient has not had an ED visit since their last CPP follow up. The patient's current Chronic and PDC medications are: Losartan 25 mg 1 tablet daily. Last filled  06/02/20 for 90 day supply.   They currently do not have a greater than 5 day gap between last fill.The patient has not had problems with their health. The patient has not had any problems with their pharmacy. The patient has not had any side effects with their medicines. The patient has no recommendations for improvements in managing care. Patient states recent blood pressures are as follows:  110/72  118/71  112/65                         Star Rating Drugs: Losartan 25 mg 06/02/20 90DS   Follow-Up:  Pharmacist Review  [name], CPP notified  Margaretmary Dys, [facility]  Assistant [phone]  I have reviewed the care management and care coordination activities outlined in this encounter and I am certifying that I agree with the content of this note. No further action required.  [name], PharmD Clinical Pharmacist [facility] [phone]

## 2020-06-23 ENCOUNTER — Encounter: Payer: Self-pay | Admitting: Gastroenterology

## 2020-06-26 DIAGNOSIS — L814 Other melanin hyperpigmentation: Secondary | ICD-10-CM | POA: Diagnosis not present

## 2020-06-26 DIAGNOSIS — D2272 Melanocytic nevi of left lower limb, including hip: Secondary | ICD-10-CM | POA: Diagnosis not present

## 2020-06-26 DIAGNOSIS — L578 Other skin changes due to chronic exposure to nonionizing radiation: Secondary | ICD-10-CM | POA: Diagnosis not present

## 2020-06-26 DIAGNOSIS — L718 Other rosacea: Secondary | ICD-10-CM | POA: Diagnosis not present

## 2020-06-26 DIAGNOSIS — D1801 Hemangioma of skin and subcutaneous tissue: Secondary | ICD-10-CM | POA: Diagnosis not present

## 2020-06-26 DIAGNOSIS — L820 Inflamed seborrheic keratosis: Secondary | ICD-10-CM | POA: Diagnosis not present

## 2020-06-26 DIAGNOSIS — L570 Actinic keratosis: Secondary | ICD-10-CM | POA: Diagnosis not present

## 2020-06-26 DIAGNOSIS — Z85828 Personal history of other malignant neoplasm of skin: Secondary | ICD-10-CM | POA: Diagnosis not present

## 2020-06-26 DIAGNOSIS — L821 Other seborrheic keratosis: Secondary | ICD-10-CM | POA: Diagnosis not present

## 2020-07-07 ENCOUNTER — Other Ambulatory Visit: Payer: Self-pay | Admitting: Family Medicine

## 2020-07-07 DIAGNOSIS — Z1231 Encounter for screening mammogram for malignant neoplasm of breast: Secondary | ICD-10-CM

## 2020-08-20 ENCOUNTER — Other Ambulatory Visit: Payer: Self-pay | Admitting: Family Medicine

## 2020-08-20 NOTE — Telephone Encounter (Signed)
Aricept Last filled:  05/20/20, #90 Last OV:  05/11/20, varicose veins Next OV:  09/08/20, AWV prt 2

## 2020-08-27 ENCOUNTER — Ambulatory Visit
Admission: RE | Admit: 2020-08-27 | Discharge: 2020-08-27 | Disposition: A | Discharge status: Home / Self Care | Payer: Medicare Other | Source: Ambulatory Visit | Attending: Family Medicine | Admitting: Family Medicine

## 2020-08-27 ENCOUNTER — Other Ambulatory Visit: Payer: Self-pay

## 2020-08-27 DIAGNOSIS — Z1231 Encounter for screening mammogram for malignant neoplasm of breast: Secondary | ICD-10-CM | POA: Diagnosis not present

## 2020-08-30 ENCOUNTER — Other Ambulatory Visit: Payer: Self-pay | Admitting: Family Medicine

## 2020-08-30 DIAGNOSIS — E559 Vitamin D deficiency, unspecified: Secondary | ICD-10-CM

## 2020-08-30 DIAGNOSIS — E785 Hyperlipidemia, unspecified: Secondary | ICD-10-CM

## 2020-08-30 DIAGNOSIS — K654 Sclerosing mesenteritis: Secondary | ICD-10-CM

## 2020-08-30 DIAGNOSIS — E038 Other specified hypothyroidism: Secondary | ICD-10-CM

## 2020-08-31 ENCOUNTER — Other Ambulatory Visit: Payer: Self-pay | Admitting: Family Medicine

## 2020-08-31 ENCOUNTER — Ambulatory Visit: Payer: Medicare Other

## 2020-08-31 DIAGNOSIS — R928 Other abnormal and inconclusive findings on diagnostic imaging of breast: Secondary | ICD-10-CM

## 2020-09-01 ENCOUNTER — Other Ambulatory Visit: Payer: Self-pay

## 2020-09-01 ENCOUNTER — Other Ambulatory Visit (INDEPENDENT_AMBULATORY_CARE_PROVIDER_SITE_OTHER): Payer: Medicare Other

## 2020-09-01 DIAGNOSIS — E785 Hyperlipidemia, unspecified: Secondary | ICD-10-CM | POA: Diagnosis not present

## 2020-09-01 DIAGNOSIS — K654 Sclerosing mesenteritis: Secondary | ICD-10-CM

## 2020-09-01 DIAGNOSIS — E559 Vitamin D deficiency, unspecified: Secondary | ICD-10-CM | POA: Diagnosis not present

## 2020-09-01 DIAGNOSIS — E038 Other specified hypothyroidism: Secondary | ICD-10-CM

## 2020-09-01 LAB — CBC WITH DIFFERENTIAL/PLATELET
Basophils Absolute: 0 10*3/uL (ref 0.0–0.1)
Basophils Relative: 0.5 % (ref 0.0–3.0)
Eosinophils Absolute: 0.3 10*3/uL (ref 0.0–0.7)
Eosinophils Relative: 4.5 % (ref 0.0–5.0)
HCT: 39.7 % (ref 36.0–46.0)
Hemoglobin: 13.4 g/dL (ref 12.0–15.0)
Lymphocytes Relative: 26.8 % (ref 12.0–46.0)
Lymphs Abs: 1.6 10*3/uL (ref 0.7–4.0)
MCHC: 33.7 g/dL (ref 30.0–36.0)
MCV: 86.8 fl (ref 78.0–100.0)
Monocytes Absolute: 0.7 10*3/uL (ref 0.1–1.0)
Monocytes Relative: 11.2 % (ref 3.0–12.0)
Neutro Abs: 3.5 10*3/uL (ref 1.4–7.7)
Neutrophils Relative %: 57 % (ref 43.0–77.0)
Platelets: 240 10*3/uL (ref 150.0–400.0)
RBC: 4.57 Mil/uL (ref 3.87–5.11)
RDW: 13.7 % (ref 11.5–15.5)
WBC: 6.1 10*3/uL (ref 4.0–10.5)

## 2020-09-01 LAB — LIPID PANEL
Cholesterol: 213 mg/dL — ABNORMAL HIGH (ref 0–200)
HDL: 58.5 mg/dL (ref >39.00)
LDL Cholesterol: 130 mg/dL — ABNORMAL HIGH (ref 0–99)
NonHDL: 154.48
Total CHOL/HDL Ratio: 4
Triglycerides: 120 mg/dL (ref 0.0–149.0)
VLDL: 24 mg/dL (ref 0.0–40.0)

## 2020-09-01 LAB — COMPREHENSIVE METABOLIC PANEL
ALT: 21 U/L (ref 0–35)
AST: 24 U/L (ref 0–37)
Albumin: 4.6 g/dL (ref 3.5–5.2)
Alkaline Phosphatase: 111 U/L (ref 39–117)
BUN: 21 mg/dL (ref 6–23)
CO2: 29 mEq/L (ref 19–32)
Calcium: 10.4 mg/dL (ref 8.4–10.5)
Chloride: 101 mEq/L (ref 96–112)
Creatinine, Ser: 0.86 mg/dL (ref 0.40–1.20)
GFR: 64.19 mL/min (ref >60.00)
Glucose, Bld: 93 mg/dL (ref 70–99)
Potassium: 4.7 mEq/L (ref 3.5–5.1)
Sodium: 140 mEq/L (ref 135–145)
Total Bilirubin: 0.4 mg/dL (ref 0.2–1.2)
Total Protein: 7.1 g/dL (ref 6.0–8.3)

## 2020-09-01 LAB — VITAMIN D 25 HYDROXY (VIT D DEFICIENCY, FRACTURES): VITD: 60.76 ng/mL (ref 30.00–100.00)

## 2020-09-01 LAB — TSH: TSH: 3.41 u[IU]/mL (ref 0.35–4.50)

## 2020-09-03 DIAGNOSIS — H20013 Primary iridocyclitis, bilateral: Secondary | ICD-10-CM | POA: Diagnosis not present

## 2020-09-04 ENCOUNTER — Other Ambulatory Visit: Payer: Self-pay | Admitting: Family Medicine

## 2020-09-08 ENCOUNTER — Other Ambulatory Visit: Payer: Self-pay

## 2020-09-08 ENCOUNTER — Encounter: Payer: Self-pay | Admitting: Family Medicine

## 2020-09-08 ENCOUNTER — Encounter: Payer: Self-pay | Admitting: Psychology

## 2020-09-08 ENCOUNTER — Ambulatory Visit (INDEPENDENT_AMBULATORY_CARE_PROVIDER_SITE_OTHER): Payer: Medicare Other | Admitting: Family Medicine

## 2020-09-08 VITALS — BP 112/68 | HR 84 | Temp 97.6°F | Ht 64.5 in | Wt 147.5 lb

## 2020-09-08 DIAGNOSIS — Z531 Procedure and treatment not carried out because of patient's decision for reasons of belief and group pressure: Secondary | ICD-10-CM

## 2020-09-08 DIAGNOSIS — IMO0001 Reserved for inherently not codable concepts without codable children: Secondary | ICD-10-CM

## 2020-09-08 DIAGNOSIS — M85859 Other specified disorders of bone density and structure, unspecified thigh: Secondary | ICD-10-CM

## 2020-09-08 DIAGNOSIS — R748 Abnormal levels of other serum enzymes: Secondary | ICD-10-CM

## 2020-09-08 DIAGNOSIS — R0609 Other forms of dyspnea: Secondary | ICD-10-CM

## 2020-09-08 DIAGNOSIS — Z0000 Encounter for general adult medical examination without abnormal findings: Secondary | ICD-10-CM | POA: Diagnosis not present

## 2020-09-08 DIAGNOSIS — E559 Vitamin D deficiency, unspecified: Secondary | ICD-10-CM

## 2020-09-08 DIAGNOSIS — F331 Major depressive disorder, recurrent, moderate: Secondary | ICD-10-CM

## 2020-09-08 DIAGNOSIS — R252 Cramp and spasm: Secondary | ICD-10-CM

## 2020-09-08 DIAGNOSIS — R413 Other amnesia: Secondary | ICD-10-CM

## 2020-09-08 DIAGNOSIS — E038 Other specified hypothyroidism: Secondary | ICD-10-CM

## 2020-09-08 DIAGNOSIS — E785 Hyperlipidemia, unspecified: Secondary | ICD-10-CM

## 2020-09-08 MED ORDER — SERTRALINE HCL 25 MG PO TABS: 37.5000 mg | ORAL_TABLET | Freq: Every day | ORAL | 3 refills | Status: DC

## 2020-09-08 NOTE — Assessment & Plan Note (Signed)
Ongoing difficulty. Possibly worse with hctz. Continue OTC potassium.

## 2020-09-08 NOTE — Assessment & Plan Note (Signed)
S/p cardiac evaluation. Some better since hctz started - continue this.

## 2020-09-08 NOTE — Assessment & Plan Note (Signed)

## 2020-09-08 NOTE — Assessment & Plan Note (Signed)
TSH stable on low dose levothyroxine.

## 2020-09-08 NOTE — Assessment & Plan Note (Signed)
Reviewed with patient - will order updated DEXA.  Would be eligible for treatment due to elevated hip fracture risk.

## 2020-09-08 NOTE — Assessment & Plan Note (Signed)
Chronic, stable on daily 37.5mg  sertraline - continue.

## 2020-09-08 NOTE — Assessment & Plan Note (Signed)
Has normalized.

## 2020-09-08 NOTE — Progress Notes (Signed)
Patient ID: Kristin Kramer, female    DOB: [birth year], 80 y.o.   MRN: [id number]  This visit was conducted in person.  BP 112/68   Pulse 84   Temp 97.6 F (36.4 C) (Temporal)   Ht 5' 4.5" (1.638 m)   Wt 147 lb 8 oz (66.9 kg)   SpO2 99%   BMI 24.93 kg/m    CC: AMW Subjective:   HPI: Kristin Kramer is a 80 y.o. female presenting on 09/08/2020 for Medicare Wellness   Did not see health advisor this year.   Hearing Screening   500Hz  1000Hz  2000Hz  4000Hz   Right ear 40 20 20 40  Left ear 40 25 20 40  Vision Screening - Comments:: Last eye exam, 08/2020.  [place] Visit from 09/08/2020 in [place] at [employer]  PHQ-2 Total Score 1       Fall Risk  09/08/2020 08/29/2019 08/15/2018 08/09/2017 09/23/2016  Falls in the past year? 0 0 1 Yes Yes  Comment - - fell in parking lot; fractures to ribs and stitches to face - Emmi Telephone Survey: data to providers prior to load  Number falls in past yr: - 0 0 1 2 or more  Comment - - - - Emmi Telephone Survey Actual Response = 20  Injury with Fall? - 0 1 Yes Yes  Risk for fall due to : - Medication side effect - - -  Follow up - Falls evaluation completed;Falls prevention discussed - - -   She increased sertraline to 37.5mg  daily due to depressed mood/irritability - this has helped.   Taking steroid eye drops through Dr Rick Duff.   Notes longstanding ongoing night time extremity cramping, worse since hctz started in 05/2020 (cardiology). Notes breathing is better with hctz as well as with more regular exercise routine.   On nicotinamide per derm rec due to h/o skin cancers   Notes progressive difficulty with memory predominantly noted as trouble with reading comprehension, trouble following conversation when in a group, difficulty expressing herself despite knowing what she wants to say.   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 Planning to reschedule.  Colonoscopy  06/09/2020 - 4 polyps removed, TA, no rpt recommended [facility]).  Well woman exam - 2012 by prior PCP. Hysterectomy for fibroids, ovaries remained. Menopause early 18s. Recent CT with normal adnexal region.  Breast cancer screening - mammogram Birads0 08/2020 - more detailed studies pending for 09/17/2020  DEXA 08/2016 -2.4 hip, -1.4 spine  DEXA 08/2018 - T -2.1 at hip, increased hip fx risk (4.6%). Has never been on bone strengthening medicine. Continues vit D 5000 IU daily as well as calcium 600mg  daily.  Lung cancer screening - not eligible  Flu shot yearly  COVID vaccine - Pfizer 04/2019, 05/2019, booster x2 12/2019, 07/2020 Pneumovax 2013, prevnar 2016  Tdap 10/2014  zostavax - 02/2013  Shingrix - 11/2016, 10/2017  Advanced directives - reviewed and scanned 08/2017. Lists Marvis Terance Hart and [employer] as HCPOA. Jehova's witness - no blood transfusions but would want minor fractions of blood discussed with her. Does not want life prolonged if hopeless situation.  Seat belt use discussed Sunscreen use discussed. No changing moles on skin. Sees Dr Ubaldo Glassing.  Non smoker  Alcohol - occasional  Dentist - Q6 months Eye exam - yearly  Bowel - no constipation Bladder - ongoing urinary incontinence day and night, progressive over the past 2 years, predominant stress incontinence symptoms, wears pad. Previously saw  urology. Discussed options.   Lives alone. Brother lives nearby. Occupation: retired, was Network engineer and housewife   Edu: 1 yr college   Activity: limited by knee osteoarthritis, [place] [employer] [place]. Diet: good water, fruits/vegetables daily     Relevant past medical, surgical, family and social history reviewed and updated as indicated. Interim medical history since our last visit reviewed. Allergies and medications reviewed and updated. Outpatient Medications Prior to Visit  Medication Sig Dispense Refill   aspirin EC 81 MG tablet Take 81 mg by mouth daily. Swallow whole.     calcium  carbonate (OSCAL) 1500 (600 Ca) MG TABS tablet Take 600 mg of elemental calcium by mouth daily with breakfast.     Cholecalciferol (VITAMIN D-3) 5000 units TABS Take 5,000 Units by mouth daily.      Coenzyme Q10 (COQ10 PO) Take 1 capsule by mouth daily.      donepezil (ARICEPT) 5 MG tablet TAKE 1 TABLET BY MOUTH EVERY DAY 90 tablet 3   ezetimibe (ZETIA) 10 MG tablet TAKE 1 TABLET BY MOUTH EVERY DAY 90 tablet 0   fexofenadine (ALLEGRA ALLERGY) 180 MG tablet Take 1 tablet (180 mg total) by mouth daily.     ipratropium (ATROVENT) 0.03 % nasal spray PLACE 2 SPRAYS INTO BOTH NOSTRILS EVERY 12 (TWELVE) HOURS. 90 mL 1   levothyroxine (SYNTHROID) 25 MCG tablet TAKE 1 TABLET BY MOUTH EVERY DAY BEFORE BREAKFAST 90 tablet 0   METAMUCIL FIBER PO Take by mouth daily.     niacinamide 500 MG tablet Take 1 tablet (500 mg total) by mouth daily.     omeprazole (PRILOSEC) 40 MG capsule TAKE 1 CAPSULE (40 MG TOTAL) BY MOUTH DAILY. FOR 2-3 WEEKS THEN AS NEEDED FOR HEARTBURN 90 capsule 0   Potassium 99 MG TABS Take 1 tablet (99 mg total) by mouth daily.     prednisoLONE acetate (PRED FORTE) 1 % ophthalmic suspension PLEASE SEE ATTACHED FOR DETAILED DIRECTIONS     Probiotic Product (PROBIOTIC PO) Take 1 capsule by mouth daily.      TURMERIC PO Take 900 mg by mouth daily.      sertraline (ZOLOFT) 25 MG tablet ALTERNATE 1 TABLET AND 1.5 TABLETS EVERY OTHER DAY 113 tablet 3   hydrochlorothiazide (MICROZIDE) 12.5 MG capsule Take 1 capsule (12.5 mg total) by mouth daily. 90 capsule 3   losartan (COZAAR) 25 MG tablet Take 1 tablet (25 mg total) by mouth daily. (Patient not taking: Reported on 06/09/2020) 90 tablet 3   No facility-administered medications prior to visit.     Per HPI unless specifically indicated in ROS section below Review of Systems  Constitutional:  Negative for activity change, appetite change, chills, fatigue, fever and unexpected weight change.  HENT:  Negative for hearing loss.   Eyes:  Negative for  visual disturbance.  Respiratory:  Negative for cough, chest tightness, shortness of breath and wheezing.   Cardiovascular:  Positive for leg swelling. Negative for chest pain and palpitations.  Gastrointestinal:  Negative for abdominal distention, abdominal pain, blood in stool, constipation, diarrhea, nausea and vomiting.  Genitourinary:  Negative for difficulty urinating and hematuria.  Musculoskeletal:  Negative for arthralgias, myalgias and neck pain.  Skin:  Negative for rash.  Neurological:  Negative for dizziness, seizures, syncope and headaches.  Hematological:  Negative for adenopathy. Does not bruise/bleed easily.  Psychiatric/Behavioral:  Negative for dysphoric mood. The patient is not nervous/anxious.    Objective:  BP 112/68   Pulse 84   Temp 97.6 F (36.4  C) (Temporal)   Ht 5' 4.5" (1.638 m)   Wt 147 lb 8 oz (66.9 kg)   SpO2 99%   BMI 24.93 kg/m   Wt Readings from Last 3 Encounters:  09/08/20 147 lb 8 oz (66.9 kg)  06/09/20 155 lb 12.8 oz (70.7 kg)  06/02/20 155 lb (70.3 kg)      Physical Exam Vitals and nursing note reviewed.  Constitutional:      Appearance: Normal appearance. She is not ill-appearing.  HENT:     Head: Normocephalic and atraumatic.     Right Ear: Tympanic membrane, ear canal and external ear normal. There is no impacted cerumen.     Left Ear: Tympanic membrane, ear canal and external ear normal. There is no impacted cerumen.  Eyes:     General:        Right eye: No discharge.        Left eye: No discharge.     Extraocular Movements: Extraocular movements intact.     Conjunctiva/sclera: Conjunctivae normal.     Pupils: Pupils are equal, round, and reactive to light.  Neck:     Thyroid: No thyroid mass or thyromegaly.  Cardiovascular:     Rate and Rhythm: Normal rate and regular rhythm.     Pulses: Normal pulses.     Heart sounds: Normal heart sounds. No murmur heard. Pulmonary:     Effort: Pulmonary effort is normal. No respiratory  distress.     Breath sounds: Normal breath sounds. No wheezing, rhonchi or rales.  Abdominal:     General: Bowel sounds are normal. There is no distension.     Palpations: Abdomen is soft. There is no mass.     Tenderness: There is no abdominal tenderness. There is no guarding or rebound.     Hernia: No hernia is present.  Musculoskeletal:     Cervical back: Normal range of motion and neck supple. No rigidity.     Right lower leg: No edema.     Left lower leg: No edema.  Lymphadenopathy:     Cervical: No cervical adenopathy.  Skin:    General: Skin is warm and dry.     Findings: No rash.  Neurological:     General: No focal deficit present.     Mental Status: She is alert. Mental status is at baseline.     Comments:  Recall 3/3 Calculation 5/5 DLROW  Psychiatric:        Mood and Affect: Mood normal.        Behavior: Behavior normal.      Results for orders placed or performed in visit on 09/01/20  CBC with Differential/Platelet  Result Value Ref Range   WBC 6.1 4.0 - 10.5 K/uL   RBC 4.57 3.87 - 5.11 Mil/uL   Hemoglobin 13.4 12.0 - 15.0 g/dL   HCT 39.7 36.0 - 46.0 %   MCV 86.8 78.0 - 100.0 fl   MCHC 33.7 30.0 - 36.0 g/dL   RDW 13.7 11.5 - 15.5 %   Platelets 240.0 150.0 - 400.0 K/uL   Neutrophils Relative % 57.0 43.0 - 77.0 %   Lymphocytes Relative 26.8 12.0 - 46.0 %   Monocytes Relative 11.2 3.0 - 12.0 %   Eosinophils Relative 4.5 0.0 - 5.0 %   Basophils Relative 0.5 0.0 - 3.0 %   Neutro Abs 3.5 1.4 - 7.7 K/uL   Lymphs Abs 1.6 0.7 - 4.0 K/uL   Monocytes Absolute 0.7 0.1 - 1.0 K/uL  Eosinophils Absolute 0.3 0.0 - 0.7 K/uL   Basophils Absolute 0.0 0.0 - 0.1 K/uL  VITAMIN D 25 Hydroxy (Vit-D Deficiency, Fractures)  Result Value Ref Range   VITD 60.76 30.00 - 100.00 ng/mL  TSH  Result Value Ref Range   TSH 3.41 0.35 - 4.50 uIU/mL  Comprehensive metabolic panel  Result Value Ref Range   Sodium 140 135 - 145 mEq/L   Potassium 4.7 3.5 - 5.1 mEq/L   Chloride 101 96 -  112 mEq/L   CO2 29 19 - 32 mEq/L   Glucose, Bld 93 70 - 99 mg/dL   BUN 21 6 - 23 mg/dL   Creatinine, Ser 0.86 0.40 - 1.20 mg/dL   Total Bilirubin 0.4 0.2 - 1.2 mg/dL   Alkaline Phosphatase 111 39 - 117 U/L   AST 24 0 - 37 U/L   ALT 21 0 - 35 U/L   Total Protein 7.1 6.0 - 8.3 g/dL   Albumin 4.6 3.5 - 5.2 g/dL   GFR 64.19 >60.00 mL/min   Calcium 10.4 8.4 - 10.5 mg/dL  Lipid panel  Result Value Ref Range   Cholesterol 213 (H) 0 - 200 mg/dL   Triglycerides 120.0 0.0 - 149.0 mg/dL   HDL 58.50 >39.00 mg/dL   VLDL 24.0 0.0 - 40.0 mg/dL   LDL Cholesterol 130 (H) 0 - 99 mg/dL   Total CHOL/HDL Ratio 4    NonHDL 154.48    Depression screen [facility] 2/9 09/08/2020 08/29/2019 08/15/2018 08/09/2017 08/05/2016  Decreased Interest 0 0 0 0 2  Down, Depressed, Hopeless 1 0 0 0 1  PHQ - 2 Score 1 0 0 0 3  Altered sleeping 2 0 0 0 1  Tired, decreased energy 3 0 0 0 3  Change in appetite 0 0 0 0 3  Feeling bad or failure about yourself  0 0 0 0 0  Trouble concentrating 3 0 0 0 2  Moving slowly or fidgety/restless 0 0 0 0 0  Suicidal thoughts 0 0 0 0 0  PHQ-9 Score 9 0 0 0 12  Difficult doing work/chores - Not difficult at all Not difficult at all Not difficult at all Not difficult at all    GAD 7 : Generalized Anxiety Score 09/08/2020  Nervous, Anxious, on Edge 2  Control/stop worrying 1  Worry too much - different things 3  Trouble relaxing 2  Restless 0  Easily annoyed or irritable 3  Afraid - awful might happen 3  Total GAD 7 Score 14   Assessment & Plan:  This visit occurred during the SARS-CoV-2 public health emergency.  Safety protocols were in place, including screening questions prior to the visit, additional usage of staff PPE, and extensive cleaning of exam room while observing appropriate contact time as indicated for disinfecting solutions.   Problem List Items Addressed This Visit     Medicare annual wellness visit, subsequent - Primary (Chronic)    I have personally reviewed the  Medicare Annual Wellness questionnaire and have noted 1. The patient's medical and social history 2. Their use of alcohol, tobacco or illicit drugs 3. Their current medications and supplements 4. The patient's functional ability including ADL's, fall risks, home safety risks and hearing or visual impairment. Cognitive function has been assessed and addressed as indicated.  5. Diet and physical activity 6. Evidence for depression or mood disorders The patients weight, height, BMI have been recorded in the chart. I have made referrals, counseling and provided education to the patient  based on review of the above and I have provided the pt with a written personalized care plan for preventive services. Provider list updated.. See scanned questionairre as needed for further documentation. Reviewed preventative protocols and updated unless pt declined.        Health maintenance examination (Chronic)    Preventative protocols reviewed and updated unless pt declined. Discussed healthy diet and lifestyle.        MDD (major depressive disorder), recurrent episode, moderate (HCC)    Chronic, stable on daily 37.5mg  sertraline - continue.        Relevant Medications   sertraline (ZOLOFT) 25 MG tablet   Osteopenia    Reviewed with patient - will order updated DEXA.  Would be eligible for treatment due to elevated hip fracture risk.        Relevant Orders   DG Bone Density   HLD (hyperlipidemia)    Chronic, continue zetia and niacin. Statin intolerance. The 10-year ASCVD risk score Mikey Bussing DC Brooke Bonito., et al., 2013) is: 19.2%   Values used to calculate the score:     Age: 89 years     Sex: Female     Is Non-Hispanic African American: No     Diabetic: No     Tobacco smoker: No     Systolic Blood Pressure: 628 mmHg     Is BP treated: No     HDL Cholesterol: 58.5 mg/dL     Total Cholesterol: 213 mg/dL        Vitamin D deficiency    Continue high dose replacement.        Elevated  alkaline phosphatase level    Has normalized.        Refusal of blood transfusions as patient is Jehovah's Witness   Memory deficit    continues aricept 5mg  daily, tolerating well. However Courtney notices progressive difficulty with memory which is concerning.  Will refer for neurocognitive evaluation.       Relevant Orders   Ambulatory referral to Neurology   Exertional dyspnea    S/p cardiac evaluation. Some better since hctz started - continue this.        Leg cramping    Ongoing difficulty. Possibly worse with hctz. Continue OTC potassium.        Subclinical hypothyroidism    TSH stable on low dose levothyroxine.          Meds ordered this encounter  Medications   sertraline (ZOLOFT) 25 MG tablet    Sig: Take 1.5 tablets (37.5 mg total) by mouth at bedtime.    Dispense:  135 tablet    Refill:  3    Note new dose    Orders Placed This Encounter  Procedures   DG Bone Density    Standing Status:   Future    Standing Expiration Date:   09/08/2021    Order Specific Question:   Reason for Exam (SYMPTOM  OR DIAGNOSIS REQUIRED)    Answer:   osteopenia f/u    Order Specific Question:   Preferred imaging location?    Answer:   [facility]   Ambulatory referral to Neurology    Referral Priority:   Routine    Referral Type:   Consultation    Referral Reason:   Specialty Services Required    Requested Specialty:   Neurology    Number of Visits Requested:   1    Patient instructions: You are doing well today  Return as needed or in 6 months for  follow up visit.  We will refer you for neurocognitive testing in [place].  4 core lifestyle modifications to support a healthy mind:  1. Nutritious well balance diet.  2. Regular physical activity routine.  3. Regular mental activity such as reading books, word puzzles, math puzzles, jigsaw puzzles.  4. Social engagement.  Also ensure good blood pressure control, limit alcohol, no smoking.   Follow up plan: Return  in about 6 months (around 03/11/2021) for follow up visit.  [name], MD

## 2020-09-08 NOTE — Assessment & Plan Note (Signed)
Chronic, continue zetia and niacin. Statin intolerance. The 10-year ASCVD risk score Mikey Bussing DC Brooke Bonito., et al., 2013) is: 19.2%   Values used to calculate the score:     Age: 80 years     Sex: Female     Is Non-Hispanic African American: No     Diabetic: No     Tobacco smoker: No     Systolic Blood Pressure: 734 mmHg     Is BP treated: No     HDL Cholesterol: 58.5 mg/dL     Total Cholesterol: 213 mg/dL

## 2020-09-08 NOTE — Patient Instructions (Addendum)
You are doing well today  Return as needed or in 6 months for follow up visit.  We will refer you for neurocognitive testing in [place].  4 core lifestyle modifications to support a healthy mind:  1. Nutritious well balance diet.  2. Regular physical activity routine.  3. Regular mental activity such as reading books, word puzzles, math puzzles, jigsaw puzzles.  4. Social engagement.  Also ensure good blood pressure control, limit alcohol, no smoking.   Health Maintenance After Age 53 After age 12, you are at a higher risk for certain long-term diseases and infections as well as injuries from falls. Falls are a major cause of broken bones and head injuries in people who are older than age 58. Getting regular preventive care can help to keep you healthy and well. Preventive care includes getting regular testing and making lifestyle changes as recommended by your health care provider. Talk with your health care provider about: Which screenings and tests you should have. A screening is a test that checks for a disease when you have no symptoms. A diet and exercise plan that is right for you. What should I know about screenings and tests to prevent falls? Screening and testing are the best ways to find a health problem early. Early diagnosis and treatment give you the best chance of managing medical conditions that are common after age 75. Certain conditions and lifestyle choices may make you more likely to have a fall. Your health care provider may recommend: Regular vision checks. Poor vision and conditions such as cataracts can make you more likely to have a fall. If you wear glasses, make sure to get your prescription updated if your vision changes. Medicine review. Work with your health care provider to regularly review all of the medicines you are taking, including over-the-counter medicines. Ask your health care provider about any side effects that may make you more likely to have a fall. Tell  your health care provider if any medicines that you take make you feel dizzy or sleepy. Osteoporosis screening. Osteoporosis is a condition that causes the bones to get weaker. This can make the bones weak and cause them to break more easily. Blood pressure screening. Blood pressure changes and medicines to control blood pressure can make you feel dizzy. Strength and balance checks. Your health care provider may recommend certain tests to check your strength and balance while standing, walking, or changing positions. Foot health exam. Foot pain and numbness, as well as not wearing proper footwear, can make you more likely to have a fall. Depression screening. You may be more likely to have a fall if you have a fear of falling, feel emotionally low, or feel unable to do activities that you used to do. Alcohol use screening. Using too much alcohol can affect your balance and may make you more likely to have a fall. What actions can I take to lower my risk of falls? General instructions Talk with your health care provider about your risks for falling. Tell your health care provider if: You fall. Be sure to tell your health care provider about all falls, even ones that seem minor. You feel dizzy, sleepy, or off-balance. Take over-the-counter and prescription medicines only as told by your health care provider. These include any supplements. Eat a healthy diet and maintain a healthy weight. A healthy diet includes low-fat dairy products, low-fat (lean) meats, and fiber from whole grains, beans, and lots of fruits and vegetables. Home safety Remove any tripping hazards,  such as rugs, cords, and clutter. Install safety equipment such as grab bars in bathrooms and safety rails on stairs. Keep rooms and walkways well-lit. Activity  Follow a regular exercise program to stay fit. This will help you maintain your balance. Ask your health care provider what types of exercise are appropriate for you. If you  need a cane or walker, use it as recommended by your health care provider. Wear supportive shoes that have nonskid soles.  Lifestyle Do not drink alcohol if your health care provider tells you not to drink. If you drink alcohol, limit how much you have: 0-1 drink a day for women. 0-2 drinks a day for men. Be aware of how much alcohol is in your drink. In the [place] one drink equals one typical bottle of beer (12 oz), one-half glass of wine (5 oz), or one shot of hard liquor (1 oz). Do not use any products that contain nicotine or tobacco, such as cigarettes and e-cigarettes. If you need help quitting, ask your health care provider. Summary Having a healthy lifestyle and getting preventive care can help to protect your health and wellness after age 63. Screening and testing are the best way to find a health problem early and help you avoid having a fall. Early diagnosis and treatment give you the best chance for managing medical conditions that are more common for people who are older than age 9. Falls are a major cause of broken bones and head injuries in people who are older than age 20. Take precautions to prevent a fall at home. Work with your health care provider to learn what changes you can make to improve your health and wellness and to prevent falls. This information is not intended to replace advice given to you by your health care provider. Make sure you discuss any questions you have with your healthcare provider. Document Revised: 02/07/2020 Document Reviewed: 02/07/2020 Elsevier Patient Education  2022 [employer].

## 2020-09-08 NOTE — Assessment & Plan Note (Signed)
continues aricept 5mg  daily, tolerating well. However Kristin Kramer notices progressive difficulty with memory which is concerning.  Will refer for neurocognitive evaluation.

## 2020-09-08 NOTE — Assessment & Plan Note (Signed)
Preventative protocols reviewed and updated unless pt declined. Discussed healthy diet and lifestyle.  

## 2020-09-08 NOTE — Assessment & Plan Note (Signed)
Continue high dose replacement.

## 2020-09-10 DIAGNOSIS — Z96641 Presence of right artificial hip joint: Secondary | ICD-10-CM | POA: Diagnosis not present

## 2020-09-14 ENCOUNTER — Other Ambulatory Visit: Payer: Self-pay

## 2020-09-14 ENCOUNTER — Ambulatory Visit
Admission: RE | Admit: 2020-09-14 | Discharge: 2020-09-14 | Disposition: A | Discharge status: Home / Self Care | Payer: Medicare Other | Source: Ambulatory Visit | Attending: Family Medicine | Admitting: Family Medicine

## 2020-09-14 DIAGNOSIS — R928 Other abnormal and inconclusive findings on diagnostic imaging of breast: Secondary | ICD-10-CM

## 2020-09-14 DIAGNOSIS — N6001 Solitary cyst of right breast: Secondary | ICD-10-CM | POA: Diagnosis not present

## 2020-09-14 DIAGNOSIS — R922 Inconclusive mammogram: Secondary | ICD-10-CM | POA: Diagnosis not present

## 2020-09-17 ENCOUNTER — Other Ambulatory Visit: Payer: Medicare Other

## 2020-09-18 ENCOUNTER — Ambulatory Visit: Payer: Medicare Other

## 2020-11-11 ENCOUNTER — Telehealth: Payer: Self-pay

## 2020-11-11 NOTE — Chronic Care Management (AMB) (Addendum)
    Chronic Care Management Pharmacy Assistant   Name: Kristin Kramer  MRN: [id number] DOB: 02-09-1941  Reason for Encounter: CCM Reminder Call   Conditions to be addressed/monitored: HLD   Medications: Outpatient Encounter Medications as of 11/11/2020  Medication Sig   aspirin EC 81 MG tablet Take 81 mg by mouth daily. Swallow whole.   calcium carbonate (OSCAL) 1500 (600 Ca) MG TABS tablet Take 600 mg of elemental calcium by mouth daily with breakfast.   Cholecalciferol (VITAMIN D-3) 5000 units TABS Take 5,000 Units by mouth daily.    Coenzyme Q10 (COQ10 PO) Take 1 capsule by mouth daily.    donepezil (ARICEPT) 5 MG tablet TAKE 1 TABLET BY MOUTH EVERY DAY   ezetimibe (ZETIA) 10 MG tablet TAKE 1 TABLET BY MOUTH EVERY DAY   fexofenadine (ALLEGRA ALLERGY) 180 MG tablet Take 1 tablet (180 mg total) by mouth daily.   hydrochlorothiazide (MICROZIDE) 12.5 MG capsule Take 1 capsule (12.5 mg total) by mouth daily.   ipratropium (ATROVENT) 0.03 % nasal spray PLACE 2 SPRAYS INTO BOTH NOSTRILS EVERY 12 (TWELVE) HOURS.   levothyroxine (SYNTHROID) 25 MCG tablet TAKE 1 TABLET BY MOUTH EVERY DAY BEFORE BREAKFAST   losartan (COZAAR) 25 MG tablet Take 1 tablet (25 mg total) by mouth daily. (Patient not taking: Reported on 06/09/2020)   METAMUCIL FIBER PO Take by mouth daily.   niacinamide 500 MG tablet Take 1 tablet (500 mg total) by mouth daily.   omeprazole (PRILOSEC) 40 MG capsule TAKE 1 CAPSULE (40 MG TOTAL) BY MOUTH DAILY. FOR 2-3 WEEKS THEN AS NEEDED FOR HEARTBURN   Potassium 99 MG TABS Take 1 tablet (99 mg total) by mouth daily.   prednisoLONE acetate (PRED FORTE) 1 % ophthalmic suspension PLEASE SEE ATTACHED FOR DETAILED DIRECTIONS   Probiotic Product (PROBIOTIC PO) Take 1 capsule by mouth daily.    sertraline (ZOLOFT) 25 MG tablet Take 1.5 tablets (37.5 mg total) by mouth at bedtime.   TURMERIC PO Take 900 mg by mouth daily.    No facility-administered encounter medications on file as of  11/11/2020.   Kristin Kramer did not answer the phone to remind her of her upcoming telephone visit with [name] on 11/17/2020 at 9:00am. Patient was reminded to have all medications, supplements and blood pressure readings available for review at appointment. Left voicemail.  Star Rating Drugs: Medication:  Last Fill: Day Supply Losartan '25mg'$  09/04/20  [place], CPP notified  Avel Sensor, [place] Assistant [phone]  I have reviewed the care management and care coordination activities outlined in this encounter and I am certifying that I agree with the content of this note. No further action required.  [name], PharmD Clinical Pharmacist [facility] at [facility] [phone]

## 2020-11-17 ENCOUNTER — Telehealth: Payer: Medicare Other

## 2020-11-17 NOTE — Progress Notes (Deleted)
Chronic Care Management Pharmacy Note  11/17/2020 Name:  Kristin Kramer MRN:  [id number] DOB:  1941-02-23  Summary: ***  Recommendations/Changes made from today's visit: ***  Plan: ***   Subjective: Kristin Kramer is an 80 y.o. year old female who is a primary patient of Kristin Bush, MD.  The CCM team was consulted for assistance with disease management and care coordination needs.    {CCMTELEPHONEFACETOFACE:21091510} for {CCMINITIALFOLLOWUPCHOICE:21091511} in response to provider referral for pharmacy case management and/or care coordination services.   Consent to Services:  {CCMCONSENTOPTIONS:25074}  Patient Care Team: Kristin Bush, MD as PCP - General (Family Medicine) Kristin Kramer, Kristin Kramer as Consulting Physician (Optometry) Kristin Kramer, [facility] as Pharmacist (Pharmacist)  Recent office visits: 09/08/20 - PCP - Pt presented for AWV. Pt increased sertraline to 37.5 mg daily and this has helped. Leg cramps, continue OTC potassium. Health maintenance, order DEXA. Pt concerned about memory, refer for neurocognitive eval. Follow up 6 months.   Recent consult visits: 06/09/20- Dr. Lucio Edward- Kristin Kramer- Endoscopic procedure 06/02/20- Dr. Ida Kramer- Cardiology- Started hydrochlorothiazide 12.58m, losartan 25 mg. Pt states no longer taking famotidine 20 mg or ondansetron 8 mg.  Hospital visits: None in previous 6 months   Objective:  Lab Results  Component Value Date   CREATININE 0.86 09/01/2020   BUN 21 09/01/2020   GFR 64.19 09/01/2020   GFRNONAA >60 08/08/2019   GFRAA >60 08/08/2019   NA 140 09/01/2020   K 4.7 09/01/2020   CALCIUM 10.4 09/01/2020   CO2 29 09/01/2020   GLUCOSE 93 09/01/2020    Lab Results  Component Value Date/Time   HGBA1C 5.7 07/10/2019 11:18 AM   GFR 64.19 09/01/2020 08:14 AM   GFR 74.62 05/11/2020 02:36 PM    Lab Results  Component Value Date   CHOL 213 (H) 09/01/2020   HDL 58.50 09/01/2020   LDLCALC 130 (H)  09/01/2020   LDLDIRECT 146.4 08/13/2012   TRIG 120.0 09/01/2020   CHOLHDL 4 09/01/2020    Hepatic Function Latest Ref Rng & Units 09/01/2020 08/29/2019 07/29/2019  Total Protein 6.0 - 8.3 g/dL 7.1 7.3 7.7  Albumin 3.5 - 5.2 g/dL 4.6 4.5 4.4  AST 0 - 37 U/L 24 28 23   ALT 0 - 35 U/L 21 22 24   Alk Phosphatase 39 - 117 U/L 111 255(H) 110  Total Bilirubin 0.2 - 1.2 mg/dL 0.4 0.6 0.4    Lab Results  Component Value Date/Time   TSH 3.41 09/01/2020 08:14 AM   TSH 3.37 08/29/2019 08:10 AM   FREET4 1.12 08/29/2019 08:10 AM   FREET4 0.66 12/17/2018 08:13 AM    CBC Latest Ref Rng & Units 09/01/2020 08/29/2019 08/08/2019  WBC 4.0 - 10.5 K/uL 6.1 6.0 8.3  Hemoglobin 12.0 - 15.0 g/dL 13.4 12.5 10.4(L)  Hematocrit 36.0 - 46.0 % 39.7 37.9 32.7(L)  Platelets 150.0 - 400.0 K/uL 240.0 427.0(H) 185    Lab Results  Component Value Date/Time   VD25OH 60.76 09/01/2020 08:14 AM   VD25OH 40.80 08/29/2019 08:10 AM    Clinical ASCVD: No  The 10-year ASCVD risk score (Arnett DK, et al., 2019) is: 19.2%   Values used to calculate the score:     Age: 89years     Sex: Female     Is Non-Hispanic African American: No     Diabetic: No     Tobacco smoker: No     Systolic Blood Pressure: 1976mmHg     Is BP treated: No  HDL Cholesterol: 58.5 mg/dL     Total Cholesterol: 213 mg/dL    Depression screen [facility] 2/9 09/08/2020 08/29/2019 08/15/2018  Decreased Interest 0 0 0  Down, Depressed, Hopeless 1 0 0  PHQ - 2 Score 1 0 0  Altered sleeping 2 0 0  Tired, decreased energy 3 0 0  Change in appetite 0 0 0  Feeling bad or failure about yourself  0 0 0  Trouble concentrating 3 0 0  Moving slowly or fidgety/restless 0 0 0  Suicidal thoughts 0 0 0  PHQ-9 Score 9 0 0  Difficult doing work/chores - Not difficult at all Not difficult at all    Social History   Tobacco Use  Smoking Status Never  Smokeless Tobacco Never   BP Readings from Last 3 Encounters:  09/08/20 112/68  06/09/20 120/72  06/02/20  140/90   Pulse Readings from Last 3 Encounters:  09/08/20 84  06/09/20 70  06/02/20 64   Wt Readings from Last 3 Encounters:  09/08/20 147 lb 8 oz (66.9 kg)  06/09/20 155 lb 12.8 oz (70.7 kg)  06/02/20 155 lb (70.3 kg)   BMI Readings from Last 3 Encounters:  09/08/20 24.93 kg/m  06/09/20 25.93 kg/m  06/02/20 25.79 kg/m    Assessment/Interventions: Review of patient past medical history, allergies, medications, health status, including review of consultants reports, laboratory and other test data, was performed as part of comprehensive evaluation and provision of chronic care management services.   SDOH:  (Social Determinants of Health) assessments and interventions performed: Yes  SDOH Screenings   Alcohol Screen: Not on file  Depression (PHQ2-9): Medium Risk   PHQ-2 Score: 9  Financial Resource Strain: Not on file  Food Insecurity: Not on file  Housing: Not on file  Physical Activity: Not on file  Social Connections: Not on file  Stress: Not on file  Tobacco Use: Low Risk    Smoking Tobacco Use: Never   Smokeless Tobacco Use: Never  Transportation Needs: Not on file    [place]  Allergies  Allergen Reactions   Blood-Group Specific Substance    Lipitor [Atorvastatin] Other (See Comments)    Cramps   Pravastatin Other (See Comments)    Cramps     Medications Reviewed Today     Reviewed by Kristin Bush, MD (Physician) on 09/08/20 at [phone]  Med List Status: <None>   Medication Order Taking? Sig Documenting Provider Last Dose Status Informant  aspirin EC 81 MG tablet [id number] Yes Take 81 mg by mouth daily. Swallow whole. Kristin Bush, MD Taking Active            Med Note ([place]   Tue Jun 02, 2020  9:45 AM)    calcium carbonate (OSCAL) 1500 (600 Ca) MG TABS tablet [id number] Yes Take 600 mg of elemental calcium by mouth daily with breakfast. [provider] Taking Active Self  Cholecalciferol (VITAMIN D-3) 5000 units TABS  [id number] Yes Take 5,000 Units by mouth daily.  [provider] Taking Active Self  Coenzyme Q10 (COQ10 PO) [id number] Yes Take 1 capsule by mouth daily.  [provider] Taking Active Self  donepezil (ARICEPT) 5 MG tablet [id number] Yes TAKE 1 TABLET BY MOUTH EVERY DAY Kristin Bush, MD Taking Active   ezetimibe (ZETIA) 10 MG tablet [id number] Yes TAKE 1 TABLET BY MOUTH EVERY DAY Kristin Bush, MD Taking Active   fexofenadine [facility] ALLERGY) 180 MG tablet [id number] Yes Take 1 tablet (180 mg total) by mouth  daily. Kristin Bush, MD Taking Active   hydrochlorothiazide (MICROZIDE) 12.5 MG capsule [id number]  Take 1 capsule (12.5 mg total) by mouth daily. Minna Merritts, MD  Expired 08/31/20 2359   ipratropium (ATROVENT) 0.03 % nasal spray [id number] Yes PLACE 2 SPRAYS INTO BOTH NOSTRILS EVERY 12 (TWELVE) HOURS. Kristin Bush, MD Taking Active   levothyroxine (SYNTHROID) 25 MCG tablet [id number] Yes TAKE 1 TABLET BY MOUTH EVERY DAY BEFORE Sander Radon, MD Taking Active   losartan (COZAAR) 25 MG tablet [id number]  Take 1 tablet (25 mg total) by mouth daily.  Patient not taking: Reported on 06/09/2020   Minna Merritts, MD  Expired 08/31/20 2359   METAMUCIL FIBER PO [id number] Yes Take by mouth daily. [provider] Taking Active   niacinamide 500 MG tablet [id number] Yes Take 1 tablet (500 mg total) by mouth daily. Kristin Bush, MD Taking Active            Med Note Ronaldo Miyamoto   Tue Jun 02, 2020  9:46 AM)    omeprazole (PRILOSEC) 40 MG capsule [id number] Yes TAKE 1 CAPSULE (40 MG TOTAL) BY MOUTH DAILY. FOR 2-3 WEEKS THEN AS NEEDED FOR Estrella Myrtle, MD Taking Active   Potassium 99 MG TABS [id number] Yes Take 1 tablet (99 mg total) by mouth daily. Kristin Bush, MD Taking Active   prednisoLONE acetate (PRED FORTE) 1 % ophthalmic suspension [id number] Yes PLEASE SEE ATTACHED FOR DETAILED DIRECTIONS [provider]  Taking Active   Probiotic Product (PROBIOTIC PO) [id number] Yes Take 1 capsule by mouth daily.  [provider] Taking Active Self  sertraline (ZOLOFT) 25 MG tablet [id number] Yes ALTERNATE 1 TABLET AND 1.5 TABLETS EVERY OTHER DAY Kristin Bush, MD Taking Active   TURMERIC PO [id number] Yes Take 900 mg by mouth daily.  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   Situational stress 07/10/2019   Leg cramping 08/15/2018   Subclinical hypothyroidism 08/15/2018   TMJ tenderness, right 02/08/2018   Lactose intolerance 02/08/2018   Status post total hip replacement, right 12/07/2017   Closed intertrochanteric fracture with nonunion, right 11/28/2017   Chronic nasal congestion 04/19/2017   Exertional dyspnea 04/19/2017   Memory deficit 08/05/2016   Cervical neck pain with evidence of disc disease 08/05/2016   Pre-op evaluation 05/06/2016   Advanced care planning/counseling discussion 07/31/2014   Diarrhea 07/31/2014   Right rib fracture 05/21/2014   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   Post-operative nausea and vomiting    Refusal of blood transfusions as patient is Jehovah's Witness    Medicare annual wellness visit, subsequent 01/14/2013   Elevated alkaline phosphatase level 01/03/2013   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   Osteopenia    HLD (hyperlipidemia)    Vitamin D deficiency    Seasonal allergies    Squamous cell skin cancer    MDD (major depressive disorder), recurrent episode, moderate (HCC)    Migraines    Abnormal EKG     Immunization History   Administered Date(s) Administered   Influenza Split 12/01/2011   Influenza, High Dose Seasonal PF 02/10/2014,  11/27/2017, 11/28/2018, 11/26/2019, 11/10/2020   Influenza,inj,Quad PF,6+ Mos 01/14/2013, 12/29/2014, 02/04/2016   Influenza-Unspecified 01/03/2017   PFIZER Comirnaty(Gray Top)Covid-19 Tri-Sucrose Vaccine 07/14/2020   PFIZER(Purple Top)SARS-COV-2 Vaccination 04/15/2019, 05/06/2019, 12/31/2019   Pneumococcal Conjugate-13 07/31/2014   Pneumococcal Polysaccharide-23 12/01/2011   Tdap 10/22/2014   Zoster Recombinat (Shingrix) 11/29/2016, 10/21/2017   Zoster, Live 02/14/2013    Conditions to be addressed/monitored:  Hypertension and GERD  There are no care plans that you recently modified to display for this patient.  Current Barriers:  {pharmacybarriers:24917}  Pharmacist Clinical Goal(s):  Patient will {PHARMACYGOALCHOICES:24921} through collaboration with PharmD and provider.   Interventions: 1:1 collaboration with Kristin Bush, MD regarding development and update of comprehensive plan of care as evidenced by provider attestation and co-signature Inter-disciplinary care team collaboration (see longitudinal plan of care) Comprehensive medication review performed; medication list updated in electronic medical record  Hypertension (BP goal <140/90) -Controlled -Current treatment: *** -Medications previously tried: ***  -Current home readings: *** -Current dietary habits: *** -Current exercise habits: *** -{ACTIONS;DENIES/REPORTS:21021675::"Denies"} hypotensive/hypertensive symptoms -Educated on {CCM BP Counseling:25124} -Counseled to monitor BP at home ***, document, and provide log at future appointments -{CCMPHARMDINTERVENTION:25122}  GERD (Goal: ***) -{US controlled/uncontrolled:25276} -Current treatment  *** -Medications previously tried: ***  -{CCMPHARMDINTERVENTION:25122}  Patient Goals/Self-Care Activities Patient will:  -  {pharmacypatientgoals:24919}  Follow Up Plan: {CM FOLLOW UP FFMB:84665}   Medication Assistance: {MEDASSISTANCEINFO:25044}  Compliance/Adherence/Medication fill history: Care Gaps: ***  Star-Rating Drugs: Medication:                Last Fill:         Day Supply Losartan 65m            09/04/20              90  Patient's preferred pharmacy is:  [id number] [address]: [phone]: [phone] pill box? {Yes or If no, why not?:20788} Pt endorses ***% compliance  We discussed: {Pharmacy options:24294} Patient decided to: {US Pharmacy PlHLKT:62563}Care Plan and Follow Up Patient Decision:  {FOLLOWUP:24991}  Plan: {CM FOLLOW UP PLSLHT:34287}MiDebbora DusPharmD Clinical Pharmacist [phone]

## 2020-12-07 DIAGNOSIS — L570 Actinic keratosis: Secondary | ICD-10-CM | POA: Diagnosis not present

## 2020-12-07 DIAGNOSIS — Z85828 Personal history of other malignant neoplasm of skin: Secondary | ICD-10-CM | POA: Diagnosis not present

## 2020-12-18 ENCOUNTER — Telehealth: Payer: Self-pay

## 2020-12-18 NOTE — Progress Notes (Addendum)
Chronic Care Management Pharmacy Assistant   Name: Kristin Kramer  MRN: [id number] DOB: 1940/06/26  Reason for Encounter: CCM (General Adherence)   Recent office visits:  09/08/2020 - Kristin Bush, MD - Patient presented for Annual Wellness Visit. Referral to Neurology. DG Bone Density placed. Changed: sertraline (ZOLOFT) 25 MG tablet.  Recent consult visits:  09/03/2020 - Optometry - Patient presented for primary iridocyclitis, bilateral.  06/26/2020 - Dermatology - Patient presented for inflamed seborrheic keratosis, Other melanin hyperpigmentation, Hemangioma of skin and subcutaneous tissue, Melanocytic nevi of left lower limb, including hip, Other seborrheic keratosis, Other skin changes due to chronic exposure to nonionizing radiation, Actinic keratosis, Other rosacea. Procedures: PR DESTRUC BENIGN/PREMAL,2-14 LESIONS, PR DESTRUCTION BENIGN LESIONS UP TO 14 and PR DESTRUC PREMAL,FIRST LESION.  Hospital visits:  None in previous 6 months  Medications: Outpatient Encounter Medications as of 12/18/2020  Medication Sig   aspirin EC 81 MG tablet Take 81 mg by mouth daily. Swallow whole.   calcium carbonate (OSCAL) 1500 (600 Ca) MG TABS tablet Take 600 mg of elemental calcium by mouth daily with breakfast.   Cholecalciferol (VITAMIN D-3) 5000 units TABS Take 5,000 Units by mouth daily.    Coenzyme Q10 (COQ10 PO) Take 1 capsule by mouth daily.    donepezil (ARICEPT) 5 MG tablet TAKE 1 TABLET BY MOUTH EVERY DAY   ezetimibe (ZETIA) 10 MG tablet TAKE 1 TABLET BY MOUTH EVERY DAY   fexofenadine (ALLEGRA ALLERGY) 180 MG tablet Take 1 tablet (180 mg total) by mouth daily.   hydrochlorothiazide (MICROZIDE) 12.5 MG capsule Take 1 capsule (12.5 mg total) by mouth daily.   ipratropium (ATROVENT) 0.03 % nasal spray PLACE 2 SPRAYS INTO BOTH NOSTRILS EVERY 12 (TWELVE) HOURS.   levothyroxine (SYNTHROID) 25 MCG tablet TAKE 1 TABLET BY MOUTH EVERY DAY BEFORE BREAKFAST   losartan (COZAAR) 25  MG tablet Take 1 tablet (25 mg total) by mouth daily. (Patient not taking: Reported on 06/09/2020)   METAMUCIL FIBER PO Take by mouth daily.   niacinamide 500 MG tablet Take 1 tablet (500 mg total) by mouth daily.   omeprazole (PRILOSEC) 40 MG capsule TAKE 1 CAPSULE (40 MG TOTAL) BY MOUTH DAILY. FOR 2-3 WEEKS THEN AS NEEDED FOR HEARTBURN   Potassium 99 MG TABS Take 1 tablet (99 mg total) by mouth daily.   prednisoLONE acetate (PRED FORTE) 1 % ophthalmic suspension PLEASE SEE ATTACHED FOR DETAILED DIRECTIONS   Probiotic Product (PROBIOTIC PO) Take 1 capsule by mouth daily.    sertraline (ZOLOFT) 25 MG tablet Take 1.5 tablets (37.5 mg total) by mouth at bedtime.   TURMERIC PO Take 900 mg by mouth daily.    No facility-administered encounter medications on file as of 12/18/2020.   Contacted Kristin Kramer on 12/18/2020 for general disease state and medication adherence call.   Patient is not > 5 days past due for refill on the following medications per chart history:  Star Medications: Medication Name/mg Last Fill Days Supply Losartan 25mg   11/26/2020 90   What concerns do you have about your medications? No  The patient denies side effects with her medications.   How often do you forget or accidentally miss a dose? Never  Do you use a pillbox? Yes  Are you having any problems getting your medications from your pharmacy? No  Has the cost of your medications been a concern? No  Since last visit with CPP, no interventions have been made:   The patient has not had an ED  visit since last contact.   The patient denies problems with their health.   she denies  concerns or questions for Kristin Kramer, Pharm. D at this time.   Counseled patient on:  Great job taking medications  Care Gaps: Annual wellness visit in last year? Yes Most Recent BP reading: 112/68 on 09/08/2020  Neuropsych appointments on: 12/23/2020 12/30/2020  Kristin Kramer, CPP notified  Kristin Kramer,  [place] Assistant [phone]  I have reviewed the care management and care coordination activities outlined in this encounter and I am certifying that I agree with the content of this note. No further action required.  Kristin Kramer, PharmD Clinical Pharmacist [facility] at [facility] [phone]

## 2020-12-23 ENCOUNTER — Encounter: Payer: Self-pay | Admitting: Psychology

## 2020-12-23 ENCOUNTER — Ambulatory Visit: Payer: Medicare Other

## 2020-12-23 ENCOUNTER — Ambulatory Visit (INDEPENDENT_AMBULATORY_CARE_PROVIDER_SITE_OTHER): Payer: Medicare Other | Admitting: Psychology

## 2020-12-23 ENCOUNTER — Other Ambulatory Visit: Payer: Self-pay

## 2020-12-23 DIAGNOSIS — G3184 Mild cognitive impairment, so stated: Secondary | ICD-10-CM | POA: Insufficient documentation

## 2020-12-23 DIAGNOSIS — F330 Major depressive disorder, recurrent, mild: Secondary | ICD-10-CM

## 2020-12-23 DIAGNOSIS — R7303 Prediabetes: Secondary | ICD-10-CM | POA: Insufficient documentation

## 2020-12-23 DIAGNOSIS — F411 Generalized anxiety disorder: Secondary | ICD-10-CM | POA: Insufficient documentation

## 2020-12-23 DIAGNOSIS — R4189 Other symptoms and signs involving cognitive functions and awareness: Secondary | ICD-10-CM

## 2020-12-23 HISTORY — DX: Mild cognitive impairment of uncertain or unknown etiology: G31.84

## 2020-12-23 NOTE — Progress Notes (Signed)
   Psychometrician Note   Cognitive testing was administered to [name] by Cruzita Lederer, B.S. (psychometrist) under the supervision of Dr. Christia Reading, Ph.D., licensed psychologist on 12/23/2020. Ms. Potteiger did not appear overtly distressed by the testing session per behavioral observation or responses across self-report questionnaires. Rest breaks were offered.    The battery of tests administered was selected by Dr. Christia Reading, Ph.D. with consideration to Ms. Opiela's current level of functioning, the nature of her symptoms, emotional and behavioral responses during interview, level of literacy, observed level of motivation/effort, and the nature of the referral question. This battery was communicated to the psychometrist. Communication between Dr. Christia Reading, Ph.D. and the psychometrist was ongoing throughout the evaluation and Dr. Christia Reading, Ph.D. was immediately accessible at all times. Dr. Christia Reading, Ph.D. provided supervision to the psychometrist on the date of this service to the extent necessary to assure the quality of all services provided.    [name] will return within approximately 1-2 weeks for an interactive feedback session with Dr. Melvyn Novas at which time her test performances, clinical impressions, and treatment recommendations will be reviewed in detail. Ms. Blanchard understands she can contact our office should she require our assistance before this time.  A total of 120 minutes of billable time were spent face-to-face with Ms. Klonowski by the psychometrist. This includes both test administration and scoring time. Billing for these services is reflected in the clinical report generated by Dr. Christia Reading, Ph.D.  This note reflects time spent with the psychometrician and does not include test scores or any clinical interpretations made by Dr. Melvyn Novas. The full report will follow in a separate note.

## 2020-12-23 NOTE — Progress Notes (Signed)
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 delayed recall (i.e., retrieval) of previously learned information was average. Retention rates were 100% across a story learning task, 86% across a list learning task, and 53% across a figure drawing task. Performance across recognition tasks was average, suggesting evidence for information consolidation.   Results of emotional screening instruments suggested that recent symptoms of generalized anxiety were in the severe range, while symptoms of depression were within the mild range. A screening instrument assessing recent sleep quality suggested the presence of minimal sleep dysfunction.  Tables of Scores:   Note: This summary of test scores accompanies the interpretive report and should not be considered in isolation without reference to the appropriate sections in the text. Descriptors are based on appropriate normative data and may be adjusted based on clinical judgment. Terms such as "Within Normal Limits" and "Outside Normal Limits" are used when a more specific description of the test score cannot be determined.       Percentile - Normative Descriptor > 98 - Exceptionally High 91-97  - Well Above Average 75-90 - Above Average 25-74 - Average 9-24 - Below Average 2-8 - Well Below Average < 2 - Exceptionally Low       Validity:   DESCRIPTOR       Dot Counting Test: --- --- Within Normal Limits  RBANS Effort Index: --- --- Within Normal Limits  WAIS-IV Reliable Digit Span: --- --- Within Normal Limits  D-KEFS Color Word Effort Index: --- --- Within Normal Limits       Orientation:      Raw Score Percentile   NAB Orientation, Form 1 29/29 --- ---       Cognitive Screening:      Raw Score Percentile   SLUMS: 21/30 --- ---       RBANS, Form A: Standard Score/ Scaled Score Percentile   Total Score 90 25 Average  Immediate Memory 73 4 Well Below Average    List Learning 5 5 Well Below Average    Story Memory 5 5 Well Below Average  Visuospatial/Constructional 96  39 Average    Figure Copy 9 37 Average    Line Orientation 17/20 51-75 Average  Language 105 63 Average    Picture Naming 10/10 51-75 Average    Semantic Fluency 11 63 Average  Attention 94 34 Average    Digit Span 8 25 Average    Coding 10 50 Average  Delayed Memory 98 45 Average    List Recall 6/10 51-75 Average    List Recognition 19/20 26-50 Average    Story Recall 8 25 Average    Story Recognition 10/12 27-46 Average    Figure Recall 8 25 Average    Figure Recognition 6/8 30-52 Average       Intellectual Functioning:      Standard Score Percentile   Test of Premorbid Functioning: 96 39 Average       Attention/Executive Function:     Trail Making Test (TMT): Raw Score (Scaled Score) Percentile     Part A 43 secs.,  2 errors (9) 37 Average    Part B 146 secs.,  0 errors (8) 25 Average  *Based on [employer] Older Normative Studies (MOANS)           Scaled Score Percentile   WAIS-IV Digit Span: 8 25 Average    Forward 9 37 Average    Backward 9 37 Average    Sequencing 5 5 Well Below Average        Scaled Score Percentile   WAIS-IV Similarities: 10 50 Average       D-KEFS Color-Word  Interference Test: Raw Score (Scaled Score) Percentile     Color Naming 33 secs. (10) 50 Average    Word Reading 20 secs. (13) 84 Above Average    Inhibition 107 secs. (5) 5 Well Below Average      Total Errors 2 errors (11) 63 Average    Inhibition/Switching 81 secs. (10) 50 Average      Total Errors 1 error (12) 75 Above Average       NAB Executive Functions Module, Form 1: T Score Percentile     Judgment 46 34 Average       Language:     Verbal Fluency Test: Raw Score (Scaled Score) Percentile     Phonemic Fluency (CFL) 24 (8) 25 Average    Category Fluency 32 (9) 37 Average  *Based on [employer] Older Normative Studies (MOANS)          NAB Language Module, Form 1: T Score Percentile     Auditory Comprehension 58 79 Above Average    Naming 29/31 (50) 50 Average       Visuospatial/Visuoconstruction:      Raw Score Percentile   Clock Drawing: 9/10 --- Within Normal Limits       Mood and Personality:      Raw Score Percentile   Geriatric Depression Scale: 15 --- Mild  Geriatric Anxiety Scale: 34 --- Severe    Somatic 13 --- Severe    Cognitive 10 --- Severe    Affective 11 --- Severe       Additional Questionnaires:      Raw Score Percentile   PROMIS Sleep Disturbance Questionnaire: 21 --- None to Slight   Informed Consent and Coding/Compliance:   The current evaluation represents a clinical evaluation for the purposes previously outlined by the referral source and is in no way reflective of a forensic evaluation.   Ms. Osburn was provided with a verbal description of the nature and purpose of the present neuropsychological  evaluation. Also reviewed were the foreseeable risks and/or discomforts and benefits of the procedure, limits of confidentiality, and mandatory reporting requirements of this provider. The patient was given the opportunity to ask questions and receive answers about the evaluation. Oral consent to participate was provided by the patient.   This evaluation  was conducted by Christia Reading, Ph.D., licensed clinical neuropsychologist. Ms. Lorenzi completed a clinical interview with Dr. Melvyn Novas, billed as one unit [phone], and 120 minutes of cognitive testing and scoring, billed as one unit [phone] and three additional units [id number]. Psychometrist Cruzita Lederer, B.S., assisted Dr. Melvyn Novas with test administration and scoring procedures. As a separate and discrete service, Dr. Melvyn Novas spent a total of 160 minutes in interpretation and report writing billed as one unit [phone] and two units [id number].

## 2020-12-30 ENCOUNTER — Other Ambulatory Visit: Payer: Self-pay

## 2020-12-30 ENCOUNTER — Ambulatory Visit (INDEPENDENT_AMBULATORY_CARE_PROVIDER_SITE_OTHER): Payer: Medicare Other | Admitting: Psychology

## 2020-12-30 DIAGNOSIS — F411 Generalized anxiety disorder: Secondary | ICD-10-CM | POA: Diagnosis not present

## 2020-12-30 DIAGNOSIS — G3184 Mild cognitive impairment, so stated: Secondary | ICD-10-CM | POA: Diagnosis not present

## 2020-12-30 NOTE — Progress Notes (Signed)
   Neuropsychology Feedback Session Kristin Kramer. [facility] of [facility]  Reason for Referral:   Kristin Kramer is a 80 y.o. right-handed Caucasian female referred by  [name], M.D. , to characterize her current cognitive functioning and assist with diagnostic clarity and treatment planning in the context of subjective cognitive decline and family history of dementia due to Alzheimer's disease.   Feedback:   Kristin Kramer completed a comprehensive neuropsychological evaluation on 12/23/2020. Please refer to that encounter for the full report and recommendations. Briefly, results suggested an isolated impairment surrounding efficiently encoding (i.e., learning) novel verbal information. There was also mild performance variability across more complex attention/concentration. The etiology of dysfunction is unclear. However, across mood-related questionnaires, Kristin Kramer reported acute symptoms of mild depression and severe anxiety. There remains the potential that dysfunction across testing and in her day-to-day life is related to ongoing psychiatric distress. As I do not have access to any recent neuroimaging, I cannot comment on any possible vascular contributions to her current presentation. Memory performance combined with intact performances across other areas of cognitive functioning is not suggestive of Alzheimer's disease at the present time.  Kristin Kramer was unaccompanied during the current feedback appointment. Content of the current session focused on the results of her neuropsychological evaluation. Kristin Kramer was given the opportunity to ask questions and her questions were answered. She was encouraged to reach out should additional questions arise. A copy of her report was provided at the conclusion of the visit.      18 minutes were spent conducting the current feedback session with Kristin Kramer, billed as one unit [phone].

## 2020-12-31 ENCOUNTER — Encounter: Payer: Self-pay | Admitting: Family Medicine

## 2021-01-23 ENCOUNTER — Other Ambulatory Visit: Payer: Self-pay | Admitting: Family Medicine

## 2021-02-03 ENCOUNTER — Encounter: Payer: Self-pay | Admitting: Family Medicine

## 2021-02-03 ENCOUNTER — Ambulatory Visit (INDEPENDENT_AMBULATORY_CARE_PROVIDER_SITE_OTHER)
Admission: RE | Admit: 2021-02-03 | Discharge: 2021-02-03 | Disposition: A | Discharge status: Home / Self Care | Payer: Medicare Other | Source: Ambulatory Visit | Attending: Family Medicine | Admitting: Family Medicine

## 2021-02-03 ENCOUNTER — Ambulatory Visit (INDEPENDENT_AMBULATORY_CARE_PROVIDER_SITE_OTHER): Payer: Medicare Other | Admitting: Family Medicine

## 2021-02-03 ENCOUNTER — Other Ambulatory Visit: Payer: Self-pay

## 2021-02-03 VITALS — BP 120/76 | HR 94 | Temp 97.8°F | Ht 64.5 in | Wt 151.2 lb

## 2021-02-03 DIAGNOSIS — L570 Actinic keratosis: Secondary | ICD-10-CM | POA: Diagnosis not present

## 2021-02-03 DIAGNOSIS — L814 Other melanin hyperpigmentation: Secondary | ICD-10-CM | POA: Diagnosis not present

## 2021-02-03 DIAGNOSIS — D1801 Hemangioma of skin and subcutaneous tissue: Secondary | ICD-10-CM | POA: Diagnosis not present

## 2021-02-03 DIAGNOSIS — R0789 Other chest pain: Secondary | ICD-10-CM

## 2021-02-03 DIAGNOSIS — R0781 Pleurodynia: Secondary | ICD-10-CM | POA: Diagnosis not present

## 2021-02-03 DIAGNOSIS — L821 Other seborrheic keratosis: Secondary | ICD-10-CM | POA: Diagnosis not present

## 2021-02-03 DIAGNOSIS — Z85828 Personal history of other malignant neoplasm of skin: Secondary | ICD-10-CM | POA: Diagnosis not present

## 2021-02-03 DIAGNOSIS — C44311 Basal cell carcinoma of skin of nose: Secondary | ICD-10-CM | POA: Diagnosis not present

## 2021-02-03 DIAGNOSIS — C44722 Squamous cell carcinoma of skin of right lower limb, including hip: Secondary | ICD-10-CM | POA: Diagnosis not present

## 2021-02-03 DIAGNOSIS — S2242XA Multiple fractures of ribs, left side, initial encounter for closed fracture: Secondary | ICD-10-CM | POA: Diagnosis not present

## 2021-02-03 DIAGNOSIS — D0462 Carcinoma in situ of skin of left upper limb, including shoulder: Secondary | ICD-10-CM | POA: Diagnosis not present

## 2021-02-03 DIAGNOSIS — J90 Pleural effusion, not elsewhere classified: Secondary | ICD-10-CM | POA: Diagnosis not present

## 2021-02-03 MED ORDER — TRAMADOL HCL 50 MG PO TABS: 50.0000 mg | ORAL_TABLET | Freq: Three times a day (TID) | ORAL | 0 refills | Status: DC | PRN

## 2021-02-03 NOTE — Progress Notes (Signed)
Kristin Kramer T. Thedora Rings, MD, [place] [facility] [place], [address]  Phone: [phone]  FAX: [place] - 80 y.o. female  MRN [id number]  Date of Birth: 10-May-1940  Date: 02/03/2021  PCP: [name], MD  Referral: [name], MD  Chief Complaint  Patient presents with   Fall    Last Friday   Left Side Pain    Hit Left Side on Bench when she fell last week    This visit occurred during the SARS-CoV-2 public health emergency.  Safety protocols were in place, including screening questions prior to the visit, additional usage of staff PPE, and extensive cleaning of exam room while observing appropriate contact time as indicated for disinfecting solutions.   Subjective:   Kristin Kramer is a 80 y.o. very pleasant female patient with Body mass index is 25.56 kg/m. who presents with the following:  Last Friday, she was out of town at [employer].  There was a bench she was trying to sit on the top of the bench, and she fell and struck the bench itself and hit the left side of her rib cage.  Since then, she has been having some lateral side and anterior chest wall pain on the left side.  She has been eating and drinking okay, and she does not have diffuse abdominal pain.  She otherwise is a very healthy young lady at 71, and she takes some blood pressure and cholesterol medicine, but aside from this she is healthy.  Review of Systems is noted in the HPI, as appropriate   Objective:   BP 120/76   Pulse 94   Temp 97.8 F (36.6 C) (Temporal)   Ht 5' 4.5" (1.638 m)   Wt 151 lb 4 oz (68.6 kg)   SpO2 99%   BMI 25.56 kg/m   GEN: No acute distress; alert,appropriate. PULM: Breathing comfortably in no respiratory distress PSYCH: Normally interactive.    This portion of the physical examination was chaperoned by Hedy Camara, CMA.  ABD: S, NT, ND, + BS, No rebound, No HSM  She  is tender directly on the sternum in the midpoint. The entire right upper extremity as well as the right thorax and ribs are nontender. Nontender on the left side along the clavicle, scapula, humerus as well as the distal upper extremity.  She does have some notable tenderness throughout multiple ribs on on the left side laterally and anteriorly with compression.  Radiology: No results found.  Assessment and Plan:     ICD-10-CM   1. Rib pain on left side  R07.81 DG Ribs Unilateral Left    2. Sternum pain  R07.89 DG Sternum     Clinically, suspect that she has broken likely more than 1 rib.  Notable pain.  She also has some sternal pain, so hope she has not had a sternal fracture.  Sternal fractures generally take a long time to heal and if that is the case she may have pain for several months.  Landa activities, I think continuing with Tylenol, ibuprofen, and tramadol for pain management.  If she continues to have severe pain, then short course of some stronger opioids would also be reasonable and appropriate.  Meds ordered this encounter  Medications   traMADol (ULTRAM) 50 MG tablet    Sig: Take 1 tablet (50 mg total) by mouth every 8 (eight) hours as needed for moderate pain.  Dispense:  20 tablet    Refill:  0   There are no discontinued medications. Orders Placed This Encounter  Procedures   DG Ribs Unilateral Left   DG Sternum    Follow-up: No follow-ups on file.  Dragon [employer] One speech-to-text software was used for transcription in this dictation.  Possible transcriptional errors can occur using Editor, commissioning.   Signed,  Maud Deed. Fateh Kindle, MD   Outpatient Encounter Medications as of 02/03/2021  Medication Sig   aspirin EC 81 MG tablet Take 81 mg by mouth daily. Swallow whole.   calcium carbonate (OSCAL) 1500 (600 Ca) MG TABS tablet Take 600 mg of elemental calcium by mouth daily with breakfast.   Cholecalciferol (VITAMIN D-3) 5000 units TABS Take 5,000  Units by mouth daily.    Coenzyme Q10 (COQ10 PO) Take 1 capsule by mouth daily.    donepezil (ARICEPT) 5 MG tablet TAKE 1 TABLET BY MOUTH EVERY DAY   ezetimibe (ZETIA) 10 MG tablet TAKE 1 TABLET BY MOUTH EVERY DAY   fexofenadine (ALLEGRA ALLERGY) 180 MG tablet Take 1 tablet (180 mg total) by mouth daily.   hydrochlorothiazide (MICROZIDE) 12.5 MG capsule Take 1 capsule (12.5 mg total) by mouth daily.   ipratropium (ATROVENT) 0.03 % nasal spray PLACE 2 SPRAYS INTO BOTH NOSTRILS EVERY 12 (TWELVE) HOURS.   levothyroxine (SYNTHROID) 25 MCG tablet TAKE 1 TABLET BY MOUTH EVERY DAY BEFORE BREAKFAST   losartan (COZAAR) 25 MG tablet Take 1 tablet (25 mg total) by mouth daily.   METAMUCIL FIBER PO Take by mouth daily.   niacinamide 500 MG tablet Take 1 tablet (500 mg total) by mouth daily.   omeprazole (PRILOSEC) 40 MG capsule TAKE 1 CAPSULE (40 MG TOTAL) BY MOUTH DAILY. FOR 2-3 WEEKS THEN AS NEEDED FOR HEARTBURN   Potassium 99 MG TABS Take 1 tablet (99 mg total) by mouth daily.   prednisoLONE acetate (PRED FORTE) 1 % ophthalmic suspension PLEASE SEE ATTACHED FOR DETAILED DIRECTIONS   Probiotic Product (PROBIOTIC PO) Take 1 capsule by mouth daily.    sertraline (ZOLOFT) 25 MG tablet Take 1.5 tablets (37.5 mg total) by mouth at bedtime.   traMADol (ULTRAM) 50 MG tablet Take 1 tablet (50 mg total) by mouth every 8 (eight) hours as needed for moderate pain.   TURMERIC PO Take 900 mg by mouth daily.    No facility-administered encounter medications on file as of 02/03/2021.

## 2021-02-04 DIAGNOSIS — C4491 Basal cell carcinoma of skin, unspecified: Secondary | ICD-10-CM

## 2021-02-04 HISTORY — DX: Basal cell carcinoma of skin, unspecified: C44.91

## 2021-02-05 ENCOUNTER — Encounter: Payer: Self-pay | Admitting: Family Medicine

## 2021-02-08 ENCOUNTER — Encounter: Payer: Self-pay | Admitting: Family Medicine

## 2021-02-08 ENCOUNTER — Other Ambulatory Visit: Payer: Self-pay

## 2021-02-08 ENCOUNTER — Ambulatory Visit (INDEPENDENT_AMBULATORY_CARE_PROVIDER_SITE_OTHER): Payer: Medicare Other | Admitting: Family Medicine

## 2021-02-08 VITALS — BP 120/80 | HR 75 | Temp 98.0°F | Ht 64.5 in | Wt 152.6 lb

## 2021-02-08 DIAGNOSIS — G3184 Mild cognitive impairment, so stated: Secondary | ICD-10-CM

## 2021-02-08 DIAGNOSIS — S2232XA Fracture of one rib, left side, initial encounter for closed fracture: Secondary | ICD-10-CM | POA: Insufficient documentation

## 2021-02-08 DIAGNOSIS — F411 Generalized anxiety disorder: Secondary | ICD-10-CM | POA: Diagnosis not present

## 2021-02-08 DIAGNOSIS — S2242XD Multiple fractures of ribs, left side, subsequent encounter for fracture with routine healing: Secondary | ICD-10-CM | POA: Diagnosis not present

## 2021-02-08 MED ORDER — SERTRALINE HCL 50 MG PO TABS: 50.0000 mg | ORAL_TABLET | Freq: Every day | ORAL | 3 refills | Status: DC

## 2021-02-08 NOTE — Progress Notes (Signed)
Patient ID: Kristin Kramer, female    DOB: March 10, 1940, 80 y.o.   MRN: [id number]  This visit was conducted in person.  BP 120/80   Pulse 75   Temp 98 F (36.7 C) (Temporal)   Ht 5' 4.5" (1.638 m)   Wt 152 lb 9 oz (69.2 kg)   SpO2 94%   BMI 25.78 kg/m    CC: f/u neuro eval  Subjective:   HPI: Kristin Kramer is a 80 y.o. female presenting on 02/08/2021 for Follow-up (Wants to discuss notes/tests done with neuro. )   See prior note for details.  S/p neurocognitive evaluation by Dr Melvyn Novas due to self-reported reading comprehension, trouble following a conversation, and difficulty with expressing her thoughts. Diagnosed with mild cognitive impairment of unclear etiology, ?related to anxiety > depression. Not consistent of alzheimers or other neurodegenerative illness at this time.  Recommendations: Repeat neuropsych eval in 2-2.5 years, sooner if worsening functional decline noted.  Consider medication and psychotherapy for anxiety/depression (specifically CBT or ACT).  Monitor for staring spells or unresponsiveness to consider EEG.  Continue healthy diet and lifestyle for brain health.   She notes ongoing difficulty with anxiety especially to new situations or changes in routine. Notes ongoing excessive worrying.   Seen last week after [place] beach - xray showed L 6th/7th rib fractures. Discussed expected recovery course. She has PRN tramadol to take - takes the edge off.      Relevant past medical, surgical, family and social history reviewed and updated as indicated. Interim medical history since our last visit reviewed. Allergies and medications reviewed and updated. Outpatient Medications Prior to Visit  Medication Sig Dispense Refill   aspirin EC 81 MG tablet Take 81 mg by mouth daily. Swallow whole.     calcium carbonate (OSCAL) 1500 (600 Ca) MG TABS tablet Take 600 mg of elemental calcium by mouth daily with breakfast.     Cholecalciferol  (VITAMIN D-3) 5000 units TABS Take 5,000 Units by mouth daily.      Coenzyme Q10 (COQ10 PO) Take 1 capsule by mouth daily.      donepezil (ARICEPT) 5 MG tablet TAKE 1 TABLET BY MOUTH EVERY DAY 90 tablet 3   ezetimibe (ZETIA) 10 MG tablet TAKE 1 TABLET BY MOUTH EVERY DAY 90 tablet 0   fexofenadine (ALLEGRA ALLERGY) 180 MG tablet Take 1 tablet (180 mg total) by mouth daily.     hydrochlorothiazide (MICROZIDE) 12.5 MG capsule Take 1 capsule (12.5 mg total) by mouth daily. 90 capsule 3   ipratropium (ATROVENT) 0.03 % nasal spray PLACE 2 SPRAYS INTO BOTH NOSTRILS EVERY 12 (TWELVE) HOURS. 90 mL 1   levothyroxine (SYNTHROID) 25 MCG tablet TAKE 1 TABLET BY MOUTH EVERY DAY BEFORE BREAKFAST 90 tablet 0   losartan (COZAAR) 25 MG tablet Take 1 tablet (25 mg total) by mouth daily. 90 tablet 3   METAMUCIL FIBER PO Take by mouth daily.     niacinamide 500 MG tablet Take 1 tablet (500 mg total) by mouth daily.     omeprazole (PRILOSEC) 40 MG capsule TAKE 1 CAPSULE (40 MG TOTAL) BY MOUTH DAILY. FOR 2-3 WEEKS THEN AS NEEDED FOR HEARTBURN 90 capsule 0   Potassium 99 MG TABS Take 1 tablet (99 mg total) by mouth daily.     prednisoLONE acetate (PRED FORTE) 1 % ophthalmic suspension PLEASE SEE ATTACHED FOR DETAILED DIRECTIONS     Probiotic Product (PROBIOTIC PO) Take 1 capsule by mouth daily.  traMADol (ULTRAM) 50 MG tablet Take 1 tablet (50 mg total) by mouth every 8 (eight) hours as needed for moderate pain. 20 tablet 0   TURMERIC PO Take 900 mg by mouth daily.      sertraline (ZOLOFT) 25 MG tablet Take 1.5 tablets (37.5 mg total) by mouth at bedtime. 135 tablet 3   No facility-administered medications prior to visit.     Per HPI unless specifically indicated in ROS section below Review of Systems  Objective:  BP 120/80   Pulse 75   Temp 98 F (36.7 C) (Temporal)   Ht 5' 4.5" (1.638 m)   Wt 152 lb 9 oz (69.2 kg)   SpO2 94%   BMI 25.78 kg/m   Wt Readings from Last 3 Encounters:  02/08/21 152 lb 9  oz (69.2 kg)  02/03/21 151 lb 4 oz (68.6 kg)  09/08/20 147 lb 8 oz (66.9 kg)      Physical Exam Vitals and nursing note reviewed.  Constitutional:      Appearance: Normal appearance. She is not ill-appearing.  Neurological:     Mental Status: She is alert.  Psychiatric:        Mood and Affect: Mood normal.        Behavior: Behavior normal.       Assessment & Plan:  This visit occurred during the SARS-CoV-2 public health emergency.  Safety protocols were in place, including screening questions prior to the visit, additional usage of staff PPE, and extensive cleaning of exam room while observing appropriate contact time as indicated for disinfecting solutions.   Problem List Items Addressed This Visit     Generalized anxiety disorder    Ongoing. Will increase sertraline to 50mg  daily. Will refer to counseling.       Relevant Medications   sertraline (ZOLOFT) 50 MG tablet   Other Relevant Orders   Ambulatory referral to Psychology   Mild cognitive impairment - Primary    Reviewed recent neuropsych eval/work-up.  Reviewed recommendations with patient.  Work towards better anxiety management, reassess memory afterwards.       Relevant Orders   Ambulatory referral to Psychology   Left rib fracture    Discussed recent rib fractures. Recommend scheduled tylenol 500mg  BID to TID and continue tramadol PRN breakthrough pain. rec carry cushion/pillow to use as buffer PRN cough.         Meds ordered this encounter  Medications   sertraline (ZOLOFT) 50 MG tablet    Sig: Take 1 tablet (50 mg total) by mouth at bedtime.    Dispense:  90 tablet    Refill:  3    Note new dose    Orders Placed This Encounter  Procedures   Ambulatory referral to Psychology    Referral Priority:   Routine    Referral Type:   Psychiatric    Referral Reason:   Specialty Services Required    Requested Specialty:   Psychology    Number of Visits Requested:   1      Patient Instructions  Start  taking tylenol 500mg  2-3 times daily every day for next few weeks. May continue tramadol for breakthrough pain. Carry pillow or cushion with you  Increase sertraline to 50mg  daily - new dose at pharmacy. You can take 2 25mg  tablets until you run out.  We will refer you to counselor for evaluation [facility] - in-person).  Keep me updated with how your mood is doing on higher sertraline dose. Good to see you  today Schedule physical after 09/08/2021, come in sooner if needed.   Follow up plan: Return in about 6 months (around 08/09/2021), or if symptoms worsen or fail to improve, for annual exam, prior fasting for blood work, medicare wellness visit.  [name], MD

## 2021-02-08 NOTE — Assessment & Plan Note (Addendum)
Discussed recent rib fractures. Recommend scheduled tylenol 500mg  BID to TID and continue tramadol PRN breakthrough pain. rec carry cushion/pillow to use as buffer PRN cough.

## 2021-02-08 NOTE — Patient Instructions (Addendum)
Start taking tylenol 500mg  2-3 times daily every day for next few weeks. May continue tramadol for breakthrough pain. Carry pillow or cushion with you  Increase sertraline to 50mg  daily - new dose at pharmacy. You can take 2 25mg  tablets until you run out.  We will refer you to counselor for evaluation [facility] - in-person).  Keep me updated with how your mood is doing on higher sertraline dose. Good to see you today Schedule physical after 09/08/2021, come in sooner if needed.

## 2021-02-08 NOTE — Assessment & Plan Note (Signed)
Ongoing. Will increase sertraline to 50mg  daily. Will refer to counseling.

## 2021-02-08 NOTE — Assessment & Plan Note (Signed)
Reviewed recent neuropsych eval/work-up.  Reviewed recommendations with patient.  Work towards better anxiety management, reassess memory afterwards.

## 2021-02-16 ENCOUNTER — Other Ambulatory Visit: Payer: Self-pay | Admitting: Family Medicine

## 2021-02-16 NOTE — Addendum Note (Signed)
Addended by: Brenton Grills on: 91/04/8900 03:16 PM   Modules accepted: Orders

## 2021-02-16 NOTE — Telephone Encounter (Signed)
Name of Medication: Tramadol Name of Pharmacy: [facility] [address] Last Venida Jarvis or Written Date and Quantity: 02/03/21, #20 Last Office Visit and Type: 02/08/21, sinus sxs Next Office Visit and Type: 09/10/21, AWV prt 2 Last Controlled Substance Agreement Date: none Last UDS: none

## 2021-02-16 NOTE — Telephone Encounter (Signed)
°  Encourage patient to contact the pharmacy for refills or they can request refills through [facility]:  Please schedule appointment if longer than 1 year  NEXT APPOINTMENT DATE:04/19/21  MEDICATION:traMADol (ULTRAM) 50 MG tablet  Is the patient out of medication? 2 pills left  PHARMACY:[id number] [address], [place] - [place]  Let patient know to contact pharmacy at the end of the day to make sure medication is ready.  Please notify patient to allow 48-72 hours to process  CLINICAL FILLS OUT ALL BELOW:   LAST REFILL:  QTY:  REFILL DATE:    OTHER COMMENTS:    Okay for refill?  Please advise

## 2021-02-17 ENCOUNTER — Other Ambulatory Visit: Payer: Self-pay | Admitting: Family Medicine

## 2021-02-17 MED ORDER — TRAMADOL HCL 50 MG PO TABS: 50.0000 mg | ORAL_TABLET | Freq: Three times a day (TID) | ORAL | 0 refills | Status: DC | PRN

## 2021-02-17 NOTE — Telephone Encounter (Signed)
Last office visit 02/08/2021 for mild cognitive impairment/GAD & closed fracture multiple ribs.  Last refilled 02/03/2021 for #20 with no refills by Dr. Lorelei Pont.  CPE scheduled 09/10/21.  Ok to refill.

## 2021-02-17 NOTE — Telephone Encounter (Signed)
ERx 

## 2021-03-02 ENCOUNTER — Ambulatory Visit: Payer: Medicare Other | Admitting: Family Medicine

## 2021-03-03 ENCOUNTER — Encounter: Payer: Self-pay | Admitting: Family Medicine

## 2021-03-03 ENCOUNTER — Encounter: Payer: Self-pay | Admitting: *Deleted

## 2021-03-03 NOTE — Telephone Encounter (Signed)
I sent the patient a message this morning on this very same referral. See Mychart message from 03/03/21

## 2021-03-17 ENCOUNTER — Ambulatory Visit
Admission: RE | Admit: 2021-03-17 | Discharge: 2021-03-17 | Disposition: A | Discharge status: Home / Self Care | Payer: Medicare Other | Source: Ambulatory Visit | Attending: Family Medicine | Admitting: Family Medicine

## 2021-03-17 DIAGNOSIS — M85859 Other specified disorders of bone density and structure, unspecified thigh: Secondary | ICD-10-CM

## 2021-03-17 DIAGNOSIS — M8589 Other specified disorders of bone density and structure, multiple sites: Secondary | ICD-10-CM | POA: Diagnosis not present

## 2021-03-17 DIAGNOSIS — Z780 Asymptomatic menopausal state: Secondary | ICD-10-CM | POA: Diagnosis not present

## 2021-03-18 DIAGNOSIS — C44311 Basal cell carcinoma of skin of nose: Secondary | ICD-10-CM | POA: Diagnosis not present

## 2021-03-25 DIAGNOSIS — C44722 Squamous cell carcinoma of skin of right lower limb, including hip: Secondary | ICD-10-CM | POA: Diagnosis not present

## 2021-04-14 ENCOUNTER — Telehealth: Payer: Self-pay

## 2021-04-14 NOTE — Chronic Care Management (AMB) (Addendum)
° ° °  Chronic Care Management Pharmacy Assistant   Name: Kristin Kramer  MRN: [id number] DOB: 30-Dec-1940  Reason for Encounter: CCM Reminder Call   Conditions to be addressed/monitored: HTN and HLD   Medications: Outpatient Encounter Medications as of 04/14/2021  Medication Sig   aspirin EC 81 MG tablet Take 81 mg by mouth daily. Swallow whole.   calcium carbonate (OSCAL) 1500 (600 Ca) MG TABS tablet Take 600 mg of elemental calcium by mouth daily with breakfast.   Cholecalciferol (VITAMIN D-3) 5000 units TABS Take 5,000 Units by mouth daily.    Coenzyme Q10 (COQ10 PO) Take 1 capsule by mouth daily.    donepezil (ARICEPT) 5 MG tablet TAKE 1 TABLET BY MOUTH EVERY DAY   ezetimibe (ZETIA) 10 MG tablet TAKE 1 TABLET BY MOUTH EVERY DAY   fexofenadine (ALLEGRA ALLERGY) 180 MG tablet Take 1 tablet (180 mg total) by mouth daily.   hydrochlorothiazide (MICROZIDE) 12.5 MG capsule Take 1 capsule (12.5 mg total) by mouth daily.   ipratropium (ATROVENT) 0.03 % nasal spray PLACE 2 SPRAYS INTO BOTH NOSTRILS EVERY 12 (TWELVE) HOURS.   levothyroxine (SYNTHROID) 25 MCG tablet TAKE 1 TABLET BY MOUTH EVERY DAY BEFORE BREAKFAST   losartan (COZAAR) 25 MG tablet Take 1 tablet (25 mg total) by mouth daily.   METAMUCIL FIBER PO Take by mouth daily.   niacinamide 500 MG tablet Take 1 tablet (500 mg total) by mouth daily.   omeprazole (PRILOSEC) 40 MG capsule TAKE 1 CAPSULE (40 MG TOTAL) BY MOUTH DAILY. FOR 2-3 WEEKS THEN AS NEEDED FOR HEARTBURN   Potassium 99 MG TABS Take 1 tablet (99 mg total) by mouth daily.   prednisoLONE acetate (PRED FORTE) 1 % ophthalmic suspension PLEASE SEE ATTACHED FOR DETAILED DIRECTIONS   Probiotic Product (PROBIOTIC PO) Take 1 capsule by mouth daily.    sertraline (ZOLOFT) 50 MG tablet Take 1 tablet (50 mg total) by mouth at bedtime.   traMADol (ULTRAM) 50 MG tablet Take 1 tablet (50 mg total) by mouth every 8 (eight) hours as needed for moderate pain.   TURMERIC PO Take 900 mg  by mouth daily.    No facility-administered encounter medications on file as of 04/14/2021.   [name]  did not answer the telephone to remind of upcoming telephone visit with [name] on 04/19/21 at 2:00pm. Patient was reminded to have all medications, supplements and any blood glucose and blood pressure readings available for review at appointment. If unable to reach, a voicemail was left for patient.   Star Rating Drugs: Medication:  Last Fill: Day Supply Losartan 25mg  02/26/21 [place], CPP notified  Avel Sensor, [place] Assistant [phone]  I have reviewed the care management and care coordination activities outlined in this encounter and I am certifying that I agree with the content of this note. No further action required.  [name], PharmD Clinical Pharmacist [facility] at [facility] [phone]

## 2021-04-19 ENCOUNTER — Ambulatory Visit (INDEPENDENT_AMBULATORY_CARE_PROVIDER_SITE_OTHER): Payer: Medicare Other

## 2021-04-19 ENCOUNTER — Telehealth: Payer: Self-pay

## 2021-04-19 ENCOUNTER — Other Ambulatory Visit: Payer: Self-pay

## 2021-04-19 DIAGNOSIS — E785 Hyperlipidemia, unspecified: Secondary | ICD-10-CM

## 2021-04-19 DIAGNOSIS — F330 Major depressive disorder, recurrent, mild: Secondary | ICD-10-CM

## 2021-04-19 DIAGNOSIS — F411 Generalized anxiety disorder: Secondary | ICD-10-CM

## 2021-04-19 NOTE — Progress Notes (Deleted)
Chronic Care Management Pharmacy Note  04/19/2021 Name:  Kristin Kramer MRN:  [id number] DOB:  02-05-1941  Summary: ***  Recommendations/Changes made from today's visit: ***  Plan: ***   Subjective: Kristin Kramer is an 81 y.o. year old female who is a primary patient of [name], MD.  The CCM team was consulted for assistance with disease management and care coordination needs.    Engaged with patient by telephone for follow up visit in response to provider referral for pharmacy case management and/or care coordination services.   Consent to Services:  The patient was given information about Chronic Care Management services, agreed to services, and gave verbal consent prior to initiation of services.  Please see initial visit note for detailed documentation.   Patient Care Team: [name], MD as PCP - General (Family Medicine) [name], [name] as Consulting Physician (Optometry) [name], [facility] as Pharmacist (Pharmacist)  Recent office visits: 02/08/21 - [name], MD, PCP - Pt presented for mild cognitive impairment. Increase sertraline to 50 mg. Refer to counseling.  09/08/20 - Pt presented for Medicare AWV. [name] notices progressive difficulty with memory which is concerning.  Will refer for neurocognitive evaluation.   Recent consult visits: 12/30/20 - Neurology - Pt presented for mild cognitive impairment.   Hospital visits: None in previous 6 months   Objective:  Lab Results  Component Value Date   CREATININE 0.86 09/01/2020   BUN 21 09/01/2020   GFR 64.19 09/01/2020   GFRNONAA >60 08/08/2019   GFRAA >60 08/08/2019   NA 140 09/01/2020   K 4.7 09/01/2020   CALCIUM 10.4 09/01/2020   CO2 29 09/01/2020   GLUCOSE 93 09/01/2020    Lab Results  Component Value Date/Time   HGBA1C 5.7 07/10/2019 11:18 AM   GFR 64.19 09/01/2020 08:14 AM   GFR 74.62 05/11/2020 02:36 PM    Last diabetic Eye exam: No results found for:  HMDIABEYEEXA  Last diabetic Foot exam: No results found for: HMDIABFOOTEX   Lab Results  Component Value Date   CHOL 213 (H) 09/01/2020   HDL 58.50 09/01/2020   LDLCALC 130 (H) 09/01/2020   LDLDIRECT 146.4 08/13/2012   TRIG 120.0 09/01/2020   CHOLHDL 4 09/01/2020    Hepatic Function Latest Ref Rng & Units 09/01/2020 08/29/2019 07/29/2019  Total Protein 6.0 - 8.3 g/dL 7.1 7.3 7.7  Albumin 3.5 - 5.2 g/dL 4.6 4.5 4.4  AST 0 - 37 U/L 24 28 23   ALT 0 - 35 U/L 21 22 24   Alk Phosphatase 39 - 117 U/L 111 255(H) 110  Total Bilirubin 0.2 - 1.2 mg/dL 0.4 0.6 0.4    Lab Results  Component Value Date/Time   TSH 3.41 09/01/2020 08:14 AM   TSH 3.37 08/29/2019 08:10 AM   FREET4 1.12 08/29/2019 08:10 AM   FREET4 0.66 12/17/2018 08:13 AM    CBC Latest Ref Rng & Units 09/01/2020 08/29/2019 08/08/2019  WBC 4.0 - 10.5 K/uL 6.1 6.0 8.3  Hemoglobin 12.0 - 15.0 g/dL 13.4 12.5 10.4(L)  Hematocrit 36.0 - 46.0 % 39.7 37.9 32.7(L)  Platelets 150.0 - 400.0 K/uL 240.0 427.0(H) 185    Lab Results  Component Value Date/Time   VD25OH 60.76 09/01/2020 08:14 AM   VD25OH 40.80 08/29/2019 08:10 AM    Clinical ASCVD: No  The ASCVD Risk score (Arnett DK, et al., 2019) failed to calculate for the following reasons:   The 2019 ASCVD risk score is only valid for ages 60 to 25  Depression screen [facility] 2/9 09/08/2020 08/29/2019 08/15/2018  Decreased Interest 0 0 0  Down, Depressed, Hopeless 1 0 0  PHQ - 2 Score 1 0 0  Altered sleeping 2 0 0  Tired, decreased energy 3 0 0  Change in appetite 0 0 0  Feeling bad or failure about yourself  0 0 0  Trouble concentrating 3 0 0  Moving slowly or fidgety/restless 0 0 0  Suicidal thoughts 0 0 0  PHQ-9 Score 9 0 0  Difficult doing work/chores - Not difficult at all Not difficult at all  Some recent data might be hidden     DEXA 03/2021 - L femur neck -2.2 with increased risk of fracture  Social History   Tobacco Use  Smoking Status Never  Smokeless Tobacco Never    BP Readings from Last 3 Encounters:  02/08/21 120/80  02/03/21 120/76  09/08/20 112/68   Pulse Readings from Last 3 Encounters:  02/08/21 75  02/03/21 94  09/08/20 84   Wt Readings from Last 3 Encounters:  02/08/21 152 lb 9 oz (69.2 kg)  02/03/21 151 lb 4 oz (68.6 kg)  09/08/20 147 lb 8 oz (66.9 kg)   BMI Readings from Last 3 Encounters:  02/08/21 25.78 kg/m  02/03/21 25.56 kg/m  09/08/20 24.93 kg/m    Assessment/Interventions: Review of patient past medical history, allergies, medications, health status, including review of consultants reports, laboratory and other test data, was performed as part of comprehensive evaluation and provision of chronic care management services.   SDOH:  (Social Determinants of Health) assessments and interventions performed: Yes  SDOH Screenings   Alcohol Screen: Not on file  Depression (PHQ2-9): Medium Risk   PHQ-2 Score: 9  Financial Resource Strain: Not on file  Food Insecurity: Not on file  Housing: Not on file  Physical Activity: Not on file  Social Connections: Not on file  Stress: Not on file  Tobacco Use: Low Risk    Smoking Tobacco Use: Never   Smokeless Tobacco Use: Never   Passive Exposure: Not on file  Transportation Needs: Not on file    [place]  Allergies  Allergen Reactions   Blood-Group Specific Substance    Lipitor [Atorvastatin] Other (See Comments)    Cramps   Pravastatin Other (See Comments)    Cramps     Medications Reviewed Today     Reviewed by Brenton Grills, [place] (Certified Medical Assistant) on 02/08/21 at [phone]  Med List Status: <None>   Medication Order Taking? Sig Documenting Provider Last Dose Status Informant  aspirin EC 81 MG tablet [id number]  Take 81 mg by mouth daily. Swallow whole. [name], MD  Active            Med Note [name], CAROL L   Tue Jun 02, 2020  9:45 AM)    calcium carbonate (OSCAL) 1500 (600 Ca) MG TABS tablet [id number]  Take 600 mg of elemental calcium by  mouth daily with breakfast. [provider]  Active Self  Cholecalciferol (VITAMIN D-3) 5000 units TABS [id number]  Take 5,000 Units by mouth daily.  [provider]  Active Self  Coenzyme Q10 (COQ10 PO) [id number]  Take 1 capsule by mouth daily.  [provider]  Active Self  donepezil (ARICEPT) 5 MG tablet [id number]  TAKE 1 TABLET BY MOUTH EVERY DAY [name], MD  Active   ezetimibe (ZETIA) 10 MG tablet [id number]  TAKE 1 TABLET BY MOUTH EVERY DAY [name], MD  Active  fexofenadine (ALLEGRA ALLERGY) 180 MG tablet [id number]  Take 1 tablet (180 mg total) by mouth daily. [name], MD  Active   hydrochlorothiazide (MICROZIDE) 12.5 MG capsule [id number]  Take 1 capsule (12.5 mg total) by mouth daily. Minna Merritts, MD  Active   ipratropium (ATROVENT) 0.03 % nasal spray [id number]  PLACE 2 SPRAYS INTO BOTH NOSTRILS EVERY 12 (TWELVE) HOURS. [name], MD  Active   levothyroxine (SYNTHROID) 25 MCG tablet [id number]  TAKE 1 TABLET BY MOUTH EVERY DAY BEFORE Sander Radon, MD  Active   losartan (COZAAR) 25 MG tablet [id number]  Take 1 tablet (25 mg total) by mouth daily. Minna Merritts, MD  Active   METAMUCIL FIBER PO [id number]  Take by mouth daily. [provider]  Active   niacinamide 500 MG tablet [id number]  Take 1 tablet (500 mg total) by mouth daily. [name], MD  Active            Med Note ([place]   Tue Jun 02, 2020  9:46 AM)    omeprazole (PRILOSEC) 40 MG capsule [id number]  TAKE 1 CAPSULE (40 MG TOTAL) BY MOUTH DAILY. FOR 2-3 WEEKS THEN AS NEEDED FOR Estrella Myrtle, MD  Active   Potassium 99 MG TABS [id number]  Take 1 tablet (99 mg total) by mouth daily. [name], MD  Active   prednisoLONE acetate (PRED FORTE) 1 % ophthalmic suspension [id number]  PLEASE SEE ATTACHED FOR DETAILED DIRECTIONS [provider]  Active   Probiotic Product (PROBIOTIC PO) [id number]  Take 1  capsule by mouth daily.  [provider]  Active Self  sertraline (ZOLOFT) 25 MG tablet [id number]  Take 1.5 tablets (37.5 mg total) by mouth at bedtime. [name], MD  Active   traMADol Veatrice Bourbon) 50 MG tablet [id number]  Take 1 tablet (50 mg total) by mouth every 8 (eight) hours as needed for moderate pain. Owens Loffler, MD  Active   TURMERIC PO [id number]  Take 900 mg by mouth daily.  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   Situational stress 07/10/2019   Osteopenia 09/03/2018   HLD (hyperlipidemia) 09/03/2018   Vitamin D deficiency 09/03/2018   Leg cramping 08/15/2018   Hypothyroidism 08/15/2018   TMJ tenderness, right 02/08/2018   Lactose intolerance 02/08/2018   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   Chronic nasal congestion 04/19/2017   Exertional dyspnea 04/19/2017   Cervical neck pain with evidence of disc disease 08/05/2016   Advanced care planning/counseling discussion 07/31/2014   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   Osteoarthritis of knee 03/18/2013   Postoperative nausea and vomiting    Transfusion of blood product refused for religious reason    Medicare annual wellness visit, subsequent 01/14/2013   Elevated alkaline phosphatase level 01/03/2013   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   Seasonal allergies    Squamous cell carcinoma of skin    Major depressive disorder    Abnormal EKG  Immunization  History  Administered Date(s) Administered   Influenza Split 12/01/2011   Influenza, High Dose Seasonal PF 02/10/2014, 11/27/2017, 11/28/2018, 11/26/2019, 11/10/2020   Influenza,inj,Quad PF,6+ Mos 01/14/2013, 12/29/2014, 02/04/2016   Influenza-Unspecified 01/03/2017   PFIZER Comirnaty(Gray Top)Covid-19 Tri-Sucrose Vaccine 07/14/2020   PFIZER(Purple Top)SARS-COV-2 Vaccination 04/15/2019, 05/06/2019, 12/31/2019   Pfizer Covid-19 Vaccine Bivalent Booster 66yr & up 01/14/2021   Pneumococcal Conjugate-13 07/31/2014   Pneumococcal Polysaccharide-23 12/01/2011   Tdap 10/22/2014   Zoster Recombinat (Shingrix) 11/29/2016, 10/21/2017   Zoster, Live 02/14/2013    Conditions to be addressed/monitored:  Hyperlipidemia and Anxiety  There are no care plans that you recently modified to display for this patient.   Current Barriers:  {pharmacybarriers:24917}  Pharmacist Clinical Goal(s):  Patient will {PHARMACYGOALCHOICES:24921} through collaboration with PharmD and provider.   Interventions: 1:1 collaboration with GRia Bush MD regarding development and update of comprehensive plan of care as evidenced by provider attestation and co-signature Inter-disciplinary care team collaboration (see longitudinal plan of care) Comprehensive medication review performed; medication list updated in electronic medical record  Hypertension (BP goal {CHL HP UPSTREAM Pharmacist BP ranges:[phone]}) -{US controlled/uncontrolled:25276} -Current treatment: Lisinopril HCTZ -Medications previously tried: ***  -Current home readings: *** -Current dietary habits: *** -Current exercise habits: *** -{ACTIONS;DENIES/REPORTS:21021675::"Denies"} hypotensive/hypertensive symptoms -Educated on {CCM BP Counseling:25124} -Counseled to monitor BP at home ***, document, and provide log at future appointments -{CCMPHARMDINTERVENTION:25122}  Hyperlipidemia: (LDL goal < ***) -{US  controlled/uncontrolled:25276} -Current treatment: Ezetimibe 10 mg - 1 tablet daily Niacin 500 mg - 1 tablet daily -Medications previously tried: pravastatin, atorvastatin  -Current dietary patterns: *** -Current exercise habits: *** -Educated on {CCM HLD Counseling:25126} -{CCMPHARMDINTERVENTION:25122}  Depression/Anxiety (Goal: ***) -{US controlled/uncontrolled:25276}, referred to counseling -Current treatment: Sertraline 50 mg - 1 tablet daily -Medications previously tried/failed: *** -PHQ9: *** -GAD7: *** -Connected with *** for mental health support -Educated on {CCM mental health counseling:25127} -{CCMPHARMDINTERVENTION:25122}  Patient Goals/Self-Care Activities Patient will:  - {pharmacypatientgoals:24919}  Follow Up Plan: {CM FOLLOW UP PYJEH:63149}   Medication Assistance: {MEDASSISTANCEINFO:25044}  Compliance/Adherence/Medication fill history: Care Gaps: ***  Star-Rating Drugs: ***  Patient's [facility] pharmacy [facility]:  [id number] [place]: [phone]: [phone] pill box? {Yes or If no, why not?:20788} Pt endorses ***% compliance  We discussed: {Pharmacy options:24294} Patient decided to: [facility] and Follow Up Patient Decision:  Patient agrees to Care Plan and Follow-up.  MiDebbora DusPharmD Clinical Pharmacist [phone]

## 2021-04-19 NOTE — Patient Instructions (Signed)
Dear [name],  Below is a summary of the goals we discussed during our follow up appointment on April 19, 2021. Please contact me anytime with questions or concerns.   Visit Information  Patient Care Plan: CCM Pharmacy Care Plan     Problem Identified: CHL AMB "PATIENT-SPECIFIC PROBLEM"      Long-Range Goal: Disease Management   Start Date: 04/19/2021  Priority: High  Note:    Current Barriers:  Suboptimal therapeutic regimen for anxiety/depression  Pharmacist Clinical Goal(s):  Patient will contact provider office for questions/concerns as evidenced notation of same in electronic health record through collaboration with PharmD and provider.   Interventions: 1:1 collaboration with [name], MD regarding development and update of comprehensive plan of care as evidenced by provider attestation and co-signature Inter-disciplinary care team collaboration (see longitudinal plan of care) Comprehensive medication review performed; medication list updated in electronic medical record  Hyperlipidemia: (LDL goal < 100) -Not ideally controlled, LDL 130 - adequate control for age, given statin intolerance and limited treatment options  -Current treatment: Ezetimibe 10 mg - 1 tablet daily OTC Niacin 500 mg - 1 tablet daily -Medications previously tried: pravastatin, atorvastatin  -Current dietary patterns: limits foods high in cholesterol -Current exercise habits: walks three times weekly -Recommended to continue current medication  Depression/Anxiety (Goal: 50% or greater reduction in PHQ-9 and GAD-7) -Uncontrolled, PCP increased sertraline to 50 mg daily and referred to counseling December 2022.  Pt reports some improvement in anxiety since then, but feels the sertraline primarily helps with her depressed mood. She also noticed hand tremor since sertraline dose increase. She has not reached out to counseling services. Does not feel ready for this step. She feels  overwhelmed by memory changes and her health. She continues to participate in book club, playing cards twice weekly, reading often, and having dinner with friends. She lives alone, but stays active with community. She is interested in medication for anxiety PRN. -Current treatment: Sertraline 50 mg - 1 tablet daily -Medications previously tried/failed: lexapro, celexa, wellbutrin -PHQ9: 9 (09/08/20) -GAD7: 14 (09/08/20) -Educated on Benefits of medication for symptom control Benefits of cognitive-behavioral therapy with or without medication -Recommended reduce sertraline back to previous dose - 37.5 mg due to tremors. Consider trial of buspirone 5 mg - TID PRN anxiety. Recommend follow up with PCP within 1 month.   Patient Goals/Self-Care Activities Patient will:  - take medications as prescribed as evidenced by patient report and record review - schedule follow up with PCP   Follow Up Plan: Telephone follow up appointment with care management team member scheduled for: - CCM PharmD 1 month      Patient verbalizes understanding of instructions and care plan provided today and agrees to view in [place]. Active MyChart status confirmed with patient.    [name], PharmD Clinical Pharmacist Practitioner [facility] at [facility] [phone]

## 2021-04-19 NOTE — Telephone Encounter (Signed)
Please review recommendations from CCM visit today:   1) Recommended reduce sertraline back to previous dose - 37.5 mg due to hand tremor on 50 mg. 2) Recommend trial buspirone 5 mg - BID PRN anxiety.  Recommend follow up with PCP within 1 month.

## 2021-04-19 NOTE — Progress Notes (Signed)
Chronic Care Management Pharmacy Note  04/19/2021 Name:  Kristin Kramer MRN:  [id number] DOB:  11/25/1940  Summary: -CCM follow up visit. Primary concern is anxiety, memory, and difficult concentrating. Pt increased sertraline to 50 mg daily as recommended by PCP 2 months ago, but is not tolerating due to new onset hand tremors. She also decided not to pursue counseling. Pt reports some improvement in anxiety on sertraline 50 mg, but feels inadequate response. She tolerated previous dose well (37.5 mg daily). She is interested in medication for anxiety PRN.  Of note, she continues to participate in book club, playing cards twice weekly, reading often, and having dinner with friends.  Recommendations/Changes made from today's visit: -Recommended reduce sertraline back to previous dose - 37.5 mg due to tremors (pt has this dose on hand). Consider trial of buspirone 5 mg - BID PRN anxiety pending PCP approval. Recommend follow up with PCP within 1 month.   Plan: -CCM PharmD follow up 1 month    Subjective: Kristin Kramer is an 81 y.o. year old female who is a primary patient of [name], MD.  The CCM team was consulted for assistance with disease management and care coordination needs.    Engaged with patient by telephone for follow up visit in response to provider referral for pharmacy case management and/or care coordination services.   Consent to Services:  The patient was given information about Chronic Care Management services, agreed to services, and gave verbal consent prior to initiation of services.  Please see initial visit note for detailed documentation.   Patient Care Team: [name], MD as PCP - General (Family Medicine) [name], [place] as Consulting Physician (Optometry) [name], [facility] as Pharmacist (Pharmacist)  Recent office visits: 03/20/21 - [name], MD, PCP - Telephone note - Your bone density scan returned showing stable  osteopenia - however with increased risk of fracture. For this reason we should consider medication to strengthen bones like weekly fosamax or prolia injection every 6 months. If interested, schedule an office visit to review treatment options.  02/08/21 - [name], MD, PCP - Pt presented for mild cognitive impairment. Increase sertraline to 50 mg. Refer to counseling. Work towards better anxiety management, reassess memory afterwards. Discussed recent rib fractures. Recommend scheduled tylenol 585m BID to TID and continue tramadol PRN breakthrough pain. Follow up 6 months. 02/03/21 - SOwens Loffler MD - Pt presented for rib pain. Continue tramadol PRN.  09/08/20 - Pt presented for Medicare AWV. Pt notices progressive difficulty with memory which is concerning. Will refer for neurocognitive evaluation.   Recent consult visits: 12/30/20 - Neurology - Pt presented for mild cognitive impairment. Discussed results.  12/23/20 - Neurology - Recommend see PCP for anxiety/depression treatment.   Hospital visits: None in previous 6 months   Objective:  Lab Results  Component Value Date   CREATININE 0.86 09/01/2020   BUN 21 09/01/2020   GFR 64.19 09/01/2020   GFRNONAA >60 08/08/2019   GFRAA >60 08/08/2019   NA 140 09/01/2020   K 4.7 09/01/2020   CALCIUM 10.4 09/01/2020   CO2 29 09/01/2020   GLUCOSE 93 09/01/2020    Lab Results  Component Value Date/Time   HGBA1C 5.7 07/10/2019 11:18 AM   GFR 64.19 09/01/2020 08:14 AM   GFR 74.62 05/11/2020 02:36 PM     Lab Results  Component Value Date   CHOL 213 (H) 09/01/2020   HDL 58.50 09/01/2020   LDLCALC 130 (H) 09/01/2020   LDLDIRECT  146.4 08/13/2012   TRIG 120.0 09/01/2020   CHOLHDL 4 09/01/2020    Hepatic Function Latest Ref Rng & Units 09/01/2020 08/29/2019 07/29/2019  Total Protein 6.0 - 8.3 g/dL 7.1 7.3 7.7  Albumin 3.5 - 5.2 g/dL 4.6 4.5 4.4  AST 0 - 37 U/L 24 28 23   ALT 0 - 35 U/L 21 22 24   Alk Phosphatase 39 - 117 U/L 111  255(H) 110  Total Bilirubin 0.2 - 1.2 mg/dL 0.4 0.6 0.4    Lab Results  Component Value Date/Time   TSH 3.41 09/01/2020 08:14 AM   TSH 3.37 08/29/2019 08:10 AM   FREET4 1.12 08/29/2019 08:10 AM   FREET4 0.66 12/17/2018 08:13 AM    CBC Latest Ref Rng & Units 09/01/2020 08/29/2019 08/08/2019  WBC 4.0 - 10.5 K/uL 6.1 6.0 8.3  Hemoglobin 12.0 - 15.0 g/dL 13.4 12.5 10.4(L)  Hematocrit 36.0 - 46.0 % 39.7 37.9 32.7(L)  Platelets 150.0 - 400.0 K/uL 240.0 427.0(H) 185    Lab Results  Component Value Date/Time   VD25OH 60.76 09/01/2020 08:14 AM   VD25OH 40.80 08/29/2019 08:10 AM    Clinical ASCVD: No  The ASCVD Risk score (Arnett DK, et al., 2019) failed to calculate for the following reasons:   The 2019 ASCVD risk score is only valid for ages 26 to 56    Depression screen PHQ 2/9 09/08/2020 08/29/2019 08/15/2018  Decreased Interest 0 0 0  Down, Depressed, Hopeless 1 0 0  PHQ - 2 Score 1 0 0  Altered sleeping 2 0 0  Tired, decreased energy 3 0 0  Change in appetite 0 0 0  Feeling bad or failure about yourself  0 0 0  Trouble concentrating 3 0 0  Moving slowly or fidgety/restless 0 0 0  Suicidal thoughts 0 0 0  PHQ-9 Score 9 0 0  Difficult doing work/chores - Not difficult at all Not difficult at all  Some recent data might be hidden     DEXA 03/2021 - L femur neck -2.2 with increased risk of fracture  Social History   Tobacco Use  Smoking Status Never  Smokeless Tobacco Never   BP Readings from Last 3 Encounters:  02/08/21 120/80  02/03/21 120/76  09/08/20 112/68   Pulse Readings from Last 3 Encounters:  02/08/21 75  02/03/21 94  09/08/20 84   Wt Readings from Last 3 Encounters:  02/08/21 152 lb 9 oz (69.2 kg)  02/03/21 151 lb 4 oz (68.6 kg)  09/08/20 147 lb 8 oz (66.9 kg)   BMI Readings from Last 3 Encounters:  02/08/21 25.78 kg/m  02/03/21 25.56 kg/m  09/08/20 24.93 kg/m    Assessment/Interventions: Review of patient past medical history, allergies,  medications, health status, including review of consultants reports, laboratory and other test data, was performed as part of comprehensive evaluation and provision of chronic care management services.   SDOH:  (Social Determinants of Health) assessments and interventions performed: Yes SDOH Interventions    Flowsheet Row Most Recent Value  SDOH Interventions   Financial Strain Interventions Intervention Not Indicated  Transportation Interventions Intervention Not Indicated      SDOH Screenings   Alcohol Screen: Not on file  Depression (PHQ2-9): Medium Risk   PHQ-2 Score: 9  Financial Resource Strain: Low Risk    Difficulty of Paying Living Expenses: Not hard at all  Food Insecurity: Not on file  Housing: Not on file  Physical Activity: Not on file  Social Connections: Not on file  Stress:  Not on file  Tobacco Use: Low Risk    Smoking Tobacco Use: Never   Smokeless Tobacco Use: Never   Passive Exposure: Not on file  Transportation Needs: No Transportation Needs   Lack of Transportation (Medical): No   Lack of Transportation (Non-Medical): No    CCM Care Plan  Allergies  Allergen Reactions   Blood-Group Specific Substance    Lipitor [Atorvastatin] Other (See Comments)    Cramps   Pravastatin Other (See Comments)    Cramps     Medications Reviewed Today     Reviewed by [name], [facility] (Pharmacist) on 04/19/21 at 1044  Med List Status: <None>   Medication Order Taking? Sig Documenting Provider Last Dose Status Informant  aspirin EC 81 MG tablet [id number] Yes Take 81 mg by mouth daily. Swallow whole. [name], MD Taking Active            Med Note (Marcus   Tue Jun 02, 2020  9:45 AM)    calcium carbonate (OSCAL) 1500 (600 Ca) MG TABS tablet [id number] Yes Take 600 mg of elemental calcium by mouth daily with breakfast. [provider] Taking Active Self  Cholecalciferol (VITAMIN D-3) 5000 units TABS [id number] Yes Take 5,000 Units by mouth  daily.  [provider] Taking Active Self  Coenzyme Q10 (COQ10 PO) [id number]  Take 1 capsule by mouth daily.  [provider]  Active Self  donepezil (ARICEPT) 5 MG tablet [id number] Yes TAKE 1 TABLET BY MOUTH EVERY DAY [name], MD Taking Active   ezetimibe (ZETIA) 10 MG tablet [id number] Yes TAKE 1 TABLET BY MOUTH EVERY DAY [name], MD Taking Active   fexofenadine [facility] ALLERGY) 180 MG tablet [id number] Yes Take 1 tablet (180 mg total) by mouth daily. [name], MD Taking Active   hydrochlorothiazide (MICROZIDE) 12.5 MG capsule [id number] Yes Take 1 capsule (12.5 mg total) by mouth daily. Minna Merritts, MD Taking Active   levothyroxine (SYNTHROID) 25 MCG tablet [id number] Yes TAKE 1 TABLET BY MOUTH EVERY DAY BEFORE Sander Radon, MD Taking Active   losartan (COZAAR) 25 MG tablet [id number] Yes Take 1 tablet (25 mg total) by mouth daily. Minna Merritts, MD Taking Active   METAMUCIL FIBER PO [id number] Yes Take by mouth daily. [provider] Taking Active   niacinamide 500 MG tablet [id number] Yes Take 1 tablet (500 mg total) by mouth daily. [name], MD Taking Active            Med Note Ronaldo Miyamoto   Tue Jun 02, 2020  9:46 AM)    omeprazole (PRILOSEC) 40 MG capsule [id number] Yes TAKE 1 CAPSULE (40 MG TOTAL) BY MOUTH DAILY. FOR 2-3 WEEKS THEN AS NEEDED FOR Estrella Myrtle, MD Taking Active   Potassium 99 MG TABS [id number] Yes Take 1 tablet (99 mg total) by mouth daily. [name], MD Taking Active   prednisoLONE acetate (PRED FORTE) 1 % ophthalmic suspension [id number] No PLEASE SEE ATTACHED FOR DETAILED DIRECTIONS  Patient not taking: Reported on 04/19/2021   [provider] Not Taking Active   Probiotic Product (PROBIOTIC PO) [id number] Yes Take 1 capsule by mouth daily.  [provider] Taking Active Self  sertraline (ZOLOFT) 50 MG tablet [id number] Yes Take 1 tablet (50 mg  total) by mouth at bedtime. [name], MD Taking Active   traMADol Veatrice Bourbon) 50 MG tablet [id number] Yes Take 1 tablet (50 mg total) by mouth every 8 (eight) hours as needed for moderate  pain. [name], MD Taking Active   TURMERIC PO [id number] Yes Take 900 mg by mouth daily.  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   Situational stress 07/10/2019   Osteopenia 09/03/2018   HLD (hyperlipidemia) 09/03/2018   Vitamin D deficiency 09/03/2018   Leg cramping 08/15/2018   Hypothyroidism 08/15/2018   TMJ tenderness, right 02/08/2018   Lactose intolerance 02/08/2018   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   Chronic nasal congestion 04/19/2017   Exertional dyspnea 04/19/2017   Cervical neck pain with evidence of disc disease 08/05/2016   Advanced care planning/counseling discussion 07/31/2014   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   Osteoarthritis of knee 03/18/2013   Postoperative nausea and vomiting    Transfusion of blood product refused for religious reason    Medicare annual wellness visit, subsequent 01/14/2013   Elevated alkaline phosphatase level 01/03/2013   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   Seasonal allergies    Squamous cell carcinoma of skin    Major depressive disorder    Abnormal EKG     Immunization History  Administered Date(s) Administered   Influenza Split 12/01/2011   Influenza, High Dose Seasonal PF  02/10/2014, 11/27/2017, 11/28/2018, 11/26/2019, 11/10/2020   Influenza,inj,Quad PF,6+ Mos 01/14/2013, 12/29/2014, 02/04/2016   Influenza-Unspecified 01/03/2017   PFIZER Comirnaty(Gray Top)Covid-19 Tri-Sucrose Vaccine 07/14/2020   PFIZER(Purple Top)SARS-COV-2 Vaccination 04/15/2019, 05/06/2019, 12/31/2019   Pfizer Covid-19 Vaccine Bivalent Booster 8yr & up 01/14/2021   Pneumococcal Conjugate-13 07/31/2014   Pneumococcal Polysaccharide-23 12/01/2011   Tdap 10/22/2014   Zoster Recombinat (Shingrix) 11/29/2016, 10/21/2017   Zoster, Live 02/14/2013    Conditions to be addressed/monitored:  Hyperlipidemia and Anxiety  Care Plan : CHemet Updates made by ADebbora Dus RMount Jacksonsince 04/19/2021 12:00 AM     Problem: CHL AMB "PATIENT-SPECIFIC PROBLEM"      Long-Range Goal: Disease Management   Start Date: 04/19/2021  Priority: High  Note:    Current Barriers:  Suboptimal therapeutic regimen for anxiety/depression  Pharmacist Clinical Goal(s):  Patient will contact provider office for questions/concerns as evidenced notation of same in electronic health record through collaboration with PharmD and provider.   Interventions: 1:1 collaboration with GRia Bush MD regarding development and update of comprehensive plan of care as evidenced by provider attestation and co-signature Inter-disciplinary care team collaboration (see longitudinal plan of care) Comprehensive medication review performed; medication list updated in electronic medical record  Hyperlipidemia: (LDL goal < 100) -Not ideally controlled, LDL 130 - adequate control for age, given statin intolerance and limited treatment options  -Current treatment: Ezetimibe 10 mg - 1 tablet daily OTC Niacin 500 mg - 1 tablet daily -Medications previously tried: pravastatin, atorvastatin  -Current dietary patterns: limits foods high in cholesterol -Current exercise habits: walks three times weekly -Recommended to  continue current medication  Depression/Anxiety (Goal: 50% or greater reduction in PHQ-9 and GAD-7) -Uncontrolled, PCP increased sertraline to 50 mg daily and referred to counseling December 2022.  Pt reports  some improvement in anxiety since then, but feels the sertraline primarily helps with her depressed mood. She also noticed hand tremor since sertraline dose increase. She has not reached out to counseling services. Does not feel ready for this step. She feels overwhelmed by memory changes and her health. She continues to participate in book club, playing cards twice weekly, reading often, and having dinner with friends. She lives alone, but stays active with community. She is interested in medication for anxiety PRN. -Current treatment: Sertraline 50 mg - 1 tablet daily -Medications previously tried/failed: lexapro, celexa, wellbutrin -PHQ9: 9 (09/08/20) -GAD7: 14 (09/08/20) -Educated on Benefits of medication for symptom control Benefits of cognitive-behavioral therapy with or without medication -Recommended reduce sertraline back to previous dose - 37.5 mg due to tremors. Consider trial of buspirone 5 mg - TID PRN anxiety. Recommend follow up with PCP within 1 month.   Patient Goals/Self-Care Activities Patient will:  - take medications as prescribed as evidenced by patient report and record review - schedule follow up with PCP   Follow Up Plan: Telephone follow up appointment with care management team member scheduled for: - CCM PharmD 1 month    Medication Assistance: None required.  Patient affirms current coverage meets needs.  Compliance/Adherence/Medication fill history: Care Gaps: None  Star-Rating Drugs: Medication:                Last Fill:         Day Supply Losartan 40m            02/26/21          90  Patient's preferred pharmacy is:  [id number] [id number]: [phone]:  [phone] pill box? Yes Pt endorses 100% compliance  Care Plan and Follow Up Patient Decision:  Patient agrees to Care Plan and Follow-up.  MiDebbora DusPharmD Clinical Pharmacist Practitioner [phone]

## 2021-04-20 MED ORDER — SERTRALINE HCL 25 MG PO TABS: 37.5000 mg | ORAL_TABLET | Freq: Every day | ORAL | 0 refills | Status: DC

## 2021-04-20 MED ORDER — BUSPIRONE HCL 5 MG PO TABS: 5.0000 mg | ORAL_TABLET | Freq: Two times a day (BID) | ORAL | 0 refills | Status: DC | PRN

## 2021-04-20 NOTE — Telephone Encounter (Signed)
Contacted patient to review med changes, left VM. She would like a call to schedule PCP visit on or around 05/18/2021.

## 2021-04-20 NOTE — Telephone Encounter (Signed)
Agree.  Sent. Thanks.

## 2021-05-01 ENCOUNTER — Other Ambulatory Visit: Payer: Self-pay | Admitting: Family Medicine

## 2021-05-04 DIAGNOSIS — F330 Major depressive disorder, recurrent, mild: Secondary | ICD-10-CM | POA: Diagnosis not present

## 2021-05-04 DIAGNOSIS — Z9842 Cataract extraction status, left eye: Secondary | ICD-10-CM | POA: Diagnosis not present

## 2021-05-04 DIAGNOSIS — H04123 Dry eye syndrome of bilateral lacrimal glands: Secondary | ICD-10-CM | POA: Diagnosis not present

## 2021-05-04 DIAGNOSIS — Z9841 Cataract extraction status, right eye: Secondary | ICD-10-CM | POA: Diagnosis not present

## 2021-05-04 DIAGNOSIS — H16223 Keratoconjunctivitis sicca, not specified as Sjogren's, bilateral: Secondary | ICD-10-CM | POA: Diagnosis not present

## 2021-05-04 DIAGNOSIS — E785 Hyperlipidemia, unspecified: Secondary | ICD-10-CM | POA: Diagnosis not present

## 2021-05-04 DIAGNOSIS — H52223 Regular astigmatism, bilateral: Secondary | ICD-10-CM | POA: Diagnosis not present

## 2021-05-12 ENCOUNTER — Other Ambulatory Visit: Payer: Self-pay | Admitting: Family Medicine

## 2021-05-12 DIAGNOSIS — F411 Generalized anxiety disorder: Secondary | ICD-10-CM

## 2021-05-14 NOTE — Telephone Encounter (Signed)
Ok to send 90-day rx? ? ?Last OV: 02/08/21,  MCI/GAD f/u ?Next OV:  09/10/21, AWV prt 2 ?

## 2021-05-17 ENCOUNTER — Telehealth: Payer: Self-pay

## 2021-05-17 NOTE — Chronic Care Management (AMB) (Signed)
? ? ?  Chronic Care Management ?Pharmacy Assistant  ? ?Name: [name]  MRN: [id number] DOB: Aug 02, 1940 ? ?Kristin Kramer is an 81 y.o. year old female who presents for his follow-up CCM visit with the clinical pharmacist. ? ?Reason for Encounter: Reminder Call ?  ?Conditions to be addressed/monitored: ?HLD and Anxiety ? ?Medications: ?Outpatient Encounter Medications as of 05/17/2021  ?Medication Sig  ? busPIRone (BUSPAR) 5 MG tablet TAKE 1 TABLET (5 MG TOTAL) BY MOUTH 2 (TWO) TIMES DAILY AS NEEDED (ANXIETY).  ? aspirin EC 81 MG tablet Take 81 mg by mouth daily. Swallow whole.  ? calcium carbonate (OSCAL) 1500 (600 Ca) MG TABS tablet Take 600 mg of elemental calcium by mouth daily with breakfast.  ? Cholecalciferol (VITAMIN D-3) 5000 units TABS Take 5,000 Units by mouth daily.   ? Coenzyme Q10 (COQ10 PO) Take 1 capsule by mouth daily.   ? donepezil (ARICEPT) 5 MG tablet TAKE 1 TABLET BY MOUTH EVERY DAY  ? ezetimibe (ZETIA) 10 MG tablet TAKE 1 TABLET BY MOUTH EVERY DAY  ? fexofenadine (ALLEGRA ALLERGY) 180 MG tablet Take 1 tablet (180 mg total) by mouth daily.  ? hydrochlorothiazide (MICROZIDE) 12.5 MG capsule Take 1 capsule (12.5 mg total) by mouth daily.  ? levothyroxine (SYNTHROID) 25 MCG tablet TAKE 1 TABLET BY MOUTH EVERY DAY BEFORE BREAKFAST  ? losartan (COZAAR) 25 MG tablet Take 1 tablet (25 mg total) by mouth daily.  ? METAMUCIL FIBER PO Take by mouth daily.  ? niacinamide 500 MG tablet Take 1 tablet (500 mg total) by mouth daily.  ? omeprazole (PRILOSEC) 40 MG capsule TAKE 1 CAPSULE (40 MG TOTAL) BY MOUTH DAILY. FOR 2-3 WEEKS THEN AS NEEDED FOR HEARTBURN  ? Potassium 99 MG TABS Take 1 tablet (99 mg total) by mouth daily.  ? prednisoLONE acetate (PRED FORTE) 1 % ophthalmic suspension PLEASE SEE ATTACHED FOR DETAILED DIRECTIONS (Patient not taking: Reported on 04/19/2021)  ? Probiotic Product (PROBIOTIC PO) Take 1 capsule by mouth daily.   ? sertraline (ZOLOFT) 25 MG tablet Take 1.5 tablets (37.5 mg  total) by mouth at bedtime.  ? traMADol (ULTRAM) 50 MG tablet Take 1 tablet (50 mg total) by mouth every 8 (eight) hours as needed for moderate pain.  ? TURMERIC PO Take 900 mg by mouth daily.   ? ?No facility-administered encounter medications on file as of 05/17/2021.  ? ?[name]  did not answer the telephone to remind of upcoming telephone visit with [name] on 05/20/21 at 3:45pm. Patient was reminded to have any blood glucose and blood pressure readings available for review at appointment. If unable to reach, a voicemail was left for patient.  ? ? ? ?Star Rating Drugs: ?Medication:  Last Fill: Day Supply ?Losartan '25mg'$  02/26/21 90 ? ?[name], CPP notified ? ?Giuliana Handyside, CCMA ?Health concierge  ?[phone]  ?

## 2021-05-18 DIAGNOSIS — L438 Other lichen planus: Secondary | ICD-10-CM | POA: Diagnosis not present

## 2021-05-18 DIAGNOSIS — Z85828 Personal history of other malignant neoplasm of skin: Secondary | ICD-10-CM | POA: Diagnosis not present

## 2021-05-20 ENCOUNTER — Other Ambulatory Visit: Payer: Self-pay

## 2021-05-20 ENCOUNTER — Ambulatory Visit (INDEPENDENT_AMBULATORY_CARE_PROVIDER_SITE_OTHER): Payer: Medicare Other | Admitting: Pharmacist

## 2021-05-20 DIAGNOSIS — F330 Major depressive disorder, recurrent, mild: Secondary | ICD-10-CM

## 2021-05-20 DIAGNOSIS — M8580 Other specified disorders of bone density and structure, unspecified site: Secondary | ICD-10-CM

## 2021-05-20 DIAGNOSIS — E785 Hyperlipidemia, unspecified: Secondary | ICD-10-CM

## 2021-05-20 DIAGNOSIS — I10 Essential (primary) hypertension: Secondary | ICD-10-CM

## 2021-05-20 DIAGNOSIS — F411 Generalized anxiety disorder: Secondary | ICD-10-CM

## 2021-05-20 NOTE — Progress Notes (Signed)
? ?Chronic Care Management ?Pharmacy Note ? ?05/21/2021 ?Name:  Kristin Kramer MRN:  [id number] DOB:  October 11, 1940 ? ?Summary: CCM F/U visit ?-Pt reports improvement in shakiness/tremors in hands since reducing sertraline back to 37.5 mg daily. She reports improvement in anxiety after starting buspirone last month. ?-Discussed osteopenia, pt is high risk for fractures and would benefit from pharmacological treatment. Pt will think about this and discuss with PCP ?-Pt is not using omeprazole daily, only PRN for symptoms. Discussed PPI bone density risks ? ?Recommendations/Changes made from today's visit: ?-Advised trial off omeprazole due to bone density risks, use famotidine 20 mg daily instead ? ?Plan: ?-[facility] will call patient 3 months for BP log ?-Pharmacist follow up televisit scheduled for 6 months ?-PCP visit 09/10/21 (AWV) ? ? ? ?Subjective: ?Kristin Kramer is an 81 y.o. year old female who is a primary patient of [name], MD.  The CCM team was consulted for assistance with disease management and care coordination needs.   ? ?Engaged with patient by telephone for follow up visit in response to provider referral for pharmacy case management and/or care coordination services.  ? ?Consent to Services:  ?The patient was given information about Chronic Care Management services, agreed to services, and gave verbal consent prior to initiation of services.  Please see initial visit note for detailed documentation.  ? ?Patient Care Team: ?[name], MD as PCP - General (Family Medicine) ?[name], OD as Consulting Physician (Optometry) ?Jaicee Michelotti, [place], [facility] as Pharmacist (Pharmacist) ? ?Recent office visits: ?03/20/21 - [name], MD, PCP - Mychart msg - Your bone density scan returned showing stable osteopenia - however with increased risk of fracture. For this reason we should consider medication to strengthen bones like weekly fosamax or prolia injection  every 6 months. If interested, schedule an office visit to review treatment options.  ? ?02/08/21 - [name], MD, PCP - Pt presented for mild cognitive impairment. Increase sertraline to 50 mg. Refer to counseling. Work towards better anxiety management, reassess memory afterwards. Discussed recent rib fractures. Recommend scheduled tylenol 5102m BID to TID and continue tramadol PRN breakthrough pain. Follow up 6 months. ? ?02/03/21 - SOwens Loffler MD - Pt presented for rib pain. Continue tramadol PRN.  ?09/08/20 - Pt presented for Medicare AWV. Pt notices progressive difficulty with memory which is concerning. Will refer for neurocognitive evaluation.  ? ?Recent consult visits: ?12/30/20 - Neurology - Pt presented for mild cognitive impairment. Discussed results.  ?12/23/20 - Neurology - Recommend see PCP for anxiety/depression treatment.  ? ?Hospital visits: ?None in previous 6 months ? ? ?Objective: ? ?Lab Results  ?Component Value Date  ? CREATININE 0.86 09/01/2020  ? BUN 21 09/01/2020  ? GFR 64.19 09/01/2020  ? GFRNONAA >60 08/08/2019  ? GFRAA >60 08/08/2019  ? NA 140 09/01/2020  ? K 4.7 09/01/2020  ? CALCIUM 10.4 09/01/2020  ? CO2 29 09/01/2020  ? GLUCOSE 93 09/01/2020  ? ? ?Lab Results  ?Component Value Date/Time  ? HGBA1C 5.7 07/10/2019 11:18 AM  ? GFR 64.19 09/01/2020 08:14 AM  ? GFR 74.62 05/11/2020 02:36 PM  ?  ? ?Lab Results  ?Component Value Date  ? CHOL 213 (H) 09/01/2020  ? HDL 58.50 09/01/2020  ? L[place]130 (H) 09/01/2020  ? LDLDIRECT 146.4 08/13/2012  ? TRIG 120.0 09/01/2020  ? CHOLHDL 4 09/01/2020  ? ? ?Hepatic Function Latest Ref Rng & Units 09/01/2020 08/29/2019 07/29/2019  ?Total Protein 6.0 - 8.3 g/dL 7.1 7.3  7.7  ?Albumin 3.5 - 5.2 g/dL 4.6 4.5 4.4  ?AST 0 - 37 U/L 24 28 23   ?ALT 0 - 35 U/L 21 22 24   ?Alk Phosphatase 39 - 117 U/L 111 255(H) 110  ?Total Bilirubin 0.2 - 1.2 mg/dL 0.4 0.6 0.4  ? ? ?Lab Results  ?Component Value Date/Time  ? TSH 3.41 09/01/2020 08:14 AM  ? TSH 3.37  08/29/2019 08:10 AM  ? FREET4 1.12 08/29/2019 08:10 AM  ? FREET4 0.66 12/17/2018 08:13 AM  ? ? ?CBC Latest Ref Rng & Units 09/01/2020 08/29/2019 08/08/2019  ?WBC 4.0 - 10.5 K/uL 6.1 6.0 8.3  ?Hemoglobin 12.0 - 15.0 g/dL 13.4 12.5 10.4(L)  ?Hematocrit 36.0 - 46.0 % 39.7 37.9 32.7(L)  ?Platelets 150.0 - 400.0 K/uL 240.0 427.0(H) 185  ? ? ?Lab Results  ?Component Value Date/Time  ? VD25OH 60.76 09/01/2020 08:14 AM  ? VD25OH 40.80 08/29/2019 08:10 AM  ? ? ?Clinical ASCVD: No  ?The ASCVD Risk score (Arnett DK, et al., 2019) failed to calculate for the following reasons: ?  The 2019 ASCVD risk score is only valid for ages 72 to 68   ? ?Depression screen [facility] 2/9 09/08/2020 08/29/2019 08/15/2018  ?Decreased Interest 0 0 0  ?Down, Depressed, Hopeless 1 0 0  ?PHQ - 2 Score 1 0 0  ?Altered sleeping 2 0 0  ?Tired, decreased energy 3 0 0  ?Change in appetite 0 0 0  ?Feeling bad or failure about yourself  0 0 0  ?Trouble concentrating 3 0 0  ?Moving slowly or fidgety/restless 0 0 0  ?Suicidal thoughts 0 0 0  ?PHQ-9 Score 9 0 0  ?Difficult doing work/chores - Not difficult at all Not difficult at all  ?Some recent data might be hidden  ?  ?GAD 7 : Generalized Anxiety Score 09/08/2020  ?Nervous, Anxious, on Edge 2  ?Control/stop worrying 1  ?Worry too much - different things 3  ?Trouble relaxing 2  ?Restless 0  ?Easily annoyed or irritable 3  ?Afraid - awful might happen 3  ?Total GAD 7 Score 14  ? ? ?Social History  ? ?Tobacco Use  ?Smoking Status Never  ?Smokeless Tobacco Never  ? ?BP Readings from Last 3 Encounters:  ?02/08/21 120/80  ?02/03/21 120/76  ?09/08/20 112/68  ? ?Pulse Readings from Last 3 Encounters:  ?02/08/21 75  ?02/03/21 94  ?09/08/20 84  ? ?Wt Readings from Last 3 Encounters:  ?02/08/21 152 lb 9 oz (69.2 kg)  ?02/03/21 151 lb 4 oz (68.6 kg)  ?09/08/20 147 lb 8 oz (66.9 kg)  ? ?BMI Readings from Last 3 Encounters:  ?02/08/21 25.78 kg/m?  ?02/03/21 25.56 kg/m?  ?09/08/20 24.93 kg/m?  ? ? ?Assessment/Interventions: Review of  patient past medical history, allergies, medications, health status, including review of consultants reports, laboratory and other test data, was performed as part of comprehensive evaluation and provision of chronic care management services.  ? ?SDOH:  (Social Determinants of Health) assessments and interventions performed: Yes ? ? ?SDOH Screenings  ? ?Alcohol Screen: Not on file  ?Depression (PHQ2-9): Medium Risk  ? PHQ-2 Score: 9  ?Financial Resource Strain: Low Risk   ? Difficulty of Paying Living Expenses: Not hard at all  ?Food Insecurity: Not on file  ?Housing: Not on file  ?Physical Activity: Not on file  ?Social Connections: Not on file  ?Stress: Not on file  ?Tobacco Use: Low Risk   ? Smoking Tobacco Use: Never  ? Smokeless Tobacco Use: Never  ? Passive  Exposure: Not on file  ?Transportation Needs: No Transportation Needs  ? Lack of Transportation (Medical): No  ? Lack of Transportation (Non-Medical): No  ? ? ?CCM Care Plan ? ?Allergies  ?Allergen Reactions  ? Blood-Group Specific Substance   ? Lipitor [Atorvastatin] Other (See Comments)  ?  Cramps  ? Pravastatin Other (See Comments)  ?  Cramps   ? ? ?Medications Reviewed Today   ? ? Reviewed by Charlton Haws, [facility] (Pharmacist) on 05/21/21 at 1354  Med List Status: <None>  ? ?Medication Order Taking? Sig Documenting Provider Last Dose Status Informant  ?aspirin EC 81 MG tablet [id number] Yes Take 81 mg by mouth daily. Swallow whole. [name], MD Taking Active   ?         ?Med Note Ronaldo Miyamoto   Tue Jun 02, 2020  9:45 AM)    ?busPIRone (BUSPAR) 5 MG tablet [id number] Yes TAKE 1 TABLET (5 MG TOTAL) BY MOUTH 2 (TWO) TIMES DAILY AS NEEDED (ANXIETY). [name], MD Taking Active   ?calcium carbonate (OSCAL) 1500 (600 Ca) MG TABS tablet [id number] Yes Take 600 mg of elemental calcium by mouth daily with breakfast. [provider] Taking Active Self  ?Cholecalciferol (VITAMIN D-3) 5000 units TABS [id number] Yes Take 5,000 Units by  mouth daily.  [provider] Taking Active Self  ?Coenzyme Q10 (COQ10 PO) [id number] Yes Take 1 capsule by mouth daily.  [provider] Taking Active Self  ?donepezil (ARICEPT) 5 MG tablet [phone]

## 2021-05-21 NOTE — Patient Instructions (Signed)
Visit Information ? ?Phone number for Pharmacist: [phone] ? ? Goals Addressed   ? ?  ?  ?  ?  ? This Visit's Progress  ?  Track and Manage My Blood Pressure-Hypertension     ?  Timeframe:  Long-Range Goal ?Priority:  Medium ?Start Date:     05/21/21                        ?Expected End Date:    05/22/22                  ? ?Follow Up Date Sept 2023 ?  ?- check blood pressure 3 times per week ?- choose a place to take my blood pressure (home, clinic or office, retail store) ?- write blood pressure results in a log or diary  ?  ?Why is this important?   ?You won't feel high blood pressure, but it can still hurt your blood vessels.  ?High blood pressure can cause heart or kidney problems. It can also cause a stroke.  ?Making lifestyle changes like losing a little weight or eating less salt will help.  ?Checking your blood pressure at home and at different times of the day can help to control blood pressure.  ?If the doctor prescribes medicine remember to take it the way the doctor ordered.  ?Call the office if you cannot afford the medicine or if there are questions about it.   ?  ?Notes:  ?  ? ?  ? ? ?Care Plan : [place]  ?Updates made by Charlton Haws, RPH since 05/21/2021 12:00 AM  ?  ? ?Problem: Hypertension, Hyperlipidemia, GERD, Anxiety, and Osteopenia   ?Priority: High  ?  ? ?Long-Range Goal: Disease Management   ?Start Date: 04/19/2021  ?Expected End Date: 05/22/2022  ?This Visit's Progress: On track  ?Priority: High  ?Note:   ?Current Barriers:  ?Suboptimal therapeutic regimen for osteopenia, GERD ? ?Pharmacist Clinical Goal(s):  ?Patient will contact provider office for questions/concerns as evidenced notation of same in electronic health record through collaboration with PharmD and provider.  ? ?Interventions: ?1:1 collaboration with [name], MD regarding development and update of comprehensive plan of care as evidenced by provider attestation and  co-signature ?Inter-disciplinary care team collaboration (see longitudinal plan of care) ?Comprehensive medication review performed; medication list updated in electronic medical record ? ?Hypertension (BP goal <130/80) ?-Not ideally controlled - pt reports some low BP at home with dizziness; she denies falls; she has not followed up with cardiology since 05/2020 ?-Current home BP readings: 99/59 - 120/70 ?-Current treatment: ?HCTZ 12.5 mg daily - Appropriate, Effective, Query Safe ?Losartan 25 mg daily - Appropriate, Effective, Query Safe ?-Medications previously tried: n/a  ?-Educated on BP goals and benefits of medications for prevention of heart attack, stroke and kidney damage; Importance of home blood pressure monitoring; Symptoms of hypotension and importance of maintaining adequate hydration; ?-Counseled to monitor BP at home daily, document, and provide log at future appointments ?-Recommend to contact cardiology regarding low BP, schedule f/u appt ? ?Hyperlipidemia: (LDL goal < 100) ?-Controlled - LDL 130, adequate control for age, given statin intolerance and limited treatment options  ?-Current treatment: ?Ezetimibe 10 mg daily - Appropriate, Effective, Safe, Accessible ?OTC Niacin 500 mg daily - Query appropriate ?Coenzyme Q10 -Query appropriate ?Aspirin 81 mg daily - Appropriate, Effective, Safe, Accessible ?-Medications previously tried: pravastatin, atorvastatin  ?-Current dietary patterns: limits foods high in cholesterol ?-Current exercise habits: walks  three times weekly ?-Discussed niacin and CoQ10 and not likely to be beneficial; pt can stop taking this if she likes ?-Recommend to continue current medication ? ?Depression/Anxiety (Goal: 50% or greater reduction in PHQ-9 and GAD-7) ?-Controlled - pt reports significant improvement in anxiety since starting Buspar; she also reports tremors have improved since reducing sertraline back to 37.5 mg  ?-PHQ9: 9 (09/08/20) - mild depression ?-GAD7: 14  (09/08/20) - moderate anxiety ?-Current treatment: ?Sertraline 25 mg - 1.5 tab daily ?Buspirone 5 mg BID ?-Medications previously tried/failed: lexapro, celexa, wellbutrin ?-Educated on Benefits of medication for symptom control; Benefits of cognitive-behavioral therapy with or without medication ?-Recommend to continue current medication ? ?Osteopenia (Goal prevent fractures) ?-Not ideally controlled ?-Last DEXA Scan: 03/2021  ? T-Score femoral neck: -2.2 ? T-Score total hip: -2.2 ? T-Score lumbar spine: -1.4 ? 10-year probability of major osteoporotic fracture: 24.7% ? 10-year probability of hip fracture: 7.3% ?-Patient is a candidate for pharmacologic treatment due to T-Score -1.0 to -2.5 and 10-year risk of major osteoporotic fracture > 20% and T-Score -1.0 to -2.5 and 10-year risk of hip fracture > 3% ?-Current treatment  ?Calcium carbonate 600 mg daily ?Vitamin D 5000 IU ?-Medications previously tried: n/a  ?-Discussed she is a candidate for treatment; benefits of pharmacologic treatment to prevent fractures; pt will think about this and discuss with PCP ?- ?GERD (Goal: manage symptoms) ?-Not ideally controlled - pt reports occasional reflux; she is not taking omeprazole daily, only PRN for symptoms; she also uses OTC pepcid sometimes ?-Current treatment  ?Omeprazole 40 mg daily PRN ?Famotidine 20 mg PRN ?-Medications previously tried: n/a ?-Discussed bone density risks associated with PPI; advised to limit use of PPI and use famotidine instead ? ?Patient Goals/Self-Care Activities ?Patient will:  ?- take medications as prescribed as evidenced by patient report and record review ?focus on medication adherence by routine ?check blood pressure periodically, document, and provide at future appointments ?  ?  ? ?Patient verbalizes understanding of instructions and care plan provided today and agrees to view in [place]. Active MyChart status confirmed with patient.   ?Telephone follow up appointment with pharmacy team  member scheduled for: 6 months ? ?[name], PharmD, BCACP ?Clinical Pharmacist ?[facility] ?[phone] ?  ?

## 2021-05-26 ENCOUNTER — Other Ambulatory Visit: Payer: Self-pay | Admitting: Cardiovascular Disease

## 2021-05-26 ENCOUNTER — Telehealth: Payer: Self-pay | Admitting: Cardiovascular Disease

## 2021-05-26 ENCOUNTER — Other Ambulatory Visit: Payer: Self-pay

## 2021-05-26 MED ORDER — LOSARTAN POTASSIUM 25 MG PO TABS: 25.0000 mg | ORAL_TABLET | Freq: Every day | ORAL | 3 refills | Status: DC

## 2021-05-26 MED ORDER — HYDROCHLOROTHIAZIDE 12.5 MG PO CAPS: 12.5000 mg | ORAL_CAPSULE | Freq: Every day | ORAL | 3 refills | Status: DC

## 2021-05-26 NOTE — Telephone Encounter (Signed)
*  STAT* If patient is at the pharmacy, call can be transferred to refill team.   1. Which medications need to be refilled? (please list name of each medication and dose if known) Cozaar, Mydrochlorothiazide  2. Which pharmacy/location (including street and city if local pharmacy) is medication to be sent to?[facility]  3. Do they need a 30 day or 90 day supply? [place]

## 2021-05-26 NOTE — Progress Notes (Signed)
Cardiology Office Note ? ?Date:  05/27/2021  ? ?ID:  Kristin Kramer, DOB 09-22-1940, MRN [id number] ? ?PCP:  [name], MD  ? ?Chief Complaint  ?Patient presents with  ? Other  ?  Patient c.o of her balance bring off -- causing her to be lightheaded and her BP being up and down. Meds reviewed verbally with patient.   ? ? ?HPI:  ?Kristin Kramer is a very pleasant 81 year old woman with a history of hyperlipidemia, ?osteoarthritis of her knees, total knee replacement 01/02/2012,  ?Chest pain postoperatively ?previously presenting for abnormal EKG and preop eval 2013, ?Chronic muscle cramping x 1 year ?Who presents for routine follow-up of her exertional dyspnea ? ?Last seen in clinic by myself March 2022 ?In follow-up today reports doing well, ?Rare episodes of lightheadedness ?Feels more tired, especially in the afternoon ? ?Recent blood pressures reviewed with her ?3/16- 99/59 ?3/17-119/65 ?3/18-137/76 ?3/19-135/74 ?140/80 ? ?"BP up and down" ?Poor sleep, does not get a deep sleep ?No exercise, "too tired" ?Last year was walking, not now ?Used to walk with a friend up to 3 miles at a time ? ?One time after walking 99/59 ?Does not drink much fluids in general ? ?Lab work reviewed  June 2022 ?Total cholesterol 213  ?LDL 135 down from 176 ?On Zetia ?A1c 5.7 ? ?EKG personally reviewed by myself on todays visit ?Normal sinus rhythm with rate 64 bpm left axis deviation no change from prior EKGs ? ?Other past medical history reviewed ?CT scan May 2019 mild diffuse aortic atherosclerosis ? ?Chest pain postoperatively in 2013 after knee replacement surgery . Prior  echocardiogram  was normal, cardiac enzymes were normal.  ?No stress test was performed. ? ?Notes indicating in the past she did not tolerate red yeast rice, Lipitor, pravastatin. She had myalgias.  ? ?Family history ? father passed away at age 20 from lung cancer,  ?mother at age 17 for lung cancer. They were both smokers ? ? ? 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), Generalized anxiety disorder, GERD (gastroesophageal reflux disease), Herpes zoster (02/19/2014), History of pneumonia, 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), History of UTI, HLD (hyperlipidemia), Hypothyroidism, Knee osteoarthritis (2013, 2014), Lactose intolerance (02/08/2018), Leg cramping (08/15/2018), Major depressive disorder, Mesenteric lymphadenitis (2014), Migraines, Mild cognitive impairment (12/23/2020), Osteoarthritis of knee (03/18/2013), Osteopenia (08/2016), Postoperative nausea and vomiting, Pre-diabetes, Refusal of blood transfusions as patient is Jehovah's Witness, Right rib fracture (05/21/2014), 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 ? ?PSH:    ?Past Surgical History:  ?Procedure Laterality Date  ? AUGMENTATION MAMMAPLASTY    ? BREAST ENHANCEMENT SURGERY    ? BUNIONECTOMY  2013  ? R and L foot  ? CATARACT EXTRACTION Bilateral 2013  ? R, pending L  ? COLONOSCOPY  2007  ? no records received  ? COLONOSCOPY  08/2014  ? tubular adenoma, rpt 5 yrs [facility])  ? COLONOSCOPY  06/2020  ?  4 polyps removed, TA, no rpt recommended [facility])  ? CONVERSION TO TOTAL HIP Right  12/07/2017  ? Procedure: CONVERSION TO TOTAL HIP ( REMOVING SMITH AND NEPHEW NAIL);  Surgeon: Hessie Knows, MD;  Location: [facility];  Service: Orthopedics;  Laterality: Right;  ? COSMETIC SURGERY  2002  ? face lift  ? dexa  06/2008  ? T -1.4 spine and hip  ? ETHMOIDECTOMY Bilateral 05/19/2016  ? Procedure: ETHMOIDECTOMY;  Surgeon: Carloyn Manner, MD;  Location: [facility];  Service: ENT;  Laterality: Bilateral;  ? EYE SURGERY    ? bilateral cataract surgery  ? FRONTAL SINUS EXPLORATION Bilateral 05/19/2016  ? Procedure: FRONTAL SINUS EXPLORATION;  Surgeon: Carloyn Manner, MD;  Location: [facility];  Service: ENT;  Laterality: Bilateral;  ? INTRAMEDULLARY (IM) NAIL INTERTROCHANTERIC Right 07/08/2017  ? Procedure: RIGHT INTERTROCHANTRIC IM NAIL;  Surgeon: Leandrew Koyanagi, MD;  Location: [facility];  Service: Orthopedics;  Laterality: Right;  ? MAXILLARY ANTROSTOMY Bilateral 05/19/2016  ? Procedure: MAXILLARY ANTROSTOMY;  Surgeon: Carloyn Manner, MD;  Location: [facility];  Service: ENT;  Laterality: Bilateral;  ? MOHS SURGERY  2011  ? R leg  ? PARTIAL HYSTERECTOMY  1980  ? fibroids, ovaries remained  ? SINUS ENDO W/FUSION N/A 05/19/2016  ? Procedure: ENDOSCOPIC SINUS SURGERY WITH NAVIGATION;  Surgeon: Carloyn Manner, MD;  Location: [facility];  Service: ENT;  Laterality: N/A;  ? SPHENOIDECTOMY Bilateral 05/19/2016  ? Procedure: SPHENOIDECTOMY;  Surgeon: Carloyn Manner, MD;  Location: [facility];  Service: ENT;  Laterality: Bilateral;  ? SQUAMOUS CELL CARCINOMA EXCISION Right 07/13/2016  ? Dr. Rolm Bookbinder, [facility]  ? TONSILLECTOMY AND ADENOIDECTOMY  1945  ? TOTAL HIP REVISION Right 08/07/2019  ? Procedure: Right hip acetabular versus total hip arthroplasty revision-posterior;  Surgeon: Gaynelle Arabian, MD;  Location: [facility];  Service: Orthopedics;  Laterality: Right;  120mn  ? TOTAL KNEE ARTHROPLASTY  01/02/2012  ? Right - Surgeon: RLorn Junes MD  ? TOTAL KNEE ARTHROPLASTY Left 03/18/2013  ? Surgeon:  RLorn Junes MD  ? ? ?Current Outpatient Medications  ?Medication Sig Dispense Refill  ? aspirin EC 81 MG tablet Take 81 mg by mouth daily. Swallow whole.    ? busPIRone (BUSPAR) 5 MG tablet TAKE 1 TABLET (5 MG TOTAL) BY MOUTH 2 (TWO) TIMES DAILY AS NEEDED (ANXIETY). 180 tablet 1  ? calcium carbonate (OSCAL) 1500 (600 Ca) MG TABS tablet Take 600 mg of elemental calcium by mouth daily with breakfast.    ? Cholecalciferol (VITAMIN D-3) 5000 units TABS Take 5,000 Units by mouth daily.     ? Coenzyme Q10 (COQ10 PO) Take 1 capsule by mouth daily.     ? donepezil (ARICEPT) 5 MG tablet TAKE 1 TABLET BY MOUTH EVERY DAY 90 tablet 3  ? ezetimibe (ZETIA) 10 MG tablet TAKE 1 TABLET BY MOUTH EVERY DAY 90 tablet 0  ? famotidine (PEPCID) 20 MG tablet Take 20 mg by mouth daily as needed for heartburn or indigestion.    ? fexofenadine (ALLEGRA ALLERGY) 180 MG tablet Take 1 tablet (180 mg total) by mouth daily.    ? hydrochlorothiazide (MICROZIDE) 12.5 MG capsule Take 1 capsule (12.5 mg total) by mouth daily. 90 capsule 3  ? levothyroxine (SYNTHROID) 25 MCG tablet TAKE 1 TABLET BY MOUTH EVERY DAY BEFORE BREAKFAST 90 tablet 0  ? losartan (COZAAR) 25 MG tablet Take 1 tablet (25 mg total) by mouth daily. 90 tablet 3  ? METAMUCIL FIBER PO Take by mouth daily.    ?  niacinamide 500 MG tablet Take 1 tablet (500 mg total) by mouth daily.    ? Potassium 99 MG TABS Take 1 tablet (99 mg total) by mouth daily.    ? prednisoLONE acetate (PRED FORTE) 1 % ophthalmic suspension     ? Probiotic Product (PROBIOTIC PO) Take 1 capsule by mouth daily.     ? sertraline (ZOLOFT) 25 MG tablet Take 1.5 tablets (37.5 mg total) by mouth at bedtime. 135 tablet 0  ? TURMERIC PO Take 900 mg by mouth daily.     ? ?No current facility-administered medications for this visit.  ? ? ? ?Allergies:   Blood-group specific substance, Lipitor [atorvastatin], and Pravastatin  ? ?Social History:  The patient  reports that she has never smoked. She has never used  smokeless tobacco. She reports current alcohol use. She reports that she does not use drugs.  ? ?Family History:   family history includes Alzheimer's disease in her mother; Cancer in her father; Cancer (age of onset: 67)

## 2021-05-26 NOTE — Telephone Encounter (Signed)
Pt c/o BP issue: STAT if pt c/o blurred vision, one-sided weakness or slurred speech  1. What are your last 5 BP readings?  3/16- 99/59 3/17-119/65 3/18-137/76 3/19-135/74 Today-140/80  2. Are you having any other symptoms (ex. Dizziness, headache, blurred vision, passed out)? Lightheaded and tired, especially in the afternoon  3. What is your BP issue? elevated  Patient has a follow up appt in April

## 2021-05-26 NOTE — Telephone Encounter (Signed)
Was able to return call to Kristin Kramer, last OV was a year ago, does have upcoming appt in April with Dr. Rockey Situ. ? ?Reviewed pt's BP with her on phone, most readings were first thing in the morning, some before or after her BP meds. Advised to bring a log of her BP/HR with her to her appt for review, request her taking her BP while at rest and at least 2 hrs after her medications.  ? ?Currently taking  ?Losartan 25 mg in the morning ?Hydrochlorothiazide 12.5 mg in the morning ? ?For times of lightheadedness, advised to sit and rest for a good 15-30 mins, try to prevent falls when times of lightheadedness, change position slowly and rest. Also, try to increase hydration. If symptoms get worse, blacking bout, syncopal episodes, SOB, or CP, then please seek medical attention for evaluation. Kristin Kramer verbalized understanding. ? ?Otherwise all questions were address and no additional concerns at this time. Kristin Kramer thankful for the return call and advice. Agreeable to plan, will call back for anything further.   ? ?

## 2021-05-27 ENCOUNTER — Encounter: Payer: Self-pay | Admitting: Cardiovascular Disease

## 2021-05-27 ENCOUNTER — Ambulatory Visit: Payer: Medicare Other | Admitting: Cardiovascular Disease

## 2021-05-27 ENCOUNTER — Other Ambulatory Visit: Payer: Self-pay

## 2021-05-27 VITALS — BP 130/62 | Ht 65.0 in | Wt 152.0 lb

## 2021-05-27 DIAGNOSIS — R0600 Dyspnea, unspecified: Secondary | ICD-10-CM | POA: Diagnosis not present

## 2021-05-27 DIAGNOSIS — E782 Mixed hyperlipidemia: Secondary | ICD-10-CM

## 2021-05-27 DIAGNOSIS — M7989 Other specified soft tissue disorders: Secondary | ICD-10-CM | POA: Diagnosis not present

## 2021-05-27 DIAGNOSIS — I700 Atherosclerosis of aorta: Secondary | ICD-10-CM | POA: Diagnosis not present

## 2021-05-27 DIAGNOSIS — I10 Essential (primary) hypertension: Secondary | ICD-10-CM | POA: Diagnosis not present

## 2021-05-27 NOTE — Patient Instructions (Addendum)
Medication Instructions:  No changes  If you need a refill on your cardiac medications before your next appointment, please call your pharmacy.   Lab work: No new labs needed  Testing/Procedures: No new testing needed  Follow-Up: At [facility], you and your health needs are our priority.  As part of our continuing mission to provide you with exceptional heart care, we have created designated Provider Care Teams.  These Care Teams include your primary Cardiologist (physician) and Advanced Practice Providers (APPs -  Physician Assistants and Nurse Practitioners) who all work together to provide you with the care you need, when you need it.  You will need a follow up appointment as needed  Providers on your designated Care Team:   Christopher Berge, NP Ryan Dunn, PA-C Cadence Furth, PA-C  COVID-19 Vaccine Information can be found at: [email or web address] For questions related to vaccine distribution or appointments, please email [email or web address] or call [phone].    

## 2021-06-04 DIAGNOSIS — E785 Hyperlipidemia, unspecified: Secondary | ICD-10-CM | POA: Diagnosis not present

## 2021-06-04 DIAGNOSIS — F330 Major depressive disorder, recurrent, mild: Secondary | ICD-10-CM | POA: Diagnosis not present

## 2021-06-04 DIAGNOSIS — I10 Essential (primary) hypertension: Secondary | ICD-10-CM

## 2021-06-15 ENCOUNTER — Encounter: Payer: Self-pay | Admitting: Family Medicine

## 2021-06-29 ENCOUNTER — Ambulatory Visit: Payer: Medicare Other | Admitting: Cardiovascular Disease

## 2021-07-13 DIAGNOSIS — M1811 Unilateral primary osteoarthritis of first carpometacarpal joint, right hand: Secondary | ICD-10-CM | POA: Diagnosis not present

## 2021-07-21 DIAGNOSIS — L814 Other melanin hyperpigmentation: Secondary | ICD-10-CM | POA: Diagnosis not present

## 2021-07-21 DIAGNOSIS — L080 Pyoderma: Secondary | ICD-10-CM | POA: Diagnosis not present

## 2021-07-21 DIAGNOSIS — D485 Neoplasm of uncertain behavior of skin: Secondary | ICD-10-CM | POA: Diagnosis not present

## 2021-07-21 DIAGNOSIS — C44729 Squamous cell carcinoma of skin of left lower limb, including hip: Secondary | ICD-10-CM | POA: Diagnosis not present

## 2021-07-21 DIAGNOSIS — D2239 Melanocytic nevi of other parts of face: Secondary | ICD-10-CM | POA: Diagnosis not present

## 2021-07-21 DIAGNOSIS — L821 Other seborrheic keratosis: Secondary | ICD-10-CM | POA: Diagnosis not present

## 2021-07-21 DIAGNOSIS — L738 Other specified follicular disorders: Secondary | ICD-10-CM | POA: Diagnosis not present

## 2021-07-21 DIAGNOSIS — D1801 Hemangioma of skin and subcutaneous tissue: Secondary | ICD-10-CM | POA: Diagnosis not present

## 2021-07-21 DIAGNOSIS — Z85828 Personal history of other malignant neoplasm of skin: Secondary | ICD-10-CM | POA: Diagnosis not present

## 2021-07-21 DIAGNOSIS — D225 Melanocytic nevi of trunk: Secondary | ICD-10-CM | POA: Diagnosis not present

## 2021-07-21 DIAGNOSIS — L570 Actinic keratosis: Secondary | ICD-10-CM | POA: Diagnosis not present

## 2021-07-28 ENCOUNTER — Encounter: Payer: Self-pay | Admitting: Family Medicine

## 2021-07-29 ENCOUNTER — Other Ambulatory Visit: Payer: Self-pay | Admitting: Family Medicine

## 2021-08-05 ENCOUNTER — Telehealth: Payer: Self-pay

## 2021-08-05 NOTE — Chronic Care Management (AMB) (Signed)
Chronic Care Management Pharmacy Assistant   Name: Kristin Kramer  MRN: [id number] DOB: 28-Mar-1940  Reason for Encounter:  Hypertension Disease State  Recent office visits:  None since last CCM contact   Recent consult visits:  05/27/21-Timothy Gollan,MD(cardio)-unsteady gait, EKG-no medication changes  Hospital visits:  None in previous 6 months  Medications: Outpatient Encounter Medications as of 08/05/2021  Medication Sig   aspirin EC 81 MG tablet Take 81 mg by mouth daily. Swallow whole.   busPIRone (BUSPAR) 5 MG tablet TAKE 1 TABLET (5 MG TOTAL) BY MOUTH 2 (TWO) TIMES DAILY AS NEEDED (ANXIETY).   calcium carbonate (OSCAL) 1500 (600 Ca) MG TABS tablet Take 600 mg of elemental calcium by mouth daily with breakfast.   Cholecalciferol (VITAMIN D-3) 5000 units TABS Take 5,000 Units by mouth daily.    Coenzyme Q10 (COQ10 PO) Take 1 capsule by mouth daily.    donepezil (ARICEPT) 5 MG tablet TAKE 1 TABLET BY MOUTH EVERY DAY   ezetimibe (ZETIA) 10 MG tablet TAKE 1 TABLET BY MOUTH EVERY DAY   famotidine (PEPCID) 20 MG tablet Take 20 mg by mouth daily as needed for heartburn or indigestion.   fexofenadine (ALLEGRA ALLERGY) 180 MG tablet Take 1 tablet (180 mg total) by mouth daily.   hydrochlorothiazide (MICROZIDE) 12.5 MG capsule Take 1 capsule (12.5 mg total) by mouth daily.   levothyroxine (SYNTHROID) 25 MCG tablet TAKE 1 TABLET BY MOUTH EVERY DAY BEFORE BREAKFAST   losartan (COZAAR) 25 MG tablet Take 1 tablet (25 mg total) by mouth daily.   METAMUCIL FIBER PO Take by mouth daily.   niacinamide 500 MG tablet Take 1 tablet (500 mg total) by mouth daily.   Potassium 99 MG TABS Take 1 tablet (99 mg total) by mouth daily.   prednisoLONE acetate (PRED FORTE) 1 % ophthalmic suspension    Probiotic Product (PROBIOTIC PO) Take 1 capsule by mouth daily.    sertraline (ZOLOFT) 25 MG tablet Take 1.5 tablets (37.5 mg total) by mouth at bedtime.   TURMERIC PO Take 900 mg by mouth daily.     No facility-administered encounter medications on file as of 08/05/2021.    Recent Office Vitals: BP Readings from Last 3 Encounters:  05/27/21 130/62  02/08/21 120/80  02/03/21 120/76   Pulse Readings from Last 3 Encounters:  02/08/21 75  02/03/21 94  09/08/20 84    Wt Readings from Last 3 Encounters:  05/27/21 152 lb (68.9 kg)  02/08/21 152 lb 9 oz (69.2 kg)  02/03/21 151 lb 4 oz (68.6 kg)     Kidney Function Lab Results  Component Value Date/Time   CREATININE 0.86 09/01/2020 08:14 AM   CREATININE 0.76 05/11/2020 02:36 PM   CREATININE 0.95 12/31/2012 05:16 AM   CREATININE 0.7 06/24/2011 12:00 AM   GFR 64.19 09/01/2020 08:14 AM   GFRNONAA >60 08/08/2019 02:40 AM   GFRNONAA >60 12/31/2012 05:16 AM   GFRAA >60 08/08/2019 02:40 AM   GFRAA >60 12/31/2012 05:16 AM       Latest Ref Rng & Units 09/01/2020    8:14 AM 05/11/2020    2:36 PM 08/29/2019    8:10 AM  BMP  Glucose 70 - 99 mg/dL 93   108   95    BUN 6 - 23 mg/dL '21   17   16    '$ Creatinine 0.40 - 1.20 mg/dL 0.86   0.76   0.64    Sodium 135 - 145 mEq/L 140   141  140    Potassium 3.5 - 5.1 mEq/L 4.7   4.3   4.3    Chloride 96 - 112 mEq/L 101   102   100    CO2 19 - 32 mEq/L '29   30   26    '$ Calcium 8.4 - 10.5 mg/dL 10.4   9.6   10.1       Contacted patient on 08/11/21 to discuss hypertension disease state  Current antihypertensive regimen:    No pharmacotherapy    Patient verbally confirms she is taking the above medications as directed. Yes  How often are you checking your Blood Pressure? infrequently   Current home BP readings:    The patient has not bee taking home BP, encouraged to start and document on a log    DATE:             BP               PULSE  05/27/21 130/62   - Office setting     Wrist or arm cuff: arm cuff at home  Caffeine intake: none Salt intake:limits adding to food Over the counter medications including pseudoephedrine or NSAIDs? No -uses allegra  Any readings above 180/120?  No- Patient reports good visit with cardiology and MD said not to make any changes. The patient has not been taking BP regularly at home.   What recent interventions/DTPs have been made by any provider to improve Blood Pressure control since last CPP Visit: Patient encouraged to keep a BP log . GI irritation improved.Advised trial off omeprazole due to bone density risks, use famotidine 20 mg daily instead   Any recent hospitalizations or ED visits since last visit with CPP? No  What diet changes have been made to improve Blood Pressure Control?  Low salt intake.patient chooses healthy vegetables  What exercise is being done to improve your Blood Pressure Control?  Patient walks for exercise 3 days a week regular  Adherence Review: Is the patient currently on ACE/ARB medication? Yes Does the patient have >5 day gap between last estimated fill dates? No   Star Rating Drugs:  Medication:  Last Fill: Day Supply Losartan '25mg'$  05/26/21 90   Care Gaps: Annual wellness visit in last year? Yes Most Recent BP reading:130/62  05/27/21   Upcoming appointments: CCM appointment on 11/12/21   [name], CPP notified  Avel Sensor, [place]  [phone]

## 2021-08-11 ENCOUNTER — Ambulatory Visit: Payer: Medicare Other

## 2021-08-11 ENCOUNTER — Telehealth: Payer: Self-pay | Admitting: Family Medicine

## 2021-08-11 NOTE — Telephone Encounter (Signed)
Pt called about missing an appointment this morning for the pharmacist, pt states it says it was suppose to be (face to face), but she is not sure it if was suppose to be over the phone. Please give her a call to confirm what type of appointment it was suppose to be.   Callback Number: [phone]

## 2021-08-12 ENCOUNTER — Other Ambulatory Visit: Payer: Self-pay | Admitting: Family Medicine

## 2021-08-12 NOTE — Telephone Encounter (Signed)
Donepezil Last filled:  05/15/21, #90 Last OV:  02/08/21, neuro f/u Next OV:  09/10/21, CPE

## 2021-08-13 NOTE — Telephone Encounter (Signed)
ERx 

## 2021-08-18 ENCOUNTER — Other Ambulatory Visit: Payer: Self-pay | Admitting: Family Medicine

## 2021-08-18 DIAGNOSIS — Z1231 Encounter for screening mammogram for malignant neoplasm of breast: Secondary | ICD-10-CM

## 2021-08-29 ENCOUNTER — Other Ambulatory Visit: Payer: Self-pay | Admitting: Family Medicine

## 2021-08-29 DIAGNOSIS — E559 Vitamin D deficiency, unspecified: Secondary | ICD-10-CM

## 2021-08-29 DIAGNOSIS — E039 Hypothyroidism, unspecified: Secondary | ICD-10-CM

## 2021-08-29 DIAGNOSIS — R7303 Prediabetes: Secondary | ICD-10-CM

## 2021-08-29 DIAGNOSIS — E785 Hyperlipidemia, unspecified: Secondary | ICD-10-CM

## 2021-08-30 ENCOUNTER — Ambulatory Visit: Payer: Medicare Other

## 2021-08-31 ENCOUNTER — Ambulatory Visit
Admission: RE | Admit: 2021-08-31 | Discharge: 2021-08-31 | Disposition: A | Discharge status: Home / Self Care | Payer: Medicare Other | Source: Ambulatory Visit | Attending: Family Medicine | Admitting: Family Medicine

## 2021-08-31 DIAGNOSIS — Z1231 Encounter for screening mammogram for malignant neoplasm of breast: Secondary | ICD-10-CM

## 2021-09-02 ENCOUNTER — Other Ambulatory Visit (INDEPENDENT_AMBULATORY_CARE_PROVIDER_SITE_OTHER): Payer: Medicare Other

## 2021-09-02 DIAGNOSIS — E559 Vitamin D deficiency, unspecified: Secondary | ICD-10-CM | POA: Diagnosis not present

## 2021-09-02 DIAGNOSIS — E785 Hyperlipidemia, unspecified: Secondary | ICD-10-CM | POA: Diagnosis not present

## 2021-09-02 DIAGNOSIS — E039 Hypothyroidism, unspecified: Secondary | ICD-10-CM

## 2021-09-02 DIAGNOSIS — R7303 Prediabetes: Secondary | ICD-10-CM | POA: Diagnosis not present

## 2021-09-02 LAB — COMPREHENSIVE METABOLIC PANEL
ALT: 17 U/L (ref 0–35)
AST: 21 U/L (ref 0–37)
Albumin: 4.6 g/dL (ref 3.5–5.2)
Alkaline Phosphatase: 105 U/L (ref 39–117)
BUN: 21 mg/dL (ref 6–23)
CO2: 31 mEq/L (ref 19–32)
Calcium: 9.8 mg/dL (ref 8.4–10.5)
Chloride: 100 mEq/L (ref 96–112)
Creatinine, Ser: 0.73 mg/dL (ref 0.40–1.20)
GFR: 77.6 mL/min (ref >60.00)
Glucose, Bld: 106 mg/dL — ABNORMAL HIGH (ref 70–99)
Potassium: 4.1 mEq/L (ref 3.5–5.1)
Sodium: 139 mEq/L (ref 135–145)
Total Bilirubin: 0.6 mg/dL (ref 0.2–1.2)
Total Protein: 7.1 g/dL (ref 6.0–8.3)

## 2021-09-02 LAB — LIPID PANEL
Cholesterol: 226 mg/dL — ABNORMAL HIGH (ref 0–200)
HDL: 64.5 mg/dL (ref >39.00)
LDL Cholesterol: 134 mg/dL — ABNORMAL HIGH (ref 0–99)
NonHDL: 161.57
Total CHOL/HDL Ratio: 4
Triglycerides: 140 mg/dL (ref 0.0–149.0)
VLDL: 28 mg/dL (ref 0.0–40.0)

## 2021-09-02 LAB — HEMOGLOBIN A1C: Hgb A1c MFr Bld: 5.8 % (ref 4.6–6.5)

## 2021-09-02 LAB — TSH: TSH: 5.7 u[IU]/mL — ABNORMAL HIGH (ref 0.35–5.50)

## 2021-09-02 LAB — VITAMIN D 25 HYDROXY (VIT D DEFICIENCY, FRACTURES): VITD: 70.15 ng/mL (ref 30.00–100.00)

## 2021-09-10 ENCOUNTER — Ambulatory Visit (INDEPENDENT_AMBULATORY_CARE_PROVIDER_SITE_OTHER): Payer: Medicare Other | Admitting: Family Medicine

## 2021-09-10 ENCOUNTER — Encounter: Payer: Self-pay | Admitting: Family Medicine

## 2021-09-10 VITALS — BP 122/68 | HR 74 | Temp 97.2°F | Ht 64.5 in | Wt 147.4 lb

## 2021-09-10 DIAGNOSIS — R748 Abnormal levels of other serum enzymes: Secondary | ICD-10-CM

## 2021-09-10 DIAGNOSIS — M1811 Unilateral primary osteoarthritis of first carpometacarpal joint, right hand: Secondary | ICD-10-CM | POA: Diagnosis not present

## 2021-09-10 DIAGNOSIS — E785 Hyperlipidemia, unspecified: Secondary | ICD-10-CM | POA: Diagnosis not present

## 2021-09-10 DIAGNOSIS — F330 Major depressive disorder, recurrent, mild: Secondary | ICD-10-CM

## 2021-09-10 DIAGNOSIS — E039 Hypothyroidism, unspecified: Secondary | ICD-10-CM

## 2021-09-10 DIAGNOSIS — Z7189 Other specified counseling: Secondary | ICD-10-CM

## 2021-09-10 DIAGNOSIS — F411 Generalized anxiety disorder: Secondary | ICD-10-CM

## 2021-09-10 DIAGNOSIS — M85859 Other specified disorders of bone density and structure, unspecified thigh: Secondary | ICD-10-CM | POA: Diagnosis not present

## 2021-09-10 DIAGNOSIS — N393 Stress incontinence (female) (male): Secondary | ICD-10-CM

## 2021-09-10 DIAGNOSIS — E559 Vitamin D deficiency, unspecified: Secondary | ICD-10-CM

## 2021-09-10 DIAGNOSIS — G3184 Mild cognitive impairment, so stated: Secondary | ICD-10-CM

## 2021-09-10 DIAGNOSIS — Z0000 Encounter for general adult medical examination without abnormal findings: Secondary | ICD-10-CM | POA: Diagnosis not present

## 2021-09-10 DIAGNOSIS — Z96641 Presence of right artificial hip joint: Secondary | ICD-10-CM | POA: Diagnosis not present

## 2021-09-10 DIAGNOSIS — K219 Gastro-esophageal reflux disease without esophagitis: Secondary | ICD-10-CM

## 2021-09-10 DIAGNOSIS — R7303 Prediabetes: Secondary | ICD-10-CM

## 2021-09-10 DIAGNOSIS — Z531 Procedure and treatment not carried out because of patient's decision for reasons of belief and group pressure: Secondary | ICD-10-CM

## 2021-09-10 HISTORY — DX: Unilateral primary osteoarthritis of first carpometacarpal joint, right hand: M18.11

## 2021-09-10 HISTORY — DX: Stress incontinence (female) (male): N39.3

## 2021-09-10 MED ORDER — MAGNESIUM 250 MG PO TABS: 1.0000 | ORAL_TABLET | Freq: Every day | ORAL | 0 refills | Status: AC

## 2021-09-10 MED ORDER — EZETIMIBE 10 MG PO TABS: 10.0000 mg | ORAL_TABLET | Freq: Every day | ORAL | 3 refills | Status: DC

## 2021-09-10 MED ORDER — BUSPIRONE HCL 5 MG PO TABS: 5.0000 mg | ORAL_TABLET | Freq: Two times a day (BID) | ORAL | 3 refills | Status: DC | PRN

## 2021-09-10 MED ORDER — LEVOTHYROXINE SODIUM 50 MCG PO TABS: 50.0000 ug | ORAL_TABLET | Freq: Every day | ORAL | 3 refills | Status: DC

## 2021-09-10 MED ORDER — SERTRALINE HCL 25 MG PO TABS: 37.5000 mg | ORAL_TABLET | Freq: Every day | ORAL | 3 refills | Status: DC

## 2021-09-10 NOTE — Assessment & Plan Note (Signed)
Discussed pathophysiology of stress incontinence as well as kegel exercises, PFPT, vaginal pessary use, and surgical management. She is interested in PFPT - will refer.

## 2021-09-10 NOTE — Assessment & Plan Note (Signed)
DMV handicap placard application provided given intermittent pain with prolonged walking.

## 2021-09-10 NOTE — Assessment & Plan Note (Signed)
Avoid added sugar in diet.

## 2021-09-10 NOTE — Assessment & Plan Note (Signed)
Preventative protocols reviewed and updated unless pt declined. Discussed healthy diet and lifestyle.  

## 2021-09-10 NOTE — Assessment & Plan Note (Signed)
Describes this, states xrays through chiropractor showed this as well. Discussed supportive measures to include turmeric, topical creams including voltaren, will let me know if desires further evaluation by hand surgeon.

## 2021-09-10 NOTE — Assessment & Plan Note (Addendum)
Chronic, adequate on zetia and niacin. Statin intolerance.  The ASCVD Risk score (Arnett DK, et al., 2019) failed to calculate for the following reasons:   The 2019 ASCVD risk score is only valid for ages 17 to 63

## 2021-09-10 NOTE — Assessment & Plan Note (Addendum)
Stable period on sertraline 37.'5mg'$  daily and buspar '5mg'$  bid.

## 2021-09-10 NOTE — Assessment & Plan Note (Addendum)
TSH mildly elevated - will increase levothyroxine to 26mg daily.

## 2021-09-10 NOTE — Assessment & Plan Note (Signed)
Overall stable period. Stop aricept.

## 2021-09-10 NOTE — Progress Notes (Addendum)
Patient ID: Kristin Kramer, female    DOB: 08/19/1940, 81 y.o.   MRN: [id number]  This visit was conducted in person.  BP 122/68   Pulse 74   Temp (!) 97.2 F (36.2 C) (Temporal)   Ht 5' 4.5" (1.638 m)   Wt 147 lb 6 oz (66.8 kg)   SpO2 97%   BMI 24.91 kg/m    CC: AMW/CPE Subjective:   HPI: Kristin Kramer is a 81 y.o. female presenting on 09/10/2021 for Medicare Wellness   Did not see health advisor this year.   Hearing Screening   '500Hz'$  '1000Hz'$  '2000Hz'$  '4000Hz'$   Right ear 0 25 20 40  Left ear 0 40 20 40  Vision Screening - Comments:: Last eye exam, 05/2021.  [place] Office Visit from 09/10/2021 in [facility]  PHQ-2 Total Score 2          09/10/2021    8:28 AM 09/08/2020    9:54 AM 08/29/2019    9:53 AM 08/15/2018   11:33 AM 08/09/2017   10:22 AM  Fall Risk   Falls in the past year? 0 0 0 1 Yes  Comment    fell in parking lot; fractures to ribs and stitches to face   Number falls in past yr:   0 0 1  Injury with Fall?   0 1 Yes  Risk for fall due to :   Medication side effect    Follow up   Falls evaluation completed;Falls prevention discussed     Improved tremor since dropping sertraline dose. Anxiety improved on buspar. Using pepcid in place of omeprazole PRN reflux.   Notes worsening arthritis to R 1st CMC thumb joint, s/p xray done by chiropractor. She is right handed. This is affecting writing, playing cards, blow drying hair. She's been using lidocreme, arnica. Requests further evaluation.   S/p neurocognitive evaluation by Dr Melvyn Novas 01/2021 - dx MCI of unclear etiology. To consider rpt eval in 2-3 yrs, sooner if worsening functional decline.   Notes ongoing difficulty with R hip when flared (h/o recurrent surgeries for this) as well as lower back - requests handicap placard application.  Notes severe nocturnal leg cramps. May try magnesium/mustard.   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  Planning to reschedule.  Colonoscopy 06/09/2020 - 4 polyps removed, TA, no rpt recommended [facility]).  Well woman exam - 2012 by prior PCP. Hysterectomy for fibroids, ovaries remained. Menopause early 72s. no pelvic pain or vaginal bleeding. Recent CT with normal adnexal region.  [facility]  DEXA 08/2016 -2.4 hip, -1.4 spine  DEXA 08/2018 - T -2.1 at hip, increased hip fx risk (4.6%).  DEXA 03/2021 - L femur neck -2.2 with increased risk of fracture.  Continues vit D 5000 IU daily as well as calcium '600mg'$  daily and regular weight bearing exercise. Has never been on bone strengthening medication. Discussed bisphosphonate - declines for now.  Lung cancer screening - not eligible  Flu shot yearly  [place] 04/2019, 05/2019, booster x2 12/2019, 07/2020, bivalent 01/2021  Pneumovax 2013, prevnar-13 2016  Tdap 10/2014  zostavax - 02/2013  Shingrix - 11/2016, 10/2017  Advanced directives - reviewed and scanned 08/2017. Lists Kristin Kramer and [employer] as HCPOA. Jehova's witness - no blood transfusions but would want minor fractions of blood discussed with her. Does not want life prolonged if hopeless situation. Doesn't think would want compression/intubation.  Seat belt  use discussed Sunscreen use discussed. No changing moles on skin. Dr Ubaldo Glassing retired - now Microbiologist.  Non smoker  Alcohol - occasional  Dentist - Q6 months Eye exam - yearly  Bowel - no constipation  Bladder - ongoing urinary incontinence day and night, progressive over the past 2 years, predominant stress incontinence symptoms, wears pad. Previously saw urology. Never had bladder surgery. S/p hysterectomy.    Lives alone. Brother lives nearby. Occupation: retired, was Network engineer and housewife   Edu: 1 yr college   Activity: limited by knee osteoarthritis, [place] [employer] [place]. Diet: good water, fruits/vegetables daily     Relevant past medical, surgical, family and social history  reviewed and updated as indicated. Interim medical history since our last visit reviewed. Allergies and medications reviewed and updated. Outpatient Medications Prior to Visit  Medication Sig Dispense Refill   aspirin EC 81 MG tablet Take 81 mg by mouth daily. Swallow whole.     calcium carbonate (OSCAL) 1500 (600 Ca) MG TABS tablet Take 600 mg of elemental calcium by mouth daily with breakfast.     Cholecalciferol (VITAMIN D-3) 5000 units TABS Take 5,000 Units by mouth daily.      Coenzyme Q10 (COQ10 PO) Take 1 capsule by mouth daily.      famotidine (PEPCID) 20 MG tablet Take 20 mg by mouth daily as needed for heartburn or indigestion.     fexofenadine (ALLEGRA ALLERGY) 180 MG tablet Take 1 tablet (180 mg total) by mouth daily.     METAMUCIL FIBER PO Take by mouth daily.     niacinamide 500 MG tablet Take 1 tablet (500 mg total) by mouth daily.     Potassium 99 MG TABS Take 1 tablet (99 mg total) by mouth daily.     prednisoLONE acetate (PRED FORTE) 1 % ophthalmic suspension      Probiotic Product (PROBIOTIC PO) Take 1 capsule by mouth daily.      TURMERIC PO Take 900 mg by mouth daily.      busPIRone (BUSPAR) 5 MG tablet TAKE 1 TABLET (5 MG TOTAL) BY MOUTH 2 (TWO) TIMES DAILY AS NEEDED (ANXIETY). 180 tablet 1   donepezil (ARICEPT) 5 MG tablet TAKE 1 TABLET BY MOUTH EVERY DAY 90 tablet 0   ezetimibe (ZETIA) 10 MG tablet TAKE 1 TABLET BY MOUTH EVERY DAY 90 tablet 0   levothyroxine (SYNTHROID) 25 MCG tablet TAKE 1 TABLET BY MOUTH EVERY DAY BEFORE BREAKFAST 90 tablet 0   sertraline (ZOLOFT) 25 MG tablet Take 1.5 tablets (37.5 mg total) by mouth at bedtime. 135 tablet 0   hydrochlorothiazide (MICROZIDE) 12.5 MG capsule Take 1 capsule (12.5 mg total) by mouth daily. 90 capsule 3   losartan (COZAAR) 25 MG tablet Take 1 tablet (25 mg total) by mouth daily. 90 tablet 3   No facility-administered medications prior to visit.     Per HPI unless specifically indicated in ROS section below Review of  Systems  Constitutional:  Negative for activity change, appetite change, chills, fatigue, fever and unexpected weight change.  HENT:  Negative for hearing loss.   Eyes:  Negative for visual disturbance.  Respiratory:  Negative for cough, chest tightness, shortness of breath and wheezing.   Cardiovascular:  Negative for chest pain, palpitations and leg swelling.  Gastrointestinal:  Negative for abdominal distention, abdominal pain, blood in stool, constipation, diarrhea, nausea and vomiting.  Genitourinary:  Negative for difficulty urinating and hematuria.  Musculoskeletal:  Negative for arthralgias, myalgias and neck pain.  Leg cramps  Skin:  Negative for rash.  Neurological:  Negative for dizziness, seizures, syncope and headaches.  Hematological:  Negative for adenopathy. Does not bruise/bleed easily.  Psychiatric/Behavioral:  Negative for dysphoric mood. The patient is not nervous/anxious.     Objective:  BP 122/68   Pulse 74   Temp (!) 97.2 F (36.2 C) (Temporal)   Ht 5' 4.5" (1.638 m)   Wt 147 lb 6 oz (66.8 kg)   SpO2 97%   BMI 24.91 kg/m   Wt Readings from Last 3 Encounters:  09/10/21 147 lb 6 oz (66.8 kg)  05/27/21 152 lb (68.9 kg)  02/08/21 152 lb 9 oz (69.2 kg)      Physical Exam Vitals and nursing note reviewed.  Constitutional:      Appearance: Normal appearance. She is not ill-appearing.  HENT:     Head: Normocephalic and atraumatic.     Right Ear: Tympanic membrane, ear canal and external ear normal. There is no impacted cerumen.     Left Ear: Tympanic membrane, ear canal and external ear normal. There is no impacted cerumen.  Eyes:     General:        Right eye: No discharge.        Left eye: No discharge.     Extraocular Movements: Extraocular movements intact.     Conjunctiva/sclera: Conjunctivae normal.     Pupils: Pupils are equal, round, and reactive to light.  Neck:     Thyroid: No thyroid mass or thyromegaly.     Vascular: No carotid bruit.   Cardiovascular:     Rate and Rhythm: Normal rate and regular rhythm.     Pulses: Normal pulses.     Heart sounds: Normal heart sounds. No murmur heard. Pulmonary:     Effort: Pulmonary effort is normal. No respiratory distress.     Breath sounds: Normal breath sounds. No wheezing, rhonchi or rales.  Abdominal:     General: Bowel sounds are normal. There is no distension.     Palpations: Abdomen is soft. There is no mass.     Tenderness: There is no abdominal tenderness. There is no guarding or rebound.     Hernia: No hernia is present.  Musculoskeletal:     Cervical back: Normal range of motion and neck supple. No rigidity.     Right lower leg: No edema.     Left lower leg: No edema.  Lymphadenopathy:     Cervical: No cervical adenopathy.  Skin:    General: Skin is warm and dry.     Findings: No rash.  Neurological:     General: No focal deficit present.     Mental Status: She is alert. Mental status is at baseline.     Comments:  Recall 3/3 Calculation 5/5 DLROW  Psychiatric:        Mood and Affect: Mood normal.        Behavior: Behavior normal.       Results for orders placed or performed in visit on 09/02/21  Hemoglobin A1c  Result Value Ref Range   Hgb A1c MFr Bld 5.8 4.6 - 6.5 %  VITAMIN D 25 Hydroxy (Vit-D Deficiency, Fractures)  Result Value Ref Range   VITD 70.15 30.00 - 100.00 ng/mL  Lipid panel  Result Value Ref Range   Cholesterol 226 (H) 0 - 200 mg/dL   Triglycerides 140.0 0.0 - 149.0 mg/dL   HDL 64.50 >39.00 mg/dL   VLDL 28.0 0.0 - 40.0 mg/dL  LDL Cholesterol 134 (H) 0 - 99 mg/dL   Total CHOL/HDL Ratio 4    NonHDL 161.57   Comprehensive metabolic panel  Result Value Ref Range   Sodium 139 135 - 145 mEq/L   Potassium 4.1 3.5 - 5.1 mEq/L   Chloride 100 96 - 112 mEq/L   CO2 31 19 - 32 mEq/L   Glucose, Bld 106 (H) 70 - 99 mg/dL   BUN 21 6 - 23 mg/dL   Creatinine, Ser 0.73 0.40 - 1.20 mg/dL   Total Bilirubin 0.6 0.2 - 1.2 mg/dL   Alkaline  Phosphatase 105 39 - 117 U/L   AST 21 0 - 37 U/L   ALT 17 0 - 35 U/L   Total Protein 7.1 6.0 - 8.3 g/dL   Albumin 4.6 3.5 - 5.2 g/dL   GFR 77.60 >60.00 mL/min   Calcium 9.8 8.4 - 10.5 mg/dL  TSH  Result Value Ref Range   TSH 5.70 (H) 0.35 - 5.50 uIU/mL      09/10/2021    9:36 AM 09/08/2020   10:38 AM 08/29/2019    9:53 AM 08/15/2018   11:33 AM 08/09/2017   10:22 AM  Depression screen PHQ 2/9  Decreased Interest 1 0 0 0 0  Down, Depressed, Hopeless 1 1 0 0 0  PHQ - 2 Score 2 1 0 0 0  Altered sleeping 0 2 0 0 0  Tired, decreased energy 1 3 0 0 0  Change in appetite 1 0 0 0 0  Feeling bad or failure about yourself  0 0 0 0 0  Trouble concentrating 3 3 0 0 0  Moving slowly or fidgety/restless 0 0 0 0 0  Suicidal thoughts 0 0 0 0 0  PHQ-9 Score 7 9 0 0 0  Difficult doing work/chores Not difficult at all  Not difficult at all Not difficult at all Not difficult at all       09/10/2021    9:38 AM 09/08/2020   10:38 AM  GAD 7 : Generalized Anxiety Score  Nervous, Anxious, on Edge 0 2  Control/stop worrying 1 1  Worry too much - different things 1 3  Trouble relaxing 0 2  Restless 0 0  Easily annoyed or irritable 0 3  Afraid - awful might happen 0 3  Total GAD 7 Score 2 14  Anxiety Difficulty Not difficult at all    Assessment & Plan:   Problem List Items Addressed This Visit     Medicare annual wellness visit, subsequent - Primary (Chronic)    I have personally reviewed the Medicare Annual Wellness questionnaire and have noted 1. The patient's medical and social history 2. Their use of alcohol, tobacco or illicit drugs 3. Their current medications and supplements 4. The patient's functional ability including ADL's, fall risks, home safety risks and hearing or visual impairment. Cognitive function has been assessed and addressed as indicated.  5. Diet and physical activity 6. Evidence for depression or mood disorders The patients weight, height, BMI have been recorded in the  chart. I have made referrals, counseling and provided education to the patient based on review of the above and I have provided the pt with a written personalized care plan for preventive services. Provider list updated.. See scanned questionairre as needed for further documentation. Reviewed preventative protocols and updated unless pt declined.       Advanced care planning/counseling discussion (Chronic)    Advanced directives - reviewed and scanned 08/2017. Lists Sherlon Handing Terance Kramer  and [employer] as HCPOA. Jehova's witness - no blood transfusions but would want minor fractions of blood discussed with her. Does not want life prolonged if hopeless situation. Doesn't think would want compression/intubation.       Health maintenance examination (Chronic)    Preventative protocols reviewed and updated unless pt declined. Discussed healthy diet and lifestyle.       Major depressive disorder   Relevant Medications   busPIRone (BUSPAR) 5 MG tablet   sertraline (ZOLOFT) 25 MG tablet   Osteopenia    Reviewed latest DEXA showing increased risk of stress fracture - reviewed bisphosphonate benefits, use as well as risks - she declines commencement. Discussed cal, vit D and regular weight bearing exercises.       HLD (hyperlipidemia)    Chronic, adequate on zetia and niacin. Statin intolerance.  The ASCVD Risk score (Arnett DK, et al., 2019) failed to calculate for the following reasons:   The 2019 ASCVD risk score is only valid for ages 104 to 68       Relevant Medications   ezetimibe (ZETIA) 10 MG tablet   Vitamin D deficiency    Continue 5000 IU daily.       Elevated alkaline phosphatase level    This remains normal.      Transfusion of blood product refused for religious reason   History of total hip arthroplasty    DMV handicap placard application provided given intermittent pain with prolonged walking.      Hypothyroidism    TSH mildly elevated - will increase levothyroxine  to 69mg daily.       Relevant Medications   levothyroxine (SYNTHROID) 50 MCG tablet   GERD (gastroesophageal reflux disease)    Now off PPI, managed with PRN pepcid.       Generalized anxiety disorder    Stable period on sertraline 37.'5mg'$  daily and buspar '5mg'$  bid.       Relevant Medications   busPIRone (BUSPAR) 5 MG tablet   sertraline (ZOLOFT) 25 MG tablet   Pre-diabetes    Avoid added sugar in diet.       Mild cognitive impairment    Overall stable period. Stop aricept.       Urinary, incontinence, stress female    Discussed pathophysiology of stress incontinence as well as kegel exercises, PFPT, vaginal pessary use, and surgical management. She is interested in PFPT - will refer.       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 Discussed supportive measures to include turmeric, topical creams including voltaren, will let me know if desires further evaluation by hand surgeon.         Meds ordered this encounter  Medications   levothyroxine (SYNTHROID) 50 MCG tablet    Sig: Take 1 tablet (50 mcg total) by mouth daily before breakfast.    Dispense:  90 tablet    Refill:  3   busPIRone (BUSPAR) 5 MG tablet    Sig: Take 1 tablet (5 mg total) by mouth 2 (two) times daily as needed (anxiety).    Dispense:  180 tablet    Refill:  3   ezetimibe (ZETIA) 10 MG tablet    Sig: Take 1 tablet (10 mg total) by mouth daily.    Dispense:  90 tablet    Refill:  3   sertraline (ZOLOFT) 25 MG tablet  Sig: Take 1.5 tablets (37.5 mg total) by mouth at bedtime.    Dispense:  135 tablet    Refill:  3   Magnesium 250 MG TABS    Sig: Take 1 tablet (250 mg total) by mouth daily.    Refill:  0   Orders Placed This Encounter  Procedures   Ambulatory referral to Physical Therapy    Referral Priority:   Routine    Referral Type:    Physical Medicine    Referral Reason:   Specialty Services Required    Requested Specialty:   Physical Therapy    Number of Visits Requested:   1    Patient instructions: For R thumb arthritis - try topical voltaren gel over the counter, continue turmeric, consider osteo bi flex supplement. Let me know if interested in seeing hand surgeon for further evaluation.  For stress incontinence- we will refer you to pelvic floor physical therapy - at [facility]. Let me know if interested in GYN referral to discuss vaginal pessary.  Thyroid was underactive - increase levothyroxine to 32mg daily. New dose at pharmacy. Stop aricept.  Good to see you today. Return as needed or in 1 year for next physical.   Follow up plan: Return in about 1 year (around 09/11/2022) for annual exam, prior fasting for blood work, medicare wellness visit.  JRia Bush MD

## 2021-09-10 NOTE — Assessment & Plan Note (Signed)
Continue 5000 IU daily.

## 2021-09-10 NOTE — Assessment & Plan Note (Signed)
This remains normal.

## 2021-09-10 NOTE — Assessment & Plan Note (Signed)
Reviewed latest DEXA showing increased risk of stress fracture - reviewed bisphosphonate benefits, use as well as risks - she declines commencement. Discussed cal, vit D and regular weight bearing exercises.

## 2021-09-10 NOTE — Assessment & Plan Note (Signed)
Advanced directives - reviewed and scanned 08/2017. Lists Kristin Kramer and [employer] as HCPOA. Jehova's witness - no blood transfusions but would want minor fractions of blood discussed with her. Does not want life prolonged if hopeless situation. Doesn't think would want compression/intubation.

## 2021-09-10 NOTE — Patient Instructions (Addendum)
For R thumb arthritis - try topical voltaren gel over the counter, continue turmeric, consider osteo bi flex supplement. Let me know if interested in seeing hand surgeon for further evaluation.  For stress incontinence- we will refer you to pelvic floor physical therapy - at [place]. Let me know if interested in GYN referral to discuss vaginal pessary.  Thyroid was underactive - increase levothyroxine to 74mg daily. New dose at pharmacy. Stop aricept.  Good to see you today. Return as needed or in 1 year for next physical.   Health Maintenance After Age 6550After age 81 you are at a higher risk for certain long-term diseases and infections as well as injuries from falls. Falls are a major cause of broken bones and head injuries in people who are older than age 81 Getting regular preventive care can help to keep you healthy and well. Preventive care includes getting regular testing and making lifestyle changes as recommended by your health care provider. Talk with your health care provider about: Which screenings and tests you should have. A screening is a test that checks for a disease when you have no symptoms. A diet and exercise plan that is right for you. What should I know about screenings and tests to prevent falls? Screening and testing are the best ways to find a health problem early. Early diagnosis and treatment give you the best chance of managing medical conditions that are common after age 81 Certain conditions and lifestyle choices may make you more likely to have a fall. Your health care provider may recommend: Regular vision checks. Poor vision and conditions such as cataracts can make you more likely to have a fall. If you wear glasses, make sure to get your prescription updated if your vision changes. Medicine review. Work with your health care provider to regularly review all of the medicines you are taking, including over-the-counter medicines. Ask your health care provider  about any side effects that may make you more likely to have a fall. Tell your health care provider if any medicines that you take make you feel dizzy or sleepy. Strength and balance checks. Your health care provider may recommend certain tests to check your strength and balance while standing, walking, or changing positions. Foot health exam. Foot pain and numbness, as well as not wearing proper footwear, can make you more likely to have a fall. Screenings, including: Osteoporosis screening. Osteoporosis is a condition that causes the bones to get weaker and break more easily. Blood pressure screening. Blood pressure changes and medicines to control blood pressure can make you feel dizzy. Depression screening. You may be more likely to have a fall if you have a fear of falling, feel depressed, or feel unable to do activities that you used to do. Alcohol use screening. Using too much alcohol can affect your balance and may make you more likely to have a fall. Follow these instructions at home: Lifestyle Do not drink alcohol if: Your health care provider tells you not to drink. If you drink alcohol: Limit how much you have to: 0-1 drink a day for women. 0-2 drinks a day for men. Know how much alcohol is in your drink. In the [place] one drink equals one 12 oz bottle of beer (355 mL), one 5 oz glass of wine (148 mL), or one 1 oz glass of hard liquor (44 mL). Do not use any products that contain nicotine or tobacco. These products include cigarettes, chewing tobacco, and vaping devices, such as e-cigarettes.  If you need help quitting, ask your health care provider. Activity  Follow a regular exercise program to stay fit. This will help you maintain your balance. Ask your health care provider what types of exercise are appropriate for you. If you need a cane or walker, use it as recommended by your health care provider. Wear supportive shoes that have nonskid soles. Safety  Remove any tripping  hazards, such as rugs, cords, and clutter. Install safety equipment such as grab bars in bathrooms and safety rails on stairs. Keep rooms and walkways well-lit. General instructions Talk with your health care provider about your risks for falling. Tell your health care provider if: You fall. Be sure to tell your health care provider about all falls, even ones that seem minor. You feel dizzy, tiredness (fatigue), or off-balance. Take over-the-counter and prescription medicines only as told by your health care provider. These include supplements. Eat a healthy diet and maintain a healthy weight. A healthy diet includes low-fat dairy products, low-fat (lean) meats, and fiber from whole grains, beans, and lots of fruits and vegetables. Stay current with your vaccines. Schedule regular health, dental, and eye exams. Summary Having a healthy lifestyle and getting preventive care can help to protect your health and wellness after age 21. Screening and testing are the best way to find a health problem early and help you avoid having a fall. Early diagnosis and treatment give you the best chance for managing medical conditions that are more common for people who are older than age 61. Falls are a major cause of broken bones and head injuries in people who are older than age 69. Take precautions to prevent a fall at home. Work with your health care provider to learn what changes you can make to improve your health and wellness and to prevent falls. This information is not intended to replace advice given to you by your health care provider. Make sure you discuss any questions you have with your health care provider. Document Revised: 07/13/2020 Document Reviewed: 07/13/2020 Elsevier Patient Education  [place].

## 2021-09-10 NOTE — Assessment & Plan Note (Signed)

## 2021-09-10 NOTE — Assessment & Plan Note (Signed)
Now off PPI, managed with PRN pepcid.

## 2021-09-24 ENCOUNTER — Ambulatory Visit: Payer: Medicare Other | Attending: Family Medicine

## 2021-09-24 DIAGNOSIS — R278 Other lack of coordination: Secondary | ICD-10-CM | POA: Insufficient documentation

## 2021-09-24 DIAGNOSIS — N393 Stress incontinence (female) (male): Secondary | ICD-10-CM | POA: Diagnosis not present

## 2021-09-24 DIAGNOSIS — M6281 Muscle weakness (generalized): Secondary | ICD-10-CM | POA: Diagnosis not present

## 2021-09-24 DIAGNOSIS — M6289 Other specified disorders of muscle: Secondary | ICD-10-CM | POA: Diagnosis not present

## 2021-09-24 NOTE — Therapy (Addendum)
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 left axis deviation;  b. 12/2011 Echo: EF 55-60%, no rwma, pasp 83mHg.   Abnormal EKG    Chronic LAFB, incomplete RBBB Has been previously cleared by cardiology for surgery ([place]ockey Situ2013, 2015)   Basal cell carcinoma 02/2021   R nose tip ([place])   Cervical neck pain with evidence of disc disease 08/05/2016   Last Assessment & Plan:  Ongoing cervical neck pain after fall. NSAIDs effective for relief. She has started seeing chiropractor. Reviewed ER workup including CT showing evidence of bulging disc and disc space loss.   Chronic nasal congestion 04/19/2017   Ongoing since sinus surgery 05/2016  S/p ENT and allergist eval.    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 Recovering well from this.   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   S/p bilateral knee replacement  Formatting of this note might be different from the original. 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 Whitworth/[place]inehelfer), R tibia x4+, L tibia x2, L dorsal hand   TMJ tenderness, right 02/08/2018   Tubular adenoma of colon 08/2014   Varicose veins of both lower extremities with pain 05/11/2020   Vitamin D deficiency    Past Surgical History:  Procedure Laterality Date   AUGMENTATION MAMMAPLASTY     BREAST ENHANCEMENT SURGERY     BUNIONECTOMY  2013   R and L foot   CATARACT EXTRACTION Bilateral 2013   R, pending L   COLONOSCOPY  2007   no records received   COLONOSCOPY  08/2014    tubular adenoma, rpt 5 yrs [facility] Plan)   COLONOSCOPY  06/2020   4 polyps removed, TA, no rpt recommended [facility])   CONVERSION TO TOTAL HIP Right 12/07/2017   Procedure: CONVERSION TO TOTAL HIP ( REMOVING SMITH AND NEPHEW NAIL);  Surgeon: Hessie Knows, MD;  Location: [facility];  Service: Orthopedics;  Laterality: Right;   COSMETIC SURGERY  2002   face lift   dexa  06/2008   T -1.4 spine and hip   ETHMOIDECTOMY Bilateral 05/19/2016   Procedure: ETHMOIDECTOMY;  Surgeon: Carloyn Manner, MD;  Location: [facility];  Service: ENT;  Laterality: Bilateral;   EYE SURGERY     bilateral cataract surgery   FRONTAL SINUS EXPLORATION Bilateral 05/19/2016   Procedure: FRONTAL SINUS EXPLORATION;  Surgeon: Carloyn Manner, MD;  Location: [facility];  Service: ENT;  Laterality: Bilateral;   INTRAMEDULLARY (IM) NAIL INTERTROCHANTERIC Right 07/08/2017   Procedure: RIGHT INTERTROCHANTRIC IM NAIL;  Surgeon: Leandrew Koyanagi, MD;  Location: [place];  Service: Orthopedics;  Laterality: Right;   MAXILLARY ANTROSTOMY Bilateral 05/19/2016   Procedure: MAXILLARY ANTROSTOMY;  Surgeon: Carloyn Manner, MD;  Location: [facility];  Service: ENT;  Laterality: Bilateral;   MOHS SURGERY  2011   R leg   PARTIAL HYSTERECTOMY  1980   fibroids, ovaries remained   SINUS ENDO W/FUSION N/A 05/19/2016   Procedure: ENDOSCOPIC SINUS SURGERY WITH NAVIGATION;  Surgeon: Carloyn Manner, MD;  Location: [facility];  Service: ENT;  Laterality: N/A;   SPHENOIDECTOMY Bilateral 05/19/2016   Procedure: Coralee Pesa;  Surgeon: Carloyn Manner, MD;  Location: [facility];  Service: ENT;  Laterality: Bilateral;   SQUAMOUS CELL CARCINOMA EXCISION Right 07/13/2016   Dr. Rolm Bookbinder, [facility]   TONSILLECTOMY AND [place] Right 08/07/2019   Procedure: Right hip acetabular versus total hip arthroplasty revision-posterior;  Surgeon: Gaynelle Arabian, MD;  Location: [facility];  Service: Orthopedics;  Laterality: Right;   119mn   TOTAL KNEE ARTHROPLASTY  01/02/2012   Right - Surgeon: RLorn Junes MD   TOTAL KNEE ARTHROPLASTY Left 03/18/2013   Surgeon: RLorn Junes MD   Patient Active Problem List   Diagnosis Date Noted   Urinary, incontinence, stress female 09/10/2021   Osteoarthritis of carpometacarpal ([facility] joint of right thumb 09/10/2021   Left rib fracture 02/08/2021   Pre-diabetes 12/23/2020   Mild cognitive impairment 12/23/2020   Generalized anxiety disorder    Varicose veins of both lower extremities with pain 05/11/2020   Chronic rhinitis 03/10/2020   Elevated blood pressure reading without diagnosis of hypertension 03/10/2020   Health maintenance examination 09/05/2019   GERD (gastroesophageal reflux disease) 09/05/2019   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   Situational stress 07/10/2019   Osteopenia 09/03/2018   HLD (hyperlipidemia) 09/03/2018   Vitamin D deficiency 09/03/2018   Leg cramping 08/15/2018  Hypothyroidism 08/15/2018   TMJ tenderness, right 02/08/2018   Lactose intolerance 02/08/2018   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   Chronic nasal congestion 04/19/2017   Exertional dyspnea 04/19/2017   Cervical neck pain with evidence of disc disease 08/05/2016   Advanced care planning/counseling discussion 07/31/2014   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   Osteoarthritis of knee 03/18/2013   Postoperative nausea and vomiting    Transfusion of blood product refused for religious reason    Medicare annual wellness visit, subsequent 01/14/2013   Elevated alkaline phosphatase level 01/03/2013   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 Unexplained weight loss? Saddle anesthesia? Unexplained changes in bowel or bladder habits?   SUBJECTIVE: Patient confirms identification and approves PT to assess pelvic floor and treatment Yes                                                                                                                                                                                           PRECAUTIONS: None  WEIGHT BEARING RESTRICTIONS: No  FALLS:  Has patient fallen in last 6 months? No  OCCUPATION/SOCIAL ACTIVITIES: swimming, walking, dinner, playing cards  PLOF: Independent  PERTINENT HISTORY/CHART REVIEW: From 02/08/21 note with [name]: Diagnosed with mild cognitive impairment of unclear etiology, related to anxiety > depression. Not consistent of alzheimers or other neurodegenerative illness at this time.   12/30/20 note with [name] - Neurology - Pt presented for mild cognitive impairment. Discussed results.   CHIEF CONCERN: Pt has constant urinary leakage with coughing/laughing/sneezing/jumping. Pt has not noticed urinary leakage with walking, but her pantyliner is soiled enough where she needs to change it. Pt has had leakage more than 3 years ago but it is now more constant. Pt has had instances of bowel leakage, but not regularly. Pt has had to change undergarments because she feels it is just gas but it is not. Pt does notice when she goes to urinate and then wipes there is a bowel stain although she did not have a BM.    PAIN:  Are you having pain? Yes NPRS scale:  4/10 (current), 10/10 (worst) Pain location: low back pain   Pain type: aching Pain description: constant   Aggravating factors: prolonged sitting, household chores  Relieving factors: dry  needling. Chiropractor, heat   LIVING ENVIRONMENT: Lives with: lives alone, has a son in [place]  Lives in: House/apartment   PATIENT GOALS: Increase strength in the deep core, I would like to not leak and use of pantyliners or smelling like urine     UROLOGICAL HISTORY Fluid intake: flavored water (1-2/day), 2 cups of coffee   Pain with urination: No Fully empty bladder: No Stream: stop and go  Urgency: No Frequency: 5-6x Nocturia: 0x Leakage: Coughing, Sneezing, Laughing, Exercise, and Lifting Pads: Yes Type: pantyliners Amount: 3x/day, soiled, wearing through the night  Bladder control (0-10): 5/10   GASTROINTESTINAL HISTORY Leakage: Yes but very rarely  Pads: No    SEXUAL HISTORY/FUNCTION Pt has no concerns   OBSTETRICAL HISTORY Vaginal deliveries: G3P3 Tearing: Yes: stiches with first child    GYNECOLOGICAL HISTORY Hysterectomy: yes, partial hysterectomy  Pelvic Organ Prolapse: None Heaviness/pressure: no   OBJECTIVE:   COGNITION: Overall cognitive status: History of cognitive impairments - at baseline -see above in pertinent history/chart review    POSTURE:  In sitting, crossing of legs (increase PFM tension)  Lumbar lordosis:  Deferred 2/2 time constraints  Thoracic kyphosis: Iliac crest height:  Lumbar lateral shift:  Pelvic obliquity:  Leg length discrepancy:   GAIT: Deferred 2/2 time constraints  Distance walked: Comments:   Trendelenburg:   SENSATION: Deferred 2/2 time constraints  Light touch: , L2-S2 dermatomes  Proprioception:    RANGE OF MOTION:  Deferred 2/2 time constraints   (Norm range in degrees)  LEFT eval RIGHT eval  Lumbar forward flexion (65):      Lumbar extension (30):     Lumbar lateral flexion (25):     Thoracic and Lumbar rotation (30 degrees):       Hip Flexion (0-125):      Hip IR (0-45):     Hip ER (0-45):     Hip Adduction:      Hip Abduction (0-40):     Hip extension (0-15):     (*= pain, Blank  rows = not tested)   STRENGTH: MMT  Deferred 2/2 time constraints   RLE eval LLE eval  Hip Flexion    Hip Extension    Hip Abduction     Hip Adduction     Hip ER     Hip IR     Knee Extension    Knee Flexion    Dorsiflexion     Plantarflexion (seated)    (*= pain, Blank rows = not tested)   SPECIAL TESTS: Deferred 2/2 time constraints  Centralization and Peripheralization (SN 92, -LR 0.12):  Slump (SN 83, -LR 0.32):  SLR (SN 92, -LR 0.29): R: Lumbar quadrant (SN 70): R:  FABER (SN 81): FADIR (SN 94):  Hip scour (SN 50):  Thigh Thrust (SN 88, -LR 0.18) : Distraction (ZO10):  Compression (SN/SP 69): Stork/March (SP 93):   PHYSICAL PERFORMANCE MEASURES: Deferred 2/2 time constraints   STS:  RLE SLS:  LLE SLS:  6 MWT:  10MWT:  5TSTS:   PALPATION: Deferred 2/2 time constraints  Abdominal:  Diastasis:  finger above umbilicus,  fingers at and below umbilicus  Scar mobility: present/mobile perpendicular, parallel Rib flare: present/absent  EXTERNAL PELVIC EXAM: Patient educated on the purpose of the pelvic exam and articulated understanding; patient consented to the exam verbally. Deferred  2/2 time constraints  Palpation: Breath coordination: present/absent/inconsistent Voluntary Contraction: present/absent Relaxation: full/delayed/non-relaxing Perineal movement with sustained IAP increase ("bear down"): descent/no change/elevation/excessive descent Perineal movement with rapid IAP increase ("cough"): elevation/no change/descent Pubic symphysis: (0= no contraction, 1= flicker, 2= weak squeeze, 3= fair squeeze with lift, 4= good squeeze and lift against resistance, 5= strong squeeze against strong resistance)   INTERNAL PELVIC EXAM: Patient educated on the purpose of the pelvic exam and articulated understanding; patient consented to the exam verbally. Not applicable to Pt at this time.  Introitus Appears:  Skin integrity:  Scar mobility: Strength (PERF):   Symmetry: Palpation: Prolapse: (0= no contraction, 1= flicker, 2= weak squeeze, 3= fair squeeze with lift, 4= good squeeze and lift against resistance, 5= strong squeeze against strong resistance)     Patient Education:  Patient educated on what to expect during course of physical therapy, POC, and provided with HEP including: toileting posture handout and bladder irritants handout. Brief discussion on water intake for hydration, decreased bladder irritation, and as Pt is on various medications. Patient verbalized understanding and returned demonstration. Patient will benefit from further education in order to maximize compliance and understanding for long-term therapeutic gains.    Patient Surveys:  FOTO Urinary Problem - 45 56 FOTO Bowel Leakage - 41 55  FOTO Lumbar Spine - 53 55    ASSESSMENT:  Clinical Impression: Patient is a 81 y.o. who was seen today for physical therapy evaluation and treatment for a chief concern of urinary leakage. Today's evaluation suggest deficits in IAP management, PFM coordination, PFM endurance, PFM strength, posture, pain, as evidenced by crossing of legs throughout session (increase PFM tension), urinary leakage with sneezing/coughing/laughing/physical activity, worst LBP 10/10 (NPRS scale) that is constant and achy, use of pantyliner at night and it being soiled in the morning, use of pantyliners up to 3x/day, and occasional bowel leakage that occurs at most 1x/month. Patient's responses on FOTO Urinary Problem (45) and FOTO Lumbar Spine (53) indicates moderate limitation/disability/distress. Patient's progress may be limited due to baseline mild cognitive impairments and time since onset; however, patient's motivation is advantageous. Pt with basic understanding of PFM in bowel/bladder habits, sexual function, posture, and the deep core. Patient will benefit from skilled therapeutic intervention to address deficits in IAP management, PFM coordination, PFM  strength, posture, pain in order to increase PLOF and improve overall QOL.    Objective Impairments: decreased activity tolerance, decreased coordination, decreased endurance, decreased strength, improper body mechanics, postural dysfunction, and pain.   Activity Limitations: lifting, bending, squatting, continence, toileting, and locomotion level  Personal Factors: Age, Past/current experiences, Time since onset of injury/illness/exacerbation, and 3+ comorbidities: mild cognitive impairment, HTN, osteopenia  are also affecting patient's functional outcome.   Rehab Potential: Good  Clinical Decision Making: Evolving/moderate complexity  Evaluation Complexity: Moderate   GOALS: Goals reviewed with patient? Yes  SHORT TERM GOALS: Target date: 11/05/2021  Patient will demonstrate independent and coordinated diaphragmatic breathing in supine with a 1:2 breathing pattern for improved down-regulation of the nervous system and improved management of intra-abdominal pressures in order to increase function at home and in the community. Baseline: will assess next visit  Goal status: INITIAL    LONG TERM GOALS: Target date: 12/17/2021   Patient will decrease worst lumbar spine pain as reported on NPRS by at least 2 points to demonstrate clinically significant reduction in pain in order to restore/improve function and overall QOL. Baseline: 10/10  Goal status: INITIAL  2.  Patient will report being able  to return to activities including, but not limited to: walking, reading, household chores, and prolonged physical activity without pain or limitation to indicate improved core strengthening and return to prior level of participation at home and in the community. Baseline: moderate-quite a bit of difficulty performing household activities, sitting for long periods of time Goal status: INITIAL  3.  Patient will report decreased reliance on protective undergarments as indicated by a 24 hour period  to demonstrate improved bladder control and allow for increased participation in activities outside of the home. Baseline: pantyliners/3x per day  Goal status: INITIAL  4.  Patient will report less than 5 incidents of stress urinary incontinence over the course of 3 weeks while coughing/sneezing/laughing/prolonged activity in order to demonstrate improved PFM coordination, strength, and function for improved overall QOL. Baseline: leakage occurs every time with all the above Goal status: INITIAL  5.  Patient will report confidence in ability to control bladder > 7/10 in order to demonstrate improved function and ability to participate more fully in activities at home and in the community. Baseline: 5/10 Goal status: INITIAL    PLAN: PT Frequency: 1x/week  PT Duration: 12 weeks  Planned Interventions: Therapeutic exercises, Therapeutic activity, Neuromuscular re-education, Balance training, Gait training, Patient/Family education, Self Care, Joint mobilization, Cryotherapy, Moist heat, scar mobilization, Taping, and Manual therapy  Plan For Next Session: phys assess   Aila Terra, PT, DPT  09/24/2021, 11:31 AM

## 2021-09-30 ENCOUNTER — Ambulatory Visit: Payer: Medicare Other

## 2021-10-07 ENCOUNTER — Ambulatory Visit: Payer: Medicare Other | Attending: Family Medicine

## 2021-10-07 DIAGNOSIS — M6289 Other specified disorders of muscle: Secondary | ICD-10-CM | POA: Insufficient documentation

## 2021-10-07 DIAGNOSIS — R278 Other lack of coordination: Secondary | ICD-10-CM | POA: Diagnosis not present

## 2021-10-07 DIAGNOSIS — M6281 Muscle weakness (generalized): Secondary | ICD-10-CM | POA: Insufficient documentation

## 2021-10-07 NOTE — Therapy (Signed)
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 left axis deviation;  b. 12/2011 Echo: EF 55-60%, no rwma, pasp 91mHg.   Abnormal EKG    Chronic LAFB, incomplete RBBB Has been previously cleared by cardiology for surgery (Rockey Situ2013, 2015)   Basal cell carcinoma 02/2021   R nose tip ([place])   Cervical neck pain with evidence of disc disease 08/05/2016   Last Assessment & Plan:  Ongoing cervical neck pain after fall. NSAIDs effective for relief. She has started seeing chiropractor. Reviewed ER workup including CT showing evidence of bulging disc and disc space loss.   Chronic nasal congestion 04/19/2017   Ongoing since sinus surgery 05/2016  S/p ENT and allergist eval.    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 Recovering well from this.   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   S/p bilateral knee replacement  Formatting of this note might be different from the original. 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 Whitworth/[place]ine[place]), R tibia x4+, L tibia x2, L dorsal hand   TMJ tenderness, right 02/08/2018   Tubular adenoma of colon 08/2014   Varicose veins of both lower extremities with pain 05/11/2020   Vitamin D deficiency    Past Surgical History:  Procedure Laterality Date   AUGMENTATION MAMMAPLASTY     BREAST ENHANCEMENT SURGERY     BUNIONECTOMY  2013   R and L foot   CATARACT EXTRACTION Bilateral 2013   R, pending L   COLONOSCOPY  2007   no records received   COLONOSCOPY  08/2014   tubular adenoma, rpt 5 yrs Fuller Plan)    COLONOSCOPY  06/2020   4 polyps removed, TA, no rpt recommended [facility] Plan)   CONVERSION TO TOTAL HIP Right 12/07/2017   Procedure: CONVERSION TO TOTAL HIP ( REMOVING SMITH AND NEPHEW NAIL);  Surgeon: Hessie Knows, MD;  Location: [facility];  Service: Orthopedics;  Laterality: Right;   COSMETIC SURGERY  2002   face lift   dexa  06/2008   T -1.4 spine and hip   ETHMOIDECTOMY Bilateral 05/19/2016   Procedure: ETHMOIDECTOMY;  Surgeon: Carloyn Manner, MD;  Location: [facility];  Service: ENT;  Laterality: Bilateral;   EYE SURGERY     bilateral cataract surgery   FRONTAL SINUS EXPLORATION Bilateral 05/19/2016   Procedure: FRONTAL SINUS EXPLORATION;  Surgeon: Carloyn Manner, MD;  Location: [facility];  Service: ENT;  Laterality: Bilateral;   INTRAMEDULLARY (IM) NAIL INTERTROCHANTERIC Right 07/08/2017   Procedure: RIGHT INTERTROCHANTRIC IM NAIL;  Surgeon: Leandrew Koyanagi, MD;  Location: [facility];  Service: Orthopedics;  Laterality: Right;   MAXILLARY ANTROSTOMY Bilateral 05/19/2016   Procedure: MAXILLARY ANTROSTOMY;  Surgeon: Carloyn Manner, MD;  Location: [facility];  Service: ENT;  Laterality: Bilateral;   MOHS SURGERY  2011   R leg   PARTIAL HYSTERECTOMY  1980   fibroids, ovaries remained   SINUS ENDO W/FUSION N/A 05/19/2016   Procedure: ENDOSCOPIC SINUS SURGERY WITH NAVIGATION;  Surgeon: Carloyn Manner, MD;  Location: [facility];  Service: ENT;  Laterality: N/A;   SPHENOIDECTOMY Bilateral 05/19/2016   Procedure: Coralee Pesa;  Surgeon: Carloyn Manner, MD;  Location: [facility];  Service: ENT;  Laterality: Bilateral;   SQUAMOUS CELL CARCINOMA EXCISION Right 07/13/2016   Dr. Rolm Bookbinder, [facility]   TONSILLECTOMY AND [place] Right 08/07/2019   Procedure: Right hip acetabular versus total hip arthroplasty revision-posterior;  Surgeon: Gaynelle Arabian, MD;  Location: [facility];  Service: Orthopedics;  Laterality: Right;  146mn   TOTAL KNEE ARTHROPLASTY   01/02/2012   Right - Surgeon: RLorn Junes MD   TOTAL KNEE ARTHROPLASTY Left 03/18/2013   Surgeon: RLorn Junes MD   Patient Active Problem List   Diagnosis Date Noted   Urinary, incontinence, stress female 09/10/2021   Osteoarthritis of carpometacarpal ([facility] joint of right thumb 09/10/2021   Left rib fracture 02/08/2021   Pre-diabetes 12/23/2020   Mild cognitive impairment 12/23/2020   Generalized anxiety disorder    Varicose veins of both lower extremities with pain 05/11/2020   Chronic rhinitis 03/10/2020   Elevated blood pressure reading without diagnosis of hypertension 03/10/2020   Health maintenance examination 09/05/2019   GERD (gastroesophageal reflux disease) 09/05/2019   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   Situational stress 07/10/2019   Osteopenia 09/03/2018   HLD (hyperlipidemia) 09/03/2018   Vitamin D deficiency 09/03/2018   Leg cramping 08/15/2018   Hypothyroidism 08/15/2018   TMJ tenderness,  right 02/08/2018   Lactose intolerance 02/08/2018   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   Chronic nasal congestion 04/19/2017   Exertional dyspnea 04/19/2017   Cervical neck pain with evidence of disc disease 08/05/2016   Advanced care planning/counseling discussion 07/31/2014   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   Osteoarthritis of knee 03/18/2013   Postoperative nausea and vomiting    Transfusion of blood product refused for religious reason    Medicare annual wellness visit, subsequent 01/14/2013   Elevated alkaline phosphatase level 01/03/2013   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 No  OCCUPATION/SOCIAL ACTIVITIES: swimming, walking, dinner, playing cards  PLOF: Independent  PERTINENT HISTORY/CHART REVIEW: From 02/08/21 note with [name]: Diagnosed with mild cognitive impairment of unclear etiology, related to anxiety > depression. Not consistent of alzheimers or other neurodegenerative illness at this time.   12/30/20 note with [name] - Neurology - Pt presented for mild cognitive impairment. Discussed results.   CHIEF CONCERN: Pt has constant urinary leakage with coughing/laughing/sneezing/jumping. Pt has not noticed urinary leakage with walking, but her pantyliner is soiled enough where she needs to change it. Pt has had leakage more than 3 years ago but it is now more constant. Pt has had instances of bowel leakage, but not regularly. Pt has had to change undergarments because she feels it is just gas but it is not. Pt does notice when she goes to urinate and then wipes there is a bowel stain although she did not have a BM.   Pain type: aching Pain description: constant   Aggravating factors: prolonged sitting, household chores  Relieving factors: dry needling. Chiropractor, heat  LIVING ENVIRONMENT: Lives with: lives alone, has a son in [place]  Lives in: House/apartment   PATIENT GOALS: Increase strength in the deep core, I would like to not leak and use of pantyliners or smelling like urine     UROLOGICAL HISTORY Fluid intake: flavored water (1-2/day), 2 cups of coffee   Pain with urination: No Fully empty bladder: No Stream: stop and go  Urgency: No Frequency: 5-6x Nocturia: 0x Leakage: Coughing, Sneezing, Laughing, Exercise, and Lifting Pads: Yes Type: pantyliners Amount: 3x/day, soiled, wearing  through the night  Bladder control (0-10): 5/10   GASTROINTESTINAL HISTORY Leakage: Yes but very rarely  Pads: No    SEXUAL HISTORY/FUNCTION Pt has no concerns   OBSTETRICAL HISTORY Vaginal deliveries: G3P3 Tearing: Yes: stiches with first child    GYNECOLOGICAL HISTORY Hysterectomy: yes, partial hysterectomy  Pelvic Organ Prolapse: None Heaviness/pressure: no   SUBJECTIVE:  Pt has no changes from initial eval. Pt has tried to perform toileting with a stool and has not noticed a difference.              PAIN:  Are you having pain? No NPRS scale: 0/10 Pain location: low back pain     TODAY'S TREATMENT   Neuromuscular Re-education   Pre-treatment assessment  OBJECTIVE:   COGNITION: Overall cognitive status: History of cognitive impairments - at baseline -see above in pertinent history/chart review    POSTURE:  Iliac crest height: L iliac crest higher   Pelvic obliquity: L posteriorly rotated   SENSATION:  Light touch: intact, L2-S2 dermatomes     RANGE OF MOTION:    (Norm range in degrees)  LEFT 10/07/21 RIGHT 10/07/21  Lumbar forward flexion (65):  WNL    Lumbar extension (30): WNL    Lumbar lateral flexion (25):  WNL WNL  Thoracic and Lumbar rotation (30 degrees):    WNL WNL  Hip Flexion (0-125):   Restricted* WNL*  Hip IR (0-45):  WNL WNL  Hip ER (0-45):  WNL WNL  Hip Adduction:      Hip Abduction (0-40):  Restricted* WNL  Hip extension (0-15):     (*= pain, Blank rows = not tested)   STRENGTH: MMT    RLE 10/07/21 LLE 10/07/21  Hip Flexion 4 4  Hip Extension 4 4  Hip Abduction     Hip Adduction     Hip ER  5 4  Hip IR  5 4*  Knee Extension 5 5  Knee Flexion 4 4  Dorsiflexion     Plantarflexion (seated) 5 5  (*= pain, Blank rows = not tested)   SPECIAL TESTS:   FABER  (SN 81): negative B FADIR (SN 94): negative B  PALPATION:  Abdominal:  Diastasis:  2 fingers below umbilicus, Valsalva maneuver noted Rib flare: present B  EXTERNAL PELVIC EXAM: Patient educated on the purpose of the pelvic exam and articulated understanding; patient consented to the exam verbally. Deferred 2/2 time constraints  Palpation: Breath coordination: present/absent/inconsistent Voluntary Contraction: present/absent Relaxation: full/delayed/non-relaxing Perineal movement with sustained IAP increase ("bear down"): descent/no change/elevation/excessive descent Perineal movement with rapid IAP increase ("cough"): elevation/no change/descent Pubic symphysis: (0= no contraction, 1= flicker, 2= weak squeeze, 3= fair squeeze with lift, 4= good squeeze and lift against resistance, 5= strong squeeze against strong resistance)   Manual Therapy: Abdominal myofascial release for improved muscular tension and pain modulation   Bowel massage technique for improved mobility of bowels DPT demonstrated technique, will provide handout next session   Neuromuscular Re-education: Supine hooklying diaphragmatic breathing with VCs and TCs for downregulation of the nervous system and improved management of IAP  Seated diaphragmatic breathing with VCs and TCs for downregulation of the nervous system and improved management of IAP  Discussion and demonstration on log roll technique for improved IAP management and to decrease low back pain   Patient response to interventions: Pt felt the difference after manual technique as she was using her abdominal to take an inhalation through the nose   Patient Education:  Patient provided with HEP including: seated and supine diaphragmatic breathing. Patient educated throughout session on appropriate technique and form using multi-modal cueing, HEP, and activity modification. Patient will continue to benefit from further education in order to maximize  compliance and understanding for long-term therapeutic gains.    ASSESSMENT:  Clinical Impression: Patient with excellent motivation to participate in today's session. Upon physical examination, Pt demonstrates deficits in IAP management, PFM coordination, PFM endurance, PFM strength, posture, pain, and LE strength/ROM as evidenced by increased L iliac crest height, L posteriorly rotated innominate, Valsalva maneuver noted throughout session, restricted LE ROM with L hip flexion/abd and increased pain with both at the lateral hip/lower back, 4/5 MMT with B hip flex/hip ext/knee flex and L hip ER/IR, B rib flare,  and significant tension in the abdominal cavity upon palpation. Pt with discomfort at the RLQ but eased with increased time of manual technique. Pt required significant VCs and TCs for pursed-lip breathing as Pt voluntarily contracted obliques/rectus during inhalation. Pt demonstrated improvement at the end of session with decreased tension in the abdominal cavity. Pt responded positively to manual, active, and educational interventions. Patient will continue to benefit from skilled therapeutic intervention to address deficits in IAP management, PFM coordination, PFM endurance, PFM strength, posture, pain, and LE strength/ROM in order to increase PLOF and improve overall QOL.    Objective Impairments: decreased activity tolerance, decreased coordination, decreased endurance, decreased strength, improper body mechanics, postural dysfunction, and pain.   Activity Limitations: lifting, bending, squatting, continence, toileting, and locomotion level  Personal Factors: Age, Past/current experiences, Time since onset of injury/illness/exacerbation, and 3+ comorbidities: mild cognitive impairment, HTN, osteopenia  are also affecting patient's functional outcome.   Rehab Potential: Good  Clinical Decision Making: Evolving/moderate complexity  Evaluation Complexity: Moderate   GOALS: Goals  reviewed with patient? Yes  SHORT TERM GOALS: Target date: 11/18/2021  Patient will demonstrate independent and coordinated diaphragmatic breathing in supine with a 1:2 breathing pattern for improved down-regulation of the nervous system and improved management of intra-abdominal pressures in order to increase function at home and in the community. Baseline: (8/3): Valsalva maneuver throughout session and contracted rectus/obliques with inhalation Goal status: INITIAL    LONG TERM GOALS: Target date: 12/30/2021 (10/16)  Patient will decrease worst lumbar spine pain as reported on NPRS by at least 2 points to demonstrate clinically significant reduction in pain in order to restore/improve function and overall QOL. Baseline: 10/10  Goal status: INITIAL  2.  Patient will report being able to return to activities including, but not limited to: walking, reading, household chores, and prolonged physical activity without pain or limitation to indicate improved core strengthening and return to prior level of participation at home and in the community. Baseline: moderate-quite a bit of difficulty performing household activities, sitting for long periods of time Goal status: INITIAL  3.  Patient will report decreased reliance on protective undergarments as indicated by a 24 hour period to demonstrate improved bladder control and allow for increased participation in activities outside of the home. Baseline: pantyliners/3x per day  Goal status: INITIAL  4.  Patient will report less than 5 incidents of stress urinary incontinence over the course of 3 weeks while coughing/sneezing/laughing/prolonged activity in order to demonstrate improved PFM coordination, strength, and function for improved overall QOL. Baseline: leakage occurs every time with all the above Goal status: INITIAL  5.  Patient will report confidence in ability to control bladder > 7/10 in order to demonstrate improved function and  ability to participate more fully in activities at home and in the community. Baseline: 5/10 Goal status: INITIAL    PLAN: PT Frequency: 1x/week  PT Duration: 12 weeks  Planned Interventions: Therapeutic exercises, Therapeutic activity, Neuromuscular re-education, Balance training, Gait training, Patient/Family education, Self Care, Joint mobilization, Cryotherapy, Moist heat, scar mobilization, Taping, and Manual therapy  Plan For Next Session: PFM external, abdominal myofascial, IT band manual? Lower back lengthening    Oronde Hallenbeck, PT, DPT  10/07/2021, 8:48 AM

## 2021-10-14 ENCOUNTER — Ambulatory Visit: Payer: Medicare Other

## 2021-10-14 DIAGNOSIS — M6289 Other specified disorders of muscle: Secondary | ICD-10-CM

## 2021-10-14 DIAGNOSIS — R278 Other lack of coordination: Secondary | ICD-10-CM | POA: Diagnosis not present

## 2021-10-14 DIAGNOSIS — M6281 Muscle weakness (generalized): Secondary | ICD-10-CM

## 2021-10-14 NOTE — Therapy (Signed)
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 left axis deviation;  b. 12/2011 Echo: EF 55-60%, no rwma, pasp 44mHg.   Abnormal EKG    Chronic LAFB, incomplete RBBB Has been previously cleared by cardiology for surgery (Rockey Situ2013, 2015)   Basal cell carcinoma 02/2021   R nose tip ([place])   Cervical neck pain with evidence of disc disease 08/05/2016   Last Assessment & Plan:  Ongoing cervical neck pain after fall. NSAIDs effective for relief. She has started seeing chiropractor. Reviewed ER workup including CT showing evidence of bulging disc and disc space loss.   Chronic nasal congestion 04/19/2017   Ongoing since sinus surgery 05/2016  S/p ENT and allergist eval.    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 Recovering well from this.   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   S/p bilateral knee replacement  Formatting of this note might be different from the original. 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   Migraines    Mild cognitive impairment 12/23/2020   Osteoarthritis of knee 03/18/2013   Osteopenia 08/2016   hip -2.4, spine -1.4   Postoperative nausea and vomiting    Pre-diabetes    Refusal of blood transfusions as patient is Jehovah's Witness    Right rib fracture 05/21/2014   [facility]03/2013   [facility] vs sclerosing mesenteritis vs nonspecific lymphadenitis by CT (12/2012) Recurrent symptoms 06/2017 - CT consistent with sclerosing mesenteritis - treated with short prednisone taper Saw GI Dr SFuller Planthis week.    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 Whitworth/[place]ine[place]fer), R tibia x4+, L tibia x2, L dorsal hand   TMJ tenderness, right 02/08/2018   Tubular adenoma of colon 08/2014   Varicose veins of both lower extremities with pain 05/11/2020   Vitamin D deficiency    Past Surgical History:  Procedure Laterality Date   AUGMENTATION MAMMAPLASTY     BREAST ENHANCEMENT SURGERY     BUNIONECTOMY  2013   R and L foot   CATARACT EXTRACTION Bilateral 2013   R, pending L   COLONOSCOPY  2007   no records received   COLONOSCOPY  08/2014   tubular adenoma, rpt 5 yrs Fuller Plan)    COLONOSCOPY  06/2020   4 polyps removed, TA, no rpt recommended [facility] Plan)   CONVERSION TO TOTAL HIP Right 12/07/2017   Procedure: CONVERSION TO TOTAL HIP ( REMOVING SMITH AND NEPHEW NAIL);  Surgeon: Hessie Knows, MD;  Location: [facility] ORS;  Service: Orthopedics;  Laterality: Right;   COSMETIC SURGERY  2002   face lift   dexa  06/2008   T -1.4 spine and hip   ETHMOIDECTOMY Bilateral 05/19/2016   Procedure: ETHMOIDECTOMY;  Surgeon: Carloyn Manner, MD;  Location: [facility] ORS;  Service: ENT;  Laterality: Bilateral;   EYE SURGERY     bilateral cataract surgery   FRONTAL SINUS EXPLORATION Bilateral 05/19/2016   Procedure: FRONTAL SINUS EXPLORATION;  Surgeon: Carloyn Manner, MD;  Location: [facility] OR[facility];  Service: ENT;  Laterality: Bilateral;   INTRAMEDULLARY (IM) NAIL INTERTROCHANTERIC Right 07/08/2017   Procedure: RIGHT INTERTROCHANTRIC IM NAIL;  Surgeon: Leandrew Koyanagi, MD;  Location: [place];  Service: Orthopedics;  Laterality: Right;   MAXILLARY ANTROSTOMY Bilateral 05/19/2016   Procedure: MAXILLARY ANTROSTOMY;  Surgeon: Carloyn Manner, MD;  Location: [facility];  Service: ENT;  Laterality: Bilateral;   MOHS SURGERY  2011   R leg   PARTIAL HYSTERECTOMY  1980   fibroids, ovaries remained   SINUS ENDO W/FUSION N/A 05/19/2016   Procedure: ENDOSCOPIC SINUS SURGERY WITH NAVIGATION;  Surgeon: Carloyn Manner, MD;  Location: [facility];  Service: ENT;  Laterality: N/A;   SPHENOIDECTOMY Bilateral 05/19/2016   Procedure: Coralee Pesa;  Surgeon: Carloyn Manner, MD;  Location: [facility];  Service: ENT;  Laterality: Bilateral;   SQUAMOUS CELL CARCINOMA EXCISION Right 07/13/2016   Dr. Rolm Bookbinder, [facility]   TONSILLECTOMY AND [place] Right 08/07/2019   Procedure: Right hip acetabular versus total hip arthroplasty revision-posterior;  Surgeon: Gaynelle Arabian, MD;  Location: [facility];  Service: Orthopedics;  Laterality: Right;  139mn   TOTAL KNEE ARTHROPLASTY   01/02/2012   Right - Surgeon: RLorn Junes MD   TOTAL KNEE ARTHROPLASTY Left 03/18/2013   Surgeon: RLorn Junes MD   Patient Active Problem List   Diagnosis Date Noted   Urinary, incontinence, stress female 09/10/2021   Osteoarthritis of carpometacarpal ([facility] joint of right thumb 09/10/2021   Left rib fracture 02/08/2021   Pre-diabetes 12/23/2020   Mild cognitive impairment 12/23/2020   Generalized anxiety disorder    Varicose veins of both lower extremities with pain 05/11/2020   Chronic rhinitis 03/10/2020   Elevated blood pressure reading without diagnosis of hypertension 03/10/2020   Health maintenance examination 09/05/2019   GERD (gastroesophageal reflux disease) 09/05/2019   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   Situational stress 07/10/2019   Osteopenia 09/03/2018   HLD (hyperlipidemia) 09/03/2018   Vitamin D deficiency 09/03/2018   Leg cramping 08/15/2018   Hypothyroidism 08/15/2018   TMJ tenderness,  right 02/08/2018   Lactose intolerance 02/08/2018   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   Chronic nasal congestion 04/19/2017   Exertional dyspnea 04/19/2017   Cervical neck pain with evidence of disc disease 08/05/2016   Advanced care planning/counseling discussion 07/31/2014   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   Osteoarthritis of knee 03/18/2013   Postoperative nausea and vomiting    Transfusion of blood product refused for religious reason    Medicare annual wellness visit, subsequent 01/14/2013   Elevated alkaline phosphatase level 01/03/2013   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 No  OCCUPATION/SOCIAL ACTIVITIES: swimming, walking, dinner, playing cards  PLOF: Independent  PERTINENT HISTORY/CHART REVIEW: From 02/08/21 note with [name]: Diagnosed with mild cognitive impairment of unclear etiology, related to anxiety > depression. Not consistent of alzheimers or other neurodegenerative illness at this time.   12/30/20 note with [name] - Neurology - Pt presented for mild cognitive impairment. Discussed results.   CHIEF CONCERN: Pt has constant urinary leakage with coughing/laughing/sneezing/jumping. Pt has not noticed urinary leakage with walking, but her pantyliner is soiled enough where she needs to change it. Pt has had leakage more than 3 years ago but it is now more constant. Pt has had instances of bowel leakage, but not regularly. Pt has had to change undergarments because she feels it is just gas but it is not. Pt does notice when she goes to urinate and then wipes there is a bowel stain although she did not have a BM.   Pain type: aching Pain description: constant   Aggravating factors: prolonged sitting, household chores  Relieving factors: dry needling. Chiropractor, heat  LIVING ENVIRONMENT: Lives with: lives alone, has a son in [place]  Lives in: House/apartment   PATIENT GOALS: Increase strength in the deep core, I would like to not leak and use of pantyliners or smelling like urine     UROLOGICAL HISTORY Fluid intake: flavored water (1-2/day), 2 cups of coffee   Pain with urination: No Fully empty bladder: No Stream: stop and go  Urgency: No Frequency: 5-6x Nocturia: 0x Leakage: Coughing, Sneezing, Laughing, Exercise, and Lifting Pads: Yes Type: pantyliners Amount: 3x/day, soiled, wearing  through the night  Bladder control (0-10): 5/10   GASTROINTESTINAL HISTORY Leakage: Yes but very rarely  Pads: No    SEXUAL HISTORY/FUNCTION Pt has no concerns   OBSTETRICAL HISTORY Vaginal deliveries: G3P3 Tearing: Yes: stiches with first child    GYNECOLOGICAL HISTORY Hysterectomy: yes, partial hysterectomy  Pelvic Organ Prolapse: None Heaviness/pressure: no   SUBJECTIVE:  Pt said her Dr increased her thyroid medication. Pt has had really bad constipation the past few days and not sure why. She has been straining a lot.              PAIN:  Are you having pain? No NPRS scale: 0/10 Pain location: low back pain  OBJECTIVE:   COGNITION: Overall cognitive status: History of cognitive impairments - at baseline -see above in pertinent history/chart review    POSTURE:  Iliac crest height: L iliac crest higher   Pelvic obliquity: L posteriorly rotated   SENSATION:  Light touch: intact, L2-S2 dermatomes     RANGE OF MOTION:    (Norm range in degrees)  LEFT 10/07/21 RIGHT 10/07/21  Lumbar forward flexion (65):  WNL    Lumbar extension (30): WNL    Lumbar lateral flexion (25):  WNL WNL  Thoracic and Lumbar rotation (30 degrees):    WNL WNL  Hip Flexion (0-125):   Restricted* WNL*  Hip IR (0-45):  WNL WNL  Hip ER (0-45):  WNL WNL  Hip Adduction:      Hip Abduction (0-40):  Restricted* WNL  Hip extension (0-15):     (*= pain, Blank rows = not tested)   STRENGTH: MMT    RLE 10/07/21 LLE 10/07/21  Hip Flexion 4 4  Hip Extension 4 4  Hip Abduction     Hip Adduction     Hip ER  5 4  Hip IR  5 4*  Knee Extension 5 5  Knee Flexion 4 4  Dorsiflexion     Plantarflexion (seated) 5 5  (*= pain, Blank rows = not tested)   SPECIAL TESTS:   FABER (SN 81): negative B FADIR (SN 94): negative  B  PALPATION:  Abdominal:  Diastasis:  2 fingers below umbilicus, Valsalva maneuver noted Rib flare: present B   TODAY'S TREATMENT   Manual Therapy: Abdominal myofascial release for improved muscular tension and prepare for bowel abdominal technique  Some discomfort at both RLQ/LLQ which eased with continued intervention  Bowel massage technique for improved mobility of bowels DPT demonstrated technique and had Pt practice on self, handout given   Neuromuscular Re-education:  Pre-treatment assessment:  EXTERNAL PELVIC EXAM: Patient educated on the purpose of the pelvic exam and articulated understanding; patient consented to the exam verbally.  Breath coordination: inconsistent  Voluntary Contraction: present, 2/5 MMT but felt spasm of PFM Relaxation: delayed Perineal movement with sustained IAP increase ("bear down"): minimal descent, Valsalva maneuver noted Perineal movement with rapid IAP increase ("cough"): no change (0= no contraction, 1= flicker, 2= weak squeeze, 3= fair squeeze with lift, 4= good squeeze and lift against resistance, 5= strong squeeze against strong resistance)    Supine hooklying diaphragmatic breathing with VCs and TCs for downregulation of the nervous system and improved management of IAP Significant cueing to decrease activation on superficial abdominal musculature on inhalation   Discussion on PFM tension and how posterior/upper shoulder muscular tension and jaw pain can contribute to PFM tension. Pt has a hx of clenching jaw and has to wear a mouthguard at night.    Patient response to interventions: Pt said it takes a lot of concentration to think about her breathing and how it relates to PFM   Patient Education:  Patient provided with HEP including: bowel massage handout. Patient educated throughout session on appropriate technique and form using multi-modal cueing, HEP, and activity modification. Patient will continue to benefit from further  education in order to maximize compliance and understanding for long-term therapeutic gains.    ASSESSMENT:  Clinical Impression: Patient with excellent motivation to participate in today's session. Pt continues to demonstrate deficits in IAP management, PFM coordination, PFM strength, posture, pain, and LE strength/ROM. Upon PFM external assessment, Pt demonstrates deficits in PFM strength, PFM coordination, and PFM extensibility as  evidenced by inconsistent breath coordination, 2/5 MMT with gluteal activation and spasm of PFM, delayed PFM relaxation, and minimal descent with sustained IAP increase ("bear down") with Valsalva maneuver. Discussion with Pt on how PFM tension can lead to incontinence problems. Pt verbalized understanding. Pt continues to require significant VCs and TCs for diaphragmatic breathing as Pt voluntarily contracts obliques/rectus during inhalation. Pt responded positively to manual, active, and educational interventions. Patient will continue to benefit from skilled therapeutic intervention to address deficits in IAP management, PFM coordination, PFM strength, PFM extensibility, posture, pain, and LE strength/ROM in order to increase PLOF and improve overall QOL.    Objective Impairments: decreased activity tolerance, decreased coordination, decreased endurance, decreased strength, improper body mechanics, postural dysfunction, and pain.   Activity Limitations: lifting, bending, squatting, continence, toileting, and locomotion level  Personal Factors: Age, Past/current experiences, Time since onset of injury/illness/exacerbation, and 3+ comorbidities: mild cognitive impairment, HTN, osteopenia  are also affecting patient's functional outcome.   Rehab Potential: Good  Clinical Decision Making: Evolving/moderate complexity  Evaluation Complexity: Moderate   GOALS: Goals reviewed with patient? Yes  SHORT TERM GOALS: Target date: 11/25/2021  Patient will demonstrate  independent and coordinated diaphragmatic breathing in supine with a 1:2 breathing pattern for improved down-regulation of the nervous system and improved management of intra-abdominal pressures in order to increase function at home and in the community. Baseline: (8/3): Valsalva maneuver throughout session and contracted rectus/obliques with inhalation Goal status: INITIAL    LONG TERM GOALS: Target date: 01/06/2022 (10/16)  Patient will decrease worst lumbar spine pain as reported on NPRS by at least 2 points to demonstrate clinically significant reduction in pain in order to restore/improve function and overall QOL. Baseline: 10/10  Goal status: INITIAL  2.  Patient will report being able to return to activities including, but not limited to: walking, reading, household chores, and prolonged physical activity without pain or limitation to indicate improved core strengthening and return to prior level of participation at home and in the community. Baseline: moderate-quite a bit of difficulty performing household activities, sitting for long periods of time Goal status: INITIAL  3.  Patient will report decreased reliance on protective undergarments as indicated by a 24 hour period to demonstrate improved bladder control and allow for increased participation in activities outside of the home. Baseline: pantyliners/3x per day  Goal status: INITIAL  4.  Patient will report less than 5 incidents of stress urinary incontinence over the course of 3 weeks while coughing/sneezing/laughing/prolonged activity in order to demonstrate improved PFM coordination, strength, and function for improved overall QOL. Baseline: leakage occurs every time with all the above Goal status: INITIAL  5.  Patient will report confidence in ability to control bladder > 7/10 in order to demonstrate improved function and ability to participate more fully in activities at home and in the community. Baseline: 5/10 Goal  status: INITIAL    PLAN: PT Frequency: 1x/week  PT Duration: 12 weeks  Planned Interventions: Therapeutic exercises, Therapeutic activity, Neuromuscular re-education, Balance training, Gait training, Patient/Family education, Self Care, Joint mobilization, Cryotherapy, Moist heat, scar mobilization, Taping, and Manual therapy  Plan For Next Session: PFM lengthen, breathing/rib flare technique, rotation?   Judea Riches, PT, DPT  10/14/2021, 8:43 AM

## 2021-10-21 ENCOUNTER — Ambulatory Visit: Payer: Medicare Other

## 2021-10-21 DIAGNOSIS — M6281 Muscle weakness (generalized): Secondary | ICD-10-CM | POA: Diagnosis not present

## 2021-10-21 DIAGNOSIS — M6289 Other specified disorders of muscle: Secondary | ICD-10-CM

## 2021-10-21 DIAGNOSIS — R278 Other lack of coordination: Secondary | ICD-10-CM | POA: Diagnosis not present

## 2021-10-21 NOTE — Therapy (Signed)
OUTPATIENT PHYSICAL THERAPY FEMALE PELVIC TREATMENT   Patient Name: Kristin Kramer MRN: [id number] DOB:05/27/1940, 81 y.o., female Today's Date: 10/21/2021   PT End of Session - 10/21/21 0847     Visit Number 4    Number of Visits 12    Date for PT Re-Evaluation 12/17/21    Authorization Type IE: 09/24/21    PT Start Time 0846    PT Stop Time 0926    PT Time Calculation (min) 40 min    Activity Tolerance Patient tolerated treatment well             Past Medical History:  Diagnosis Date   Abdominal pain 12/2012   [facility] ER - mesenteric adenitis 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 left axis deviation;  b. 12/2011 Echo: EF 55-60%, no rwma, pasp 50mHg.   Abnormal EKG    Chronic LAFB, incomplete RBBB Has been previously cleared by cardiology for surgery ([place] Situ2013, 2015)   Basal cell carcinoma 02/2021   R nose tip ([place])   Cervical neck pain with evidence of disc disease 08/05/2016   Last Assessment & Plan:  Ongoing cervical neck pain after fall. NSAIDs effective for relief. She has started seeing chiropractor. Reviewed ER workup including CT showing evidence of bulging disc and disc space loss.   Chronic nasal congestion 04/19/2017   Ongoing since sinus surgery 05/2016  S/p ENT and allergist eval.    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 Recovering well from this.   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   S/p bilateral knee replacement  Formatting of this note might be different from the original. 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 Whitworth/[place]), R tibia x4+, L tibia x2, L dorsal hand   TMJ tenderness, right 02/08/2018   Tubular adenoma of colon 08/2014   Varicose veins of both lower extremities with pain 05/11/2020   Vitamin D deficiency    Past Surgical History:  Procedure Laterality Date   AUGMENTATION MAMMAPLASTY     BREAST ENHANCEMENT SURGERY     BUNIONECTOMY  2013   R and L foot   CATARACT EXTRACTION Bilateral 2013   R, pending L   COLONOSCOPY  2007   no records received   COLONOSCOPY  08/2014    tubular adenoma, rpt 5 yrs Fuller Plan)   COLONOSCOPY  06/2020   4 polyps removed, TA, no rpt recommended [facility] Plan)   CONVERSION TO TOTAL HIP Right 12/07/2017   Procedure: CONVERSION TO TOTAL HIP ( REMOVING SMITH AND NEPHEW NAIL);  Surgeon: Hessie Knows, MD;  Location: [facility];  Service: Orthopedics;  Laterality: Right;   COSMETIC SURGERY  2002   face lift   dexa  06/2008   T -1.4 spine and hip   ETHMOIDECTOMY Bilateral 05/19/2016   Procedure: ETHMOIDECTOMY;  Surgeon: Carloyn Manner, MD;  Location: [facility];  Service: ENT;  Laterality: Bilateral;   EYE SURGERY     bilateral cataract surgery   FRONTAL SINUS EXPLORATION Bilateral 05/19/2016   Procedure: FRONTAL SINUS EXPLORATION;  Surgeon: Carloyn Manner, MD;  Location: [facility];  Service: ENT;  Laterality: Bilateral;   INTRAMEDULLARY (IM) NAIL INTERTROCHANTERIC Right 07/08/2017   Procedure: RIGHT INTERTROCHANTRIC IM NAIL;  Surgeon: Leandrew Koyanagi, MD;  Location: [place];  Service: Orthopedics;  Laterality: Right;   MAXILLARY ANTROSTOMY Bilateral 05/19/2016   Procedure: MAXILLARY ANTROSTOMY;  Surgeon: Carloyn Manner, MD;  Location: [facility];  Service: ENT;  Laterality: Bilateral;   MOHS SURGERY  2011   R leg   PARTIAL HYSTERECTOMY  1980   fibroids, ovaries remained   SINUS ENDO W/FUSION N/A 05/19/2016   Procedure: ENDOSCOPIC SINUS SURGERY WITH NAVIGATION;  Surgeon: Carloyn Manner, MD;  Location: [facility];  Service: ENT;  Laterality: N/A;   SPHENOIDECTOMY Bilateral 05/19/2016   Procedure: Coralee Pesa;  Surgeon: Carloyn Manner, MD;  Location: [facility];  Service: ENT;  Laterality: Bilateral;   SQUAMOUS CELL CARCINOMA EXCISION Right 07/13/2016   Dr. Rolm Bookbinder, [facility]   TONSILLECTOMY AND [place] Right 08/07/2019   Procedure: Right hip acetabular versus total hip arthroplasty revision-posterior;  Surgeon: Gaynelle Arabian, MD;  Location: [facility];  Service: Orthopedics;  Laterality: Right;   119mn   TOTAL KNEE ARTHROPLASTY  01/02/2012   Right - Surgeon: RLorn Junes MD   TOTAL KNEE ARTHROPLASTY Left 03/18/2013   Surgeon: RLorn Junes MD   Patient Active Problem List   Diagnosis Date Noted   Urinary, incontinence, stress female 09/10/2021   Osteoarthritis of carpometacarpal ([facility] joint of right thumb 09/10/2021   Left rib fracture 02/08/2021   Pre-diabetes 12/23/2020   Mild cognitive impairment 12/23/2020   Generalized anxiety disorder    Varicose veins of both lower extremities with pain 05/11/2020   Chronic rhinitis 03/10/2020   Elevated blood pressure reading without diagnosis of hypertension 03/10/2020   Health maintenance examination 09/05/2019   GERD (gastroesophageal reflux disease) 09/05/2019   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   Situational stress 07/10/2019   Osteopenia 09/03/2018   HLD (hyperlipidemia) 09/03/2018   Vitamin D deficiency 09/03/2018   Leg cramping 08/15/2018  Hypothyroidism 08/15/2018   TMJ tenderness, right 02/08/2018   Lactose intolerance 02/08/2018   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   Chronic nasal congestion 04/19/2017   Exertional dyspnea 04/19/2017   Cervical neck pain with evidence of disc disease 08/05/2016   Advanced care planning/counseling discussion 07/31/2014   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   Osteoarthritis of knee 03/18/2013   Postoperative nausea and vomiting    Transfusion of blood product refused for religious reason    Medicare annual wellness visit, subsequent 01/14/2013   Elevated alkaline phosphatase level 01/03/2013   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 No  OCCUPATION/SOCIAL ACTIVITIES: swimming, walking, dinner, playing cards  PLOF: Independent  PERTINENT HISTORY/CHART REVIEW: From 02/08/21 note with [name]: Diagnosed with mild cognitive impairment of unclear etiology, related to anxiety > depression. Not consistent of alzheimers or other neurodegenerative illness at this time.   12/30/20 note with [name] - Neurology - Pt presented for mild cognitive impairment. Discussed results.   CHIEF CONCERN: Pt has constant urinary leakage with coughing/laughing/sneezing/jumping. Pt has not noticed urinary leakage with walking, but her pantyliner is soiled enough where she needs to change it. Pt has had leakage more than 3 years ago but it is now more constant. Pt has had instances of bowel leakage, but not regularly. Pt has had to change undergarments because she feels it is just gas but it is not. Pt does notice when she goes to urinate and then wipes there is a bowel stain although she did not have a BM.   Pain type: aching Pain description: constant   Aggravating factors: prolonged sitting, household chores  Relieving factors: dry needling. Chiropractor, heat  LIVING ENVIRONMENT: Lives with: lives alone, has a son in [place]  Lives in: House/apartment   PATIENT GOALS: Increase strength in the deep core, I would like to not leak and use of pantyliners or smelling like urine     UROLOGICAL HISTORY Fluid intake: flavored water (1-2/day), 2 cups of coffee   Pain with urination: No Fully empty bladder: No Stream: stop and go  Urgency: No Frequency: 5-6x Nocturia: 0x Leakage: Coughing, Sneezing, Laughing, Exercise, and Lifting Pads: Yes Type:  pantyliners Amount: 3x/day, soiled, wearing through the night  Bladder control (0-10): 5/10   GASTROINTESTINAL HISTORY Leakage: Yes but very rarely  Pads: No    SEXUAL HISTORY/FUNCTION Pt has no concerns   OBSTETRICAL HISTORY Vaginal deliveries: G3P3 Tearing: Yes: stiches with first child    GYNECOLOGICAL HISTORY Hysterectomy: yes, partial hysterectomy  Pelvic Organ Prolapse: None Heaviness/pressure: no   SUBJECTIVE:  Pt talked to Dr about thyroid medication and constipation but he said it should not effect it. Pt reports feeling she is not wetting her pad as much.              PAIN:  Are you having pain? No  OBJECTIVE:   COGNITION: Overall cognitive status: History of cognitive impairments - at baseline -see above in pertinent history/chart review    POSTURE:  Iliac crest height: L iliac crest higher   Pelvic obliquity: L posteriorly rotated   SENSATION:  Light touch: intact, L2-S2 dermatomes     RANGE OF MOTION:    (Norm range in degrees)  LEFT 10/07/21 RIGHT 10/07/21  Lumbar forward flexion (65):  WNL    Lumbar extension (30): WNL    Lumbar lateral flexion (25):  WNL WNL  Thoracic and Lumbar rotation (30 degrees):    WNL WNL  Hip Flexion (0-125):   Restricted* WNL*  Hip IR (0-45):  WNL WNL  Hip ER (0-45):  WNL WNL  Hip Adduction:      Hip Abduction (0-40):  Restricted* WNL  Hip extension (0-15):     (*= pain, Blank rows = not tested)   STRENGTH: MMT    RLE 10/07/21 LLE 10/07/21  Hip Flexion 4 4  Hip Extension 4 4  Hip Abduction     Hip Adduction     Hip ER  5 4  Hip IR  5 4*  Knee Extension 5 5  Knee Flexion 4 4  Dorsiflexion     Plantarflexion (seated) 5 5  (*= pain, Blank rows = not tested)   SPECIAL TESTS:   FABER (SN 81): negative B FADIR (SN 94): negative  B  PALPATION:  Abdominal:  Diastasis:  2 fingers below umbilicus, Valsalva maneuver noted Rib flare: present B   TODAY'S TREATMENT   Neuromuscular Re-education: Discussion about bowel movements and how a well-balanced meal 2-3x/day can aid in regular BMs. Pt reports eating a lot of dairy and "junk" food which may be a reason for her constipation. Discussion also on physical activity aiding in regular bowel movements.   Supine hooklying diaphragmatic breathing with VCs and TCs for downregulation of the nervous system and improved management of IAP Significant cueing to decrease activation on superficial abdominal musculature on inhalation   Supine hooklying diaphragmatic breathing with VCs and TCs for downregulation of the nervous system and improved management of IAP   Supine hooklying PFM lengthening techniques with diaphragmatic breathing, VCs and TCs as needed              B Single knee to chest   Double knee to chest              "Happy baby" pose   Butterfly pose "adductor stretch"   Piriformis stretch    Patient response to interventions: Pt feels increased tightness in the L hip during PFM lengthening but eased with breath   Patient Education:  Patient provided with HEP including: PFM lengthening techniques above. Patient educated throughout session on appropriate technique and form using multi-modal cueing, HEP, and activity modification. Patient will continue to benefit from further education in order to maximize compliance and understanding for long-term therapeutic gains.    ASSESSMENT:  Clinical Impression: Patient with excellent motivation to participate in today's session. Pt continues to demonstrate deficits in IAP management, PFM coordination, PFM strength, posture, pain, and LE strength/ROM. Pt continues to have some problems with bowels which is unusual but Dr prescribing thyroid medication said it should not be causing constipation. Pt will continue to  monitor. Pt required moderate VCs and TCs during active interventions as Pt intermittently demonstrates activation of superficial abdominal musculature with inhalation. With increased time and cueing, Pt able to perform active interventions with proper diaphragmatic breathing. Pt  with some R sided discomfort with PFM lengthening techniques but eased with continued effort. Pt responded positively to active and educational interventions. Patient will continue to benefit from skilled therapeutic intervention to address deficits in IAP management, PFM coordination, PFM strength, PFM extensibility, posture, pain, and LE strength/ROM in order to increase PLOF and improve overall QOL.    Objective Impairments: decreased activity tolerance, decreased coordination, decreased endurance, decreased strength, improper body mechanics, postural dysfunction, and pain.   Activity Limitations: lifting, bending, squatting, continence, toileting, and locomotion level  Personal Factors: Age, Past/current experiences, Time since onset of injury/illness/exacerbation, and 3+ comorbidities: mild cognitive impairment, HTN, osteopenia  are also affecting patient's functional outcome.   Rehab Potential: Good  Clinical Decision Making: Evolving/moderate complexity  Evaluation Complexity: Moderate   GOALS: Goals reviewed with patient? Yes  SHORT TERM GOALS: Target date: 12/02/2021  Patient will demonstrate independent and coordinated diaphragmatic breathing in supine with a 1:2 breathing pattern for improved down-regulation of the nervous system and improved management of intra-abdominal pressures in order to increase function at home and in the community. Baseline: (8/3): Valsalva maneuver throughout session and contracted rectus/obliques with inhalation Goal status: INITIAL    LONG TERM GOALS: Target date: 01/13/2022 (10/16)  Patient will decrease worst lumbar spine pain as reported on NPRS by at least 2 points to  demonstrate clinically significant reduction in pain in order to restore/improve function and overall QOL. Baseline: 10/10  Goal status: INITIAL  2.  Patient will report being able to return to activities including, but not limited to: walking, reading, household chores, and prolonged physical activity without pain or limitation to indicate improved core strengthening and return to prior level of participation at home and in the community. Baseline: moderate-quite a bit of difficulty performing household activities, sitting for long periods of time Goal status: INITIAL  3.  Patient will report decreased reliance on protective undergarments as indicated by a 24 hour period to demonstrate improved bladder control and allow for increased participation in activities outside of the home. Baseline: pantyliners/3x per day  Goal status: INITIAL  4.  Patient will report less than 5 incidents of stress urinary incontinence over the course of 3 weeks while coughing/sneezing/laughing/prolonged activity in order to demonstrate improved PFM coordination, strength, and function for improved overall QOL. Baseline: leakage occurs every time with all the above Goal status: INITIAL  5.  Patient will report confidence in ability to control bladder > 7/10 in order to demonstrate improved function and ability to participate more fully in activities at home and in the community. Baseline: 5/10 Goal status: INITIAL    PLAN: PT Frequency: 1x/week  PT Duration: 12 weeks  Planned Interventions: Therapeutic exercises, Therapeutic activity, Neuromuscular re-education, Balance training, Gait training, Patient/Family education, Self Care, Joint mobilization, Cryotherapy, Moist heat, scar mobilization, Taping, and Manual therapy  Plan For Next Session: thoracic rotation, manual?, more relax techniques    Chico Cawood, PT, DPT  10/21/2021, 10:10 AM

## 2021-10-28 ENCOUNTER — Other Ambulatory Visit: Payer: Self-pay | Admitting: Family Medicine

## 2021-10-28 ENCOUNTER — Ambulatory Visit: Payer: Medicare Other

## 2021-10-28 DIAGNOSIS — M6281 Muscle weakness (generalized): Secondary | ICD-10-CM

## 2021-10-28 DIAGNOSIS — M6289 Other specified disorders of muscle: Secondary | ICD-10-CM

## 2021-10-28 DIAGNOSIS — R278 Other lack of coordination: Secondary | ICD-10-CM | POA: Diagnosis not present

## 2021-10-28 NOTE — Therapy (Signed)
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 left axis deviation;  b. 12/2011 Echo: EF 55-60%, no rwma, pasp 64mHg.   Abnormal EKG    Chronic LAFB, incomplete RBBB Has been previously cleared by cardiology for surgery ([place]ockey Situ2013, 2015)   Basal cell carcinoma 02/2021   R nose tip ([place])   Cervical neck pain with evidence of disc disease 08/05/2016   Last Assessment & Plan:  Ongoing cervical neck pain after fall. NSAIDs effective for relief. She has started seeing chiropractor. Reviewed ER workup including CT showing evidence of bulging disc and disc space loss.   Chronic nasal congestion 04/19/2017   Ongoing since sinus surgery 05/2016  S/p ENT and allergist eval.    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 Recovering well from this.   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   S/p bilateral knee replacement  Formatting of this note might be different from the original. 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   Migraines    Mild cognitive impairment 12/23/2020   Osteoarthritis of knee 03/18/2013   Osteopenia 08/2016   hip -2.4, spine -1.4   Postoperative nausea and vomiting    Pre-diabetes    Refusal of blood transfusions as patient is Jehovah's Witness    Right rib fracture 05/21/2014   [facility]clerosing [facility]enteritis [facility]/03/2013   [facility] vs sclerosing mesenteritis vs nonspecific lymphadenitis by CT (12/2012) Recurrent symptoms 06/2017 - CT consistent with sclerosing mesenteritis - treated with short prednisone taper Saw GI Dr SFuller Planthis week.    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 Whitworth/[place]ine[place]fer), R tibia x4+, L tibia x2, L dorsal hand   TMJ tenderness, right 02/08/2018   Tubular adenoma of colon 08/2014   Varicose veins of both lower extremities with pain 05/11/2020   Vitamin D deficiency    Past Surgical History:  Procedure Laterality Date   AUGMENTATION MAMMAPLASTY     BREAST ENHANCEMENT SURGERY     BUNIONECTOMY  2013   R and L foot   CATARACT EXTRACTION Bilateral 2013   R, pending L   COLONOSCOPY  2007   no records received   COLONOSCOPY  08/2014    tubular adenoma, rpt 5 yrs Fuller Plan)   COLONOSCOPY  06/2020   4 polyps removed, TA, no rpt recommended [facility] Plan)   CONVERSION TO TOTAL HIP Right 12/07/2017   Procedure: CONVERSION TO TOTAL HIP ( REMOVING SMITH AND NEPHEW NAIL);  Surgeon: Hessie Knows, MD;  Location: [facility];  Service: Orthopedics;  Laterality: Right;   COSMETIC SURGERY  2002   face lift   dexa  06/2008   T -1.4 spine and hip   ETHMOIDECTOMY Bilateral 05/19/2016   Procedure: ETHMOIDECTOMY;  Surgeon: Carloyn Manner, MD;  Location: [facility];  Service: ENT;  Laterality: Bilateral;   EYE SURGERY     bilateral cataract surgery   FRONTAL SINUS EXPLORATION Bilateral 05/19/2016   Procedure: FRONTAL SINUS EXPLORATION;  Surgeon: Carloyn Manner, MD;  Location: [facility];  Service: ENT;  Laterality: Bilateral;   INTRAMEDULLARY (IM) NAIL INTERTROCHANTERIC Right 07/08/2017   Procedure: RIGHT INTERTROCHANTRIC IM NAIL;  Surgeon: Leandrew Koyanagi, MD;  Location: [place];  Service: Orthopedics;  Laterality: Right;   MAXILLARY ANTROSTOMY Bilateral 05/19/2016   Procedure: MAXILLARY ANTROSTOMY;  Surgeon: Carloyn Manner, MD;  Location: [facility];  Service: ENT;  Laterality: Bilateral;   MOHS SURGERY  2011   R leg   PARTIAL HYSTERECTOMY  1980   fibroids, ovaries remained   SINUS ENDO W/FUSION N/A 05/19/2016   Procedure: ENDOSCOPIC SINUS SURGERY WITH NAVIGATION;  Surgeon: Carloyn Manner, MD;  Location: [facility];  Service: ENT;  Laterality: N/A;   SPHENOIDECTOMY Bilateral 05/19/2016   Procedure: Coralee Pesa;  Surgeon: Carloyn Manner, MD;  Location: [facility];  Service: ENT;  Laterality: Bilateral;   SQUAMOUS CELL CARCINOMA EXCISION Right 07/13/2016   Dr. Rolm Bookbinder, [facility]   TONSILLECTOMY AND [place] Right 08/07/2019   Procedure: Right hip acetabular versus total hip arthroplasty revision-posterior;  Surgeon: Gaynelle Arabian, MD;  Location: [facility];  Service: Orthopedics;  Laterality: Right;   119mn   TOTAL KNEE ARTHROPLASTY  01/02/2012   Right - Surgeon: RLorn Junes MD   TOTAL KNEE ARTHROPLASTY Left 03/18/2013   Surgeon: RLorn Junes MD   Patient Active Problem List   Diagnosis Date Noted   Urinary, incontinence, stress female 09/10/2021   Osteoarthritis of carpometacarpal ([facility] joint of right thumb 09/10/2021   Left rib fracture 02/08/2021   Pre-diabetes 12/23/2020   Mild cognitive impairment 12/23/2020   Generalized anxiety disorder    Varicose veins of both lower extremities with pain 05/11/2020   Chronic rhinitis 03/10/2020   Elevated blood pressure reading without diagnosis of hypertension 03/10/2020   Health maintenance examination 09/05/2019   GERD (gastroesophageal reflux disease) 09/05/2019   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   Situational stress 07/10/2019   Osteopenia 09/03/2018   HLD (hyperlipidemia) 09/03/2018   Vitamin D deficiency 09/03/2018   Leg cramping 08/15/2018  Hypothyroidism 08/15/2018   TMJ tenderness, right 02/08/2018   Lactose intolerance 02/08/2018   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   Chronic nasal congestion 04/19/2017   Exertional dyspnea 04/19/2017   Cervical neck pain with evidence of disc disease 08/05/2016   Advanced care planning/counseling discussion 07/31/2014   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   Osteoarthritis of knee 03/18/2013   Postoperative nausea and vomiting    Transfusion of blood product refused for religious reason    Medicare annual wellness visit, subsequent 01/14/2013   Elevated alkaline phosphatase level 01/03/2013   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 No  OCCUPATION/SOCIAL ACTIVITIES: swimming, walking, dinner, playing cards  PLOF: Independent  PERTINENT HISTORY/CHART REVIEW: From 02/08/21 note with [name]: Diagnosed with mild cognitive impairment of unclear etiology, related to anxiety > depression. Not consistent of alzheimers or other neurodegenerative illness at this time.   12/30/20 note with [name] - Neurology - Pt presented for mild cognitive impairment. Discussed results.   CHIEF CONCERN: Pt has constant urinary leakage with coughing/laughing/sneezing/jumping. Pt has not noticed urinary leakage with walking, but her pantyliner is soiled enough where she needs to change it. Pt has had leakage more than 3 years ago but it is now more constant. Pt has had instances of bowel leakage, but not regularly. Pt has had to change undergarments because she feels it is just gas but it is not. Pt does notice when she goes to urinate and then wipes there is a bowel stain although she did not have a BM.   Pain type: aching Pain description: constant   Aggravating factors: prolonged sitting, household chores  Relieving factors: dry needling. Chiropractor, heat  LIVING ENVIRONMENT: Lives with: lives alone, has a son in [place]  Lives in: House/apartment   PATIENT GOALS: Increase strength in the deep core, I would like to not leak and use of pantyliners or smelling like urine     UROLOGICAL HISTORY Fluid intake: flavored water (1-2/day), 2 cups of coffee   Pain with urination: No Fully empty bladder: No Stream: stop and go  Urgency: No Frequency: 5-6x Nocturia: 0x Leakage: Coughing, Sneezing, Laughing, Exercise, and Lifting Pads: Yes Type:  pantyliners Amount: 3x/day, soiled, wearing through the night  Bladder control (0-10): 5/10   GASTROINTESTINAL HISTORY Leakage: Yes but very rarely  Pads: No    SEXUAL HISTORY/FUNCTION Pt has no concerns   OBSTETRICAL HISTORY Vaginal deliveries: G3P3 Tearing: Yes: stiches with first child    GYNECOLOGICAL HISTORY Hysterectomy: yes, partial hysterectomy  Pelvic Organ Prolapse: None Heaviness/pressure: no   SUBJECTIVE:  Pt reports her BMs have returned to normal. Pt does report some soreness in the hips after performing HEP. The soreness goes away with time.             PAIN:  Are you having pain? Yes NPRS: 3/10  OBJECTIVE:   COGNITION: Overall cognitive status: History of cognitive impairments - at baseline -see above in pertinent history/chart review    POSTURE:  Iliac crest height: L iliac crest higher   Pelvic obliquity: L posteriorly rotated   SENSATION:  Light touch: intact, L2-S2 dermatomes     RANGE OF MOTION:    (Norm range in degrees)  LEFT 10/07/21 RIGHT 10/07/21  Lumbar forward flexion (65):  WNL    Lumbar extension (30): WNL    Lumbar lateral flexion (25):  WNL WNL  Thoracic and Lumbar rotation (30 degrees):    WNL WNL  Hip Flexion (0-125):   Restricted* WNL*  Hip IR (0-45):  WNL WNL  Hip ER (0-45):  WNL WNL  Hip Adduction:      Hip Abduction (0-40):  Restricted* WNL  Hip extension (0-15):     (*= pain, Blank rows = not tested)   STRENGTH: MMT    RLE 10/07/21 LLE 10/07/21  Hip Flexion 4 4  Hip Extension 4 4  Hip Abduction     Hip Adduction     Hip ER  5 4  Hip IR  5 4*  Knee Extension 5 5  Knee Flexion 4 4  Dorsiflexion     Plantarflexion (seated) 5 5  (*= pain, Blank rows = not tested)   SPECIAL TESTS:   FABER (SN 81): negative B FADIR (SN 94): negative  B  PALPATION:  Abdominal:  Diastasis:  2 fingers below umbilicus, Valsalva maneuver noted Rib flare: present B   TODAY'S TREATMENT  Neuromuscular Re-education: Supine hooklying diaphragmatic breathing with VCs and TCs for downregulation of the nervous system and improved management of IAP  Supine hooklying PFM lengthening techniques with diaphragmatic breathing and pain modulation, VCs and TCs required and modifications for child's pose (increased pillow use)  Single knee to chest             Child's pose  Cat/cow, cueing to decrease bodily compensations   Right Sidelying thoracic rotations, B x10, for improved lengthening of the anterior fascial slings    Patient response to interventions: Pt ended session with improved lower back pain   Patient Education:  Patient provided with HEP including: PFM lengthening techniques above and sidelying thoracic rotation. Patient educated throughout session on appropriate technique and form using multi-modal cueing, HEP, and activity modification. Patient will continue to benefit from further education in order to maximize compliance and understanding for long-term therapeutic gains.    ASSESSMENT:  Clinical Impression: Patient with excellent motivation to participate in today's session. Pt continues to demonstrate deficits in IAP management, PFM coordination, PFM strength, posture, pain, and LE strength/ROM. Pt reports her bowels have returned to normal. Pt does report 3/10 lower back pain at beginning of session due to moving a dresser alone in her house on Tuesday. Pt feels like she had to strain to move it. Pt required moderate VCs and TCs during PFM lengthening/pain modulation techniques for low back and required modifications. Pt observed to have decrease ROM in the quad musculature and reported feeling a "big stretch" during child's pose. Pt reports improved lower back pain at the end of session. Pt responded positively to active and  educational interventions. Patient will continue to benefit from skilled therapeutic intervention to address deficits in IAP management, PFM coordination, PFM strength, PFM extensibility, posture, pain, and LE strength/ROM in order to increase PLOF and improve overall QOL.    Objective Impairments: decreased activity tolerance, decreased coordination, decreased endurance, decreased strength,  improper body mechanics, postural dysfunction, and pain.   Activity Limitations: lifting, bending, squatting, continence, toileting, and locomotion level  Personal Factors: Age, Past/current experiences, Time since onset of injury/illness/exacerbation, and 3+ comorbidities: mild cognitive impairment, HTN, osteopenia  are also affecting patient's functional outcome.   Rehab Potential: Good  Clinical Decision Making: Evolving/moderate complexity  Evaluation Complexity: Moderate   GOALS: Goals reviewed with patient? Yes  SHORT TERM GOALS: Target date: 12/09/2021  Patient will demonstrate independent and coordinated diaphragmatic breathing in supine with a 1:2 breathing pattern for improved down-regulation of the nervous system and improved management of intra-abdominal pressures in order to increase function at home and in the community. Baseline: (8/3): Valsalva maneuver throughout session and contracted rectus/obliques with inhalation Goal status: INITIAL    LONG TERM GOALS: Target date: 01/20/2022 (10/16)  Patient will decrease worst lumbar spine pain as reported on NPRS by at least 2 points to demonstrate clinically significant reduction in pain in order to restore/improve function and overall QOL. Baseline: 10/10  Goal status: INITIAL  2.  Patient will report being able to return to activities including, but not limited to: walking, reading, household chores, and prolonged physical activity without pain or limitation to indicate improved core strengthening and return to prior level of  participation at home and in the community. Baseline: moderate-quite a bit of difficulty performing household activities, sitting for long periods of time Goal status: INITIAL  3.  Patient will report decreased reliance on protective undergarments as indicated by a 24 hour period to demonstrate improved bladder control and allow for increased participation in activities outside of the home. Baseline: pantyliners/3x per day  Goal status: INITIAL  4.  Patient will report less than 5 incidents of stress urinary incontinence over the course of 3 weeks while coughing/sneezing/laughing/prolonged activity in order to demonstrate improved PFM coordination, strength, and function for improved overall QOL. Baseline: leakage occurs every time with all the above Goal status: INITIAL  5.  Patient will report confidence in ability to control bladder > 7/10 in order to demonstrate improved function and ability to participate more fully in activities at home and in the community. Baseline: 5/10 Goal status: INITIAL    PLAN: PT Frequency: 1x/week  PT Duration: 12 weeks  Planned Interventions: Therapeutic exercises, Therapeutic activity, Neuromuscular re-education, Balance training, Gait training, Patient/Family education, Self Care, Joint mobilization, Cryotherapy, Moist heat, scar mobilization, Taping, and Manual therapy  Plan For Next Session: manual? Start deep core   Thora Scherman, PT, DPT  10/28/2021, 8:49 AM

## 2021-11-05 ENCOUNTER — Ambulatory Visit: Payer: Medicare Other | Attending: Family Medicine

## 2021-11-05 DIAGNOSIS — M6281 Muscle weakness (generalized): Secondary | ICD-10-CM | POA: Diagnosis not present

## 2021-11-05 DIAGNOSIS — R278 Other lack of coordination: Secondary | ICD-10-CM | POA: Diagnosis not present

## 2021-11-05 DIAGNOSIS — M6289 Other specified disorders of muscle: Secondary | ICD-10-CM | POA: Insufficient documentation

## 2021-11-05 NOTE — Therapy (Signed)
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 left axis deviation;  b. 12/2011 Echo: EF 55-60%, no rwma, pasp 64mHg.   Abnormal EKG    Chronic LAFB, incomplete RBBB Has been previously cleared by cardiology for surgery ([facility] Situ2013, 2015)   Basal cell carcinoma 02/2021   R nose tip ([place])   Cervical neck pain with evidence of disc disease 08/05/2016   Last Assessment & Plan:  Ongoing cervical neck pain after fall. NSAIDs effective for relief. She has started seeing chiropractor. Reviewed ER workup including CT showing evidence of bulging disc and disc space loss.   Chronic nasal congestion 04/19/2017   Ongoing since sinus surgery 05/2016  S/p ENT and allergist eval.    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 Recovering well from this.   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   S/p bilateral knee replacement  Formatting of this note might be different from the original. 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 Whitworth/[place]/[facility]), R tibia x4+, L tibia x2, L dorsal hand   TMJ tenderness, right 02/08/2018   Tubular adenoma of colon 08/2014   Varicose veins of both lower extremities with pain 05/11/2020   Vitamin D deficiency    Past Surgical History:  Procedure Laterality Date   AUGMENTATION MAMMAPLASTY     BREAST ENHANCEMENT SURGERY     BUNIONECTOMY  2013   R and L foot   CATARACT EXTRACTION Bilateral 2013   R, pending L   COLONOSCOPY  2007   no records received   COLONOSCOPY  08/2014    tubular adenoma, rpt 5 yrs Fuller Plan)   COLONOSCOPY  06/2020   4 polyps removed, TA, no rpt recommended Fuller Plan)   CONVERSION TO TOTAL HIP Right 12/07/2017   Procedure: CONVERSION TO TOTAL HIP ( REMOVING SMITH AND NEPHEW NAIL);  Surgeon: Hessie Knows, MD;  Location: [facility];  Service: Orthopedics;  Laterality: Right;   COSMETIC SURGERY  2002   face lift   dexa  06/2008   T -1.4 spine and hip   ETHMOIDECTOMY Bilateral 05/19/2016   Procedure: ETHMOIDECTOMY;  Surgeon: Carloyn Manner, MD;  Location: [facility];  Service: ENT;  Laterality: Bilateral;   EYE SURGERY     bilateral cataract surgery   FRONTAL SINUS EXPLORATION Bilateral 05/19/2016   Procedure: FRONTAL SINUS EXPLORATION;  Surgeon: Carloyn Manner, MD;  Location: [facility];  Service: ENT;  Laterality: Bilateral;   INTRAMEDULLARY (IM) NAIL INTERTROCHANTERIC Right 07/08/2017   Procedure: RIGHT INTERTROCHANTRIC IM NAIL;  Surgeon: Leandrew Koyanagi, MD;  Location: [facility];  Service: Orthopedics;  Laterality: Right;   MAXILLARY ANTROSTOMY Bilateral 05/19/2016   Procedure: MAXILLARY ANTROSTOMY;  Surgeon: Carloyn Manner, MD;  Location: [facility];  Service: ENT;  Laterality: Bilateral;   MOHS SURGERY  2011   R leg   PARTIAL HYSTERECTOMY  1980   fibroids, ovaries remained   SINUS ENDO W/FUSION N/A 05/19/2016   Procedure: ENDOSCOPIC SINUS SURGERY WITH NAVIGATION;  Surgeon: Carloyn Manner, MD;  Location: [facility];  Service: ENT;  Laterality: N/A;   SPHENOIDECTOMY Bilateral 05/19/2016   Procedure: Coralee Pesa;  Surgeon: Carloyn Manner, MD;  Location: [facility];  Service: ENT;  Laterality: Bilateral;   SQUAMOUS CELL CARCINOMA EXCISION Right 07/13/2016   Dr. Rolm Bookbinder, [facility]   TONSILLECTOMY AND [place] Right 08/07/2019   Procedure: Right hip acetabular versus total hip arthroplasty revision-posterior;  Surgeon: Gaynelle Arabian, MD;  Location: [facility];  Service: Orthopedics;  Laterality: Right;   110mn   TOTAL KNEE ARTHROPLASTY  01/02/2012   Right - Surgeon: RLorn Junes MD   TOTAL KNEE ARTHROPLASTY Left 03/18/2013   Surgeon: RLorn Junes MD   Patient Active Problem List   Diagnosis Date Noted   Urinary, incontinence, stress female 09/10/2021   Osteoarthritis of carpometacarpal ([facility] joint of right thumb 09/10/2021   Left rib fracture 02/08/2021   Pre-diabetes 12/23/2020   Mild cognitive impairment 12/23/2020   Generalized anxiety disorder    Varicose veins of both lower extremities with pain 05/11/2020   Chronic rhinitis 03/10/2020   Elevated blood pressure reading without diagnosis of hypertension 03/10/2020   Health maintenance examination 09/05/2019   GERD (gastroesophageal reflux disease) 09/05/2019   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   Situational stress 07/10/2019   Osteopenia 09/03/2018   HLD (hyperlipidemia) 09/03/2018   Vitamin D deficiency 09/03/2018   Leg cramping 08/15/2018  Hypothyroidism 08/15/2018   TMJ tenderness, right 02/08/2018   Lactose intolerance 02/08/2018   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   Chronic nasal congestion 04/19/2017   Exertional dyspnea 04/19/2017   Cervical neck pain with evidence of disc disease 08/05/2016   Advanced care planning/counseling discussion 07/31/2014   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   Osteoarthritis of knee 03/18/2013   Postoperative nausea and vomiting    Transfusion of blood product refused for religious reason    Medicare annual wellness visit, subsequent 01/14/2013   Elevated alkaline phosphatase level 01/03/2013   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 No  OCCUPATION/SOCIAL ACTIVITIES: swimming, walking, dinner, playing cards  PLOF: Independent  PERTINENT HISTORY/CHART REVIEW: From 02/08/21 note with [name]: Diagnosed with mild cognitive impairment of unclear etiology, related to anxiety > depression. Not consistent of alzheimers or other neurodegenerative illness at this time.   12/30/20 note with [name] - Neurology - Pt presented for mild cognitive impairment. Discussed results.   CHIEF CONCERN: Pt has constant urinary leakage with coughing/laughing/sneezing/jumping. Pt has not noticed urinary leakage with walking, but her pantyliner is soiled enough where she needs to change it. Pt has had leakage more than 3 years ago but it is now more constant. Pt has had instances of bowel leakage, but not regularly. Pt has had to change undergarments because she feels it is just gas but it is not. Pt does notice when she goes to urinate and then wipes there is a bowel stain although she did not have a BM.   Pain type: aching Pain description: constant   Aggravating factors: prolonged sitting, household chores  Relieving factors: dry needling. Chiropractor, heat  LIVING ENVIRONMENT: Lives with: lives alone, has a son in [place]  Lives in: House/apartment   PATIENT GOALS: Increase strength in the deep core, I would like to not leak and use of pantyliners or smelling like urine     UROLOGICAL HISTORY Fluid intake: flavored water (1-2/day), 2 cups of coffee   Pain with urination: No Fully empty bladder: No Stream: stop and go  Urgency: No Frequency: 5-6x Nocturia: 0x Leakage: Coughing, Sneezing, Laughing, Exercise, and Lifting Pads: Yes Type:  pantyliners Amount: 3x/day, soiled, wearing through the night  Bladder control (0-10): 5/10   GASTROINTESTINAL HISTORY Leakage: Yes but very rarely  Pads: No    SEXUAL HISTORY/FUNCTION Pt has no concerns   OBSTETRICAL HISTORY Vaginal deliveries: G3P3 Tearing: Yes: stiches with first child    GYNECOLOGICAL HISTORY Hysterectomy: yes, partial hysterectomy  Pelvic Organ Prolapse: None Heaviness/pressure: no   SUBJECTIVE:  Pt reports some concerns with cat/cow and child's pose causing some back pain.             PAIN:  Are you having pain? No NPRS: 0/10  OBJECTIVE:   COGNITION: Overall cognitive status: History of cognitive impairments - at baseline -see above in pertinent history/chart review    POSTURE:  Iliac crest height: L iliac crest higher   Pelvic obliquity: L posteriorly rotated   SENSATION:  Light touch: intact, L2-S2 dermatomes     RANGE OF MOTION:    (Norm range in degrees)  LEFT 10/07/21 RIGHT 10/07/21  Lumbar forward flexion (65):  WNL    Lumbar extension (30): WNL    Lumbar lateral flexion (25):  WNL WNL  Thoracic and Lumbar rotation (30 degrees):    WNL WNL  Hip Flexion (0-125):   Restricted* WNL*  Hip IR (0-45):  WNL WNL  Hip ER (0-45):  WNL WNL  Hip Adduction:      Hip Abduction (0-40):  Restricted* WNL  Hip extension (0-15):     (*= pain, Blank rows = not tested)   STRENGTH: MMT    RLE 10/07/21 LLE 10/07/21  Hip Flexion 4 4  Hip Extension 4 4  Hip Abduction     Hip Adduction     Hip ER  5 4  Hip IR  5 4*  Knee Extension 5 5  Knee Flexion 4 4  Dorsiflexion     Plantarflexion (seated) 5 5  (*= pain, Blank rows = not tested)   SPECIAL TESTS:   FABER (SN 81): negative B FADIR (SN 94): negative B  PALPATION:  Abdominal:  Diastasis:  2 fingers below  umbilicus, Valsalva maneuver noted Rib flare: present B   TODAY'S TREATMENT  Neuromuscular Re-education: Re-assessment of posture: no change Iliac crest height: L iliac crest higher   Pelvic obliquity: L posteriorly rotated  Rea-assessment of FOTO: * = improvement FOTO Urinary Problem - IE: 45 Today: 46 FOTO Bowel Leakage - IE: 41 Today: 66* FOTO Lumbar Spine - IE: 53 Today: 44 -Discussion on results compared to IE and progression, 2x/day changing pantyliners   Supine hooklying diaphragmatic breathing with VCs and TCs for downregulation of the nervous system and improved management of IAP  Various positions to target tissue extensibility of the hip flexor/quads - standing, rocking child's pose, but supine worked the best  Discussion on stretching (pull) vs. pain (throbbing, stabbing, burning)  A stretch should not be painful but may cause some discomfort as Pt reports feeling stiff in the hips and lower back   Review of HEP: cat/cow and child's pose with VCs and TCs for proper technique   Brief discussion on body mechanics with household chores (vacuuming, cleaning) and demonstration of staggered stance. Pt verbalized understanding and will practice in future sessions.   Patient response to interventions: Pt felt more confident in cat/cow after review    Patient Education:  Patient provided with HEP including: supine hip flexor pose. Patient educated throughout session on appropriate technique and form using multi-modal cueing, HEP, and activity modification. Patient will continue to benefit from further education in order to maximize compliance and understanding for long-term therapeutic gains.    ASSESSMENT:  Clinical Impression: Patient with excellent motivation to participate in today's session. Pt continues to demonstrate deficits in IAP management, PFM coordination, PFM strength, posture, pain, and LE strength/ROM. Time taken to reassess FOTO Lumbar Spine, Bowel Leakage,  and Urinary Problem. Pt has significantly improved with no bowel leakage and reports a 66 on FOTO measure (IE: 41). Urinary Problem and Lumbar Spine FOTO measures do not show a significant improvement, but will begin deep core activation next session. Pt reports that she feels  less urinary leakage as she only requires change of pantyliner 2x/day and they are not "damp" every time. Pt also continues to demonstrate an increase in L iliac crest height and posteriorly rotated innominate. Pt required moderate VCs and TCs during review of HEP and various positions targeting hip flexor/knee ext tissue extensibility. Pt responded positively to active and educational interventions. Patient will continue to benefit from skilled therapeutic intervention to address deficits in IAP management, PFM coordination, PFM strength, PFM extensibility, posture, pain, and LE strength/ROM in order to increase PLOF and improve overall QOL.    Objective Impairments: decreased activity tolerance, decreased coordination, decreased endurance, decreased strength, improper body mechanics, postural dysfunction, and pain.   Activity Limitations: lifting, bending, squatting, continence, toileting, and locomotion level  Personal Factors: Age, Past/current experiences, Time since onset of injury/illness/exacerbation, and 3+ comorbidities: mild cognitive impairment, HTN, osteopenia  are also affecting patient's functional outcome.   Rehab Potential: Good  Clinical Decision Making: Evolving/moderate complexity  Evaluation Complexity: Moderate   GOALS: Goals reviewed with patient? Yes  SHORT TERM GOALS: Target date: 11/05/21  Patient will demonstrate independent and coordinated diaphragmatic breathing in supine with a 1:2 breathing pattern for improved down-regulation of the nervous system and improved management of intra-abdominal pressures in order to increase function at home and in the community. Baseline: (8/3): Valsalva maneuver  throughout session and contracted rectus/obliques with inhalation; (9/1): able to demonstrate diaphragmatic breathing with no cueing  Goal status: MET    LONG TERM GOALS: Target date: 12/17/21  Patient will decrease worst lumbar spine pain as reported on NPRS by at least 2 points to demonstrate clinically significant reduction in pain in order to restore/improve function and overall QOL. Baseline: 10/10  Goal status: INITIAL  2.  Patient will report being able to return to activities including, but not limited to: walking, reading, household chores, and prolonged physical activity without pain or limitation to indicate improved core strengthening and return to prior level of participation at home and in the community. Baseline: moderate-quite a bit of difficulty performing household activities, sitting for long periods of time Goal status: INITIAL  3.  Patient will report decreased reliance on protective undergarments as indicated by a 24 hour period to demonstrate improved bladder control and allow for increased participation in activities outside of the home. Baseline: pantyliners/3x per day  Goal status: INITIAL  4.  Patient will report less than 5 incidents of stress urinary incontinence over the course of 3 weeks while coughing/sneezing/laughing/prolonged activity in order to demonstrate improved PFM coordination, strength, and function for improved overall QOL. Baseline: leakage occurs every time with all the above Goal status: INITIAL  5.  Patient will report confidence in ability to control bladder > 7/10 in order to demonstrate improved function and ability to participate more fully in activities at home and in the community. Baseline: 5/10 Goal status: INITIAL    PLAN: PT Frequency: 1x/week  PT Duration: 12 weeks  Planned Interventions: Therapeutic exercises, Therapeutic activity, Neuromuscular re-education, Balance training, Gait training, Patient/Family education, Self  Care, Joint mobilization, Cryotherapy, Moist heat, scar mobilization, Taping, and Manual therapy  Plan For Next Session: start deep core, how was the hip flexor/childs pose this week?   Kristin Kramer, PT, DPT  11/05/2021, 8:43 AM

## 2021-11-09 ENCOUNTER — Ambulatory Visit: Payer: Medicare Other

## 2021-11-09 ENCOUNTER — Telehealth: Payer: Self-pay

## 2021-11-09 DIAGNOSIS — R278 Other lack of coordination: Secondary | ICD-10-CM | POA: Diagnosis not present

## 2021-11-09 DIAGNOSIS — M6289 Other specified disorders of muscle: Secondary | ICD-10-CM | POA: Diagnosis not present

## 2021-11-09 DIAGNOSIS — M6281 Muscle weakness (generalized): Secondary | ICD-10-CM

## 2021-11-09 NOTE — Chronic Care Management (AMB) (Signed)
    Chronic Care Management Pharmacy Assistant   Name: Kristin Kramer  MRN: [id number] DOB: 10-Jan-1941  Reason for Encounter: Reminder Call   Medications: Outpatient Encounter Medications as of 11/09/2021  Medication Sig   aspirin EC 81 MG tablet Take 81 mg by mouth daily. Swallow whole.   busPIRone (BUSPAR) 5 MG tablet Take 1 tablet (5 mg total) by mouth 2 (two) times daily as needed (anxiety).   calcium carbonate (OSCAL) 1500 (600 Ca) MG TABS tablet Take 600 mg of elemental calcium by mouth daily with breakfast.   Cholecalciferol (VITAMIN D-3) 5000 units TABS Take 5,000 Units by mouth daily.    Coenzyme Q10 (COQ10 PO) Take 1 capsule by mouth daily.    ezetimibe (ZETIA) 10 MG tablet Take 1 tablet (10 mg total) by mouth daily.   famotidine (PEPCID) 20 MG tablet Take 20 mg by mouth daily as needed for heartburn or indigestion.   fexofenadine (ALLEGRA ALLERGY) 180 MG tablet Take 1 tablet (180 mg total) by mouth daily.   hydrochlorothiazide (MICROZIDE) 12.5 MG capsule Take 1 capsule (12.5 mg total) by mouth daily.   levothyroxine (SYNTHROID) 50 MCG tablet Take 1 tablet (50 mcg total) by mouth daily before breakfast.   losartan (COZAAR) 25 MG tablet Take 1 tablet (25 mg total) by mouth daily.   Magnesium 250 MG TABS Take 1 tablet (250 mg total) by mouth daily.   METAMUCIL FIBER PO Take by mouth daily.   niacinamide 500 MG tablet Take 1 tablet (500 mg total) by mouth daily.   Potassium 99 MG TABS Take 1 tablet (99 mg total) by mouth daily.   prednisoLONE acetate (PRED FORTE) 1 % ophthalmic suspension    Probiotic Product (PROBIOTIC PO) Take 1 capsule by mouth daily.    sertraline (ZOLOFT) 25 MG tablet Take 1.5 tablets (37.5 mg total) by mouth at bedtime.   TURMERIC PO Take 900 mg by mouth daily.    No facility-administered encounter medications on file as of 11/09/2021.   Kristin Kramer was contacted to remind of upcoming telephone visit with [name] on 11/12/21 at 1:00.  Patient was reminded to have any blood glucose and blood pressure readings available for review at appointment.   Patient confirmed appointment.  Are you having any problems with your medications? No   Do you have any concerns you like to discuss with the pharmacist? No  CCM referral has been placed prior to visit?  Yes   Star Rating Drugs: Medication:  Last Fill: Day Supply Losartan '25mg'$  08/23/21 90  Recommendations/Changes made from today's visit: -Advised trial off omeprazole due to bone density risks, use famotidine 20 mg daily instead   Plan: -[name] will call patient 3 months for BP log -Pharmacist follow up televisit scheduled for 6 months -PCP visit 09/10/21 ([facility])  [name], CPP notified  Avel Sensor, [place]  [phone]

## 2021-11-09 NOTE — Therapy (Signed)
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 left axis deviation;  b. 12/2011 Echo: EF 55-60%, no rwma, pasp 55mHg.   Abnormal EKG    Chronic LAFB, incomplete RBBB Has been previously cleared by cardiology for surgery (Rockey Situ2013, 2015)   Basal cell carcinoma 02/2021   R nose tip ([place])   Cervical neck pain with evidence of disc disease 08/05/2016   Last Assessment & Plan:  Ongoing cervical neck pain after fall. NSAIDs effective for relief. She has started seeing chiropractor. Reviewed ER workup including CT showing evidence of bulging disc and disc space loss.   Chronic nasal congestion 04/19/2017   Ongoing since sinus surgery 05/2016  S/p ENT and allergist eval.    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 Recovering well from this.   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   S/p bilateral knee replacement  Formatting of this note might be different from the original. 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 Whitworth/[place]/Stinehelfer), R tibia x4+, L tibia x2, L dorsal hand   TMJ tenderness, right 02/08/2018   Tubular adenoma of colon 08/2014   Varicose veins of both lower extremities with pain 05/11/2020   Vitamin D deficiency    Past Surgical History:  Procedure Laterality Date   AUGMENTATION MAMMAPLASTY     BREAST ENHANCEMENT SURGERY     BUNIONECTOMY  2013   R and L foot   CATARACT EXTRACTION Bilateral 2013   R, pending L   COLONOSCOPY  2007   no records received   COLONOSCOPY  08/2014    tubular adenoma, rpt 5 yrs Fuller Plan)   COLONOSCOPY  06/2020   4 polyps removed, TA, no rpt recommended Fuller Plan)   CONVERSION TO TOTAL HIP Right 12/07/2017   Procedure: CONVERSION TO TOTAL HIP ( REMOVING SMITH AND NEPHEW NAIL);  Surgeon: Hessie Knows, MD;  Location: [facility];  Service: Orthopedics;  Laterality: Right;   COSMETIC SURGERY  2002   face lift   dexa  06/2008   T -1.4 spine and hip   ETHMOIDECTOMY Bilateral 05/19/2016   Procedure: ETHMOIDECTOMY;  Surgeon: Carloyn Manner, MD;  Location: [facility];  Service: ENT;  Laterality: Bilateral;   EYE SURGERY     bilateral cataract surgery   FRONTAL SINUS EXPLORATION Bilateral 05/19/2016   Procedure: FRONTAL SINUS EXPLORATION;  Surgeon: Carloyn Manner, MD;  Location: [facility];  Service: ENT;  Laterality: Bilateral;   INTRAMEDULLARY (IM) NAIL INTERTROCHANTERIC Right 07/08/2017   Procedure: RIGHT INTERTROCHANTRIC IM NAIL;  Surgeon: Leandrew Koyanagi, MD;  Location: [facility];  Service: Orthopedics;  Laterality: Right;   MAXILLARY ANTROSTOMY Bilateral 05/19/2016   Procedure: MAXILLARY ANTROSTOMY;  Surgeon: Carloyn Manner, MD;  Location: [facility];  Service: ENT;  Laterality: Bilateral;   MOHS SURGERY  2011   R leg   PARTIAL HYSTERECTOMY  1980   fibroids, ovaries remained   SINUS ENDO W/FUSION N/A 05/19/2016   Procedure: ENDOSCOPIC SINUS SURGERY WITH NAVIGATION;  Surgeon: Carloyn Manner, MD;  Location: [facility];  Service: ENT;  Laterality: N/A;   SPHENOIDECTOMY Bilateral 05/19/2016   Procedure: Coralee Pesa;  Surgeon: Carloyn Manner, MD;  Location: [facility];  Service: ENT;  Laterality: Bilateral;   SQUAMOUS CELL CARCINOMA EXCISION Right 07/13/2016   Dr. Rolm Bookbinder, [facility]   TONSILLECTOMY AND [place] Right 08/07/2019   Procedure: Right hip acetabular versus total hip arthroplasty revision-posterior;  Surgeon: Gaynelle Arabian, MD;  Location: [facility];  Service: Orthopedics;  Laterality: Right;   156mn   TOTAL KNEE ARTHROPLASTY  01/02/2012   Right - Surgeon: RLorn Junes MD   TOTAL KNEE ARTHROPLASTY Left 03/18/2013   Surgeon: RLorn Junes MD   Patient Active Problem List   Diagnosis Date Noted   Urinary, incontinence, stress female 09/10/2021   Osteoarthritis of carpometacarpal ([facility] joint of right thumb 09/10/2021   Left rib fracture 02/08/2021   Pre-diabetes 12/23/2020   Mild cognitive impairment 12/23/2020   Generalized anxiety disorder    Varicose veins of both lower extremities with pain 05/11/2020   Chronic rhinitis 03/10/2020   Elevated blood pressure reading without diagnosis of hypertension 03/10/2020   Health maintenance examination 09/05/2019   GERD (gastroesophageal reflux disease) 09/05/2019   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   Situational stress 07/10/2019   Osteopenia 09/03/2018   HLD (hyperlipidemia) 09/03/2018   Vitamin D deficiency 09/03/2018   Leg cramping 08/15/2018  Hypothyroidism 08/15/2018   TMJ tenderness, right 02/08/2018   Lactose intolerance 02/08/2018   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   Chronic nasal congestion 04/19/2017   Exertional dyspnea 04/19/2017   Cervical neck pain with evidence of disc disease 08/05/2016   Advanced care planning/counseling discussion 07/31/2014   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   Osteoarthritis of knee 03/18/2013   Postoperative nausea and vomiting    Transfusion of blood product refused for religious reason    Medicare annual wellness visit, subsequent 01/14/2013   Elevated alkaline phosphatase level 01/03/2013   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 No  OCCUPATION/SOCIAL ACTIVITIES: swimming, walking, dinner, playing cards  PLOF: Independent  PERTINENT HISTORY/CHART REVIEW: From 02/08/21 note with [name]: Diagnosed with mild cognitive impairment of unclear etiology, related to anxiety > depression. Not consistent of alzheimers or other neurodegenerative illness at this time.   12/30/20 note with [name] - Neurology - Pt presented for mild cognitive impairment. Discussed results.   CHIEF CONCERN: Pt has constant urinary leakage with coughing/laughing/sneezing/jumping. Pt has not noticed urinary leakage with walking, but her pantyliner is soiled enough where she needs to change it. Pt has had leakage more than 3 years ago but it is now more constant. Pt has had instances of bowel leakage, but not regularly. Pt has had to change undergarments because she feels it is just gas but it is not. Pt does notice when she goes to urinate and then wipes there is a bowel stain although she did not have a BM.   Pain type: aching Pain description: constant   Aggravating factors: prolonged sitting, household chores  Relieving factors: dry needling. Chiropractor, heat  LIVING ENVIRONMENT: Lives with: lives alone, has a son in [place]  Lives in: House/apartment   PATIENT GOALS: Increase strength in the deep core, I would like to not leak and use of pantyliners or smelling like urine     UROLOGICAL HISTORY Fluid intake: flavored water (1-2/day), 2 cups of coffee   Pain with urination: No Fully empty bladder: No Stream: stop and go  Urgency: No Frequency: 5-6x Nocturia: 0x Leakage: Coughing, Sneezing, Laughing, Exercise, and Lifting Pads: Yes Type:  pantyliners Amount: 3x/day, soiled, wearing through the night  Bladder control (0-10): 5/10   GASTROINTESTINAL HISTORY Leakage: Yes but very rarely  Pads: No    SEXUAL HISTORY/FUNCTION Pt has no concerns   OBSTETRICAL HISTORY Vaginal deliveries: G3P3 Tearing: Yes: stiches with first child    GYNECOLOGICAL HISTORY Hysterectomy: yes, partial hysterectomy  Pelvic Organ Prolapse: None Heaviness/pressure: no   SUBJECTIVE:  Pt has noticed a decrease in urinary leakage. Pt has also practiced body mechanics learned on Friday and that has helped too.             PAIN:  Are you having pain? No NPRS: 0/10  OBJECTIVE:   COGNITION: Overall cognitive status: History of cognitive impairments - at baseline -see above in pertinent history/chart review    POSTURE:  Iliac crest height: L iliac crest higher   Pelvic obliquity: L posteriorly rotated   SENSATION:  Light touch: intact, L2-S2 dermatomes     RANGE OF MOTION:    (Norm range in degrees)  LEFT 10/07/21 RIGHT 10/07/21  Lumbar forward flexion (65):  WNL    Lumbar extension (30): WNL    Lumbar lateral flexion (25):  WNL WNL  Thoracic and Lumbar rotation (30 degrees):    WNL WNL  Hip Flexion (0-125):   Restricted* WNL*  Hip IR (0-45):  WNL WNL  Hip ER (0-45):  WNL WNL  Hip Adduction:      Hip Abduction (0-40):  Restricted* WNL  Hip extension (0-15):     (*= pain, Blank rows = not tested)   STRENGTH: MMT    RLE 10/07/21 LLE 10/07/21  Hip Flexion 4 4  Hip Extension 4 4  Hip Abduction     Hip Adduction     Hip ER  5 4  Hip IR  5 4*  Knee Extension 5 5  Knee Flexion 4 4  Dorsiflexion     Plantarflexion (seated) 5 5  (*= pain, Blank rows = not tested)   SPECIAL TESTS:   FABER (SN 81): negative B FADIR (SN 94): negative B  PALPATION:   Abdominal:  Diastasis:  2 fingers below umbilicus, Valsalva maneuver noted Rib flare: present B  EXTERNAL PELVIC EXAM: Patient educated on the purpose of the pelvic exam and articulated understanding; patient consented to the exam verbally.  Breath coordination: inconsistent  Voluntary Contraction: present, 2/5 MMT but felt spasm of PFM Relaxation: delayed Perineal movement with sustained IAP increase ("bear down"): minimal descent, Valsalva maneuver noted Perineal movement with rapid IAP increase ("cough"): no change (0= no contraction, 1= flicker, 2= weak squeeze, 3= fair squeeze with lift, 4= good squeeze and lift against resistance, 5= strong squeeze against strong resistance)    TODAY'S TREATMENT  Neuromuscular Re-education: Supine hooklying diaphragmatic breathing with VCs and TCs for downregulation of the nervous system and improved management of IAP  [facility] abdominal rehab   Supine hooklying TrA contraction with coordinated exhale   Supine hooklying TrA contraction with bent knee fall outs  Discussion on how decreased neck ROM can impact shoulder height as Pt continues to demonstrate increased L shoulder height. Seated upper trapezius pose for improved tissue extensibility and pain modulation   Patient response to interventions: Pt able to feel subtle tightening of TrA in supine    Patient Education:  Patient provided with HEP including: supine TrA activation and TrA activation with bent knee fall outs, seated upper trap.  Patient educated throughout session on appropriate technique and form using multi-modal cueing, HEP, and activity modification. Patient will continue to benefit from further education in order to maximize compliance and understanding for long-term therapeutic gains.    ASSESSMENT:  Clinical Impression: Patient with excellent motivation to participate in today's session. Pt continues to demonstrate deficits in IAP management, PFM coordination, PFM  strength, posture, pain, and LE strength/ROM. Pt reports no lingering lower back pain after practicing body mechanics this past weekend with household chores (vacuuming/cleaning). Pt required moderate VCs and TCs for TrA activation and demonstrated understanding with bent knee fall outs with minimal bodily compensations. Discussion on bodily compensations (L shoulder elevation) in relation to decreased neck ROM. Pt responded positively to active and  educational interventions. Patient will continue to benefit from skilled therapeutic intervention to address deficits in IAP management, PFM coordination, PFM strength, PFM extensibility, posture, pain, and LE strength/ROM in order to increase PLOF and improve overall QOL.    Objective Impairments: decreased activity tolerance, decreased coordination, decreased endurance, decreased strength, improper body mechanics, postural dysfunction, and pain.   Activity Limitations: lifting, bending, squatting, continence, toileting, and locomotion level  Personal Factors: Age, Past/current experiences, Time since onset of injury/illness/exacerbation, and 3+ comorbidities: mild cognitive impairment, HTN, osteopenia  are also affecting patient's functional outcome.   Rehab Potential: Good  Clinical Decision Making: Evolving/moderate complexity  Evaluation Complexity: Moderate   GOALS: Goals reviewed with patient? Yes  SHORT TERM GOALS: Target date: 11/05/21  Patient will demonstrate independent and coordinated diaphragmatic breathing in supine with a 1:2 breathing pattern for improved down-regulation of the nervous system and improved management of intra-abdominal pressures in order to increase function at home and in the community. Baseline: (8/3): Valsalva maneuver throughout session and contracted rectus/obliques with inhalation; (9/1): able to demonstrate diaphragmatic breathing with no cueing  Goal status: MET    LONG TERM GOALS: Target date:  12/17/21  Patient will decrease worst lumbar spine pain as reported on NPRS by at least 2 points to demonstrate clinically significant reduction in pain in order to restore/improve function and overall QOL. Baseline: 10/10  Goal status: INITIAL  2.  Patient will report being able to return to activities including, but not limited to: walking, reading, household chores, and prolonged physical activity without pain or limitation to indicate improved core strengthening and return to prior level of participation at home and in the community. Baseline: moderate-quite a bit of difficulty performing household activities, sitting for long periods of time Goal status: INITIAL  3.  Patient will report decreased reliance on protective undergarments as indicated by a 24 hour period to demonstrate improved bladder control and allow for increased participation in activities outside of the home. Baseline: pantyliners/3x per day  Goal status: INITIAL  4.  Patient will report less than 5 incidents of stress urinary incontinence over the course of 3 weeks while coughing/sneezing/laughing/prolonged activity in order to demonstrate improved PFM coordination, strength, and function for improved overall QOL. Baseline: leakage occurs every time with all the above Goal status: INITIAL  5.  Patient will report confidence in ability to control bladder > 7/10 in order to demonstrate improved function and ability to participate more fully in activities at home and in the community. Baseline: 5/10 Goal status: INITIAL    PLAN: PT Frequency: 1x/week  PT Duration: 12 weeks  Planned Interventions: Therapeutic exercises, Therapeutic activity, Neuromuscular re-education, Balance training, Gait training, Patient/Family education, Self Care, Joint mobilization, Cryotherapy, Moist heat, scar mobilization, Taping, and Manual therapy  Plan For Next Session: continue deep core, neck ROM?    Ayano Douthitt, PT,  DPT  11/09/2021, 9:27 AM

## 2021-11-12 ENCOUNTER — Ambulatory Visit (INDEPENDENT_AMBULATORY_CARE_PROVIDER_SITE_OTHER): Payer: Medicare Other | Admitting: Pharmacist

## 2021-11-12 DIAGNOSIS — E785 Hyperlipidemia, unspecified: Secondary | ICD-10-CM

## 2021-11-12 DIAGNOSIS — M85859 Other specified disorders of bone density and structure, unspecified thigh: Secondary | ICD-10-CM

## 2021-11-12 DIAGNOSIS — I10 Essential (primary) hypertension: Secondary | ICD-10-CM

## 2021-11-12 DIAGNOSIS — F330 Major depressive disorder, recurrent, mild: Secondary | ICD-10-CM

## 2021-11-12 NOTE — Patient Instructions (Addendum)
Visit Information  Phone number for Pharmacist: [phone]   Goals Addressed   None     Care Plan : [place]  Updates made by Charlton Haws, [place] since 11/12/2021 12:00 AM     Problem: Hypertension, Hyperlipidemia, GERD, Anxiety, and Osteopenia   Priority: High     Long-Range Goal: Disease Management   Start Date: 04/19/2021  Expected End Date: 11/13/2022  This Visit's Progress: On track  Recent Progress: On track  Priority: High  Note:   Current Barriers:  Suboptimal therapeutic regimen for GERD  Pharmacist Clinical Goal(s):  Patient will contact provider office for questions/concerns as evidenced notation of same in electronic health record through collaboration with PharmD and provider.   Interventions: 1:1 collaboration with [name], MD regarding development and update of comprehensive plan of care as evidenced by provider attestation and co-signature Inter-disciplinary care team collaboration (see longitudinal plan of care) Comprehensive medication review performed; medication list updated in electronic medical record  Hypertension (BP goal <130/80) -Controlled - per home readings -BP managed by cardiology (Dr Rockey Situ) -Current home BP readings: 108/59, 75 today; range 110-120s -Current treatment: HCTZ 12.5 mg daily - Appropriate, Effective, Safe, Accessible Losartan 25 mg daily -Appropriate, Effective, Safe, Accessible -Medications previously tried: n/a  -Educated on BP goals and benefits of medications for prevention of heart attack, stroke and kidney damage; Importance of home blood pressure monitoring; Symptoms of hypotension and importance of maintaining adequate hydration; -Counseled to monitor BP at home daily -Recommend to continue current medication  Hyperlipidemia: (LDL goal < 100) -Controlled - LDL 134 (08/2021), adequate control for age, given statin intolerance and limited treatment options  -Current treatment: Ezetimibe 10  mg daily - Appropriate, Effective, Safe, Accessible OTC Niacin 500 mg daily - Query appropriate Coenzyme Q10 -Query appropriate Aspirin 81 mg daily - Appropriate, Effective, Safe, Accessible -Medications previously tried: pravastatin, atorvastatin  -Current dietary patterns: limits foods high in cholesterol -Current exercise habits: walks three times weekly -Previously discussed limited efficacy of niacin, CoQ10. Pt prefers to continue taking them. -Recommend to continue current medication  Depression/Anxiety (Goal: 50% or greater reduction in PHQ-9 and GAD-7) -Controlled - pt reports significant improvement in anxiety since starting Buspar; she also reports tremors have improved since reducing sertraline back to 37.5 mg  -PHQ9: 9 (09/08/20) - mild depression -GAD7: 14 (09/08/20) - moderate anxiety -Current treatment: Sertraline 25 mg - 1.5 tab daily - Appropriate, Effective, Safe, Accessible Buspirone 5 mg BID - Appropriate, Effective, Safe, Accessible -Medications previously tried/failed: lexapro, celexa, wellbutrin -Educated on Benefits of medication for symptom control; Benefits of cognitive-behavioral therapy with or without medication -Recommend to continue current medication  Osteopenia (Goal prevent fractures) -Not ideally controlled -Last DEXA Scan: 03/2021   T-Score femoral neck: -2.2  T-Score total hip: -2.2  T-Score lumbar spine: -1.4  10-year probability of major osteoporotic fracture: 24.7%  10-year probability of hip fracture: 7.3% -Patient is a candidate for pharmacologic treatment due to T-Score -1.0 to -2.5 and 10-year risk of major osteoporotic fracture > 20% and T-Score -1.0 to -2.5 and 10-year risk of hip fracture > 3% -Current treatment  Calcium carbonate 600 mg daily - Appropriate, Effective, Safe, Accessible Vitamin D 5000 IU -Appropriate, Effective, Safe, Accessible -Medications previously tried: n/a  -Discussed she is a candidate for treatment; benefits of  pharmacologic treatment to prevent fractures; pt has declined treatment after discussion with PCP  GERD (Goal: manage symptoms) -Uncontrolled - pt reports worsening reflux lately; she feels famotidine does not help  when taken as needed after symptoms develop;  -Triggers: sugar/sweets -Current treatment  Famotidine 20 mg PRN -Appropriate, Query Effective -Medications previously tried: omeprazole -Discussed avoidance of trigger foods; avoid laying down within 1 hr of eating -Recommend trial of OTC omeprazole 20 mg daily x 2 weeks; pt will contact office if she wants to continue omeprazole  Patient Goals/Self-Care Activities Patient will:  - take medications as prescribed as evidenced by patient report and record review focus on medication adherence by routine check blood pressure periodically, document, and provide at future appointments      Patient verbalizes understanding of instructions and care plan provided today and agrees to view in [place]. Active MyChart status and patient understanding of how to access instructions and care plan via MyChart confirmed with patient.    The patient has been provided with contact information for the care management team and has been advised to call with any health related questions or concerns.    [name], PharmD, BCACP Clinical Pharmacist [facility] [phone]

## 2021-11-12 NOTE — Progress Notes (Signed)
Chronic Care Management Pharmacy Note  11/12/2021 Name:  Kristin Kramer MRN:  [id number] DOB:  1940-12-08  Summary: CCM F/U visit -Reviewed medications; pt affirms compliance as prescribed -Pt reports reflux is worsening lately, she is just using famotidine 20 mg PRN. She stopped omeprazole earlier this year when reflux was controlled. Discussed trigger food avoidance and not laying down within 1 hr of eating  Recommendations/Changes made from today's visit: -Advised trial of OTC omeprazole 20 mg daily in AM x 2 weeks;  pt will contact office if she wants to continue omeprazole  Plan: -[place] will call patient 2 weeks re: omeprazole trial -PCP visit 09/13/22 (AWV)    Subjective: Kristin Kramer is an 81 y.o. year old female who is a primary patient of [name], MD.  The CCM team was consulted for assistance with disease management and care coordination needs.    Engaged with patient by telephone for follow up visit in response to provider referral for pharmacy case management and/or care coordination services.   Consent to Services:  The patient was given information about Chronic Care Management services, agreed to services, and gave verbal consent prior to initiation of services.  Please see initial visit note for detailed documentation.   Patient Care Team: [name], MD as PCP - General (Family Medicine) [name], [place] as Consulting Physician (Optometry) Charlton Haws, [facility] as Pharmacist (Pharmacist)  Recent office visits: 09/10/21 Dr [name] OV: annual - reviewed DEXA. Pt declines treatment. Referred for PFPT for OAB. Try voltaren gel for arthritis. Increase levothyroxine to 50 mcg. Stop Aricept.  03/20/21 - [name], MD, PCP - Mychart msg - Your bone density scan returned showing stable osteopenia - however with increased risk of fracture. For this reason we should consider medication to strengthen bones like weekly  fosamax or prolia injection every 6 months. If interested, schedule an office visit to review treatment options.   02/08/21 - [name], MD, PCP - Pt presented for mild cognitive impairment. Increase sertraline to 50 mg. Refer to counseling. Work towards better anxiety management, reassess memory afterwards. Discussed recent rib fractures. Recommend scheduled tylenol 537m BID to TID and continue tramadol PRN breakthrough pain. Follow up 6 months.  02/03/21 - SOwens Loffler MD - Pt presented for rib pain. Continue tramadol PRN.  09/08/20 - Pt presented for Medicare AWV. Pt notices progressive difficulty with memory which is concerning. Will refer for neurocognitive evaluation.   Recent consult visits: 05/27/21 Dr GRockey Situ(Cardiology):f/u - refilled HCTZ, losartan. D/c omeprazole and tramadol (not taking). Advised to increase fluid intake for low BP at home.  12/30/20 - Neurology - Pt presented for mild cognitive impairment. Discussed results.  12/23/20 - Neurology - Recommend see PCP for anxiety/depression treatment.   Hospital visits: None in previous 6 months   Objective:  Lab Results  Component Value Date   CREATININE 0.73 09/02/2021   BUN 21 09/02/2021   GFR 77.60 09/02/2021   GFRNONAA >60 08/08/2019   GFRAA >60 08/08/2019   NA 139 09/02/2021   K 4.1 09/02/2021   CALCIUM 9.8 09/02/2021   CO2 31 09/02/2021   GLUCOSE 106 (H) 09/02/2021    Lab Results  Component Value Date/Time   HGBA1C 5.8 09/02/2021 07:43 AM   HGBA1C 5.7 07/10/2019 11:18 AM   GFR 77.60 09/02/2021 07:43 AM   GFR 64.19 09/01/2020 08:14 AM     Lab Results  Component Value Date   CHOL 226 (H) 09/02/2021   HDL 64.50  09/02/2021   LDLCALC 134 (H) 09/02/2021   LDLDIRECT 146.4 08/13/2012   TRIG 140.0 09/02/2021   CHOLHDL 4 09/02/2021       Latest Ref Rng & Units 09/02/2021    7:43 AM 09/01/2020    8:14 AM 08/29/2019    8:10 AM  Hepatic Function  Total Protein 6.0 - 8.3 g/dL 7.1  7.1  7.3   Albumin  3.5 - 5.2 g/dL 4.6  4.6  4.5   AST 0 - 37 U/L 21  24  28    ALT 0 - 35 U/L 17  21  22    Alk Phosphatase 39 - 117 U/L 105  111  255   Total Bilirubin 0.2 - 1.2 mg/dL 0.6  0.4  0.6     Lab Results  Component Value Date/Time   TSH 5.70 (H) 09/02/2021 07:43 AM   TSH 3.41 09/01/2020 08:14 AM   FREET4 1.12 08/29/2019 08:10 AM   FREET4 0.66 12/17/2018 08:13 AM       Latest Ref Rng & Units 09/01/2020    8:14 AM 08/29/2019    8:10 AM 08/08/2019    2:40 AM  CBC  WBC 4.0 - 10.5 K/uL 6.1  6.0  8.3   Hemoglobin 12.0 - 15.0 g/dL 13.4  12.5  10.4   Hematocrit 36.0 - 46.0 % 39.7  37.9  32.7   Platelets 150.0 - 400.0 K/uL 240.0  427.0  185     Lab Results  Component Value Date/Time   VD25OH 70.15 09/02/2021 07:43 AM   VD25OH 60.76 09/01/2020 08:14 AM    Clinical ASCVD: No  The ASCVD Risk score (Arnett DK, et al., 2019) failed to calculate for the following reasons:   The 2019 ASCVD risk score is only valid for ages 15 to 48       09/10/2021    9:36 AM 09/08/2020   10:38 AM 08/29/2019    9:53 AM  Depression screen PHQ 2/9  Decreased Interest 1 0 0  Down, Depressed, Hopeless 1 1 0  PHQ - 2 Score 2 1 0  Altered sleeping 0 2 0  Tired, decreased energy 1 3 0  Change in appetite 1 0 0  Feeling bad or failure about yourself  0 0 0  Trouble concentrating 3 3 0  Moving slowly or fidgety/restless 0 0 0  Suicidal thoughts 0 0 0  PHQ-9 Score 7 9 0  Difficult doing work/chores Not difficult at all  Not difficult at all       09/10/2021    9:38 AM 09/08/2020   10:38 AM  GAD 7 : Generalized Anxiety Score  Nervous, Anxious, on Edge 0 2  Control/stop worrying 1 1  Worry too much - different things 1 3  Trouble relaxing 0 2  Restless 0 0  Easily annoyed or irritable 0 3  Afraid - awful might happen 0 3  Total GAD 7 Score 2 14  Anxiety Difficulty Not difficult at all     Social History   Tobacco Use  Smoking Status Never  Smokeless Tobacco Never   BP Readings from Last 3 Encounters:   09/10/21 122/68  05/27/21 130/62  02/08/21 120/80   Pulse Readings from Last 3 Encounters:  09/10/21 74  02/08/21 75  02/03/21 94   Wt Readings from Last 3 Encounters:  09/10/21 147 lb 6 oz (66.8 kg)  05/27/21 152 lb (68.9 kg)  02/08/21 152 lb 9 oz (69.2 kg)   BMI Readings from Last 3  Encounters:  09/10/21 24.91 kg/m  05/27/21 25.29 kg/m  02/08/21 25.78 kg/m    Assessment/Interventions: Review of patient past medical history, allergies, medications, health status, including review of consultants reports, laboratory and other test data, was performed as part of comprehensive evaluation and provision of chronic care management services.   SDOH:  (Social Determinants of Health) assessments and interventions performed: No - done 04/2021 SDOH Interventions    Flowsheet Row Chronic Care Management from 04/19/2021 in [place] at [place] from 08/29/2019 in [place] at [place] from 08/15/2018 in [place] at [place] from 08/05/2016 in [place] at [place] Interventions      Transportation Interventions Intervention Not Indicated -- -- --  Depression Interventions/Treatment  -- MCN4-7 Score <4 Follow-up Not Indicated PHQ2-9 Score <4 Follow-up Not Indicated --  [pt is being referred to PCP for further evaluation]  Financial Strain Interventions Intervention Not Indicated -- -- --       SDOH Screenings   Food Insecurity: No Food Insecurity (08/29/2019)  Housing: Low Risk  (08/29/2019)  Transportation Needs: No Transportation Needs (04/19/2021)  Alcohol Screen: Low Risk  (08/29/2019)  Depression (PHQ2-9): Medium Risk (09/10/2021)  Financial Resource Strain: Low Risk  (04/19/2021)  Physical Activity: Insufficiently Active (08/29/2019)  Stress: No Stress Concern Present (08/29/2019)  Tobacco Use: Low Risk  (11/09/2021)    [place]  Allergies  Allergen Reactions   Blood-Group  Specific Substance    Lipitor [Atorvastatin] Other (See Comments)    Cramps   Pravastatin Other (See Comments)    Cramps     Medications Reviewed Today     Reviewed by Charlton Haws, [facility] (Pharmacist) on 11/12/21 at 1501  Med List Status: <None>   Medication Order Taking? Sig Documenting Provider Last Dose Status Informant  aspirin EC 81 MG tablet [id number] Yes Take 81 mg by mouth daily. Swallow whole. [name], MD Taking Active            Med Note ([place]   Tue Jun 02, 2020  9:45 AM)    busPIRone (BUSPAR) 5 MG tablet [id number] Yes Take 1 tablet (5 mg total) by mouth 2 (two) times daily as needed (anxiety). [name], MD Taking Active   calcium carbonate (OSCAL) 1500 (600 Ca) MG TABS tablet [id number] Yes Take 600 mg of elemental calcium by mouth daily with breakfast. [provider] Taking Active Self  Cholecalciferol (VITAMIN D-3) 5000 units TABS [id number] Yes Take 5,000 Units by mouth daily.  [provider] Taking Active Self  Coenzyme Q10 (COQ10 PO) [id number] Yes Take 1 capsule by mouth daily.  [provider] Taking Active Self  ezetimibe (ZETIA) 10 MG tablet [id number] Yes Take 1 tablet (10 mg total) by mouth daily. [name], MD Taking Active   famotidine (PEPCID) 20 MG tablet [id number] Yes Take 20 mg by mouth daily as needed for heartburn or indigestion. [provider] Taking Active   fexofenadine [facility] ALLERGY) 180 MG tablet [id number] Yes Take 1 tablet (180 mg total) by mouth daily. [name], MD Taking Active   hydrochlorothiazide (MICROZIDE) 12.5 MG capsule [id number]  Take 1 capsule (12.5 mg total) by mouth daily. Minna Merritts, MD  Expired 08/24/21 2359   levothyroxine (SYNTHROID) 50 MCG tablet [id number] Yes Take 1 tablet (50 mcg total) by mouth daily before breakfast. [name], MD Taking Active   losartan (COZAAR) 25 MG tablet [id number]  Take 1 tablet (  25 mg total) by mouth daily.  Minna Merritts, MD  Expired 08/24/21 2359   Magnesium 250 MG TABS [id number] Yes Take 1 tablet (250 mg total) by mouth daily. [name], MD Taking Active   METAMUCIL FIBER PO [id number] Yes Take by mouth daily. [provider] Taking Active   niacinamide 500 MG tablet [id number] Yes Take 1 tablet (500 mg total) by mouth daily. [name], MD Taking Active            Med Note Ronaldo Miyamoto   Tue Jun 02, 2020  9:46 AM)    Potassium 99 MG TABS [id number] Yes Take 1 tablet (99 mg total) by mouth daily. [name], MD Taking Active   prednisoLONE acetate (PRED FORTE) 1 % ophthalmic suspension [id number] Yes  [provider] Taking Active   Probiotic Product (PROBIOTIC PO) [id number] Yes Take 1 capsule by mouth daily.  [provider] Taking Active Self  sertraline (ZOLOFT) 25 MG tablet [id number] Yes Take 1.5 tablets (37.5 mg total) by mouth at bedtime. [name], MD Taking Active   TURMERIC PO [id number] Yes Take 900 mg by mouth daily.  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 09/10/2021   Left rib fracture 02/08/2021   Pre-diabetes 12/23/2020   Mild cognitive impairment 12/23/2020   Generalized anxiety disorder    Varicose veins of both lower extremities with pain 05/11/2020   Chronic rhinitis 03/10/2020   Elevated blood pressure reading without diagnosis of hypertension 03/10/2020   Health maintenance examination 09/05/2019   GERD (gastroesophageal reflux disease) 09/05/2019   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   Situational stress 07/10/2019   Osteopenia 09/03/2018   HLD (hyperlipidemia) 09/03/2018   Vitamin D deficiency 09/03/2018   Leg cramping 08/15/2018   Hypothyroidism 08/15/2018   TMJ tenderness, right 02/08/2018   Lactose  intolerance 02/08/2018   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   Chronic nasal congestion 04/19/2017   Exertional dyspnea 04/19/2017   Cervical neck pain with evidence of disc disease 08/05/2016   Advanced care planning/counseling discussion 07/31/2014   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   Osteoarthritis of knee 03/18/2013   Postoperative nausea and vomiting    Transfusion of blood product refused for religious reason    Medicare annual wellness visit, subsequent 01/14/2013   Elevated alkaline phosphatase level 01/03/2013   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   Seasonal allergies    Squamous cell carcinoma of skin    Major depressive disorder    Abnormal EKG     Immunization History  Administered Date(s) Administered   Influenza Split 12/01/2011   Influenza, High Dose Seasonal PF 02/10/2014, 11/27/2017, 11/28/2018, 11/26/2019, 11/10/2020   Influenza,inj,Quad PF,6+ Mos 01/14/2013, 12/29/2014, 02/04/2016   Influenza-Unspecified 01/03/2017   PFIZER Comirnaty(Gray Top)Covid-19 Tri-Sucrose Vaccine 07/14/2020   PFIZER(Purple Top)SARS-COV-2 Vaccination 04/15/2019, 05/06/2019, 12/31/2019   Pfizer Covid-19 Vaccine Bivalent Booster 25yr & up 01/14/2021   Pneumococcal Conjugate-13 07/31/2014   Pneumococcal Polysaccharide-23 12/01/2011   Tdap 10/22/2014   Zoster Recombinat (Shingrix) 11/29/2016, 10/21/2017   Zoster, Live 02/14/2013    Conditions to be addressed/monitored:  Hypertension, Hyperlipidemia, GERD, Anxiety, and Osteopenia  Care Plan : [place] Updates made by FCharlton Haws RCalaverassince 11/12/2021 12:00 AM     Problem: Hypertension,  Hyperlipidemia, GERD, Anxiety, and Osteopenia   Priority: High     Long-Range Goal: Disease Management   Start Date: 04/19/2021  Expected End Date: 11/13/2022  This Visit's Progress: On track  Recent Progress: On track  Priority: High  Note:   Current Barriers:  Suboptimal therapeutic regimen  for GERD  Pharmacist Clinical Goal(s):  Patient will contact provider office for questions/concerns as evidenced notation of same in electronic health record through collaboration with PharmD and provider.   Interventions: 1:1 collaboration with [name], MD regarding development and update of comprehensive plan of care as evidenced by provider attestation and co-signature Inter-disciplinary care team collaboration (see longitudinal plan of care) Comprehensive medication review performed; medication list updated in electronic medical record  Hypertension (BP goal <130/80) -Controlled - per home readings -BP managed by cardiology (Dr Rockey Situ) -Current home BP readings: 108/59, 75 today; range 110-120s -Current treatment: HCTZ 12.5 mg daily - Appropriate, Effective, Safe, Accessible Losartan 25 mg daily -Appropriate, Effective, Safe, Accessible -Medications previously tried: n/a  -Educated on BP goals and benefits of medications for prevention of heart attack, stroke and kidney damage; Importance of home blood pressure monitoring; Symptoms of hypotension and importance of maintaining adequate hydration; -Counseled to monitor BP at home daily -Recommend to continue current medication  Hyperlipidemia: (LDL goal < 100) -Controlled - LDL 134 (08/2021), adequate control for age, given statin intolerance and limited treatment options  -Current treatment: Ezetimibe 10 mg daily - Appropriate, Effective, Safe, Accessible OTC Niacin 500 mg daily - Query appropriate Coenzyme Q10 -Query appropriate Aspirin 81 mg daily - Appropriate, Effective, Safe, Accessible -Medications previously tried: pravastatin, atorvastatin  -Current dietary patterns: limits foods high in cholesterol -Current exercise habits: walks three times weekly -Previously discussed limited efficacy of niacin, CoQ10. Pt prefers to continue taking them. -Recommend to continue current medication  Depression/Anxiety (Goal: 50%  or greater reduction in PHQ-9 and GAD-7) -Controlled - pt reports significant improvement in anxiety since starting Buspar; she also reports tremors have improved since reducing sertraline back to 37.5 mg  -PHQ9: 9 (09/08/20) - mild depression -GAD7: 14 (09/08/20) - moderate anxiety -Current treatment: Sertraline 25 mg - 1.5 tab daily - Appropriate, Effective, Safe, Accessible Buspirone 5 mg BID - Appropriate, Effective, Safe, Accessible -Medications previously tried/failed: lexapro, celexa, wellbutrin -Educated on Benefits of medication for symptom control; Benefits of cognitive-behavioral therapy with or without medication -Recommend to continue current medication  Osteopenia (Goal prevent fractures) -Not ideally controlled -Last DEXA Scan: 03/2021   T-Score femoral neck: -2.2  T-Score total hip: -2.2  T-Score lumbar spine: -1.4  10-year probability of major osteoporotic fracture: 24.7%  10-year probability of hip fracture: 7.3% -Patient is a candidate for pharmacologic treatment due to T-Score -1.0 to -2.5 and 10-year risk of major osteoporotic fracture > 20% and T-Score -1.0 to -2.5 and 10-year risk of hip fracture > 3% -Current treatment  Calcium carbonate 600 mg daily - Appropriate, Effective, Safe, Accessible Vitamin D 5000 IU -Appropriate, Effective, Safe, Accessible -Medications previously tried: n/a  -Discussed she is a candidate for treatment; benefits of pharmacologic treatment to prevent fractures; pt has declined treatment after discussion with PCP  GERD (Goal: manage symptoms) -Uncontrolled - pt reports worsening reflux lately; she feels famotidine does not help when taken as needed after symptoms develop;  -Triggers: sugar/sweets -Current treatment  Famotidine 20 mg PRN -Appropriate, Query Effective -Medications previously tried: omeprazole -Discussed avoidance of trigger foods; avoid laying down within 1 hr of eating -Recommend trial of OTC omeprazole 20 mg  daily x 2  weeks; pt will contact office if she wants to continue omeprazole  Patient Goals/Self-Care Activities Patient will:  - take medications as prescribed as evidenced by patient report and record review focus on medication adherence by routine check blood pressure periodically, document, and provide at future appointments      Medication Assistance: None required.  Patient affirms current coverage meets needs.  Compliance/Adherence/Medication fill history: Care Gaps: None  Star-Rating Drugs: Losartan - PDC 100%  Medication Access: Within the past 30 days, how often has patient missed a dose of medication? 0 Is a pillbox or other method used to improve adherence? Yes  Factors that may affect medication adherence? no barriers identified Are meds synced by current pharmacy? No  Are meds delivered by current pharmacy? No  Does patient experience delays in picking up medications due to transportation concerns? No   [facility] Services Reviewed: Is patient disadvantaged to use [facility]?: No  Current Rx insurance plan: [facility] Name and location of Current pharmacy:  [id number] [id number]: [phone]: [phone] [facility] services reviewed with patient today?: No  Patient requests to transfer care to [facility]?: No  Reason patient declined to change pharmacies: Not mentioned at this visit   Care Plan and Follow Up Patient Decision:  Patient agrees to Care Plan and Follow-up.  Follow Up Plan: The patient has been provided with contact information for the care management team and has been advised to call with any health related questions or concerns.   LCharlene Brooke PharmD, BCACP Clinical Pharmacist [phone]

## 2021-11-18 ENCOUNTER — Ambulatory Visit: Payer: Medicare Other

## 2021-11-18 DIAGNOSIS — M6289 Other specified disorders of muscle: Secondary | ICD-10-CM

## 2021-11-18 DIAGNOSIS — R278 Other lack of coordination: Secondary | ICD-10-CM

## 2021-11-18 DIAGNOSIS — M6281 Muscle weakness (generalized): Secondary | ICD-10-CM

## 2021-11-18 NOTE — Therapy (Signed)
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 left axis deviation;  b. 12/2011 Echo: EF 55-60%, no rwma, pasp 86mHg.   Abnormal EKG    Chronic LAFB, incomplete RBBB Has been previously cleared by cardiology for surgery ([place] Situ2013, 2015)   Basal cell carcinoma 02/2021   R nose tip ([place])   Cervical neck pain with evidence of disc disease 08/05/2016   Last Assessment & Plan:  Ongoing cervical neck pain after fall. NSAIDs effective for relief. She has started seeing chiropractor. Reviewed ER workup including CT showing evidence of bulging disc and disc space loss.   Chronic nasal congestion 04/19/2017   Ongoing since sinus surgery 05/2016  S/p ENT and allergist eval.    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 Recovering well from this.   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   S/p bilateral knee replacement  Formatting of this note might be different from the original. 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 Whitworth/[place]), R tibia x4+, L tibia x2, L dorsal hand   TMJ tenderness, right 02/08/2018   Tubular adenoma of colon 08/2014   Varicose veins of both lower extremities with pain 05/11/2020   Vitamin D deficiency    Past Surgical History:  Procedure Laterality Date   AUGMENTATION MAMMAPLASTY     BREAST ENHANCEMENT SURGERY     BUNIONECTOMY  2013   R and L foot   CATARACT EXTRACTION Bilateral 2013   R, pending L   COLONOSCOPY  2007   no records received   COLONOSCOPY  08/2014    tubular adenoma, rpt 5 yrs Fuller Plan)   COLONOSCOPY  06/2020   4 polyps removed, TA, no rpt recommended Fuller Plan)   CONVERSION TO TOTAL HIP Right 12/07/2017   Procedure: CONVERSION TO TOTAL HIP ( REMOVING SMITH AND NEPHEW NAIL);  Surgeon: Hessie Knows, MD;  Location: [facility];  Service: Orthopedics;  Laterality: Right;   COSMETIC SURGERY  2002   face lift   dexa  06/2008   T -1.4 spine and hip   ETHMOIDECTOMY Bilateral 05/19/2016   Procedure: ETHMOIDECTOMY;  Surgeon: Carloyn Manner, MD;  Location: [facility];  Service: ENT;  Laterality: Bilateral;   EYE SURGERY     bilateral cataract surgery   FRONTAL SINUS EXPLORATION Bilateral 05/19/2016   Procedure: FRONTAL SINUS EXPLORATION;  Surgeon: Carloyn Manner, MD;  Location: [facility];  Service: ENT;  Laterality: Bilateral;   INTRAMEDULLARY (IM) NAIL INTERTROCHANTERIC Right 07/08/2017   Procedure: RIGHT INTERTROCHANTRIC IM NAIL;  Surgeon: Leandrew Koyanagi, MD;  Location: [place];  Service: Orthopedics;  Laterality: Right;   MAXILLARY ANTROSTOMY Bilateral 05/19/2016   Procedure: MAXILLARY ANTROSTOMY;  Surgeon: Carloyn Manner, MD;  Location: [facility];  Service: ENT;  Laterality: Bilateral;   MOHS SURGERY  2011   R leg   PARTIAL HYSTERECTOMY  1980   fibroids, ovaries remained   SINUS ENDO W/FUSION N/A 05/19/2016   Procedure: ENDOSCOPIC SINUS SURGERY WITH NAVIGATION;  Surgeon: Carloyn Manner, MD;  Location: [facility];  Service: ENT;  Laterality: N/A;   SPHENOIDECTOMY Bilateral 05/19/2016   Procedure: Coralee Pesa;  Surgeon: Carloyn Manner, MD;  Location: [facility];  Service: ENT;  Laterality: Bilateral;   SQUAMOUS CELL CARCINOMA EXCISION Right 07/13/2016   Dr. Rolm Bookbinder, [facility]   TONSILLECTOMY AND [place] Right 08/07/2019   Procedure: Right hip acetabular versus total hip arthroplasty revision-posterior;  Surgeon: Gaynelle Arabian, MD;  Location: [facility];  Service: Orthopedics;  Laterality: Right;   119mn   TOTAL KNEE ARTHROPLASTY  01/02/2012   Right - Surgeon: RLorn Junes MD   TOTAL KNEE ARTHROPLASTY Left 03/18/2013   Surgeon: RLorn Junes MD   Patient Active Problem List   Diagnosis Date Noted   Urinary, incontinence, stress female 09/10/2021   Osteoarthritis of carpometacarpal ([facility] joint of right thumb 09/10/2021   Left rib fracture 02/08/2021   Pre-diabetes 12/23/2020   Mild cognitive impairment 12/23/2020   Generalized anxiety disorder    Varicose veins of both lower extremities with pain 05/11/2020   Chronic rhinitis 03/10/2020   Elevated blood pressure reading without diagnosis of hypertension 03/10/2020   Health maintenance examination 09/05/2019   GERD (gastroesophageal reflux disease) 09/05/2019   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   Situational stress 07/10/2019   Osteopenia 09/03/2018   HLD (hyperlipidemia) 09/03/2018   Vitamin D deficiency 09/03/2018   Leg cramping 08/15/2018  Hypothyroidism 08/15/2018   TMJ tenderness, right 02/08/2018   Lactose intolerance 02/08/2018   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   Chronic nasal congestion 04/19/2017   Exertional dyspnea 04/19/2017   Cervical neck pain with evidence of disc disease 08/05/2016   Advanced care planning/counseling discussion 07/31/2014   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   Osteoarthritis of knee 03/18/2013   Postoperative nausea and vomiting    Transfusion of blood product refused for religious reason    Medicare annual wellness visit, subsequent 01/14/2013   Elevated alkaline phosphatase level 01/03/2013   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 No  OCCUPATION/SOCIAL ACTIVITIES: swimming, walking, dinner, playing cards  PLOF: Independent  PERTINENT HISTORY/CHART REVIEW: From 02/08/21 note with [name]: Diagnosed with mild cognitive impairment of unclear etiology, related to anxiety > depression. Not consistent of alzheimers or other neurodegenerative illness at this time.   12/30/20 note with [name] - Neurology - Pt presented for mild cognitive impairment. Discussed results.   CHIEF CONCERN: Pt has constant urinary leakage with coughing/laughing/sneezing/jumping. Pt has not noticed urinary leakage with walking, but her pantyliner is soiled enough where she needs to change it. Pt has had leakage more than 3 years ago but it is now more constant. Pt has had instances of bowel leakage, but not regularly. Pt has had to change undergarments because she feels it is just gas but it is not. Pt does notice when she goes to urinate and then wipes there is a bowel stain although she did not have a BM.   Pain type: aching Pain description: constant   Aggravating factors: prolonged sitting, household chores  Relieving factors: dry needling. Chiropractor, heat  LIVING ENVIRONMENT: Lives with: lives alone, has a son in [place]  Lives in: House/apartment   PATIENT GOALS: Increase strength in the deep core, I would like to not leak and use of pantyliners or smelling like urine     UROLOGICAL HISTORY Fluid intake: flavored water (1-2/day), 2 cups of coffee   Pain with urination: No Fully empty bladder: No Stream: stop and go  Urgency: No Frequency: 5-6x Nocturia: 0x Leakage: Coughing, Sneezing, Laughing, Exercise, and Lifting Pads: Yes Type:  pantyliners Amount: 3x/day, soiled, wearing through the night  Bladder control (0-10): 5/10   GASTROINTESTINAL HISTORY Leakage: Yes but very rarely  Pads: No    SEXUAL HISTORY/FUNCTION Pt has no concerns   OBSTETRICAL HISTORY Vaginal deliveries: G3P3 Tearing: Yes: stiches with first child    GYNECOLOGICAL HISTORY Hysterectomy: yes, partial hysterectomy  Pelvic Organ Prolapse: None Heaviness/pressure: no   SUBJECTIVE:  Pt had an episode of feeling sick on Monday that continued throughout the week. Pt felt lightheaded, losing her balance, SOB. Pt felt another episode around 3pm where she was lightheaded and felt nauseated. Pt has had no appetite and not feeling herself. Pt denies jaw pain, numbness in the face or difficulty with speech. Pt does have BP  monitor at home and said at one point it was 109/56 mmHg.             PAIN:  Are you having pain? No NPRS: 0/10                                                                                                                                                                              OBJECTIVE:   COGNITION: Overall cognitive status: History of cognitive impairments - at baseline -see above in pertinent history/chart review    POSTURE:  Iliac crest height: L iliac crest higher   Pelvic obliquity: L posteriorly rotated   SENSATION:  Light touch: intact, L2-S2 dermatomes     RANGE OF MOTION:    (Norm range in degrees)  LEFT 10/07/21 RIGHT 10/07/21  Lumbar forward flexion (65):  WNL    Lumbar extension (30): WNL    Lumbar lateral flexion (25):  WNL WNL  Thoracic and Lumbar rotation (30 degrees):    WNL WNL  Hip Flexion (0-125):   Restricted* WNL*  Hip IR (0-45):  WNL WNL  Hip ER (0-45):  WNL WNL  Hip Adduction:      Hip Abduction (0-40):  Restricted* WNL  Hip extension (0-15):     (*= pain, Blank rows = not tested)   STRENGTH: MMT    RLE 10/07/21 LLE 10/07/21  Hip Flexion 4 4  Hip Extension 4 4  Hip Abduction      Hip Adduction     Hip ER  5 4  Hip IR  5 4*  Knee Extension 5 5  Knee Flexion 4 4  Dorsiflexion     Plantarflexion (seated) 5 5  (*= pain, Blank rows = not tested)   SPECIAL TESTS:   FABER (SN 81): negative B FADIR (SN 94): negative B  PALPATION:  Abdominal:  Diastasis:  2 fingers below umbilicus, Valsalva maneuver noted Rib flare: present B  EXTERNAL PELVIC EXAM: Patient educated on the purpose of the pelvic exam and articulated understanding; patient consented to the exam verbally.  Breath coordination: inconsistent  Voluntary Contraction: present, 2/5 MMT but felt spasm of PFM Relaxation: delayed Perineal movement with sustained IAP increase ("bear down"): minimal descent, Valsalva maneuver noted Perineal movement with rapid IAP increase ("cough"): no change (0= no contraction, 1= flicker, 2= weak squeeze, 3= fair squeeze with lift, 4= good squeeze and lift against resistance, 5= strong squeeze against strong resistance)    TODAY'S TREATMENT  Neuromuscular Re-education: Discussion on signs of a stroke and when to seek medical attention as Pt lives alone. Pt denies N/T in arm or LEs, no slurred speech, and no lightheadedness currently.  128/81 mmHg, 71 BPM  Supine  hooklying diaphragmatic breathing with VCs and TCs for downregulation of the nervous system and improved management of IAP  [facility]   Supine hooklying TrA contraction with coordinated exhale   Supine hooklying TrA w/marches    Supine hooklying TrA contraction with UE challenge (supine punch) for improved IAP management    Patient response to interventions: Pt like the UE challenge better than the LE   Patient Education:  Patient provided with HEP including: supine TrA activation with UE/LE challenge. Patient educated throughout session on appropriate technique and form using multi-modal cueing, HEP, and activity modification. Patient will continue to benefit from further education in  order to maximize compliance and understanding for long-term therapeutic gains.    ASSESSMENT:  Clinical Impression: Patient with excellent motivation to participate in today's session. Pt continues to demonstrate deficits in IAP management, PFM coordination, PFM strength, posture, pain, and LE strength/ROM. Pt reports increased lightheadedness and loss of balance (no falls) the past 2-3 days. Pt has felt nauseated and had loss of appetite. Per Pt, reports BP of 109/56 mmHg when she checked since Monday. Pt denies N/T in face/arm and slurred speech. DPT recommended to call MD's office to report incident and when to seek further medical attention as Pt lives alone. BP taken in session: 128/81 mmHg, 71 BPM with no sxs. Pt required moderate VCs and TCs for progression of TrA activation to decrease bodily compensations and proper techniques. Pt responded positively to active and educational interventions. Patient will continue to benefit from skilled therapeutic intervention to address deficits in IAP management, PFM coordination, PFM strength, PFM extensibility, posture, pain, and LE strength/ROM in order to increase PLOF and improve overall QOL.    Objective Impairments: decreased activity tolerance, decreased coordination, decreased endurance, decreased strength, improper body mechanics, postural dysfunction, and pain.   Activity Limitations: lifting, bending, squatting, continence, toileting, and locomotion level  Personal Factors: Age, Past/current experiences, Time since onset of injury/illness/exacerbation, and 3+ comorbidities: mild cognitive impairment, HTN, osteopenia  are also affecting patient's functional outcome.   Rehab Potential: Good  Clinical Decision Making: Evolving/moderate complexity  Evaluation Complexity: Moderate   GOALS: Goals reviewed with patient? Yes  SHORT TERM GOALS: Target date: 11/05/21  Patient will demonstrate independent and coordinated diaphragmatic  breathing in supine with a 1:2 breathing pattern for improved down-regulation of the nervous system and improved management of intra-abdominal pressures in order to increase function at home and in the community. Baseline: (8/3): Valsalva maneuver throughout session and contracted rectus/obliques with inhalation; (9/1): able to demonstrate diaphragmatic breathing with no cueing  Goal status: MET    LONG TERM GOALS: Target date: 12/17/21  Patient will decrease worst lumbar spine pain as reported on NPRS by at least 2 points to demonstrate clinically significant reduction in pain in order to restore/improve function and overall QOL. Baseline: 10/10  Goal status: INITIAL  2.  Patient will report being able to return to activities including, but not limited to: walking, reading, household chores, and prolonged physical activity without pain or limitation to indicate improved core strengthening and return to prior level of participation at home and in the community. Baseline: moderate-quite a bit of difficulty performing household activities, sitting for long periods of time Goal status: INITIAL  3.  Patient will report decreased reliance on protective undergarments as indicated by a 24 hour period to demonstrate improved bladder control and allow for increased participation in activities outside of the home. Baseline: pantyliners/3x per day  Goal status: INITIAL  4.  Patient will report less than 5 incidents of stress urinary incontinence over the course of 3 weeks while coughing/sneezing/laughing/prolonged activity in order to demonstrate improved PFM coordination, strength, and function for improved overall QOL. Baseline: leakage occurs every time with all the above Goal status: INITIAL  5.  Patient will report confidence in ability to control bladder > 7/10 in order to demonstrate improved function and ability to participate more fully in activities at home and in the community. Baseline:  5/10 Goal status: INITIAL    PLAN: PT Frequency: 1x/week  PT Duration: 12 weeks  Planned Interventions: Therapeutic exercises, Therapeutic activity, Neuromuscular re-education, Balance training, Gait training, Patient/Family education, Self Care, Joint mobilization, Cryotherapy, Moist heat, scar mobilization, Taping, and Manual therapy  Plan For Next Session: how was this week, BP?, continue deep core progression    Mickaela Starlin, PT, DPT  11/18/2021, 8:43 AM

## 2021-12-02 ENCOUNTER — Ambulatory Visit: Payer: Medicare Other

## 2021-12-02 DIAGNOSIS — M6281 Muscle weakness (generalized): Secondary | ICD-10-CM | POA: Diagnosis not present

## 2021-12-02 DIAGNOSIS — M6289 Other specified disorders of muscle: Secondary | ICD-10-CM | POA: Diagnosis not present

## 2021-12-02 DIAGNOSIS — R278 Other lack of coordination: Secondary | ICD-10-CM | POA: Diagnosis not present

## 2021-12-02 NOTE — Therapy (Signed)
OUTPATIENT PHYSICAL THERAPY FEMALE PELVIC TREATMENT   Patient Name: Kristin Kramer MRN: [id number] DOB:1941/01/15, 81 y.o., female Today's Date: 12/02/2021   PT End of Session - 12/02/21 0847     Visit Number 8    Number of Visits 12    Date for PT Re-Evaluation 12/17/21    Authorization Type IE: 09/24/21    PT Start Time 0845    PT Stop Time 0925    PT Time Calculation (min) 40 min    Activity Tolerance Patient tolerated treatment well             Past Medical History:  Diagnosis Date   Abdominal pain 12/2012   [facility] ER - mesenteric adenitis 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 left axis deviation;  b. 12/2011 Echo: EF 55-60%, no rwma, pasp 70mHg.   Abnormal EKG    Chronic LAFB, incomplete RBBB Has been previously cleared by cardiology for surgery (Rockey Situ2013, 2015)   Basal cell carcinoma 02/2021   R nose tip ([place])   Cervical neck pain with evidence of disc disease 08/05/2016   Last Assessment & Plan:  Ongoing cervical neck pain after fall. NSAIDs effective for relief. She has started seeing chiropractor. Reviewed ER workup including CT showing evidence of bulging disc and disc space loss.   Chronic nasal congestion 04/19/2017   Ongoing since sinus surgery 05/2016  S/p ENT and allergist eval.    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 Recovering well from this.   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   S/p bilateral knee replacement  Formatting of this note might be different from the original. 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 Whitworth/[place]/Stinehelfer), R tibia x4+, L tibia x2, L dorsal hand   TMJ tenderness, right 02/08/2018   Tubular adenoma of colon 08/2014   Varicose veins of both lower extremities with pain 05/11/2020   Vitamin D deficiency    Past Surgical History:  Procedure Laterality Date   AUGMENTATION MAMMAPLASTY     BREAST ENHANCEMENT SURGERY     BUNIONECTOMY  2013   R and L foot   CATARACT EXTRACTION Bilateral 2013   R, pending L   COLONOSCOPY  2007   no records received   COLONOSCOPY  08/2014    tubular adenoma, rpt 5 yrs Fuller Plan)   COLONOSCOPY  06/2020   4 polyps removed, TA, no rpt recommended Fuller Plan)   CONVERSION TO TOTAL HIP Right 12/07/2017   Procedure: CONVERSION TO TOTAL HIP ( REMOVING SMITH AND NEPHEW NAIL);  Surgeon: Hessie Knows, MD;  Location: [facility] ORS;  Service: Orthopedics;  Laterality: Right;   COSMETIC SURGERY  2002   face lift   dexa  06/2008   T -1.4 spine and hip   ETHMOIDECTOMY Bilateral 05/19/2016   Procedure: ETHMOIDECTOMY;  Surgeon: Carloyn Manner, MD;  Location: [facility] OR[facility];  Service: ENT;  Laterality: Bilateral;   EYE SURGERY     bilateral cataract surgery   FRONTAL SINUS EXPLORATION Bilateral 05/19/2016   Procedure: FRONTAL SINUS EXPLORATION;  Surgeon: Carloyn Manner, MD;  Location: [facility] ORS;  Service: ENT;  Laterality: Bilateral;   INTRAMEDULLARY (IM) NAIL INTERTROCHANTERIC Right 07/08/2017   Procedure: RIGHT INTERTROCHANTRIC IM NAIL;  Surgeon: Leandrew Koyanagi, MD;  Location: [facility];  Service: Orthopedics;  Laterality: Right;   MAXILLARY ANTROSTOMY Bilateral 05/19/2016   Procedure: MAXILLARY ANTROSTOMY;  Surgeon: Carloyn Manner, MD;  Location: [facility];  Service: ENT;  Laterality: Bilateral;   MOHS SURGERY  2011   R leg   PARTIAL HYSTERECTOMY  1980   fibroids, ovaries remained   SINUS ENDO W/FUSION N/A 05/19/2016   Procedure: ENDOSCOPIC SINUS SURGERY WITH NAVIGATION;  Surgeon: Carloyn Manner, MD;  Location: [facility];  Service: ENT;  Laterality: N/A;   SPHENOIDECTOMY Bilateral 05/19/2016   Procedure: Coralee Pesa;  Surgeon: Carloyn Manner, MD;  Location: [facility];  Service: ENT;  Laterality: Bilateral;   SQUAMOUS CELL CARCINOMA EXCISION Right 07/13/2016   Dr. Rolm Bookbinder, [facility]   TONSILLECTOMY AND [place] Right 08/07/2019   Procedure: Right hip acetabular versus total hip arthroplasty revision-posterior;  Surgeon: Gaynelle Arabian, MD;  Location: [facility];  Service: Orthopedics;  Laterality: Right;   148mn   TOTAL KNEE ARTHROPLASTY  01/02/2012   Right - Surgeon: RLorn Junes MD   TOTAL KNEE ARTHROPLASTY Left 03/18/2013   Surgeon: RLorn Junes MD   Patient Active Problem List   Diagnosis Date Noted   Urinary, incontinence, stress female 09/10/2021   [facility] 09/10/2021   Left rib fracture 02/08/2021   Pre-diabetes 12/23/2020   Mild cognitive impairment 12/23/2020   Generalized anxiety disorder    Varicose veins of both lower extremities with pain 05/11/2020   Chronic rhinitis 03/10/2020   Elevated blood pressure reading without diagnosis of hypertension 03/10/2020   Health maintenance examination 09/05/2019   GERD (gastroesophageal reflux disease) 09/05/2019   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   Situational stress 07/10/2019   Osteopenia 09/03/2018   HLD (hyperlipidemia) 09/03/2018   Vitamin D deficiency 09/03/2018   Leg cramping 08/15/2018  Hypothyroidism 08/15/2018   TMJ tenderness, right 02/08/2018   Lactose intolerance 02/08/2018   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   Chronic nasal congestion 04/19/2017   Exertional dyspnea 04/19/2017   Cervical neck pain with evidence of disc disease 08/05/2016   Advanced care planning/counseling discussion 07/31/2014   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   Osteoarthritis of knee 03/18/2013   Postoperative nausea and vomiting    Transfusion of blood product refused for religious reason    Medicare annual wellness visit, subsequent 01/14/2013   Elevated alkaline phosphatase level 01/03/2013   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 No  OCCUPATION/SOCIAL ACTIVITIES: swimming, walking, dinner, playing cards  PLOF: Independent  PERTINENT HISTORY/CHART REVIEW: From 02/08/21 note with [name]: Diagnosed with mild cognitive impairment of unclear etiology, related to anxiety > depression. Not consistent of alzheimers or other neurodegenerative illness at this time.   12/30/20 note with [name] - Neurology - Pt presented for mild cognitive impairment. Discussed results.   CHIEF CONCERN: Pt has constant urinary leakage with coughing/laughing/sneezing/jumping. Pt has not noticed urinary leakage with walking, but her pantyliner is soiled enough where she needs to change it. Pt has had leakage more than 3 years ago but it is now more constant. Pt has had instances of bowel leakage, but not regularly. Pt has had to change undergarments because she feels it is just gas but it is not. Pt does notice when she goes to urinate and then wipes there is a bowel stain although she did not have a BM.   Pain type: aching Pain description: constant   Aggravating factors: prolonged sitting, household chores  Relieving factors: dry needling. Chiropractor, heat  LIVING ENVIRONMENT: Lives with: lives alone, has a son in [place]  Lives in: House/apartment   PATIENT GOALS: Increase strength in the deep core, I would like to not leak and use of pantyliners or smelling like urine     UROLOGICAL HISTORY Fluid intake: flavored water (1-2/day), 2 cups of coffee   Pain with urination: No Fully empty bladder: No Stream: stop and go  Urgency: No Frequency: 5-6x Nocturia: 0x Leakage: Coughing, Sneezing, Laughing, Exercise, and Lifting Pads: Yes Type:  pantyliners Amount: 3x/day, soiled, wearing through the night  Bladder control (0-10): 5/10   GASTROINTESTINAL HISTORY Leakage: Yes but very rarely  Pads: No    SEXUAL HISTORY/FUNCTION Pt has no concerns   OBSTETRICAL HISTORY Vaginal deliveries: G3P3 Tearing: Yes: stiches with first child    GYNECOLOGICAL HISTORY Hysterectomy: yes, partial hysterectomy  Pelvic Organ Prolapse: None Heaviness/pressure: no   SUBJECTIVE:  Pt has not had an episode of dizziness or BP problems since last session. Pt did not feel well the past few days after getting the flu/COVID booster on Tuesday.             PAIN:  Are you having pain? No NPRS: 0/10  OBJECTIVE:   COGNITION: Overall cognitive status: History of cognitive impairments - at baseline -see above in pertinent history/chart review    POSTURE:  Iliac crest height: L iliac crest higher   Pelvic obliquity: L posteriorly rotated   SENSATION:  Light touch: intact, L2-S2 dermatomes     RANGE OF MOTION:    (Norm range in degrees)  LEFT 10/07/21 RIGHT 10/07/21  Lumbar forward flexion (65):  WNL    Lumbar extension (30): WNL    Lumbar lateral flexion (25):  WNL WNL  Thoracic and Lumbar rotation (30 degrees):    WNL WNL  Hip Flexion (0-125):   Restricted* WNL*  Hip IR (0-45):  WNL WNL  Hip ER (0-45):  WNL WNL  Hip Adduction:      Hip Abduction (0-40):  Restricted* WNL  Hip extension (0-15):     (*= pain, Blank rows = not tested)   STRENGTH: MMT    RLE 10/07/21 LLE 10/07/21  Hip Flexion 4 4  Hip Extension 4 4  Hip Abduction     Hip Adduction     Hip ER  5 4  Hip IR  5 4*  Knee Extension 5 5  Knee Flexion 4 4  Dorsiflexion     Plantarflexion (seated) 5 5  (*= pain, Blank rows = not tested)   SPECIAL TESTS:   FABER (SN 81): negative B FADIR (SN  94): negative B  PALPATION:  Abdominal:  Diastasis:  2 fingers below umbilicus, Valsalva maneuver noted Rib flare: present B  EXTERNAL PELVIC EXAM: Patient educated on the purpose of the pelvic exam and articulated understanding; patient consented to the exam verbally.  Breath coordination: inconsistent  Voluntary Contraction: present, 2/5 MMT but felt spasm of PFM Relaxation: delayed Perineal movement with sustained IAP increase ("bear down"): minimal descent, Valsalva maneuver noted Perineal movement with rapid IAP increase ("cough"): no change (0= no contraction, 1= flicker, 2= weak squeeze, 3= fair squeeze with lift, 4= good squeeze and lift against resistance, 5= strong squeeze against strong resistance)    TODAY'S TREATMENT  Neuromuscular Re-education: Discussion on toileting posture and the importance of relaxing the PFM with diaphragmatic breathing as Pt reports "pushing" out urine and rushing toileting. Pt verbalized understanding.   Supine hooklying diaphragmatic breathing with VCs and TCs for downregulation of the nervous system and improved management of IAP  Supine hip 90/90 holds with diaphragmatic breathing for improved IAP management  Supine heel taps, B x6, with coordinated breathing for improved IAP management   Seated diaphragmatic breathing with VCs and TCs for downregulation of the nervous system and improved management of IAP  Seated TrA contraction with coordinated breathing for improved IAP management   POSTURE:  Iliac crest height: L iliac crest higher   Pelvic obliquity: L posteriorly rotated   Patient response to interventions: Pt had some soreness in the R hip with supine heel taps    Patient Education:  Patient provided with HEP including: supine TrA activation with heel drop and 90/90 hold, seated TrA activation. Patient educated throughout session on appropriate technique and form using multi-modal cueing, HEP, and activity modification. Patient  will continue to benefit from further education in order to maximize compliance and understanding for long-term therapeutic gains.    ASSESSMENT:  Clinical Impression: Patient with excellent motivation to participate in today's session. Pt continues to demonstrate deficits in IAP management, PFM coordination, PFM strength, posture, pain, and LE strength/ROM. Pt reports improved symptoms of dizziness since last session. Pt also reports  improved urinary leakage but discussion on toileting and how to relax PFM in order to avoid urinary leakage after toileting. Pt verbalized understanding. Pt required moderate VCs and TCs for progression of TrA activation in various positions to decrease bodily compensations and proper techniques. Upon brief postural assessment, Pt with improved iliac crest alignment compared to IE (increased L iliac crest height). Pt responded positively to active and educational interventions. Patient will continue to benefit from skilled therapeutic intervention to address deficits in IAP management, PFM coordination, PFM strength, PFM extensibility, posture, pain, and LE strength/ROM in order to increase PLOF and improve overall QOL.    Objective Impairments: decreased activity tolerance, decreased coordination, decreased endurance, decreased strength, improper body mechanics, postural dysfunction, and pain.   Activity Limitations: lifting, bending, squatting, continence, toileting, and locomotion level  Personal Factors: Age, Past/current experiences, Time since onset of injury/illness/exacerbation, and 3+ comorbidities: mild cognitive impairment, HTN, osteopenia  are also affecting patient's functional outcome.   Rehab Potential: Good  Clinical Decision Making: Evolving/moderate complexity  Evaluation Complexity: Moderate   GOALS: Goals reviewed with patient? Yes  SHORT TERM GOALS: Target date: 11/05/21  Patient will demonstrate independent and coordinated diaphragmatic  breathing in supine with a 1:2 breathing pattern for improved down-regulation of the nervous system and improved management of intra-abdominal pressures in order to increase function at home and in the community. Baseline: (8/3): Valsalva maneuver throughout session and contracted rectus/obliques with inhalation; (9/1): able to demonstrate diaphragmatic breathing with no cueing  Goal status: MET    LONG TERM GOALS: Target date: 12/17/21  Patient will decrease worst lumbar spine pain as reported on NPRS by at least 2 points to demonstrate clinically significant reduction in pain in order to restore/improve function and overall QOL. Baseline: 10/10  Goal status: INITIAL  2.  Patient will report being able to return to activities including, but not limited to: walking, reading, household chores, and prolonged physical activity without pain or limitation to indicate improved core strengthening and return to prior level of participation at home and in the community. Baseline: moderate-quite a bit of difficulty performing household activities, sitting for long periods of time Goal status: INITIAL  3.  Patient will report decreased reliance on protective undergarments as indicated by a 24 hour period to demonstrate improved bladder control and allow for increased participation in activities outside of the home. Baseline: pantyliners/3x per day  Goal status: INITIAL  4.  Patient will report less than 5 incidents of stress urinary incontinence over the course of 3 weeks while coughing/sneezing/laughing/prolonged activity in order to demonstrate improved PFM coordination, strength, and function for improved overall QOL. Baseline: leakage occurs every time with all the above Goal status: INITIAL  5.  Patient will report confidence in ability to control bladder > 7/10 in order to demonstrate improved function and ability to participate more fully in activities at home and in the community. Baseline:  5/10 Goal status: INITIAL    PLAN: PT Frequency: 1x/week  PT Duration: 12 weeks  Planned Interventions: Therapeutic exercises, Therapeutic activity, Neuromuscular re-education, Balance training, Gait training, Patient/Family education, Self Care, Joint mobilization, Cryotherapy, Moist heat, scar mobilization, Taping, and Manual therapy  Plan For Next Session: continue deep core progression, seated/standing pallof/wall squats   Lamari Youngers, PT, DPT  12/02/2021, 8:48 AM

## 2021-12-04 DIAGNOSIS — I10 Essential (primary) hypertension: Secondary | ICD-10-CM | POA: Diagnosis not present

## 2021-12-04 DIAGNOSIS — E785 Hyperlipidemia, unspecified: Secondary | ICD-10-CM

## 2021-12-04 DIAGNOSIS — M810 Age-related osteoporosis without current pathological fracture: Secondary | ICD-10-CM | POA: Diagnosis not present

## 2021-12-04 DIAGNOSIS — F32A Depression, unspecified: Secondary | ICD-10-CM

## 2021-12-06 ENCOUNTER — Telehealth: Payer: Self-pay

## 2021-12-06 NOTE — Chronic Care Management (AMB) (Signed)
Chronic Care Management Pharmacy Assistant   Name: Chondra Boyde  MRN: [id number] DOB: [birth year]  Reason for Encounter: General Adherence    Recent office visits:  10/181/23-Richard Letvak,MD(fam med)-URI,Will try amoxil---diarrhea with augmentin Switch if doesn't respond (augmentin or doxy)  Recent consult visits:  None since last CCM contact  Hospital visits:  None in previous 6 months  Medications: Outpatient Encounter Medications as of 12/06/2021  Medication Sig   aspirin EC 81 MG tablet Take 81 mg by mouth daily. Swallow whole.   busPIRone (BUSPAR) 5 MG tablet Take 1 tablet (5 mg total) by mouth 2 (two) times daily as needed (anxiety).   calcium carbonate (OSCAL) 1500 (600 Ca) MG TABS tablet Take 600 mg of elemental calcium by mouth daily with breakfast.   Cholecalciferol (VITAMIN D-3) 5000 units TABS Take 5,000 Units by mouth daily.    Coenzyme Q10 (COQ10 PO) Take 1 capsule by mouth daily.    ezetimibe (ZETIA) 10 MG tablet Take 1 tablet (10 mg total) by mouth daily.   famotidine (PEPCID) 20 MG tablet Take 20 mg by mouth daily as needed for heartburn or indigestion.   fexofenadine (ALLEGRA ALLERGY) 180 MG tablet Take 1 tablet (180 mg total) by mouth daily.   hydrochlorothiazide (MICROZIDE) 12.5 MG capsule Take 1 capsule (12.5 mg total) by mouth daily.   levothyroxine (SYNTHROID) 50 MCG tablet Take 1 tablet (50 mcg total) by mouth daily before breakfast.   losartan (COZAAR) 25 MG tablet Take 1 tablet (25 mg total) by mouth daily.   Magnesium 250 MG TABS Take 1 tablet (250 mg total) by mouth daily.   METAMUCIL FIBER PO Take by mouth daily.   niacinamide 500 MG tablet Take 1 tablet (500 mg total) by mouth daily.   Potassium 99 MG TABS Take 1 tablet (99 mg total) by mouth daily.   prednisoLONE acetate (PRED FORTE) 1 % ophthalmic suspension    Probiotic Product (PROBIOTIC PO) Take 1 capsule by mouth daily.    sertraline (ZOLOFT) 25 MG tablet Take 1.5 tablets (37.5 mg  total) by mouth at bedtime.   TURMERIC PO Take 900 mg by mouth daily.    No facility-administered encounter medications on file as of 12/06/2021.    Contacted [name] on 12/24/21 for general disease state and medication adherence call.   Patient is not more than 5 days past due for refill on the following medications per chart history:  [facility] Medications: Medication Name/mg Last Fill Days Supply Losartan '25mg'$   11/25/21 90    What concerns do you have about your medications?  The patient denies side effects with their medications.   How often do you forget or accidentally miss a dose? Never  Do you use a pillbox? Yes  Are you having any problems getting your medications from your pharmacy? No  Has the cost of your medications been a concern? No   Since last visit with CPP, the following interventions have been made. Patient had a trial of the Omeprazole with good response.   The patient has not had an ED visit since last contact.   The patient reports the following problems with their health. Current URI, is now on amoxicillin and improving   Patient denies concerns or questions for [name], PharmD at this time.   Counseled patient on:  [place] job taking medications, Importance of taking medication daily without missed doses, Benefits of adherence packaging or a pillbox, and Access to CCM team for any cost, medication  or pharmacy concerns.   Care Gaps: Annual wellness visit in last year? Yes Most Recent BP reading:  Summary of recommendations from last CCM Pharmacy visit (Date:11/12/21) Summary: CCM F/U visit -Reviewed medications; pt affirms compliance as prescribed -Pt reports reflux is worsening lately, she is just using famotidine 20 mg PRN. She stopped omeprazole earlier this year when reflux was controlled. Discussed trigger food avoidance and not laying down within 1 hr of eating   Recommendations/Changes made from today's visit: -Advised trial  of OTC omeprazole 20 mg daily in AM x 2 weeks;  pt will contact office if she wants to continue omeprazole  Upcoming appointments: No appointments scheduled within the next 30 days.  Several rehab visits scheduled for pelvic condition.  [name], CPP notified  Avel Sensor, [place]  [phone]

## 2021-12-09 ENCOUNTER — Ambulatory Visit: Payer: Medicare Other | Attending: Family Medicine

## 2021-12-09 DIAGNOSIS — M6289 Other specified disorders of muscle: Secondary | ICD-10-CM | POA: Insufficient documentation

## 2021-12-09 DIAGNOSIS — R278 Other lack of coordination: Secondary | ICD-10-CM | POA: Diagnosis not present

## 2021-12-09 DIAGNOSIS — M6281 Muscle weakness (generalized): Secondary | ICD-10-CM | POA: Diagnosis not present

## 2021-12-09 NOTE — Therapy (Signed)
OUTPATIENT PHYSICAL THERAPY FEMALE PELVIC PROGRESS NOTE/RE-CERT FROM REPORTING PERIOD 09/24/21-12/09/21   Patient Name: Kristin Kramer MRN: [id number] DOB:[birth year], 81 y.o., female Today's Date: 12/09/2021   PT End of Session - 12/09/21 0844     Visit Number 10    Number of Visits 20    Date for PT Re-Evaluation 02/24/22    Authorization Type IE: 09/24/21; PN/RC: 12/09/21    PT Start Time 0845    PT Stop Time 0925    PT Time Calculation (min) 40 min    Activity Tolerance Patient tolerated treatment well             Past Medical History:  Diagnosis Date   Abdominal pain 12/2012   [facility] ER - mesenteric adenitis 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 left axis deviation;  b. 12/2011 Echo: EF 55-60%, no rwma, pasp 69mHg.   Abnormal EKG    Chronic LAFB, incomplete RBBB Has been previously cleared by cardiology for surgery ([place] Situ2013, 2015)   Basal cell carcinoma 02/2021   R nose tip ([place])   Cervical neck pain with evidence of disc disease 08/05/2016   Last Assessment & Plan:  Ongoing cervical neck pain after fall. NSAIDs effective for relief. She has started seeing chiropractor. Reviewed ER workup including CT showing evidence of bulging disc and disc space loss.   Chronic nasal congestion 04/19/2017   Ongoing since sinus surgery 05/2016  S/p ENT and allergist eval.    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 Recovering well from this.   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   S/p bilateral knee replacement  Formatting of this note might be different from the original. 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    Seasonal allergies    cats, dust, mold, roaches   Squamous cell  skin cancer 2015 multiple, 2016, 2017, 2018, 2019, 2020, 2021, 2022   R anterior leg (Dr. Whitworth/[place]/Stinehelfer), R tibia x4+, L tibia x2, L dorsal hand   TMJ tenderness, right 02/08/2018   Tubular adenoma of colon 08/2014   Varicose veins of both lower extremities with pain 05/11/2020   Vitamin D deficiency    Past Surgical History:  Procedure Laterality Date   AUGMENTATION MAMMAPLASTY     BREAST ENHANCEMENT SURGERY     BUNIONECTOMY  2013   R and L foot   CATARACT EXTRACTION Bilateral 2013   R, pending L    COLONOSCOPY  2007   no records received   COLONOSCOPY  08/2014   tubular adenoma, rpt 5 yrs Fuller Plan)   COLONOSCOPY  06/2020   4 polyps removed, TA, no rpt recommended Fuller Plan)   CONVERSION TO TOTAL HIP Right 12/07/2017   Procedure: CONVERSION TO TOTAL HIP ( REMOVING SMITH AND NEPHEW NAIL);  Surgeon: Hessie Knows, MD;  Location: [facility];  Service: Orthopedics;  Laterality: Right;   COSMETIC SURGERY  2002   face lift   dexa  06/2008   T -1.4 spine and hip   ETHMOIDECTOMY Bilateral 05/19/2016   Procedure: ETHMOIDECTOMY;  Surgeon: Carloyn Manner, MD;  Location: [facility];  Service: ENT;  Laterality: Bilateral;   EYE SURGERY     bilateral cataract surgery   FRONTAL SINUS EXPLORATION Bilateral 05/19/2016   Procedure: FRONTAL SINUS EXPLORATION;  Surgeon: Carloyn Manner, MD;  Location: [facility];  Service: ENT;  Laterality: Bilateral;   INTRAMEDULLARY (IM) NAIL INTERTROCHANTERIC Right 07/08/2017   Procedure: RIGHT INTERTROCHANTRIC IM NAIL;  Surgeon: Leandrew Koyanagi, MD;  Location: [place];  Service: Orthopedics;  Laterality: Right;   MAXILLARY ANTROSTOMY Bilateral 05/19/2016   Procedure: MAXILLARY ANTROSTOMY;  Surgeon: Carloyn Manner, MD;  Location: [facility];  Service: ENT;  Laterality: Bilateral;   MOHS SURGERY  2011   R leg   PARTIAL HYSTERECTOMY  1980   fibroids, ovaries remained   SINUS ENDO W/FUSION N/A 05/19/2016   Procedure: ENDOSCOPIC SINUS SURGERY WITH NAVIGATION;  Surgeon: Carloyn Manner, MD;  Location: [facility];  Service: ENT;  Laterality: N/A;   SPHENOIDECTOMY Bilateral 05/19/2016   Procedure: Coralee Pesa;  Surgeon: Carloyn Manner, MD;  Location: [facility];  Service: ENT;  Laterality: Bilateral;   SQUAMOUS CELL CARCINOMA EXCISION Right 07/13/2016   Dr. Rolm Bookbinder, [facility]   TONSILLECTOMY AND Bonnie Right 08/07/2019   Procedure: Right hip acetabular versus total hip arthroplasty revision-posterior;  Surgeon: Gaynelle Arabian,  MD;  Location: [facility];  Service: Orthopedics;  Laterality: Right;  175mn   TOTAL KNEE ARTHROPLASTY  01/02/2012   Right - Surgeon: RLorn Junes MD   TOTAL KNEE ARTHROPLASTY Left 03/18/2013   Surgeon: RLorn Junes MD   Patient Active Problem List   Diagnosis Date Noted   Urinary, incontinence, stress female 09/10/2021   Osteoarthritis of carpometacarpal ([facility] joint of right thumb 09/10/2021   Left rib fracture 02/08/2021   Pre-diabetes 12/23/2020   Mild cognitive impairment 12/23/2020   Generalized anxiety disorder    Varicose veins of both lower extremities with pain 05/11/2020   Chronic rhinitis 03/10/2020   Elevated blood pressure reading without diagnosis of hypertension 03/10/2020   Health maintenance examination 09/05/2019   GERD (gastroesophageal reflux disease) 09/05/2019   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   Situational stress 07/10/2019   Osteopenia 09/03/2018   HLD (hyperlipidemia) 09/03/2018   Vitamin D deficiency  09/03/2018   Leg cramping 08/15/2018   Hypothyroidism 08/15/2018   TMJ tenderness, right 02/08/2018   Lactose intolerance 02/08/2018   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   Chronic nasal congestion 04/19/2017   Exertional dyspnea 04/19/2017   Cervical neck pain with evidence of disc disease 08/05/2016   Advanced care planning/counseling discussion 07/31/2014   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   Osteoarthritis of knee 03/18/2013   Postoperative nausea and vomiting    Transfusion of blood product refused for religious reason    Medicare annual wellness visit, subsequent 01/14/2013   Elevated alkaline phosphatase level 01/03/2013   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 No  OCCUPATION/SOCIAL ACTIVITIES: swimming, walking, dinner, playing cards  PLOF: Independent  PERTINENT HISTORY/CHART REVIEW: From 02/08/21 note with [name]: Diagnosed with mild cognitive impairment of unclear etiology, related to anxiety > depression. Not consistent of alzheimers or other neurodegenerative illness at this time.   12/30/20 note with [name] - Neurology - Pt presented for mild cognitive impairment. Discussed results.   CHIEF CONCERN: Pt has constant urinary leakage with coughing/laughing/sneezing/jumping. Pt has not noticed urinary leakage with walking, but her pantyliner is soiled enough where she needs to change it. Pt has had leakage more than 3 years ago but it is now more constant. Pt has had instances of bowel leakage, but not regularly. Pt has had to change undergarments because she feels it is just gas but it is not. Pt does notice when she goes to urinate and then wipes there is a bowel stain although she did not have a BM.   Pain type: aching Pain description: constant   Aggravating factors: prolonged sitting, household chores  Relieving factors: dry needling. Chiropractor, heat  LIVING ENVIRONMENT: Lives with: lives alone, has a son in [place]  Lives in: House/apartment   PATIENT GOALS: Increase strength in the deep core, I would like to not leak and use of pantyliners or smelling like urine     UROLOGICAL HISTORY Fluid intake: flavored water (1-2/day), 2 cups of coffee   Pain with urination: No Fully empty bladder: No Stream: stop and go  Urgency: No Frequency: 5-6x Nocturia: 0x Leakage: Coughing, Sneezing,  Laughing, Exercise, and Lifting Pads: Yes Type: pantyliners Amount: 3x/day, soiled, wearing through the night  Bladder control (0-10): 5/10   GASTROINTESTINAL HISTORY Leakage: Yes but very rarely  Pads: No    SEXUAL HISTORY/FUNCTION Pt has no concerns   OBSTETRICAL HISTORY Vaginal deliveries: G3P3 Tearing: Yes: stiches with first child    GYNECOLOGICAL HISTORY Hysterectomy: yes, partial hysterectomy  Pelvic Organ Prolapse: None Heaviness/pressure: no   SUBJECTIVE:  Pt feels a little "jittery" this morning and took BP at home 133/75 mmHg. Pt having no other symptoms.             PAIN:  Are you having pain? No NPRS: 0/10  OBJECTIVE:   COGNITION: Overall cognitive status: History of cognitive impairments - at baseline -see above in pertinent history/chart review    POSTURE:  Iliac crest height: L iliac crest higher   Pelvic obliquity: L posteriorly rotated   SENSATION:  Light touch: intact, L2-S2 dermatomes     RANGE OF MOTION:    (Norm range in degrees)  LEFT 10/07/21 RIGHT 10/07/21  Lumbar forward flexion (65):  WNL    Lumbar extension (30): WNL    Lumbar lateral flexion (25):  WNL WNL  Thoracic and Lumbar rotation (30 degrees):    WNL WNL  Hip Flexion (0-125):   Restricted* WNL*  Hip IR (0-45):  WNL WNL  Hip ER (0-45):  WNL WNL  Hip Adduction:      Hip Abduction (0-40):  Restricted* WNL  Hip extension (0-15):     (*= pain, Blank rows = not tested)   STRENGTH: MMT    RLE 10/07/21 LLE 10/07/21  Hip Flexion 4 4  Hip Extension 4 4  Hip Abduction     Hip Adduction     Hip ER  5 4  Hip IR  5 4*  Knee Extension 5 5  Knee Flexion 4 4  Dorsiflexion     Plantarflexion (seated) 5 5  (*= pain, Blank rows = not tested)   SPECIAL TESTS:   FABER (SN 81): negative B FADIR (SN 94):  negative B  PALPATION:  Abdominal:  Diastasis:  2 fingers below umbilicus, Valsalva maneuver noted Rib flare: present B  EXTERNAL PELVIC EXAM: Patient educated on the purpose of the pelvic exam and articulated understanding; patient consented to the exam verbally.  Breath coordination: inconsistent  Voluntary Contraction: present, 2/5 MMT but felt spasm of PFM Relaxation: delayed Perineal movement with sustained IAP increase ("bear down"): minimal descent, Valsalva maneuver noted Perineal movement with rapid IAP increase ("cough"): no change (0= no contraction, 1= flicker, 2= weak squeeze, 3= fair squeeze with lift, 4= good squeeze and lift against resistance, 5= strong squeeze against strong resistance)    TODAY'S TREATMENT  Neuromuscular Re-education: BP taken in seated -  116/76 mmHg, 75 BPM   Review of LTGs and STGs below for reassessment Pt has met 3/6 goals  Discussion on significant progress made over the past few months and praised for improvement   FOTO Reassessment  Urinary Problem - 57 (9/1 - 46) and goal was 57  Bowel Leakage - 56 (Goal - 55)  Lumbar Spine - 61 (Goal - 51)  Discussion on heavy activities that Pt responded limited a little - gardening, yardwork, moving furniture   Discussion on strategies to think about when gardening (no long periods of bending over) or investing in a stool or kneeling on a supportive surface (folded blanket or old pillow) to decrease long periods of bending and twisting. Pt verbalized understanding.    Patient response to interventions: Pt had some soreness in the R hip with supine heel taps    Patient Education:  Patient provided with HEP including: supine TrA activation with heel drop and 90/90 hold, seated TrA activation. Patient educated throughout session on appropriate technique and form using multi-modal cueing, HEP, and activity modification. Patient will continue to benefit from further education in order to maximize  compliance and understanding for long-term therapeutic gains.    ASSESSMENT:  Clinical Impression: Patient with excellent motivation to participate in today's progress note/reassessment. Pt has demonstrated significant improvement in chief concern of urinary leakage since IE on 09/24/21. Pt initially  had bowel leakage but that has completely resolved according to subjective hx and FOTO scores of 56 (IE : 41). Pt has met 3/6 goals listed below as well as the target score for FOTO Urinary Problem (57) and Lumbar Spine (61). Pt continues to wear incontinence pantyliner but reports on average requiring only 1x/day change (IE: 3x/day) and only 3 incidents of SUI over the course of 3 weeks with sneezing/coughing. Pt's confidence in and control of her bladder has improved significantly (8/10) from IE (5/10). Although Pt has demonstrated improvement, Pt will continue to benefit from skilled therapeutic intervention to address remaining deficits of urinary leakage, proper body mechanics/lifting mechanics to decrease LBP, and continued improvement in bladder control/confidence. Deficits remaining include IAP management, PFM strength, pain, and LE strength/ROM which will be addressed in continued visits in order to increase PLOF and improve overall QOL.    Objective Impairments: decreased activity tolerance, decreased coordination, decreased endurance, decreased strength, improper body mechanics, postural dysfunction, and pain.   Activity Limitations: lifting, bending, squatting, continence, toileting, and locomotion level  Personal Factors: Age, Past/current experiences, Time since onset of injury/illness/exacerbation, and 3+ comorbidities: mild cognitive impairment, HTN, osteopenia  are also affecting patient's functional outcome.   Rehab Potential: Good  Clinical Decision Making: Evolving/moderate complexity  Evaluation Complexity: Moderate   GOALS: Goals reviewed with patient? Yes  SHORT TERM  GOALS: Target date: 11/05/21  Patient will demonstrate independent and coordinated diaphragmatic breathing in supine with a 1:2 breathing pattern for improved down-regulation of the nervous system and improved management of intra-abdominal pressures in order to increase function at home and in the community. Baseline: (8/3): Valsalva maneuver throughout session and contracted rectus/obliques with inhalation; (9/1): able to demonstrate diaphragmatic breathing with no cueing  Goal status: MET    LONG TERM GOALS: Target date: 02/24/22  Patient will decrease worst lumbar spine pain as reported on NPRS by at least 2 points to demonstrate clinically significant reduction in pain in order to restore/improve function and overall QOL. Baseline: 10/10, (10/5): 4/10 (8/10 when out in the yard or during physical activity) Goal status: MET  2.  Patient will report being able to return to activities including, but not limited to: walking, reading, household chores, and prolonged physical activity without pain or limitation to indicate improved core strengthening and return to prior level of participation at home and in the community. Baseline: moderate-quite a bit of difficulty performing household activities, sitting for long periods of time; (10/5): Limited, a little (FOTO response) more vigorous activities and some household activities  Goal status: IN PROGRESS  3.  Patient will report decreased reliance on protective undergarments as indicated by a 24 hour period to demonstrate improved bladder control and allow for increased participation in activities outside of the home. Baseline: pantyliners/3x per day; (10/5): pantyliner, 1x/day Goal status: IN PROGRESS   4.  Patient will report less than 5 incidents of stress urinary incontinence over the course of 3 weeks while coughing/sneezing/laughing/prolonged activity in order to demonstrate improved PFM coordination, strength, and function for improved overall  QOL. Baseline: leakage occurs every time with all the above; (10/5): 3 incidents over the past 3 weeks Goal status: IN PROGRESS  5.  Patient will report confidence in ability to control bladder > 7/10 in order to demonstrate improved function and ability to participate more fully in activities at home and in the community. Baseline: 5/10; (10/5): 8/10 Goal status: MET    PLAN: PT Frequency: 1x/week  PT Duration: 12 weeks  Planned  Interventions: Therapeutic exercises, Therapeutic activity, Neuromuscular re-education, Balance training, Gait training, Patient/Family education, Self Care, Joint mobilization, Cryotherapy, Moist heat, scar mobilization, Taping, and Manual therapy  Plan For Next Session: continue deep core progression, seated/standing pallof/wall squats   Khamila Bassinger, PT, DPT  12/09/2021, 9:27 AM

## 2021-12-16 ENCOUNTER — Ambulatory Visit: Payer: Medicare Other

## 2021-12-22 ENCOUNTER — Ambulatory Visit: Payer: Medicare Other

## 2021-12-22 ENCOUNTER — Ambulatory Visit (INDEPENDENT_AMBULATORY_CARE_PROVIDER_SITE_OTHER): Payer: Medicare Other | Admitting: Internal Medicine

## 2021-12-22 ENCOUNTER — Encounter: Payer: Self-pay | Admitting: Internal Medicine

## 2021-12-22 DIAGNOSIS — J0140 Acute pansinusitis, unspecified: Secondary | ICD-10-CM | POA: Diagnosis not present

## 2021-12-22 MED ORDER — AMOXICILLIN 500 MG PO TABS: 1000.0000 mg | ORAL_TABLET | Freq: Two times a day (BID) | ORAL | 0 refills | Status: AC

## 2021-12-22 NOTE — Progress Notes (Signed)
Subjective:    Patient ID: Kristin Kramer, female    DOB: 1940/09/20, 81 y.o.   MRN: [id number]  HPI Here due to persistent respiratory symptoms  Symptoms started 12 days ago--in her throat Has a lot of "stuff" in there Felt like it went down into her chest Now it feels in her head Terrible cough  Has tried mucinex--helps bring it up Bringing up green mucus---also from her nose  Low grade fever--- 100.4 (last night) No chills or sweats Slight SOB--feels slightly labored Some left ear pain yesterday only  Taking tylenol--- and day/night cold meds (not clearly helpful)  Current Outpatient Medications on File Prior to Visit  Medication Sig Dispense Refill   aspirin EC 81 MG tablet Take 81 mg by mouth daily. Swallow whole.     busPIRone (BUSPAR) 5 MG tablet Take 1 tablet (5 mg total) by mouth 2 (two) times daily as needed (anxiety). 180 tablet 3   calcium carbonate (OSCAL) 1500 (600 Ca) MG TABS tablet Take 600 mg of elemental calcium by mouth daily with breakfast.     Cholecalciferol (VITAMIN D-3) 5000 units TABS Take 5,000 Units by mouth daily.      Coenzyme Q10 (COQ10 PO) Take 1 capsule by mouth daily.      ezetimibe (ZETIA) 10 MG tablet Take 1 tablet (10 mg total) by mouth daily. 90 tablet 3   famotidine (PEPCID) 20 MG tablet Take 20 mg by mouth daily as needed for heartburn or indigestion.     fexofenadine (ALLEGRA ALLERGY) 180 MG tablet Take 1 tablet (180 mg total) by mouth daily.     levothyroxine (SYNTHROID) 50 MCG tablet Take 1 tablet (50 mcg total) by mouth daily before breakfast. 90 tablet 3   Magnesium 250 MG TABS Take 1 tablet (250 mg total) by mouth daily.  0   METAMUCIL FIBER PO Take by mouth daily.     niacinamide 500 MG tablet Take 1 tablet (500 mg total) by mouth daily.     Potassium 99 MG TABS Take 1 tablet (99 mg total) by mouth daily.     Probiotic Product (PROBIOTIC PO) Take 1 capsule by mouth daily.      TURMERIC PO Take 900 mg by mouth daily.       hydrochlorothiazide (MICROZIDE) 12.5 MG capsule Take 1 capsule (12.5 mg total) by mouth daily. 90 capsule 3   losartan (COZAAR) 25 MG tablet Take 1 tablet (25 mg total) by mouth daily. 90 tablet 3   prednisoLONE acetate (PRED FORTE) 1 % ophthalmic suspension  (Patient not taking: Reported on 12/22/2021)     sertraline (ZOLOFT) 25 MG tablet Take 1.5 tablets (37.5 mg total) by mouth at bedtime. (Patient not taking: Reported on 12/22/2021) 135 tablet 3   No current facility-administered medications on file prior to visit.    Allergies  Allergen Reactions   Blood-Group Specific Substance    Lipitor [Atorvastatin] Other (See Comments)    Cramps   Pravastatin Other (See Comments)    Cramps     Past Medical History:  Diagnosis Date   Abdominal pain 12/2012   [facility] ER - mesenteric adenitis 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 left axis deviation;  b. 12/2011 Echo: EF 55-60%, no rwma, pasp 63mHg.   Abnormal EKG    Chronic LAFB, incomplete RBBB Has been previously cleared by cardiology for surgery (Rockey Situ2013, 2015)   Basal cell carcinoma 02/2021   R nose tip ([place]ax)  Cervical neck pain with evidence of disc disease 08/05/2016   Last Assessment & Plan:  Ongoing cervical neck pain after fall. NSAIDs effective for relief. She has started seeing chiropractor. Reviewed ER workup including CT showing evidence of bulging disc and disc space loss.   Chronic nasal congestion 04/19/2017   Ongoing since sinus surgery 05/2016  S/p ENT and allergist eval.    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 Recovering well from this.   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   S/p bilateral knee replacement  Formatting of this note might be different from the original. 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 Whitworth/Lomax/Stinehelfer), R tibia x4+, L tibia x2, L dorsal hand   TMJ tenderness, right 02/08/2018   Tubular adenoma of colon 08/2014   Varicose veins of both lower extremities with pain 05/11/2020   Vitamin D deficiency     Past Surgical History:  Procedure  Laterality Date   AUGMENTATION MAMMAPLASTY     BREAST ENHANCEMENT SURGERY     BUNIONECTOMY  2013   R and L foot   CATARACT EXTRACTION Bilateral 2013   R, pending L   COLONOSCOPY  2007   no records received   COLONOSCOPY  08/2014   tubular adenoma, rpt 5 yrs Fuller Plan)   COLONOSCOPY  06/2020   4 polyps removed, TA, no rpt recommended Fuller Plan)   CONVERSION TO TOTAL HIP Right 12/07/2017   Procedure: CONVERSION TO TOTAL HIP ( REMOVING SMITH AND NEPHEW NAIL);  Surgeon: Hessie Knows, MD;  Location: [facility];  Service: Orthopedics;  Laterality: Right;   COSMETIC SURGERY  2002   face lift   dexa  06/2008  T -1.4 spine and hip   ETHMOIDECTOMY Bilateral 05/19/2016   Procedure: ETHMOIDECTOMY;  Surgeon: Carloyn Manner, MD;  Location: [facility];  Service: ENT;  Laterality: Bilateral;   EYE SURGERY     bilateral cataract surgery   FRONTAL SINUS EXPLORATION Bilateral 05/19/2016   Procedure: FRONTAL SINUS EXPLORATION;  Surgeon: Carloyn Manner, MD;  Location: [facility];  Service: ENT;  Laterality: Bilateral;   INTRAMEDULLARY (IM) NAIL INTERTROCHANTERIC Right 07/08/2017   Procedure: RIGHT INTERTROCHANTRIC IM NAIL;  Surgeon: Leandrew Koyanagi, MD;  Location: [place];  Service: Orthopedics;  Laterality: Right;   MAXILLARY ANTROSTOMY Bilateral 05/19/2016   Procedure: MAXILLARY ANTROSTOMY;  Surgeon: Carloyn Manner, MD;  Location: [facility];  Service: ENT;  Laterality: Bilateral;   MOHS SURGERY  2011   R leg   PARTIAL HYSTERECTOMY  1980   fibroids, ovaries remained   SINUS ENDO W/FUSION N/A 05/19/2016   Procedure: ENDOSCOPIC SINUS SURGERY WITH NAVIGATION;  Surgeon: Carloyn Manner, MD;  Location: [facility];  Service: ENT;  Laterality: N/A;   SPHENOIDECTOMY Bilateral 05/19/2016   Procedure: Coralee Pesa;  Surgeon: Carloyn Manner, MD;  Location: [facility];  Service: ENT;  Laterality: Bilateral;   SQUAMOUS CELL CARCINOMA EXCISION Right 07/13/2016   Dr. Rolm Bookbinder, [facility]   TONSILLECTOMY  AND Dawson Right 08/07/2019   Procedure: Right hip acetabular versus total hip arthroplasty revision-posterior;  Surgeon: Gaynelle Arabian, MD;  Location: [facility];  Service: Orthopedics;  Laterality: Right;  129mn   TOTAL KNEE ARTHROPLASTY  01/02/2012   Right - Surgeon: RLorn Junes MD   TOTAL KNEE ARTHROPLASTY Left 03/18/2013   Surgeon: RLorn Junes MD    Family History  Problem Relation Age of Onset   Cancer Mother 851      lung, smoker   Dementia Mother    Alzheimer's disease Mother        onset likely in mid 733s  Cancer Father        lung, smoker   Cancer Maternal Grandmother 434      leukemia   Coronary artery disease Paternal Grandmother 75      sudden cardiac death   Diabetes Neg Hx    Stroke Neg Hx    Breast cancer Neg Hx    Colon cancer Neg Hx    Esophageal cancer Neg Hx    Rectal cancer Neg Hx    Stomach cancer Neg Hx     Social History   Socioeconomic History   Marital status: Widowed    Spouse name: Not on file   Number of children: 3   Years of education: 12   Highest education level: High school graduate  Occupational History   Occupation: Retired  Tobacco Use   Smoking status: Never   Smokeless tobacco: Never  Vaping Use   Vaping Use: Never used  Substance and Sexual Activity   Alcohol use: Yes    Alcohol/week: 0.0 standard drinks of alcohol    Comment: occasional   Drug use: No   Sexual activity: Not Currently  Other Topics Concern   Not on file  Social History Narrative   Lives alone.  Brother lives nearby.   Occupation: retired, was sNetwork engineerand housewife   Edu: 1 yr college   Activity: limited by knee   Diet: good water, fruits/vegetables daily   Religion: Jehova's witness   Social Determinants of Health   Financial Resource Strain: Low Risk  (04/19/2021)  Overall Financial Resource Strain (CARDIA)    Difficulty of Paying Living Expenses: Not hard at all  Food Insecurity: No Food  Insecurity (08/29/2019)   Hunger Vital Sign    Worried About Running Out of Food in the Last Year: Never true    Ran Out of Food in the Last Year: Never true  Transportation Needs: No Transportation Needs (04/19/2021)   PRAPARE - Hydrologist (Medical): No    Lack of Transportation (Non-Medical): No  Physical Activity: Insufficiently Active (08/29/2019)   Exercise Vital Sign    Days of Exercise per Week: 3 days    Minutes of Exercise per Session: 40 min  Stress: No Stress Concern Present (08/29/2019)   [place]    Feeling of Stress : Not at all  Social Connections: Not on file  Intimate Partner Violence: Not At Risk (08/29/2019)   Humiliation, Afraid, Rape, and Kick questionnaire    Fear of Current or Ex-Partner: No    Emotionally Abused: No    Physically Abused: No    Sexually Abused: No   Review of Systems No headache No N/V Appetite is off--but is eating some     Objective:   Physical Exam Constitutional:      Appearance: Normal appearance.  HENT:     Head:     Comments: No sinus tenderness    Nose:     Comments: Mild congestion    Mouth/Throat:     Pharynx: No oropharyngeal exudate or posterior oropharyngeal erythema.  Pulmonary:     Effort: Pulmonary effort is normal.     Breath sounds: Normal breath sounds. No wheezing or rales.  Musculoskeletal:     Cervical back: Neck supple.  Lymphadenopathy:     Cervical: No cervical adenopathy.  Neurological:     Mental Status: She is alert.            Assessment & Plan:

## 2021-12-22 NOTE — Assessment & Plan Note (Signed)
Classic history of apparent viral infection---now into a secondary bacterial infection Discussed supportive care (mostly tylenol) Will try amoxil---diarrhea with augmentin Switch if doesn't respond (augmentin or doxy)

## 2022-01-19 DIAGNOSIS — Z85828 Personal history of other malignant neoplasm of skin: Secondary | ICD-10-CM | POA: Diagnosis not present

## 2022-01-19 DIAGNOSIS — L570 Actinic keratosis: Secondary | ICD-10-CM | POA: Diagnosis not present

## 2022-01-19 DIAGNOSIS — L814 Other melanin hyperpigmentation: Secondary | ICD-10-CM | POA: Diagnosis not present

## 2022-01-19 DIAGNOSIS — L821 Other seborrheic keratosis: Secondary | ICD-10-CM | POA: Diagnosis not present

## 2022-01-19 DIAGNOSIS — D1801 Hemangioma of skin and subcutaneous tissue: Secondary | ICD-10-CM | POA: Diagnosis not present

## 2022-01-19 DIAGNOSIS — D225 Melanocytic nevi of trunk: Secondary | ICD-10-CM | POA: Diagnosis not present

## 2022-01-20 ENCOUNTER — Ambulatory Visit: Payer: Medicare Other | Attending: Family Medicine

## 2022-01-20 DIAGNOSIS — R278 Other lack of coordination: Secondary | ICD-10-CM | POA: Diagnosis not present

## 2022-01-20 DIAGNOSIS — M6281 Muscle weakness (generalized): Secondary | ICD-10-CM | POA: Diagnosis not present

## 2022-01-20 DIAGNOSIS — M6289 Other specified disorders of muscle: Secondary | ICD-10-CM

## 2022-01-20 NOTE — Therapy (Signed)
OUTPATIENT PHYSICAL THERAPY FEMALE PELVIC DISCHARGE  Patient Name: Shalin Linders MRN: [id number] DOB:1940-06-10, 81 y.o., female Today's Date: 01/20/2022   PT End of Session - 01/20/22 1521     Visit Number 11    Number of Visits 20    Date for PT Re-Evaluation 02/24/22    Authorization Type IE: 09/24/21; PN/RC: 12/09/21    PT Start Time 1525    PT Stop Time 1605    PT Time Calculation (min) 40 min    Activity Tolerance Patient tolerated treatment well             Past Medical History:  Diagnosis Date   Abdominal pain 12/2012   [facility] ER - mesenteric adenitis 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 left axis deviation;  b. 12/2011 Echo: EF 55-60%, no rwma, pasp 74mHg.   Abnormal EKG    Chronic LAFB, incomplete RBBB Has been previously cleared by cardiology for surgery ([place] Situ2013, 2015)   Basal cell carcinoma 02/2021   R nose tip ([place])   Cervical neck pain with evidence of disc disease 08/05/2016   Last Assessment & Plan:  Ongoing cervical neck pain after fall. NSAIDs effective for relief. She has started seeing chiropractor. Reviewed ER workup including CT showing evidence of bulging disc and disc space loss.   Chronic nasal congestion 04/19/2017   Ongoing since sinus surgery 05/2016  S/p ENT and allergist eval.    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 Recovering well from this.   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   S/p bilateral knee replacement  Formatting of this note might be different from the original. 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 Whitworth/[place]/Stinehelfer), R tibia x4+, L tibia x2, L dorsal hand   TMJ tenderness, right 02/08/2018   Tubular adenoma of colon 08/2014   Varicose veins of both lower extremities with pain 05/11/2020   Vitamin D deficiency    Past Surgical History:  Procedure Laterality Date   AUGMENTATION MAMMAPLASTY     BREAST ENHANCEMENT SURGERY     BUNIONECTOMY  2013   R and L foot   CATARACT EXTRACTION Bilateral 2013   R, pending L   COLONOSCOPY  2007   no records received    COLONOSCOPY  08/2014   tubular adenoma, rpt 5 yrs Fuller Plan)   COLONOSCOPY  06/2020   4 polyps removed, TA, no rpt recommended Fuller Plan)   CONVERSION TO TOTAL HIP Right 12/07/2017   Procedure: CONVERSION TO TOTAL HIP ( REMOVING SMITH AND NEPHEW NAIL);  Surgeon: Hessie Knows, MD;  Location: [facility];  Service: Orthopedics;  Laterality: Right;   COSMETIC SURGERY  2002   face lift   dexa  06/2008   T -1.4 spine and hip   ETHMOIDECTOMY Bilateral 05/19/2016   Procedure: ETHMOIDECTOMY;  Surgeon: Carloyn Manner, MD;  Location: [facility];  Service: ENT;  Laterality: Bilateral;   EYE SURGERY     bilateral cataract surgery   FRONTAL SINUS EXPLORATION Bilateral 05/19/2016   Procedure: FRONTAL SINUS EXPLORATION;  Surgeon: Carloyn Manner, MD;  Location: [facility];  Service: ENT;  Laterality: Bilateral;   INTRAMEDULLARY (IM) NAIL INTERTROCHANTERIC Right 07/08/2017   Procedure: RIGHT INTERTROCHANTRIC IM NAIL;  Surgeon: Leandrew Koyanagi, MD;  Location: [place];  Service: Orthopedics;  Laterality: Right;   MAXILLARY ANTROSTOMY Bilateral 05/19/2016   Procedure: MAXILLARY ANTROSTOMY;  Surgeon: Carloyn Manner, MD;  Location: [facility];  Service: ENT;  Laterality: Bilateral;   MOHS SURGERY  2011   R leg   PARTIAL HYSTERECTOMY  1980   fibroids, ovaries remained   SINUS ENDO W/FUSION N/A 05/19/2016   Procedure: ENDOSCOPIC SINUS SURGERY WITH NAVIGATION;  Surgeon: Carloyn Manner, MD;  Location: [facility];  Service: ENT;  Laterality: N/A;   SPHENOIDECTOMY Bilateral 05/19/2016   Procedure: Coralee Pesa;  Surgeon: Carloyn Manner, MD;  Location: [facility];  Service: ENT;  Laterality: Bilateral;   SQUAMOUS CELL CARCINOMA EXCISION Right 07/13/2016   Dr. Rolm Bookbinder, [facility]   TONSILLECTOMY AND [place] Right 08/07/2019   Procedure: Right hip acetabular versus total hip arthroplasty revision-posterior;  Surgeon: Gaynelle Arabian, MD;  Location: [facility];  Service:  Orthopedics;  Laterality: Right;  165mn   TOTAL KNEE ARTHROPLASTY  01/02/2012   Right - Surgeon: RLorn Junes MD   TOTAL KNEE ARTHROPLASTY Left 03/18/2013   Surgeon: RLorn Junes MD   Patient Active Problem List   Diagnosis Date Noted   Acute non-recurrent pansinusitis 12/22/2021   Urinary, incontinence, stress female 09/10/2021   Osteoarthritis of carpometacarpal ([facility] joint of right thumb 09/10/2021   Left rib fracture 02/08/2021   Pre-diabetes 12/23/2020   Mild cognitive impairment 12/23/2020   Generalized anxiety disorder    Varicose veins of both lower extremities with pain 05/11/2020   Chronic rhinitis 03/10/2020   Elevated blood pressure reading without diagnosis of hypertension 03/10/2020   Health maintenance examination 09/05/2019   GERD (gastroesophageal reflux disease) 09/05/2019   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   Situational stress 07/10/2019   Osteopenia 09/03/2018   HLD (hyperlipidemia) 09/03/2018   Vitamin D deficiency  09/03/2018   Leg cramping 08/15/2018   Hypothyroidism 08/15/2018   TMJ tenderness, right 02/08/2018   Lactose intolerance 02/08/2018   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   Chronic nasal congestion 04/19/2017   Exertional dyspnea 04/19/2017   Cervical neck pain with evidence of disc disease 08/05/2016   Advanced care planning/counseling discussion 07/31/2014   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   Osteoarthritis of knee 03/18/2013   Postoperative nausea and vomiting    Transfusion of blood product refused for religious reason    Medicare annual wellness visit, subsequent 01/14/2013   Elevated alkaline phosphatase level 01/03/2013   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 No  OCCUPATION/SOCIAL ACTIVITIES: swimming, walking, dinner, playing cards  PLOF: Independent  PERTINENT HISTORY/CHART REVIEW: From 02/08/21 note with [name]: Diagnosed with mild cognitive impairment of unclear etiology, related to anxiety > depression. Not consistent of alzheimers or other neurodegenerative illness at this time.   12/30/20 note with [name] - Neurology - Pt presented for mild cognitive impairment. Discussed results.   CHIEF CONCERN: Pt has constant urinary leakage with coughing/laughing/sneezing/jumping. Pt has not noticed urinary leakage with walking, but her pantyliner is soiled enough where she needs to change it. Pt has had leakage more than 3 years ago but it is now more constant. Pt has had instances of bowel leakage, but not regularly. Pt has had to change undergarments because she feels it is just gas but it is not. Pt does notice when she goes to urinate and then wipes there is a bowel stain although she did not have a BM.   Pain type: aching Pain description: constant   Aggravating factors: prolonged sitting, household chores  Relieving factors: dry needling. Chiropractor, heat  LIVING ENVIRONMENT: Lives with: lives alone, has a son in [place]  Lives in: House/apartment   PATIENT GOALS: Increase strength in the deep core, I would like to not leak and use of pantyliners or smelling like urine     UROLOGICAL HISTORY Fluid intake: flavored water (1-2/day), 2 cups of coffee   Pain with urination: No Fully empty bladder: No Stream: stop and go  Urgency: No Frequency: 5-6x Nocturia: 0x Leakage: Coughing,  Sneezing, Laughing, Exercise, and Lifting Pads: Yes Type: pantyliners Amount: 3x/day, soiled, wearing through the night  Bladder control (0-10): 5/10   GASTROINTESTINAL HISTORY Leakage: Yes but very rarely  Pads: No    SEXUAL HISTORY/FUNCTION Pt has no concerns   OBSTETRICAL HISTORY Vaginal deliveries: G3P3 Tearing: Yes: stiches with first child    GYNECOLOGICAL HISTORY Hysterectomy: yes, partial hysterectomy  Pelvic Organ Prolapse: None Heaviness/pressure: no   SUBJECTIVE:  Pt overall has been doing well in relation to the pelvic floor. Pt has been recovering from bronchitis and a bad cold. Pt also reports increased pain in the L hip suddenly and has an appt with her orthopedic doctor in December. DPT mentioned receiving a referral for PT for the  L hip depending on what the provider suggests.             PAIN:  Are you having pain? No NPRS: 0/10                                                                                                                                                                              OBJECTIVE:   COGNITION: Overall cognitive status: History of cognitive impairments - at baseline -see above in pertinent history/chart review    POSTURE:  Iliac crest height: L iliac crest higher   Pelvic obliquity: L posteriorly rotated   SENSATION:  Light touch: intact, L2-S2 dermatomes     RANGE OF MOTION:    (Norm range in degrees)  LEFT 10/07/21 RIGHT 10/07/21  Lumbar forward flexion (65):  WNL    Lumbar extension (30): WNL    Lumbar lateral flexion (25):  WNL WNL  Thoracic and Lumbar rotation (30 degrees):    WNL WNL  Hip Flexion (0-125):   Restricted* WNL*  Hip IR (0-45):  WNL WNL  Hip ER (0-45):  WNL WNL  Hip Adduction:      Hip Abduction (0-40):  Restricted* WNL  Hip extension (0-15):     (*= pain, Blank rows = not tested)   STRENGTH: MMT    RLE 10/07/21 LLE 10/07/21  Hip Flexion 4 4  Hip Extension 4 4  Hip Abduction     Hip  Adduction     Hip ER  5 4  Hip IR  5 4*  Knee Extension 5 5  Knee Flexion 4 4  Dorsiflexion     Plantarflexion (seated) 5 5  (*= pain, Blank rows = not tested)   SPECIAL TESTS:   FABER (SN 81): negative B FADIR (SN 94): negative B  PALPATION:  Abdominal:  Diastasis:  2 fingers below umbilicus, Valsalva maneuver noted Rib flare: present B  EXTERNAL PELVIC EXAM: Patient educated on the purpose of the pelvic exam and articulated understanding; patient consented to the exam verbally.  Breath coordination: inconsistent  Voluntary Contraction: present, 2/5 MMT but felt spasm of PFM Relaxation: delayed Perineal movement with sustained IAP increase ("bear down"): minimal descent, Valsalva maneuver noted Perineal movement with rapid IAP increase ("cough"): no change (0= no contraction, 1= flicker, 2= weak squeeze, 3= fair squeeze with lift, 4= good squeeze and lift against resistance, 5= strong squeeze against strong resistance)    TODAY'S TREATMENT  Neuromuscular Re-education: Review of goals below:  6/6 goals met  FOTO from 10/5 - MET goal  Significant improvement from IE and very minimal leakage, if any   Added to HEP:  Standing wall squats, x10, with coordinated breath for improved  IAP management, VCs and TCs required   Pallof press, B x10, with coordinated breath for improved IAP management, VCs and TCs required   Patient response to interventions: Pt comfortable to discharge.    Patient Education:  Patient provided with HEP including: standing wall squat and pallof press. Patient educated throughout session on appropriate technique and form using multi-modal cueing, HEP, and activity modification. Patient will continue to benefit from further education in order to maximize compliance and understanding for long-term therapeutic gains.    ASSESSMENT:  Clinical Impression: Patient with excellent motivation to participate in today's discharge. Pt has demonstrated  significant improvement in chief concern of urinary leakage since IE on 09/24/21 and re-eval on 12/09/21. Pt has very minimal urinary leakage throughout the day and at times has no leakage. Pt has been able to continue to wear a pantyliner, but at times reports forgetting to use one and has not had urinary leakage (IE: 3x/day). Pt has had minimal SUI (2x over the past 3 weeks). Pt continues to be compliant with HEP and DPT added a few more standing TrA activation with coordinated breath for improved IAP management. Pt required VCs and TCs for proper technique and to decrease bodily compensations (knee hyperextension). Pt initially had bowel leakage but that has completely resolved according to subjective hx and FOTO scores of 56 (IE : 41). Pt has met 6/6 goals listed below as well as the target score for FOTO Urinary Problem (57) and Lumbar Spine (61). Pt is adequate for discharge from PFPT. Pt given rehab services card to address L hip concerns depending on what patient and provider decide at orthopedic appointment. Pt verbalized understanding.    Objective Impairments: decreased activity tolerance, decreased coordination, decreased endurance, decreased strength, improper body mechanics, postural dysfunction, and pain.   Activity Limitations: lifting, bending, squatting, continence, toileting, and locomotion level  Personal Factors: Age, Past/current experiences, Time since onset of injury/illness/exacerbation, and 3+ comorbidities: mild cognitive impairment, HTN, osteopenia  are also affecting patient's functional outcome.   Rehab Potential: Good  Clinical Decision Making: Evolving/moderate complexity  Evaluation Complexity: Moderate   GOALS: Goals reviewed with patient? Yes  SHORT TERM GOALS: Target date: 11/05/21  Patient will demonstrate independent and coordinated diaphragmatic breathing in supine with a 1:2 breathing pattern for improved down-regulation of the nervous system and improved  management of intra-abdominal pressures in order to increase function at home and in the community. Baseline: (8/3): Valsalva maneuver throughout session and contracted rectus/obliques with inhalation; (9/1): able to demonstrate diaphragmatic breathing with no cueing  Goal status: MET    LONG TERM GOALS: Target date: 02/24/22  Patient will decrease worst lumbar spine pain as reported on NPRS by at least 2 points to demonstrate clinically significant reduction in pain in order to restore/improve function and overall QOL. Baseline: 10/10, (10/5): 4/10 (8/10 when out in the yard or during physical activity) Goal status: MET  2.  Patient will report being able to return to activities including, but not limited to: walking, reading, household chores, and prolonged physical activity without pain or limitation to indicate improved core strengthening and return to prior level of participation at home and in the community. Baseline: moderate-quite a bit of difficulty performing household activities, sitting for long periods of time; (10/5): Limited, a little (FOTO response) more vigorous activities and some household activities; (11/16): met goal above  Goal status: MET  3.  Patient will report decreased reliance on protective undergarments as indicated by a 24 hour period to demonstrate  improved bladder control and allow for increased participation in activities outside of the home. Baseline: pantyliners/3x per day; (10/5): pantyliner, 1x/day, (11/16): pantyliner, 1x/day if that but more dry, sometimes she forgets to put one on Goal status: MET   4.  Patient will report less than 5 incidents of stress urinary incontinence over the course of 3 weeks while coughing/sneezing/laughing/prolonged activity in order to demonstrate improved PFM coordination, strength, and function for improved overall QOL. Baseline: leakage occurs every time with all the above; (10/5): 3 incidents over the past 3 weeks; (11/16):  2 or less over the past 3 weeks Goal status: MET  5.  Patient will report confidence in ability to control bladder > 7/10 in order to demonstrate improved function and ability to participate more fully in activities at home and in the community. Baseline: 5/10; (10/5): 8/10 Goal status: MET    PLAN: PT Frequency: 1x/week  PT Duration: 12 weeks  Planned Interventions: Therapeutic exercises, Therapeutic activity, Neuromuscular re-education, Balance training, Gait training, Patient/Family education, Self Care, Joint mobilization, Cryotherapy, Moist heat, scar mobilization, Taping, and Manual therapy     Ysabela Keisler, PT, DPT  01/20/2022, 4:09 PM

## 2022-03-03 DIAGNOSIS — M25552 Pain in left hip: Secondary | ICD-10-CM | POA: Diagnosis not present

## 2022-03-16 DIAGNOSIS — M25552 Pain in left hip: Secondary | ICD-10-CM | POA: Diagnosis not present

## 2022-03-28 ENCOUNTER — Encounter: Payer: Self-pay | Admitting: Family Medicine

## 2022-03-28 ENCOUNTER — Ambulatory Visit (INDEPENDENT_AMBULATORY_CARE_PROVIDER_SITE_OTHER): Payer: Medicare Other | Admitting: Family Medicine

## 2022-03-28 VITALS — BP 124/82 | HR 82 | Temp 97.6°F | Ht 64.5 in | Wt 142.0 lb

## 2022-03-28 DIAGNOSIS — R7303 Prediabetes: Secondary | ICD-10-CM

## 2022-03-28 DIAGNOSIS — G3184 Mild cognitive impairment, so stated: Secondary | ICD-10-CM

## 2022-03-28 DIAGNOSIS — L659 Nonscarring hair loss, unspecified: Secondary | ICD-10-CM

## 2022-03-28 DIAGNOSIS — R634 Abnormal weight loss: Secondary | ICD-10-CM

## 2022-03-28 DIAGNOSIS — E039 Hypothyroidism, unspecified: Secondary | ICD-10-CM | POA: Diagnosis not present

## 2022-03-28 DIAGNOSIS — R490 Dysphonia: Secondary | ICD-10-CM | POA: Diagnosis not present

## 2022-03-28 DIAGNOSIS — R1084 Generalized abdominal pain: Secondary | ICD-10-CM | POA: Diagnosis not present

## 2022-03-28 DIAGNOSIS — K219 Gastro-esophageal reflux disease without esophagitis: Secondary | ICD-10-CM | POA: Diagnosis not present

## 2022-03-28 DIAGNOSIS — R09A2 Foreign body sensation, throat: Secondary | ICD-10-CM | POA: Diagnosis not present

## 2022-03-28 DIAGNOSIS — K654 Sclerosing mesenteritis: Secondary | ICD-10-CM | POA: Diagnosis not present

## 2022-03-28 HISTORY — DX: Abnormal weight loss: R63.4

## 2022-03-28 HISTORY — DX: Generalized abdominal pain: R10.84

## 2022-03-28 NOTE — Assessment & Plan Note (Signed)
Stable period - minimal symptoms off aricept.

## 2022-03-28 NOTE — Patient Instructions (Addendum)
Labs today  We will order contrasted CT abdomen and pelvis in [place]. Make sure to drink plenty of water after to clear contrast.  Keep [place] updated with any worsening or changing symptoms.  Take omeprazole over the counter '20mg'$  1 tablet daily 30 min before largest meal for 2 weeks.

## 2022-03-28 NOTE — Assessment & Plan Note (Signed)
Update A1c ?

## 2022-03-28 NOTE — Progress Notes (Signed)
Patient ID: [name], female    DOB: 1940/05/22, 82 y.o.   MRN: [id number]  This visit was conducted in person.  BP 124/82   Pulse 82   Temp 97.6 F (36.4 C) (Temporal)   Ht 5' 4.5" (1.638 m)   Wt 142 lb (64.4 kg)   SpO2 98%   BMI 24.00 kg/m    CC: weight loss  Subjective:   HPI: Kristin Kramer is a 82 y.o. female presenting on 03/28/2022 for Weight Loss (Concerned about recent wt loss. Noticed about 2 wks ago. States she lost 4 lb last week. )   Weight loss noted over the past 4 weeks - clothing fitting looser, watch is looser. Also hasn't been feeling very well - staying nauseated, queasy without vomiting. Some acid reflux when she eats later in the evening despite pepcid. New constant hoarseness for several months, fullness to throat. She also notes she's losing her hair - eyelashes and eyebrows. Intermittent night sweats about once a month - wakes up at night and notes hair and body is wet, but not drenching. Increased fatigue. Some food related bloating. Early satiety.   Notes red itchy rash to left distal dorsal forearm. Improving with topical steroid.   No allergy symptoms. No cold symptoms.  No fevers/chills, dysphagia, vomiting, abd pain, chest pain, bowel changes, blood in stool or urine, pelvic pain.   Wt Readings from Last 3 Encounters:  03/28/22 142 lb (64.4 kg)  12/22/21 146 lb (66.2 kg)  09/10/21 147 lb 6 oz (66.8 kg)  05/27/2021 152 lb (68.9 kg) 02/08/2021 152 lb 9 oz (69.2 kg)  Colonoscopy 06/09/2020 - 4 polyps removed, TA, no rpt recommended [facility]).  [facility]  Well woman exam - 2012 by prior PCP. Hysterectomy for fibroids, ovaries remained. Menopause early 59s. no pelvic pain or vaginal bleeding. CT 2019 with normal adnexal region.      Relevant past medical, surgical, family and social history reviewed and updated as indicated. Interim medical history since our last visit reviewed. Allergies and medications  reviewed and updated. Outpatient Medications Prior to Visit  Medication Sig Dispense Refill   aspirin EC 81 MG tablet Take 81 mg by mouth daily. Swallow whole.     busPIRone (BUSPAR) 5 MG tablet Take 1 tablet (5 mg total) by mouth 2 (two) times daily as needed (anxiety). 180 tablet 3   calcium carbonate (OSCAL) 1500 (600 Ca) MG TABS tablet Take 600 mg of elemental calcium by mouth daily with breakfast.     Cholecalciferol (VITAMIN D-3) 5000 units TABS Take 5,000 Units by mouth daily.      Coenzyme Q10 (COQ10 PO) Take 1 capsule by mouth daily.      ezetimibe (ZETIA) 10 MG tablet Take 1 tablet (10 mg total) by mouth daily. 90 tablet 3   famotidine (PEPCID) 20 MG tablet Take 20 mg by mouth daily as needed for heartburn or indigestion.     fexofenadine (ALLEGRA ALLERGY) 180 MG tablet Take 1 tablet (180 mg total) by mouth daily.     hydrochlorothiazide (MICROZIDE) 12.5 MG capsule Take 1 capsule (12.5 mg total) by mouth daily. 90 capsule 3   levothyroxine (SYNTHROID) 50 MCG tablet Take 1 tablet (50 mcg total) by mouth daily before breakfast. 90 tablet 3   losartan (COZAAR) 25 MG tablet Take 1 tablet (25 mg total) by mouth daily. 90 tablet 3   Magnesium 250 MG TABS Take 1 tablet (250 mg total) by  mouth daily.  0   METAMUCIL FIBER PO Take by mouth daily.     niacinamide 500 MG tablet Take 1 tablet (500 mg total) by mouth daily.     Potassium 99 MG TABS Take 1 tablet (99 mg total) by mouth daily.     prednisoLONE acetate (PRED FORTE) 1 % ophthalmic suspension      Probiotic Product (PROBIOTIC PO) Take 1 capsule by mouth daily.      sertraline (ZOLOFT) 25 MG tablet Take 1.5 tablets (37.5 mg total) by mouth at bedtime. 135 tablet 3   TURMERIC PO Take 900 mg by mouth daily.      No facility-administered medications prior to visit.     Per HPI unless specifically indicated in ROS section below Review of Systems  Objective:  BP 124/82   Pulse 82   Temp 97.6 F (36.4 C) (Temporal)   Ht 5' 4.5"  (1.638 m)   Wt 142 lb (64.4 kg)   SpO2 98%   BMI 24.00 kg/m   Wt Readings from Last 3 Encounters:  03/28/22 142 lb (64.4 kg)  12/22/21 146 lb (66.2 kg)  09/10/21 147 lb 6 oz (66.8 kg)      Physical Exam Vitals and nursing note reviewed.  Constitutional:      Appearance: Normal appearance. She is not ill-appearing.  HENT:     Head: Normocephalic and atraumatic.     Mouth/Throat:     Mouth: Mucous membranes are moist.     Pharynx: Oropharynx is clear. No oropharyngeal exudate or posterior oropharyngeal erythema.  Eyes:     Extraocular Movements: Extraocular movements intact.     Conjunctiva/sclera: Conjunctivae normal.     Pupils: Pupils are equal, round, and reactive to light.  Neck:     Thyroid: No thyroid mass or thyromegaly.  Cardiovascular:     Rate and Rhythm: Regular rhythm. Bradycardia present.     Pulses: Normal pulses.     Heart sounds: Normal heart sounds. No murmur heard. Pulmonary:     Effort: Pulmonary effort is normal. No respiratory distress.     Breath sounds: Normal breath sounds. No wheezing, rhonchi or rales.  Abdominal:     General: Bowel sounds are normal. There is no distension.     Palpations: Abdomen is soft. There is no mass.     Tenderness: There is abdominal tenderness (moderate) in the epigastric area, suprapubic area and left lower quadrant. There is no right CVA tenderness, left CVA tenderness, guarding or rebound. Negative signs include Murphy's sign.     Hernia: No hernia is present.  Musculoskeletal:     Cervical back: Normal range of motion and neck supple. No rigidity.     Right lower leg: No edema.     Left lower leg: No edema.  Lymphadenopathy:     Cervical: No cervical adenopathy.  Skin:    General: Skin is warm and dry.     Findings: No rash.  Neurological:     Mental Status: She is alert.  Psychiatric:        Mood and Affect: Mood normal.        Behavior: Behavior normal.       Results for orders placed or performed in  visit on 09/02/21  Hemoglobin A1c  Result Value Ref Range   Hgb A1c MFr Bld 5.8 4.6 - 6.5 %  VITAMIN D 25 Hydroxy (Vit-D Deficiency, Fractures)  Result Value Ref Range   VITD 70.15 30.00 - 100.00 ng/mL  Lipid  panel  Result Value Ref Range   Cholesterol 226 (H) 0 - 200 mg/dL   Triglycerides 140.0 0.0 - 149.0 mg/dL   HDL 64.50 >39.00 mg/dL   VLDL 28.0 0.0 - 40.0 mg/dL   LDL Cholesterol 134 (H) 0 - 99 mg/dL   Total CHOL/HDL Ratio 4    NonHDL 161.57   Comprehensive metabolic panel  Result Value Ref Range   Sodium 139 135 - 145 mEq/L   Potassium 4.1 3.5 - 5.1 mEq/L   Chloride 100 96 - 112 mEq/L   CO2 31 19 - 32 mEq/L   Glucose, Bld 106 (H) 70 - 99 mg/dL   BUN 21 6 - 23 mg/dL   Creatinine, Ser 0.73 0.40 - 1.20 mg/dL   Total Bilirubin 0.6 0.2 - 1.2 mg/dL   Alkaline Phosphatase 105 39 - 117 U/L   AST 21 0 - 37 U/L   ALT 17 0 - 35 U/L   Total Protein 7.1 6.0 - 8.3 g/dL   Albumin 4.6 3.5 - 5.2 g/dL   GFR 77.60 >60.00 mL/min   Calcium 9.8 8.4 - 10.5 mg/dL  TSH  Result Value Ref Range   TSH 5.70 (H) 0.35 - 5.50 uIU/mL    Assessment & Plan:   Problem List Items Addressed This Visit     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      Hypothyroidism    Update TSH on levothyroxine 15mg daily.       GERD (gastroesophageal reflux disease)    GERD symptoms despite pepcid '20mg'$  daily - start 2 wk course of omeprazole '20mg'$  OTC.       Pre-diabetes    Update A1c.       Relevant Orders   Hemoglobin A1c   Mild cognitive impairment    Stable period - minimal symptoms off aricept.       Unintended weight loss - Primary    10 lb weight loss in the past year, 4 lbs in the past few months associated with loss of appetite, nausea, malaise, night sweats and abdominal discomfort.  UTD colon cancer screening, mammogram. S/p hysterectomy, ovaries remain.  Check labwork for further evaluation today, will check contrasted abd/pelvic CT as well.  Pt agrees with  plan.       Relevant Orders   Comprehensive metabolic panel   TSH   CBC with Differential/Platelet   Ferritin   IBC panel   Vitamin B12   Lipase   CT Abdomen Pelvis W Contrast   Generalized abdominal pain    Epigastric along with suprapubic and LLQ discomfort worse with palpation.  Check labs today. With associated weight loss, discussed further evaluation with CT imaging.       Relevant Orders   Comprehensive metabolic panel   TSH   CBC with Differential/Platelet   Ferritin   IBC panel   Vitamin B12   Lipase   CT Abdomen Pelvis W Contrast   Hoarseness of voice    Present for weeks, with globus sensation. Also notes worsening reflux despite pepcid - will recommend start omeprazole '20mg'$  daily for 2 wks then PRN. Await labs and imaging. If unrevealing, discussed ENT eval.       Other Visit Diagnoses     Hair loss       Globus sensation            No orders of the defined types were placed in this encounter.   Orders Placed This Encounter  Procedures   CT Abdomen Pelvis W Contrast    Standing Status:   Future    Standing Expiration Date:   03/30/2023    Order Specific Question:   If indicated for the ordered procedure, I authorize the administration of contrast media per Radiology protocol    Answer:   Yes    Order Specific Question:   Does the patient have a contrast media/X-ray dye allergy?    Answer:   No    Order Specific Question:   Preferred imaging location?    Answer:   [facility]    Order Specific Question:   Is Oral Contrast requested for this exam?    Answer:   Yes, Per Radiology protocol   Comprehensive metabolic panel   TSH   CBC with Differential/Platelet   Ferritin   IBC panel   Vitamin B12   Lipase   Hemoglobin A1c    Patient Instructions  Labs today  We will order contrasted CT abdomen and pelvis in [place]. Make sure to drink plenty of water after to clear contrast.  Keep [place] updated with any worsening or changing symptoms.   Take omeprazole over the counter '20mg'$  1 tablet daily 30 min before largest meal for 2 weeks.   Follow up plan: Return if symptoms worsen or fail to improve.  [name], MD

## 2022-03-28 NOTE — Assessment & Plan Note (Signed)
H/o this, has previously seen GI.

## 2022-03-28 NOTE — Assessment & Plan Note (Signed)
GERD symptoms despite pepcid '20mg'$  daily - start 2 wk course of omeprazole '20mg'$  OTC.

## 2022-03-29 ENCOUNTER — Encounter: Payer: Self-pay | Admitting: *Deleted

## 2022-03-29 DIAGNOSIS — R490 Dysphonia: Secondary | ICD-10-CM

## 2022-03-29 HISTORY — DX: Dysphonia: R49.0

## 2022-03-29 LAB — CBC WITH DIFFERENTIAL/PLATELET
Basophils Absolute: 0 10*3/uL (ref 0.0–0.1)
Basophils Relative: 0.7 % (ref 0.0–3.0)
Eosinophils Absolute: 0.1 10*3/uL (ref 0.0–0.7)
Eosinophils Relative: 1.7 % (ref 0.0–5.0)
HCT: 41.4 % (ref 36.0–46.0)
Hemoglobin: 14 g/dL (ref 12.0–15.0)
Lymphocytes Relative: 19.2 % (ref 12.0–46.0)
Lymphs Abs: 1.3 10*3/uL (ref 0.7–4.0)
MCHC: 33.8 g/dL (ref 30.0–36.0)
MCV: 88.7 fl (ref 78.0–100.0)
Monocytes Absolute: 0.6 10*3/uL (ref 0.1–1.0)
Monocytes Relative: 9.3 % (ref 3.0–12.0)
Neutro Abs: 4.6 10*3/uL (ref 1.4–7.7)
Neutrophils Relative %: 69.1 % (ref 43.0–77.0)
Platelets: 266 10*3/uL (ref 150.0–400.0)
RBC: 4.66 Mil/uL (ref 3.87–5.11)
RDW: 13.6 % (ref 11.5–15.5)
WBC: 6.7 10*3/uL (ref 4.0–10.5)

## 2022-03-29 LAB — COMPREHENSIVE METABOLIC PANEL
ALT: 35 U/L (ref 0–35)
AST: 29 U/L (ref 0–37)
Albumin: 4.7 g/dL (ref 3.5–5.2)
Alkaline Phosphatase: 96 U/L (ref 39–117)
BUN: 28 mg/dL — ABNORMAL HIGH (ref 6–23)
CO2: 29 mEq/L (ref 19–32)
Calcium: 9.9 mg/dL (ref 8.4–10.5)
Chloride: 99 mEq/L (ref 96–112)
Creatinine, Ser: 0.87 mg/dL (ref 0.40–1.20)
GFR: 62.61 mL/min (ref >60.00)
Glucose, Bld: 96 mg/dL (ref 70–99)
Potassium: 3.7 mEq/L (ref 3.5–5.1)
Sodium: 139 mEq/L (ref 135–145)
Total Bilirubin: 0.6 mg/dL (ref 0.2–1.2)
Total Protein: 7.5 g/dL (ref 6.0–8.3)

## 2022-03-29 LAB — TSH: TSH: 1.41 u[IU]/mL (ref 0.35–5.50)

## 2022-03-29 LAB — FERRITIN: Ferritin: 38.6 ng/mL (ref 10.0–291.0)

## 2022-03-29 LAB — HEMOGLOBIN A1C: Hgb A1c MFr Bld: 5.9 % (ref 4.6–6.5)

## 2022-03-29 LAB — IBC PANEL
Iron: 166 ug/dL — ABNORMAL HIGH (ref 42–145)
Saturation Ratios: 41.8 % (ref 20.0–50.0)
TIBC: 397.6 ug/dL (ref 250.0–450.0)
Transferrin: 284 mg/dL (ref 212.0–360.0)

## 2022-03-29 LAB — LIPASE: Lipase: 43 U/L (ref 11.0–59.0)

## 2022-03-29 LAB — VITAMIN B12: Vitamin B-12: 283 pg/mL (ref 211–911)

## 2022-03-29 NOTE — Assessment & Plan Note (Signed)
10 lb weight loss in the past year, 4 lbs in the past few months associated with loss of appetite, nausea, malaise, night sweats and abdominal discomfort.  UTD colon cancer screening, mammogram. S/p hysterectomy, ovaries remain.  Check labwork for further evaluation today, will check contrasted abd/pelvic CT as well.  Pt agrees with plan.

## 2022-03-29 NOTE — Assessment & Plan Note (Signed)
Epigastric along with suprapubic and LLQ discomfort worse with palpation.  Check labs today. With associated weight loss, discussed further evaluation with CT imaging.

## 2022-03-29 NOTE — Assessment & Plan Note (Signed)
Update TSH on levothyroxine 20mg daily.

## 2022-03-29 NOTE — Assessment & Plan Note (Addendum)
Present for weeks, with globus sensation. Also notes worsening reflux despite pepcid - will recommend start omeprazole '20mg'$  daily for 2 wks then PRN. Await labs and imaging. If unrevealing, discussed ENT eval.

## 2022-04-02 ENCOUNTER — Other Ambulatory Visit: Payer: Self-pay | Admitting: Family Medicine

## 2022-04-02 MED ORDER — VITAMIN B-12 1000 MCG PO TABS: 1000.0000 ug | ORAL_TABLET | Freq: Every day | ORAL | Status: DC

## 2022-04-02 MED ORDER — VITAMIN B-12 1000 MCG PO TABS: 1000.0000 ug | ORAL_TABLET | ORAL | Status: AC

## 2022-04-11 ENCOUNTER — Ambulatory Visit
Admission: RE | Admit: 2022-04-11 | Discharge: 2022-04-11 | Disposition: A | Discharge status: Home / Self Care | Payer: Medicare Other | Source: Ambulatory Visit | Attending: Family Medicine | Admitting: Family Medicine

## 2022-04-11 DIAGNOSIS — R634 Abnormal weight loss: Secondary | ICD-10-CM

## 2022-04-11 DIAGNOSIS — R1084 Generalized abdominal pain: Secondary | ICD-10-CM

## 2022-04-11 DIAGNOSIS — I700 Atherosclerosis of aorta: Secondary | ICD-10-CM | POA: Diagnosis not present

## 2022-04-11 DIAGNOSIS — K654 Sclerosing mesenteritis: Secondary | ICD-10-CM | POA: Diagnosis not present

## 2022-04-11 DIAGNOSIS — R100 Acute abdomen: Secondary | ICD-10-CM | POA: Diagnosis not present

## 2022-04-11 MED ORDER — IOPAMIDOL (ISOVUE-300) INJECTION 61%
100.0000 mL | Freq: Once | INTRAVENOUS | Status: AC | PRN
  Administered 2022-04-11: 100 mL via INTRAVENOUS

## 2022-04-18 ENCOUNTER — Encounter: Payer: Self-pay | Admitting: Family Medicine

## 2022-04-18 DIAGNOSIS — I700 Atherosclerosis of aorta: Secondary | ICD-10-CM | POA: Insufficient documentation

## 2022-04-18 HISTORY — DX: Atherosclerosis of aorta: I70.0

## 2022-04-26 ENCOUNTER — Encounter: Payer: Self-pay | Admitting: Family Medicine

## 2022-04-26 ENCOUNTER — Ambulatory Visit (INDEPENDENT_AMBULATORY_CARE_PROVIDER_SITE_OTHER): Payer: Medicare Other | Admitting: Family Medicine

## 2022-04-26 VITALS — BP 118/70 | HR 77 | Temp 97.2°F | Ht 64.5 in | Wt 143.2 lb

## 2022-04-26 DIAGNOSIS — N6311 Unspecified lump in the right breast, upper outer quadrant: Secondary | ICD-10-CM | POA: Diagnosis not present

## 2022-04-26 DIAGNOSIS — R1084 Generalized abdominal pain: Secondary | ICD-10-CM | POA: Diagnosis not present

## 2022-04-26 DIAGNOSIS — R634 Abnormal weight loss: Secondary | ICD-10-CM | POA: Diagnosis not present

## 2022-04-26 DIAGNOSIS — N6310 Unspecified lump in the right breast, unspecified quadrant: Secondary | ICD-10-CM | POA: Insufficient documentation

## 2022-04-26 NOTE — Assessment & Plan Note (Addendum)
Newly noted tender lump to lateral right breast.  Reassuring mammo 08/2021. However given new finding, will refer to R diagnostic mammo and [place].  Will be in touch with results.

## 2022-04-26 NOTE — Progress Notes (Signed)
Patient ID: Kristin Kramer, female    DOB: 1941-01-05, 82 y.o.   MRN: [id number]  This visit was conducted in person.  BP 118/70   Pulse 77   Temp (!) 97.2 F (36.2 C) (Temporal)   Ht 5' 4.5" (1.638 m)   Wt 143 lb 4 oz (65 kg)   SpO2 97%   BMI 24.21 kg/m    CC: check breast mass Subjective:   HPI: Kristin Kramer is a 82 y.o. female presenting on 04/26/2022 for Breast Mass (C/o small not knot in L breast. Noticed about 1 wk ago. Denies any pain/tenderness. )   1 wk h/o knot to L breast, without discomfort or tenderness. Very itchy right breast, noticed small lump to inferior lateral R breast. H/o breast implants that are getting firmer. Denies bug bite, or inciting trauma/injury to R breast.   [facility]   Notes ongoing intermittent stomach upset described as abdominal twisting/gnawing discomfort associated with gas/bloating - attributes to food intolerances - bananas, cabbage, greens, beans, dairy products (drinks lactaid). Some lactose intolerance.  She continues probiotic daily.      Relevant past medical, surgical, family and social history reviewed and updated as indicated. Interim medical history since our last visit reviewed. Allergies and medications reviewed and updated. Outpatient Medications Prior to Visit  Medication Sig Dispense Refill   aspirin EC 81 MG tablet Take 81 mg by mouth daily. Swallow whole.     busPIRone (BUSPAR) 5 MG tablet Take 1 tablet (5 mg total) by mouth 2 (two) times daily as needed (anxiety). 180 tablet 3   calcium carbonate (OSCAL) 1500 (600 Ca) MG TABS tablet Take 600 mg of elemental calcium by mouth daily with breakfast.     Cholecalciferol (VITAMIN D-3) 5000 units TABS Take 5,000 Units by mouth daily.      Coenzyme Q10 (COQ10 PO) Take 1 capsule by mouth daily.      cyanocobalamin (VITAMIN B12) 1000 MCG tablet Take 1 tablet (1,000 mcg total) by mouth every Monday, Wednesday, and Friday.      ezetimibe (ZETIA) 10 MG tablet Take 1 tablet (10 mg total) by mouth daily. 90 tablet 3   famotidine (PEPCID) 20 MG tablet Take 20 mg by mouth daily as needed for heartburn or indigestion.     fexofenadine (ALLEGRA ALLERGY) 180 MG tablet Take 1 tablet (180 mg total) by mouth daily.     hydrochlorothiazide (MICROZIDE) 12.5 MG capsule Take 1 capsule (12.5 mg total) by mouth daily. 90 capsule 3   levothyroxine (SYNTHROID) 50 MCG tablet Take 1 tablet (50 mcg total) by mouth daily before breakfast. 90 tablet 3   losartan (COZAAR) 25 MG tablet Take 1 tablet (25 mg total) by mouth daily. 90 tablet 3   Magnesium 250 MG TABS Take 1 tablet (250 mg total) by mouth daily.  0   METAMUCIL FIBER PO Take by mouth daily.     niacinamide 500 MG tablet Take 1 tablet (500 mg total) by mouth daily.     Potassium 99 MG TABS Take 1 tablet (99 mg total) by mouth daily.     prednisoLONE acetate (PRED FORTE) 1 % ophthalmic suspension      Probiotic Product (PROBIOTIC PO) Take 1 capsule by mouth daily.      sertraline (ZOLOFT) 25 MG tablet Take 1.5 tablets (37.5 mg total) by mouth at bedtime. 135 tablet 3   TURMERIC PO Take 900 mg by mouth daily.  No facility-administered medications prior to visit.     Per HPI unless specifically indicated in ROS section below Review of Systems  Objective:  BP 118/70   Pulse 77   Temp (!) 97.2 F (36.2 C) (Temporal)   Ht 5' 4.5" (1.638 m)   Wt 143 lb 4 oz (65 kg)   SpO2 97%   BMI 24.21 kg/m   Wt Readings from Last 3 Encounters:  04/26/22 143 lb 4 oz (65 kg)  03/28/22 142 lb (64.4 kg)  12/22/21 146 lb (66.2 kg)      Physical Exam Vitals and nursing note reviewed.  Constitutional:      Appearance: Normal appearance. She is not ill-appearing.  Chest:     Chest wall: No mass.  Breasts:    Right: Mass and tenderness present. No swelling, bleeding, nipple discharge or skin change.     Left: Normal. No swelling, bleeding, mass, nipple discharge, skin change or  tenderness.       Comments:  Bilateral breast implants Tender nodular lump to right lateral breast at 9 o clock position Lymphadenopathy:     Upper Body:     Right upper body: No supraclavicular, axillary or pectoral adenopathy.     Left upper body: No supraclavicular, axillary or pectoral adenopathy.  Neurological:     Mental Status: She is alert.       Results for orders placed or performed in visit on 03/28/22  Comprehensive metabolic panel  Result Value Ref Range   Sodium 139 135 - 145 mEq/L   Potassium 3.7 3.5 - 5.1 mEq/L   Chloride 99 96 - 112 mEq/L   CO2 29 19 - 32 mEq/L   Glucose, Bld 96 70 - 99 mg/dL   BUN 28 (H) 6 - 23 mg/dL   Creatinine, Ser 0.87 0.40 - 1.20 mg/dL   Total Bilirubin 0.6 0.2 - 1.2 mg/dL   Alkaline Phosphatase 96 39 - 117 U/L   AST 29 0 - 37 U/L   ALT 35 0 - 35 U/L   Total Protein 7.5 6.0 - 8.3 g/dL   Albumin 4.7 3.5 - 5.2 g/dL   GFR 62.61 >60.00 mL/min   Calcium 9.9 8.4 - 10.5 mg/dL  TSH  Result Value Ref Range   TSH 1.41 0.35 - 5.50 uIU/mL  CBC with Differential/Platelet  Result Value Ref Range   WBC 6.7 4.0 - 10.5 K/uL   RBC 4.66 3.87 - 5.11 Mil/uL   Hemoglobin 14.0 12.0 - 15.0 g/dL   HCT 41.4 36.0 - 46.0 %   MCV 88.7 78.0 - 100.0 fl   MCHC 33.8 30.0 - 36.0 g/dL   RDW 13.6 11.5 - 15.5 %   Platelets 266.0 150.0 - 400.0 K/uL   Neutrophils Relative % 69.1 43.0 - 77.0 %   Lymphocytes Relative 19.2 12.0 - 46.0 %   Monocytes Relative 9.3 3.0 - 12.0 %   Eosinophils Relative 1.7 0.0 - 5.0 %   Basophils Relative 0.7 0.0 - 3.0 %   Neutro Abs 4.6 1.4 - 7.7 K/uL   Lymphs Abs 1.3 0.7 - 4.0 K/uL   Monocytes Absolute 0.6 0.1 - 1.0 K/uL   Eosinophils Absolute 0.1 0.0 - 0.7 K/uL   Basophils Absolute 0.0 0.0 - 0.1 K/uL  Ferritin  Result Value Ref Range   Ferritin 38.6 10.0 - 291.0 ng/mL  IBC panel  Result Value Ref Range   Iron 166 (H) 42 - 145 ug/dL   Transferrin 284.0 212.0 - 360.0 mg/dL  Saturation Ratios 41.8 20.0 - 50.0 %   TIBC 397.6  250.0 - 450.0 mcg/dL  Vitamin B12  Result Value Ref Range   Vitamin B-12 283 211 - 911 pg/mL  Lipase  Result Value Ref Range   Lipase 43.0 11.0 - 59.0 U/L  Hemoglobin A1c  Result Value Ref Range   Hgb A1c MFr Bld 5.9 4.6 - 6.5 %    Assessment & Plan:   Problem List Items Addressed This Visit     Unintended weight loss    Recent workup reassuring. Now maintaining weight. Will continue to monitor.       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  Discussed this. Continue probiotic daily, avoid gas producing foods, avoid dairy products, discussed lactase enzyme use.  Discussed trial of low-FODMAP diet, handout provided.  Update if not improving with this.       Lump of right breast - Primary    Newly noted tender lump to lateral right breast.  Reassuring mammo 08/2021. However given new finding, will refer to R diagnostic mammo and [place].  Will be in touch with results.       Relevant Orders   US BREAST COMPLETE UNI RIGHT INC AXILLA   MM Digital Diagnostic Unilat R     No orders of the defined types were placed in this encounter.   Orders Placed This Encounter  Procedures   US BREAST COMPLETE UNI RIGHT INC AXILLA    Standing Status:   Future    Standing Expiration Date:   04/27/2023    Order Specific Question:   Reason for Exam (SYMPTOM  OR DIAGNOSIS REQUIRED)    Answer:   R lateral tender breast lump    Order Specific Question:   Preferred imaging location?    Answer:   [facility]   MM Digital Diagnostic Unilat R    Standing Status:   Future    Standing Expiration Date:   04/27/2023    Order Specific Question:   Reason for Exam (SYMPTOM  OR DIAGNOSIS REQUIRED)    Answer:   R lateral breast mass 9 o clock    Order Specific Question:   Preferred imaging location?    Answer:   [facility]    Patient Instructions  Consider lactase enzyme.  Continue probiotic.  ?irritable bowel syndrome - look at  low-FODMAP diet handout provided  For right breast - let's check detailed right breast mammogram and ultrasound at breast center. You may call to schedule at your convenience: [place] [phone].   Follow up plan: Return if symptoms worsen or fail to improve.  [name], MD

## 2022-04-26 NOTE — Assessment & Plan Note (Signed)
Recent workup reassuring. Now maintaining weight. Will continue to monitor.

## 2022-04-26 NOTE — Assessment & Plan Note (Addendum)
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  Discussed this. Continue probiotic daily, avoid gas producing foods, avoid dairy products, discussed lactase enzyme use.  Discussed trial of low-FODMAP diet, handout provided.  Update if not improving with this.

## 2022-04-26 NOTE — Patient Instructions (Addendum)
Consider lactase enzyme.  Continue probiotic.  ?irritable bowel syndrome - look at low-FODMAP diet handout provided  For right breast - let's check detailed right breast mammogram and ultrasound at breast center. You may call to schedule at your convenience: [place] [phone].

## 2022-05-12 ENCOUNTER — Ambulatory Visit
Admission: RE | Admit: 2022-05-12 | Discharge: 2022-05-12 | Disposition: A | Discharge status: Home / Self Care | Payer: Medicare Other | Source: Ambulatory Visit | Attending: Family Medicine | Admitting: Family Medicine

## 2022-05-12 DIAGNOSIS — N644 Mastodynia: Secondary | ICD-10-CM | POA: Diagnosis not present

## 2022-05-12 DIAGNOSIS — N6311 Unspecified lump in the right breast, upper outer quadrant: Secondary | ICD-10-CM

## 2022-05-31 ENCOUNTER — Other Ambulatory Visit: Payer: Self-pay | Admitting: Family Medicine

## 2022-06-03 ENCOUNTER — Telehealth: Payer: Self-pay | Admitting: Cardiovascular Disease

## 2022-06-03 NOTE — Telephone Encounter (Signed)
Needs appointment to continue getting refills through our office or may get from PCP if patient does not need to see cardiologist. Thank you!

## 2022-06-03 NOTE — Telephone Encounter (Signed)
*  STAT* If patient is at the pharmacy, call can be transferred to refill team.   1. Which medications need to be refilled? (please list name of each medication and dose if known)   losartan (COZAAR) 25 MG tablet     2. Which pharmacy/location (including street and city if local pharmacy) is medication to be sent to? [id number] [address], [place]   3. Do they need a 30 day or 90 day supply? 90 day

## 2022-06-07 ENCOUNTER — Other Ambulatory Visit: Payer: Self-pay | Admitting: Cardiovascular Disease

## 2022-06-07 MED ORDER — LOSARTAN POTASSIUM 25 MG PO TABS: 25.0000 mg | ORAL_TABLET | Freq: Every day | ORAL | 3 refills | Status: DC

## 2022-06-30 ENCOUNTER — Encounter: Payer: Self-pay | Admitting: Family Medicine

## 2022-06-30 ENCOUNTER — Ambulatory Visit (INDEPENDENT_AMBULATORY_CARE_PROVIDER_SITE_OTHER): Payer: Medicare Other | Admitting: Family Medicine

## 2022-06-30 VITALS — BP 90/60 | HR 68 | Temp 98.1°F | Ht 64.5 in | Wt 143.1 lb

## 2022-06-30 DIAGNOSIS — R197 Diarrhea, unspecified: Secondary | ICD-10-CM | POA: Diagnosis not present

## 2022-06-30 DIAGNOSIS — R1084 Generalized abdominal pain: Secondary | ICD-10-CM

## 2022-06-30 LAB — CBC WITH DIFFERENTIAL/PLATELET
Basophils Absolute: 0 10*3/uL (ref 0.0–0.1)
Basophils Relative: 0.7 % (ref 0.0–3.0)
Eosinophils Absolute: 0.2 10*3/uL (ref 0.0–0.7)
Eosinophils Relative: 4 % (ref 0.0–5.0)
HCT: 40.8 % (ref 36.0–46.0)
Hemoglobin: 13.6 g/dL (ref 12.0–15.0)
Lymphocytes Relative: 26.5 % (ref 12.0–46.0)
Lymphs Abs: 1.4 10*3/uL (ref 0.7–4.0)
MCHC: 33.4 g/dL (ref 30.0–36.0)
MCV: 90.6 fl (ref 78.0–100.0)
Monocytes Absolute: 0.5 10*3/uL (ref 0.1–1.0)
Monocytes Relative: 10.2 % (ref 3.0–12.0)
Neutro Abs: 3.1 10*3/uL (ref 1.4–7.7)
Neutrophils Relative %: 58.6 % (ref 43.0–77.0)
Platelets: 246 10*3/uL (ref 150.0–400.0)
RBC: 4.5 Mil/uL (ref 3.87–5.11)
RDW: 13.9 % (ref 11.5–15.5)
WBC: 5.3 10*3/uL (ref 4.0–10.5)

## 2022-06-30 LAB — BASIC METABOLIC PANEL
BUN: 15 mg/dL (ref 6–23)
CO2: 30 mEq/L (ref 19–32)
Calcium: 9.9 mg/dL (ref 8.4–10.5)
Chloride: 101 mEq/L (ref 96–112)
Creatinine, Ser: 0.8 mg/dL (ref 0.40–1.20)
GFR: 69.12 mL/min (ref >60.00)
Glucose, Bld: 98 mg/dL (ref 70–99)
Potassium: 4.3 mEq/L (ref 3.5–5.1)
Sodium: 141 mEq/L (ref 135–145)

## 2022-06-30 NOTE — Assessment & Plan Note (Signed)
Recently undergoing workup for generalized intermittent abdominal pain.  Abdominal CT reviewed.  PCP was thinking IBS diarrhea predominant less likely.  That could be certain cause of symptoms today.  No clear cause for repeat imaging.  Will evaluate with labs including CBC and basic metabolic panel.

## 2022-06-30 NOTE — Patient Instructions (Signed)
Push fluid and gatorade. Return to regular diet as soon as able.   Once feeling better.... consider fiber , probiotic, avoid grease.

## 2022-06-30 NOTE — Assessment & Plan Note (Signed)
Acute, possibly infectious diarrhea associated with viral gastroenteritis versus foodborne toxin.  Also IBS-D flare following grease a consideration. Will evaluate with labs.  No red flags. Discussed avoidance of dehydration.  Blood pressure is slightly low today but she is asymptomatic.  Encouraged her to push fluids  and return to regular diet.  ER return precautions provided.

## 2022-06-30 NOTE — Progress Notes (Signed)
Patient ID: Kristin Kramer, female    DOB: 1940-09-09, 82 y.o.   MRN: [id number]  This visit was conducted in person.  BP 90/60   Pulse 68   Temp 98.1 F (36.7 C) (Temporal)   Ht 5' 4.5" (1.638 m)   Wt 143 lb 2 oz (64.9 kg)   SpO2 99%   BMI 24.19 kg/m    CC:  Chief Complaint  Patient presents with   Diarrhea    Started on Saturday-Was at beach with friends   Abdominal Pain    Tuesday night    Subjective:   HPI: Kristin Kramer is a 82 y.o. female  pt of Dr.G with history of hypothyroid,  GERD, MDD presenting on 06/30/2022 for Diarrhea (Started on Saturday-Was at beach with friends) and Abdominal Pain (Tuesday night)   New onset diarrhea 6 days ago.. started after fried grouper bites, steak... woke in middle of night... had very water loose stool, incontinence.  Continued frequent stools for 4 hours.  No abdominal pain.  No issues next day but return somewhat watery stool the next night... then resolved  No blood in stool.  No fever.  2 nights ago started with abdominal cramping/pain generalized.  Now no BM yesterday or today.   Currently she feels some slight LLQ pain.. uncomfortable.    HX consistent IBS diarrhea predominant..  CT abd pelvis 04/2022: no acute findings.   She has  had decreased po intake... has gotten 2 x 10 oz cups of coffee,  8 oz of water, occ another.  She is feeling better today , improved energy.  Wt Readings from Last 3 Encounters:  06/30/22 143 lb 2 oz (64.9 kg)  04/26/22 143 lb 4 oz (65 kg)  03/28/22 142 lb (64.4 kg)    BP Readings from Last 3 Encounters:  06/30/22 90/60  04/26/22 118/70  03/28/22 124/82    Relevant past medical, surgical, family and social history reviewed and updated as indicated. Interim medical history since our last visit reviewed. Allergies and medications reviewed and updated. Outpatient Medications Prior to Visit  Medication Sig Dispense Refill   aspirin EC 81 MG tablet Take 81 mg by mouth daily.  Swallow whole.     busPIRone (BUSPAR) 5 MG tablet Take 1 tablet (5 mg total) by mouth 2 (two) times daily as needed (anxiety). 180 tablet 3   calcium carbonate (OSCAL) 1500 (600 Ca) MG TABS tablet Take 600 mg of elemental calcium by mouth daily with breakfast.     Cholecalciferol (VITAMIN D-3) 5000 units TABS Take 5,000 Units by mouth daily.      Coenzyme Q10 (COQ10 PO) Take 1 capsule by mouth daily.      cyanocobalamin (VITAMIN B12) 1000 MCG tablet Take 1 tablet (1,000 mcg total) by mouth every Monday, Wednesday, and Friday.     ezetimibe (ZETIA) 10 MG tablet Take 1 tablet (10 mg total) by mouth daily. 90 tablet 3   famotidine (PEPCID) 20 MG tablet Take 20 mg by mouth daily as needed for heartburn or indigestion.     fexofenadine (ALLEGRA ALLERGY) 180 MG tablet Take 1 tablet (180 mg total) by mouth daily.     levothyroxine (SYNTHROID) 50 MCG tablet Take 1 tablet (50 mcg total) by mouth daily before breakfast. 90 tablet 3   losartan (COZAAR) 25 MG tablet Take 1 tablet (25 mg total) by mouth daily. 90 tablet 3   Magnesium 250 MG TABS Take 1 tablet (250 mg total) by mouth daily.  0   METAMUCIL FIBER PO Take by mouth daily.     niacinamide 500 MG tablet Take 1 tablet (500 mg total) by mouth daily.     Potassium 99 MG TABS Take 1 tablet (99 mg total) by mouth daily.     prednisoLONE acetate (PRED FORTE) 1 % ophthalmic suspension      Probiotic Product (PROBIOTIC PO) Take 1 capsule by mouth daily.      sertraline (ZOLOFT) 25 MG tablet Take 1.5 tablets (37.5 mg total) by mouth at bedtime. 135 tablet 3   TURMERIC PO Take 900 mg by mouth daily.      hydrochlorothiazide (MICROZIDE) 12.5 MG capsule Take 1 capsule (12.5 mg total) by mouth daily. 90 capsule 3   No facility-administered medications prior to visit.     Per HPI unless specifically indicated in ROS section below Review of Systems  Constitutional:  Negative for fatigue and fever.  HENT:  Negative for congestion.   Eyes:  Negative for  pain.  Respiratory:  Negative for cough and shortness of breath.   Cardiovascular:  Negative for chest pain, palpitations and leg swelling.  Gastrointestinal:  Negative for abdominal pain.  Genitourinary:  Negative for dysuria and vaginal bleeding.  Musculoskeletal:  Negative for back pain.  Neurological:  Negative for syncope, light-headedness and headaches.  Psychiatric/Behavioral:  Negative for dysphoric mood.    Objective:  BP 90/60   Pulse 68   Temp 98.1 F (36.7 C) (Temporal)   Ht 5' 4.5" (1.638 m)   Wt 143 lb 2 oz (64.9 kg)   SpO2 99%   BMI 24.19 kg/m   Wt Readings from Last 3 Encounters:  06/30/22 143 lb 2 oz (64.9 kg)  04/26/22 143 lb 4 oz (65 kg)  03/28/22 142 lb (64.4 kg)      Physical Exam Constitutional:      General: She is not in acute distress.    Appearance: Normal appearance. She is well-developed. She is not ill-appearing or toxic-appearing.  HENT:     Head: Normocephalic.     Right Ear: Hearing, tympanic membrane, ear canal and external ear normal. Tympanic membrane is not erythematous, retracted or bulging.     Left Ear: Hearing, tympanic membrane, ear canal and external ear normal. Tympanic membrane is not erythematous, retracted or bulging.     Nose: No mucosal edema or rhinorrhea.     Right Sinus: No maxillary sinus tenderness or frontal sinus tenderness.     Left Sinus: No maxillary sinus tenderness or frontal sinus tenderness.     Mouth/Throat:     Pharynx: Uvula midline.  Eyes:     General: Lids are normal. Lids are everted, no foreign bodies appreciated.     Conjunctiva/sclera: Conjunctivae normal.     Pupils: Pupils are equal, round, and reactive to light.  Neck:     Thyroid: No thyroid mass or thyromegaly.     Vascular: No carotid bruit.     Trachea: Trachea normal.  Cardiovascular:     Rate and Rhythm: Normal rate and regular rhythm.     Pulses: Normal pulses.     Heart sounds: Normal heart sounds, S1 normal and S2 normal. No murmur  heard.    No friction rub. No gallop.  Pulmonary:     Effort: Pulmonary effort is normal. No tachypnea or respiratory distress.     Breath sounds: Normal breath sounds. No decreased breath sounds, wheezing, rhonchi or rales.  Abdominal:     General: Bowel sounds are  normal.     Palpations: Abdomen is soft.     Tenderness: There is abdominal tenderness in the right lower quadrant and left lower quadrant. There is no right CVA tenderness, left CVA tenderness, guarding or rebound.     Comments:  Mild 1/10 pain in lower abdomen  Musculoskeletal:     Cervical back: Normal range of motion and neck supple.  Skin:    General: Skin is warm and dry.     Findings: No rash.  Neurological:     Mental Status: She is alert.  Psychiatric:        Mood and Affect: Mood is not anxious or depressed.        Speech: Speech normal.        Behavior: Behavior normal. Behavior is cooperative.        Thought Content: Thought content normal.        Judgment: Judgment normal.       Results for orders placed or performed in visit on 03/28/22  Comprehensive metabolic panel  Result Value Ref Range   Sodium 139 135 - 145 mEq/L   Potassium 3.7 3.5 - 5.1 mEq/L   Chloride 99 96 - 112 mEq/L   CO2 29 19 - 32 mEq/L   Glucose, Bld 96 70 - 99 mg/dL   BUN 28 (H) 6 - 23 mg/dL   Creatinine, Ser 1.61 0.40 - 1.20 mg/dL   Total Bilirubin 0.6 0.2 - 1.2 mg/dL   Alkaline Phosphatase 96 39 - 117 U/L   AST 29 0 - 37 U/L   ALT 35 0 - 35 U/L   Total Protein 7.5 6.0 - 8.3 g/dL   Albumin 4.7 3.5 - 5.2 g/dL   GFR 09.60 >45.40 mL/min   Calcium 9.9 8.4 - 10.5 mg/dL  TSH  Result Value Ref Range   TSH 1.41 0.35 - 5.50 uIU/mL  CBC with Differential/Platelet  Result Value Ref Range   WBC 6.7 4.0 - 10.5 K/uL   RBC 4.66 3.87 - 5.11 Mil/uL   Hemoglobin 14.0 12.0 - 15.0 g/dL   HCT 98.1 19.1 - 47.8 %   MCV 88.7 78.0 - 100.0 fl   MCHC 33.8 30.0 - 36.0 g/dL   RDW 29.5 62.1 - 30.8 %   Platelets 266.0 150.0 - 400.0 K/uL    Neutrophils Relative % 69.1 43.0 - 77.0 %   Lymphocytes Relative 19.2 12.0 - 46.0 %   Monocytes Relative 9.3 3.0 - 12.0 %   Eosinophils Relative 1.7 0.0 - 5.0 %   Basophils Relative 0.7 0.0 - 3.0 %   Neutro Abs 4.6 1.4 - 7.7 K/uL   Lymphs Abs 1.3 0.7 - 4.0 K/uL   Monocytes Absolute 0.6 0.1 - 1.0 K/uL   Eosinophils Absolute 0.1 0.0 - 0.7 K/uL   Basophils Absolute 0.0 0.0 - 0.1 K/uL  Ferritin  Result Value Ref Range   Ferritin 38.6 10.0 - 291.0 ng/mL  IBC panel  Result Value Ref Range   Iron 166 (H) 42 - 145 ug/dL   Transferrin 657.8 469.6 - 360.0 mg/dL   Saturation Ratios 29.5 20.0 - 50.0 %   TIBC 397.6 250.0 - 450.0 mcg/dL  Vitamin M84  Result Value Ref Range   Vitamin B-12 283 211 - 911 pg/mL  Lipase  Result Value Ref Range   Lipase 43.0 11.0 - 59.0 U/L  Hemoglobin A1c  Result Value Ref Range   Hgb A1c MFr Bld 5.9 4.6 - 6.5 %    Assessment and  Plan  Acute diarrhea Assessment & Plan: Acute, possibly infectious diarrhea associated with viral gastroenteritis versus foodborne toxin.  Also IBS-D flare following grease a consideration. Will evaluate with labs.  No red flags. Discussed avoidance of dehydration.  Blood pressure is slightly low today but she is asymptomatic.  Encouraged her to push fluids  and return to regular diet.  ER return precautions provided.  Orders: -     CBC with Differential/Platelet -     Basic metabolic panel  Generalized abdominal pain Assessment & Plan: Recently undergoing workup for generalized intermittent abdominal pain.  Abdominal CT reviewed.  PCP was thinking IBS diarrhea predominant less likely.  That could be certain cause of symptoms today.  No clear cause for repeat imaging.  Will evaluate with labs including CBC and basic metabolic panel.     No follow-ups on file.   Kerby Nora, MD

## 2022-07-01 ENCOUNTER — Ambulatory Visit: Payer: Medicare Other | Admitting: Family Medicine

## 2022-07-29 DIAGNOSIS — Z9842 Cataract extraction status, left eye: Secondary | ICD-10-CM | POA: Diagnosis not present

## 2022-07-29 DIAGNOSIS — H52223 Regular astigmatism, bilateral: Secondary | ICD-10-CM | POA: Diagnosis not present

## 2022-07-29 DIAGNOSIS — H04123 Dry eye syndrome of bilateral lacrimal glands: Secondary | ICD-10-CM | POA: Diagnosis not present

## 2022-07-29 DIAGNOSIS — Z9841 Cataract extraction status, right eye: Secondary | ICD-10-CM | POA: Diagnosis not present

## 2022-08-02 ENCOUNTER — Other Ambulatory Visit: Payer: Self-pay | Admitting: Family Medicine

## 2022-08-02 DIAGNOSIS — Z1231 Encounter for screening mammogram for malignant neoplasm of breast: Secondary | ICD-10-CM

## 2022-08-06 ENCOUNTER — Other Ambulatory Visit: Payer: Self-pay | Admitting: Family Medicine

## 2022-08-10 ENCOUNTER — Other Ambulatory Visit: Payer: Self-pay

## 2022-08-10 MED ORDER — HYDROCHLOROTHIAZIDE 12.5 MG PO CAPS: 12.5000 mg | ORAL_CAPSULE | Freq: Every day | ORAL | 0 refills | Status: DC

## 2022-08-18 DIAGNOSIS — D0472 Carcinoma in situ of skin of left lower limb, including hip: Secondary | ICD-10-CM | POA: Diagnosis not present

## 2022-08-18 DIAGNOSIS — L570 Actinic keratosis: Secondary | ICD-10-CM | POA: Diagnosis not present

## 2022-09-03 ENCOUNTER — Other Ambulatory Visit: Payer: Self-pay | Admitting: Family Medicine

## 2022-09-03 DIAGNOSIS — E538 Deficiency of other specified B group vitamins: Secondary | ICD-10-CM

## 2022-09-03 DIAGNOSIS — M85859 Other specified disorders of bone density and structure, unspecified thigh: Secondary | ICD-10-CM

## 2022-09-03 DIAGNOSIS — E039 Hypothyroidism, unspecified: Secondary | ICD-10-CM

## 2022-09-03 DIAGNOSIS — E559 Vitamin D deficiency, unspecified: Secondary | ICD-10-CM

## 2022-09-03 DIAGNOSIS — R7303 Prediabetes: Secondary | ICD-10-CM

## 2022-09-03 DIAGNOSIS — E785 Hyperlipidemia, unspecified: Secondary | ICD-10-CM

## 2022-09-03 HISTORY — DX: Deficiency of other specified B group vitamins: E53.8

## 2022-09-04 ENCOUNTER — Other Ambulatory Visit: Payer: Self-pay | Admitting: Cardiovascular Disease

## 2022-09-05 ENCOUNTER — Ambulatory Visit
Admission: RE | Admit: 2022-09-05 | Discharge: 2022-09-05 | Disposition: A | Discharge status: Home / Self Care | Payer: Medicare Other | Source: Ambulatory Visit | Attending: Family Medicine | Admitting: Family Medicine

## 2022-09-05 DIAGNOSIS — Z1231 Encounter for screening mammogram for malignant neoplasm of breast: Secondary | ICD-10-CM | POA: Diagnosis not present

## 2022-09-06 ENCOUNTER — Other Ambulatory Visit (INDEPENDENT_AMBULATORY_CARE_PROVIDER_SITE_OTHER): Payer: Medicare Other

## 2022-09-06 DIAGNOSIS — R7303 Prediabetes: Secondary | ICD-10-CM

## 2022-09-06 DIAGNOSIS — M85859 Other specified disorders of bone density and structure, unspecified thigh: Secondary | ICD-10-CM

## 2022-09-06 DIAGNOSIS — E559 Vitamin D deficiency, unspecified: Secondary | ICD-10-CM

## 2022-09-06 DIAGNOSIS — E039 Hypothyroidism, unspecified: Secondary | ICD-10-CM

## 2022-09-06 DIAGNOSIS — E538 Deficiency of other specified B group vitamins: Secondary | ICD-10-CM

## 2022-09-06 DIAGNOSIS — E785 Hyperlipidemia, unspecified: Secondary | ICD-10-CM

## 2022-09-06 LAB — COMPREHENSIVE METABOLIC PANEL
ALT: 38 U/L — ABNORMAL HIGH (ref 0–35)
AST: 34 U/L (ref 0–37)
Albumin: 4.5 g/dL (ref 3.5–5.2)
Alkaline Phosphatase: 96 U/L (ref 39–117)
BUN: 28 mg/dL — ABNORMAL HIGH (ref 6–23)
CO2: 30 mEq/L (ref 19–32)
Calcium: 10 mg/dL (ref 8.4–10.5)
Chloride: 102 mEq/L (ref 96–112)
Creatinine, Ser: 0.81 mg/dL (ref 0.40–1.20)
GFR: 68.01 mL/min (ref >60.00)
Glucose, Bld: 101 mg/dL — ABNORMAL HIGH (ref 70–99)
Potassium: 4.2 mEq/L (ref 3.5–5.1)
Sodium: 141 mEq/L (ref 135–145)
Total Bilirubin: 0.6 mg/dL (ref 0.2–1.2)
Total Protein: 7.1 g/dL (ref 6.0–8.3)

## 2022-09-06 LAB — LIPID PANEL
Cholesterol: 209 mg/dL — ABNORMAL HIGH (ref 0–200)
HDL: 60.8 mg/dL (ref >39.00)
LDL Cholesterol: 127 mg/dL — ABNORMAL HIGH (ref 0–99)
NonHDL: 148.67
Total CHOL/HDL Ratio: 3
Triglycerides: 106 mg/dL (ref 0.0–149.0)
VLDL: 21.2 mg/dL (ref 0.0–40.0)

## 2022-09-06 LAB — VITAMIN D 25 HYDROXY (VIT D DEFICIENCY, FRACTURES): VITD: 63.62 ng/mL (ref 30.00–100.00)

## 2022-09-06 LAB — CBC WITH DIFFERENTIAL/PLATELET
Basophils Absolute: 0 10*3/uL (ref 0.0–0.1)
Basophils Relative: 0.9 % (ref 0.0–3.0)
Eosinophils Absolute: 0.3 10*3/uL (ref 0.0–0.7)
Eosinophils Relative: 5 % (ref 0.0–5.0)
HCT: 38.9 % (ref 36.0–46.0)
Hemoglobin: 12.8 g/dL (ref 12.0–15.0)
Lymphocytes Relative: 30.5 % (ref 12.0–46.0)
Lymphs Abs: 1.6 10*3/uL (ref 0.7–4.0)
MCHC: 33 g/dL (ref 30.0–36.0)
MCV: 89.3 fl (ref 78.0–100.0)
Monocytes Absolute: 0.7 10*3/uL (ref 0.1–1.0)
Monocytes Relative: 12.6 % — ABNORMAL HIGH (ref 3.0–12.0)
Neutro Abs: 2.6 10*3/uL (ref 1.4–7.7)
Neutrophils Relative %: 51 % (ref 43.0–77.0)
Platelets: 220 10*3/uL (ref 150.0–400.0)
RBC: 4.35 Mil/uL (ref 3.87–5.11)
RDW: 12.7 % (ref 11.5–15.5)
WBC: 5.2 10*3/uL (ref 4.0–10.5)

## 2022-09-06 LAB — TSH: TSH: 4.91 u[IU]/mL (ref 0.35–5.50)

## 2022-09-06 LAB — VITAMIN B12: Vitamin B-12: 539 pg/mL (ref 211–911)

## 2022-09-06 LAB — HEMOGLOBIN A1C: Hgb A1c MFr Bld: 5.8 % (ref 4.6–6.5)

## 2022-09-08 ENCOUNTER — Other Ambulatory Visit: Payer: Self-pay | Admitting: Cardiovascular Disease

## 2022-09-09 ENCOUNTER — Other Ambulatory Visit: Payer: Self-pay | Admitting: Family Medicine

## 2022-09-09 DIAGNOSIS — F330 Major depressive disorder, recurrent, mild: Secondary | ICD-10-CM

## 2022-09-09 DIAGNOSIS — E039 Hypothyroidism, unspecified: Secondary | ICD-10-CM

## 2022-09-09 NOTE — Telephone Encounter (Signed)
Last visit 05/2021 plan of follow up appointment as needed

## 2022-09-13 ENCOUNTER — Ambulatory Visit (INDEPENDENT_AMBULATORY_CARE_PROVIDER_SITE_OTHER): Payer: Medicare Other | Admitting: Family Medicine

## 2022-09-13 ENCOUNTER — Encounter: Payer: Self-pay | Admitting: Family Medicine

## 2022-09-13 VITALS — BP 126/72 | HR 80 | Temp 97.4°F | Ht 64.5 in | Wt 141.4 lb

## 2022-09-13 DIAGNOSIS — H9192 Unspecified hearing loss, left ear: Secondary | ICD-10-CM

## 2022-09-13 DIAGNOSIS — I83813 Varicose veins of bilateral lower extremities with pain: Secondary | ICD-10-CM

## 2022-09-13 DIAGNOSIS — H9190 Unspecified hearing loss, unspecified ear: Secondary | ICD-10-CM

## 2022-09-13 DIAGNOSIS — Z7189 Other specified counseling: Secondary | ICD-10-CM

## 2022-09-13 DIAGNOSIS — Z0000 Encounter for general adult medical examination without abnormal findings: Secondary | ICD-10-CM

## 2022-09-13 DIAGNOSIS — M1811 Unilateral primary osteoarthritis of first carpometacarpal joint, right hand: Secondary | ICD-10-CM

## 2022-09-13 DIAGNOSIS — E739 Lactose intolerance, unspecified: Secondary | ICD-10-CM

## 2022-09-13 DIAGNOSIS — F330 Major depressive disorder, recurrent, mild: Secondary | ICD-10-CM

## 2022-09-13 DIAGNOSIS — E039 Hypothyroidism, unspecified: Secondary | ICD-10-CM

## 2022-09-13 DIAGNOSIS — Z531 Procedure and treatment not carried out because of patient's decision for reasons of belief and group pressure: Secondary | ICD-10-CM

## 2022-09-13 DIAGNOSIS — E559 Vitamin D deficiency, unspecified: Secondary | ICD-10-CM

## 2022-09-13 DIAGNOSIS — K219 Gastro-esophageal reflux disease without esophagitis: Secondary | ICD-10-CM

## 2022-09-13 DIAGNOSIS — G3184 Mild cognitive impairment, so stated: Secondary | ICD-10-CM

## 2022-09-13 DIAGNOSIS — E538 Deficiency of other specified B group vitamins: Secondary | ICD-10-CM

## 2022-09-13 DIAGNOSIS — F411 Generalized anxiety disorder: Secondary | ICD-10-CM

## 2022-09-13 DIAGNOSIS — E785 Hyperlipidemia, unspecified: Secondary | ICD-10-CM

## 2022-09-13 DIAGNOSIS — M85859 Other specified disorders of bone density and structure, unspecified thigh: Secondary | ICD-10-CM

## 2022-09-13 DIAGNOSIS — I700 Atherosclerosis of aorta: Secondary | ICD-10-CM

## 2022-09-13 DIAGNOSIS — R7303 Prediabetes: Secondary | ICD-10-CM

## 2022-09-13 DIAGNOSIS — N393 Stress incontinence (female) (male): Secondary | ICD-10-CM

## 2022-09-13 HISTORY — DX: Unspecified hearing loss, unspecified ear: H91.90

## 2022-09-13 MED ORDER — CITRUCEL PO POWD: 1.0000 | Freq: Every day | ORAL | Status: AC | PRN

## 2022-09-13 MED ORDER — BUSPIRONE HCL 5 MG PO TABS: 5.0000 mg | ORAL_TABLET | Freq: Two times a day (BID) | ORAL | 4 refills | Status: DC

## 2022-09-13 MED ORDER — SERTRALINE HCL 25 MG PO TABS: 37.5000 mg | ORAL_TABLET | Freq: Every day | ORAL | 4 refills | Status: DC

## 2022-09-13 MED ORDER — LEVOTHYROXINE SODIUM 50 MCG PO TABS
50.0000 ug | ORAL_TABLET | Freq: Every day | ORAL | 4 refills | Status: DC
Start: 2022-09-13 — End: 2023-09-15

## 2022-09-13 MED ORDER — HYDROCHLOROTHIAZIDE 12.5 MG PO CAPS: 12.5000 mg | ORAL_CAPSULE | Freq: Every day | ORAL | 4 refills | Status: DC

## 2022-09-13 MED ORDER — EZETIMIBE 10 MG PO TABS: 10.0000 mg | ORAL_TABLET | Freq: Every day | ORAL | 4 refills | Status: DC

## 2022-09-13 MED ORDER — BUSPIRONE HCL 5 MG PO TABS: 5.0000 mg | ORAL_TABLET | Freq: Two times a day (BID) | ORAL | 3 refills | Status: DC | PRN

## 2022-09-13 NOTE — Assessment & Plan Note (Signed)
Stable period on pepcid.

## 2022-09-13 NOTE — Assessment & Plan Note (Signed)
Preventative protocols reviewed and updated unless pt declined. Discussed healthy diet and lifestyle.  

## 2022-09-13 NOTE — Assessment & Plan Note (Signed)
Stable period.  

## 2022-09-13 NOTE — Assessment & Plan Note (Signed)
L>R No wax in ears. Refer to audiology for formal evaluation.

## 2022-09-13 NOTE — Assessment & Plan Note (Signed)
Chronic, stable period on zetia and niacinamide (through derm). Statin intolerance. The ASCVD Risk score (Arnett DK, et al., 2019) failed to calculate for the following reasons:   The 2019 ASCVD risk score is only valid for ages 22 to 49

## 2022-09-13 NOTE — Assessment & Plan Note (Signed)
Stable period on low dose sertraline and buspar as per below. Agrees to trial lower sertraline 25mg  daily.

## 2022-09-13 NOTE — Assessment & Plan Note (Signed)
Chronic, stable period on sertraline + low dose buspar 5mg  BID - she notes significant benefit with buspar. Continue.

## 2022-09-13 NOTE — Patient Instructions (Addendum)
We will refer you to audiologist.  Medicines refilled Good to see you today  Return in 1 year for next wellness visit/physical.

## 2022-09-13 NOTE — Assessment & Plan Note (Signed)
Stable on 5000 international units  vit d

## 2022-09-13 NOTE — Assessment & Plan Note (Signed)
Encouraged regular weight bearing exercise. She continues calcium and vit D supplementation.

## 2022-09-13 NOTE — Assessment & Plan Note (Signed)
TSH mildly elevated but stable - continue current regimen.
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
## 2022-09-13 NOTE — Assessment & Plan Note (Signed)
Continue zetia.  

## 2022-09-13 NOTE — Assessment & Plan Note (Signed)

## 2022-09-13 NOTE — Assessment & Plan Note (Signed)
Will continue prn voltaren topically.  Declines hand surgery evaluation at this time.

## 2022-09-13 NOTE — Assessment & Plan Note (Signed)
Previously discussed.

## 2022-09-13 NOTE — Assessment & Plan Note (Signed)
Continue b12 replacement MWF

## 2022-09-21 ENCOUNTER — Ambulatory Visit: Payer: Medicare Other | Attending: Family Medicine | Admitting: Audiologist

## 2022-09-21 DIAGNOSIS — H903 Sensorineural hearing loss, bilateral: Secondary | ICD-10-CM | POA: Insufficient documentation
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
## 2022-10-09 ENCOUNTER — Other Ambulatory Visit: Payer: Self-pay | Admitting: Family Medicine

## 2022-10-09 DIAGNOSIS — F330 Major depressive disorder, recurrent, mild: Secondary | ICD-10-CM

## 2022-10-10 DIAGNOSIS — H16223 Keratoconjunctivitis sicca, not specified as Sjogren's, bilateral: Secondary | ICD-10-CM | POA: Diagnosis not present

## 2022-10-10 DIAGNOSIS — H04123 Dry eye syndrome of bilateral lacrimal glands: Secondary | ICD-10-CM | POA: Diagnosis not present

## 2022-10-10 NOTE — Telephone Encounter (Signed)
Too soon. Rx sent to 09/13/22, #135/4 to [facility]  Request denied.

## 2023-03-10 ENCOUNTER — Ambulatory Visit
Admission: RE | Admit: 2023-03-10 | Discharge: 2023-03-10 | Disposition: A | Discharge status: Home / Self Care | Payer: Medicare Other | Source: Ambulatory Visit | Attending: Chiropractor | Admitting: Chiropractor

## 2023-03-10 ENCOUNTER — Other Ambulatory Visit: Payer: Self-pay | Admitting: Chiropractor

## 2023-03-10 DIAGNOSIS — R52 Pain, unspecified: Secondary | ICD-10-CM

## 2023-03-10 DIAGNOSIS — Z96649 Presence of unspecified artificial hip joint: Secondary | ICD-10-CM

## 2023-03-10 DIAGNOSIS — M79601 Pain in right arm: Secondary | ICD-10-CM | POA: Diagnosis not present

## 2023-03-10 DIAGNOSIS — S199XXA Unspecified injury of neck, initial encounter: Secondary | ICD-10-CM

## 2023-03-10 DIAGNOSIS — Z96641 Presence of right artificial hip joint: Secondary | ICD-10-CM | POA: Diagnosis not present

## 2023-03-10 DIAGNOSIS — M542 Cervicalgia: Secondary | ICD-10-CM | POA: Diagnosis not present

## 2023-03-10 DIAGNOSIS — M25551 Pain in right hip: Secondary | ICD-10-CM | POA: Diagnosis not present

## 2023-03-16 DIAGNOSIS — L578 Other skin changes due to chronic exposure to nonionizing radiation: Secondary | ICD-10-CM | POA: Diagnosis not present

## 2023-03-16 DIAGNOSIS — D2239 Melanocytic nevi of other parts of face: Secondary | ICD-10-CM | POA: Diagnosis not present

## 2023-03-16 DIAGNOSIS — I788 Other diseases of capillaries: Secondary | ICD-10-CM | POA: Diagnosis not present

## 2023-03-16 DIAGNOSIS — C44619 Basal cell carcinoma of skin of left upper limb, including shoulder: Secondary | ICD-10-CM | POA: Diagnosis not present

## 2023-03-16 DIAGNOSIS — Z85828 Personal history of other malignant neoplasm of skin: Secondary | ICD-10-CM | POA: Diagnosis not present

## 2023-03-16 DIAGNOSIS — L814 Other melanin hyperpigmentation: Secondary | ICD-10-CM | POA: Diagnosis not present

## 2023-03-16 DIAGNOSIS — D225 Melanocytic nevi of trunk: Secondary | ICD-10-CM | POA: Diagnosis not present

## 2023-03-16 DIAGNOSIS — D2262 Melanocytic nevi of left upper limb, including shoulder: Secondary | ICD-10-CM | POA: Diagnosis not present

## 2023-03-16 DIAGNOSIS — L821 Other seborrheic keratosis: Secondary | ICD-10-CM | POA: Diagnosis not present

## 2023-03-16 DIAGNOSIS — D1801 Hemangioma of skin and subcutaneous tissue: Secondary | ICD-10-CM | POA: Diagnosis not present

## 2023-03-16 DIAGNOSIS — L720 Epidermal cyst: Secondary | ICD-10-CM | POA: Diagnosis not present

## 2023-03-16 DIAGNOSIS — L738 Other specified follicular disorders: Secondary | ICD-10-CM | POA: Diagnosis not present

## 2023-03-16 DIAGNOSIS — D485 Neoplasm of uncertain behavior of skin: Secondary | ICD-10-CM | POA: Diagnosis not present

## 2023-03-16 DIAGNOSIS — L570 Actinic keratosis: Secondary | ICD-10-CM | POA: Diagnosis not present

## 2023-03-24 ENCOUNTER — Encounter: Payer: Self-pay | Admitting: Family Medicine

## 2023-03-30 DIAGNOSIS — C44729 Squamous cell carcinoma of skin of left lower limb, including hip: Secondary | ICD-10-CM | POA: Diagnosis not present

## 2023-03-30 DIAGNOSIS — C44311 Basal cell carcinoma of skin of nose: Secondary | ICD-10-CM | POA: Diagnosis not present

## 2023-03-30 DIAGNOSIS — D485 Neoplasm of uncertain behavior of skin: Secondary | ICD-10-CM | POA: Diagnosis not present

## 2023-03-30 DIAGNOSIS — L570 Actinic keratosis: Secondary | ICD-10-CM | POA: Diagnosis not present

## 2023-03-30 DIAGNOSIS — C44619 Basal cell carcinoma of skin of left upper limb, including shoulder: Secondary | ICD-10-CM | POA: Diagnosis not present

## 2023-04-05 ENCOUNTER — Encounter: Payer: Self-pay | Admitting: Family Medicine

## 2023-04-12 ENCOUNTER — Ambulatory Visit: Payer: Self-pay | Admitting: Family Medicine

## 2023-04-12 NOTE — Telephone Encounter (Signed)
 Is this the same pain she had we discussed at last visit 09/2022? (R 1st CMC thumb joint )  If so, would she be interested in referral hand surgery evaluation? If so, doesn't need OV for this just let me know.

## 2023-04-12 NOTE — Telephone Encounter (Signed)
 Copied from CRM [id number]. Topic: Clinical - Red Word Triage >> Apr 12, 2023 11:19 AM Victoria A wrote: [facility] that prompted transfer to Nurse Triage: Patient has pain in right hand 1-10; 4- but during the night it hurts really bad-has been going on for about 7 months and has been getting worse  Chief Complaint: right hand pain Symptoms: pain, difficulty writing and using hand Frequency: constant but pain varies.  Pertinent Negatives: Patient denies fever, numbness, neck pain Disposition: [] ED /[] Urgent Care (no appt availability in office) / [x] Appointment(In office/virtual)/ []  [facility]/ [] Home Care/ [] Refused Recommended Disposition /[] [facility]/ []  Follow-up with PCP Additional Notes: states this pain has been on and off for 7 months.  Apt made for 2/11.  Care advice given, denies questions, instructed to go to UC or ER if becomes worse.  Pcp office updated.   Reason for Disposition  [1] MILD pain (e.g., does not interfere with normal activities) AND [2] present > 7 days  Answer Assessment - Initial Assessment Questions 1. ONSET: When did the pain start?     7 months ago. 2. LOCATION: Where is the pain located?     Right hand in joint between thumb and wrist.  3. PAIN: How bad is the pain? (Scale 1-10; or mild, moderate, severe)   - MILD (1-3): doesn't interfere with normal activities   - MODERATE (4-7): interferes with normal activities (e.g., work or school) or awakens from sleep   - SEVERE (8-10): excruciating pain, unable to use hand at all     Throbbing 4. WORK OR EXERCISE: Has there been any recent work or exercise that involved this part (i.e., hand or wrist) of the body?     denies 5. CAUSE: What do you think is causing the pain?     unknown 6. AGGRAVATING FACTORS: What makes the pain worse? (e.g., using computer)     Hurts when she uses hand 7. OTHER SYMPTOMS: Do you have any other symptoms? (e.g., neck pain, swelling, rash,  numbness, fever)     Denies.  Protocols used: Hand and Wrist Pain-A-AH

## 2023-04-12 NOTE — Telephone Encounter (Signed)
Lvm asking pt to call back.  Need to get answer to Dr. Timoteo Expose question.

## 2023-04-13 ENCOUNTER — Telehealth: Payer: Self-pay | Admitting: Family Medicine

## 2023-04-13 DIAGNOSIS — M79641 Pain in right hand: Secondary | ICD-10-CM

## 2023-04-13 DIAGNOSIS — M1811 Unilateral primary osteoarthritis of first carpometacarpal joint, right hand: Secondary | ICD-10-CM

## 2023-04-13 NOTE — Telephone Encounter (Signed)
 Spoke pt asking about hand pain. She confirms it's same pain as discussed in 09/2022 but has significantly increased making it hard to use. Pt agrees to hand surgery referral. Notified her to expect a call to get scheduled and 04/18/23 OV will be c/x. Pt verbalizes understanding and expresses her thanks.

## 2023-04-13 NOTE — Telephone Encounter (Signed)
 Copied from CRM [id number]. Topic: General - Other >> Apr 13, 2023  8:57 AM Robinson H wrote: Reason for CRM: Patient returning call in response to message from [facility] at office and provider, see below:  Is this the same pain she had we discussed at last visit 09/2022? (R 1st CMC thumb joint ) Patient states Yes   If so, would she be interested in referral hand surgery evaluation? If so, doesn't need OV for this just let me know. Patient states Yes

## 2023-04-13 NOTE — Telephone Encounter (Signed)
 See 04/13/23 phn note.

## 2023-04-13 NOTE — Telephone Encounter (Signed)
 Duplicate message (see other 04/13/23 phn note and 04/12/23 Nurse Triage note).

## 2023-04-14 NOTE — Telephone Encounter (Signed)
 Order placed

## 2023-04-14 NOTE — Telephone Encounter (Signed)
 Noted.

## 2023-04-14 NOTE — Addendum Note (Signed)
 Addended by: Claire Crick on: 04/14/2023 07:28 AM   Modules accepted: Orders

## 2023-04-18 ENCOUNTER — Ambulatory Visit: Payer: Medicare Other | Admitting: Family Medicine

## 2023-04-24 DIAGNOSIS — M19041 Primary osteoarthritis, right hand: Secondary | ICD-10-CM | POA: Diagnosis not present

## 2023-04-24 DIAGNOSIS — M1811 Unilateral primary osteoarthritis of first carpometacarpal joint, right hand: Secondary | ICD-10-CM | POA: Diagnosis not present

## 2023-04-26 ENCOUNTER — Telehealth: Payer: Self-pay | Admitting: Cardiovascular Disease

## 2023-04-26 NOTE — Telephone Encounter (Signed)
   Pre-operative Risk Assessment    Patient Name: Kristin Kramer  DOB: May 17, 1940 MRN: [id number]   Date of last office visit: unknown Date of next office visit: unknown   Request for Surgical Clearance    Procedure:   R thumb [facility] arthroplasty with double tendon transfer and fiber lock suspension  Date of Surgery:  Clearance 05/30/23                                Surgeon:  Dr. Casandra Doffing Surgeon's Group or Practice Name:  [facility] Phone number:  [phone] Fax number:  [phone]   Type of Clearance Requested:   - Pharmacy:  Hold Aspirin follow instruction   Type of Anesthesia:  Not Indicated   Additional requests/questions:    SignedShawna Orleans   04/26/2023, 9:53 AM

## 2023-04-26 NOTE — Telephone Encounter (Signed)
Left message to call back to schedule in office appt for pre op clearance.

## 2023-04-26 NOTE — Telephone Encounter (Signed)
   Name: Kristin Kramer  DOB: 1940-11-04  MRN: [id number]  Primary Cardiologist: None  Chart reviewed as part of pre-operative protocol coverage. Because of Leeandra Ellerson Bowdish's past medical history and time since last visit, she will require a follow-up in-office visit in order to better assess preoperative cardiovascular risk.  Pre-op covering staff: - Please schedule appointment and call patient to inform them. If patient already had an upcoming appointment within acceptable timeframe, please add "pre-op clearance" to the appointment notes so provider is aware. - Please contact requesting surgeon's office via preferred method (i.e, phone, fax) to inform them of need for appointment prior to surgery.  As long as she is asymptomatic at the time of office visit, should be able to hold aspirin x 5-7 days prior to procedure.  Please restart when medically safe to do so.  Sharlene Dory, PA-C  04/26/2023, 10:28 AM

## 2023-04-27 NOTE — Telephone Encounter (Signed)
S/w the pt and she states her surgery has been moved to Jul 28, 2023. Pt has been scheduled to see Dr. Mariah Milling per pt request 07/03/23 @ 8:20 in the [place] office. I will update all parties involved. Pt thanked me for the help.

## 2023-05-04 ENCOUNTER — Ambulatory Visit: Payer: Medicare Other | Admitting: Orthopedic Surgery

## 2023-05-09 DIAGNOSIS — C44311 Basal cell carcinoma of skin of nose: Secondary | ICD-10-CM | POA: Diagnosis not present

## 2023-05-31 ENCOUNTER — Other Ambulatory Visit: Payer: Self-pay | Admitting: Cardiovascular Disease

## 2023-06-26 ENCOUNTER — Other Ambulatory Visit: Payer: Self-pay | Admitting: Cardiovascular Disease

## 2023-07-02 NOTE — Progress Notes (Unsigned)
 Cardiology Office Note  Date:  07/03/2023   ID:  Raegyn, Bonson 08-28-1940, MRN [id number]  PCP:  Claire Crick, MD   Chief Complaint  Patient presents with   Pre op clearance     Patient is scheduled Jul 28, 2023 for a R thumb [facility] arthroplasty with double tendon transfer and fiber lock suspension. Patient c/o shortness of breath with little to no exertion & very fatigue.     HPI:  Ms. Cosson is a 83 year old woman with a history of hyperlipidemia, osteoarthritis of her knees, total knee replacement 01/02/2012,  Chest pain postoperatively previously presenting for abnormal EKG and preop eval 2013, Chronic muscle cramping x 1 year Chronic SOB Mild diffuse aortic atherosclerosis on CT scan 2024 Who presents for routine follow-up of her exertional dyspnea  Last seen in clinic by myself March 2023 Chronic SOB has persisted "I am very active" Too tired recently to do exercise , no energy/tired Wakes frequently at night  Denies chest pain concerning for angina No fluid retention, no lower extremity edema, no PND orthopnea  Leaving to go to [place]  for 2 weeks  Blood pressure well-controlled  Cholesterol remains over 200 on Zetia   CT scan abdomen pulled up and reviewed, mild diffuse aortic atherosclerosis noted  EKG personally reviewed by myself on todays visit EKG Interpretation Date/Time:  Monday July 03 2023 08:35:20 EDT Ventricular Rate:  73 PR Interval:  172 QRS Duration:  90 QT Interval:  420 QTC Calculation: 462 R Axis:   -67  Text Interpretation: Normal sinus rhythm Left anterior fascicular block When compared with ECG of 08-Jul-2017 09:20, Left anterior fascicular block is now Present Confirmed by Belva Boyden [phone]) on 07/03/2023 8:57:32 AM   Other past medical history reviewed CT scan May 2019 mild diffuse aortic atherosclerosis  Chest pain postoperatively in 2013 after knee replacement surgery . Prior  echocardiogram  was normal, cardiac  enzymes were normal.  No stress test was performed.  Notes indicating in the past she did not tolerate red yeast rice, Lipitor, pravastatin . She had myalgias.   Family history  father passed away at age 27 from lung cancer,  mother at age 40 for lung cancer. They were both smokers    PMH:   has a past medical history of Abdominal pain (12/2012), Abnormal ECG, Abnormal EKG, Basal cell carcinoma (02/2021), Cervical neck pain with evidence of disc disease (08/05/2016), Chronic nasal congestion (04/19/2017), Chronic rhinitis (03/10/2020), Closed fracture of hip (07/07/2017), Closed intertrochanteric fracture with nonunion, right (11/28/2017), DJD (degenerative joint disease) of knee (03/18/2013), Elevated alkaline phosphatase level (01/03/2013), Elevated blood pressure reading without diagnosis of hypertension (03/10/2020), Exertional dyspnea (04/19/2017), 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), Generalized anxiety disorder, GERD (gastroesophageal reflux disease), Herpes zoster (02/19/2014), History of pneumonia, 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), History of UTI, HLD (hyperlipidemia), Hypothyroidism, Knee osteoarthritis (2013, 2014), Lactose intolerance (02/08/2018), Leg cramping (08/15/2018), Major depressive disorder, Mesenteric lymphadenitis (2014), Migraines, Mild cognitive impairment (12/23/2020), Osteoarthritis of knee (03/18/2013), Osteopenia (08/2016), Postoperative nausea and vomiting, Pre-diabetes, Refusal of blood transfusions as patient is Jehov[employer]'s Witness, Right rib fracture (05/21/2014), 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  PSH:    Past Surgical History:  Procedure Laterality Date   AUGMENTATION MAMMAPLASTY     BREAST ENHANCEMENT SURGERY      BUNIONECTOMY  2013   R and L foot  CATARACT EXTRACTION Bilateral 2013   R, pending L   COLONOSCOPY  2007   no records received   COLONOSCOPY  08/2014   tubular adenoma, rpt 5 yrs Sandrea Cruel)   COLONOSCOPY  06/2020   4 polyps removed, TA, no rpt recommended Sandrea Cruel)   CONVERSION TO TOTAL HIP Right 12/07/2017   Procedure: CONVERSION TO TOTAL HIP ( REMOVING SMITH AND NEPHEW NAIL);  Surgeon: Molli Angelucci, MD;  Location: [facility];  Service: Orthopedics;  Laterality: Right;   COSMETIC SURGERY  2002   face lift   dexa  06/2008   T -1.4 spine and hip   ETHMOIDECTOMY Bilateral 05/19/2016   Procedure: ETHMOIDECTOMY;  Surgeon: Rogers Clayman, MD;  Location: [facility];  Service: ENT;  Laterality: Bilateral;   EYE SURGERY     bilateral cataract surgery   FRONTAL SINUS EXPLORATION Bilateral 05/19/2016   Procedure: FRONTAL SINUS EXPLORATION;  Surgeon: Rogers Clayman, MD;  Location: [facility];  Service: ENT;  Laterality: Bilateral;   INTRAMEDULLARY (IM) NAIL INTERTROCHANTERIC Right 07/08/2017   Procedure: RIGHT INTERTROCHANTRIC IM NAIL;  Surgeon: Wes Hamman, MD;  Location: [facility];  Service: Orthopedics;  Laterality: Right;   MAXILLARY ANTROSTOMY Bilateral 05/19/2016   Procedure: MAXILLARY ANTROSTOMY;  Surgeon: Rogers Clayman, MD;  Location: [facility];  Service: ENT;  Laterality: Bilateral;   MOHS SURGERY  2011   R leg   PARTIAL HYSTERECTOMY  1980   fibroids, ovaries remained   SINUS ENDO W/FUSION N/A 05/19/2016   Procedure: ENDOSCOPIC SINUS SURGERY WITH NAVIGATION;  Surgeon: Rogers Clayman, MD;  Location: [facility];  Service: ENT;  Laterality: N/A;   SPHENOIDECTOMY Bilateral 05/19/2016   Procedure: Rupert Counts;  Surgeon: Rogers Clayman, MD;  Location: [facility];  Service: ENT;  Laterality: Bilateral;   SQUAMOUS CELL CARCINOMA EXCISION Right 07/13/2016   Dr. Avis Boehringer, [facility]   TONSILLECTOMY AND ADENOIDECTOMY  1945   TOTAL HIP REVISION Right 08/07/2019   Procedure: Right  hip acetabular versus total hip arthroplasty revision-posterior;  Surgeon: Liliane Rei, MD;  Location: [facility];  Service: Orthopedics;  Laterality: Right;    TOTAL KNEE ARTHROPLASTY  01/02/2012   Right - Surgeon: Genevie Kerns, MD   TOTAL KNEE ARTHROPLASTY Left 03/18/2013   Surgeon: Genevie Kerns, MD    Current Outpatient Medications  Medication Sig Dispense Refill   aspirin  EC 81 MG tablet Take 81 mg by mouth daily. Swallow whole.     busPIRone  (BUSPAR ) 5 MG tablet Take 1 tablet (5 mg total) by mouth 2 (two) times daily. 180 tablet 4   calcium  carbonate (OSCAL) 1500 (600 Ca) MG TABS tablet Take 600 mg of elemental calcium  by mouth daily with breakfast.     Cholecalciferol  (VITAMIN D -3) 5000 units TABS Take 5,000 Units by mouth daily.      cyanocobalamin  (VITAMIN B12) 1000 MCG tablet Take 1 tablet (1,000 mcg total) by mouth every Monday, Wednesday, and Friday.     ezetimibe  (ZETIA ) 10 MG tablet Take 1 tablet (10 mg total) by mouth daily. 90 tablet 4   famotidine  (PEPCID ) 20 MG tablet Take 20 mg by mouth daily as needed for heartburn or indigestion.     fexofenadine  (ALLEGRA  ALLERGY) 180 MG tablet Take 1 tablet (180 mg total) by mouth daily.     hydrochlorothiazide  (MICROZIDE ) 12.5 MG capsule Take 1 capsule (12.5 mg total) by mouth daily. 90 capsule 4   levothyroxine  (SYNTHROID ) 50 MCG tablet Take 1 tablet (50 mcg total) by mouth daily before breakfast. 90 tablet  4   losartan  (COZAAR ) 25 MG tablet TAKE 1 TABLET (25 MG TOTAL) BY MOUTH DAILY. 90 tablet 3   Magnesium  250 MG TABS Take 1 tablet (250 mg total) by mouth daily.  0   methylcellulose (CITRUCEL) oral powder Take 1 packet by mouth daily as needed (constipation).     niacinamide  500 MG tablet Take 1 tablet (500 mg total) by mouth daily.     Potassium 99 MG TABS Take 1 tablet (99 mg total) by mouth daily.     Probiotic Product (PROBIOTIC PO) Take 1 capsule by mouth daily.      RESTASIS 0.05 % ophthalmic emulsion Place 1 drop  into both eyes 2 (two) times daily.     sertraline  (ZOLOFT ) 25 MG tablet Take 1.5 tablets (37.5 mg total) by mouth at bedtime. 135 tablet 4   TURMERIC PO Take 900 mg by mouth daily.      No current facility-administered medications for this visit.     Allergies:   Blood-group specific substance, Lipitor [atorvastatin ], and Pravastatin    Social History:  The patient  reports that she has never smoked. She has never used smokeless tobacco. She reports current alcohol use. She reports that she does not use drugs.   Family History:   family history includes Alzheimer's disease in her mother; Cancer in her father; Cancer (age of onset: 72) in her maternal grandmother; Cancer (age of onset: 29) in her mother; Coronary artery disease (age of onset: 30) in her paternal grandmother; Dementia in her mother.    Review of Systems: Review of Systems  Constitutional:  Positive for malaise/fatigue.  HENT: Negative.    Respiratory:  Positive for shortness of breath.   Cardiovascular: Negative.   Gastrointestinal: Negative.   Musculoskeletal: Negative.   Neurological: Negative.   Psychiatric/Behavioral: Negative.    All other systems reviewed and are negative.  PHYSICAL EXAM: VS:  BP 110/68 (BP Location: Left Arm, Patient Position: Sitting, Cuff Size: Normal)   Pulse 73   Ht 5' 4.5" (1.638 m)   Wt 150 lb 2 oz (68.1 kg)   SpO2 98%   BMI 25.37 kg/m  , BMI Body mass index is 25.37 kg/m. Constitutional:  oriented to person, place, and time. No distress.  HENT:  Head: Grossly normal Eyes:  no discharge. No scleral icterus.  Neck: No JVD, no carotid bruits  Cardiovascular: Regular rate and rhythm, no murmurs appreciated Pulmonary/Chest: Clear to auscultation bilaterally, no wheezes or rails Abdominal: Soft.  no distension.  no tenderness.  Musculoskeletal: Normal range of motion Neurological:  normal muscle tone. Coordination normal. No atrophy Skin: Skin warm and dry Psychiatric: normal  affect, pleasant  Recent Labs: 09/06/2022: ALT 38; BUN 28; Creatinine, Ser 0.81; Hemoglobin 12.8; Platelets 220.0; Potassium 4.2; Sodium 141; TSH 4.91   Lipid Panel Lab Results  Component Value Date   CHOL 209 (H) 09/06/2022   HDL 60.80 09/06/2022   LDLCALC 127 (H) 09/06/2022   TRIG 106.0 09/06/2022     Wt Readings from Last 3 Encounters:  07/03/23 150 lb 2 oz (68.1 kg)  09/13/22 141 lb 6 oz (64.1 kg)  06/30/22 143 lb 2 oz (64.9 kg)     ASSESSMENT AND PLAN:  Problem List Items Addressed This Visit       Cardiology Problems   HLD (hyperlipidemia)   Atherosclerosis of aorta (HCC) - Primary   Relevant Orders   EKG 12-Lead (Completed)   Other Visit Diagnoses       Shortness of breath  Relevant Orders   EKG 12-Lead (Completed)     Preop cardiovascular evaluation Surgery on her hand in May 2025 and approximately 3 weeks Acceptable risk for surgery Does not need to hold any medications  Shortness of breath Chronic issue, recommend regular walking program Baseline echocardiogram ordered  Leg swelling No significant leg swelling, perhaps trace from venous insufficiency  Essential hypertension Continue losartan  and HCTZ  Blood pressure well-controlled  Aortic atherosclerosis Currently on Zetia , has statin intolerance May need to consider PCSK9 inhibitor Reports she has repeat lab work at primary care pending  Hyperlipidemia Plan as above      Signed, Juanda Noon, M.D., Ph.D. [facility] [place], [place] [phone]

## 2023-07-03 ENCOUNTER — Ambulatory Visit: Payer: Medicare Other | Attending: Cardiovascular Disease | Admitting: Cardiovascular Disease

## 2023-07-03 ENCOUNTER — Encounter: Payer: Self-pay | Admitting: Cardiovascular Disease

## 2023-07-03 VITALS — BP 110/68 | HR 73 | Ht 64.5 in | Wt 150.1 lb

## 2023-07-03 DIAGNOSIS — R0602 Shortness of breath: Secondary | ICD-10-CM

## 2023-07-03 DIAGNOSIS — E782 Mixed hyperlipidemia: Secondary | ICD-10-CM

## 2023-07-03 DIAGNOSIS — I700 Atherosclerosis of aorta: Secondary | ICD-10-CM

## 2023-07-03 NOTE — Patient Instructions (Addendum)
 Medication Instructions:  No changes  If you need a refill on your cardiac medications before your next appointment, please call your pharmacy.   Lab work: No new labs needed  Testing/Procedures: Echo for SOB, before May 23rd Best days May 16th to 22nd  Your physician has requested that you have an echocardiogram. Echocardiography is a painless test that uses sound waves to create images of your heart. It provides your doctor with information about the size and shape of your heart and how well your heart's chambers and valves are working.   You may receive an ultrasound enhancing agent through an IV if needed to better visualize your heart during the echo. This procedure takes approximately one hour.  There are no restrictions for this procedure.  This will take place at [address] [facility]) [address], [place] [address]  Please note: We ask at that you not bring children with you during ultrasound (echo/ vascular) testing. Due to room size and safety concerns, children are not allowed in the ultrasound rooms during exams. Our front office staff cannot provide observation of children in our lobby area while testing is being conducted. An adult accompanying a patient to their appointment will only be allowed in the ultrasound room at the discretion of the ultrasound technician under special circumstances. We apologize for any inconvenience.   Follow-Up: At [facility], you and your health needs are our priority.  As part of our continuing mission to provide you with exceptional heart care, we have created designated Provider Care Teams.  These Care Teams include your primary Cardiologist (physician) and Advanced Practice Providers (APPs -  Physician Assistants and Nurse Practitioners) who all work together to provide you with the care you need, when you need it.  You will need a follow up appointment in 12 months  Providers on your designated Care Team:   Laneta Pintos, NP Varney Gentleman, PA-C Cadence Gennaro Khat, [place]  COVID-19 Vaccine Information can be found at: [email or web address] For questions related to vaccine distribution or appointments, please email [email or web address] or call 2314344808.

## 2023-07-07 NOTE — Telephone Encounter (Signed)
   Patient Name: Kristin Kramer  DOB: 1941/01/16 MRN: [id number]  Primary Cardiologist: Belva Boyden, MD  Chart reviewed as part of pre-operative protocol coverage. Given past medical history and time since last visit, based on ACC/AHA guidelines, [name] is at acceptable risk for the planned procedure without further cardiovascular testing.   Cleared by Dr. Gollan on 07/03/2023 during office visit. She may hold aspirin  for 5-7 days prior to procedure. Please resume aspirin  as soon as possible postprocedure, at the discretion of the surgeon.    I will route this recommendation to the requesting party via Epic fax function and remove from pre-op pool.  Please call with questions.  Ava Boatman, NP 07/07/2023, 3:22 PM

## 2023-07-24 ENCOUNTER — Ambulatory Visit (HOSPITAL_COMMUNITY)
Admission: RE | Admit: 2023-07-24 | Discharge: 2023-07-24 | Disposition: A | Discharge status: Home / Self Care | Source: Ambulatory Visit | Attending: Internal Medicine | Admitting: Internal Medicine

## 2023-07-24 DIAGNOSIS — R0602 Shortness of breath: Secondary | ICD-10-CM | POA: Diagnosis not present

## 2023-07-24 LAB — ECHOCARDIOGRAM COMPLETE
Area-P 1/2: 3.52 cm2
S' Lateral: 2.2 cm

## 2023-07-28 DIAGNOSIS — M1811 Unilateral primary osteoarthritis of first carpometacarpal joint, right hand: Secondary | ICD-10-CM | POA: Diagnosis not present

## 2023-08-10 DIAGNOSIS — M1811 Unilateral primary osteoarthritis of first carpometacarpal joint, right hand: Secondary | ICD-10-CM | POA: Diagnosis not present

## 2023-08-17 ENCOUNTER — Ambulatory Visit

## 2023-08-17 VITALS — Ht 64.5 in | Wt 150.0 lb

## 2023-08-17 DIAGNOSIS — Z0000 Encounter for general adult medical examination without abnormal findings: Secondary | ICD-10-CM

## 2023-08-17 NOTE — Progress Notes (Signed)
 Please attest and cosign this visit due to patients primary care provider not being in the office at the time the visit was completed.    Subjective:   Kristin Kramer is a 83 y.o. who presents for a Medicare Wellness preventive visit.  As a reminder, Annual Wellness Visits don't include a physical exam, and some assessments may be limited, especially if this visit is performed virtually. We may recommend an in-person follow-up visit with your provider if needed.  Visit Complete: Virtual I connected with  Kristin Kramer on 08/17/23 by a audio enabled telemedicine application and verified that I am speaking with the correct person using two identifiers.  Patient Location: Home  Provider Location: Office/Clinic  I discussed the limitations of evaluation and management by telemedicine. The patient expressed understanding and agreed to proceed.  Vital Signs: Because this visit was a virtual/telehealth visit, some criteria may be missing or patient reported. Any vitals not documented were not able to be obtained and vitals that have been documented are patient reported.  VideoDeclined- This patient declined Librarian, academic. Therefore the visit was completed with audio only.  Persons Participating in Visit: Patient.  AWV Questionnaire: No: Patient Medicare AWV questionnaire was not completed prior to this visit.  Cardiac Risk Factors include: advanced age (>24men, >18 women);dyslipidemia     Objective:    Today's Vitals   08/17/23 1515  Weight: 150 lb (68 kg)  Height: 5' 4.5 (1.638 m)   Body mass index is 25.35 kg/m.     08/17/2023    3:31 PM 09/24/2021    8:52 AM 08/29/2019    9:52 AM 08/13/2019    2:39 PM 08/07/2019    4:03 PM 07/29/2019    9:15 AM 08/15/2018   11:34 AM  Advanced Directives  Does Patient Have a Medical Advance Directive? Yes Yes Yes Yes Yes Yes Yes  Type of Estate agent of [place];Living will   Healthcare Power of [place];Living will Healthcare Power of [place];Living will Living will;Healthcare Power of Attorney Living will;Healthcare Power of [employer] Power of [place];Living will  Does patient want to make changes to medical advance directive?    No - Patient declined No - Patient declined  No - Patient declined   Copy of Healthcare Power of Attorney in Chart? Yes - validated most recent copy scanned in chart (See row information)  Yes - validated most recent copy scanned in chart (See row information) Yes - validated most recent copy scanned in chart (See row information) No - copy requested  No - copy requested      Data saved with a previous flowsheet row definition    Current Medications (verified) Outpatient Encounter Medications as of 08/17/2023  Medication Sig   aspirin  EC 81 MG tablet Take 81 mg by mouth daily. Swallow whole.   busPIRone  (BUSPAR ) 5 MG tablet Take 1 tablet (5 mg total) by mouth 2 (two) times daily.   calcium  carbonate (OSCAL) 1500 (600 Ca) MG TABS tablet Take 600 mg of elemental calcium  by mouth daily with breakfast.   Cholecalciferol  (VITAMIN D -3) 5000 units TABS Take 5,000 Units by mouth daily.    cyanocobalamin  (VITAMIN B12) 1000 MCG tablet Take 1 tablet (1,000 mcg total) by mouth every Monday, Wednesday, and Friday.   ezetimibe  (ZETIA ) 10 MG tablet Take 1 tablet (10 mg total) by mouth daily.   famotidine  (PEPCID ) 20 MG tablet Take 20 mg by mouth daily as needed for heartburn or indigestion.  fexofenadine  (ALLEGRA  ALLERGY) 180 MG tablet Take 1 tablet (180 mg total) by mouth daily. (Patient taking differently: Take 180 mg by mouth daily. Takes prn)   hydrochlorothiazide  (MICROZIDE ) 12.5 MG capsule Take 1 capsule (12.5 mg total) by mouth daily.   levothyroxine  (SYNTHROID ) 50 MCG tablet Take 1 tablet (50 mcg total) by mouth daily before breakfast.   losartan  (COZAAR ) 25 MG tablet TAKE 1 TABLET (25 MG TOTAL) BY MOUTH DAILY.   Magnesium  250 MG TABS  Take 1 tablet (250 mg total) by mouth daily.   methylcellulose (CITRUCEL) oral powder Take 1 packet by mouth daily as needed (constipation).   niacinamide  500 MG tablet Take 1 tablet (500 mg total) by mouth daily.   Potassium 99 MG TABS Take 1 tablet (99 mg total) by mouth daily.   Probiotic Product (PROBIOTIC PO) Take 1 capsule by mouth daily.    RESTASIS 0.05 % ophthalmic emulsion Place 1 drop into both eyes 2 (two) times daily.   sertraline  (ZOLOFT ) 25 MG tablet Take 1.5 tablets (37.5 mg total) by mouth at bedtime.   TURMERIC PO Take 900 mg by mouth daily.    No facility-administered encounter medications on file as of 08/17/2023.    Allergies (verified) Blood-group specific substance, Lipitor [atorvastatin ], and Pravastatin    History: Past Medical History:  Diagnosis Date   Abdominal pain 12/2012   [facility] ER - mesenteric adenitis 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 left axis deviation;  b. 12/2011 Echo: EF 55-60%, no rwma, pasp .   Abnormal EKG    Chronic LAFB, incomplete RBBB Has been previously cleared by cardiology for surgery Jerelene Monday 2013, 2015)   Basal cell carcinoma 02/2021   R nose tip (Lomax), L lateral forearm and R nasal bridge (03/2023 -Stinehelfer)   Cervical neck pain with evidence of disc disease 08/05/2016   Last Assessment & Plan:  Ongoing cervical neck pain after fall. NSAIDs effective for relief. She has started seeing chiropractor. Reviewed ER workup including CT showing evidence of bulging disc and disc space loss.   Chronic nasal congestion 04/19/2017   Ongoing since sinus surgery 05/2016  S/p ENT and allergist eval.    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 Recovering well from this.   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    S/p bilateral knee replacement  Formatting of this note might be different from the original. 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  high cymbalta  doses cause tremors   Mesenteric lymphadenitis 2014   ?  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    Seasonal allergies    cats, dust, mold, roaches   Squamous cell skin cancer    multiple - R anterior leg (Dr. 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   Vitamin D   deficiency    Past Surgical History:  Procedure Laterality Date   AUGMENTATION MAMMAPLASTY     BREAST ENHANCEMENT SURGERY     BUNIONECTOMY  2013   R and L foot   CATARACT EXTRACTION Bilateral 2013   R, pending L   COLONOSCOPY  2007   no records received   COLONOSCOPY  08/2014   tubular adenoma, rpt 5 yrs Sandrea Cruel)   COLONOSCOPY  06/2020   4 polyps removed, TA, no rpt recommended Sandrea Cruel)   CONVERSION TO TOTAL HIP Right 12/07/2017   Procedure: CONVERSION TO TOTAL HIP ( REMOVING SMITH AND NEPHEW NAIL);  Surgeon: Molli Angelucci, MD;  Location: [facility] ORS;  Service: Orthopedics;  Laterality: Right;   COSMETIC SURGERY  2002   face lift   dexa  06/2008   T -1.4 spine and hip   ETHMOIDECTOMY Bilateral 05/19/2016   Procedure: ETHMOIDECTOMY;  Surgeon: Rogers Clayman, MD;  Location: [facility] ORS;  Service: ENT;  Laterality: Bilateral;   EYE SURGERY     bilateral cataract surgery   FRONTAL SINUS EXPLORATION Bilateral 05/19/2016   Procedure: FRONTAL SINUS EXPLORATION;  Surgeon: Rogers Clayman, MD;  Location: [facility] OR[facility];  Service: ENT;  Laterality: Bilateral;   INTRAMEDULLARY (IM) NAIL INTERTROCHANTERIC Right 07/08/2017   Procedure: RIGHT INTERTROCHANTRIC IM NAIL;  Surgeon: Wes Hamman, MD;  Location: [facility] OR;  Service: Orthopedics;  Laterality: Right;   MAXILLARY ANTROSTOMY Bilateral 05/19/2016   Procedure: MAXILLARY ANTROSTOMY;  Surgeon: Rogers Clayman, MD;  Location: [facility] OR[facility];  Service: ENT;  Laterality: Bilateral;   MOHS SURGERY  2011   R leg   PARTIAL HYSTERECTOMY  1980   fibroids, ovaries remained   SINUS ENDO W/FUSION N/A 05/19/2016   Procedure: ENDOSCOPIC SINUS SURGERY WITH NAVIGATION;  Surgeon: Rogers Clayman, MD;  Location: [facility] OR[facility];  Service: ENT;  Laterality: N/A;   SPHENOIDECTOMY Bilateral 05/19/2016   Procedure: Rupert Counts;  Surgeon: Rogers Clayman, MD;  Location: [facility] OR[facility];  Service: ENT;  Laterality: Bilateral;   SQUAMOUS CELL CARCINOMA EXCISION Right 07/13/2016   Dr.  Avis Boehringer, [facility]   TONSILLECTOMY AND ADENOIDECTOMY  1945   TOTAL HIP REVISION Right 08/07/2019   Procedure: Right hip acetabular versus total hip arthroplasty revision-posterior;  Surgeon: Liliane Rei, MD;  Location: [facility];  Service: Orthopedics;  Laterality: Right;    TOTAL KNEE ARTHROPLASTY  01/02/2012   Right - Surgeon: Genevie Kerns, MD   TOTAL KNEE ARTHROPLASTY Left 03/18/2013   Surgeon: Genevie Kerns, MD   Family History  Problem Relation  Age of Onset   Cancer Mother 70       lung, smoker   Dementia Mother    Alzheimer's disease Mother        onset likely in mid 45s   Cancer Father        lung, smoker   Cancer Maternal Grandmother 93       leukemia   Coronary artery disease Paternal Grandmother 80       sudden cardiac death   Diabetes Neg Hx    Stroke Neg Hx    Breast cancer Neg Hx    Colon cancer Neg Hx    Esophageal cancer Neg Hx    Rectal cancer Neg Hx    Stomach cancer Neg Hx    Social History   Socioeconomic History   Marital status: Widowed    Spouse name: Not on file   Number of children: 3   Years of education: 12   Highest education level: High school graduate  Occupational History   Occupation: Retired  Tobacco Use   Smoking status: Never   Smokeless tobacco: Never  Vaping Use   Vaping status: Never Used  Substance and Sexual Activity   Alcohol use: Yes    Alcohol/week: 0.0 standard drinks of alcohol    Comment: occasional   Drug use: No   Sexual activity: Not Currently  Other Topics Concern   Not on file  Social History Narrative   Lives alone.  Brother lives nearby.   Occupation: retired, was Diplomatic Services operational officer and housewife   Edu: 1 yr college   Activity: limited by knee   Diet: good water , fruits/vegetables daily   Religion: Jehova's witness   Social Drivers of Corporate investment banker Strain: Low Risk  (08/17/2023)   Overall Financial Resource Strain (CARDIA)    Difficulty of Paying Living Expenses: Not  hard at all  Food Insecurity: No Food Insecurity (08/17/2023)   Hunger Vital Sign    Worried About Running Out of Food in the Last Year: Never true    Ran Out of Food in the Last Year: Never true  Transportation Needs: No Transportation Needs (08/17/2023)   PRAPARE - Administrator, Civil Service (Medical): No    Lack of Transportation (Non-Medical): No  Physical Activity: Insufficiently Active (08/17/2023)   Exercise Vital Sign    Days of Exercise per Week: 3 days    Minutes of Exercise per Session: 40 min  Stress: No Stress Concern Present (08/17/2023)   [employer] of Occupational Health - Occupational Stress Questionnaire    Feeling of Stress: Only a little  Social Connections: Moderately Integrated (08/17/2023)   Social Connection and Isolation Panel    Frequency of Communication with Friends and Family: More than three times a week    Frequency of Social Gatherings with Friends and Family: Three times a week    Attends Religious Services: More than 4 times per year    Active Member of Clubs or Organizations: Yes    Attends Banker Meetings: More than 4 times per year    Marital Status: Widowed    Tobacco Counseling Counseling given: Not Answered    Clinical Intake:  Pre-visit preparation completed: Yes  Pain : No/denies pain     BMI - recorded: 25.35 Nutritional Status: BMI 25 -29 Overweight Nutritional Risks: None Diabetes: No  Lab Results  Component Value Date   HGBA1C 5.8 09/06/2022   HGBA1C 5.9 03/28/2022   HGBA1C 5.8 09/02/2021  How often do you need to have someone help you when you read instructions, pamphlets, or other written materials from your doctor or pharmacy?: 1 - Never  Interpreter Needed?: No  Comments: lives alone Information entered by :: B.Kealii Thueson,LPN   Activities of Daily Living     08/17/2023    3:31 PM  In your present state of health, do you have any difficulty performing the following activities:   Hearing? 1  Vision? 0  Difficulty concentrating or making decisions? 0  Walking or climbing stairs? 0  Dressing or bathing? 0  Doing errands, shopping? 0  Preparing Food and eating ? N  Using the Toilet? N  In the past six months, have you accidently leaked urine? N  Do you have problems with loss of bowel control? N  Managing your Medications? N  Managing your Finances? N  Housekeeping or managing your Housekeeping? N    Patient Care Team: Claire Crick, MD as PCP - General (Family Medicine) Jerelene Monday Deadra Everts, MD as PCP - Cardiology (Cardiology) Arminda Berth, OD as Consulting Physician (Optometry) [place], Delaney Fearing, [facility] (Inactive) as Pharmacist (Pharmacist)  I have updated your Care Teams any recent Medical Services you may have received from other providers in the past year.     Assessment:   This is a routine wellness examination for Kristin Kramer.  Hearing/Vision screen Hearing Screening - Comments:: Pt says she needs hearing aides/has been tested Vision Screening - Comments:: Pt says her vision is good w/glasses Dr Particia Bolus is due..says will call for appt   Goals Addressed             This Visit's Progress    Patient Stated   On track    08/17/23- I will continue to take medications as prescribed.      Patient Stated   On track    08/17/23-, I will continue to walk at least 3 days a week for 45 minutes.     Patient Stated       I would like to stay as active as I can and independent       Depression Screen     08/17/2023    3:26 PM 09/13/2022    7:58 AM 04/26/2022   11:59 AM 03/28/2022    3:44 PM 09/10/2021    9:36 AM 09/08/2020   10:38 AM 08/29/2019    9:53 AM  PHQ 2/9 Scores  PHQ - 2 Score 1 0 0 0 2 1 0  PHQ- 9 Score  1 1 5 7 9  0    Fall Risk     08/17/2023    3:19 PM 09/13/2022    7:58 AM 06/30/2022    9:25 AM 04/26/2022   11:59 AM 03/28/2022    3:44 PM  Fall Risk   Falls in the past year? 0 0 0 0 0  Number falls in past yr: 0  0    Injury with  Fall? 0  0    Risk for fall due to : No Fall Risks  No Fall Risks    Follow up Education provided;Falls prevention discussed  Falls evaluation completed      MEDICARE RISK AT HOME:  Medicare Risk at Home Any stairs in or around the home?: No If so, are there any without handrails?: No Home free of loose throw rugs in walkways, pet beds, electrical cords, etc?: Yes Adequate lighting in your home to reduce risk of falls?: Yes Life alert?: No Use of a cane, walker  or w/c?: No Grab bars in the bathroom?: No Shower chair or bench in shower?: Yes Elevated toilet seat or a handicapped toilet?: Yes  TIMED UP AND GO:  Was the test performed?  No  Cognitive Function: 6CIT completed    08/29/2019    9:56 AM 08/15/2018   11:40 AM 08/09/2017   10:23 AM 08/05/2016   10:25 AM  MMSE - Mini Mental State Exam  Not completed: Refused     Orientation to time  5 5 5    Orientation to Place  5 5 5    Registration  3 3 3    Attention/ Calculation  0 0 0   Recall  3 3 3    Language- name 2 objects  0 0 0   Language- repeat  1 1 1   Language- follow 3 step command  0 3 2   Language- follow 3 step command-comments    pt was unable to follow 1 step of 3 step command   Language- read & follow direction  0 0 0   Write a sentence  0 0 0   Copy design  0 0 0   Total score  17 20 19       Data saved with a previous flowsheet row definition        08/17/2023    3:33 PM  6CIT Screen  What Year? 0 points  What month? 0 points  What time? 0 points  Count back from 20 0 points  Months in reverse 0 points  Repeat phrase 2 points  Total Score 2 points    Immunizations Immunization History  Administered Date(s) Administered   Influenza Split 12/01/2011   Influenza, High Dose Seasonal PF 02/10/2014, 11/27/2017, 11/28/2018, 11/26/2019, 11/10/2020, 11/30/2021, 12/14/2022   Influenza,inj,Quad PF,6+ Mos 01/14/2013, 12/29/2014, 02/04/2016   Influenza-Unspecified 10/06/2006, 12/24/2008, 01/03/2017   PFIZER  Comirnaty(Gray Top)Covid-19 Tri-Sucrose Vaccine 07/14/2020, 11/30/2021   PFIZER(Purple Top)SARS-COV-2 Vaccination 04/15/2019, 05/06/2019, 12/31/2019   Pfizer Covid-19 Vaccine Bivalent Booster 27yrs & up 01/14/2021   Pfizer(Comirnaty)Fall Seasonal Vaccine 12 years and older 12/14/2022   Pneumococcal Conjugate-13 07/31/2014   Pneumococcal Polysaccharide-23 12/01/2011   Respiratory Syncytial Virus Vaccine,Recomb Aduvanted(Arexvy) 03/09/2022   Tdap 10/22/2014   Zoster Recombinant(Shingrix) 11/29/2016, 10/21/2017   Zoster, Live 02/14/2013    Screening Tests Health Maintenance  Topic Date Due   COVID-19 Vaccine (8 - Pfizer risk 2024-25 season) 06/14/2023   MAMMOGRAM  09/05/2023   INFLUENZA VACCINE  10/06/2023   Medicare Annual Wellness (AWV)  08/16/2024   DTaP/Tdap/Td (2 - Td or Tdap) 10/21/2024   Pneumococcal Vaccine: 50+ Years  Completed   DEXA SCAN  Completed   Zoster Vaccines- Shingrix  Completed   HPV VACCINES  Aged Out   Meningococcal B Vaccine  Aged Out   Colonoscopy  Discontinued   Hepatitis C Screening  Discontinued    Health Maintenance  Health Maintenance Due  Topic Date Due   COVID-19 Vaccine (8 - Pfizer risk 2024-25 season) 06/14/2023   Health Maintenance Items Addressed: None needed at this time  Additional Screening:  Vision Screening: Recommended annual ophthalmology exams for early detection of glaucoma and other disorders of the eye. Would you like a referral to an eye doctor? No    Dental Screening: Recommended annual dental exams for proper oral hygiene  Community Resource Referral / Chronic Care Management: CRR required this visit?  No   CCM required this visit?  No   Plan:    I have personally reviewed and noted the following in the  patient's chart:   Medical and social history Use of alcohol, tobacco or illicit drugs  Current medications and supplements including opioid prescriptions. Patient is not currently taking opioid  prescriptions. Functional ability and status Nutritional status Physical activity Advanced directives List of other physicians Hospitalizations, surgeries, and ER visits in previous 12 months Vitals Screenings to include cognitive, depression, and falls Referrals and appointments  In addition, I have reviewed and discussed with patient certain preventive protocols, quality metrics, and best practice recommendations. A written personalized care plan for preventive services as well as general preventive health recommendations were provided to patient.   Kristin Bannister, LPN   11/03/5619   After Visit Summary: (MyChart) Due to this being a telephonic visit, the after visit summary with patients personalized plan was offered to patient via MyChart   Notes: Nothing significant to report at this time.

## 2023-08-17 NOTE — Patient Instructions (Signed)
 Ms. Folden , Thank you for taking time out of your busy schedule to complete your Annual Wellness Visit with me. I enjoyed our conversation and look forward to speaking with you again next year. I, as well as your care team,  appreciate your ongoing commitment to your health goals. Please review the following plan we discussed and let me know if I can assist you in the future. Your Game plan/ To Do List    Follow up Visits: Next Medicare AWV with our clinical staff: 08/20/24 @ 3pm televisit   Have you seen your provider in the last 6 months (3 months if uncontrolled diabetes)? No Next Office Visit with your provider: 09/15/23  Clinician Recommendations:  Aim for 30 minutes of exercise or brisk walking, 6-8 glasses of water , and 5 servings of fruits and vegetables each day.       This is a list of the screening recommended for you and due dates:  Health Maintenance  Topic Date Due   COVID-19 Vaccine (8 - Pfizer risk 2024-25 season) 06/14/2023   Mammogram  09/05/2023   Flu Shot  10/06/2023   Medicare Annual Wellness Visit  08/16/2024   DTaP/Tdap/Td vaccine (2 - Td or Tdap) 10/21/2024   Pneumococcal Vaccine for age over 45  Completed   DEXA scan (bone density measurement)  Completed   Zoster (Shingles) Vaccine  Completed   HPV Vaccine  Aged Out   Meningitis B Vaccine  Aged Out   Colon Cancer Screening  Discontinued   Hepatitis C Screening  Discontinued    Advanced directives: (In Chart) A copy of your advanced directives are scanned into your chart should your provider ever need it. Advance Care Planning is important because it:  [x]  Makes sure you receive the medical care that is consistent with your values, goals, and preferences  [x]  It provides guidance to your family and loved ones and reduces their decisional burden about whether or not they are making the right decisions based on your wishes.  Follow the link provided in your after visit summary or read over the paperwork we  have mailed to you to help you started getting your Advance Directives in place. If you need assistance in completing these, please reach out to us  so that we can help you!

## 2023-08-20 ENCOUNTER — Ambulatory Visit: Payer: Self-pay | Admitting: Cardiovascular Disease

## 2023-08-21 ENCOUNTER — Encounter: Payer: Self-pay | Admitting: Emergency Medicine

## 2023-08-24 DIAGNOSIS — M13841 Other specified arthritis, right hand: Secondary | ICD-10-CM | POA: Diagnosis not present

## 2023-08-24 DIAGNOSIS — M25541 Pain in joints of right hand: Secondary | ICD-10-CM | POA: Diagnosis not present

## 2023-08-31 DIAGNOSIS — L700 Acne vulgaris: Secondary | ICD-10-CM | POA: Diagnosis not present

## 2023-08-31 DIAGNOSIS — Z85828 Personal history of other malignant neoplasm of skin: Secondary | ICD-10-CM | POA: Diagnosis not present

## 2023-08-31 DIAGNOSIS — L820 Inflamed seborrheic keratosis: Secondary | ICD-10-CM | POA: Diagnosis not present

## 2023-08-31 DIAGNOSIS — M25541 Pain in joints of right hand: Secondary | ICD-10-CM | POA: Diagnosis not present

## 2023-09-04 DIAGNOSIS — M25541 Pain in joints of right hand: Secondary | ICD-10-CM | POA: Diagnosis not present

## 2023-09-09 ENCOUNTER — Other Ambulatory Visit: Payer: Self-pay | Admitting: Family Medicine

## 2023-09-09 DIAGNOSIS — E782 Mixed hyperlipidemia: Secondary | ICD-10-CM

## 2023-09-09 DIAGNOSIS — E559 Vitamin D deficiency, unspecified: Secondary | ICD-10-CM

## 2023-09-09 DIAGNOSIS — K654 Sclerosing mesenteritis: Secondary | ICD-10-CM

## 2023-09-09 DIAGNOSIS — R7303 Prediabetes: Secondary | ICD-10-CM

## 2023-09-09 DIAGNOSIS — E039 Hypothyroidism, unspecified: Secondary | ICD-10-CM

## 2023-09-09 DIAGNOSIS — E538 Deficiency of other specified B group vitamins: Secondary | ICD-10-CM

## 2023-09-11 ENCOUNTER — Ambulatory Visit: Payer: Self-pay | Admitting: Family Medicine

## 2023-09-11 ENCOUNTER — Other Ambulatory Visit (INDEPENDENT_AMBULATORY_CARE_PROVIDER_SITE_OTHER): Payer: Medicare Other

## 2023-09-11 DIAGNOSIS — E782 Mixed hyperlipidemia: Secondary | ICD-10-CM

## 2023-09-11 DIAGNOSIS — R7303 Prediabetes: Secondary | ICD-10-CM | POA: Diagnosis not present

## 2023-09-11 DIAGNOSIS — E538 Deficiency of other specified B group vitamins: Secondary | ICD-10-CM

## 2023-09-11 DIAGNOSIS — K654 Sclerosing mesenteritis: Secondary | ICD-10-CM

## 2023-09-11 DIAGNOSIS — E039 Hypothyroidism, unspecified: Secondary | ICD-10-CM

## 2023-09-11 DIAGNOSIS — E559 Vitamin D deficiency, unspecified: Secondary | ICD-10-CM | POA: Diagnosis not present

## 2023-09-11 LAB — COMPREHENSIVE METABOLIC PANEL WITH GFR
ALT: 14 U/L (ref 0–35)
AST: 19 U/L (ref 0–37)
Albumin: 4.2 g/dL (ref 3.5–5.2)
Alkaline Phosphatase: 91 U/L (ref 39–117)
BUN: 22 mg/dL (ref 6–23)
CO2: 30 meq/L (ref 19–32)
Calcium: 9.6 mg/dL (ref 8.4–10.5)
Chloride: 102 meq/L (ref 96–112)
Creatinine, Ser: 0.76 mg/dL (ref 0.40–1.20)
GFR: 72.89 mL/min (ref >60.00)
Glucose, Bld: 97 mg/dL (ref 70–99)
Potassium: 4.3 meq/L (ref 3.5–5.1)
Sodium: 140 meq/L (ref 135–145)
Total Bilirubin: 0.5 mg/dL (ref 0.2–1.2)
Total Protein: 6.6 g/dL (ref 6.0–8.3)

## 2023-09-11 LAB — LIPID PANEL
Cholesterol: 215 mg/dL — ABNORMAL HIGH (ref 0–200)
HDL: 60.5 mg/dL (ref >39.00)
LDL Cholesterol: 133 mg/dL — ABNORMAL HIGH (ref 0–99)
NonHDL: 154.54
Total CHOL/HDL Ratio: 4
Triglycerides: 110 mg/dL (ref 0.0–149.0)
VLDL: 22 mg/dL (ref 0.0–40.0)

## 2023-09-11 LAB — CBC WITH DIFFERENTIAL/PLATELET
Basophils Absolute: 0 K/uL (ref 0.0–0.1)
Basophils Relative: 0.6 % (ref 0.0–3.0)
Eosinophils Absolute: 0.2 K/uL (ref 0.0–0.7)
Eosinophils Relative: 2.7 % (ref 0.0–5.0)
HCT: 36.9 % (ref 36.0–46.0)
Hemoglobin: 12.2 g/dL (ref 12.0–15.0)
Lymphocytes Relative: 25.4 % (ref 12.0–46.0)
Lymphs Abs: 1.4 K/uL (ref 0.7–4.0)
MCHC: 33.1 g/dL (ref 30.0–36.0)
MCV: 83.7 fl (ref 78.0–100.0)
Monocytes Absolute: 0.7 K/uL (ref 0.1–1.0)
Monocytes Relative: 11.7 % (ref 3.0–12.0)
Neutro Abs: 3.3 K/uL (ref 1.4–7.7)
Neutrophils Relative %: 59.6 % (ref 43.0–77.0)
Platelets: 253 K/uL (ref 150.0–400.0)
RBC: 4.41 Mil/uL (ref 3.87–5.11)
RDW: 15.2 % (ref 11.5–15.5)
WBC: 5.6 K/uL (ref 4.0–10.5)

## 2023-09-11 LAB — VITAMIN D 25 HYDROXY (VIT D DEFICIENCY, FRACTURES): VITD: 55.76 ng/mL (ref 30.00–100.00)

## 2023-09-11 LAB — VITAMIN B12: Vitamin B-12: 430 pg/mL (ref 211–911)

## 2023-09-11 LAB — TSH: TSH: 3.23 u[IU]/mL (ref 0.35–5.50)

## 2023-09-11 LAB — HEMOGLOBIN A1C: Hgb A1c MFr Bld: 6.1 % (ref 4.6–6.5)

## 2023-09-12 DIAGNOSIS — M25541 Pain in joints of right hand: Secondary | ICD-10-CM | POA: Diagnosis not present

## 2023-09-15 ENCOUNTER — Ambulatory Visit (INDEPENDENT_AMBULATORY_CARE_PROVIDER_SITE_OTHER): Payer: Medicare Other | Admitting: Family Medicine

## 2023-09-15 ENCOUNTER — Encounter: Payer: Self-pay | Admitting: Family Medicine

## 2023-09-15 VITALS — BP 112/70 | HR 99 | Temp 98.0°F | Ht 65.0 in | Wt 143.4 lb

## 2023-09-15 DIAGNOSIS — F330 Major depressive disorder, recurrent, mild: Secondary | ICD-10-CM

## 2023-09-15 DIAGNOSIS — Z7189 Other specified counseling: Secondary | ICD-10-CM | POA: Diagnosis not present

## 2023-09-15 DIAGNOSIS — G3184 Mild cognitive impairment, so stated: Secondary | ICD-10-CM

## 2023-09-15 DIAGNOSIS — E538 Deficiency of other specified B group vitamins: Secondary | ICD-10-CM | POA: Diagnosis not present

## 2023-09-15 DIAGNOSIS — E039 Hypothyroidism, unspecified: Secondary | ICD-10-CM

## 2023-09-15 DIAGNOSIS — F411 Generalized anxiety disorder: Secondary | ICD-10-CM

## 2023-09-15 DIAGNOSIS — R7303 Prediabetes: Secondary | ICD-10-CM | POA: Diagnosis not present

## 2023-09-15 DIAGNOSIS — E559 Vitamin D deficiency, unspecified: Secondary | ICD-10-CM | POA: Diagnosis not present

## 2023-09-15 DIAGNOSIS — E782 Mixed hyperlipidemia: Secondary | ICD-10-CM | POA: Diagnosis not present

## 2023-09-15 DIAGNOSIS — I83813 Varicose veins of bilateral lower extremities with pain: Secondary | ICD-10-CM | POA: Diagnosis not present

## 2023-09-15 DIAGNOSIS — M1811 Unilateral primary osteoarthritis of first carpometacarpal joint, right hand: Secondary | ICD-10-CM

## 2023-09-15 DIAGNOSIS — Z0000 Encounter for general adult medical examination without abnormal findings: Secondary | ICD-10-CM | POA: Diagnosis not present

## 2023-09-15 DIAGNOSIS — M85859 Other specified disorders of bone density and structure, unspecified thigh: Secondary | ICD-10-CM

## 2023-09-15 MED ORDER — BUSPIRONE HCL 5 MG PO TABS: 5.0000 mg | ORAL_TABLET | Freq: Two times a day (BID) | ORAL | 4 refills | Status: AC

## 2023-09-15 MED ORDER — HYDROCHLOROTHIAZIDE 12.5 MG PO CAPS: 12.5000 mg | ORAL_CAPSULE | Freq: Every day | ORAL | 3 refills | Status: AC

## 2023-09-15 MED ORDER — SERTRALINE HCL 25 MG PO TABS: 25.0000 mg | ORAL_TABLET | Freq: Every day | ORAL | 3 refills | Status: AC

## 2023-09-15 MED ORDER — EZETIMIBE 10 MG PO TABS: 10.0000 mg | ORAL_TABLET | Freq: Every day | ORAL | 3 refills | Status: AC

## 2023-09-15 MED ORDER — LEVOTHYROXINE SODIUM 50 MCG PO TABS: 50.0000 ug | ORAL_TABLET | Freq: Every day | ORAL | 3 refills | Status: AC

## 2023-09-15 NOTE — Assessment & Plan Note (Addendum)
 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  Discussed symptomatic care Discussed compression  Discussed VVS eval if not improved.

## 2023-09-15 NOTE — Progress Notes (Signed)
 Ph: [phone] Fax: [phone]   Patient ID: Kristin Kramer, female    DOB: 08-29-1940, 83 y.o.   MRN: [id number]  This visit was conducted in person.  BP 112/70   Pulse 99   Temp 98 F (36.7 C) (Oral)   Ht 5' 5 (1.651 m)   Wt 143 lb 6 oz (65 kg)   SpO2 96%   BMI 23.86 kg/m    CC: CPE Subjective:   HPI: Kristin Kramer is a 83 y.o. female presenting on 09/15/2023 for Annual Exam [facility] prt 2 [AWV-08/17/23].)   Saw health advisor 08/2023 for medicare wellness visit. Note reviewed.   No results found.  Flowsheet Row Office Visit from 09/15/2023 in [facility] [place]  PHQ-2 Total Score 3       09/15/2023    8:36 AM 08/17/2023    3:19 PM 09/13/2022    7:58 AM 06/30/2022    9:25 AM 04/26/2022   11:59 AM  Fall Risk   Falls in the past year? 1 0 0 0 0  Number falls in past yr:  0  0   Injury with Fall?  0  0   Risk for fall due to :  No Fall Risks  No Fall Risks   Follow up  Education provided;Falls prevention discussed  Falls evaluation completed     S/p neurocognitive evaluation by Dr Richie 01/2021 - dx MCI of unclear etiology. To consider rpt eval in 2-3 yrs, sooner if worsening functional decline. She feels memory is worsening as well as personality - prefers to to avoid crowds, staying home. Previously on aricept  - didn't note significant change.   Recent 1st [facility] replacement on right 07/28/2023 (Gramig). Currently undergoing OT. Situational mood trouble from this - very difficulty recovery as she lives alone.   Worsening varicose veins - R>L symptomatic and at times develops lesions from itching  Preventative: COLONOSCOPY Date: 08/2014 tubular adenoma, rpt 5 yrs Oma).  Colonoscopy 06/09/2020 - 4 polyps removed, TA, no rpt recommended [facility]).  Well woman exam - 2012 by prior PCP. Hysterectomy for fibroids, ovaries remained. Menopause early 72s. Denies pelvic pain or vaginal bleeding. [facility]  DEXA  08/2016 -2.4 hip, -1.4 spine  DEXA 08/2018 - T -2.1 at hip, increased hip fx risk (4.6%).  DEXA 03/2021 - L femur neck -2.2 with increased risk of fracture.  Continues vit D 5000 IU daily as well as calcium  600mg  daily and regular weight bearing exercise. Has never been on bone strengthening medication - has previously declined.  Lung cancer screening - not eligible  Flu shot yearly  COVID vaccine - Pfizer 04/2019, 05/2019, booster x2 12/2019, 07/2020, bivalent 01/2021, 11/2021 Pneumovax 2013, prevnar-13 2016  Tdap 10/2014  RSV 2024 zostavax - 02/2013  Shingrix - 11/2016, 10/2017  Advanced directives - reviewed and scanned 08/2017. Lists Marvis Andra and [employer] as HCPOA. Jehova's witness - no blood transfusions but would want minor fractions of blood discussed with her. Does not want life prolonged if hopeless situation. Doesn't think would want compression/ intubation.  Seat belt use discussed Sunscreen use discussed. No changing moles on skin. Dr Cary retired - now seeing Dr Arlen. H/o Mohs to nose 2 months ago.  Non smoker  Alcohol - occasional socially  Dentist - Q6 months  Eye exam - yearly  Bowel - no constipation Bladder - ongoing urinary incontinence day and night, progressive over the past 2 years, predominant stress  incontinence symptoms, wears pad.    Lives alone. Brother lives nearby. 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 Interim medical history since our last visit reviewed. Allergies and medications reviewed and updated. Outpatient Medications Prior to Visit  Medication Sig Dispense Refill   aspirin  EC 81 MG tablet Take 81 mg by mouth daily. Swallow whole.     calcium  carbonate (OSCAL) 1500 (600 Ca) MG TABS tablet Take 600 mg of elemental  calcium  by mouth daily with breakfast.     Cholecalciferol  (VITAMIN D -3) 5000 units TABS Take 5,000 Units by mouth daily.      cyanocobalamin  (VITAMIN B12) 1000 MCG tablet Take 1 tablet (1,000 mcg total) by mouth every Monday, Wednesday, and Friday.     famotidine  (PEPCID ) 20 MG tablet Take 20 mg by mouth daily as needed for heartburn or indigestion.     fexofenadine  (ALLEGRA  ALLERGY) 180 MG tablet Take 1 tablet (180 mg total) by mouth daily. (Patient taking differently: Take 180 mg by mouth daily. Takes prn)     losartan  (COZAAR ) 25 MG tablet TAKE 1 TABLET (25 MG TOTAL) BY MOUTH DAILY. 90 tablet 3   Magnesium  250 MG TABS Take 1 tablet (250 mg total) by mouth daily.  0   methylcellulose (CITRUCEL) oral powder Take 1 packet by mouth daily as needed (constipation).     niacinamide  500 MG tablet Take 1 tablet (500 mg total) by mouth daily.     Potassium 99 MG TABS Take 1 tablet (99 mg total) by mouth daily.     Probiotic Product (PROBIOTIC PO) Take 1 capsule by mouth daily.      RESTASIS 0.05 % ophthalmic emulsion Place 1 drop into both eyes 2 (two) times daily.     TURMERIC PO Take 900 mg by mouth daily.      busPIRone  (BUSPAR ) 5 MG tablet Take 1 tablet (5 mg total) by mouth 2 (two) times daily. 180 tablet 4   ezetimibe  (ZETIA ) 10 MG tablet Take 1 tablet (10 mg total) by mouth daily. 90 tablet 4   hydrochlorothiazide  (MICROZIDE ) 12.5 MG capsule Take 1 capsule (12.5 mg total) by mouth daily. 90 capsule 4   levothyroxine  (SYNTHROID ) 50 MCG tablet Take 1 tablet (50 mcg total) by mouth daily before breakfast. 90 tablet 4   sertraline  (ZOLOFT ) 25 MG tablet Take 1.5 tablets (37.5 mg total) by mouth at bedtime. 135 tablet 4   No facility-administered medications prior to visit.     Per HPI unless specifically indicated in ROS section below Review of Systems  Constitutional:  Negative for activity change, appetite change, chills, fatigue, fever and unexpected weight change.  HENT:  Negative for  hearing loss.   Eyes:  Negative for visual disturbance.  Respiratory:  Negative for cough, chest tightness, shortness of breath and wheezing.   Cardiovascular:  Negative for chest pain, palpitations and leg swelling.  Gastrointestinal:  Negative for abdominal distention, abdominal pain, blood in stool, constipation, diarrhea, nausea and vomiting.  Genitourinary:  Negative for difficulty urinating and hematuria.  Musculoskeletal:  Negative for arthralgias, myalgias and neck pain.       Developing tender varicose veins  Skin:  Negative for rash.  Neurological:  Negative for dizziness, seizures, syncope and headaches.  Hematological:  Negative for adenopathy. Does not  bruise/bleed easily.  Psychiatric/Behavioral:  Negative for dysphoric mood. The patient is not nervous/anxious.     Objective:  BP 112/70   Pulse 99   Temp 98 F (36.7 C) (Oral)   Ht 5' 5 (1.651 m)   Wt 143 lb 6 oz (65 kg)   SpO2 96%   BMI 23.86 kg/m   Wt Readings from Last 3 Encounters:  09/15/23 143 lb 6 oz (65 kg)  08/17/23 150 lb (68 kg)  07/03/23 150 lb 2 oz (68.1 kg)      Physical Exam Vitals and nursing note reviewed.  Constitutional:      Appearance: Normal appearance. She is not ill-appearing.  HENT:     Head: Normocephalic and atraumatic.     Right Ear: Tympanic membrane, ear canal and external ear normal. There is no impacted cerumen.     Left Ear: Tympanic membrane, ear canal and external ear normal. There is no impacted cerumen.     Mouth/Throat:     Mouth: Mucous membranes are moist.     Pharynx: Oropharynx is clear. No oropharyngeal exudate or posterior oropharyngeal erythema.  Eyes:     General:        Right eye: No discharge.        Left eye: No discharge.     Extraocular Movements: Extraocular movements intact.     Conjunctiva/sclera: Conjunctivae normal.     Pupils: Pupils are equal, round, and reactive to light.  Neck:     Thyroid : No thyroid  mass or thyromegaly.     Vascular: No  carotid bruit.  Cardiovascular:     Rate and Rhythm: Normal rate and regular rhythm.     Pulses: Normal pulses.     Heart sounds: Normal heart sounds. No murmur heard. Pulmonary:     Effort: Pulmonary effort is normal. No respiratory distress.     Breath sounds: Normal breath sounds. No wheezing, rhonchi or rales.  Abdominal:     General: Bowel sounds are normal. There is no distension.     Palpations: Abdomen is soft. There is no mass.     Tenderness: There is no abdominal tenderness. There is no guarding or rebound.     Hernia: No hernia is present.  Musculoskeletal:     Cervical back: Normal range of motion and neck supple. No rigidity.     Right lower leg: No edema.     Left lower leg: No edema.     Comments:  R hand in cast/splint Varicosities to R>L lower legs  Lymphadenopathy:     Cervical: No cervical adenopathy.  Skin:    General: Skin is warm and dry.     Findings: No rash.  Neurological:     General: No focal deficit present.     Mental Status: She is alert. Mental status is at baseline.  Psychiatric:        Mood and Affect: Mood normal.        Behavior: Behavior normal.       Results for orders placed or performed in visit on 09/11/23  VITAMIN D  25 Hydroxy (Vit-D Deficiency, Fractures)   Collection Time: 09/11/23  8:08 AM  Result Value Ref Range   VITD 55.76 30.00 - 100.00 ng/mL  Vitamin B12   Collection Time: 09/11/23  8:08 AM  Result Value Ref Range   Vitamin B-12 430 211 - 911 pg/mL  CBC with Differential/Platelet   Collection Time: 09/11/23  8:08 AM  Result Value Ref Range   WBC  5.6 4.0 - 10.5 K/uL   RBC 4.41 3.87 - 5.11 Mil/uL   Hemoglobin 12.2 12.0 - 15.0 g/dL   HCT 63.0 63.9 - 53.9 %   MCV 83.7 78.0 - 100.0 fl   MCHC 33.1 30.0 - 36.0 g/dL   RDW 84.7 88.4 - 84.4 %   Platelets 253.0 150.0 - 400.0 K/uL   Neutrophils Relative % 59.6 43.0 - 77.0 %   Lymphocytes Relative 25.4 12.0 - 46.0 %   Monocytes Relative 11.7 3.0 - 12.0 %   Eosinophils  Relative 2.7 0.0 - 5.0 %   Basophils Relative 0.6 0.0 - 3.0 %   Neutro Abs 3.3 1.4 - 7.7 K/uL   Lymphs Abs 1.4 0.7 - 4.0 K/uL   Monocytes Absolute 0.7 0.1 - 1.0 K/uL   Eosinophils Absolute 0.2 0.0 - 0.7 K/uL   Basophils Absolute 0.0 0.0 - 0.1 K/uL  TSH   Collection Time: 09/11/23  8:08 AM  Result Value Ref Range   TSH 3.23 0.35 - 5.50 uIU/mL  Hemoglobin A1c   Collection Time: 09/11/23  8:08 AM  Result Value Ref Range   Hgb A1c MFr Bld 6.1 4.6 - 6.5 %  Comprehensive metabolic panel with GFR   Collection Time: 09/11/23  8:08 AM  Result Value Ref Range   Sodium 140 135 - 145 mEq/L   Potassium 4.3 3.5 - 5.1 mEq/L   Chloride 102 96 - 112 mEq/L   CO2 30 19 - 32 mEq/L   Glucose, Bld 97 70 - 99 mg/dL   BUN 22 6 - 23 mg/dL   Creatinine, Ser 9.23 0.40 - 1.20 mg/dL   Total Bilirubin 0.5 0.2 - 1.2 mg/dL   Alkaline Phosphatase 91 39 - 117 U/L   AST 19 0 - 37 U/L   ALT 14 0 - 35 U/L   Total Protein 6.6 6.0 - 8.3 g/dL   Albumin 4.2 3.5 - 5.2 g/dL   GFR 27.10 >39.99 mL/min   Calcium  9.6 8.4 - 10.5 mg/dL  Lipid panel   Collection Time: 09/11/23  8:08 AM  Result Value Ref Range   Cholesterol 215 (H) 0 - 200 mg/dL   Triglycerides 889.9 0.0 - 149.0 mg/dL   HDL 39.49 >60.99 mg/dL   VLDL 77.9 0.0 - 59.9 mg/dL   LDL Cholesterol 866 (H) 0 - 99 mg/dL   Total CHOL/HDL Ratio 4    NonHDL 154.54     Assessment & Plan:   Problem List Items Addressed This Visit     Advanced care planning/counseling discussion (Chronic)   Previously discussed      Health maintenance examination - Primary (Chronic)   Preventative protocols reviewed and updated unless pt declined. Discussed healthy diet and lifestyle.       Major depressive disorder   Chronic, stable period on low dose sertraline  and buspar  - continue. Situational worsening discussed in setting of disability from recent R hand surgery.       Relevant Medications   sertraline  (ZOLOFT ) 25 MG tablet   busPIRone  (BUSPAR ) 5 MG tablet    Osteopenia   Discussed calcium , vit d and regular weight bearing exercise. Update DEXA scan.       Relevant Orders   DG Bone Density   HLD (hyperlipidemia)   Chronic, stable. Continue zetia . The ASCVD Risk score (Arnett DK, et al., 2019) failed to calculate for the following reasons:   The 2019 ASCVD risk score is only valid for ages 62 to 29  Relevant Medications   ezetimibe  (ZETIA ) 10 MG tablet   hydrochlorothiazide  (MICROZIDE ) 12.5 MG capsule   Vitamin D  deficiency   Chronic, stable. Continue 5000 units daily.       Hypothyroidism   Chronic, stable. Continue low dose levothyroxine .       Relevant Medications   levothyroxine  (SYNTHROID ) 50 MCG tablet   Varicose veins of both lower extremities with pain   Symptomatic R>L varicosities.  Discussed symptomatic care Discussed compression  Discussed VVS eval if not improved.       Relevant Medications   ezetimibe  (ZETIA ) 10 MG tablet   hydrochlorothiazide  (MICROZIDE ) 12.5 MG capsule   Generalized anxiety disorder   Continue buspar  and sertraline  as per above.       Relevant Medications   sertraline  (ZOLOFT ) 25 MG tablet   busPIRone  (BUSPAR ) 5 MG tablet   Pre-diabetes   Encouraged limiting added sugar in diet.        Mild cognitive impairment   She notes worsening cognition.  Reviewed 4 lifestyle modifications to support a healthy mind as per instructions. Will re-refer for rpt neurocognitive evaluation.       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         Meds ordered this encounter  Medications   sertraline  (ZOLOFT ) 25 MG tablet    Sig: Take 1 tablet (25 mg total) by mouth at bedtime.    Dispense:  90 tablet    Refill:  3   busPIRone  (BUSPAR ) 5 MG tablet    Sig: Take 1 tablet (5 mg total) by mouth 2 (two)  times daily.    Dispense:  180 tablet    Refill:  4   ezetimibe  (ZETIA ) 10 MG tablet    Sig: Take 1 tablet (10 mg total) by mouth daily.    Dispense:  90 tablet    Refill:  3   hydrochlorothiazide  (MICROZIDE ) 12.5 MG capsule    Sig: Take 1 capsule (12.5 mg total) by mouth daily.    Dispense:  90 capsule    Refill:  3   levothyroxine  (SYNTHROID ) 50 MCG tablet    Sig: Take 1 tablet (50 mcg total) by mouth daily before breakfast.    Dispense:  90 tablet    Refill:  3    Orders Placed This Encounter  Procedures   DG Bone Density    Standing Status:   Future    Expiration Date:   09/14/2024    Reason for Exam (SYMPTOM  OR DIAGNOSIS REQUIRED):   osteopenia with increased fracture risk    Preferred [facility]?:   [facility]   Ambulatory referral to Neurology    Referral Priority:   Routine    Referral Type:   Consultation    Referral Reason:   Specialty Services Required    Requested Specialty:   Neurology    Number of Visits Requested:   1    Patient Instructions  Good to see you today  Reviewed 4 core lifestyle modifications to support a healthy mind:  1. Nutritious well balance diet.  2. Regular physical activity routine.  3. Regular mental activity such as reading books, word puzzles, math puzzles, jigsaw puzzles.  4. Social engagement.  Also ensure good blood pressure control, limit alcohol, no smoking.  We will refer you back to Dr Richie for re evaluation  Call to schedule mammogram and bone density scan at your convenience: [facility] [phone]. Return as needed or in 1 year for next physical.   Follow up plan: Return in about 1 year (around 09/14/2024) for annual exam, prior fasting for blood work, medicare wellness visit.  Anton Blas, MD

## 2023-09-15 NOTE — Patient Instructions (Addendum)
 Good to see you today  Reviewed 4 core lifestyle modifications to support a healthy mind:  1. Nutritious well balance diet.  2. Regular physical activity routine.  3. Regular mental activity such as reading books, word puzzles, math puzzles, jigsaw puzzles.  4. Social engagement.  Also ensure good blood pressure control, limit alcohol, no smoking.    We will refer you back to Dr Richie for re evaluation  Call to schedule mammogram and bone density scan at your convenience: [facility] [phone]. Return as needed or in 1 year for next physical.

## 2023-09-15 NOTE — Assessment & Plan Note (Signed)
Chronic, stable. Continue low dose levothyroxine.

## 2023-09-15 NOTE — Assessment & Plan Note (Signed)
 S/p recent [facility] replacement surgery 07/2023 (G[facility]ig)

## 2023-09-15 NOTE — Assessment & Plan Note (Signed)
 Discussed calcium , vit d and regular weight bearing exercise. Update DEXA scan.

## 2023-09-15 NOTE — Assessment & Plan Note (Signed)
 Continue buspar  and sertraline  as per above.

## 2023-09-15 NOTE — Assessment & Plan Note (Signed)
 Preventative protocols reviewed and updated unless pt declined. Discussed healthy diet and lifestyle.

## 2023-09-15 NOTE — Assessment & Plan Note (Signed)
 Chronic, stable. Continue zetia . The ASCVD Risk score (Arnett DK, et al., 2019) failed to calculate for the following reasons:   The 2019 ASCVD risk score is only valid for ages 18 to 53

## 2023-09-15 NOTE — Assessment & Plan Note (Signed)
 Chronic, stable. Continue 5000 units daily.

## 2023-09-15 NOTE — Assessment & Plan Note (Signed)
Encouraged limiting added sugar in diet.  

## 2023-09-15 NOTE — Assessment & Plan Note (Addendum)
 She notes worsening cognition.  Reviewed 4 lifestyle modifications to support a healthy mind as per instructions. Will re-refer for rpt neurocognitive evaluation.

## 2023-09-15 NOTE — Assessment & Plan Note (Signed)
 Continue b12 MWF replacement.

## 2023-09-15 NOTE — Assessment & Plan Note (Signed)
 Previously discussed.

## 2023-09-15 NOTE — Assessment & Plan Note (Signed)
 Chronic, stable period on low dose sertraline  and buspar  - continue. Situational worsening discussed in setting of disability from recent R hand surgery.

## 2023-09-21 DIAGNOSIS — M25541 Pain in joints of right hand: Secondary | ICD-10-CM | POA: Diagnosis not present

## 2023-09-21 DIAGNOSIS — Z4789 Encounter for other orthopedic aftercare: Secondary | ICD-10-CM | POA: Diagnosis not present

## 2023-09-28 DIAGNOSIS — M25541 Pain in joints of right hand: Secondary | ICD-10-CM | POA: Diagnosis not present

## 2023-10-05 DIAGNOSIS — M25541 Pain in joints of right hand: Secondary | ICD-10-CM | POA: Diagnosis not present

## 2023-10-16 ENCOUNTER — Other Ambulatory Visit: Payer: Self-pay | Admitting: Family Medicine

## 2023-10-16 ENCOUNTER — Encounter: Payer: Self-pay | Admitting: Family Medicine

## 2023-10-16 DIAGNOSIS — Z1231 Encounter for screening mammogram for malignant neoplasm of breast: Secondary | ICD-10-CM

## 2023-10-19 DIAGNOSIS — M25541 Pain in joints of right hand: Secondary | ICD-10-CM | POA: Diagnosis not present

## 2023-11-02 DIAGNOSIS — M25541 Pain in joints of right hand: Secondary | ICD-10-CM | POA: Diagnosis not present

## 2023-11-02 DIAGNOSIS — Z4789 Encounter for other orthopedic aftercare: Secondary | ICD-10-CM | POA: Diagnosis not present

## 2023-11-02 DIAGNOSIS — M1811 Unilateral primary osteoarthritis of first carpometacarpal joint, right hand: Secondary | ICD-10-CM | POA: Diagnosis not present

## 2023-11-08 DIAGNOSIS — M25511 Pain in right shoulder: Secondary | ICD-10-CM | POA: Insufficient documentation

## 2023-11-09 ENCOUNTER — Ambulatory Visit
Admission: RE | Admit: 2023-11-09 | Discharge: 2023-11-09 | Disposition: A | Discharge status: Home / Self Care | Source: Ambulatory Visit | Attending: Family Medicine | Admitting: Family Medicine

## 2023-11-09 DIAGNOSIS — Z1231 Encounter for screening mammogram for malignant neoplasm of breast: Secondary | ICD-10-CM | POA: Diagnosis not present

## 2023-11-15 ENCOUNTER — Ambulatory Visit: Payer: Self-pay | Admitting: Family Medicine

## 2023-11-22 DIAGNOSIS — M25511 Pain in right shoulder: Secondary | ICD-10-CM | POA: Diagnosis not present

## 2023-12-20 ENCOUNTER — Encounter: Payer: Self-pay | Admitting: Psychology

## 2023-12-22 ENCOUNTER — Ambulatory Visit: Admitting: Psychology

## 2023-12-22 ENCOUNTER — Encounter: Payer: Self-pay | Admitting: Psychology

## 2023-12-22 ENCOUNTER — Ambulatory Visit: Payer: Self-pay

## 2023-12-22 DIAGNOSIS — F411 Generalized anxiety disorder: Secondary | ICD-10-CM | POA: Diagnosis not present

## 2023-12-22 DIAGNOSIS — R4189 Other symptoms and signs involving cognitive functions and awareness: Secondary | ICD-10-CM

## 2023-12-22 DIAGNOSIS — F09 Unspecified mental disorder due to known physiological condition: Secondary | ICD-10-CM

## 2023-12-22 NOTE — Progress Notes (Signed)
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 assessing safety and judgment.  Assessed receptive language abilities were average. Likewise, Ms. Boateng did not exhibit any difficulties comprehending task instructions and answered all questions asked of her appropriately. Assessed expressive language (e.g., verbal fluency and confrontation naming) was mildly variable but overall appropriate, ranging from the below average to above average normative ranges.     Assessed visuospatial/visuoconstructional abilities were average outside an isolated impairment when asked to draw a clock. Points were lost on her drawing of a clock due to her placing the number 10 twice and only drawing a single clock hand.    Learning (i.e., encoding) of novel verbal information was average to above average. Spontaneous delayed recall (i.e., retrieval) of previously learned information was mildly variable but overall appropriate, ranging from the below average to above average normative ranges. Retention rates were appropriate across all tasks. Performance across a list-based recognition task strong, suggesting evidence for information consolidation.   Results of emotional screening instruments suggested that recent  symptoms of generalized anxiety were in the mild to moderate range, while symptoms of depression were within the mild range. A screening instrument assessing recent sleep quality suggested the presence of mild sleep dysfunction.  Table of Scores:   Note: This summary of test scores accompanies the interpretive report and should not be considered in isolation without reference to the appropriate sections in the text. Descriptors are based on appropriate normative data and may be adjusted based on clinical judgment. Terms such as Within Normal Limits and Outside Normal Limits are used when a more specific description of the test score cannot be determined. Descriptors refer to the current evaluation only.        Percentile - Normative Descriptor > 98 -  Exceptionally High 91-97 - Well Above Average 75-90 - Above Average 25-74 - Average 9-24 - Below Average 2-8 - Well Below Average < 2 - Exceptionally Low        Validity: October 2022 Current  DESCRIPTOR        DCT: --- --- --- Within Normal Limits  RBANS EI: --- --- --- Within Normal Limits        Orientation:       Raw Score Raw Score Percentile   NAB Orientation, Form 1 29/29 29/29 --- ---        Cognitive Screening:       Raw Score Raw Score Percentile   SLUMS: 21/30 23/30 --- ---        RBANS, Form A: Standard Score/ Scaled Score Standard Score/ Scaled Score Percentile   Total Score 90 97 42 Average  Immediate Memory 73 100 50 Average    List Learning 5 12 75 Above Average    Story Memory 5 8 25  Average  Visuospatial/Constructional 96 96 39 Average    Figure Copy 9 10 50 Average    Line Orientation 17/20 15/20 26-50 Average  Language 105 99 47 Average    Picture Naming 10/10 10/10 >75 Above Average    Semantic Fluency 11 9 37 Average  Attention 94 97 42 Average    Digit Span 8 9 37 Average    Coding 10 10 50 Average  Delayed Memory 98 101 53 Average    List Recall 6/10 8/10 >75 Above Average    List Recognition  19/20 20/20 51-75 Average    Story Recall 8 7 16  Below Average    Figure Recall 8 6 9  Below Average         Intellectual Functioning:       Standard Score Standard Score Percentile   Test of Premorbid Functioning: 96 99 47 Average        Attention/Executive Function:      Trail Making Test (TMT): Raw Score (Scaled Score) Raw Score (Scaled Score) Percentile     Part A 43 secs., 2 errors (9) 40 secs.,  0 errors (11) 63 Average    Part B 146 secs.,  0 errors (8) 207 secs.,  2 errors (7) 16 Below Average  *Based on [employer] Older Normative Studies (MOANS)       Scaled Score Scaled Score Percentile   WAIS-IV Digit Span: 8 6 9  Below Average    Forward 9 8 25  Average    Backward 9 11 63 Average    Sequencing 5 1 <1 Exceptionally Low         Scaled Score Scaled Score Percentile   WAIS-IV Similarities: 10 12 75 Above Average        D-KEFS Color-Word Interference Test: Raw Score (Scaled Score) Raw Score (Scaled Score) Percentile     Color Naming 33 secs. (10) 37 secs. (10) 50 Average    Word Reading 20 secs. (13) 24 secs. (11) 63 Average    Inhibition 107 secs. (5) 94 secs. (10) 50 Average      Total Errors 2 errors (11) 2 errors (11) 63 Average    Inhibition/Switching 81 secs. (10) 116 secs. (8) 25 Average      Total Errors 1 error (12) 12 errors (2) <1 Exceptionally Low        NAB Executive Functions Module, Form 1: T Score T Score Percentile     Judgment 46 52 58 Average  Language:      Verbal Fluency Test: Raw Score (Scaled Score) Raw Score (Scaled Score) Percentile     Phonemic Fluency (CFL) 24 (8) 34 (10) 50 Average    Category Fluency 32 (9) 25 (6) 9 Below Average  *Based on [employer] Older Normative Studies (MOANS)            NAB Language Module, Form 1: T Score T Score Percentile     Auditory Comprehension 58 48 42 Average    Naming 29/31 (50) 29/31 (53) 62 Average        Visuospatial/Visuoconstruction:       Raw Score Raw Score Percentile   Clock Drawing: 9/10  5/10 --- Impaired        Mood and Personality:       Raw Score Raw Score Percentile   Geriatric Depression Scale: 15 10 --- Mild  Geriatric Anxiety Scale: 34 21 --- Mild    Somatic 13 10 --- Moderate    Cognitive 10 4 --- Mild    Affective 11 7 --- Moderate        Additional Questionnaires:       Raw Score Raw Score Percentile   PROMIS Sleep Disturbance Questionnaire: 21 29 --- Mild   Informed Consent and Coding/Compliance:   The current evaluation represents a clinical evaluation for the purposes previously outlined by the referral source and is in no way reflective of a forensic evaluation.   Ms. Pitz was provided with a verbal description of the nature and purpose of the present neuropsychological evaluation. Also reviewed were the foreseeable risks and/or discomforts and benefits of the procedure, limits of confidentiality, and mandatory reporting requirements of this provider. The patient was given the opportunity to ask questions and receive answers about the evaluation. Oral consent to participate was provided by the patient.   This evaluation was conducted by Arthea KYM Maryland, Ph.D., ABPP-CN, board certified clinical neuropsychologist. Ms. Smyre completed a clinical interview with Dr. Maryland, billed as one unit [phone], and 147 minutes of cognitive testing and scoring, billed as one unit [phone] and four additional units 96139. Psychometrist Evalene Pizza, B.S. assisted Dr. Maryland with test administration and scoring procedures. As a separate and discrete service, one unit [phone] and two units 96133 (158 minutes) were billed for Dr. Loralee time spent in interpretation and report writing.

## 2023-12-22 NOTE — Progress Notes (Signed)
   Psychometrician Note   Cognitive testing was administered to Kristin Kramer by Evalene Pizza, B.S. (psychometrist) under the supervision of Dr. Arthea KYM Maryland, Ph.D., ABPP, licensed psychologist on 12/22/2023. Ms. Peugh did not appear overtly distressed by the testing session per behavioral observation or responses across self-report questionnaires. Rest breaks were offered.   The battery of tests administered was selected by Dr. Zachary C. Merz, Ph.D., ABPP with consideration to [name]'s current level of functioning, the nature of her symptoms, emotional and behavioral responses during interview, level of literacy, observed level of motivation/effort, and the nature of the referral question. This battery was communicated to the psychometrist. Communication between Dr. Arthea KYM Maryland, Ph.D., ABPP and the psychometrist was ongoing throughout the evaluation and Dr. Arthea KYM Maryland, Ph.D., ABPP was immediately accessible at all times. Dr. Zachary C. Merz, Ph.D., ABPP provided supervision to the psychometrist on the date of this service to the extent necessary to assure the quality of all services provided.    Kristin Kramer will return within approximately 1-2 weeks for an interactive feedback session with Dr. Maryland at which time her test performances, clinical impressions, and treatment recommendations will be reviewed in detail. [name] understands she can contact our office should she require our assistance before this time.  A total of 147 minutes of billable time were spent face-to-face with [name] by the psychometrist. This includes both test administration and scoring time. Billing for these services is reflected in the clinical report generated by Dr. Arthea KYM Maryland, Ph.D., ABPP  This note reflects time spent with the psychometrician and does not include test scores or any clinical interpretations made by Dr. Maryland. The full report will follow in a separate note.

## 2023-12-29 ENCOUNTER — Ambulatory Visit: Admitting: Psychology

## 2023-12-29 DIAGNOSIS — F09 Unspecified mental disorder due to known physiological condition: Secondary | ICD-10-CM | POA: Diagnosis not present

## 2023-12-29 DIAGNOSIS — F411 Generalized anxiety disorder: Secondary | ICD-10-CM

## 2023-12-29 NOTE — Progress Notes (Signed)
   Neuropsychology Feedback Session [name]. [facility] [place] Department of Neurology  Reason for Referral:   Kristin Kramer is a 83 y.o. right-handed Caucasian female referred by Anton Blas, M.D., to characterize her current cognitive functioning and assist with diagnostic clarity and treatment planning in the context of subjective cognitive decline.   Feedback:   Kristin Kramer completed a comprehensive neuropsychological evaluation on 12/22/2023. Please refer to that encounter for the full report and recommendations. Briefly, results suggested an isolated impairment when asked to draw the face of a clock. Performance variability was also exhibited across complex attention and, to a lesser extent, executive functioning. Performances were appropriate relative to age-matched peers across all other assessed cognitive domains. This includes processing speed, basic attention, safety/judgment, receptive language, verbal fluency, confrontation naming, visuospatial abilities outside of clock drawing, and all aspects of learning and memory. Across mood-related questionnaires, Kristin Kramer reported acute symptoms of mild depression and moderate anxiety. This continues to represent the most likely culprit for subjective cognitive dysfunction, especially in light of a very large degree of stability over the past three years from a testing standpoint.  Kristin Kramer was unaccompanied during the current feedback session. Content of the current session focused on the results of her neuropsychological evaluation. Kristin Kramer was given the opportunity to ask questions and her questions were answered. She was encouraged to reach out should additional questions arise. A copy of her report was provided at the conclusion of the visit.      One unit 96132 (34 minutes) was billed for Dr. Loralee time spent preparing for, conducting, and documenting the current feedback session with Kristin Kramer.

## 2024-01-08 ENCOUNTER — Encounter: Payer: Self-pay | Admitting: Radiology

## 2024-01-10 ENCOUNTER — Encounter: Payer: Self-pay | Admitting: Family Medicine

## 2024-01-10 ENCOUNTER — Ambulatory Visit (INDEPENDENT_AMBULATORY_CARE_PROVIDER_SITE_OTHER): Admitting: Family Medicine

## 2024-01-10 VITALS — BP 120/76 | HR 90 | Temp 97.9°F | Ht 65.0 in | Wt 145.0 lb

## 2024-01-10 DIAGNOSIS — R1310 Dysphagia, unspecified: Secondary | ICD-10-CM | POA: Diagnosis not present

## 2024-01-10 DIAGNOSIS — K219 Gastro-esophageal reflux disease without esophagitis: Secondary | ICD-10-CM

## 2024-01-10 MED ORDER — ESOMEPRAZOLE MAGNESIUM 40 MG PO CPDR: 40.0000 mg | DELAYED_RELEASE_CAPSULE | Freq: Every day | ORAL | 3 refills | Status: DC

## 2024-01-10 NOTE — Assessment & Plan Note (Signed)
 Chronic, deteriorated despite OTC remedies. Rx nexium 40mg  daily x3 wks then PRN Order barium swallow for dysphagia, refer to GI to consider EGD.

## 2024-01-10 NOTE — Patient Instructions (Addendum)
 I have ordered swallow study - you may call centralized scheduling at [place] to schedule appointment: [phone]   Start nexium 40mg  daily for 3 weeks then as needed. I will refer you to [facility] for further evaluation as well.   VISIT SUMMARY: Today, you were seen for worsening symptoms of acid reflux, including a hoarse voice, bad taste in your mouth in the mornings, and a dry cough. You also reported a sensation of something being stuck in your throat, especially when swallowing pills and sometimes with solid foods. You have tried over-the-counter medications and dietary changes without significant relief.  YOUR PLAN: -GASTROESOPHAGEAL REFLUX DISEASE (GERD) WITH DYSPHAGIA: GERD is a condition where stomach acid frequently flows back into the tube connecting your mouth and stomach, causing irritation. Dysphagia means difficulty swallowing. You have been prescribed esomeprazole 40 mg to take daily before your largest meal for three weeks, then as needed. A barium swallow study has been ordered to check for any anatomical causes of your swallowing difficulties. You have also been referred to a gastroenterologist for further evaluation, which may include an endoscopy. Additionally, you should avoid spicy and acidic foods and reduce your caffeine intake.  INSTRUCTIONS: Please take esomeprazole 40 mg daily before your largest meal for three weeks, then as needed. Schedule and complete the barium swallow study as ordered. Follow up with the gastroenterologist for further evaluation. Make dietary changes by avoiding spicy and acidic foods and reducing your caffeine intake.  Head of bed elevated.  Avoidance of citrus, fatty foods, chocolate, peppermint, and excessive alcohol, along with sodas, orange juice (acidic drinks) At least a few hours between dinner and bed, minimize naps after eating. No smoking.

## 2024-01-10 NOTE — Progress Notes (Signed)
 Ph: [phone] Fax: [phone]   Patient ID: Kristin Kramer, female    DOB: 06/28/1940, 83 y.o.   MRN: [id number]  This visit was conducted in person.  BP 120/76   Pulse 90   Temp 97.9 F (36.6 C) (Oral)   Ht 5' 5 (1.651 m)   Wt 145 lb (65.8 kg)   SpO2 98%   BMI 24.13 kg/m    Chief Complaint  Patient presents with   Gastroesophageal Reflux    Subjective:   Discussed the use of AI scribe software for clinical note transcription with the patient, who gave verbal consent to proceed.  History of Present Illness   Kristin Kramer is an 83 year old female who presents with worsening acid reflux symptoms.  She experiences a persistent hoarse voice, a horrible taste in her mouth in the mornings, and a dry cough without phlegm. There is a sensation of something being stuck in her throat, especially when swallowing pills and sometimes with solid foods like bread and meats. Discomfort is located in her upper chest and throat, described as a dull, achy sensation.  She has attempted to manage her symptoms by eating dinner early, avoiding spicy foods, and reducing sugar intake in the evenings. She has tried over-the-counter medications such as omeprazole  20 mg and famotidine  20 mg, both taken once daily, without significant relief. These medications have been used intermittently for a couple of months.  Denies fever, chills, or significant weight changes are present. Occasional nausea occurs after eating, but there is no vomiting. Bowel habits remain unchanged, and there is no abdominal pain.  She does not smoke or use significant alcohol. She drinks one to two cups of coffee in the morning but avoids caffeine for the rest of the day. She consumes a lot of tomatoes, which may contribute to her symptoms.      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  Interim medical history since our last visit reviewed. Allergies and medications reviewed and updated. Outpatient Medications Prior to Visit  Medication Sig Dispense Refill   aspirin  EC 81 MG tablet Take 81 mg by mouth daily. Swallow whole.     busPIRone  (BUSPAR ) 5 MG tablet Take 1 tablet (5 mg total) by mouth 2 (two) times daily. 180 tablet 4   calcium  carbonate (OSCAL) 1500 (600 Ca) MG TABS tablet Take 600 mg of elemental calcium  by mouth daily with breakfast.     Cholecalciferol  (VITAMIN D -3) 5000 units TABS Take 5,000 Units by mouth daily.      cyanocobalamin  (VITAMIN B12) 1000 MCG tablet Take 1 tablet (1,000 mcg total) by mouth every Monday, Wednesday, and Friday.     ezetimibe  (ZETIA ) 10 MG tablet Take 1 tablet (10 mg total) by mouth daily. 90 tablet 3   famotidine  (PEPCID ) 20 MG tablet Take 20 mg by mouth daily as needed for heartburn or indigestion.     fexofenadine  (ALLEGRA  ALLERGY) 180 MG tablet Take 1 tablet (180 mg total) by mouth daily. (Patient taking differently: Take 180 mg by mouth daily. Takes prn)     hydrochlorothiazide  (MICROZIDE ) 12.5 MG capsule Take 1 capsule (12.5 mg total) by mouth daily. 90 capsule 3   levothyroxine  (SYNTHROID ) 50 MCG tablet Take 1 tablet (50 mcg total) by mouth daily before breakfast. 90 tablet 3   losartan  (COZAAR ) 25 MG tablet  TAKE 1 TABLET (25 MG TOTAL) BY MOUTH DAILY. 90 tablet 3   Magnesium  250 MG TABS Take 1 tablet (250 mg total) by mouth daily.  0   methylcellulose (CITRUCEL) oral powder Take 1 packet by mouth daily as needed (constipation).     niacinamide  500 MG tablet Take 1 tablet (500 mg total) by mouth daily.     Potassium 99 MG TABS Take 1 tablet (99 mg total) by mouth daily.     Probiotic Product (PROBIOTIC PO) Take 1 capsule by mouth daily.      RESTASIS 0.05 % ophthalmic emulsion Place 1 drop into both eyes 2 (two) times daily.     sertraline  (ZOLOFT ) 25 MG tablet Take 1 tablet (25 mg total) by mouth at bedtime. 90 tablet 3   TURMERIC  PO Take 900 mg by mouth daily.      No facility-administered medications prior to visit.     Per HPI unless specifically indicated in ROS section below Review of Systems  Objective:  BP 120/76   Pulse 90   Temp 97.9 F (36.6 C) (Oral)   Ht 5' 5 (1.651 m)   Wt 145 lb (65.8 kg)   SpO2 98%   BMI 24.13 kg/m   Wt Readings from Last 3 Encounters:  01/10/24 145 lb (65.8 kg)  09/15/23 143 lb 6 oz (65 kg)  08/17/23 150 lb (68 kg)            Physical Exam Vitals and nursing note reviewed.  Constitutional:      Appearance: Normal appearance. She is not ill-appearing.  HENT:     Mouth/Throat:     Mouth: Mucous membranes are moist.     Pharynx: Oropharynx is clear. No oropharyngeal exudate or posterior oropharyngeal erythema.  Eyes:     Extraocular Movements: Extraocular movements intact.     Pupils: Pupils are equal, round, and reactive to light.  Cardiovascular:     Rate and Rhythm: Normal rate and regular rhythm.     Pulses: Normal pulses.     Heart sounds: Normal heart sounds. No murmur heard. Pulmonary:     Effort: Pulmonary effort is normal. No respiratory distress.     Breath sounds: Normal breath sounds. No wheezing, rhonchi or rales.  Abdominal:     General: Abdomen is flat. Bowel sounds are normal. There is no distension.     Palpations: Abdomen is soft. There is no mass.     Tenderness: There is no abdominal tenderness. There is no guarding or rebound.     Hernia: No hernia is present.  Musculoskeletal:     Right lower leg: No edema.     Left lower leg: No edema.  Skin:    General: Skin is warm and dry.     Findings: No rash.  Neurological:     Mental Status: She is alert.  Psychiatric:        Mood and Affect: Mood normal.        Behavior: Behavior normal.       Results           Lab Results  Component Value Date   NA 140 09/11/2023   CL 102 09/11/2023   K 4.3 09/11/2023   CO2 30 09/11/2023   BUN 22 09/11/2023   CREATININE 0.76 09/11/2023    GFR 72.89 09/11/2023   CALCIUM  9.6 09/11/2023   ALBUMIN 4.2 09/11/2023   GLUCOSE 97 09/11/2023    Lab Results  Component Value Date  ALT 14 09/11/2023   AST 19 09/11/2023   ALKPHOS 91 09/11/2023   BILITOT 0.5 09/11/2023    Lab Results  Component Value Date   WBC 5.6 09/11/2023   HGB 12.2 09/11/2023   HCT 36.9 09/11/2023   MCV 83.7 09/11/2023   PLT 253.0 09/11/2023    Assessment & Plan:      Gastroesophageal reflux disease (GERD) with dysphagia Chronic GERD with worsening symptoms and dysphagia. No other red flags.  - Prescribed esomeprazole 40 mg daily before largest meal for three weeks, then as needed. - Ordered barium swallow study to evaluate anatomical causes of dysphagia. - Referred to gastroenterology for further evaluation, including potential endoscopy. - Advised dietary modifications: avoid spicy foods, acidic foods, reduce caffeine intake.      Problem List Items Addressed This Visit     GERD (gastroesophageal reflux disease) - Primary   Chronic, deteriorated despite OTC remedies. Rx nexium 40mg  daily x3 wks then PRN Order barium swallow for dysphagia, refer to GI to consider EGD.       Relevant Medications   esomeprazole (NEXIUM) 40 MG capsule   Other Relevant Orders   DG ESOPHAGUS W DOUBLE CM (HD)   Ambulatory referral to Gastroenterology   Dysphagia   Order barium swallow, refer to GI.       Relevant Orders   DG ESOPHAGUS W DOUBLE CM (HD)   Ambulatory referral to Gastroenterology     Meds ordered this encounter  Medications   esomeprazole (NEXIUM) 40 MG capsule    Sig: Take 1 capsule (40 mg total) by mouth daily.    Dispense:  30 capsule    Refill:  3    Orders Placed This Encounter  Procedures   DG ESOPHAGUS W DOUBLE CM (HD)    Standing Status:   Future    Expiration Date:   01/09/2025    Reason for Exam (SYMPTOM  OR DIAGNOSIS REQUIRED):   dysphagia    Preferred imaging location?:   [facility]   Ambulatory referral to  Gastroenterology    Referral Priority:   Routine    Referral Type:   Consultation    Referral Reason:   Specialty Services Required    Number of Visits Requested:   1    Patient Instructions  I have ordered swallow study - you may call centralized scheduling at [place] to schedule appointment: [phone]   Start nexium 40mg  daily for 3 weeks then as needed. I will refer you to [facility] for further evaluation as well.   VISIT SUMMARY: Today, you were seen for worsening symptoms of acid reflux, including a hoarse voice, bad taste in your mouth in the mornings, and a dry cough. You also reported a sensation of something being stuck in your throat, especially when swallowing pills and sometimes with solid foods. You have tried over-the-counter medications and dietary changes without significant relief.  YOUR PLAN: -GASTROESOPHAGEAL REFLUX DISEASE (GERD) WITH DYSPHAGIA: GERD is a condition where stomach acid frequently flows back into the tube connecting your mouth and stomach, causing irritation. Dysphagia means difficulty swallowing. You have been prescribed esomeprazole 40 mg to take daily before your largest meal for three weeks, then as needed. A barium swallow study has been ordered to check for any anatomical causes of your swallowing difficulties. You have also been referred to a gastroenterologist for further evaluation, which may include an endoscopy. Additionally, you should avoid spicy and acidic foods and reduce your caffeine intake.  INSTRUCTIONS: Please take esomeprazole 40  mg daily before your largest meal for three weeks, then as needed. Schedule and complete the barium swallow study as ordered. Follow up with the gastroenterologist for further evaluation. Make dietary changes by avoiding spicy and acidic foods and reducing your caffeine intake.  Head of bed elevated.  Avoidance of citrus, fatty foods, chocolate, peppermint, and excessive alcohol, along with sodas, orange juice  (acidic drinks) At least a few hours between dinner and bed, minimize naps after eating. No smoking.   Follow up plan: No follow-ups on file.  Anton Blas, MD

## 2024-01-10 NOTE — Assessment & Plan Note (Signed)
 Order barium swallow, refer to GI.

## 2024-02-20 ENCOUNTER — Encounter: Payer: Self-pay | Admitting: Family Medicine

## 2024-04-02 ENCOUNTER — Encounter: Payer: Self-pay | Admitting: Physician Assistant

## 2024-04-02 ENCOUNTER — Ambulatory Visit: Admitting: Physician Assistant

## 2024-04-02 VITALS — BP 112/64 | HR 80 | Ht 64.0 in | Wt 147.2 lb

## 2024-04-02 DIAGNOSIS — R09A2 Foreign body sensation, throat: Secondary | ICD-10-CM

## 2024-04-02 DIAGNOSIS — R1310 Dysphagia, unspecified: Secondary | ICD-10-CM

## 2024-04-02 DIAGNOSIS — K219 Gastro-esophageal reflux disease without esophagitis: Secondary | ICD-10-CM

## 2024-04-02 NOTE — Patient Instructions (Addendum)
 - Continue Omeprazole  40 mg daily for Acid Reflux.  Take it 30 minutes before breakfast. - Please start Over the Counter Pepcid  (Famotidine ) 20mg  1 or 2 tablets before bedtime. - Recommend Lifestyle Modifications to prevent Acid Reflux.  Rec. Avoid coffee, sodas, peppermint, garlic, onions, alcohol, citrus fruits, chocolate, tomatoes, fatty and spicey foods.  Avoid eating 2-3 hours before bedtime.    You have been scheduled for a Barium Esophogram at [facility] (1st floor of the hospital) on Wednesday, 04/03/24 at 11:00 am. Please arrive 15 minutes prior to your appointment for registration. Make certain not to have anything to eat or drink 3 hours prior to your test. If you need to reschedule for any reason, please contact radiology at [phone] to do so. __________________________________________________________________ A barium swallow is an examination that concentrates on views of the esophagus. This tends to be a double contrast exam (barium and two liquids which, when combined, create a gas to distend the wall of the oesophagus) or single contrast (non-ionic iodine  based). The study is usually tailored to your symptoms so a good history is essential. Attention is paid during the study to the form, structure and configuration of the esophagus, looking for functional disorders (such as aspiration, dysphagia, achalasia, motility and reflux) EXAMINATION You may be asked to change into a gown, depending on the type of swallow being performed. A radiologist and radiographer will perform the procedure. The radiologist will advise you of the type of contrast selected for your procedure and direct you during the exam. You will be asked to stand, sit or lie in several different positions and to hold a small amount of fluid in your mouth before being asked to swallow while the imaging is performed .In some instances you may be asked to swallow barium coated marshmallows to assess the  motility of a solid food bolus. The exam can be recorded as a digital or video fluoroscopy procedure. POST PROCEDURE It will take 1-2 days for the barium to pass through your system. To facilitate this, it is important, unless otherwise directed, to increase your fluids for the next 24-48hrs and to resume your normal diet.  This test typically takes about 30 minutes to perform. _______________________________________________________________________  Kristin Kramer have been scheduled for an endoscopy. Please follow written instructions given to you at your visit today.  If you use inhalers (even only as needed), please bring them with you on the day of your procedure.  If you take any of the following medications, they will need to be adjusted prior to your procedure:   DO NOT TAKE 7 DAYS PRIOR TO TEST- Trulicity (dulaglutide) Ozempic, Wegovy (semaglutide) Mounjaro, Zepbound (tirzepatide) Bydureon Bcise (exanatide extended release)  DO NOT TAKE 1 DAY PRIOR TO YOUR TEST Rybelsus (semaglutide) Adlyxin (lixisenatide) Victoza (liraglutide) Byetta (exanatide) ___________________________________________________________________________   Thank you for trusting me with your gastrointestinal care!   Ellouise Console, PA-C  _______________________________________________________  If your blood pressure at your visit was 140/90 or greater, please contact your primary care physician to follow up on this.  _______________________________________________________  If you are age 84 or older, your body mass index should be between 23-30. Your Body mass index is 25.28 kg/m. If this is out of the aforementioned range listed, please consider follow up with your Primary Care Provider.  If you are age 84 or younger, your body mass index should be between 19-25. Your Body mass index is 25.28 kg/m. If this is out of the aformentioned range listed, please consider follow  up with your Primary Care Provider.    ________________________________________________________  The [facility] providers would like to encourage you to use MYCHART to communicate with providers for non-urgent requests or questions.  Due to long hold times on the telephone, sending your provider a message by [facility] may be a faster and more efficient way to get a response.  Please allow 48 business hours for a response.  Please remember that this is for non-urgent requests.  _______________________________________________________  [facility] is using a team-based approach to care.  Your team is made up of your doctor and two to three APPS. Our APPS (Nurse Practitioners and Physician Assistants) work with your physician to ensure care continuity for you. They are fully qualified to address your health concerns and develop a treatment plan. They communicate directly with your gastroenterologist to care for you. Seeing the Advanced Practice Practitioners on your physician's team can help you by facilitating care more promptly, often allowing for earlier appointments, access to diagnostic testing, procedures, and other specialty referrals.

## 2024-04-02 NOTE — Progress Notes (Signed)
 "     Kristin Console, PA-C [address] [place], [place]  [address] Phone: [phone]   Gastroenterology Consultation  Referring Provider:     Rilla Baller, MD Primary Care Physician:  Rilla Baller, MD Primary Gastroenterologist:  Kristin Console, PA-C / Glendia Holt, MD  Reason for Consultation:     GERD, Dysphagia        HPI:   Discussed the use of AI scribe software for clinical note transcription with the patient, who gave verbal consent to proceed.  Previous patient of Dr. Aneita.  Is referred from her primary care to evaluate GERD and dysphagia.  No previous EGD or Barium Swallow Test.  History of Present Illness Oropharyngeal and GERD Symptoms: - Persistent unpleasant taste in the mouth upon waking - Sensation of phlegm in the throat that cannot be cleared - Constant globus sensation in the throat, unrelieved by swallowing or expectoration - Frequent episodes of burping up a sour taste - Symptoms are worse at night, occasionally requiring her to sit up in bed, but also occur during the day  Dysphagia: - Intermittent difficulty swallowing solid foods including chicken, bread, and meat, as well as pills - Pills sometimes feel stuck in the mid-upper chest and won't go any further - Discontinued fish oil due to large pill size causing sensation of obstruction - Similar difficulty occurs with other pills  Dietary and Lifestyle Modifications: - Last meal consumed around 5 PM - Reduction of sweets and other dietary triggers - Avoidance of sodas and fizzy drinks - Coffee limited to two cups in the morning; more than two cups worsens symptoms - Avoidance of citrus fruits, juices, and tomato-based foods  Pharmacologic Therapy and Adverse Effects: - Nexium  40 mg once daily discontinued after three weeks due to severe constipation and abdominal pain - Currently taking omeprazole  40 mg in the morning on an empty stomach - Previous trials of antacids and H2  blockers without benefit - Famotidine  and Pepcid  are not used  Bowel Function: - Bowel movements are usually good when not taking Nexium   Colorectal Screening: - Colonoscopy performed in 2022 with recommendation for no further colonoscopies  She has had 3 previous colonoscopies done by Dr. Aneita in 2009, 2016, and 06/2020.  Has personal history of adenomatous colon polyps.  06/2020 last colonoscopy by Dr. Aneita: 3 small (6 mm to 7 mm) tubular adenoma polyps and 1 colon mucosal polyp removed.  Excellent prep.  No repeat colonoscopy due to advanced age.    Past Medical History:  Diagnosis Date   Abdominal pain 12/2012   [facility] ER - mesenteric adenitis 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 left axis deviation;  b. 12/2011 Echo: EF 55-60%, no rwma, pasp .   Abnormal EKG    Chronic LAFB, incomplete RBBB Has been previously cleared by cardiology for surgery Fl[place]ine 2013, 2015)   Atherosclerosis of aorta 04/18/2022   By CT scan 04/2022     Basal cell carcinoma 02/2021   R nose tip (Lomax), L lateral forearm and R nasal bridge (03/2023 -Stinehelfer), R anterior tibia Suann) 02/2024   Cervical neck pain with evidence of disc disease 08/05/2016   Last Assessment & Plan:  Ongoing cervical neck pain after fall. NSAIDs effective for relief. She has started seeing chiropractor. Reviewed ER workup including CT showing evidence of bulging disc and disc space loss.   Chronic nasal congestion 04/19/2017   Ongoing since sinus surgery 05/2016  S/p ENT  and allergist eval.    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 Recovering well from this.   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   S/p bilateral knee replacement  Formatting of this note might be different from the original. 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      Seasonal allergies    cats, dust, mold, roaches   Squamous cell carcinoma of skin    Recurrent R tibia 12/2018, 03/2019  On niacinamide  preventatively per derm     Squamous cell skin cancer    multiple - R anterior leg (Dr. Whitworth/Lomax/Stinehelfer), R  tibia x5+, L tibia x3, L dorsal hand   TMJ tenderness, right 02/08/2018   Transfusion of blood product declined due to religious reason    Tubular adenoma of colon 08/2014   Unintended weight loss 03/28/2022   Urinary, incontinence, stress female 09/10/2021   Varicose veins of both lower extremities with pain 05/11/2020   Vitamin D  deficiency     Past Surgical History:  Procedure Laterality Date   AUGMENTATION MAMMAPLASTY     BREAST ENHANCEMENT SURGERY     BUNIONECTOMY  2013   R and L foot   CATARACT EXTRACTION Bilateral 2013   R, pending L   COLONOSCOPY  2007   no records received   COLONOSCOPY  08/2014   tubular adenoma, rpt 5 yrs Oma)   COLONOSCOPY  06/2020   4 polyps removed, TA, no rpt recommended Oma)   CONVERSION TO TOTAL HIP Right 12/07/2017   Procedure: CONVERSION TO TOTAL HIP ( REMOVING SMITH AND NEPHEW NAIL);  Surgeon: Kathlynn Sharper, MD;  Location: [facility];  Service: Orthopedics;  Laterality: Right;   COSMETIC SURGERY  2002   face lift   dexa  06/2008   T -1.4 spine and hip   ETHMOIDECTOMY Bilateral 05/19/2016   Procedure: ETHMOIDECTOMY;  Surgeon: Carolee Hunter, MD;  Location: [facility] OR[facility];  Service: ENT;  Laterality: Bilateral;   EYE SURGERY     bilateral cataract surgery   FRONTAL SINUS EXPLORATION Bilateral 05/19/2016   Procedure: FRONTAL SINUS EXPLORATION;  Surgeon: Carolee Hunter, MD;  Location: [facility] OR[facility];  Service: ENT;  Laterality: Bilateral;   INTRAMEDULLARY (IM) NAIL INTERTROCHANTERIC Right 07/08/2017   Procedure: RIGHT INTERTROCHANTRIC IM NAIL;  Surgeon: Jerri Kay HERO, MD;  Location: [facility];  Service: Orthopedics;  Laterality: Right;   MAXILLARY ANTROSTOMY Bilateral 05/19/2016   Procedure: MAXILLARY ANTROSTOMY;  Surgeon: Carolee Hunter, MD;  Location: [facility];  Service: ENT;  Laterality: Bilateral;   MOHS SURGERY  2011   R leg   PARTIAL HYSTERECTOMY  1980   fibroids, ovaries remained   SINUS ENDO W/FUSION N/A 05/19/2016   Procedure:  ENDOSCOPIC SINUS SURGERY WITH NAVIGATION;  Surgeon: Carolee Hunter, MD;  Location: [facility];  Service: ENT;  Laterality: N/A;   SPHENOIDECTOMY Bilateral 05/19/2016   Procedure: CLEONE;  Surgeon: Carolee Hunter, MD;  Location: [facility];  Service: ENT;  Laterality: Bilateral;   SQUAMOUS CELL CARCINOMA EXCISION Right 07/13/2016   Dr. Leita Forbes, [facility]   TONSILLECTOMY AND ADENOIDECTOMY  1945   TOTAL HIP REVISION Right 08/07/2019   Procedure: Right hip acetabular versus total hip arthroplasty revision-posterior;  Surgeon: Melodi Lerner, MD;  Location: [facility];  Service: Orthopedics;  Laterality: Right;    TOTAL KNEE ARTHROPLASTY  01/02/2012   Right - Surgeon: Lamar DELENA Millman, MD   TOTAL KNEE ARTHROPLASTY Left 03/18/2013   Surgeon: Lamar DELENA Millman, MD    Prior to Admission medications  Medication Sig Start Date End Date Taking? Authorizing Provider  aspirin  EC 81 MG tablet Take 81 mg by mouth daily. Swallow whole. 05/11/20  Yes Rilla Baller, MD  busPIRone  (BUSPAR ) 5 MG tablet Take 1 tablet (5 mg total) by mouth 2 (two) times daily. 09/15/23  Yes Rilla Baller, MD  calcium  carbonate (OSCAL) 1500 (600 Ca) MG TABS tablet Take 600 mg of elemental calcium  by mouth daily with breakfast.   Yes [provider]  Cholecalciferol  (VITAMIN D -3) 5000 units TABS Take 5,000 Units by mouth daily.    Yes [provider]  cyanocobalamin  (VITAMIN B12) 1000 MCG tablet Take 1 tablet (1,000 mcg total) by mouth every Monday, Wednesday, and Friday. 04/04/22  Yes Rilla Baller, MD  diclofenac  (VOLTAREN ) 75 MG EC tablet Take 75 mg by mouth as needed.   Yes [provider]  ezetimibe  (ZETIA ) 10 MG tablet Take 1 tablet (10 mg total) by mouth daily. 09/15/23  Yes Rilla Baller, MD  famotidine  (PEPCID ) 20 MG tablet Take 20 mg by mouth daily as needed for heartburn or indigestion.   Yes [provider]  fexofenadine  (ALLEGRA  ALLERGY) 180 MG  tablet Take 1 tablet (180 mg total) by mouth daily. Patient taking differently: Take 180 mg by mouth daily. Takes prn 03/10/20  Yes Rilla Baller, MD  fluorouracil (EFUDEX) 5 % cream Apply 1 Application topically as needed. 03/26/24  Yes [provider]  hydrochlorothiazide  (MICROZIDE ) 12.5 MG capsule Take 1 capsule (12.5 mg total) by mouth daily. 09/15/23 04/02/24 Yes Rilla Baller, MD  levothyroxine  (SYNTHROID ) 50 MCG tablet Take 1 tablet (50 mcg total) by mouth daily before breakfast. 09/15/23  Yes Rilla Baller, MD  losartan  (COZAAR ) 25 MG  tablet TAKE 1 TABLET (25 MG TOTAL) BY MOUTH DAILY. 06/26/23 04/02/24 Yes Gollan, Timothy J, MD  Magnesium  250 MG TABS Take 1 tablet (250 mg total) by mouth daily. 09/10/21  Yes Rilla Baller, MD  methylcellulose (CITRUCEL) oral powder Take 1 packet by mouth daily as needed (constipation). 09/13/22  Yes Rilla Baller, MD  niacinamide  500 MG tablet Take 1 tablet (500 mg total) by mouth daily. 08/15/18  Yes Rilla Baller, MD  polyethylene glycol powder (GLYCOLAX /MIRALAX ) 17 GM/SCOOP powder Take 17 g by mouth as needed.   Yes [provider]  Potassium 99 MG TABS Take 1 tablet (99 mg total) by mouth daily. 05/11/20  Yes Rilla Baller, MD  Probiotic Product (PROBIOTIC PO) Take 1 capsule by mouth daily.    Yes [provider]  RESTASIS 0.05 % ophthalmic emulsion Place 1 drop into both eyes 2 (two) times daily.   Yes [provider]  sertraline  (ZOLOFT ) 25 MG tablet Take 1 tablet (25 mg total) by mouth at bedtime. 09/15/23  Yes Rilla Baller, MD  TURMERIC PO Take 900 mg by mouth daily.    Yes [provider]      Family History  Problem Relation Age of Onset   Lung cancer Mother 57       smoker   Dementia Mother    Alzheimer's disease Mother        onset likely in mid 34s   Lung cancer Father        smoker   Leukemia Maternal Grandmother 48   Heart failure Maternal Grandfather    Coronary artery  disease Paternal Grandmother 68       sudden cardiac death   Hypertension Son    Diabetes Neg Hx    Stroke Neg Hx    Breast cancer Neg Hx    Colon cancer Neg Hx    Esophageal cancer Neg Hx    Rectal cancer Neg Hx    Stomach cancer Neg Hx      Social History[1]  Allergies as of 04/02/2024 - Review Complete 04/02/2024  Allergen Reaction Noted   Blood-group specific substance  07/29/2019   Lipitor [atorvastatin ] Other (See Comments) 07/13/2012   Pravastatin  Other (See Comments) 01/03/2013    Review of Systems:    All systems reviewed and negative except where noted in HPI.   Physical Exam:  BP 112/64 (BP Location: Left Arm, Patient Position: Sitting, Cuff Size: Normal)   Pulse 80   Ht 5' 4 (1.626 m) Comment: height measured without shoes  Wt 147 lb 4 oz (66.8 kg)   BMI 25.28 kg/m  No LMP recorded. Patient has had a hysterectomy.  General:   Alert,  Well-developed, well-nourished, pleasant and cooperative in NAD Lungs:  Respirations even and unlabored.  Clear throughout to auscultation.   No wheezes, crackles, or rhonchi. No acute distress. Heart:  Regular rate and rhythm; no murmurs, clicks, rubs, or gallops. Abdomen:  Normal bowel sounds.  No bruits.  Soft, and non-distended without masses, hepatosplenomegaly or hernias noted.  No Tenderness.  No guarding or rebound tenderness.    Neurologic:  Alert and oriented x3;  grossly normal neurologically. Psych:  Alert and cooperative. Normal mood and affect.   Imaging Studies: No results found.  Labs: CBC    Component Value Date/Time   WBC 5.6 09/11/2023 0808   RBC 4.41 09/11/2023 0808   HGB 12.2 09/11/2023 0808   HGB 13.9 12/31/2012 0516   HCT 36.9 09/11/2023 0808   HCT 41.1  12/31/2012 0516   PLT 253.0 09/11/2023 0808   PLT 280 12/31/2012 0516   MCV 83.7 09/11/2023 0808   MCV 88 12/31/2012 0516    CMP     Component Value Date/Time   NA 140 09/11/2023 0808   NA 141 12/31/2012 0516   K 4.3 09/11/2023 0808   K  4.2 12/31/2012 0516   CL 102 09/11/2023 0808   CL 107 12/31/2012 0516   CO2 30 09/11/2023 0808   CO2 30 12/31/2012 0516   GLUCOSE 97 09/11/2023 0808   GLUCOSE 96 12/31/2012 0516   BUN 22 09/11/2023 0808   BUN 21 (H) 12/31/2012 0516   CREATININE 0.76 09/11/2023 0808   CREATININE 0.95 12/31/2012 0516   CREATININE 0.7 06/24/2011 0000   CALCIUM  9.6 09/11/2023 0808   CALCIUM  9.4 12/31/2012 0516   PROT 6.6 09/11/2023 0808   PROT 7.6 12/31/2012 0516   ALBUMIN 4.2 09/11/2023 0808   ALBUMIN 4.0 12/31/2012 0516   AST 19 09/11/2023 0808   AST 103 (H) 12/31/2012 0516   AST 18 06/24/2011 0000   ALT 14 09/11/2023 0808   ALT 109 (H) 12/31/2012 0516   ALKPHOS 91 09/11/2023 0808   ALKPHOS 151 (H) 12/31/2012 0516   ALKPHOS 146 06/24/2011 0000   BILITOT 0.5 09/11/2023 0808   BILITOT 0.3 12/31/2012 0516   BILITOT 0.5 06/24/2011 0000   GFRNONAA >60 08/08/2019 0240   GFRNONAA >60 12/31/2012 0516   GFRAA >60 08/08/2019 0240   GFRAA >60 12/31/2012 0516    Assessment and Plan:   Jenkins Elbert Needle is a 84 y.o. y/o female has been referred for:   1.  GERD / Globus Sensation - Continue Omeprazole  40 mg daily - Recommend Lifestyle Modifications to prevent Acid Reflux.  Rec. Avoid coffee, sodas, peppermint, garlic, onions, alcohol, citrus fruits, chocolate, tomatoes, fatty and spicey foods.  Avoid eating 2-3 hours before bedtime.   - Start OTC Pepcid  (famotidine ) 20 mg 1 or 2 tablets before bedtime.  2.  Dysphagia to solid foods and Pills - Schedule barium swallow tablet. - Scheduling EGD with Dilation with Dr. Stacia I discussed risks of EGD with Dilaiton with patient to include risk of bleeding, perforation, and risk of sedation.  Patient expressed understanding and agrees to proceed with EGD w/ DIL.     Follow up 4 weeks after EGD with TG.  Kristin Console, PA-C       [1]  Social History Tobacco Use   Smoking status: Never   Smokeless tobacco: Never  Vaping Use   Vaping status:  Never Used  Substance Use Topics   Alcohol use: Yes    Comment: occasional   Drug use: No   "

## 2024-04-03 ENCOUNTER — Other Ambulatory Visit: Payer: Self-pay | Admitting: Physician Assistant

## 2024-04-03 ENCOUNTER — Ambulatory Visit
Admission: RE | Admit: 2024-04-03 | Discharge: 2024-04-03 | Disposition: A | Discharge status: Home / Self Care | Source: Ambulatory Visit | Attending: Physician Assistant | Admitting: Physician Assistant

## 2024-04-03 ENCOUNTER — Ambulatory Visit: Payer: Self-pay | Admitting: Physician Assistant

## 2024-04-03 DIAGNOSIS — K219 Gastro-esophageal reflux disease without esophagitis: Secondary | ICD-10-CM

## 2024-04-03 DIAGNOSIS — R1310 Dysphagia, unspecified: Secondary | ICD-10-CM

## 2024-04-03 NOTE — Progress Notes (Signed)
 Agree with the assessment and plan as outlined by Brigitte Canard, PA-C.

## 2024-04-03 NOTE — Progress Notes (Signed)
 Call and notify patient barium swallow test shows: 1.  Normal esophagus.  No evidence of esophageal stricture or masses. 2.  No hiatal hernia. 3.  Mild esophageal dysmotility (abnormal muscle contraction in the esophagus).  I recommend: 1.  Continue with plan to treat acid reflux.  Take omeprazole  40 mg once daily in the morning before breakfast.  Take OTC Pepcid  (famotidine ) 20 mg 1 or 2 tablets before bedtime. 2.  Continue with plan for upper endoscopy procedure as scheduled. Ellouise Console, PA-C

## 2024-04-09 ENCOUNTER — Other Ambulatory Visit: Payer: Self-pay | Admitting: Family Medicine

## 2024-04-10 NOTE — Telephone Encounter (Signed)
 Declined as GI prescribed omeprazole 

## 2024-04-25 ENCOUNTER — Encounter: Admitting: Gastroenterology

## 2024-05-15 ENCOUNTER — Ambulatory Visit: Admitting: Physician Assistant

## 2024-08-20 ENCOUNTER — Ambulatory Visit

## 2024-09-11 ENCOUNTER — Other Ambulatory Visit

## 2024-09-18 ENCOUNTER — Encounter: Admitting: Family Medicine
